# Patient Record
Sex: Male | Born: 1965 | Race: White | Hispanic: No | Marital: Single | Smoking: Never smoker
Health system: Southern US, Community
[De-identification: ages and names within clinical notes are randomized; demographics above are authoritative.]

## PROBLEM LIST (undated history)

## (undated) DIAGNOSIS — I5042 Chronic combined systolic (congestive) and diastolic (congestive) heart failure: Secondary | ICD-10-CM

## (undated) DIAGNOSIS — J90 Pleural effusion, not elsewhere classified: Secondary | ICD-10-CM

## (undated) DIAGNOSIS — K7460 Unspecified cirrhosis of liver: Secondary | ICD-10-CM

## (undated) DIAGNOSIS — I350 Nonrheumatic aortic (valve) stenosis: Secondary | ICD-10-CM

## (undated) DIAGNOSIS — R188 Other ascites: Secondary | ICD-10-CM

## (undated) HISTORY — PX: CARDIAC CATHETERIZATION: SHX172

## (undated) HISTORY — DX: Nonrheumatic aortic (valve) stenosis: I35.0

## (undated) HISTORY — PX: WRIST FRACTURE SURGERY: SHX121

## (undated) HISTORY — PX: WISDOM TOOTH EXTRACTION: SHX21

---

## 2011-09-24 ENCOUNTER — Ambulatory Visit: Payer: Self-pay | Admitting: Family Medicine

## 2011-09-24 VITALS — BP 115/77 | HR 72 | Temp 97.6°F | Resp 16 | Ht 71.0 in | Wt 276.0 lb

## 2011-09-24 DIAGNOSIS — Z0000 Encounter for general adult medical examination without abnormal findings: Secondary | ICD-10-CM

## 2011-09-24 DIAGNOSIS — Z0289 Encounter for other administrative examinations: Secondary | ICD-10-CM

## 2011-09-24 NOTE — Progress Notes (Signed)
@[email or web address] @  Patient ID: Steve Richards MRN: [id number], DOB: 1966/01/06 46 y.o. Date of Encounter: 09/24/2011, 7:15 PM  Primary Physician: No primary provider on file.  Chief Complaint: Physical (CPE)  HPI: 46 y.o. y/o male with history noted below here for CPE.  Doing well. No issues/complaints.  Review of Systems: Consitutional: No fever, chills, fatigue, night sweats, lymphadenopathy, or weight changes. Eyes: No visual changes, eye redness, or discharge. ENT/Mouth: Ears: No otalgia, tinnitus, hearing loss, discharge. Nose: No congestion, rhinorrhea, sinus pain, or epistaxis. Throat: No sore throat, post nasal drip, or teeth pain. Cardiovascular: No CP, palpitations, diaphoresis, DOE, edema, orthopnea, PND. Respiratory: No cough, hemoptysis, SOB, or wheezing. Gastrointestinal: No anorexia, dysphagia, reflux, pain, nausea, vomiting, hematemesis, diarrhea, constipation, BRBPR, or melena. Genitourinary: No dysuria, frequency, urgency, hematuria, incontinence, nocturia, decreased urinary stream, discharge, impotence, or testicular pain/masses. Musculoskeletal: No decreased ROM, myalgias, stiffness, joint swelling, or weakness. Skin: No rash, erythema, lesion changes, pain, warmth, jaundice, or pruritis. Neurological: No headache, dizziness, syncope, seizures, tremors, memory loss, coordination problems, or paresthesias. Psychological: No anxiety, depression, hallucinations, SI/HI. Endocrine: No fatigue, polydipsia, polyphagia, polyuria, or known diabetes. All other systems were reviewed and are otherwise negative.  No past medical history on file.   No past surgical history on file.  Home Meds:  Prior to Admission medications   Medication Sig Start Date End Date Taking? Authorizing Provider  Multiple Vitamin (MULTIVITAMIN) tablet Take 1 tablet by mouth daily.   Yes Historical Provider, MD    Allergies: No Known Allergies  History   Social History  . Marital Status: Single   Spouse Name: N/A    Number of Children: N/A  . Years of Education: N/A   Occupational History  . Not on file.   Social History Main Topics  . Smoking status: Never Smoker   . Smokeless tobacco: Not on file  . Alcohol Use: Not on file  . Drug Use: Not on file  . Sexually Active: Not on file   Other Topics Concern  . Not on file   Social History Narrative  . No narrative on file    No family history on file.  Physical Exam: Blood pressure 115/77, pulse 72, temperature 97.6 F (36.4 C), temperature source Oral, resp. rate 16, height 5' 11"  (1.803 m), weight 276 lb (125.193 kg), SpO2 95.00%.  General: Well developed, well nourished, in no acute distress. HEENT: Normocephalic, atraumatic. Conjunctiva pink, sclera non-icteric. Pupils 2 mm constricting to 1 mm, round, regular, and equally reactive to light and accomodation. EOMI. Internal auditory canal clear. TMs with good cone of light and without pathology. Nasal mucosa pink. Nares are without discharge. No sinus tenderness. Oral mucosa pink. Dentition good. Pharynx without exudate.   Neck: Supple. Trachea midline. No thyromegaly. Full ROM. No lymphadenopathy. Lungs: Clear to auscultation bilaterally without wheezes, rales, or rhonchi. Breathing is of normal effort and unlabored. Cardiovascular: RRR with S1 S2. No murmurs, rubs, or gallops appreciated. Distal pulses 2+ symmetrically. No carotid or abdominal bruits. Abdomen: Soft, non-tender, non-distended with normoactive bowel sounds. No hepatosplenomegaly or masses. No rebound/guarding. No CVA tenderness. Without hernias.   Genitourinary:  circumcised male. No penile lesions. Testes descended bilaterally, and smooth without tenderness or masses.  Musculoskeletal: Full range of motion and 5/5 strength throughout. Without swelling, atrophy, tenderness, crepitus, or warmth. Extremities without clubbing, cyanosis, or edema. Calves supple. Skin: Warm and moist without erythema,  ecchymosis, wounds, or rash. Neuro: A+Ox3. CN II-XII grossly intact. Moves all extremities spontaneously. Full sensation throughout. Normal  gait. DTR 2+ throughout upper and lower extremities. Finger to nose intact. Psych:  Responds to questions appropriately with a normal affect.   No results found for this or any previous visit.   Assessment/Plan:  Overweight, otherwise normal.  47 y.o. y/o health male here for CPE -  Signed, [name], MD 09/24/2011 7:15 PM

## 2012-05-19 ENCOUNTER — Ambulatory Visit: Payer: Self-pay | Admitting: Family Medicine

## 2019-06-17 ENCOUNTER — Other Ambulatory Visit: Payer: Self-pay | Admitting: Surgery

## 2019-06-17 ENCOUNTER — Other Ambulatory Visit (HOSPITAL_COMMUNITY): Payer: Self-pay | Admitting: Surgery

## 2019-06-17 DIAGNOSIS — J90 Pleural effusion, not elsewhere classified: Secondary | ICD-10-CM

## 2019-06-23 ENCOUNTER — Other Ambulatory Visit (HOSPITAL_COMMUNITY)
Admission: RE | Admit: 2019-06-23 | Discharge: 2019-06-23 | Disposition: A | Discharge status: Home / Self Care | Payer: 59 | Source: Ambulatory Visit | Attending: Surgery | Admitting: Surgery

## 2019-06-23 DIAGNOSIS — Z20822 Contact with and (suspected) exposure to covid-19: Secondary | ICD-10-CM | POA: Insufficient documentation

## 2019-06-23 DIAGNOSIS — Z01812 Encounter for preprocedural laboratory examination: Secondary | ICD-10-CM | POA: Insufficient documentation

## 2019-06-23 LAB — SARS CORONAVIRUS 2 (TAT 6-24 HRS): SARS Coronavirus 2: NEGATIVE

## 2019-06-25 ENCOUNTER — Other Ambulatory Visit: Payer: Self-pay

## 2019-06-25 ENCOUNTER — Other Ambulatory Visit (HOSPITAL_COMMUNITY): Payer: Self-pay | Admitting: Radiology

## 2019-06-25 ENCOUNTER — Ambulatory Visit (HOSPITAL_COMMUNITY)
Admission: RE | Admit: 2019-06-25 | Discharge: 2019-06-25 | Disposition: A | Discharge status: Home / Self Care | Payer: 59 | Source: Ambulatory Visit | Attending: Radiology | Admitting: Radiology

## 2019-06-25 ENCOUNTER — Ambulatory Visit (HOSPITAL_COMMUNITY)
Admission: RE | Admit: 2019-06-25 | Discharge: 2019-06-25 | Disposition: A | Discharge status: Home / Self Care | Payer: 59 | Source: Ambulatory Visit | Attending: Surgery | Admitting: Surgery

## 2019-06-25 DIAGNOSIS — J90 Pleural effusion, not elsewhere classified: Secondary | ICD-10-CM | POA: Diagnosis present

## 2019-06-25 HISTORY — PX: IR THORACENTESIS ASP PLEURAL SPACE W/IMG GUIDE: IMG5380

## 2019-06-25 LAB — GRAM STAIN

## 2019-06-25 LAB — LACTATE DEHYDROGENASE, PLEURAL OR PERITONEAL FLUID: LD, Fluid: 71 U/L — ABNORMAL HIGH (ref 3–23)

## 2019-06-25 LAB — BODY FLUID CELL COUNT WITH DIFFERENTIAL
Eos, Fluid: 0 %
Lymphs, Fluid: 45 %
Monocyte-Macrophage-Serous Fluid: 35 % — ABNORMAL LOW (ref 50–90)
Neutrophil Count, Fluid: 19 % (ref 0–25)
Total Nucleated Cell Count, Fluid: 100 cu mm (ref 0–1000)

## 2019-06-25 LAB — GLUCOSE, PLEURAL OR PERITONEAL FLUID: Glucose, Fluid: 147 mg/dL

## 2019-06-25 LAB — PROTEIN, PLEURAL OR PERITONEAL FLUID: Total protein, fluid: <3 g/dL

## 2019-06-25 MED ORDER — LIDOCAINE HCL 1 % IJ SOLN
INTRAMUSCULAR | Status: DC | PRN
  Administered 2019-06-25: 10 mL

## 2019-06-25 MED ORDER — LIDOCAINE HCL 1 % IJ SOLN
INTRAMUSCULAR | Status: AC
  Filled 2019-06-25: qty 20

## 2019-06-25 NOTE — Procedures (Signed)
Ultrasound-guided diagnostic and therapeutic right thoracentesis performed yielding 1.5 liters of yellow fluid. No immediate complications. Follow-up chest x-ray pending. A portion of the fluid was sent to the lab for preordered studies. EBL none.

## 2019-06-25 NOTE — Sedation Documentation (Signed)
Mr Dibello came to radiology today to have right thoracentesis.  Labs on pleural fluid sent to lab under the name of peritoneal fluid, I attempted to change and cancel labs to name correctly.  Lab eventually had to manually change under their epic screen.  Labs should read as pleural fluid.  jkc

## 2019-06-26 LAB — CYTOLOGY - NON PAP

## 2019-06-30 LAB — CULTURE, BODY FLUID W GRAM STAIN -BOTTLE: Culture: NO GROWTH

## 2019-07-15 ENCOUNTER — Encounter: Payer: Self-pay | Admitting: Surgery

## 2019-07-15 DIAGNOSIS — K7460 Unspecified cirrhosis of liver: Secondary | ICD-10-CM

## 2019-07-15 DIAGNOSIS — R188 Other ascites: Secondary | ICD-10-CM

## 2019-07-15 DIAGNOSIS — J90 Pleural effusion, not elsewhere classified: Secondary | ICD-10-CM

## 2019-07-15 DIAGNOSIS — R011 Cardiac murmur, unspecified: Secondary | ICD-10-CM

## 2019-07-15 DIAGNOSIS — R161 Splenomegaly, not elsewhere classified: Secondary | ICD-10-CM

## 2019-07-15 DIAGNOSIS — N2889 Other specified disorders of kidney and ureter: Secondary | ICD-10-CM | POA: Insufficient documentation

## 2019-07-15 HISTORY — DX: Unspecified cirrhosis of liver: K74.60

## 2019-07-15 HISTORY — DX: Pleural effusion, not elsewhere classified: J90

## 2019-07-15 HISTORY — DX: Other ascites: R18.8

## 2019-07-15 HISTORY — DX: Other specified disorders of kidney and ureter: N28.89

## 2019-07-15 HISTORY — DX: Cardiac murmur, unspecified: R01.1

## 2019-07-15 HISTORY — DX: Splenomegaly, not elsewhere classified: R16.1

## 2019-07-21 ENCOUNTER — Inpatient Hospital Stay (HOSPITAL_COMMUNITY): Payer: 59

## 2019-07-21 ENCOUNTER — Encounter (HOSPITAL_COMMUNITY): Payer: Self-pay | Admitting: Emergency Medicine

## 2019-07-21 ENCOUNTER — Other Ambulatory Visit: Payer: Self-pay

## 2019-07-21 ENCOUNTER — Inpatient Hospital Stay (HOSPITAL_COMMUNITY)
Admission: EM | Admit: 2019-07-21 | Discharge: 2019-08-17 | DRG: 216 | Disposition: A | Discharge status: Home Health | Payer: 59 | Attending: Surgery | Admitting: Surgery

## 2019-07-21 ENCOUNTER — Emergency Department (HOSPITAL_COMMUNITY): Payer: 59

## 2019-07-21 DIAGNOSIS — Z6841 Body Mass Index (BMI) 40.0 and over, adult: Secondary | ICD-10-CM

## 2019-07-21 DIAGNOSIS — U071 COVID-19: Secondary | ICD-10-CM | POA: Diagnosis present

## 2019-07-21 DIAGNOSIS — I878 Other specified disorders of veins: Secondary | ICD-10-CM | POA: Diagnosis present

## 2019-07-21 DIAGNOSIS — R0489 Hemorrhage from other sites in respiratory passages: Secondary | ICD-10-CM | POA: Diagnosis not present

## 2019-07-21 DIAGNOSIS — I97130 Postprocedural heart failure following cardiac surgery: Secondary | ICD-10-CM

## 2019-07-21 DIAGNOSIS — Z8262 Family history of osteoporosis: Secondary | ICD-10-CM

## 2019-07-21 DIAGNOSIS — E872 Acidosis: Secondary | ICD-10-CM | POA: Diagnosis not present

## 2019-07-21 DIAGNOSIS — D688 Other specified coagulation defects: Secondary | ICD-10-CM | POA: Diagnosis not present

## 2019-07-21 DIAGNOSIS — Z951 Presence of aortocoronary bypass graft: Secondary | ICD-10-CM

## 2019-07-21 DIAGNOSIS — K7460 Unspecified cirrhosis of liver: Secondary | ICD-10-CM | POA: Diagnosis present

## 2019-07-21 DIAGNOSIS — I5030 Unspecified diastolic (congestive) heart failure: Secondary | ICD-10-CM

## 2019-07-21 DIAGNOSIS — R778 Other specified abnormalities of plasma proteins: Secondary | ICD-10-CM | POA: Diagnosis present

## 2019-07-21 DIAGNOSIS — Z953 Presence of xenogenic heart valve: Secondary | ICD-10-CM

## 2019-07-21 DIAGNOSIS — Z01810 Encounter for preprocedural cardiovascular examination: Secondary | ICD-10-CM | POA: Diagnosis not present

## 2019-07-21 DIAGNOSIS — I350 Nonrheumatic aortic (valve) stenosis: Principal | ICD-10-CM

## 2019-07-21 DIAGNOSIS — R739 Hyperglycemia, unspecified: Secondary | ICD-10-CM | POA: Diagnosis present

## 2019-07-21 DIAGNOSIS — E1165 Type 2 diabetes mellitus with hyperglycemia: Secondary | ICD-10-CM | POA: Diagnosis present

## 2019-07-21 DIAGNOSIS — D62 Acute posthemorrhagic anemia: Secondary | ICD-10-CM | POA: Diagnosis not present

## 2019-07-21 DIAGNOSIS — L03116 Cellulitis of left lower limb: Secondary | ICD-10-CM | POA: Diagnosis present

## 2019-07-21 DIAGNOSIS — I959 Hypotension, unspecified: Secondary | ICD-10-CM | POA: Diagnosis not present

## 2019-07-21 DIAGNOSIS — I5042 Chronic combined systolic (congestive) and diastolic (congestive) heart failure: Secondary | ICD-10-CM | POA: Diagnosis not present

## 2019-07-21 DIAGNOSIS — Z79899 Other long term (current) drug therapy: Secondary | ICD-10-CM

## 2019-07-21 DIAGNOSIS — I5021 Acute systolic (congestive) heart failure: Secondary | ICD-10-CM | POA: Diagnosis not present

## 2019-07-21 DIAGNOSIS — I429 Cardiomyopathy, unspecified: Secondary | ICD-10-CM | POA: Diagnosis present

## 2019-07-21 DIAGNOSIS — E039 Hypothyroidism, unspecified: Secondary | ICD-10-CM | POA: Diagnosis present

## 2019-07-21 DIAGNOSIS — I272 Pulmonary hypertension, unspecified: Secondary | ICD-10-CM | POA: Diagnosis present

## 2019-07-21 DIAGNOSIS — I9789 Other postprocedural complications and disorders of the circulatory system, not elsewhere classified: Secondary | ICD-10-CM | POA: Diagnosis not present

## 2019-07-21 DIAGNOSIS — I872 Venous insufficiency (chronic) (peripheral): Secondary | ICD-10-CM | POA: Diagnosis present

## 2019-07-21 DIAGNOSIS — R609 Edema, unspecified: Secondary | ICD-10-CM | POA: Diagnosis not present

## 2019-07-21 DIAGNOSIS — I509 Heart failure, unspecified: Secondary | ICD-10-CM

## 2019-07-21 DIAGNOSIS — R7989 Other specified abnormal findings of blood chemistry: Secondary | ICD-10-CM | POA: Diagnosis present

## 2019-07-21 DIAGNOSIS — E6609 Other obesity due to excess calories: Secondary | ICD-10-CM | POA: Diagnosis present

## 2019-07-21 DIAGNOSIS — J939 Pneumothorax, unspecified: Secondary | ICD-10-CM

## 2019-07-21 DIAGNOSIS — R9341 Abnormal radiologic findings on diagnostic imaging of renal pelvis, ureter, or bladder: Secondary | ICD-10-CM | POA: Diagnosis present

## 2019-07-21 DIAGNOSIS — I5043 Acute on chronic combined systolic (congestive) and diastolic (congestive) heart failure: Secondary | ICD-10-CM | POA: Diagnosis present

## 2019-07-21 DIAGNOSIS — J90 Pleural effusion, not elsewhere classified: Secondary | ICD-10-CM

## 2019-07-21 DIAGNOSIS — D696 Thrombocytopenia, unspecified: Secondary | ICD-10-CM | POA: Diagnosis not present

## 2019-07-21 DIAGNOSIS — L02416 Cutaneous abscess of left lower limb: Secondary | ICD-10-CM | POA: Diagnosis present

## 2019-07-21 DIAGNOSIS — E873 Alkalosis: Secondary | ICD-10-CM | POA: Diagnosis not present

## 2019-07-21 DIAGNOSIS — Z87442 Personal history of urinary calculi: Secondary | ICD-10-CM

## 2019-07-21 DIAGNOSIS — N4889 Other specified disorders of penis: Secondary | ICD-10-CM | POA: Diagnosis present

## 2019-07-21 DIAGNOSIS — Z954 Presence of other heart-valve replacement: Secondary | ICD-10-CM | POA: Diagnosis not present

## 2019-07-21 DIAGNOSIS — N3289 Other specified disorders of bladder: Secondary | ICD-10-CM | POA: Diagnosis not present

## 2019-07-21 DIAGNOSIS — I5082 Biventricular heart failure: Secondary | ICD-10-CM | POA: Diagnosis present

## 2019-07-21 DIAGNOSIS — Z125 Encounter for screening for malignant neoplasm of prostate: Secondary | ICD-10-CM

## 2019-07-21 DIAGNOSIS — R011 Cardiac murmur, unspecified: Secondary | ICD-10-CM | POA: Diagnosis present

## 2019-07-21 DIAGNOSIS — L97928 Non-pressure chronic ulcer of unspecified part of left lower leg with other specified severity: Secondary | ICD-10-CM | POA: Diagnosis present

## 2019-07-21 DIAGNOSIS — Z952 Presence of prosthetic heart valve: Secondary | ICD-10-CM

## 2019-07-21 HISTORY — DX: Cutaneous abscess of left lower limb: L02.416

## 2019-07-21 HISTORY — DX: Pleural effusion, not elsewhere classified: J90

## 2019-07-21 HISTORY — DX: Other ascites: R18.8

## 2019-07-21 HISTORY — DX: Chronic combined systolic (congestive) and diastolic (congestive) heart failure: I50.42

## 2019-07-21 HISTORY — DX: Unspecified cirrhosis of liver: K74.60

## 2019-07-21 HISTORY — DX: Morbid (severe) obesity due to excess calories: E66.01

## 2019-07-21 HISTORY — DX: Hyperglycemia, unspecified: R73.9

## 2019-07-21 HISTORY — DX: Nonrheumatic aortic (valve) stenosis: I35.0

## 2019-07-21 LAB — LIPID PANEL
Cholesterol: 128 mg/dL (ref 0–200)
HDL: 37 mg/dL — ABNORMAL LOW (ref >40)
LDL Cholesterol: 74 mg/dL (ref 0–99)
Total CHOL/HDL Ratio: 3.5 RATIO
Triglycerides: 87 mg/dL (ref <150)
VLDL: 17 mg/dL (ref 0–40)

## 2019-07-21 LAB — D-DIMER, QUANTITATIVE: D-Dimer, Quant: 6.01 ug/mL-FEU — ABNORMAL HIGH (ref 0.00–0.50)

## 2019-07-21 LAB — CBC
HCT: 46 % (ref 39.0–52.0)
Hemoglobin: 14.5 g/dL (ref 13.0–17.0)
MCH: 29.7 pg (ref 26.0–34.0)
MCHC: 31.5 g/dL (ref 30.0–36.0)
MCV: 94.3 fL (ref 80.0–100.0)
Platelets: 225 10*3/uL (ref 150–400)
RBC: 4.88 MIL/uL (ref 4.22–5.81)
RDW: 16.5 % — ABNORMAL HIGH (ref 11.5–15.5)
WBC: 8.9 10*3/uL (ref 4.0–10.5)
nRBC: 0 % (ref 0.0–0.2)

## 2019-07-21 LAB — RAPID URINE DRUG SCREEN, HOSP PERFORMED
Amphetamines: NOT DETECTED
Barbiturates: NOT DETECTED
Benzodiazepines: NOT DETECTED
Cocaine: NOT DETECTED
Opiates: NOT DETECTED
Tetrahydrocannabinol: NOT DETECTED

## 2019-07-21 LAB — FERRITIN: Ferritin: 235 ng/mL (ref 24–336)

## 2019-07-21 LAB — BASIC METABOLIC PANEL
Anion gap: 12 (ref 5–15)
BUN: 14 mg/dL (ref 6–20)
CO2: 26 mmol/L (ref 22–32)
Calcium: 8.6 mg/dL — ABNORMAL LOW (ref 8.9–10.3)
Chloride: 98 mmol/L (ref 98–111)
Creatinine, Ser: 0.93 mg/dL (ref 0.61–1.24)
GFR calc Af Amer: >60 mL/min (ref >60)
GFR calc non Af Amer: >60 mL/min (ref >60)
Glucose, Bld: 153 mg/dL — ABNORMAL HIGH (ref 70–99)
Potassium: 4.2 mmol/L (ref 3.5–5.1)
Sodium: 136 mmol/L (ref 135–145)

## 2019-07-21 LAB — ECHOCARDIOGRAM COMPLETE
Height: 72 in
Weight: 4960 oz

## 2019-07-21 LAB — TROPONIN I (HIGH SENSITIVITY)
Troponin I (High Sensitivity): 47 ng/L — ABNORMAL HIGH (ref <18)
Troponin I (High Sensitivity): 40 ng/L — ABNORMAL HIGH (ref <18)

## 2019-07-21 LAB — C-REACTIVE PROTEIN: CRP: 9.8 mg/dL — ABNORMAL HIGH (ref <1.0)

## 2019-07-21 LAB — LACTATE DEHYDROGENASE: LDH: 317 U/L — ABNORMAL HIGH (ref 98–192)

## 2019-07-21 LAB — BRAIN NATRIURETIC PEPTIDE: B Natriuretic Peptide: 1142.6 pg/mL — ABNORMAL HIGH (ref 0.0–100.0)

## 2019-07-21 LAB — SARS CORONAVIRUS 2 BY RT PCR (HOSPITAL ORDER, PERFORMED IN ~~LOC~~ HOSPITAL LAB): SARS Coronavirus 2: POSITIVE — AB

## 2019-07-21 LAB — TSH: TSH: 14.146 u[IU]/mL — ABNORMAL HIGH (ref 0.350–4.500)

## 2019-07-21 LAB — T4, FREE: Free T4: 0.65 ng/dL (ref 0.61–1.12)

## 2019-07-21 LAB — FIBRINOGEN: Fibrinogen: 528 mg/dL — ABNORMAL HIGH (ref 210–475)

## 2019-07-21 LAB — HIV ANTIBODY (ROUTINE TESTING W REFLEX): HIV Screen 4th Generation wRfx: NONREACTIVE

## 2019-07-21 LAB — CBG MONITORING, ED
Glucose-Capillary: 88 mg/dL (ref 70–99)
Glucose-Capillary: 148 mg/dL — ABNORMAL HIGH (ref 70–99)

## 2019-07-21 LAB — LACTIC ACID, PLASMA: Lactic Acid, Venous: 1.9 mmol/L (ref 0.5–1.9)

## 2019-07-21 MED ORDER — POLYETHYLENE GLYCOL 3350 17 G PO PACK: 17.0000 g | PACK | Freq: Every day | ORAL | Status: DC | PRN

## 2019-07-21 MED ORDER — DOCUSATE SODIUM 100 MG PO CAPS
100.0000 mg | ORAL_CAPSULE | Freq: Two times a day (BID) | ORAL | Status: DC
  Administered 2019-07-21 – 2019-08-05 (×30): 100 mg via ORAL
  Filled 2019-07-21 (×31): qty 1

## 2019-07-21 MED ORDER — VANCOMYCIN HCL 1250 MG/250ML IV SOLN
1250.0000 mg | Freq: Two times a day (BID) | INTRAVENOUS | Status: DC
  Administered 2019-07-21: 1250 mg via INTRAVENOUS
  Filled 2019-07-21 (×2): qty 250

## 2019-07-21 MED ORDER — MORPHINE SULFATE (PF) 2 MG/ML IV SOLN: 2.0000 mg | INTRAVENOUS | Status: DC | PRN

## 2019-07-21 MED ORDER — ONDANSETRON HCL 4 MG/2ML IJ SOLN: 4.0000 mg | Freq: Four times a day (QID) | INTRAMUSCULAR | Status: DC | PRN

## 2019-07-21 MED ORDER — SODIUM CHLORIDE 0.9 % IV SOLN: 250.0000 mL | INTRAVENOUS | Status: DC | PRN

## 2019-07-21 MED ORDER — ONDANSETRON HCL 4 MG PO TABS: 4.0000 mg | ORAL_TABLET | Freq: Four times a day (QID) | ORAL | Status: DC | PRN

## 2019-07-21 MED ORDER — SODIUM CHLORIDE 0.9% FLUSH: 3.0000 mL | INTRAVENOUS | Status: DC | PRN

## 2019-07-21 MED ORDER — INSULIN ASPART 100 UNIT/ML ~~LOC~~ SOLN
0.0000 [IU] | Freq: Three times a day (TID) | SUBCUTANEOUS | Status: DC
  Administered 2019-07-22: 3 [IU] via SUBCUTANEOUS
  Administered 2019-07-23: 2 [IU] via SUBCUTANEOUS
  Administered 2019-07-23: 3 [IU] via SUBCUTANEOUS
  Administered 2019-07-24: 2 [IU] via SUBCUTANEOUS
  Administered 2019-07-24: 3 [IU] via SUBCUTANEOUS

## 2019-07-21 MED ORDER — ACETAMINOPHEN 325 MG PO TABS
650.0000 mg | ORAL_TABLET | Freq: Four times a day (QID) | ORAL | Status: DC | PRN
  Administered 2019-08-01: 650 mg via ORAL
  Filled 2019-07-21: qty 2

## 2019-07-21 MED ORDER — ENOXAPARIN SODIUM 80 MG/0.8ML ~~LOC~~ SOLN
70.0000 mg | SUBCUTANEOUS | Status: DC
  Administered 2019-07-21 – 2019-07-24 (×4): 70 mg via SUBCUTANEOUS
  Filled 2019-07-21: qty 0.7
  Filled 2019-07-21 (×3): qty 0.8

## 2019-07-21 MED ORDER — FUROSEMIDE 10 MG/ML IJ SOLN
40.0000 mg | Freq: Two times a day (BID) | INTRAMUSCULAR | Status: DC
  Administered 2019-07-21 – 2019-07-30 (×14): 40 mg via INTRAVENOUS
  Filled 2019-07-21 (×15): qty 4

## 2019-07-21 MED ORDER — HYDROCODONE-ACETAMINOPHEN 5-325 MG PO TABS: 1.0000 | ORAL_TABLET | ORAL | Status: DC | PRN

## 2019-07-21 MED ORDER — BISACODYL 5 MG PO TBEC: 5.0000 mg | DELAYED_RELEASE_TABLET | Freq: Every day | ORAL | Status: DC | PRN

## 2019-07-21 MED ORDER — SODIUM CHLORIDE 0.9% FLUSH
3.0000 mL | Freq: Two times a day (BID) | INTRAVENOUS | Status: DC
  Administered 2019-07-21 – 2019-07-29 (×12): 3 mL via INTRAVENOUS

## 2019-07-21 MED ORDER — VANCOMYCIN HCL 2000 MG/400ML IV SOLN
2000.0000 mg | Freq: Once | INTRAVENOUS | Status: AC
  Administered 2019-07-21: 2000 mg via INTRAVENOUS
  Filled 2019-07-21: qty 400

## 2019-07-21 MED ORDER — ASPIRIN EC 81 MG PO TBEC
81.0000 mg | DELAYED_RELEASE_TABLET | Freq: Every day | ORAL | Status: DC
  Administered 2019-07-21 – 2019-08-05 (×15): 81 mg via ORAL
  Filled 2019-07-21 (×15): qty 1

## 2019-07-21 MED ORDER — HYDRALAZINE HCL 20 MG/ML IJ SOLN: 5.0000 mg | INTRAMUSCULAR | Status: DC | PRN

## 2019-07-21 MED ORDER — FUROSEMIDE 10 MG/ML IJ SOLN
40.0000 mg | Freq: Once | INTRAMUSCULAR | Status: AC
  Administered 2019-07-21: 40 mg via INTRAVENOUS
  Filled 2019-07-21: qty 4

## 2019-07-21 MED ORDER — ACETAMINOPHEN 650 MG RE SUPP: 650.0000 mg | Freq: Four times a day (QID) | RECTAL | Status: DC | PRN

## 2019-07-21 NOTE — ED Notes (Signed)
Dinner ordered 

## 2019-07-21 NOTE — Progress Notes (Signed)
Pharmacy Antibiotic Note  Steve Richards is a 54 y.o. male admitted on 07/21/2019 with cellulitis.  Pharmacy has been consulted for vancomycin dosing.  Plan: Vancomycin 2000 mg IV x 1, then 1250 mg every 12 hours Monitor renal function, Cx and clinical progression, LOT Vancomycin trough at steady state  Height: 6' (182.9 cm) Weight: (!) 140.6 kg (310 lb) IBW/kg (Calculated) : 77.6  Temp (24hrs), Avg:98.6 F (37 C), Min:98.6 F (37 C), Max:98.6 F (37 C)  Recent Labs  Lab 07/21/19 0820  WBC 8.9  CREATININE 0.93    Estimated Creatinine Clearance: 133.6 mL/min (by C-G formula based on SCr of 0.93 mg/dL).    No Known Allergies  [name], PharmD Clinical Pharmacist ED Pharmacist Phone # [phone] 07/21/2019 11:35 AM

## 2019-07-21 NOTE — ED Triage Notes (Signed)
Pt endorses SOB that worsened today. Also reports slow healing wounds to legs with weeping. States he is on lasix and abx. Reports some chest tightness with exertion. Recent CT in January showed ascites, fluid in lung and liver cirrhosis.

## 2019-07-21 NOTE — H&P (Signed)
History and Physical    Steve Richards TKW:[id number] DOB: [birth year] DOA: 07/21/2019  PCP:  None Consultants:  Hassell Done - surgery Patient coming from:  Home - lives alone; NOK: Juanetta Beets, friend, [phone]; sister, Jamani Bearce, [phone]  Chief Complaint: SOB, LE edema  HPI: Steve Richards is a 54 y.o. male with medical history significant of R pleural effusion in April 2021 presenting with SOB and LE edema.  Last time he was in the hospital was when he was 54yo.  Recently, he had a thoracentesis.  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  He saw the [employer] doctor, ?hernia, sent to Dr. Hassell Done (surgery).  CT was ordered and it showed a moderate pleural effusion in February.  He had significant mostly lower body edema.  He is not sure why he has the edema.  Thoracentesis was done with removal of about 1.5L fluid and he was started on diuretics.  This has been a very slow process since this has happened through [employer] but he has now been released from that.  He saw Dr. Hassell Done to get the results from the thora on Thursday - no malignancy and unremarkable evaluation (no PNA or cancer).  They discussed his LLE wound that isn't helping - so much fluid that he can't bend his knee and hit his leg on the concrete on April 30.  It was originally weepy with copious amounts of fluid.  He went to [facility] in Clinic and was started on Keflex and wrapped with gauze.  He started the antibiotics over the weekend along with Lasix.  The Lasix led to diuresis with the initial dose, but less now.  The antibiotics did not appear to make a different.  No fever.  Significant DOE, which has worsened since November.  He did have orthopnea prior to the thoracentesis, but he has not been lying flat since then.  He did feel significantly better after the thoracentesis but has slowly gotten worse since then.  Occasional cough, dry.  Rare  wheezing.    ED Course:  Subacute LE edema, fluid overloaded.  On Lasix and still overloaded.  Also with poor wound healing; placed on Keflex a few days ago without improvement.  Giving Vanc, Lasix.    Review of Systems: As per HPI; otherwise review of systems reviewed and negative.   Ambulatory Status:  Ambulates without assistance  COVID Vaccine Status:  None  Past Medical History:  Diagnosis Date  . Cirrhosis of liver with ascites ([place])   . Pleural effusion     Past Surgical History:  Procedure Laterality Date  . IR THORACENTESIS ASP PLEURAL SPACE W/IMG GUIDE  06/25/2019    Social History   Socioeconomic History  . Marital status: Single    Spouse name: Not on file  . Number of children: Not on file  . Years of education: Not on file  . Highest education level: Not on file  Occupational History  . 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 Smoking status: Never Smoker  . Smokeless tobacco: Never Used  Substance and Sexual Activity  . Alcohol use: Yes    Comment: rarely  . Drug use: Never  . Sexual activity: Not on file  Other Topics Concern  . Not on file  Social History Narrative  . Not on file   Social Determinants of Health   Financial Resource Strain:   .  Difficulty of Paying Living Expenses:   Food Insecurity:   . Worried About Charity fundraiser in the Last Year:   . Arboriculturist in the Last Year:   Transportation Needs:   . Film/video editor (Medical):   Marland Kitchen Lack of Transportation (Non-Medical):   Physical Activity:   . Days of Exercise per Week:   . Minutes of Exercise per Session:   Stress:   . Feeling of Stress :   Social Connections:   . Frequency of Communication with Friends and Family:   . Frequency of Social Gatherings with Friends and Family:   . Attends Religious Services:   . Active Member of Clubs or Organizations:   . Attends Archivist Meetings:   Marland Kitchen Marital Status:   Intimate Partner  Violence:   . Fear of Current or Ex-Partner:   . Emotionally Abused:   Marland Kitchen Physically Abused:   . Sexually Abused:     No Known Allergies  Family History  Problem Relation Age of Onset  . Osteoporosis Mother   . Other Father        died in a hurricane    Prior to Admission medications   Medication Sig Start Date End Date Taking? Authorizing Provider  Multiple Vitamin (MULTIVITAMIN) tablet Take 1 tablet by mouth daily.    [provider]    Physical Exam: Vitals:   07/21/19 1145 07/21/19 1200 07/21/19 1215 07/21/19 1230  BP: 108/78 93/67 98/74  102/67  Pulse: 79 75 73 73  Resp: 20 17 (!) 22 (!) 23  Temp:      TempSrc:      SpO2: 93% 93% 91% 94%  Weight:      Height:         . General:  Appears calm and comfortable and is NAD . Eyes:  PERRL, EOMI, normal lids, iris . ENT:  grossly normal hearing, lips & tongue, mmm; appropriate dentition . Neck:  no LAD, masses or thyromegaly; no JVD . Cardiovascular:  RRR, no r/g, 4/6 systolic murmur.  Marland Kitchen Respiratory:   CTA bilaterally with no wheezes/rales/rhonchi.  Normal respiratory effort. . Abdomen:  soft, NT, ND, NABS; mild abdominal wall edema +/- ascites vs. Abdominal obesity . GU: marked scrotal edema . Skin:  Wound on left anterior lower leg with mild serous drainage; surrounding marked erythema and milder edema (B LE edema c/w stasis but with additional LLE edema) extending to the knee.  Mild L foot skin thickening with developing elephantiasis       . Musculoskeletal:  grossly normal tone BUE/BLE, good ROM, no bony abnormality . Psychiatric:  grossly normal mood and affect, speech fluent and appropriate, AOx3 . Neurologic:  CN 2-12 grossly intact, moves all extremities in coordinated fashion    Radiological Exams on Admission: DG Chest 2 View  Result Date: 07/21/2019 CLINICAL DATA:  Chest pain and shortness of breath EXAM: CHEST - 2 VIEW COMPARISON:  06/25/2018 FINDINGS: Congested appearance of vessels with  small pleural effusions. Cardiomegaly that is chronic. Negative aortic contours. IMPRESSION: Cardiomegaly with vascular congestion and small pleural effusions. Electronically Signed   By: Monte Fantasia M.D.   On: 07/21/2019 08:42    EKG: Independently reviewed.  NSR with rate 94; no evidence of acute ischemia   Labs on Admission: I have personally reviewed the available labs and imaging studies at the time of the admission.  Pertinent labs:   Glucose 153 HS troponin 47, 40 BNP 1142.6 Unremarkable CBC  Assessment/Plan Principal Problem:   New onset of congestive heart failure (HCC) Active Problems:   Hepatic cirrhosis (HCC)   Pleural effusion on right   Systolic ejection murmur   Cellulitis and abscess of left lower extremity   Hyperglycemia   Obesity, Class III, BMI 40-49.9 (morbid obesity) ([place])   New onset CHF -Patient without known medical history because he doesn't routinely see doctors presenting with worsening SOB -CXR consistent with vascular congestion without frank pulmonary edema -Markedly elevated BNP without known baseline -With elevated BNP and abnl CXR, acute decompensated CHF seems probable as diagnosis; most likely this is subacute diastolic CHF -Will admit to telemetry -Will request echocardiogram -Will start ASA -BP is borderline low and may decrease further with diuresis so will hold ACE/BB initiation for now -He is vastly volume overloaded, likely 30+ pounds -CHF order set utilized; may need CHF team consult but will hold until Echo results are available -Was given Lasix 40 mg x 1 in ER and will repeat with 40 mg IV BID -Continue [place] O2 for now -Normal kidney function at this time, will follow -Mildly elevated HS troponin is likely related to demand ischemia; doubt ACS based on symptoms  LLE cellulitis -Patient with injury to the lower leg remotely -As the edema has worsened, the weeping and his mobility have worsened -He was seen at the [facility] last week and given Keflex; he started taking it over the weekend without improvement -Currently with erythema to the entire left lower leg -He does have stasis with dermatitis but also appears to have a superimposed cellulitis -Normal WBC count -He was given Vanc in the ER; will continue for now given outpatient treatment failure -Admission in this patient is warranted due to:  -failure of outpatient antibiotic therapy (progression/no improvement after a minimum of 48 hours with adequate antibiotic regimen with first-generation cephalosporin or antistaph penicillin or PCN-allergic regimen or resistant organism regimen) -high-risk comorbid condition is indicated by symptomatic heart failure  Cirrhosis and pleural effusion by CT -1/15 CT abdomen/pelvis with large R pleural effusion; cardiomegaly with hepatic venous reflux (c/w CHF); liver cirrhosis with splenomegaly, ascites, and generalized subcutaneous edema; B renal calculi; scrotal edema; and diverticulosis -3/4 chest CT with moderate R pleural effusion and RML and RLL consolidations suggestive of asymmetric edema and/or PNA; aortic valve calcifications; cardiomegaly with LVH -Thoracentesis performed on 4/22 with pleural fluid c/w transudate -Will obtain RUQ [place] for further liver characterization, although most findings can likely be explained by CHF  Hypergylcemia -Glucose 153 -Will check A1c -Body habitus is suggestive of DM -Will cover with moderate-scale SSI for now  Morbid obesity Body mass index is 42.04 kg/m.   Note: This patient has been tested and is pending for the novel coronavirus COVID-19.    DVT prophylaxis: Lovenox  Code Status:  Full - confirmed with patient Family Communication: None present; he did not ask that I contact his sister at the time of admission Disposition Plan:  Home once clinically improved Consults called: TOC team/PT/Nutrition  Admission status: Admit - It is my clinical opinion  that admission to INPATIENT is reasonable and necessary because this patient will require at least 2 midnights in the hospital to treat this condition based on the medical complexity of the problems presented.  Given the aforementioned information, the predictability of an adverse outcome is felt to be significant.   Karmen Bongo MD [facility]   How to contact the [facility] Attending or Consulting provider [place] or covering  provider during after hours [place], for this patient?  1. Check the care team in [facility] and look for a) attending/consulting [facility] provider listed and b) the [facility] team listed 2. Log into [email or web address] and use 's universal password to access. If you do not have the password, please contact the hospital operator. 3. Locate the [facility] provider you are looking for under [facility] and page to a number that you can be directly reached. 4. If you still have difficulty reaching the provider, please page the [facility] (Director on Call) for the Hospitalists listed on amion for assistance.   07/21/2019, 2:10 PM

## 2019-07-21 NOTE — Progress Notes (Signed)
Patient is unexpectedly COVID-19 positive, new from his last test 4 weeks ago.  This appears to be an asymptomatic infection but will check COVID labs and he will be admitted to 5W.  Otherwise, his plan is not currently anticipated to change unless his COVID labs are markedly abnormal given no O2 requirement and negative CXR.    Also, his TSH is quite elevated.  Will check free T4 and he appears to need initiation of Synthroid.   Carlyon Shadow, M.D.

## 2019-07-21 NOTE — Consult Note (Addendum)
[facility] Nurse Consult Note: Reason for Consult: Consult requested for left leg cellulitis. Performed remotely after review of the photos in the EMR and progress notes.  Wound type: Left leg with generalized edema and erythremia which extends from toes to thigh.  Middle anterior calf with partial thickness wound, yellow and moist.  Mod amt yellow drainage weeping from leg.  Dressing procedure/placement/frequency: Topical treatment orders provided for bedside nurses to perform as follows to promote healing and reduce edema: Bedside nurse; change dressing to left leg Q day as follows to promote healing and reduce edema:  Apply xeroform gauze to open/weeping areas Q day, then cover with ABD pads. Apply kerlex in a spiral fashion, beginning from just behind toes to below knee, then apply ace wrap in the same manner.  Please re-consult if further assistance is needed.  Thank-you,  Julien Girt MSN, [place], [place], [place], [place]

## 2019-07-21 NOTE — Progress Notes (Signed)
  Echocardiogram 2D Echocardiogram has been performed.  Steve Richards 07/21/2019, 3:21 PM

## 2019-07-21 NOTE — ED Provider Notes (Signed)
[facility]ENC[facility] DEPARTMENT Provider Note   CSN: [id number] Arrival date & time: 07/21/19  0750     History Chief Complaint  Patient presents with  . Shortness of Breath  . Leg Swelling    Steve Richards is a 54 y.o. male.  HPI 54 year old male presents with 2 issues.  He is here for shortness of breath as well as leg infection.  States the shortness of breath has been progressive for a while.  He has diffuse edema including his abdomen, scrotum, and legs.  Has been on Lasix 40 mg once per day for the last week or so with no relief.  Scraped his leg on concrete a few weeks ago but now is noticing infection in that leg.  Over the past weekend he has been taking Keflex with no relief.  The redness is worsening.  No fevers.   Past Medical History:  Diagnosis Date  . Ascites     Patient Active Problem List   Diagnosis Date Noted  . Ascites 07/15/2019  . Hepatic cirrhosis ([place]) 07/15/2019  . Pleural effusion on right 07/15/2019  . Left kidney mass 07/15/2019  . Splenomegaly 07/15/2019  . Systolic ejection murmur 59/56/3875    Past Surgical History:  Procedure Laterality Date  . IR THORACENTESIS ASP PLEURAL SPACE W/IMG GUIDE  06/25/2019       No family history on file.  Social History   Tobacco Use  . Smoking status: Never Smoker  Substance Use Topics  . Alcohol use: Not on file  . Drug use: Not on file    Home Medications Prior to Admission medications   Medication Sig Start Date End Date Taking? Authorizing Provider  cephALEXin (KEFLEX) 500 MG capsule Take 500 mg by mouth every 8 (eight) hours. 07/10/19   [provider]  furosemide (LASIX) 40 MG tablet Take 40 mg by mouth daily. 07/15/19   [provider]  Multiple Vitamin (MULTIVITAMIN) tablet Take 1 tablet by mouth daily.    [provider]    Allergies    Patient has no known allergies.  Review of Systems   Review of Systems  Constitutional: Negative for  fever.  Respiratory: Positive for shortness of breath.   Cardiovascular: Positive for leg swelling. Negative for chest pain.  Gastrointestinal: Negative for abdominal pain.  Skin: Positive for color change and wound.  All other systems reviewed and are negative.   Physical Exam Updated Vital Signs BP 125/82   Pulse 95   Temp 98.6 F (37 C) (Oral)   Resp 18   Ht 6' (1.829 m)   Wt (!) 140.6 kg   SpO2 97%   BMI 42.04 kg/m   Physical Exam Vitals and nursing note reviewed.  Constitutional:      Appearance: He is well-developed. He is obese.  HENT:     Head: Normocephalic and atraumatic.     Right Ear: External ear normal.     Left Ear: External ear normal.     Nose: Nose normal.  Eyes:     General:        Right eye: No discharge.        Left eye: No discharge.  Cardiovascular:     Rate and Rhythm: Normal rate and regular rhythm.     Heart sounds: Normal heart sounds.  Pulmonary:     Effort: Pulmonary effort is normal.     Breath sounds: Decreased breath sounds (mild, at bases) present.  Abdominal:  Palpations: Abdomen is soft.     Tenderness: There is no abdominal tenderness.  Musculoskeletal:     Cervical back: Neck supple.     Right lower leg: Edema present.     Left lower leg: Edema present.     Comments: See picture for left leg. Small superficial wound to mid anterior lower leg. Diffuse erythema. No significant tenderness.   Skin:    General: Skin is warm and dry.  Neurological:     Mental Status: He is alert.  Psychiatric:        Mood and Affect: Mood is not anxious.        ED Results / Procedures / Treatments   Labs (all labs ordered are listed, but only abnormal results are displayed) Labs Reviewed  BASIC METABOLIC PANEL - Abnormal; Notable for the following components:      Result Value   Glucose, Bld 153 (*)    Calcium 8.6 (*)    All other components within normal limits  CBC - Abnormal; Notable for the following components:   RDW 16.5 (*)     All other components within normal limits  TROPONIN I (HIGH SENSITIVITY) - Abnormal; Notable for the following components:   Troponin I (High Sensitivity) 47 (*)    All other components within normal limits  CULTURE, BLOOD (ROUTINE X 2)  CULTURE, BLOOD (ROUTINE X 2)  SARS CORONAVIRUS 2 BY RT PCR (HOSPITAL ORDER, [facility])  BRAIN NATRIURETIC PEPTIDE  LACTIC ACID, PLASMA  LACTIC ACID, PLASMA  TROPONIN I (HIGH SENSITIVITY)    EKG EKG Interpretation  Date/Time:  Tuesday Jul 21 2019 07:56:14 EDT Ventricular Rate:  94 PR Interval:  174 QRS Duration: 98 QT Interval:  372 QTC Calculation: 465 R Axis:   146 Text Interpretation: Normal sinus rhythm Right axis deviation Abnormal ECG No old tracing to compare Confirmed by Sherwood Gambler [phone]) on 07/21/2019 10:46:18 AM   Radiology DG Chest 2 View  Result Date: 07/21/2019 CLINICAL DATA:  Chest pain and shortness of breath EXAM: CHEST - 2 VIEW COMPARISON:  06/25/2018 FINDINGS: Congested appearance of vessels with small pleural effusions. Cardiomegaly that is chronic. Negative aortic contours. IMPRESSION: Cardiomegaly with vascular congestion and small pleural effusions. Electronically Signed   By: Monte Fantasia M.D.   On: 07/21/2019 08:42    Procedures Procedures (including critical care time)  Medications Ordered in ED Medications  furosemide (LASIX) injection 40 mg (has no administration in time range)  vancomycin (VANCOREADY) IVPB 2000 mg/400 mL (has no administration in time range)  vancomycin (VANCOREADY) IVPB 1250 mg/250 mL (has no administration in time range)    ED Course  I have reviewed the triage vital signs and the nursing notes.  Pertinent labs & imaging results that were available during my care of the patient were reviewed by me and considered in my medical decision making (see chart for details).    MDM Rules/Calculators/A&P                      Patient has had some outpatient  work-up and treatment for his edema and previously had a thoracentesis.  It also sounds like he has ascites.  At this point, he is not in distress or hypoxic but he has such a large amount of extra fluid but I do not think adjusting his outpatient meds will work and he will need IV diuresis.  He is also noted to have a cellulitis of his left leg  despite being on Keflex.  Will give dose of IV vancomycin and he will need admission to hospitalist service for further treatment of his cellulitis and work-up of his fluid overload. Does not appear septic. Final Clinical Impression(s) / ED Diagnoses Final diagnoses:  Acute congestive heart failure, unspecified heart failure type ([place])  Left leg cellulitis    Rx / DC Orders ED Discharge Orders    None       Sherwood Gambler, MD 07/21/19 1222

## 2019-07-22 DIAGNOSIS — K7460 Unspecified cirrhosis of liver: Secondary | ICD-10-CM

## 2019-07-22 DIAGNOSIS — L03116 Cellulitis of left lower limb: Secondary | ICD-10-CM

## 2019-07-22 DIAGNOSIS — I509 Heart failure, unspecified: Secondary | ICD-10-CM

## 2019-07-22 DIAGNOSIS — I350 Nonrheumatic aortic (valve) stenosis: Principal | ICD-10-CM

## 2019-07-22 LAB — HEMOGLOBIN A1C
Hgb A1c MFr Bld: 6.1 % — ABNORMAL HIGH (ref 4.8–5.6)
Mean Plasma Glucose: 128 mg/dL

## 2019-07-22 LAB — COMPREHENSIVE METABOLIC PANEL
ALT: 29 U/L (ref 0–44)
AST: 34 U/L (ref 15–41)
Albumin: 3.2 g/dL — ABNORMAL LOW (ref 3.5–5.0)
Alkaline Phosphatase: 264 U/L — ABNORMAL HIGH (ref 38–126)
Anion gap: 10 (ref 5–15)
BUN: 13 mg/dL (ref 6–20)
CO2: 30 mmol/L (ref 22–32)
Calcium: 8.6 mg/dL — ABNORMAL LOW (ref 8.9–10.3)
Chloride: 99 mmol/L (ref 98–111)
Creatinine, Ser: 0.88 mg/dL (ref 0.61–1.24)
GFR calc Af Amer: >60 mL/min (ref >60)
GFR calc non Af Amer: >60 mL/min (ref >60)
Glucose, Bld: 102 mg/dL — ABNORMAL HIGH (ref 70–99)
Potassium: 3.7 mmol/L (ref 3.5–5.1)
Sodium: 139 mmol/L (ref 135–145)
Total Bilirubin: 3 mg/dL — ABNORMAL HIGH (ref 0.3–1.2)
Total Protein: 6.7 g/dL (ref 6.5–8.1)

## 2019-07-22 LAB — CBC WITH DIFFERENTIAL/PLATELET
Abs Immature Granulocytes: 0.09 10*3/uL — ABNORMAL HIGH (ref 0.00–0.07)
Basophils Absolute: 0 10*3/uL (ref 0.0–0.1)
Basophils Relative: 1 %
Eosinophils Absolute: 0.1 10*3/uL (ref 0.0–0.5)
Eosinophils Relative: 2 %
HCT: 46.9 % (ref 39.0–52.0)
Hemoglobin: 15.2 g/dL (ref 13.0–17.0)
Immature Granulocytes: 1 %
Lymphocytes Relative: 12 %
Lymphs Abs: 0.9 10*3/uL (ref 0.7–4.0)
MCH: 30.4 pg (ref 26.0–34.0)
MCHC: 32.4 g/dL (ref 30.0–36.0)
MCV: 93.8 fL (ref 80.0–100.0)
Monocytes Absolute: 0.6 10*3/uL (ref 0.1–1.0)
Monocytes Relative: 8 %
Neutro Abs: 6 10*3/uL (ref 1.7–7.7)
Neutrophils Relative %: 76 %
Platelets: 222 10*3/uL (ref 150–400)
RBC: 5 MIL/uL (ref 4.22–5.81)
RDW: 16.5 % — ABNORMAL HIGH (ref 11.5–15.5)
WBC: 7.8 10*3/uL (ref 4.0–10.5)
nRBC: 0 % (ref 0.0–0.2)

## 2019-07-22 LAB — GLUCOSE, CAPILLARY
Glucose-Capillary: 110 mg/dL — ABNORMAL HIGH (ref 70–99)
Glucose-Capillary: 151 mg/dL — ABNORMAL HIGH (ref 70–99)
Glucose-Capillary: 115 mg/dL — ABNORMAL HIGH (ref 70–99)
Glucose-Capillary: 134 mg/dL — ABNORMAL HIGH (ref 70–99)

## 2019-07-22 LAB — PHOSPHORUS: Phosphorus: 5.3 mg/dL — ABNORMAL HIGH (ref 2.5–4.6)

## 2019-07-22 LAB — D-DIMER, QUANTITATIVE: D-Dimer, Quant: 5.23 ug/mL-FEU — ABNORMAL HIGH (ref 0.00–0.50)

## 2019-07-22 LAB — MAGNESIUM: Magnesium: 1.8 mg/dL (ref 1.7–2.4)

## 2019-07-22 LAB — C-REACTIVE PROTEIN: CRP: 8 mg/dL — ABNORMAL HIGH (ref <1.0)

## 2019-07-22 LAB — FERRITIN: Ferritin: 207 ng/mL (ref 24–336)

## 2019-07-22 MED ORDER — LEVOTHYROXINE SODIUM 25 MCG PO TABS
25.0000 ug | ORAL_TABLET | Freq: Every day | ORAL | Status: DC
  Administered 2019-07-23 – 2019-08-17 (×25): 25 ug via ORAL
  Filled 2019-07-22 (×27): qty 1

## 2019-07-22 MED ORDER — ENSURE ENLIVE PO LIQD
237.0000 mL | Freq: Every day | ORAL | Status: DC
  Administered 2019-07-22 – 2019-08-05 (×14): 237 mL via ORAL

## 2019-07-22 MED ORDER — MORPHINE SULFATE (PF) 2 MG/ML IV SOLN: 1.0000 mg | INTRAVENOUS | Status: DC | PRN

## 2019-07-22 MED ORDER — ENSURE ENLIVE PO LIQD: 237.0000 mL | Freq: Two times a day (BID) | ORAL | Status: DC

## 2019-07-22 MED ORDER — CEFAZOLIN SODIUM-DEXTROSE 2-4 GM/100ML-% IV SOLN
2.0000 g | Freq: Three times a day (TID) | INTRAVENOUS | Status: DC
  Administered 2019-07-22 – 2019-07-25 (×10): 2 g via INTRAVENOUS
  Filled 2019-07-22 (×12): qty 100

## 2019-07-22 MED ORDER — PRO-STAT SUGAR FREE PO LIQD
30.0000 mL | Freq: Every day | ORAL | Status: DC
  Administered 2019-07-22 – 2019-08-17 (×24): 30 mL via ORAL
  Filled 2019-07-22 (×24): qty 30

## 2019-07-22 MED ORDER — SPIRONOLACTONE 25 MG PO TABS
50.0000 mg | ORAL_TABLET | Freq: Every day | ORAL | Status: DC
  Administered 2019-07-22: 50 mg via ORAL
  Filled 2019-07-22: qty 2

## 2019-07-22 NOTE — Progress Notes (Addendum)
Initial Nutrition Assessment  DOCUMENTATION CODES:   Morbid obesity  INTERVENTION:  Provide Ensure Enlive po once daily, each supplement provides 350 kcal and 20 grams of protein.  Provide 30 ml Prostat po once daily, each supplement provides 100 kcal and 15 grams of protein.   Encourage adequate PO intake.   NUTRITION DIAGNOSIS:   Increased nutrient needs related to chronic illness(cirrhosis) as evidenced by estimated needs.  GOAL:   Patient will meet greater than or equal to 90% of their needs  MONITOR:   PO intake, Supplement acceptance, Skin, Weight trends, Labs, I & O's  REASON FOR ASSESSMENT:   Consult Assessment of nutrition requirement/status  ASSESSMENT:   54 y.o. male with medical history significant of R pleural effusion in April 2021, cirrhosis with ascites s/p thoracentesis presenting with SOB and LE edema. COVID positive.  RD working remotely.  Pt unavailable during attempted time of contact. Meal completion has been 100%. RD to order nutritional supplements to aid in increased caloric and protein needs. Unable to complete Nutrition-Focused physical exam at this time.   Labs and medications reviewed.   Diet Order:   Diet Order            Diet heart healthy/carb modified Room service appropriate? Yes; Fluid consistency: Thin; Fluid restriction: 1500 mL Fluid  Diet effective now              EDUCATION NEEDS:   Not appropriate for education at this time  Skin:  Skin Assessment: Skin Integrity Issues: Skin Integrity Issues:: Other (Comment) Other: Left lower extremity cellulitis  Last BM:  5/18  Height:   Ht Readings from Last 1 Encounters:  07/21/19 6' (1.829 m)    Weight:   Wt Readings from Last 1 Encounters:  07/21/19 (!) 140.6 kg    BMI:  Body mass index is 42.04 kg/m.  Estimated Nutritional Needs:   Kcal:  2000-2200  Protein:  120-130 grams  Fluid:  1.5 L/day  Corrin Parker, MS, RD, LDN RD pager number/after hours  weekend pager number on Am[employer].

## 2019-07-22 NOTE — Consult Note (Addendum)
Cardiology Consultation:   Patient ID: Steve Richards MRN: [id number]; DOB: Aug 15, 1965  Admit date: 07/21/2019 Date of Consult: 07/22/2019  Primary Care Provider: System, Pcp Not In Primary Cardiologist: No primary care provider on file. New, Dr. Marlou Porch Primary Electrophysiologist:  None    Patient Profile:   Steve Richards is a 54 y.o. male with a hx of cirrhosis and right sided pleural effusion who is being seen today for the evaluation of CHF at the request of Dr. Waldron Labs.  History of Present Illness:   Steve Richards is a 54 yo male with PMH noted above. Reports prior to his admission back in 4/21 he had not been hospitalized since he was 5. Has not seen a PCP in many years. 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 He was referred to general surgery and underwent CT scan that showed a moderate pleural effusion. Had a thoracentesis done with removal of 1.5L and was placed on diuretics. Results from thora showed no malignancy. Also complained of a LLE wound along with swelling that he developed after hitting his leg on concrete back in April. He was seen at an [facility] in [facility] and started on Keflex. Reported the LLE did not seem to be improving much and presented to the ED with worsening shortness of breath.   In the ED his labs showed stable electrolytes, Cr 0.93, WBC 8.9, Hgb 14.5, hsTn 47>>40, BNP 1142. CXR showed vascular congestion with small bilateral effusions. He was markedly volume overloaded in the ED and started on IV lasix. Placed on vanco for cellulitis and admitted to IM for further management. COVID +. Echo this admission showed EF of 40-45% with global hypokinesis, G2DD, moderately dilated LA and moderate right sided pleural effusion. Also noted to have severe AS with a mean gradient of 96. Has diuresed a total of 4.1L thus far.    Past Medical History:  Diagnosis Date  . Cirrhosis of liver with ascites ([place])   . Pleural  effusion     Past Surgical History:  Procedure Laterality Date  . IR THORACENTESIS ASP PLEURAL SPACE W/IMG GUIDE  06/25/2019     Home Medications:  Prior to Admission medications   Medication Sig Start Date End Date Taking? Authorizing Provider  cephALEXin (KEFLEX) 500 MG capsule Take 500 mg by mouth every 8 (eight) hours. 07/10/19  Yes [provider]  furosemide (LASIX) 40 MG tablet Take 40 mg by mouth daily. 07/15/19  Yes [provider]  Multiple Vitamin (MULTIVITAMIN) tablet Take 1 tablet by mouth daily.   Yes [provider]    Inpatient Medications: Scheduled Meds: . aspirin EC  81 mg Oral Daily  . docusate sodium  100 mg Oral BID  . enoxaparin (LOVENOX) injection  70 mg Subcutaneous Q24H  . furosemide  40 mg Intravenous Q12H  . insulin aspart  0-15 Units Subcutaneous TID WC  . [START ON 07/23/2019] levothyroxine  25 mcg Oral Q0600  . sodium chloride flush  3 mL Intravenous Q12H  . spironolactone  50 mg Oral Daily   Continuous Infusions: . sodium chloride    .  ceFAZolin (ANCEF) IV     PRN Meds: sodium chloride, acetaminophen **OR** acetaminophen, bisacodyl, hydrALAZINE, HYDROcodone-acetaminophen, morphine injection, ondansetron **OR** ondansetron (ZOFRAN) IV, polyethylene glycol, sodium chloride flush  Allergies:   No Known Allergies  Social History:   Social History   Socioeconomic History  . Marital status: Single    Spouse  name: Not on file  . Number of children: Not on file  . Years of education: Not on file  . Highest education level: Not on file  Occupational History  . 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 Smoking status: Never Smoker  . Smokeless tobacco: Never Used  Substance and Sexual Activity  . Alcohol use: Yes    Comment: rarely  . Drug use: Never  . Sexual activity: Not on file  Other Topics Concern  . Not on file  Social History Narrative  . Not on file   Social Determinants of Health     Financial Resource Strain:   . Difficulty of Paying Living Expenses:   Food Insecurity:   . Worried About Charity fundraiser in the Last Year:   . Arboriculturist in the Last Year:   Transportation Needs:   . Film/video editor (Medical):   Marland Kitchen Lack of Transportation (Non-Medical):   Physical Activity:   . Days of Exercise per Week:   . Minutes of Exercise per Session:   Stress:   . Feeling of Stress :   Social Connections:   . Frequency of Communication with Friends and Family:   . Frequency of Social Gatherings with Friends and Family:   . Attends Religious Services:   . Active Member of Clubs or Organizations:   . Attends Archivist Meetings:   Marland Kitchen Marital Status:   Intimate Partner Violence:   . Fear of Current or Ex-Partner:   . Emotionally Abused:   Marland Kitchen Physically Abused:   . Sexually Abused:     Family History:    Family History  Problem Relation Age of Onset  . Osteoporosis Mother   . Other Father        died in a hurricane     ROS:  Please see the history of present illness.   All other ROS reviewed and negative.     Physical Exam/Data:   Vitals:   07/21/19 2100 07/21/19 2255 07/22/19 0305 07/22/19 0755  BP: 109/81 110/79 95/76 110/80  Pulse: 83 83 77 82  Resp: (!) 26 20 20    Temp:  99.4 F (37.4 C) 97.9 F (36.6 C) 98 F (36.7 C)  TempSrc:  Oral Oral Oral  SpO2: 93% 98% 91% 91%  Weight:      Height:        Intake/Output Summary (Last 24 hours) at 07/22/2019 1335 Last data filed at 07/22/2019 1311 Gross per 24 hour  Intake 725 ml  Output 3850 ml  Net -3125 ml   Last 3 Weights 07/21/2019 09/24/2011  Weight (lbs) 310 lb 276 lb  Weight (kg) 140.615 kg 125.193 kg     Body mass index is 42.04 kg/m.   Physical Exam per MD  EKG:  The EKG was personally reviewed and demonstrates:  SR with LVH  Relevant CV Studies:  Echo: 07/21/19  1. Left ventricular ejection fraction, by estimation, is 40 to 45%. The  left ventricle has mildly  decreased function. The left ventricle  demonstrates global hypokinesis. There is moderate left ventricular  hypertrophy. Left ventricular diastolic  parameters are consistent with Grade II diastolic dysfunction  (pseudonormalization).  2. Right ventricular systolic function is mildly reduced. The right  ventricular size is normal. There is moderately elevated pulmonary artery  systolic pressure.  3. Left atrial size was moderately dilated.  4. The mitral valve is abnormal. Trivial mitral valve regurgitation.  5. Cannot  exclude a bicuspid valve. Aortic valve regurgitation is mild.  Critical aortic valve stenosis. Aortic valve area, by VTI measures 0.73  cm. Aortic valve mean gradient measures 96.2 mmHg. Peak gradient of 137  mmHg. Aortic valve Vmax measures  5.86 m/s.  6. The inferior vena cava is dilated in size with <50% respiratory  variability, suggesting right atrial pressure of 15 mmHg.  7. Moderate pleural effusion in the right lateral region.   Laboratory Data:  High Sensitivity Troponin:   Recent Labs  Lab 07/21/19 0820 07/21/19 1237  TROPONINIHS 47* 40*     Chemistry Recent Labs  Lab 07/21/19 0820 07/22/19 0823  NA 136 139  K 4.2 3.7  CL 98 99  CO2 26 30  GLUCOSE 153* 102*  BUN 14 13  CREATININE 0.93 0.88  CALCIUM 8.6* 8.6*  GFRNONAA >60 >60  GFRAA >60 >60  ANIONGAP 12 10    Recent Labs  Lab 07/22/19 0823  PROT 6.7  ALBUMIN 3.2*  AST 34  ALT 29  ALKPHOS 264*  BILITOT 3.0*   Hematology Recent Labs  Lab 07/21/19 0820 07/22/19 0823  WBC 8.9 7.8  RBC 4.88 5.00  HGB 14.5 15.2  HCT 46.0 46.9  MCV 94.3 93.8  MCH 29.7 30.4  MCHC 31.5 32.4  RDW 16.5* 16.5*  PLT 225 222   BNP Recent Labs  Lab 07/21/19 1316  BNP 1,142.6*    DDimer  Recent Labs  Lab 07/21/19 1921 07/22/19 0823  DDIMER 6.01* 5.23*     Radiology/Studies:  DG Chest 2 View  Result Date: 07/21/2019 CLINICAL DATA:  Chest pain and shortness of breath EXAM: CHEST -  2 VIEW COMPARISON:  06/25/2018 FINDINGS: Congested appearance of vessels with small pleural effusions. Cardiomegaly that is chronic. Negative aortic contours. IMPRESSION: Cardiomegaly with vascular congestion and small pleural effusions. Electronically Signed   By: Monte Fantasia M.D.   On: 07/21/2019 08:42   ECHOCARDIOGRAM COMPLETE  Result Date: 07/21/2019    ECHOCARDIOGRAM REPORT   Patient Name:   Steve Richards Date of Exam: 07/21/2019 Medical Rec #:  [id number]   Height:       72.0 in Accession #:    [id number]  Weight:       310.0 lb Date of Birth:  03/16/1965   BSA:          2.567 m Patient Age:    38 years    BP:           102/73 mmHg Patient Gender: M           HR:           73 bpm. Exam Location:  Inpatient Procedure: 2D Echo Indications:    CHF-Acute Systolic 086.76 / P95.09  History:        Patient has no prior history of Echocardiogram examinations.                 Risk Factors:Obesity. Pleural effusion on right.  Sonographer:    Vikki Ports Turrentine Referring Phys: [facility]  1. Left ventricular ejection fraction, by estimation, is 40 to 45%. The left ventricle has mildly decreased function. The left ventricle demonstrates global hypokinesis. There is moderate left ventricular hypertrophy. Left ventricular diastolic parameters are consistent with Grade II diastolic dysfunction (pseudonormalization).  2. Right ventricular systolic function is mildly reduced. The right ventricular size is normal. There is moderately elevated pulmonary artery systolic pressure.  3. Left atrial size was moderately dilated.  4. The mitral valve  is abnormal. Trivial mitral valve regurgitation.  5. Cannot exclude a bicuspid valve. Aortic valve regurgitation is mild. Critical aortic valve stenosis. Aortic valve area, by VTI measures 0.73 cm. Aortic valve mean gradient measures 96.2 mmHg. Peak gradient of 137 mmHg. Aortic valve Vmax measures 5.86 m/s.  6. The inferior vena cava is dilated in size with <50%  respiratory variability, suggesting right atrial pressure of 15 mmHg.  7. Moderate pleural effusion in the right lateral region. FINDINGS  Left Ventricle: Left ventricular ejection fraction, by estimation, is 40 to 45%. The left ventricle has mildly decreased function. The left ventricle demonstrates global hypokinesis. The left ventricular internal cavity size was normal in size. There is  moderate left ventricular hypertrophy. Left ventricular diastolic parameters are consistent with Grade II diastolic dysfunction (pseudonormalization). Indeterminate filling pressures. Right Ventricle: The right ventricular size is normal. No increase in right ventricular wall thickness. Right ventricular systolic function is mildly reduced. There is moderately elevated pulmonary artery systolic pressure. The tricuspid regurgitant velocity is 3.35 m/s, and with an assumed right atrial pressure of 15 mmHg, the estimated right ventricular systolic pressure is 90.3 mmHg. Left Atrium: Left atrial size was moderately dilated. Right Atrium: Right atrial size was normal in size. Pericardium: There is no evidence of pericardial effusion. Mitral Valve: The mitral valve is abnormal. Mild to moderate mitral annular calcification. Trivial mitral valve regurgitation. Tricuspid Valve: The tricuspid valve is grossly normal. Tricuspid valve regurgitation is mild. Aortic Valve: The aortic valve is tricuspid. . There is moderate thickening and severe calcifcation of the aortic valve. Aortic valve regurgitation is mild. Aortic regurgitation PHT measures 374 msec. Severe aortic stenosis is present. There is moderate thickening of the aortic valve. There is severe calcifcation of the aortic valve. Aortic valve mean gradient measures 96.2 mmHg. Aortic valve peak gradient measures 137.3 mmHg. Aortic valve area, by VTI measures 0.73 cm. Pulmonic Valve: The pulmonic valve was normal in structure. Pulmonic valve regurgitation is not visualized. Aorta: The  aortic root and ascending aorta are structurally normal, with no evidence of dilitation. Venous: The inferior vena cava is dilated in size with less than 50% respiratory variability, suggesting right atrial pressure of 15 mmHg. IAS/Shunts: No atrial level shunt detected by color flow Doppler. Additional Comments: There is a moderate pleural effusion in the right lateral region.  LEFT VENTRICLE PLAX 2D LVIDd:         5.50 cm  Diastology LVIDs:         4.40 cm  LV e' lateral:   9.23 cm/s LV PW:         1.40 cm  LV E/e' lateral: 10.2 LV IVS:        1.40 cm  LV e' medial:    7.05 cm/s LVOT diam:     2.50 cm  LV E/e' medial:  13.4 LV SV:         108 LV SV Index:   42 LVOT Area:     4.91 cm  RIGHT VENTRICLE RV S prime:     7.20 cm/s TAPSE (M-mode): 1.2 cm LEFT ATRIUM              Index       RIGHT ATRIUM           Index LA diam:        5.90 cm  2.30 cm/m  RA Area:     22.60 cm LA Vol (A2C):   121.0 ml 47.14 ml/m RA Volume:   68.80  ml  26.80 ml/m LA Vol (A4C):   95.5 ml  37.21 ml/m LA Biplane Vol: 110.0 ml 42.86 ml/m  AORTIC VALVE AV Area (Vmax):    0.82 cm AV Area (Vmean):   0.72 cm AV Area (VTI):     0.73 cm AV Vmax:           585.80 cm/s AV Vmean:          474.000 cm/s AV VTI:            1.480 m AV Peak Grad:      137.3 mmHg AV Mean Grad:      96.2 mmHg LVOT Vmax:         97.30 cm/s LVOT Vmean:        69.800 cm/s LVOT VTI:          0.220 m LVOT/AV VTI ratio: 0.15 AI PHT:            374 msec  AORTA Ao Root diam: 3.00 cm MITRAL VALVE               TRICUSPID VALVE MV Area (PHT): 5.54 cm    TR Peak grad:   44.9 mmHg MV Decel Time: 137 msec    TR Vmax:        335.00 cm/s MV E velocity: 94.30 cm/s MV A velocity: 46.30 cm/s  SHUNTS MV E/A ratio:  2.04        Systemic VTI:  0.22 m                            Systemic Diam: 2.50 cm Lyman Bishop MD Electronically signed by Lyman Bishop MD Signature Date/Time: 07/21/2019/3:42:23 PM    Final     Assessment and Plan:   Steve Richards is a 54 y.o. male with a hx of  cirrhosis and right sided pleural effusion who is being seen today for the evaluation of CHF at the request of Dr. Waldron Labs.  1. Acute systolic HF: presented with shortness of breath and worsening LE, worse on the left. BNP elevated >1000, CXR with bilateral small effusions. Started on IV lasix with 4.1L UOP thus far and reports improvement in respiratory status. Echo this admission showed mildly decreased EF of 40-45 % with global hypokinesis.  -- would continue to diuresis as tolerated given volume overload -- ideally would BB therapy but blood pressures are soft currently. May be able to add once diuresed.  -- tolerating spiro  2. Critical AS: Echo noted a mean gradient of 96. Given his young age, CHF and acute infection unclear options regarding valve surgery. Seems reasonable to consult structural heart team for input regarding next steps.   3. LLE cellulitis: started on IV antibiotics per IM   4. COVID infection: noted positive on admission. Ddimer was elevated at 6 on admission, now trending down. No hypoxia noted.  -- LE dopplers are pending  5. Right side pleural effusion: underwent recent thoracentesis back in April with 1.5L fluid removed. Small bilateral effusions noted on CXR on admission.   For questions or updates, please contact [place] Please consult [email or web address] for contact info under   -MD to further evaluate  Signed, Steve Bellis, NP  07/22/2019 1:35 PM  Personally seen and examined. Agree with above.   54 year old male here with shortness of breath, Covid positive, left lower extremity cellulitis, with newly discovered critical aortic stenosis, V-max 6 m/s, mean gradient of 90 to 100 mmHg  with ejection fraction of 40 to 45%.  He really truly began to have shortness of breath starting in February. No syncope. No chest pain. Has had right-sided thoracentesis. Cirrhotic changes to liver on CT. All of these are likely secondary to his severe aortic  stenosis.  No family history of aortic valve disease. No prior history of rheumatic heart disease. No prior radiation history.  GEN: Well nourished, well developed, in no acute distress overweight  HEENT: normal  Neck: no JVD, carotid bruits, or masses Cardiac: RRR; 4/6 systolic murmur, no rubs, or gallops, 2-3+ lower extremity edema   Respiratory:  clear to auscultation bilaterally, normal work of breathing GI: soft, nontender, nondistended, + BS MS: no deformity or atrophy  Skin: Left leg with cellulitic appearance Neuro:  Alert and Oriented x 3, Strength and sensation are intact Psych: euthymic mood, full affect  Echocardiogram: EF 40 to 45%, mitral annular calcification noted. Critical aortic stenosis V-max as high as 6 m/s, mean gradient between 90 and 100 mmHg.  Assessment and plan:  Critical aortic stenosis Morbid obesity Covid infection-no current hypoxia. He is not on remdesivir or steroids. Lower extremity cellulitis-current antibiotics, cefazolin (Ancef) Acute systolic heart failure  -We have notified the structural heart team. Discussed with patient at length. Symptoms of shortness of breath have been ongoing for the past 3 to 4 months. Prior to this, he was in fairly usual state of health with minimal breathing issues. He had 40 pound weight gain over the last few months. Right-sided pleural effusion as a result of aortic stenosis. Cirrhotic changes in the liver can be from hepatic congestion with elevated right-sided pressures. Lower extremity edema or result of elevated right-sided pressures. -He will need a work-up with right and left heart catheterization with probable additional CT scan. See care everywhere for most recent chest CT. -Barriers to surgery/procedures are Covid positivity as well as left lower extremity cellulitis. -His aortic valve gradient is severely/critically elevated. I am very concerned about his possibility of rapid deterioration and potential sudden  cardiac death. Currently, he is not showing any signs of cardiogenic shock, sitting comfortably in his chair. Further discussion with structural heart team. -Continue with diuresis. Monitor blood pressures closely. -TSH was elevated but free T4 was normal.  Candee Furbish, MD

## 2019-07-22 NOTE — Evaluation (Signed)
Physical Therapy Evaluation Patient Details Name: Steve Richards MRN: [id number] DOB: 1965-03-14 Today's Date: 07/22/2019   History of Present Illness  53y.o. male admitted on 07/22/19 for SOB and LE edema.  Dx with acute systolic/diastolic CHF, volume overload, COVID (+), LE dopplers pending to r/o DVT due to elevated D dimer, liver cirrhosis, L LE cellulits .  Pt with significant PMH of  Pleural effusion, cirrhosis of the liver with ascites d/p thoracentesis.  Caridology consulted and also found severe aortic stenosis.   Clinical Impression  Pt is able to walk a few laps around the room over painful LEs (left worse than right).  He maintained O2 sats greater than 90% on RA during ambulation and HR was 80s-90s during gait.  We stayed in the room as he was weeping from his legs and I wanted to not weep into the hallway.  RN wrapping legs during session.  Pt would benefit from cane to help with stability on stairs (where he fell to get this wound) to enter his home.  PT will try to bring a single point cane for practice next session. PT will continue to follow acutely for safe mobility progression.    Follow Up Recommendations No PT follow up    Equipment Recommendations  Cane    Recommendations for Other Services   NA     Precautions / Restrictions Precautions Precautions: Fall Precaution Comments: weeping bil LE wounds left worse than R      Mobility  Bed Mobility               General bed mobility comments: Pt was OOB in the recliner chair.   Transfers Overall transfer level: Needs assistance Equipment used: None Transfers: Sit to/from Stand Sit to Stand: Supervision         General transfer comment: supervision for safety  Ambulation/Gait Ambulation/Gait assistance: Supervision Gait Distance (Feet): 45 Feet Assistive device: None Gait Pattern/deviations: Step-through pattern;Antalgic;Shuffle;Wide base of support Gait velocity: decreased Gait velocity  interpretation: 1.31 - 2.62 ft/sec, indicative of limited community ambulator General Gait Details: Pt with slow, antalgic gait pattern, seemingly favoring the left leg more than the right. SOB with increased distance, but O2 sats remained >90 on RA and HR in the 80s-90s during gait around the room.  When he is more properly wrapped in his LEs we will go to the hallway.          Balance Overall balance assessment: Needs assistance Sitting-balance support: Feet supported;No upper extremity supported Sitting balance-Leahy Scale: Good     Standing balance support: No upper extremity supported Standing balance-Leahy Scale: Good                               Pertinent Vitals/Pain Pain Assessment: Faces Faces Pain Scale: Hurts even more Pain Location: Left LE Pain Descriptors / Indicators: Aching;Burning Pain Intervention(s): Limited activity within patient's tolerance;Monitored during session;Repositioned    Home Living Family/patient expects to be discharged to:: Private residence Living Arrangements: Alone   Type of Home: House Home Access: Stairs to enter Entrance Stairs-Rails: Right Entrance Stairs-Number of Steps: 3 Home Layout: One level Home Equipment: None      Prior Function Level of Independence: Independent         Comments: pt works as a Geophysicist/field seismologist.      Journalist, newspaper        Extremity/Trunk Assessment   Upper Extremity Assessment Upper Extremity Assessment: Overall [facility]  for tasks assessed    Lower Extremity Assessment Lower Extremity Assessment: Generalized weakness(bil LE edema, weeping wounds left worse than R)    Cervical / Trunk Assessment Cervical / Trunk Assessment: Normal  Communication   Communication: No difficulties  Cognition Arousal/Alertness: Awake/alert Behavior During Therapy: WFL for tasks assessed/performed Overall Cognitive Status: Within Functional Limits for tasks assessed                                         General Comments General comments (skin integrity, edema, etc.): Pt reports compliance with flutter valve and incentive spirometer use.      Assessment/Plan    PT Assessment Patient needs continued PT services  PT Problem List Decreased strength;Decreased activity tolerance;Decreased balance;Decreased mobility;Cardiopulmonary status limiting activity;Obesity;Pain;Decreased skin integrity       PT Treatment Interventions DME instruction;Gait training;Stair training;Functional mobility training;Therapeutic activities;Therapeutic exercise;Balance training;Patient/family education    PT Goals (Current goals can be found in the Care Plan section)  Acute Rehab PT Goals Patient Stated Goal: to get some fluid off and get his leg wounds headed in the right direction PT Goal Formulation: With patient Time For Goal Achievement: 08/05/19 Potential to Achieve Goals: Good    Frequency Min 3X/week        AM-PAC PT "6 Clicks" Mobility  Outcome Measure Help needed turning from your back to your side while in a flat bed without using bedrails?: A Little Help needed moving from lying on your back to sitting on the side of a flat bed without using bedrails?: A Little Help needed moving to and from a bed to a chair (including a wheelchair)?: None Help needed standing up from a chair using your arms (e.g., wheelchair or bedside chair)?: None Help needed to walk in hospital room?: None Help needed climbing 3-5 steps with a railing? : A Little 6 Click Score: 21    End of Session   Activity Tolerance: Patient limited by fatigue Patient left: in chair;with call bell/phone within reach Nurse Communication: Mobility status PT Visit Diagnosis: Muscle weakness (generalized) (M62.81);Difficulty in walking, not elsewhere classified (R26.2);Pain Pain - Right/Left: Left Pain - part of body: Leg    Time: 1523-1606(did not bill a unit due to RN in room dressing LEs) PT Time  Calculation (min) (ACUTE ONLY): 43 min   Charges:         Verdene Lennert, PT, DPT  Acute Rehabilitation [phone] pager #[phone] office     PT Evaluation $PT Eval Moderate Complexity: 1 Mod PT Treatments $Gait Training: 8-22 mins        07/22/2019, 4:18 PM

## 2019-07-22 NOTE — Progress Notes (Signed)
Steve Richards [id number] Admission Data: 07/22/2019 12:05 AM Attending Provider: Karmen Bongo, MD  WCB:[id number], Pcp Not In Consults/ Treatment Team:   Steve Richards is a 54 y.o. male patient admitted from ED awake, alert  & orientated  X 3,  Full Code, VSS - Blood pressure 110/79, pulse 83, temperature 99.4 F (37.4 C), temperature source Oral, resp. rate 20, height 6' (1.829 m), weight (!) 140.6 kg, SpO2 98 %., O2 room air, no c/o shortness of breath, no c/o chest pain, no distress noted. Tele # [id number] placed and pt is currently running:normal sinus rhythm.   IV site WDL:  forearm right, condition patent and no redness with a transparent dsg that's clean dry and intact.  Allergies:  No Known Allergies   Past Medical History:  Diagnosis Date  . Cirrhosis of liver with ascites ([place])   . Pleural effusion    Pt orientation to unit, room and routine. Information packet given to patient/family and safety video watched.  Admission INP armband ID verified with patient/family, and in place. SR up x 2, fall risk assessment complete with Patient and family verbalizing understanding of risks associated with falls. Pt verbalizes an understanding of how to use the call bell and to call for help before getting out of bed.  Skin, erythema on LLE with weeping wounds on anterior and posterior of lower leg.   Will cont to monitor and assist as needed.  Walker Shadow, RN 07/22/2019 12:05 AM

## 2019-07-22 NOTE — Plan of Care (Signed)
  Problem: Clinical Measurements: Goal: Respiratory complications will improve Outcome: Progressing   

## 2019-07-22 NOTE — Progress Notes (Signed)
PROGRESS NOTE                                                                                                                                                                                                             Patient Demographics:    Steve Richards, is a 54 y.o. male, DOB - November 07, 1965, CHE:[id number]  Admit date - 07/21/2019   Admitting Physician Karmen Bongo, MD  Outpatient Primary MD for the patient is System, Pcp Not In  LOS - 1   Chief Complaint  Patient presents with  . Shortness of Breath  . Leg Swelling       Brief Narrative     HPI: Steve Richards is a 54 y.o. male with medical history significant of R pleural effusion in April 2021 presenting with SOB and LE edema.  Last time he was in the hospital was when he was 54yo.  Recently, he had a thoracentesis.  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  He saw the [employer] doctor, ?hernia, sent to Dr. Hassell Done (surgery).  CT was ordered and it showed a moderate pleural effusion in February.  He had significant mostly lower body edema.  He is not sure why he has the edema.  Thoracentesis was done with removal of about 1.5L fluid and he was started on diuretics.  This has been a very slow process since this has happened through [employer] but he has now been released from that.  He saw Dr. Hassell Done to get the results from the thora on Thursday - no malignancy and unremarkable evaluation (no PNA or cancer).  They discussed his LLE wound that isn't helping - so much fluid that he can't bend his knee and hit his leg on the concrete on April 30.  It was originally weepy with copious amounts of fluid.  He went to [id number] in [facility] and was started on Keflex and wrapped with gauze.  He started the antibiotics over the weekend along with Lasix.  The Lasix led to diuresis with the initial dose, but less now.  The antibiotics did not appear  to make a different.  No fever.  Significant DOE, which has worsened since November.  He did have orthopnea prior to the thoracentesis, but he has  not been lying flat since then.  He did feel significantly better after the thoracentesis but has slowly gotten worse since then.  Occasional cough, dry.  Rare wheezing.    ED Course:  Subacute LE edema, fluid overloaded.  On Lasix and still overloaded.  Also with poor wound healing; placed on Keflex a few days ago without improvement.  Giving Vanc, Lasix.     Subjective:    Steve Richards today has, No headache, No chest pain, No abdominal pain, reports minimal dyspnea with activity, denies any fever or chills.   Assessment  & Plan :    Principal Problem:   New onset of congestive heart failure (HCC) Active Problems:   Hepatic cirrhosis (HCC)   Pleural effusion on right   Systolic ejection murmur   Cellulitis and abscess of left lower extremity   Hyperglycemia   Obesity, Class III, BMI 40-49.9 (morbid obesity) (HCC)   Acute systolic/diastolic CHF -Did not follow with his PCP for last 6 years, with poor outpatient follow-up. -Evidence of volume overload on imaging, recurrent pleural effusion, elevated BNP and lower extremity edema. -Continue with IV diuresis at 40 mg IV twice daily, monitor daily weight, strict ins and outs, as well I have added Aldactone, please see discussion below. -2D echo significant for low EF 40 to 45%, with grade 2 diastolic CHF, cardiology has been consulted regarding further recommendations.  COVID-19 infection -Patient denies any fever, cough currently, no evidence of pneumonia on imaging, he has no hypoxia, so for now no indication for steroids and remdesivir, but will continue to monitor closely especially with significantly elevated inflammatory markers. -D-dimers elevated at 6 on admission, currently trending down, will check venous Dopplers to rule out DVT.  We will change his Lovenox dose to 0.5 mg/kg every  12 hours if remains above 5 tomorrow.  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 -Right upper quadrant ultrasound. - cirrhosis can contribute to volume overload, will start on Aldactone  Left lower extremity cellulitis -He appears to be having left lower extremity cellulitis, which did not respond to Keflex, as well there is evidence of venous stasis disease contributing to his infection, as well significant lower extremity edema as well. -He is on IV vancomycin, will change to IV cefazolin  Hyperglycemia -A1c is 6.1  Morbid obesity  Hypothyroidism -TSH elevated at 14, with low normal free T4 at 0.65 I will start on low-dose Synthroid   Code Status : Full  Disposition Plan  :  Status is: Inpatient  Remains inpatient appropriate because:IV treatments appropriate due to intensity of illness or inability to take PO   Dispo: The patient is from: Home              Anticipated d/c is to: Home              Anticipated d/c date is: > 3 days              Patient currently is not medically stable to d/c.         Consults  :  Cardiology  Procedures  : None  DVT Prophylaxis  :  [place] lovenox  Lab Results  Component Value Date   PLT 222 07/22/2019    Antibiotics  :   Anti-infectives (From admission, onward)   Start     Dose/Rate Route Frequency Ordered Stop   07/22/19 0000  vancomycin (VANCOREADY) IVPB 1250 mg/250 mL     1,250 mg 166.7 mL/hr over 90 Minutes Intravenous Every 12 hours 07/21/19 1141     07/21/19 1145  vancomycin (VANCOREADY) IVPB 2000 mg/400 mL     2,000 mg 200 mL/hr over 120 Minutes Intravenous  Once 07/21/19 1141 07/21/19 1509         Objective:   Vitals:   07/21/19 2100 07/21/19 2255 07/22/19 0305 07/22/19 0755  BP: 109/81 110/79 95/76 110/80  Pulse: 83 83 77 82  Resp: (!) 26 20 20    Temp:  99.4 F (37.4 C) 97.9 F (36.6 C) 98 F (36.7 C)  TempSrc:  Oral Oral Oral  SpO2: 93% 98% 91% 91%  Weight:      Height:        Wt Readings from Last 3 Encounters:  07/21/19 (!) 140.6 kg  09/24/11 125.2 kg     Intake/Output Summary (Last 24 hours) at 07/22/2019 1205 Last data filed at 07/22/2019 0814 Gross per 24 hour  Intake 253 ml  Output 3850 ml  Net -3597 ml     Physical Exam  Awake Alert, Oriented X 3, No new F.N deficits, Normal affect  Symmetrical Chest wall movement, Good air movement bilaterally, CTAB RRR,No Gallops,Rubs or new Murmurs, No Parasternal Heave +ve B.Sounds, Abd Soft, No tenderness,No rebound - guarding or rigidity. No Cyanosis, lateral lower extremity edema +2, with left lower extremity superficial wounds with sanguinous discharge, and some significant erythema .        Data Review:    CBC Recent Labs  Lab 07/21/19 0820 07/22/19 0823  WBC 8.9 7.8  HGB 14.5 15.2  HCT 46.0 46.9  PLT 225 222  MCV 94.3 93.8  MCH 29.7 30.4  MCHC 31.5 32.4  RDW 16.5* 16.5*  LYMPHSABS  --  0.9  MONOABS  --  0.6  EOSABS  --  0.1  BASOSABS  --  0.0    Chemistries  Recent Labs  Lab 07/21/19 0820 07/22/19 0823  NA 136 139  K 4.2 3.7  CL 98 99  CO2 26 30  GLUCOSE 153* 102*  BUN 14 13  CREATININE 0.93 0.88  CALCIUM 8.6* 8.6*  MG  --  1.8  AST  --  34  ALT  --  29  ALKPHOS  --  264*  BILITOT  --  3.0*   ------------------------------------------------------------------------------------------------------------------ Recent Labs    07/21/19 1515  CHOL 128  HDL 37*  LDLCALC 74  TRIG 87  CHOLHDL 3.5    Lab Results  Component Value Date   HGBA1C 6.1 (H) 07/21/2019    ------------------------------------------------------------------------------------------------------------------ Recent Labs    07/21/19 1515  TSH 14.146*   ------------------------------------------------------------------------------------------------------------------ Recent Labs    07/21/19 1921 07/22/19 0823  FERRITIN 235 207    Coagulation profile No results for input(s): INR, PROTIME in the last 168 hours.  Recent Labs    07/21/19 1921 07/22/19 0823  DDIMER 6.01* 5.23*    Cardiac Enzymes No results for input(s): CKMB, TROPONINI, MYOGLOBIN in the last 168 hours.  Invalid input(s): CK ------------------------------------------------------------------------------------------------------------------    Component Value Date/Time   BNP 1,142.6 (H) 07/21/2019 1316    Inpatient Medications  Scheduled Meds: . aspirin EC  81 mg Oral Daily  .  docusate sodium  100 mg Oral BID  . enoxaparin (LOVENOX) injection  70 mg Subcutaneous Q24H  . furosemide  40 mg Intravenous Q12H  . insulin aspart  0-15 Units Subcutaneous TID WC  . sodium chloride flush  3 mL Intravenous Q12H  . spironolactone  50 mg Oral Daily   Continuous Infusions: . sodium chloride    . vancomycin Stopped (07/22/19 0814)   PRN Meds:.sodium chloride, acetaminophen **OR** acetaminophen, bisacodyl, hydrALAZINE, HYDROcodone-acetaminophen, morphine injection, ondansetron **OR** ondansetron (ZOFRAN) IV, polyethylene glycol, sodium chloride flush  Micro Results Recent Results (from the past 240 hour(s))  Culture, blood (routine x 2)     Status: None (Preliminary result)   Collection Time: 07/21/19 12:37 PM   Specimen: BLOOD RIGHT HAND  Result Value Ref Range Status   Specimen Description BLOOD RIGHT HAND  Final   Special Requests   Final    BOTTLES DRAWN AEROBIC ONLY Blood Culture results may not be optimal due to an inadequate volume of blood received in culture bottles   Culture   Final    NO GROWTH <  24 HOURS Performed at [facility], [place] [address], [place], [place] [address]    Report Status PENDING  Incomplete  SARS Coronavirus 2 by RT PCR (hospital order, performed in [facility] hospital lab) Nasopharyngeal Nasopharyngeal Swab     Status: Abnormal   Collection Time: 07/21/19 12:37 PM   Specimen: Nasopharyngeal Swab  Result Value Ref Range Status   SARS Coronavirus 2 POSITIVE (A) NEGATIVE Final    Comment: RESULT CALLED TO, READ BACK BY AND VERIFIED WITH: Angelina Ok RN 16:45 07/21/19 ([email or web address]) (NOTE) SARS-CoV-2 target nucleic acids are DETECTED SARS-CoV-2 RNA is generally detectable in upper respiratory specimens  during the acute phase of infection.  Positive results are indicative  of the presence of the identified virus, but do not rule out bacterial infection or co-infection with other pathogens not detected by the test.  Clinical correlation with patient history and  other diagnostic information is necessary to determine patient infection status.  The expected result is negative. Fact Sheet for Patients:   StrictlyIdeas.no  Fact Sheet for Healthcare Providers:   BankingDealers.co.za   This test is not yet approved or cleared by the [place] FDA and  has been authorized for detection and/or diagnosis of SARS-CoV-2 by FDA under an Emergency Use Authorization (EUA).  This EUA will remain in effect (meaning this  test can be used) for the duration of  the COVID-19 declaration under Section 564(b)(1) of the Act, 21 U.S.C. section 360-bbb-3(b)(1), unless the authorization is terminated or revoked sooner. Performed at [facility], [place] [address] [place], [place] [address]   Culture, blood (routine x 2)     Status: None (Preliminary result)   Collection Time: 07/21/19 12:38 PM   Specimen: BLOOD  Result Value Ref Range Status   Specimen Description BLOOD RIGHT ANTECUBITAL  Final   Special Requests    Final    BOTTLES DRAWN AEROBIC AND ANAEROBIC Blood Culture results may not be optimal due to an inadequate volume of blood received in culture bottles   Culture   Final    NO GROWTH < 24 HOURS Performed at [facility], [place] [address] [place], [place] [address]    Report Status PENDING  Incomplete    Radiology Reports DG Chest 1 View  Result Date: 06/25/2019 CLINICAL DATA:  Status post right thoracentesis today. EXAM: CHEST  1 VIEW COMPARISON:  None. FINDINGS: No pneumothorax is seen after thoracentesis. Only a small residual pleural effusion is identified. The patient has a small left pleural effusion. There is cardiomegaly without edema. IMPRESSION: Negative for pneumothorax after thoracentesis. Small bilateral pleural effusions. Cardiomegaly. Electronically Signed   By: Inge Rise M.D.   On: 06/25/2019 15:22   DG Chest 2 View  Result Date: 07/21/2019 CLINICAL DATA:  Chest pain and shortness of breath EXAM: CHEST - 2 VIEW COMPARISON:  06/25/2018 FINDINGS: Congested appearance of vessels with small pleural effusions. Cardiomegaly that is chronic. Negative aortic contours. IMPRESSION: Cardiomegaly with vascular congestion and small pleural effusions. Electronically Signed   By: Monte Fantasia M.D.   On: 07/21/2019 08:42   ECHOCARDIOGRAM COMPLETE  Result Date: 07/21/2019    ECHOCARDIOGRAM REPORT   Patient Name:   Steve Richards Date of Exam: 07/21/2019 Medical Rec #:  [id number]   Height:       72.0 in Accession #:    [id number]  Weight:       310.0 lb Date of Birth:  August 19, 1965   BSA:          2.567 m Patient Age:    30 years    BP:           102/73 mmHg Patient Gender: M           HR:           73 bpm. Exam Location:  Inpatient Procedure: 2D Echo Indications:    CHF-Acute Systolic 974.16 / L84.53  History:        Patient has no prior history of Echocardiogram examinations.                 Risk Factors:Obesity. Pleural effusion on right.  Sonographer:    Vikki Ports Turrentine Referring  Phys: [place]  1. Left ventricular ejection fraction, by estimation, is 40 to 45%. The left ventricle has mildly decreased function. The left ventricle demonstrates global hypokinesis. There is moderate left ventricular hypertrophy. Left ventricular diastolic parameters are consistent with Grade II diastolic dysfunction (pseudonormalization).  2. Right ventricular systolic function is mildly reduced. The right ventricular size is normal. There is moderately elevated pulmonary artery systolic pressure.  3. Left atrial size was moderately dilated.  4. The mitral valve is abnormal. Trivial mitral valve regurgitation.  5. Cannot exclude a bicuspid valve. Aortic valve regurgitation is mild. Critical aortic valve stenosis. Aortic valve area, by VTI measures 0.73 cm. Aortic valve mean gradient measures 96.2 mmHg. Peak gradient of 137 mmHg. Aortic valve Vmax measures 5.86 m/s.  6. The inferior vena cava is dilated in size with <50% respiratory variability, suggesting right atrial pressure of 15 mmHg.  7. Moderate pleural effusion in the right lateral region. FINDINGS  Left Ventricle: Left ventricular ejection fraction, by estimation, is 40 to 45%. The left ventricle has mildly decreased function. The left ventricle demonstrates global hypokinesis. The left ventricular internal cavity size was normal in size. There is  moderate left ventricular hypertrophy. Left ventricular diastolic parameters are consistent with Grade II diastolic dysfunction (pseudonormalization). Indeterminate filling pressures. Right Ventricle: The right ventricular size is normal. No increase in right ventricular wall thickness. Right ventricular systolic function is mildly reduced. There is moderately elevated pulmonary artery systolic pressure. The tricuspid regurgitant velocity is 3.35 m/s, and with an assumed right atrial pressure of 15 mmHg, the estimated right ventricular systolic pressure is 64.6 mmHg. Left Atrium: Left  atrial size was moderately dilated. Right Atrium: Right atrial  size was normal in size. Pericardium: There is no evidence of pericardial effusion. Mitral Valve: The mitral valve is abnormal. Mild to moderate mitral annular calcification. Trivial mitral valve regurgitation. Tricuspid Valve: The tricuspid valve is grossly normal. Tricuspid valve regurgitation is mild. Aortic Valve: The aortic valve is tricuspid. . There is moderate thickening and severe calcifcation of the aortic valve. Aortic valve regurgitation is mild. Aortic regurgitation PHT measures 374 msec. Severe aortic stenosis is present. There is moderate thickening of the aortic valve. There is severe calcifcation of the aortic valve. Aortic valve mean gradient measures 96.2 mmHg. Aortic valve peak gradient measures 137.3 mmHg. Aortic valve area, by VTI measures 0.73 cm. Pulmonic Valve: The pulmonic valve was normal in structure. Pulmonic valve regurgitation is not visualized. Aorta: The aortic root and ascending aorta are structurally normal, with no evidence of dilitation. Venous: The inferior vena cava is dilated in size with less than 50% respiratory variability, suggesting right atrial pressure of 15 mmHg. IAS/Shunts: No atrial level shunt detected by color flow Doppler. Additional Comments: There is a moderate pleural effusion in the right lateral region.  LEFT VENTRICLE PLAX 2D LVIDd:         5.50 cm  Diastology LVIDs:         4.40 cm  LV e' lateral:   9.23 cm/s LV PW:         1.40 cm  LV E/e' lateral: 10.2 LV IVS:        1.40 cm  LV e' medial:    7.05 cm/s LVOT diam:     2.50 cm  LV E/e' medial:  13.4 LV SV:         108 LV SV Index:   42 LVOT Area:     4.91 cm  RIGHT VENTRICLE RV S prime:     7.20 cm/s TAPSE (M-mode): 1.2 cm LEFT ATRIUM              Index       RIGHT ATRIUM           Index LA diam:        5.90 cm  2.30 cm/m  RA Area:     22.60 cm LA Vol (A2C):   121.0 ml 47.14 ml/m RA Volume:   68.80 ml  26.80 ml/m LA Vol (A4C):   95.5 ml   37.21 ml/m LA Biplane Vol: 110.0 ml 42.86 ml/m  AORTIC VALVE AV Area (Vmax):    0.82 cm AV Area (Vmean):   0.72 cm AV Area (VTI):     0.73 cm AV Vmax:           585.80 cm/s AV Vmean:          474.000 cm/s AV VTI:            1.480 m AV Peak Grad:      137.3 mmHg AV Mean Grad:      96.2 mmHg LVOT Vmax:         97.30 cm/s LVOT Vmean:        69.800 cm/s LVOT VTI:          0.220 m LVOT/AV VTI ratio: 0.15 AI PHT:            374 msec  AORTA Ao Root diam: 3.00 cm MITRAL VALVE               TRICUSPID VALVE MV Area (PHT): 5.54 cm    TR Peak grad:   44.9 mmHg MV Decel  Time: 137 msec    TR Vmax:        335.00 cm/s MV E velocity: 94.30 cm/s MV A velocity: 46.30 cm/s  SHUNTS MV E/A ratio:  2.04        Systemic VTI:  0.22 m                            Systemic Diam: 2.50 cm Lyman Bishop MD Electronically signed by Lyman Bishop MD Signature Date/Time: 07/21/2019/3:42:23 PM    Final    IR THORACENTESIS ASP PLEURAL SPACE W/IMG GUIDE  Result Date: 06/25/2019 INDICATION: Patient with history of bilateral pleural effusions, right greater than left. Request made for diagnostic and therapeutic right thoracentesis EXAM: ULTRASOUND GUIDED DIAGNOSTIC AND THERAPEUTIC RIGHT THORACENTESIS MEDICATIONS: None COMPLICATIONS: None immediate. PROCEDURE: An ultrasound guided thoracentesis was thoroughly discussed with the patient and questions answered. The benefits, risks, alternatives and complications were also discussed. The patient understands and wishes to proceed with the procedure. Written consent was obtained. Ultrasound was performed to localize and mark an adequate pocket of fluid in the right chest. The area was then prepped and draped in the normal sterile fashion. 1% Lidocaine was used for local anesthesia. Under ultrasound guidance a 6 Fr Safe-T-Centesis catheter was introduced. Thoracentesis was performed. The catheter was removed and a dressing applied. FINDINGS: A total of approximately 1.5 liters of yellow fluid was  removed. Samples were sent to the laboratory as requested by the clinical team. IMPRESSION: Successful ultrasound guided diagnostic and therapeutic right thoracentesis yielding 1.5 liters of pleural fluid. Read by: Rowe Robert, PA-C Electronically Signed   By: Corrie Mckusick D.O.   On: 06/25/2019 15:38    Phillips Climes M.D on 07/22/2019 at 12:05 PM  Between 7am to 7pm - Pager - [phone]  After 7pm go to [email or web address] - password [facility]  [facility] -  Office  [phone]

## 2019-07-23 ENCOUNTER — Encounter (HOSPITAL_BASED_OUTPATIENT_CLINIC_OR_DEPARTMENT_OTHER): Payer: 59 | Admitting: Internal Medicine

## 2019-07-23 ENCOUNTER — Inpatient Hospital Stay (HOSPITAL_COMMUNITY): Payer: 59

## 2019-07-23 ENCOUNTER — Encounter (HOSPITAL_COMMUNITY): Payer: Self-pay | Admitting: Internal Medicine

## 2019-07-23 DIAGNOSIS — R7989 Other specified abnormal findings of blood chemistry: Secondary | ICD-10-CM

## 2019-07-23 DIAGNOSIS — L02416 Cutaneous abscess of left lower limb: Secondary | ICD-10-CM

## 2019-07-23 DIAGNOSIS — R609 Edema, unspecified: Secondary | ICD-10-CM

## 2019-07-23 LAB — GLUCOSE, CAPILLARY
Glucose-Capillary: 122 mg/dL — ABNORMAL HIGH (ref 70–99)
Glucose-Capillary: 158 mg/dL — ABNORMAL HIGH (ref 70–99)
Glucose-Capillary: 109 mg/dL — ABNORMAL HIGH (ref 70–99)
Glucose-Capillary: 185 mg/dL — ABNORMAL HIGH (ref 70–99)

## 2019-07-23 LAB — COMPREHENSIVE METABOLIC PANEL
ALT: 22 U/L (ref 0–44)
AST: 27 U/L (ref 15–41)
Albumin: 2.9 g/dL — ABNORMAL LOW (ref 3.5–5.0)
Alkaline Phosphatase: 233 U/L — ABNORMAL HIGH (ref 38–126)
Anion gap: 9 (ref 5–15)
BUN: 16 mg/dL (ref 6–20)
CO2: 33 mmol/L — ABNORMAL HIGH (ref 22–32)
Calcium: 8.6 mg/dL — ABNORMAL LOW (ref 8.9–10.3)
Chloride: 96 mmol/L — ABNORMAL LOW (ref 98–111)
Creatinine, Ser: 0.96 mg/dL (ref 0.61–1.24)
GFR calc Af Amer: >60 mL/min (ref >60)
GFR calc non Af Amer: >60 mL/min (ref >60)
Glucose, Bld: 112 mg/dL — ABNORMAL HIGH (ref 70–99)
Potassium: 3.9 mmol/L (ref 3.5–5.1)
Sodium: 138 mmol/L (ref 135–145)
Total Bilirubin: 2.6 mg/dL — ABNORMAL HIGH (ref 0.3–1.2)
Total Protein: 6.3 g/dL — ABNORMAL LOW (ref 6.5–8.1)

## 2019-07-23 LAB — FUNGUS CULTURE WITH STAIN

## 2019-07-23 LAB — CBC WITH DIFFERENTIAL/PLATELET
Abs Immature Granulocytes: 0.09 10*3/uL — ABNORMAL HIGH (ref 0.00–0.07)
Basophils Absolute: 0.1 10*3/uL (ref 0.0–0.1)
Basophils Relative: 1 %
Eosinophils Absolute: 0.1 10*3/uL (ref 0.0–0.5)
Eosinophils Relative: 2 %
HCT: 42.5 % (ref 39.0–52.0)
Hemoglobin: 13.6 g/dL (ref 13.0–17.0)
Immature Granulocytes: 1 %
Lymphocytes Relative: 12 %
Lymphs Abs: 0.9 10*3/uL (ref 0.7–4.0)
MCH: 30 pg (ref 26.0–34.0)
MCHC: 32 g/dL (ref 30.0–36.0)
MCV: 93.6 fL (ref 80.0–100.0)
Monocytes Absolute: 0.6 10*3/uL (ref 0.1–1.0)
Monocytes Relative: 8 %
Neutro Abs: 5.5 10*3/uL (ref 1.7–7.7)
Neutrophils Relative %: 76 %
Platelets: 197 10*3/uL (ref 150–400)
RBC: 4.54 MIL/uL (ref 4.22–5.81)
RDW: 16.4 % — ABNORMAL HIGH (ref 11.5–15.5)
WBC: 7.3 10*3/uL (ref 4.0–10.5)
nRBC: 0 % (ref 0.0–0.2)

## 2019-07-23 LAB — FUNGAL ORGANISM REFLEX

## 2019-07-23 LAB — C-REACTIVE PROTEIN: CRP: 6 mg/dL — ABNORMAL HIGH (ref <1.0)

## 2019-07-23 LAB — FUNGUS CULTURE RESULT

## 2019-07-23 LAB — MAGNESIUM: Magnesium: 1.8 mg/dL (ref 1.7–2.4)

## 2019-07-23 LAB — FERRITIN: Ferritin: 203 ng/mL (ref 24–336)

## 2019-07-23 LAB — D-DIMER, QUANTITATIVE: D-Dimer, Quant: 3.88 ug/mL-FEU — ABNORMAL HIGH (ref 0.00–0.50)

## 2019-07-23 LAB — PHOSPHORUS: Phosphorus: 4.8 mg/dL — ABNORMAL HIGH (ref 2.5–4.6)

## 2019-07-23 NOTE — Progress Notes (Deleted)
[place] VALVE TEAM  Inpatient TAVR Consultation:   Patient ID: Steve Richards; [id number]; Apr 01, 1965   Admit date: 07/21/2019 Date of Consult: 07/23/2019  Primary Care Provider: System, Pcp Not In Primary Cardiologist: Dr. Marlou Porch  Patient Profile:   Steve Richards is a 54 y.o. 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 Marlou Porch.  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  He is not married and has no children.  He lives alone and functions independently.  He drives and takes care of all of his own ADLs.  Of note, he has had regular dental work over the years and has no ongoing dental issues.  He was in his usual state of health until recently when he felt like he pulled his groin while at work. At this time he noted worsening LE edema and also scrotal swelling. He saw the [employer] medical provider and there was a question of a hernia.  An abdominal CT was ordered which showed an incidental large right pleural effusion, cardiomegaly with hepatic venous reflux of contrast material suggesting CHF, cirrhotic morphology to the liver with splenomegaly, ascites, and generalized subcutaneous edema. This prompted a chest CT which showed a moderate right pleural effusion and patchy groundglass opacities throughout the right lung with right middle and lower lobe airspace consolidations. Findings are concerning for a combination of asymmetric edema and/or pneumonia. Advise follow-up to resolution to exclude underlying mass. He underwent ultrasound-guided thoracentesis on 06/25/19 with removal of 1.5 L of fluid and was started on diuretics.  Cytology from pleural fluid showed no evidence of  malignancy.  He also recently injured his left lower extremity on a piece of concrete.  It became red and weepy with copious amounts of fluids.  He was evaluated at an [facility] walk-in clinic and started on Keflex and the leg was wrapped.  He was also started on Lasix.  He developed progressively worsening lower extremity edema as well as dyspnea on exertion and eventually presented to [facility] emergency department on 07/21/2019. Pertinent labs include creat 0.93, K 4.2, Hg 14.5, WBC 8.9, HS troponin 47--> 40, lactic acid 1.9, BNP 1,142, Covid positive, Hg A1c 6.1, TSH 14, CRP 9.8,  D dimer 6.01, LDH 317.  2D echo on 07/21/2019 showed mild LV dysfunction with an EF 40 to 45% and global hypokinesis, moderate LVH, grade 2 diastolic dysfunction, mild RV dysfunction, moderately elevated pulmonary artery pressure, moderate LAE, critical aortic stenosis with a mean gradient of 96.2 mmHg, peak gradient 137 mmHg, V-max 5.86 m/s, AVA 0.73 cm2, DVI 0.15, mild aortic regurgitation (bicuspid aortic valve cannot be excluded) and moderate right pleural effusion. LE cellulitis being treated with antibiotics by internal medicine. The structural heart team is consulted for consideration of aortic valve replacement, either SAVR or TAVR based on work-up.  Today he is seen sitting up in his chair.  He reports worsening dyspnea on exertion over the last several months as well as progressive unexplained weight gain.  He reports worsening lower extremity edema, scrotal edema, abdominal distention.  He reports his baseline weight being around 290 pounds and getting up to 320 pounds recently.  Since being started on IV diuresis he is feeling much  better in terms of his breathing.  His legs are currently wrapped.  He denies chest pain.  He denies dizziness or syncope.  He reports worsening fatigue and decreased exercise tolerance. Of note, the patient believes he had Covid back in October of last year when he lost his sense of taste  and smell.  He is currently totally asymptomatic with no Covid type symptoms. He also does not drink alcohol, smoke or use illicit drugs.   Past Medical History:  Diagnosis Date  . Chronic combined systolic (congestive) and diastolic (congestive) heart failure ([place])   . Cirrhosis of liver with ascites ([place])   . Critical aortic valve stenosis   . Morbid obesity ([place])   . Pleural effusion     Past Surgical History:  Procedure Laterality Date  . IR THORACENTESIS ASP PLEURAL SPACE W/IMG GUIDE  06/25/2019     Inpatient Medications: Scheduled Meds: . aspirin EC  81 mg Oral Daily  . docusate sodium  100 mg Oral BID  . enoxaparin (LOVENOX) injection  70 mg Subcutaneous Q24H  . feeding supplement (ENSURE ENLIVE)  237 mL Oral Q1500  . feeding supplement (PRO-STAT SUGAR FREE 64)  30 mL Oral Daily  . furosemide  40 mg Intravenous Q12H  . insulin aspart  0-15 Units Subcutaneous TID WC  . levothyroxine  25 mcg Oral Q0600  . sodium chloride flush  3 mL Intravenous Q12H   Continuous Infusions: . sodium chloride    .  ceFAZolin (ANCEF) IV 2 g (07/23/19 0639)   PRN Meds: sodium chloride, acetaminophen **OR** acetaminophen, bisacodyl, hydrALAZINE, HYDROcodone-acetaminophen, ondansetron **OR** ondansetron (ZOFRAN) IV, polyethylene glycol, sodium chloride flush  Allergies:   No Known Allergies  Social History:   Social History   Socioeconomic History  . Marital status: Single    Spouse name: Not on file  . Number of children: Not on file  . Years of education: Not on file  . Highest education level: Not on file  Occupational History  . 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 Smoking status: Never Smoker  . Smokeless tobacco: Never Used  Substance and Sexual Activity  . Alcohol use: Yes    Comment: rarely  . Drug use: Never  . Sexual activity: Not on file  Other Topics Concern  . Not on file  Social History Narrative  . Not on file   Social Determinants  of Health   Financial Resource Strain:   . Difficulty of Paying Living Expenses:   Food Insecurity:   . Worried About Charity fundraiser in the Last Year:   . Arboriculturist in the Last Year:   Transportation Needs:   . Film/video editor (Medical):   Marland Kitchen Lack of Transportation (Non-Medical):   Physical Activity:   . Days of Exercise per Week:   . Minutes of Exercise per Session:   Stress:   . Feeling of Stress :   Social Connections:   . Frequency of Communication with Friends and Family:   . Frequency of Social Gatherings with Friends and Family:   . Attends Religious Services:   . Active Member of Clubs or Organizations:   . Attends Archivist Meetings:   Marland Kitchen Marital Status:   Intimate Partner Violence:   . Fear of Current or Ex-Partner:   . Emotionally Abused:   Marland Kitchen Physically Abused:   . Sexually Abused:     Family History:   The patient's family history includes  Osteoporosis in his mother; Other in his father.  ROS:  Please see the history of present illness.  ROS  All other ROS reviewed and negative.     Physical Exam/Data:   Vitals:   07/23/19 0407 07/23/19 0409 07/23/19 0749 07/23/19 0933  BP: (!) 88/62 (!) 87/60 99/73   Pulse: 76 73 76 83  Resp: 20  20 20   Temp: 97.7 F (36.5 C)  98 F (36.7 C) 98 F (36.7 C)  TempSrc: Oral  Oral   SpO2: (!) 87% 94% 92% 91%  Weight:  128.5 kg    Height:        Intake/Output Summary (Last 24 hours) at 07/23/2019 1146 Last data filed at 07/23/2019 0947 Gross per 24 hour  Intake 1210.48 ml  Output 4050 ml  Net -2839.52 ml   Filed Weights   07/21/19 0758 07/23/19 0409  Weight: (!) 140.6 kg 128.5 kg   Body mass index is 38.42 kg/m.  General: Morbidly obese white male. HEENT: normal Lymph: no adenopathy Neck: no JVD Endocrine:  No thryoid Cardiac:  normal S1, S2; RRR; 3 out of 6 harsh systolic ejection murmur Lungs:  clear to auscultation bilaterally, no wheezing, rhonchi or rales  Abd: soft,  nontender, no hepatomegaly  Ext: 2+ pitting bilateral edema with legs wrapped.  Left leg with significant erythema under bandages. Musculoskeletal:  No deformities, BUE and BLE strength normal and equal Skin: warm and dry  Neuro:  CNs 2-12 intact, no focal abnormalities noted Psych:  Normal affect   EKG:  The EKG was personally reviewed and demonstrates: Sinus Telemetry:  Telemetry was personally reviewed and demonstrates: Sinus  Relevant CV Studies: Echo 07/21/19 IMPRESSIONS    1. Left ventricular ejection fraction, by estimation, is 40 to 45%. The  left ventricle has mildly decreased function. The left ventricle  demonstrates global hypokinesis. There is moderate left ventricular  hypertrophy. Left ventricular diastolic  parameters are consistent with Grade II diastolic dysfunction  (pseudonormalization).  2. Right ventricular systolic function is mildly reduced. The right  ventricular size is normal. There is moderately elevated pulmonary artery  systolic pressure.  3. Left atrial size was moderately dilated.  4. The mitral valve is abnormal. Trivial mitral valve regurgitation.  5. Cannot exclude a bicuspid valve. Aortic valve regurgitation is mild.  Critical aortic valve stenosis. Aortic valve area, by VTI measures 0.73  cm. Aortic valve mean gradient measures 96.2 mmHg. Peak gradient of 137  mmHg. Aortic valve Vmax measures  5.86 m/s.  6. The inferior vena cava is dilated in size with <50% respiratory  variability, suggesting right atrial pressure of 15 mmHg.  7. Moderate pleural effusion in the right lateral region.   FINDINGS  Left Ventricle: Left ventricular ejection fraction, by estimation, is 40  to 45%. The left ventricle has mildly decreased function. The left  ventricle demonstrates global hypokinesis. The left ventricular internal  cavity size was normal in size. There is  moderate left ventricular hypertrophy. Left ventricular diastolic  parameters are  consistent with Grade II diastolic dysfunction  (pseudonormalization). Indeterminate filling pressures.   Right Ventricle: The right ventricular size is normal. No increase in  right ventricular wall thickness. Right ventricular systolic function is  mildly reduced. There is moderately elevated pulmonary artery systolic  pressure. The tricuspid regurgitant  velocity is 3.35 m/s, and with an assumed right atrial pressure of 15  mmHg, the estimated right ventricular systolic pressure is 15.4 mmHg.   Left Atrium: Left atrial size was moderately  dilated.   Right Atrium: Right atrial size was normal in size.   Pericardium: There is no evidence of pericardial effusion.   Mitral Valve: The mitral valve is abnormal. Mild to moderate mitral  annular calcification. Trivial mitral valve regurgitation.   Tricuspid Valve: The tricuspid valve is grossly normal. Tricuspid valve  regurgitation is mild.   Aortic Valve: The aortic valve is tricuspid. . There is moderate  thickening and severe calcifcation of the aortic valve. Aortic valve  regurgitation is mild. Aortic regurgitation PHT measures 374 msec. Severe  aortic stenosis is present. There is moderate  thickening of the aortic valve. There is severe calcifcation of the aortic  valve. Aortic valve mean gradient measures 96.2 mmHg. Aortic valve peak  gradient measures 137.3 mmHg. Aortic valve area, by VTI measures 0.73 cm.   Pulmonic Valve: The pulmonic valve was normal in structure. Pulmonic valve  regurgitation is not visualized.   Aorta: The aortic root and ascending aorta are structurally normal, with  no evidence of dilitation.   Venous: The inferior vena cava is dilated in size with less than 50%  respiratory variability, suggesting right atrial pressure of 15 mmHg.   IAS/Shunts: No atrial level shunt detected by color flow Doppler.   Additional Comments: There is a moderate pleural effusion in the right  lateral region.       LEFT VENTRICLE  PLAX 2D  LVIDd:     5.50 cm Diastology  LVIDs:     4.40 cm LV e' lateral:  9.23 cm/s  LV PW:     1.40 cm LV E/e' lateral: 10.2  LV IVS:    1.40 cm LV e' medial:  7.05 cm/s  LVOT diam:   2.50 cm LV E/e' medial: 13.4  LV SV:     108  LV SV Index:  42  LVOT Area:   4.91 cm     RIGHT VENTRICLE  RV S prime:   7.20 cm/s  TAPSE (M-mode): 1.2 cm   LEFT ATRIUM       Index    RIGHT ATRIUM      Index  LA diam:    5.90 cm 2.30 cm/m RA Area:   22.60 cm  LA Vol (A2C):  121.0 ml 47.14 ml/m RA Volume:  68.80 ml 26.80 ml/m  LA Vol (A4C):  95.5 ml 37.21 ml/m  LA Biplane Vol: 110.0 ml 42.86 ml/m  AORTIC VALVE  AV Area (Vmax):  0.82 cm  AV Area (Vmean):  0.72 cm  AV Area (VTI):   0.73 cm  AV Vmax:      585.80 cm/s  AV Vmean:     474.000 cm/s  AV VTI:      1.480 m  AV Peak Grad:   137.3 mmHg  AV Mean Grad:   96.2 mmHg  LVOT Vmax:     97.30 cm/s  LVOT Vmean:    69.800 cm/s  LVOT VTI:     0.220 m  LVOT/AV VTI ratio: 0.15  AI PHT:      374 msec    AORTA  Ao Root diam: 3.00 cm   MITRAL VALVE        TRICUSPID VALVE  MV Area (PHT): 5.54 cm  TR Peak grad:  44.9 mmHg  MV Decel Time: 137 msec  TR Vmax:    335.00 cm/s  MV E velocity: 94.30 cm/s  MV A velocity: 46.30 cm/s SHUNTS  MV E/A ratio: 2.04    Systemic VTI: 0.22 m  Systemic Diam: 2.50 cm   Laboratory Data:  Chemistry Recent Labs  Lab 07/21/19 0820 07/22/19 0823 07/23/19 0622  NA 136 139 138  K 4.2 3.7 3.9  CL 98 99 96*  CO2 26 30 33*  GLUCOSE 153* 102* 112*  BUN 14 13 16   CREATININE 0.93 0.88 0.96  CALCIUM 8.6* 8.6* 8.6*  GFRNONAA >60 >60 >60  GFRAA >60 >60 >60  ANIONGAP 12 10 9     Recent Labs  Lab 07/22/19 0823 07/23/19 0622  PROT 6.7 6.3*  ALBUMIN 3.2* 2.9*  AST 34 27  ALT 29 22  ALKPHOS 264* 233*  BILITOT 3.0* 2.6*    Hematology Recent Labs  Lab 07/21/19 0820 07/22/19 0823 07/23/19 0622  WBC 8.9 7.8 7.3  RBC 4.88 5.00 4.54  HGB 14.5 15.2 13.6  HCT 46.0 46.9 42.5  MCV 94.3 93.8 93.6  MCH 29.7 30.4 30.0  MCHC 31.5 32.4 32.0  RDW 16.5* 16.5* 16.4*  PLT 225 222 197   Cardiac EnzymesNo results for input(s): TROPONINI in the last 168 hours. No results for input(s): TROPIPOC in the last 168 hours.  BNP Recent Labs  Lab 07/21/19 1316  BNP 1,142.6*    DDimer  Recent Labs  Lab 07/21/19 1921 07/22/19 0823 07/23/19 0622  DDIMER 6.01* 5.23* 3.88*    Radiology/Studies:  DG Chest 2 View  Result Date: 07/21/2019 CLINICAL DATA:  Chest pain and shortness of breath EXAM: CHEST - 2 VIEW COMPARISON:  06/25/2018 FINDINGS: Congested appearance of vessels with small pleural effusions. Cardiomegaly that is chronic. Negative aortic contours. IMPRESSION: Cardiomegaly with vascular congestion and small pleural effusions. Electronically Signed   By: Monte Fantasia M.D.   On: 07/21/2019 08:42   ECHOCARDIOGRAM COMPLETE  Result Date: 07/21/2019    ECHOCARDIOGRAM REPORT   Patient Name:   Steve Richards Date of Exam: 07/21/2019 Medical Rec #:  [id number]   Height:       72.0 in Accession #:    [id number]  Weight:       310.0 lb Date of Birth:  [birth year]   BSA:          2.567 m Patient Age:    59 years    BP:           102/73 mmHg Patient Gender: M           HR:           73 bpm. Exam Location:  Inpatient Procedure: 2D Echo Indications:    CHF-Acute Systolic 599.35 / T01.77  History:        Patient has no prior history of Echocardiogram examinations.                 Risk Factors:Obesity. Pleural effusion on right.  Sonographer:    Vikki Ports Turrentine Referring Phys: [place]  1. Left ventricular ejection fraction, by estimation, is 40 to 45%. The left ventricle has mildly decreased function. The left ventricle demonstrates global hypokinesis. There is moderate left ventricular hypertrophy. Left  ventricular diastolic parameters are consistent with Grade II diastolic dysfunction (pseudonormalization).  2. Right ventricular systolic function is mildly reduced. The right ventricular size is normal. There is moderately elevated pulmonary artery systolic pressure.  3. Left atrial size was moderately dilated.  4. The mitral valve is abnormal. Trivial mitral valve regurgitation.  5. Cannot exclude a bicuspid valve. Aortic valve regurgitation is mild. Critical aortic valve stenosis. Aortic valve area, by VTI measures 0.73 cm. Aortic valve mean gradient measures  96.2 mmHg. Peak gradient of 137 mmHg. Aortic valve Vmax measures 5.86 m/s.  6. The inferior vena cava is dilated in size with <50% respiratory variability, suggesting right atrial pressure of 15 mmHg.  7. Moderate pleural effusion in the right lateral region. FINDINGS  Left Ventricle: Left ventricular ejection fraction, by estimation, is 40 to 45%. The left ventricle has mildly decreased function. The left ventricle demonstrates global hypokinesis. The left ventricular internal cavity size was normal in size. There is  moderate left ventricular hypertrophy. Left ventricular diastolic parameters are consistent with Grade II diastolic dysfunction (pseudonormalization). Indeterminate filling pressures. Right Ventricle: The right ventricular size is normal. No increase in right ventricular wall thickness. Right ventricular systolic function is mildly reduced. There is moderately elevated pulmonary artery systolic pressure. The tricuspid regurgitant velocity is 3.35 m/s, and with an assumed right atrial pressure of 15 mmHg, the estimated right ventricular systolic pressure is 44.9 mmHg. Left Atrium: Left atrial size was moderately dilated. Right Atrium: Right atrial size was normal in size. Pericardium: There is no evidence of pericardial effusion. Mitral Valve: The mitral valve is abnormal. Mild to moderate mitral annular calcification. Trivial mitral valve  regurgitation. Tricuspid Valve: The tricuspid valve is grossly normal. Tricuspid valve regurgitation is mild. Aortic Valve: The aortic valve is tricuspid. . There is moderate thickening and severe calcifcation of the aortic valve. Aortic valve regurgitation is mild. Aortic regurgitation PHT measures 374 msec. Severe aortic stenosis is present. There is moderate thickening of the aortic valve. There is severe calcifcation of the aortic valve. Aortic valve mean gradient measures 96.2 mmHg. Aortic valve peak gradient measures 137.3 mmHg. Aortic valve area, by VTI measures 0.73 cm. Pulmonic Valve: The pulmonic valve was normal in structure. Pulmonic valve regurgitation is not visualized. Aorta: The aortic root and ascending aorta are structurally normal, with no evidence of dilitation. Venous: The inferior vena cava is dilated in size with less than 50% respiratory variability, suggesting right atrial pressure of 15 mmHg. IAS/Shunts: No atrial level shunt detected by color flow Doppler. Additional Comments: There is a moderate pleural effusion in the right lateral region.  LEFT VENTRICLE PLAX 2D LVIDd:         5.50 cm  Diastology LVIDs:         4.40 cm  LV e' lateral:   9.23 cm/s LV PW:         1.40 cm  LV E/e' lateral: 10.2 LV IVS:        1.40 cm  LV e' medial:    7.05 cm/s LVOT diam:     2.50 cm  LV E/e' medial:  13.4 LV SV:         108 LV SV Index:   42 LVOT Area:     4.91 cm  RIGHT VENTRICLE RV S prime:     7.20 cm/s TAPSE (M-mode): 1.2 cm LEFT ATRIUM              Index       RIGHT ATRIUM           Index LA diam:        5.90 cm  2.30 cm/m  RA Area:     22.60 cm LA Vol (A2C):   121.0 ml 47.14 ml/m RA Volume:   68.80 ml  26.80 ml/m LA Vol (A4C):   95.5 ml  37.21 ml/m LA Biplane Vol: 110.0 ml 42.86 ml/m  AORTIC VALVE AV Area (Vmax):    0.82 cm AV Area (Vmean):  0.72 cm AV Area (VTI):     0.73 cm AV Vmax:           585.80 cm/s AV Vmean:          474.000 cm/s AV VTI:            1.480 m AV Peak Grad:       137.3 mmHg AV Mean Grad:      96.2 mmHg LVOT Vmax:         97.30 cm/s LVOT Vmean:        69.800 cm/s LVOT VTI:          0.220 m LVOT/AV VTI ratio: 0.15 AI PHT:            374 msec  AORTA Ao Root diam: 3.00 cm MITRAL VALVE               TRICUSPID VALVE MV Area (PHT): 5.54 cm    TR Peak grad:   44.9 mmHg MV Decel Time: 137 msec    TR Vmax:        335.00 cm/s MV E velocity: 94.30 cm/s MV A velocity: 46.30 cm/s  SHUNTS MV E/A ratio:  2.04        Systemic VTI:  0.22 m                            Systemic Diam: 2.50 cm Lyman Bishop MD Electronically signed by Lyman Bishop MD Signature Date/Time: 07/21/2019/3:42:23 PM    Final      [facility] Cardiomyopathy Questionnaire  KCCQ-12 07/23/2019  1 a. Ability to shower/bathe Slightly limited  1 b. Ability to walk 1 block Extremely limited  1 c. Ability to hurry/jog Extremely limited  2. Edema feet/ankles/legs Every morning  3. Limited by fatigue All of the time  5. Sitting up / on 3+ pillows Less than once a week  6. Limited enjoyment of life Extremely limited  7. Rest of life w/ symptoms Not at all satisfied  8 a. Participation in hobbies Severely limited  8 b. Participation in chores Severely limited  8 c. Visiting family/friends Severely limited    _____________________   STS Risk Calculator: Procedure: Isolated AVR  Risk of Mortality:  1.603%   Renal Failure:  2.933%   Permanent Stroke:  0.621%   Prolonged Ventilation:  7.051%   DSW Infection:  0.151%   Reoperation:  3.198%   Morbidity or Mortality:  11.110%   Short Length of Stay:  47.330%   Long Length of Stay:  3.196%      Assessment and Plan:   Steve Richards is a 54 y.o. male with symptoms of severe, stage D1 aortic stenosis with NYHA Class IV symptoms currently admitted for acute CHF and massive volume overload. I have reviewed the patient's recent echocardiogram which is notable for mildly reduced LV systolic function (EF 66-44%) and critical aortic stenosis with peak gradient of  137 mmhg and mean transvalvular gradient of 96.2 mmhg. The patient's dimensionless index is 0.15 and calculated aortic valve area is 0.71 cm. There is mild AI.    I have reviewed the natural history of aortic stenosis with the patient. We have discussed the limitations of medical therapy and the poor prognosis associated with symptomatic aortic stenosis. We have reviewed potential treatment options, including palliative medical therapy, conventional surgical aortic valve replacement, and transcatheter aortic valve replacement. We discussed treatment options in the context of this patient's specific comorbid  medical conditions.    The patient's predicted risk of mortality with conventional aortic valve replacement is 1.603% primarily based on his morbid obesity, acute CHF, liver disease (likely NAFLD), and LV dysfunction. Other significant comorbid conditions include left lower extremity cellulitis.  The patient may have bicuspid aortic valve disease (indeterminate on transthoracic echo).  We will need to proceed with further work-up to decide on best treatment option.  I do not think the patient is at prohibitive surgical risk but may have a prolonged recovery due to above-mentioned comorbidities. TAVR seems like a reasonable treatment option for this patient pending formal cardiac surgical consultation. We discussed typical evaluation which will require a gated cardiac CTA and a CTA of the chest/abdomen/pelvis to evaluate both his cardiac anatomy and peripheral vasculature.  Further testing is complicated by his Covid positive status.  Will discuss with the team on how to best proceed but will likely recommend diuresis, continued quarantine and outpatient work up in the near future.  Dr. Burt Knack to follow.   Signed, Angelena Form, PA-C  07/23/2019 11:46 AM

## 2019-07-23 NOTE — Care Management (Signed)
CM spoke with pt via phone.  Pt is independent from home alone.  Pt states a friend will pick him up a at discharge.  Pt educated on the importance of daily weights and low salt diet.  Pt denied this admit being related to his Worker's Compensation case for his hernia.

## 2019-07-23 NOTE — Consult Note (Addendum)
[place] VALVE TEAM  Inpatient TAVR Consultation:   Patient ID: Steve Richards; [id number]; 1965-04-18   Admit date: 07/21/2019 Date of Consult: 07/23/2019  Primary Care Provider: System, Pcp Not In Primary Cardiologist: Dr. Marlou Porch  Patient Profile:   Steve Richards is a 54 y.o. 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 Marlou Porch.  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  He is not married and has no children.  He lives alone and functions independently.  He drives and takes care of all of his own ADLs.  Of note, he has had regular dental work over the years and has no ongoing dental issues.  He was in his usual state of health until recently when he felt like he pulled his groin while at work. At this time he noted worsening LE edema and also scrotal swelling. He saw the [employer] medical provider and there was a question of a hernia.  An abdominal CT was ordered which showed an incidental large right pleural effusion, cardiomegaly with hepatic venous reflux of contrast material suggesting CHF, cirrhotic morphology to the liver with splenomegaly, ascites, and generalized subcutaneous edema. This prompted a chest CT which showed a moderate right pleural effusion and patchy groundglass opacities throughout the right lung with right middle and lower lobe airspace consolidations. Findings are concerning for a combination of asymmetric edema and/or pneumonia. Advise follow-up to resolution to exclude underlying mass. He underwent ultrasound-guided thoracentesis on 06/25/19 with removal of 1.5 L of fluid and was started on diuretics.  Cytology from pleural fluid showed no evidence of  malignancy.  He also recently injured his left lower extremity on a piece of concrete.  It became red and weepy with copious amounts of fluids.  He was evaluated at an [place] walk-in clinic and started on Keflex and the leg was wrapped.  He was also started on Lasix.  He developed progressively worsening lower extremity edema as well as dyspnea on exertion and eventually presented to [facility] emergency department on 07/21/2019. Pertinent labs include creat 0.93, K 4.2, Hg 14.5, WBC 8.9, HS troponin 47--> 40, lactic acid 1.9, BNP 1,142, Covid positive, Hg A1c 6.1, TSH 14, CRP 9.8,  D dimer 6.01, LDH 317.  2D echo on 07/21/2019 showed mild LV dysfunction with an EF 40 to 45% and global hypokinesis, moderate LVH, grade 2 diastolic dysfunction, mild RV dysfunction, moderately elevated pulmonary artery pressure, moderate LAE, critical aortic stenosis with a mean gradient of 96.2 mmHg, peak gradient 137 mmHg, V-max 5.86 m/s, AVA 0.73 cm2, DVI 0.15, mild aortic regurgitation (bicuspid aortic valve cannot be excluded) and moderate right pleural effusion. LE cellulitis being treated with antibiotics by internal medicine. LE extremity dopplers pending to rule out DVT given elevated Ddimer. The structural heart team is consulted for consideration of aortic valve replacement, either SAVR or TAVR based on work-up.  Today he is seen sitting up in his chair.  He reports worsening dyspnea on exertion over the last several months as well as progressive unexplained weight gain.  He reports worsening lower extremity edema, scrotal edema, abdominal distention.  He reports his baseline weight being around 290 pounds and getting up to 320 pounds recently.  Since being started on IV diuresis he is feeling much better in terms of his breathing.  His legs are currently wrapped.  He denies chest pain.  He denies dizziness or syncope.  He reports worsening fatigue and decreased exercise tolerance. Of note, the patient believes he  had Covid back in October of last year when he lost his sense of taste and smell.  He is currently totally asymptomatic with no Covid type symptoms. He also does not drink alcohol, smoke or use illicit drugs.   Past Medical History:  Diagnosis Date  . Chronic combined systolic (congestive) and diastolic (congestive) heart failure ([place])   . Cirrhosis of liver with ascites ([place])   . Critical aortic valve stenosis   . Morbid obesity ([place])   . Pleural effusion     Past Surgical History:  Procedure Laterality Date  . IR THORACENTESIS ASP PLEURAL SPACE W/IMG GUIDE  06/25/2019     Inpatient Medications: Scheduled Meds: . aspirin EC  81 mg Oral Daily  . docusate sodium  100 mg Oral BID  . enoxaparin (LOVENOX) injection  70 mg Subcutaneous Q24H  . feeding supplement (ENSURE ENLIVE)  237 mL Oral Q1500  . feeding supplement (PRO-STAT SUGAR FREE 64)  30 mL Oral Daily  . furosemide  40 mg Intravenous Q12H  . insulin aspart  0-15 Units Subcutaneous TID WC  . levothyroxine  25 mcg Oral Q0600  . sodium chloride flush  3 mL Intravenous Q12H   Continuous Infusions: . sodium chloride    .  ceFAZolin (ANCEF) IV 2 g (07/23/19 0639)   PRN Meds: sodium chloride, acetaminophen **OR** acetaminophen, bisacodyl, hydrALAZINE, HYDROcodone-acetaminophen, ondansetron **OR** ondansetron (ZOFRAN) IV, polyethylene glycol, sodium chloride flush  Allergies:   No Known Allergies  Social History:   Social History   Socioeconomic History  . Marital status: Single    Spouse name: Not on file  . Number of children: Not on file  . Years of education: Not on file  . Highest education level: Not on file  Occupational History  . 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 Smoking status: Never Smoker  . Smokeless tobacco: Never Used  Substance and Sexual Activity  . Alcohol use: Yes    Comment: rarely  . Drug use: Never  . Sexual activity: Not on file  Other Topics Concern  . Not on  file  Social History Narrative  . Not on file   Social Determinants of Health   Financial Resource Strain:   . Difficulty of Paying Living Expenses:   Food Insecurity:   . Worried About Charity fundraiser in the Last Year:   . Arboriculturist in the Last Year:   Transportation Needs:   . Film/video editor (Medical):   Marland Kitchen Lack of Transportation (Non-Medical):   Physical Activity:   . Days of Exercise per Week:   . Minutes of Exercise per Session:   Stress:   . Feeling of Stress :   Social Connections:   . Frequency of Communication with Friends and Family:   . Frequency of Social Gatherings with Friends and Family:   . Attends Religious Services:   . Active Member of Clubs or Organizations:   . Attends Archivist Meetings:   Marland Kitchen Marital Status:   Intimate Partner Violence:   . Fear of Current or Ex-Partner:   . Emotionally Abused:   Marland Kitchen Physically Abused:   . Sexually Abused:  Family History:   The patient's family history includes Osteoporosis in his mother; Other in his father.  ROS:  Please see the history of present illness.  ROS  All other ROS reviewed and negative.     Physical Exam/Data:   Vitals:   07/23/19 0407 07/23/19 0409 07/23/19 0749 07/23/19 0933  BP: (!) 88/62 (!) 87/60 99/73   Pulse: 76 73 76 83  Resp: 20  20 20   Temp: 97.7 F (36.5 C)  98 F (36.7 C) 98 F (36.7 C)  TempSrc: Oral  Oral   SpO2: (!) 87% 94% 92% 91%  Weight:  128.5 kg    Height:        Intake/Output Summary (Last 24 hours) at 07/23/2019 1146 Last data filed at 07/23/2019 0947 Gross per 24 hour  Intake 1210.48 ml  Output 4050 ml  Net -2839.52 ml   Filed Weights   07/21/19 0758 07/23/19 0409  Weight: (!) 140.6 kg 128.5 kg   Body mass index is 38.42 kg/m.  General: Morbidly obese white male. HEENT: normal Lymph: no adenopathy Neck: no JVD Endocrine:  No thryoid Cardiac:  normal S1, S2; RRR; 3 out of 6 harsh systolic ejection murmur Lungs:  clear to  auscultation bilaterally, no wheezing, rhonchi or rales  Abd: soft, nontender, no hepatomegaly  Ext: 2+ pitting bilateral edema with legs wrapped.  Left leg with significant erythema under bandages. Musculoskeletal:  No deformities, BUE and BLE strength normal and equal Skin: warm and dry  Neuro:  CNs 2-12 intact, no focal abnormalities noted Psych:  Normal affect   EKG:  The EKG was personally reviewed and demonstrates: Sinus Telemetry:  Telemetry was personally reviewed and demonstrates: Sinus  Relevant CV Studies: Echo 07/21/19 IMPRESSIONS    1. Left ventricular ejection fraction, by estimation, is 40 to 45%. The  left ventricle has mildly decreased function. The left ventricle  demonstrates global hypokinesis. There is moderate left ventricular  hypertrophy. Left ventricular diastolic  parameters are consistent with Grade II diastolic dysfunction  (pseudonormalization).  2. Right ventricular systolic function is mildly reduced. The right  ventricular size is normal. There is moderately elevated pulmonary artery  systolic pressure.  3. Left atrial size was moderately dilated.  4. The mitral valve is abnormal. Trivial mitral valve regurgitation.  5. Cannot exclude a bicuspid valve. Aortic valve regurgitation is mild.  Critical aortic valve stenosis. Aortic valve area, by VTI measures 0.73  cm. Aortic valve mean gradient measures 96.2 mmHg. Peak gradient of 137  mmHg. Aortic valve Vmax measures  5.86 m/s.  6. The inferior vena cava is dilated in size with <50% respiratory  variability, suggesting right atrial pressure of 15 mmHg.  7. Moderate pleural effusion in the right lateral region.   FINDINGS  Left Ventricle: Left ventricular ejection fraction, by estimation, is 40  to 45%. The left ventricle has mildly decreased function. The left  ventricle demonstrates global hypokinesis. The left ventricular internal  cavity size was normal in size. There is  moderate left  ventricular hypertrophy. Left ventricular diastolic  parameters are consistent with Grade II diastolic dysfunction  (pseudonormalization). Indeterminate filling pressures.   Right Ventricle: The right ventricular size is normal. No increase in  right ventricular wall thickness. Right ventricular systolic function is  mildly reduced. There is moderately elevated pulmonary artery systolic  pressure. The tricuspid regurgitant  velocity is 3.35 m/s, and with an assumed right atrial pressure of 15  mmHg, the estimated right ventricular systolic pressure is 28.4 mmHg.  Left Atrium: Left atrial size was moderately dilated.   Right Atrium: Right atrial size was normal in size.   Pericardium: There is no evidence of pericardial effusion.   Mitral Valve: The mitral valve is abnormal. Mild to moderate mitral  annular calcification. Trivial mitral valve regurgitation.   Tricuspid Valve: The tricuspid valve is grossly normal. Tricuspid valve  regurgitation is mild.   Aortic Valve: The aortic valve is tricuspid. . There is moderate  thickening and severe calcifcation of the aortic valve. Aortic valve  regurgitation is mild. Aortic regurgitation PHT measures 374 msec. Severe  aortic stenosis is present. There is moderate  thickening of the aortic valve. There is severe calcifcation of the aortic  valve. Aortic valve mean gradient measures 96.2 mmHg. Aortic valve peak  gradient measures 137.3 mmHg. Aortic valve area, by VTI measures 0.73 cm.   Pulmonic Valve: The pulmonic valve was normal in structure. Pulmonic valve  regurgitation is not visualized.   Aorta: The aortic root and ascending aorta are structurally normal, with  no evidence of dilitation.   Venous: The inferior vena cava is dilated in size with less than 50%  respiratory variability, suggesting right atrial pressure of 15 mmHg.   IAS/Shunts: No atrial level shunt detected by color flow Doppler.   Additional Comments: There is  a moderate pleural effusion in the right  lateral region.     LEFT VENTRICLE  PLAX 2D  LVIDd:     5.50 cm Diastology  LVIDs:     4.40 cm LV e' lateral:  9.23 cm/s  LV PW:     1.40 cm LV E/e' lateral: 10.2  LV IVS:    1.40 cm LV e' medial:  7.05 cm/s  LVOT diam:   2.50 cm LV E/e' medial: 13.4  LV SV:     108  LV SV Index:  42  LVOT Area:   4.91 cm     RIGHT VENTRICLE  RV S prime:   7.20 cm/s  TAPSE (M-mode): 1.2 cm   LEFT ATRIUM       Index    RIGHT ATRIUM      Index  LA diam:    5.90 cm 2.30 cm/m RA Area:   22.60 cm  LA Vol (A2C):  121.0 ml 47.14 ml/m RA Volume:  68.80 ml 26.80 ml/m  LA Vol (A4C):  95.5 ml 37.21 ml/m  LA Biplane Vol: 110.0 ml 42.86 ml/m  AORTIC VALVE  AV Area (Vmax):  0.82 cm  AV Area (Vmean):  0.72 cm  AV Area (VTI):   0.73 cm  AV Vmax:      585.80 cm/s  AV Vmean:     474.000 cm/s  AV VTI:      1.480 m  AV Peak Grad:   137.3 mmHg  AV Mean Grad:   96.2 mmHg  LVOT Vmax:     97.30 cm/s  LVOT Vmean:    69.800 cm/s  LVOT VTI:     0.220 m  LVOT/AV VTI ratio: 0.15  AI PHT:      374 msec    AORTA  Ao Root diam: 3.00 cm   MITRAL VALVE        TRICUSPID VALVE  MV Area (PHT): 5.54 cm  TR Peak grad:  44.9 mmHg  MV Decel Time: 137 msec  TR Vmax:    335.00 cm/s  MV E velocity: 94.30 cm/s  MV A velocity: 46.30 cm/s SHUNTS  MV E/A ratio: 2.04    Systemic  VTI: 0.22 m               Systemic Diam: 2.50 cm   Laboratory Data:  Chemistry Recent Labs  Lab 07/21/19 0820 07/22/19 0823 07/23/19 0622  NA 136 139 138  K 4.2 3.7 3.9  CL 98 99 96*  CO2 26 30 33*  GLUCOSE 153* 102* 112*  BUN 14 13 16   CREATININE 0.93 0.88 0.96  CALCIUM 8.6* 8.6* 8.6*  GFRNONAA >60 >60 >60  GFRAA >60 >60 >60  ANIONGAP 12 10 9     Recent Labs  Lab 07/22/19 0823 07/23/19 0622  PROT 6.7 6.3*  ALBUMIN 3.2* 2.9*  AST  34 27  ALT 29 22  ALKPHOS 264* 233*  BILITOT 3.0* 2.6*   Hematology Recent Labs  Lab 07/21/19 0820 07/22/19 0823 07/23/19 0622  WBC 8.9 7.8 7.3  RBC 4.88 5.00 4.54  HGB 14.5 15.2 13.6  HCT 46.0 46.9 42.5  MCV 94.3 93.8 93.6  MCH 29.7 30.4 30.0  MCHC 31.5 32.4 32.0  RDW 16.5* 16.5* 16.4*  PLT 225 222 197   Cardiac EnzymesNo results for input(s): TROPONINI in the last 168 hours. No results for input(s): TROPIPOC in the last 168 hours.  BNP Recent Labs  Lab 07/21/19 1316  BNP 1,142.6*    DDimer  Recent Labs  Lab 07/21/19 1921 07/22/19 0823 07/23/19 0622  DDIMER 6.01* 5.23* 3.88*    Radiology/Studies:  DG Chest 2 View  Result Date: 07/21/2019 CLINICAL DATA:  Chest pain and shortness of breath EXAM: CHEST - 2 VIEW COMPARISON:  06/25/2018 FINDINGS: Congested appearance of vessels with small pleural effusions. Cardiomegaly that is chronic. Negative aortic contours. IMPRESSION: Cardiomegaly with vascular congestion and small pleural effusions. Electronically Signed   By: Monte Fantasia M.D.   On: 07/21/2019 08:42   ECHOCARDIOGRAM COMPLETE  Result Date: 07/21/2019    ECHOCARDIOGRAM REPORT   Patient Name:   CHETAN MEHRING Date of Exam: 07/21/2019 Medical Rec #:  [id number]   Height:       72.0 in Accession #:    [id number]  Weight:       310.0 lb Date of Birth:  August 14, 1965   BSA:          2.567 m Patient Age:    2 years    BP:           102/73 mmHg Patient Gender: M           HR:           73 bpm. Exam Location:  Inpatient Procedure: 2D Echo Indications:    CHF-Acute Systolic 329.51 / O84.16  History:        Patient has no prior history of Echocardiogram examinations.                 Risk Factors:Obesity. Pleural effusion on right.  Sonographer:    Vikki Ports Turrentine Referring Phys: [place]  1. Left ventricular ejection fraction, by estimation, is 40 to 45%. The left ventricle has mildly decreased function. The left ventricle demonstrates global hypokinesis.  There is moderate left ventricular hypertrophy. Left ventricular diastolic parameters are consistent with Grade II diastolic dysfunction (pseudonormalization).  2. Right ventricular systolic function is mildly reduced. The right ventricular size is normal. There is moderately elevated pulmonary artery systolic pressure.  3. Left atrial size was moderately dilated.  4. The mitral valve is abnormal. Trivial mitral valve regurgitation.  5. Cannot exclude a bicuspid valve. Aortic valve regurgitation is mild.  Critical aortic valve stenosis. Aortic valve area, by VTI measures 0.73 cm. Aortic valve mean gradient measures 96.2 mmHg. Peak gradient of 137 mmHg. Aortic valve Vmax measures 5.86 m/s.  6. The inferior vena cava is dilated in size with <50% respiratory variability, suggesting right atrial pressure of 15 mmHg.  7. Moderate pleural effusion in the right lateral region. FINDINGS  Left Ventricle: Left ventricular ejection fraction, by estimation, is 40 to 45%. The left ventricle has mildly decreased function. The left ventricle demonstrates global hypokinesis. The left ventricular internal cavity size was normal in size. There is  moderate left ventricular hypertrophy. Left ventricular diastolic parameters are consistent with Grade II diastolic dysfunction (pseudonormalization). Indeterminate filling pressures. Right Ventricle: The right ventricular size is normal. No increase in right ventricular wall thickness. Right ventricular systolic function is mildly reduced. There is moderately elevated pulmonary artery systolic pressure. The tricuspid regurgitant velocity is 3.35 m/s, and with an assumed right atrial pressure of 15 mmHg, the estimated right ventricular systolic pressure is 99.2 mmHg. Left Atrium: Left atrial size was moderately dilated. Right Atrium: Right atrial size was normal in size. Pericardium: There is no evidence of pericardial effusion. Mitral Valve: The mitral valve is abnormal. Mild to moderate  mitral annular calcification. Trivial mitral valve regurgitation. Tricuspid Valve: The tricuspid valve is grossly normal. Tricuspid valve regurgitation is mild. Aortic Valve: The aortic valve is tricuspid. . There is moderate thickening and severe calcifcation of the aortic valve. Aortic valve regurgitation is mild. Aortic regurgitation PHT measures 374 msec. Severe aortic stenosis is present. There is moderate thickening of the aortic valve. There is severe calcifcation of the aortic valve. Aortic valve mean gradient measures 96.2 mmHg. Aortic valve peak gradient measures 137.3 mmHg. Aortic valve area, by VTI measures 0.73 cm. Pulmonic Valve: The pulmonic valve was normal in structure. Pulmonic valve regurgitation is not visualized. Aorta: The aortic root and ascending aorta are structurally normal, with no evidence of dilitation. Venous: The inferior vena cava is dilated in size with less than 50% respiratory variability, suggesting right atrial pressure of 15 mmHg. IAS/Shunts: No atrial level shunt detected by color flow Doppler. Additional Comments: There is a moderate pleural effusion in the right lateral region.  LEFT VENTRICLE PLAX 2D LVIDd:         5.50 cm  Diastology LVIDs:         4.40 cm  LV e' lateral:   9.23 cm/s LV PW:         1.40 cm  LV E/e' lateral: 10.2 LV IVS:        1.40 cm  LV e' medial:    7.05 cm/s LVOT diam:     2.50 cm  LV E/e' medial:  13.4 LV SV:         108 LV SV Index:   42 LVOT Area:     4.91 cm  RIGHT VENTRICLE RV S prime:     7.20 cm/s TAPSE (M-mode): 1.2 cm LEFT ATRIUM              Index       RIGHT ATRIUM           Index LA diam:        5.90 cm  2.30 cm/m  RA Area:     22.60 cm LA Vol (A2C):   121.0 ml 47.14 ml/m RA Volume:   68.80 ml  26.80 ml/m LA Vol (A4C):   95.5 ml  37.21 ml/m LA Biplane Vol: 110.0 ml  42.86 ml/m  AORTIC VALVE AV Area (Vmax):    0.82 cm AV Area (Vmean):   0.72 cm AV Area (VTI):     0.73 cm AV Vmax:           585.80 cm/s AV Vmean:          474.000 cm/s  AV VTI:            1.480 m AV Peak Grad:      137.3 mmHg AV Mean Grad:      96.2 mmHg LVOT Vmax:         97.30 cm/s LVOT Vmean:        69.800 cm/s LVOT VTI:          0.220 m LVOT/AV VTI ratio: 0.15 AI PHT:            374 msec  AORTA Ao Root diam: 3.00 cm MITRAL VALVE               TRICUSPID VALVE MV Area (PHT): 5.54 cm    TR Peak grad:   44.9 mmHg MV Decel Time: 137 msec    TR Vmax:        335.00 cm/s MV E velocity: 94.30 cm/s MV A velocity: 46.30 cm/s  SHUNTS MV E/A ratio:  2.04        Systemic VTI:  0.22 m                            Systemic Diam: 2.50 cm Lyman Bishop MD Electronically signed by Lyman Bishop MD Signature Date/Time: 07/21/2019/3:42:23 PM    Final      [facility] Cardiomyopathy Questionnaire  KCCQ-12 07/23/2019  1 a. Ability to shower/bathe Slightly limited  1 b. Ability to walk 1 block Extremely limited  1 c. Ability to hurry/jog Extremely limited  2. Edema feet/ankles/legs Every morning  3. Limited by fatigue All of the time  5. Sitting up / on 3+ pillows Less than once a week  6. Limited enjoyment of life Extremely limited  7. Rest of life w/ symptoms Not at all satisfied  8 a. Participation in hobbies Severely limited  8 b. Participation in chores Severely limited  8 c. Visiting family/friends Severely limited    _____________________   STS Risk Calculator: Procedure: Isolated AVR  Risk of Mortality:  1.603%   Renal Failure:  2.933%   Permanent Stroke:  0.621%   Prolonged Ventilation:  7.051%   DSW Infection:  0.151%   Reoperation:  3.198%   Morbidity or Mortality:  11.110%   Short Length of Stay:  47.330%   Long Length of Stay:  3.196%      Assessment and Plan:   Orlandus Borowski is a 54 y.o. male with symptoms of severe, stage D1 aortic stenosis with NYHA Class IV symptoms currently admitted for acute CHF and massive volume overload. I have reviewed the patient's recent echocardiogram which is notable for mildly reduced LV systolic function (EF 29-56%) and  critical aortic stenosis with peak gradient of 137 mmhg and mean transvalvular gradient of 96.2 mmhg. The patient's dimensionless index is 0.15 and calculated aortic valve area is 0.71 cm. There is mild AI.    I have reviewed the natural history of aortic stenosis with the patient. We have discussed the limitations of medical therapy and the poor prognosis associated with symptomatic aortic stenosis. We have reviewed potential treatment options, including palliative medical therapy, conventional surgical aortic valve  replacement, and transcatheter aortic valve replacement. We discussed treatment options in the context of this patient's specific comorbid medical conditions.    The patient's predicted risk of mortality with conventional aortic valve replacement is 1.603% primarily based on his morbid obesity, acute CHF, liver disease (likely NAFLD), and LV dysfunction. Other significant comorbid conditions include left lower extremity cellulitis.  The patient may have bicuspid aortic valve disease (indeterminate on transthoracic echo).  We will need to proceed with further work-up to decide on best treatment option.  I do not think the patient is at prohibitive surgical risk but may have a prolonged recovery due to above-mentioned comorbidities. TAVR seems like a reasonable treatment option for this patient pending formal cardiac surgical consultation. We discussed typical evaluation which will require a gated cardiac CTA and a CTA of the chest/abdomen/pelvis to evaluate both his cardiac anatomy and peripheral vasculature.  Further testing is complicated by his Covid positive status.  Will discuss with the team on how to best proceed but will likely recommend diuresis, continued quarantine and outpatient work up in the near future.  Dr. Burt Knack to follow.   Signed, Angelena Form, PA-C  07/23/2019 11:46 AM  Patient seen, examined. Available data reviewed. Agree with findings, assessment, and plan as  outlined by Nell Range, PA-C. The patient is independently interviewed and examined. The patient is hospitalized with massive edema, newly diagnosed heart failure. Pertinent findings of recent evaluation includes the presence of critical aortic stenosis, bilateral pleural effusions, and cirrhosis. He has left leg cellulitis and has been transitioned from oral Keflex to broad spectrum IV antibiotics. The patient has tested positive for Covid-19 but has no symptoms of fever, chills, respiratory symptoms, or loss of taste/smell. He did have typical symptoms of Covid 19 infection in October 2020 but did not get tested at that time. We are asked to see the patient regarding treatment options for critical aortic stenosis.  On my exam, he is alert, oriented, in NAD. The patient is obese. HEENT is normal, JVP is elevated, there are BL carotid bruits. Heart is RRR with a 3/6 harsh systolic murmur appreciated at the RUSB and LLSB, absent A2. Lungs are diminished in the bases, otherwise clear. Abdomen is soft, obese, NT. Extremities have pitting edema into the thighs bilaterally. The lower legs are wrapped to the toes.   The patient's echo is reviewed and shows mild global LV dysfunction with LVEF 40-45%, normal RV function, moderately elevated RVSP, critical AS with peak and mean gradients of 137 and 96 mmHg, respectively. Peak transaortic velocity is 5.9 m/s, DI is 0.15. The aortic leaflets demonstrate bulky calcification and appear nearly fixed.   We discussed treatment options as outlined above. We specifically discussed pros and cons of TAVR versus conventional surgery. This discussion occurs in the context of his current illness. While he will need aortic valve replacement in the near future, this has to be balanced with his risk of infectious endocarditis in the setting of cellulitis. I have recommended proceeding with a CTA of the heart (TAVR protocol) and CTA of the chest, abdomen, and pelvis to evaluate for  peripheral access. Will then proceed with right and left heart catheterization early next week to evaluate right heart pressures and the presence of coronary artery disease. I have reviewed the risks, indications, and alternatives to cardiac catheterization, possible angioplasty, and stenting with the patient. Risks include but are not limited to bleeding, infection, vascular injury, stroke, myocardial infection, arrhythmia, kidney injury, radiation-related injury in the case  of prolonged fluoroscopy use, emergency cardiac surgery, and death. The patient understands the risks of serious complication is 1-2 in 8638 with diagnostic cardiac cath and 1-2% or less with angioplasty/stenting. Once these studies are completed, he will undergo formal cardiac surgical consultation. Despite his obesity, he is relatively young and conventional surgery might provide him with the best long-term outlook. However, the patient is reluctant to have surgery in part because of his limited social support and likely need for at least short term care as he has nobody to help support him locally. Our valve team will consider all of this as his evaluation continues. Will follow-up early next week and will try to arrange CT studies over the weekend. In the meantime, he will require ongoing IV diuresis as he remains markedly volume overloaded.   Sherren Mocha, M.D. 07/24/2019 4:40 PM

## 2019-07-23 NOTE — Progress Notes (Signed)
PROGRESS NOTE                                                                                                                                                                                                             Patient Demographics:    Steve Richards, is a 54 y.o. male, DOB - 1965-06-29, DDU:[id number]  Admit date - 07/21/2019   Admitting Physician Karmen Bongo, MD  Outpatient Primary MD for the patient is System, Pcp Not In  LOS - 2   Chief Complaint  Patient presents with  . Shortness of Breath  . Leg Swelling       Brief Narrative     Steve Richards is a 54 y.o. male with medical history significant of R pleural effusion in April 2021 presenting with SOB and LE edema.  Last time he was in the hospital was when he was 54yo.  Recently, he had a thoracentesis.  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  He saw the [employer] doctor, ?hernia, sent to Dr. Hassell Done (surgery).  CT was ordered and it showed a moderate pleural effusion in February.  He had significant mostly lower body edema.  He is not sure why he has the edema.  Thoracentesis was done with removal of about 1.5L fluid and he was started on diuretics.  This has been a very slow process since this has happened through [employer] but he has now been released from that.  He saw Dr. Hassell Done to get the results from the thora on Thursday - no malignancy and unremarkable evaluation (no PNA or cancer).  They discussed his LLE wound that isn't helping - so much fluid that he can't bend his knee and hit his leg on the concrete on April 30.  It was originally weepy with copious amounts of fluid.  He went to [facility] and was started on Keflex and wrapped with gauze.  He started the antibiotics over the weekend along with Lasix.  The Lasix led to diuresis with the initial dose, but less now.  The antibiotics did not appear to  make a different.  No fever.  Significant DOE, which has worsened since November.  He did have orthopnea prior to the thoracentesis, but he has not  been lying flat since then.  He did feel significantly better after the thoracentesis but has slowly gotten worse since then.  Occasional cough, dry.  Rare wheezing.    ED Course:  Subacute LE edema, fluid overloaded.  On Lasix and still overloaded.  Also with poor wound healing; placed on Keflex a few days ago without improvement.  Giving Vanc, Lasix.     Subjective:    Steve Richards today has, No headache, No chest pain, No abdominal pain, still reports some dyspnea with activity, denies any fever or chills.  .   Assessment  & Plan :    Principal Problem:   New onset of congestive heart failure (HCC) Active Problems:   Hepatic cirrhosis (HCC)   Pleural effusion on right   Systolic ejection murmur   Cellulitis and abscess of left lower extremity   Hyperglycemia   Obesity, Class III, BMI 40-49.9 (morbid obesity) (HCC)   Acute systolic/diastolic CHF -Did not follow with his PCP for last 6 years, with poor outpatient follow-up. -Evidence of volume overload on imaging, recurrent pleural effusion, elevated BNP and lower extremity edema. -Continue with IV diuresis at 40 mg IV twice daily, 96 L since admission, but appears to be massively volume overloaded, will continue with current dose, will monitor closely given soft blood pressure. -2D echo significant for low EF 40 to 45%, with grade 2 diastolic CHF, cardiology has been consulted regarding further recommendations.  Severe aortic stenosis -Have discussed with cardiology, appears to severe, and likely contributing to multiple comorbidities, possibly causing cardiac cirrhosis, his lower extremity edema contributing to venous stasis and cellulitis   COVID-19 infection -Patient denies any fever, cough currently, no evidence of pneumonia on imaging, he has no hypoxia, so for now no indication  for steroids and remdesivir, but will continue to monitor closely especially with significantly elevated inflammatory markers. -D-dimers elevated at 6 on admission, it is contrary it is currently trending down, continue to monitor closely, venous Doppler pending to rule out DVT .  COVID-19 Labs  Recent Labs    07/21/19 1921 07/22/19 0823 07/23/19 0622  DDIMER 6.01* 5.23* 3.88*  FERRITIN 235 207 203  LDH 317*  --   --   CRP 9.8* 8.0* 6.0*    Lab Results  Component Value Date   SARSCOV2NAA POSITIVE (A) 07/21/2019   [place] NEGATIVE 06/23/2019   Liver cirrhosis -Patient denies any history of alcohol abuse in the past, this is most likely related to cardiac cirrhosis versus NASH. -Right upper quadrant ultrasound. -We will DC Aldactone given soft blood pressure  Left lower extremity cellulitis -He appears to be having left lower extremity cellulitis, which did not respond to Keflex, as well there is evidence of venous stasis disease contributing to his infection, as well significant lower extremity edema as well. -Initially on IV vancomycin, will change to IV cefazolin  Hyperglycemia -A1c is 6.1  Morbid obesity  Hypothyroidism -TSH elevated at 14, with low normal free T4 at 0.65, started on low-dose Synthroid   Code Status : Full  Disposition Plan  :  Status is: Inpatient  Remains inpatient appropriate because:IV treatments appropriate due to intensity of illness or inability to take PO   Dispo: The patient is from: Home              Anticipated d/c is to: Home              Anticipated d/c date is: > 3 days  Patient currently is not medically stable to d/c.         Consults  :  Cardiology  Procedures  : None  DVT Prophylaxis  :  [place] lovenox  Lab Results  Component Value Date   PLT 197 07/23/2019    Antibiotics  :   Anti-infectives (From admission, onward)   Start     Dose/Rate Route Frequency Ordered Stop   07/22/19 1400  ceFAZolin  (ANCEF) IVPB 2g/100 mL premix     2 g 200 mL/hr over 30 Minutes Intravenous Every 8 hours 07/22/19 1223     07/22/19 0000  vancomycin (VANCOREADY) IVPB 1250 mg/250 mL  Status:  Discontinued     1,250 mg 166.7 mL/hr over 90 Minutes Intravenous Every 12 hours 07/21/19 1141 07/22/19 1223   07/21/19 1145  vancomycin (VANCOREADY) IVPB 2000 mg/400 mL     2,000 mg 200 mL/hr over 120 Minutes Intravenous  Once 07/21/19 1141 07/21/19 1509        Objective:   Vitals:   07/23/19 0407 07/23/19 0409 07/23/19 0749 07/23/19 0933  BP: (!) 88/62 (!) 87/60 99/73   Pulse: 76 73 76 83  Resp: 20  20 20   Temp: 97.7 F (36.5 C)  98 F (36.7 C) 98 F (36.7 C)  TempSrc: Oral  Oral   SpO2: (!) 87% 94% 92% 91%  Weight:  128.5 kg    Height:        Wt Readings from Last 3 Encounters:  07/23/19 128.5 kg  09/24/11 125.2 kg     Intake/Output Summary (Last 24 hours) at 07/23/2019 1114 Last data filed at 07/23/2019 0947 Gross per 24 hour  Intake 1210.48 ml  Output 4050 ml  Net -2839.52 ml     Physical Exam  Awake Alert, Oriented X 3, No new F.N deficits, Normal affect Symmetrical Chest wall movement, Good air movement bilaterally, CTAB RRR,No Gallops,Rubs , + Murmurs, No Parasternal Heave +ve B.Sounds, Abd Soft, No tenderness, No rebound - guarding or rigidity.   No Cyanosis, lateral lower extremity edema +2, with left lower extremity superficial wounds with sanguinous discharge, and some significant erythema .       Data Review:    CBC Recent Labs  Lab 07/21/19 0820 07/22/19 0823 07/23/19 0622  WBC 8.9 7.8 7.3  HGB 14.5 15.2 13.6  HCT 46.0 46.9 42.5  PLT 225 222 197  MCV 94.3 93.8 93.6  MCH 29.7 30.4 30.0  MCHC 31.5 32.4 32.0  RDW 16.5* 16.5* 16.4*  LYMPHSABS  --  0.9 0.9  MONOABS  --  0.6 0.6  EOSABS  --  0.1 0.1  BASOSABS  --  0.0 0.1    Chemistries  Recent Labs  Lab 07/21/19 0820 07/22/19 0823 07/23/19 0622  NA 136 139 138  K 4.2 3.7 3.9  CL 98 99 96*  CO2 26 30  33*  GLUCOSE 153* 102* 112*  BUN 14 13 16   CREATININE 0.93 0.88 0.96  CALCIUM 8.6* 8.6* 8.6*  MG  --  1.8 1.8  AST  --  34 27  ALT  --  29 22  ALKPHOS  --  264* 233*  BILITOT  --  3.0* 2.6*   ------------------------------------------------------------------------------------------------------------------ Recent Labs    07/21/19 1515  CHOL 128  HDL 37*  LDLCALC 74  TRIG 87  CHOLHDL 3.5    Lab Results  Component Value Date   HGBA1C 6.1 (H) 07/21/2019   ------------------------------------------------------------------------------------------------------------------ Recent Labs    07/21/19 1515  TSH  14.146*   ------------------------------------------------------------------------------------------------------------------ Recent Labs    07/22/19 0823 07/23/19 0622  FERRITIN 207 203    Coagulation profile No results for input(s): INR, PROTIME in the last 168 hours.  Recent Labs    07/22/19 0823 07/23/19 0622  DDIMER 5.23* 3.88*    Cardiac Enzymes No results for input(s): CKMB, TROPONINI, MYOGLOBIN in the last 168 hours.  Invalid input(s): CK ------------------------------------------------------------------------------------------------------------------    Component Value Date/Time   BNP 1,142.6 (H) 07/21/2019 1316    Inpatient Medications  Scheduled Meds: . aspirin EC  81 mg Oral Daily  . docusate sodium  100 mg Oral BID  . enoxaparin (LOVENOX) injection  70 mg Subcutaneous Q24H  . feeding supplement (ENSURE ENLIVE)  237 mL Oral Q1500  . feeding supplement (PRO-STAT SUGAR FREE 64)  30 mL Oral Daily  . furosemide  40 mg Intravenous Q12H  . insulin aspart  0-15 Units Subcutaneous TID WC  . levothyroxine  25 mcg Oral Q0600  . sodium chloride flush  3 mL Intravenous Q12H  . spironolactone  50 mg Oral Daily   Continuous Infusions: . sodium chloride    .  ceFAZolin (ANCEF) IV 2 g (07/23/19 0639)   PRN Meds:.sodium chloride, acetaminophen **OR**  acetaminophen, bisacodyl, hydrALAZINE, HYDROcodone-acetaminophen, morphine injection, ondansetron **OR** ondansetron (ZOFRAN) IV, polyethylene glycol, sodium chloride flush  Micro Results Recent Results (from the past 240 hour(s))  Culture, blood (routine x 2)     Status: None (Preliminary result)   Collection Time: 07/21/19 12:37 PM   Specimen: BLOOD RIGHT HAND  Result Value Ref Range Status   Specimen Description BLOOD RIGHT HAND  Final   Special Requests   Final    BOTTLES DRAWN AEROBIC ONLY Blood Culture results may not be optimal due to an inadequate volume of blood received in culture bottles   Culture   Final    NO GROWTH 2 DAYS Performed at [facility], [place] [address], [place], [place] [address]    Report Status PENDING  Incomplete  SARS Coronavirus 2 by RT PCR (hospital order, performed in [facility] hospital lab) Nasopharyngeal Nasopharyngeal Swab     Status: Abnormal   Collection Time: 07/21/19 12:37 PM   Specimen: Nasopharyngeal Swab  Result Value Ref Range Status   SARS Coronavirus 2 POSITIVE (A) NEGATIVE Final    Comment: RESULT CALLED TO, READ BACK BY AND VERIFIED WITH: Angelina Ok RN 16:45 07/21/19 ([email or web address]) (NOTE) SARS-CoV-2 target nucleic acids are DETECTED SARS-CoV-2 RNA is generally detectable in upper respiratory specimens  during the acute phase of infection.  Positive results are indicative  of the presence of the identified virus, but do not rule out bacterial infection or co-infection with other pathogens not detected by the test.  Clinical correlation with patient history and  other diagnostic information is necessary to determine patient infection status.  The expected result is negative. Fact Sheet for Patients:   StrictlyIdeas.no  Fact Sheet for Healthcare Providers:   BankingDealers.co.za   This test is not yet approved or cleared by the [place] FDA and  has been authorized for  detection and/or diagnosis of SARS-CoV-2 by FDA under an Emergency Use Authorization (EUA).  This EUA will remain in effect (meaning this  test can be used) for the duration of  the COVID-19 declaration under Section 564(b)(1) of the Act, 21 U.S.C. section 360-bbb-3(b)(1), unless the authorization is terminated or revoked sooner. Performed at [facility], [place] [address], [place],  [address]  Culture, blood (routine x 2)     Status: None (Preliminary result)   Collection Time: 07/21/19 12:38 PM   Specimen: BLOOD  Result Value Ref Range Status   Specimen Description BLOOD RIGHT ANTECUBITAL  Final   Special Requests   Final    BOTTLES DRAWN AEROBIC AND ANAEROBIC Blood Culture results may not be optimal due to an inadequate volume of blood received in culture bottles   Culture   Final    NO GROWTH 2 DAYS Performed at [facility], [place] [address], [place], [place] [address]    Report Status PENDING  Incomplete    Radiology Reports DG Chest 1 View  Result Date: 06/25/2019 CLINICAL DATA:  Status post right thoracentesis today. EXAM: CHEST  1 VIEW COMPARISON:  None. FINDINGS: No pneumothorax is seen after thoracentesis. Only a small residual pleural effusion is identified. The patient has a small left pleural effusion. There is cardiomegaly without edema. IMPRESSION: Negative for pneumothorax after thoracentesis. Small bilateral pleural effusions. Cardiomegaly. Electronically Signed   By: Inge Rise M.D.   On: 06/25/2019 15:22   DG Chest 2 View  Result Date: 07/21/2019 CLINICAL DATA:  Chest pain and shortness of breath EXAM: CHEST - 2 VIEW COMPARISON:  06/25/2018 FINDINGS: Congested appearance of vessels with small pleural effusions. Cardiomegaly that is chronic. Negative aortic contours. IMPRESSION: Cardiomegaly with vascular congestion and small pleural effusions. Electronically Signed   By: Monte Fantasia M.D.   On: 07/21/2019 08:42   ECHOCARDIOGRAM  COMPLETE  Result Date: 07/21/2019    ECHOCARDIOGRAM REPORT   Patient Name:   Steve Richards Date of Exam: 07/21/2019 Medical Rec #:  [id number]   Height:       72.0 in Accession #:    [id number]  Weight:       310.0 lb Date of Birth:  March 30, 1965   BSA:          2.567 m Patient Age:    25 years    BP:           102/73 mmHg Patient Gender: M           HR:           73 bpm. Exam Location:  Inpatient Procedure: 2D Echo Indications:    CHF-Acute Systolic 053.97 / Q73.41  History:        Patient has no prior history of Echocardiogram examinations.                 Risk Factors:Obesity. Pleural effusion on right.  Sonographer:    Vikki Ports Turrentine Referring Phys: [place]  1. Left ventricular ejection fraction, by estimation, is 40 to 45%. The left ventricle has mildly decreased function. The left ventricle demonstrates global hypokinesis. There is moderate left ventricular hypertrophy. Left ventricular diastolic parameters are consistent with Grade II diastolic dysfunction (pseudonormalization).  2. Right ventricular systolic function is mildly reduced. The right ventricular size is normal. There is moderately elevated pulmonary artery systolic pressure.  3. Left atrial size was moderately dilated.  4. The mitral valve is abnormal. Trivial mitral valve regurgitation.  5. Cannot exclude a bicuspid valve. Aortic valve regurgitation is mild. Critical aortic valve stenosis. Aortic valve area, by VTI measures 0.73 cm. Aortic valve mean gradient measures 96.2 mmHg. Peak gradient of 137 mmHg. Aortic valve Vmax measures 5.86 m/s.  6. The inferior vena cava is dilated in size with <50% respiratory variability, suggesting right atrial pressure of 15 mmHg.  7. Moderate pleural  effusion in the right lateral region. FINDINGS  Left Ventricle: Left ventricular ejection fraction, by estimation, is 40 to 45%. The left ventricle has mildly decreased function. The left ventricle demonstrates global hypokinesis. The left  ventricular internal cavity size was normal in size. There is  moderate left ventricular hypertrophy. Left ventricular diastolic parameters are consistent with Grade II diastolic dysfunction (pseudonormalization). Indeterminate filling pressures. Right Ventricle: The right ventricular size is normal. No increase in right ventricular wall thickness. Right ventricular systolic function is mildly reduced. There is moderately elevated pulmonary artery systolic pressure. The tricuspid regurgitant velocity is 3.35 m/s, and with an assumed right atrial pressure of 15 mmHg, the estimated right ventricular systolic pressure is 16.1 mmHg. Left Atrium: Left atrial size was moderately dilated. Right Atrium: Right atrial size was normal in size. Pericardium: There is no evidence of pericardial effusion. Mitral Valve: The mitral valve is abnormal. Mild to moderate mitral annular calcification. Trivial mitral valve regurgitation. Tricuspid Valve: The tricuspid valve is grossly normal. Tricuspid valve regurgitation is mild. Aortic Valve: The aortic valve is tricuspid. . There is moderate thickening and severe calcifcation of the aortic valve. Aortic valve regurgitation is mild. Aortic regurgitation PHT measures 374 msec. Severe aortic stenosis is present. There is moderate thickening of the aortic valve. There is severe calcifcation of the aortic valve. Aortic valve mean gradient measures 96.2 mmHg. Aortic valve peak gradient measures 137.3 mmHg. Aortic valve area, by VTI measures 0.73 cm. Pulmonic Valve: The pulmonic valve was normal in structure. Pulmonic valve regurgitation is not visualized. Aorta: The aortic root and ascending aorta are structurally normal, with no evidence of dilitation. Venous: The inferior vena cava is dilated in size with less than 50% respiratory variability, suggesting right atrial pressure of 15 mmHg. IAS/Shunts: No atrial level shunt detected by color flow Doppler. Additional Comments: There is a  moderate pleural effusion in the right lateral region.  LEFT VENTRICLE PLAX 2D LVIDd:         5.50 cm  Diastology LVIDs:         4.40 cm  LV e' lateral:   9.23 cm/s LV PW:         1.40 cm  LV E/e' lateral: 10.2 LV IVS:        1.40 cm  LV e' medial:    7.05 cm/s LVOT diam:     2.50 cm  LV E/e' medial:  13.4 LV SV:         108 LV SV Index:   42 LVOT Area:     4.91 cm  RIGHT VENTRICLE RV S prime:     7.20 cm/s TAPSE (M-mode): 1.2 cm LEFT ATRIUM              Index       RIGHT ATRIUM           Index LA diam:        5.90 cm  2.30 cm/m  RA Area:     22.60 cm LA Vol (A2C):   121.0 ml 47.14 ml/m RA Volume:   68.80 ml  26.80 ml/m LA Vol (A4C):   95.5 ml  37.21 ml/m LA Biplane Vol: 110.0 ml 42.86 ml/m  AORTIC VALVE AV Area (Vmax):    0.82 cm AV Area (Vmean):   0.72 cm AV Area (VTI):     0.73 cm AV Vmax:           585.80 cm/s AV Vmean:  474.000 cm/s AV VTI:            1.480 m AV Peak Grad:      137.3 mmHg AV Mean Grad:      96.2 mmHg LVOT Vmax:         97.30 cm/s LVOT Vmean:        69.800 cm/s LVOT VTI:          0.220 m LVOT/AV VTI ratio: 0.15 AI PHT:            374 msec  AORTA Ao Root diam: 3.00 cm MITRAL VALVE               TRICUSPID VALVE MV Area (PHT): 5.54 cm    TR Peak grad:   44.9 mmHg MV Decel Time: 137 msec    TR Vmax:        335.00 cm/s MV E velocity: 94.30 cm/s MV A velocity: 46.30 cm/s  SHUNTS MV E/A ratio:  2.04        Systemic VTI:  0.22 m                            Systemic Diam: 2.50 cm Lyman Bishop MD Electronically signed by Lyman Bishop MD Signature Date/Time: 07/21/2019/3:42:23 PM    Final    IR THORACENTESIS ASP PLEURAL SPACE W/IMG GUIDE  Result Date: 06/25/2019 INDICATION: Patient with history of bilateral pleural effusions, right greater than left. Request made for diagnostic and therapeutic right thoracentesis EXAM: ULTRASOUND GUIDED DIAGNOSTIC AND THERAPEUTIC RIGHT THORACENTESIS MEDICATIONS: None COMPLICATIONS: None immediate. PROCEDURE: An ultrasound guided thoracentesis was  thoroughly discussed with the patient and questions answered. The benefits, risks, alternatives and complications were also discussed. The patient understands and wishes to proceed with the procedure. Written consent was obtained. Ultrasound was performed to localize and mark an adequate pocket of fluid in the right chest. The area was then prepped and draped in the normal sterile fashion. 1% Lidocaine was used for local anesthesia. Under ultrasound guidance a 6 Fr Safe-T-Centesis catheter was introduced. Thoracentesis was performed. The catheter was removed and a dressing applied. FINDINGS: A total of approximately 1.5 liters of yellow fluid was removed. Samples were sent to the laboratory as requested by the clinical team. IMPRESSION: Successful ultrasound guided diagnostic and therapeutic right thoracentesis yielding 1.5 liters of pleural fluid. Read by: Rowe Robert, PA-C Electronically Signed   By: Corrie Mckusick D.O.   On: 06/25/2019 15:38    Phillips Climes M.D on 07/23/2019 at 11:14 AM  Between 7am to 7pm - Pager - [phone]  After 7pm go to [email or web address] - password [facility]  [facility] -  Office  [phone]

## 2019-07-23 NOTE — Progress Notes (Signed)
Lower extremity venous has been completed.   Preliminary results in CV Proc.   Abram Sander 07/23/2019 3:29 PM

## 2019-07-23 NOTE — Progress Notes (Signed)
    To be seen today by structural heart team for evaluation of severe/critical aortic stenosis.  Please see my prior consult note for full details.  Candee Furbish, MD

## 2019-07-23 NOTE — Plan of Care (Signed)
  Problem: Clinical Measurements: Goal: Respiratory complications will improve Outcome: Progressing   

## 2019-07-23 NOTE — Progress Notes (Addendum)
Physical Therapy Treatment Patient Details Name: Steve Richards MRN: [id number] DOB: 08-Jan-1966 Today's Date: 07/23/2019    History of Present Illness 54y.o. male admitted on 07/22/19 for SOB and LE edema.  Dx with acute systolic/diastolic CHF, volume overload, COVID (+), LE dopplers pending to r/o DVT due to elevated D dimer, liver cirrhosis, L LE cellulits .  Pt with significant PMH of  Pleural effusion, cirrhosis of the liver with ascites d/p thoracentesis.  Caridology consulted and also found severe aortic stenosis.  Bil LE dopplers negative for DVT.      PT Comments    Pt was able to progress to hallway gait.  O2 sats 87% or higher on RA during gait with 2/4 DOE.  Pt able to get sats back into the 90s with PLB and standing rest breaks.  He would continue to benefit from practice with a cane for gait stability. PT will continue to follow acutely for safe mobility progression.  Follow Up Recommendations  No PT follow up     Equipment Recommendations  Cane    Recommendations for Other Services   NA     Precautions / Restrictions Precautions Precautions: Fall Precaution Comments: weeping bil LE wounds left worse than R    Mobility  Bed Mobility               General bed mobility comments: Pt was OOB in the recliner chair.   Transfers Overall transfer level: Needs assistance Equipment used: None Transfers: Sit to/from Stand Sit to Stand: Supervision         General transfer comment: supervision for safety  Ambulation/Gait Ambulation/Gait assistance: Supervision Gait Distance (Feet): 300 Feet Assistive device: None Gait Pattern/deviations: Step-through pattern;Antalgic;Shuffle;Wide base of support Gait velocity: decreased Gait velocity interpretation: 1.31 - 2.62 ft/sec, indicative of limited community ambulator General Gait Details: hallway ambulation today with O2 sats 87% or higher on RA, DOE 2/4 and pt able to increase to 90% with PLB.  Pt's level surface gait  stability is good without obvious LOB, but cane would be benefical for outdoor/uneven surfaces and stairs.            Balance Overall balance assessment: Needs assistance Sitting-balance support: Feet supported;No upper extremity supported Sitting balance-Leahy Scale: Good     Standing balance support: No upper extremity supported Standing balance-Leahy Scale: Good                              Cognition Arousal/Alertness: Awake/alert Behavior During Therapy: WFL for tasks assessed/performed Overall Cognitive Status: Within Functional Limits for tasks assessed                                           General Comments General comments (skin integrity, edema, etc.): Encouraged pt to do mini squats, seated or standing marches and ankle pumps as well as laps around his room during the day.  Pt reports compliance with flutter valve and incentive spirometer.  He is concerned about his heart valve.       Pertinent Vitals/Pain Pain Assessment: No/denies pain           PT Goals (current goals can now be found in the care plan section) Acute Rehab PT Goals Patient Stated Goal: to get some fluid off and get his leg wounds headed in the right direction Progress towards PT goals:  Progressing toward goals    Frequency    Min 3X/week      PT Plan Current plan remains appropriate       AM-PAC PT "6 Clicks" Mobility   Outcome Measure  Help needed turning from your back to your side while in a flat bed without using bedrails?: A Little Help needed moving from lying on your back to sitting on the side of a flat bed without using bedrails?: A Little Help needed moving to and from a bed to a chair (including a wheelchair)?: None Help needed standing up from a chair using your arms (e.g., wheelchair or bedside chair)?: None Help needed to walk in hospital room?: None Help needed climbing 3-5 steps with a railing? : A Little 6 Click Score: 21     End of Session   Activity Tolerance: Patient limited by fatigue Patient left: in bed;Other (comment);with nursing/sitter in room(seated EOB) Nurse Communication: Mobility status PT Visit Diagnosis: Muscle weakness (generalized) (M62.81);Difficulty in walking, not elsewhere classified (R26.2);Pain Pain - Right/Left: Left Pain - part of body: Leg     Time: 1027-2536 PT Time Calculation (min) (ACUTE ONLY): 26 min  Charges:  $Gait Training: 23-37 mins                    Verdene Lennert, PT, DPT  Acute Rehabilitation [phone] pager #[phone] office     07/23/2019, 4:42 PM

## 2019-07-24 LAB — COMPREHENSIVE METABOLIC PANEL
ALT: 22 U/L (ref 0–44)
AST: 36 U/L (ref 15–41)
Albumin: 3.3 g/dL — ABNORMAL LOW (ref 3.5–5.0)
Alkaline Phosphatase: 284 U/L — ABNORMAL HIGH (ref 38–126)
Anion gap: 12 (ref 5–15)
BUN: 18 mg/dL (ref 6–20)
CO2: 34 mmol/L — ABNORMAL HIGH (ref 22–32)
Calcium: 9.1 mg/dL (ref 8.9–10.3)
Chloride: 93 mmol/L — ABNORMAL LOW (ref 98–111)
Creatinine, Ser: 0.95 mg/dL (ref 0.61–1.24)
GFR calc Af Amer: >60 mL/min (ref >60)
GFR calc non Af Amer: >60 mL/min (ref >60)
Glucose, Bld: 116 mg/dL — ABNORMAL HIGH (ref 70–99)
Potassium: 4.1 mmol/L (ref 3.5–5.1)
Sodium: 139 mmol/L (ref 135–145)
Total Bilirubin: 2.8 mg/dL — ABNORMAL HIGH (ref 0.3–1.2)
Total Protein: 7.3 g/dL (ref 6.5–8.1)

## 2019-07-24 LAB — MAGNESIUM: Magnesium: 2 mg/dL (ref 1.7–2.4)

## 2019-07-24 LAB — CBC WITH DIFFERENTIAL/PLATELET
Abs Immature Granulocytes: 0.11 10*3/uL — ABNORMAL HIGH (ref 0.00–0.07)
Basophils Absolute: 0 10*3/uL (ref 0.0–0.1)
Basophils Relative: 1 %
Eosinophils Absolute: 0.1 10*3/uL (ref 0.0–0.5)
Eosinophils Relative: 1 %
HCT: 45.8 % (ref 39.0–52.0)
Hemoglobin: 14.6 g/dL (ref 13.0–17.0)
Immature Granulocytes: 1 %
Lymphocytes Relative: 11 %
Lymphs Abs: 0.9 10*3/uL (ref 0.7–4.0)
MCH: 29.6 pg (ref 26.0–34.0)
MCHC: 31.9 g/dL (ref 30.0–36.0)
MCV: 92.9 fL (ref 80.0–100.0)
Monocytes Absolute: 0.5 10*3/uL (ref 0.1–1.0)
Monocytes Relative: 6 %
Neutro Abs: 6.3 10*3/uL (ref 1.7–7.7)
Neutrophils Relative %: 80 %
Platelets: 215 10*3/uL (ref 150–400)
RBC: 4.93 MIL/uL (ref 4.22–5.81)
RDW: 16.5 % — ABNORMAL HIGH (ref 11.5–15.5)
WBC: 7.9 10*3/uL (ref 4.0–10.5)
nRBC: 0 % (ref 0.0–0.2)

## 2019-07-24 LAB — GLUCOSE, CAPILLARY
Glucose-Capillary: 126 mg/dL — ABNORMAL HIGH (ref 70–99)
Glucose-Capillary: 114 mg/dL — ABNORMAL HIGH (ref 70–99)
Glucose-Capillary: 175 mg/dL — ABNORMAL HIGH (ref 70–99)
Glucose-Capillary: 132 mg/dL — ABNORMAL HIGH (ref 70–99)

## 2019-07-24 LAB — D-DIMER, QUANTITATIVE: D-Dimer, Quant: 3.59 ug/mL-FEU — ABNORMAL HIGH (ref 0.00–0.50)

## 2019-07-24 LAB — C-REACTIVE PROTEIN: CRP: 4.9 mg/dL — ABNORMAL HIGH (ref <1.0)

## 2019-07-24 MED ORDER — ENOXAPARIN SODIUM 80 MG/0.8ML ~~LOC~~ SOLN
65.0000 mg | SUBCUTANEOUS | Status: DC
  Administered 2019-07-25 – 2019-07-27 (×3): 65 mg via SUBCUTANEOUS
  Filled 2019-07-24 (×3): qty 0.8

## 2019-07-24 NOTE — Progress Notes (Signed)
Per Rasheen, RN, pt will have CT angio in AM. Requested he have night shift RN enter IV Team consult for IV placement in AM to preserve pt's veins and ensure IV is patent for exam. Discussed with pt. He voiced understanding of plan.

## 2019-07-24 NOTE — Progress Notes (Signed)
Progress Note  Due to the COVID-19 pandemic, this visit was completed with telemedicine (audio/video) technology to reduce patient and provider exposure as well as to preserve personal protective equipment.   Patient Name: Steve Richards Date of Encounter: 07/24/2019  Primary Cardiologist: Candee Furbish, MD   Subjective   Feels better with diuresis.  Watching blood pressure closely.  Has been soft at times.  We stopped spironolactone yesterday.  He states that at home his blood pressure usually is approximately 110/80.  Inpatient Medications    Scheduled Meds: . aspirin EC  81 mg Oral Daily  . docusate sodium  100 mg Oral BID  . enoxaparin (LOVENOX) injection  70 mg Subcutaneous Q24H  . feeding supplement (ENSURE ENLIVE)  237 mL Oral Q1500  . feeding supplement (PRO-STAT SUGAR FREE 64)  30 mL Oral Daily  . furosemide  40 mg Intravenous Q12H  . insulin aspart  0-15 Units Subcutaneous TID WC  . levothyroxine  25 mcg Oral Q0600  . sodium chloride flush  3 mL Intravenous Q12H   Continuous Infusions: . sodium chloride    .  ceFAZolin (ANCEF) IV Stopped (07/24/19 0533)   PRN Meds: sodium chloride, acetaminophen **OR** acetaminophen, bisacodyl, hydrALAZINE, HYDROcodone-acetaminophen, ondansetron **OR** ondansetron (ZOFRAN) IV, polyethylene glycol, sodium chloride flush   Vital Signs    Vitals:   07/23/19 2056 07/24/19 0505 07/24/19 0748 07/24/19 1020  BP: 90/74 105/75 100/76 93/67  Pulse: 83 76 74 81  Resp: 19 18 19 18   Temp: 97.6 F (36.4 C) 97.7 F (36.5 C) 98 F (36.7 C) 98 F (36.7 C)  TempSrc: Oral Oral Oral Oral  SpO2: 96% 93% 92% 93%  Weight:  129.1 kg    Height:        Intake/Output Summary (Last 24 hours) at 07/24/2019 1142 Last data filed at 07/24/2019 0600 Gross per 24 hour  Intake 1256 ml  Output 1850 ml  Net -594 ml   Last 3 Weights 07/24/2019 07/23/2019 07/23/2019  Weight (lbs) 284 lb 9.8 oz 287 lb 12.8 oz 283 lb 4.7 oz  Weight (kg) 129.1 kg 130.545 kg  128.5 kg     Physical Exam   VITAL SIGNS:  reviewed  Able to complete full sentences without difficulty.  Pleasant.  Labs    Chemistry Recent Labs  Lab 07/21/19 0820 07/22/19 0823 07/23/19 0622  NA 136 139 138  K 4.2 3.7 3.9  CL 98 99 96*  CO2 26 30 33*  GLUCOSE 153* 102* 112*  BUN 14 13 16   CREATININE 0.93 0.88 0.96  CALCIUM 8.6* 8.6* 8.6*  PROT  --  6.7 6.3*  ALBUMIN  --  3.2* 2.9*  AST  --  34 27  ALT  --  29 22  ALKPHOS  --  264* 233*  BILITOT  --  3.0* 2.6*  GFRNONAA >60 >60 >60  GFRAA >60 >60 >60  ANIONGAP 12 10 9      Hematology Recent Labs  Lab 07/21/19 0820 07/22/19 0823 07/23/19 0622  WBC 8.9 7.8 7.3  RBC 4.88 5.00 4.54  HGB 14.5 15.2 13.6  HCT 46.0 46.9 42.5  MCV 94.3 93.8 93.6  MCH 29.7 30.4 30.0  MCHC 31.5 32.4 32.0  RDW 16.5* 16.5* 16.4*  PLT 225 222 197    Cardiac EnzymesNo results for input(s): TROPONINI in the last 168 hours. No results for input(s): TROPIPOC in the last 168 hours.   BNP Recent Labs  Lab 07/21/19 1316  BNP 1,142.6*  DDimer  Recent Labs  Lab 07/21/19 1921 07/22/19 0823 07/23/19 0622  DDIMER 6.01* 5.23* 3.88*     Radiology    VAS [place] LOWER EXTREMITY VENOUS (DVT)  Result Date: 07/23/2019  Lower Venous DVTStudy Indications: Edema, and elevated ddimer.  Limitations: Body habitus and poor ultrasound/tissue interface. Comparison Study: no prior Performing Technologist: Abram Sander RVS  Examination Guidelines: A complete evaluation includes B-mode imaging, spectral Doppler, color Doppler, and power Doppler as needed of all accessible portions of each vessel. Bilateral testing is considered an integral part of a complete examination. Limited examinations for reoccurring indications may be performed as noted. The reflux portion of the exam is performed with the patient in reverse Trendelenburg.  +---------+---------------+---------+-----------+----------+--------------+ RIGHT     CompressibilityPhasicitySpontaneityPropertiesThrombus Aging +---------+---------------+---------+-----------+----------+--------------+ CFV      Full           Yes      Yes                                 +---------+---------------+---------+-----------+----------+--------------+ SFJ      Full                                                        +---------+---------------+---------+-----------+----------+--------------+ FV Prox  Full                                                        +---------+---------------+---------+-----------+----------+--------------+ FV Mid                  Yes      Yes                                 +---------+---------------+---------+-----------+----------+--------------+ FV Distal               Yes      Yes                                 +---------+---------------+---------+-----------+----------+--------------+ PFV      Full                                                        +---------+---------------+---------+-----------+----------+--------------+ POP      Full           Yes      Yes                                 +---------+---------------+---------+-----------+----------+--------------+ PTV      Full                                                        +---------+---------------+---------+-----------+----------+--------------+  PERO     Full                                                        +---------+---------------+---------+-----------+----------+--------------+   +---------+---------------+---------+-----------+----------+--------------+ LEFT     CompressibilityPhasicitySpontaneityPropertiesThrombus Aging +---------+---------------+---------+-----------+----------+--------------+ CFV      Full           Yes      Yes                                 +---------+---------------+---------+-----------+----------+--------------+ SFJ      Full                                                         +---------+---------------+---------+-----------+----------+--------------+ FV Prox  Full                                                        +---------+---------------+---------+-----------+----------+--------------+ FV Mid   Full                                                        +---------+---------------+---------+-----------+----------+--------------+ FV Distal               Yes      Yes                                 +---------+---------------+---------+-----------+----------+--------------+ PFV      Full                                                        +---------+---------------+---------+-----------+----------+--------------+ POP      Full           Yes      Yes                                 +---------+---------------+---------+-----------+----------+--------------+ PTV      Full                                                        +---------+---------------+---------+-----------+----------+--------------+ PERO  Not visualized +---------+---------------+---------+-----------+----------+--------------+     Summary: BILATERAL: - No evidence of deep vein thrombosis seen in the lower extremities, bilaterally. -   *See table(s) above for measurements and observations. Electronically signed by Monica Martinez MD on 07/23/2019 at 5:31:24 PM.    Final     Cardiac Studies   Aortic stenosis gradient 90 to 100 mmHg  Patient Profile     54 y.o. male with newly discovered severe critical aortic stenosis symptomatic with acute diastolic heart failure and morbid obesity.   Assessment & Plan    Severe symptomatic aortic stenosis -Peak velocity 6 m/s, mean gradient 90-100 -Continue with diuresis, net out 8 L.  He is feeling better.  Less edema in lower extremities but still present. -Timing of further work-up will be discussed further with structural heart team, Dr. Burt Knack to  see.  Patient understands and is certainly willing to work with [place].  Acute diastolic heart failure -We stopped spironolactone yesterday to allow for more blood pressure. -Continue with IV Lasix.  Hold for systolic blood pressure less than 95.  Morbid obesity -BMI 38 continue to encourage weight loss.  His last 30 to 40 pounds were likely fluid.     For questions or updates, please contact [place] Please consult [email or web address] for contact info under        Signed, Candee Furbish, MD  07/24/2019, 11:42 AM

## 2019-07-24 NOTE — Progress Notes (Signed)
PROGRESS NOTE                                                                                                                                                                                                             Patient Demographics:    Steve Richards, is a 54 y.o. male, DOB - 1965-06-03, WYO:[id number]  Admit date - 07/21/2019   Admitting Physician Karmen Bongo, MD  Outpatient Primary MD for the patient is System, Pcp Not In  LOS - 3   Chief Complaint  Patient presents with  . Shortness of Breath  . Leg Swelling       Brief Narrative     Steve Richards is a 53 y.o. male with medical history significant of R pleural effusion in April 2021 presenting with SOB and LE edema.    Patient did not follow with PCP for last 6 years, patient with recent work-up as an outpatient and negative for CHF, volume overload with thoracentesis status post drain, as well evidence of liver cirrhosis, and recent diagnosis of left lower extremity cellulitis where he was started on Keflex, ED secondary to dyspnea, evidence of volume overload, as well he tested Covid positive on admission .   Subjective:    Steve Richards today has, No headache, No chest pain, No abdominal pain, ports dyspnea significantly improved, but still present with activity .   Assessment  & Plan :    Principal Problem:   New onset of congestive heart failure (HCC) Active Problems:   Hepatic cirrhosis (HCC)   Pleural effusion on right   Systolic ejection murmur   Cellulitis and abscess of left lower extremity   Hyperglycemia   Obesity, Class III, BMI 40-49.9 (morbid obesity) (HCC)   Acute systolic/diastolic CHF -Did not follow with his PCP for last 6 years, with poor outpatient follow-up. -Evidence of volume overload on imaging, recurrent pleural effusion, elevated BNP and lower extremity edema. -Continue with IV diuresis, he is currently on 40 mg IV twice daily, breathing well, so far -8  L since admission, but still appears to be massively volume overloaded, will continue with current dose with no increasing given soft blood pressure . -2D echo significant for low EF 40 to 45%, with grade 2 diastolic CHF, cardiology has been consulted regarding further recommendations. -Blood pressure is persistently on the lower side,  there is no room to add any beta-blockers or ACEi/ARB  Severe aortic stenosis -Management per cardiology . -likely contributing to multiple comorbidities, possibly causing cardiac cirrhosis, his lower extremity edema contributing to venous stasis and cellulitis   COVID-19 infection -Patient denies any fever, cough currently, no evidence of pneumonia on imaging, he has no hypoxia, so for now no indication for steroids and remdesivir, but will continue to monitor closely especially with significantly elevated inflammatory markers. -D-dimers elevated at 6 on admission, it is contrary it is currently trending down, tinea with current DVT prophylaxis dose, his venous Doppler negative for DVT .  COVID-19 Labs  Recent Labs    07/21/19 1921 07/22/19 0823 07/23/19 0622  DDIMER 6.01* 5.23* 3.88*  FERRITIN 235 207 203  LDH 317*  --   --   CRP 9.8* 8.0* 6.0*    Lab Results  Component Value Date   SARSCOV2NAA POSITIVE (A) 07/21/2019   [place] NEGATIVE 06/23/2019   Liver cirrhosis -Patient denies any history of alcohol abuse in the past, this is most likely related to cardiac cirrhosis versus NASH. -Right upper quadrant ultrasound. -We will DC Aldactone given soft blood pressure  Left lower extremity cellulitis -He appears to be having left lower extremity cellulitis, which did not respond to Keflex, as well there is evidence of venous stasis disease contributing to his infection, as well significant lower extremity edema as well. -Initially on IV vancomycin, will change to IV cefazolin  Hyperglycemia -A1c is 6.1  Morbid obesity  Hypothyroidism -TSH  elevated at 14, with low normal free T4 at 0.65, started on low-dose Synthroid   Code Status : Full  Disposition Plan  :  Status is: Inpatient  Remains inpatient appropriate because:IV treatments appropriate due to intensity of illness or inability to take PO   Dispo: The patient is from: Home              Anticipated d/c is to: Home              Anticipated d/c date is: 3 days              Patient currently is not medically stable to d/c.  Patient still with significant volume overload, requiring further IV diuresis.       Consults  :  Cardiology  Procedures  : None  DVT Prophylaxis  :  [place] lovenox  Lab Results  Component Value Date   PLT 197 07/23/2019    Antibiotics  :   Anti-infectives (From admission, onward)   Start     Dose/Rate Route Frequency Ordered Stop   07/22/19 1400  ceFAZolin (ANCEF) IVPB 2g/100 mL premix     2 g 200 mL/hr over 30 Minutes Intravenous Every 8 hours 07/22/19 1223     07/22/19 0000  vancomycin (VANCOREADY) IVPB 1250 mg/250 mL  Status:  Discontinued     1,250 mg 166.7 mL/hr over 90 Minutes Intravenous Every 12 hours 07/21/19 1141 07/22/19 1223   07/21/19 1145  vancomycin (VANCOREADY) IVPB 2000 mg/400 mL     2,000 mg 200 mL/hr over 120 Minutes Intravenous  Once 07/21/19 1141 07/21/19 1509        Objective:   Vitals:   07/23/19 2056 07/24/19 0505 07/24/19 0748 07/24/19 1020  BP: 90/74 105/75 100/76 93/67  Pulse: 83 76 74 81  Resp: 19 18 19 18   Temp: 97.6 F (36.4 C) 97.7 F (36.5 C) 98 F (36.7 C) 98 F (36.7 C)  TempSrc: Oral Oral Oral  Oral  SpO2: 96% 93% 92% 93%  Weight:  129.1 kg    Height:        Wt Readings from Last 3 Encounters:  07/24/19 129.1 kg  09/24/11 125.2 kg     Intake/Output Summary (Last 24 hours) at 07/24/2019 1030 Last data filed at 07/24/2019 0600 Gross per 24 hour  Intake 1256 ml  Output 2450 ml  Net -1194 ml     Physical Exam  Awake Alert, Oriented X 3, No new F.N deficits, Normal  affect Symmetrical Chest wall movement, Good air movement bilaterally, CTAB RRR,No Gallops,Rubs ,+ Murmurs, No Parasternal Heave +ve B.Sounds, Abd Soft, No tenderness, No rebound - guarding or rigidity. No Cyanosis, lateral lower extremity edema +2, with left lower extremity superficial wounds with sanguinous discharge, and some significant redness, but it is much less warm today       Data Review:    CBC Recent Labs  Lab 07/21/19 0820 07/22/19 0823 07/23/19 0622  WBC 8.9 7.8 7.3  HGB 14.5 15.2 13.6  HCT 46.0 46.9 42.5  PLT 225 222 197  MCV 94.3 93.8 93.6  MCH 29.7 30.4 30.0  MCHC 31.5 32.4 32.0  RDW 16.5* 16.5* 16.4*  LYMPHSABS  --  0.9 0.9  MONOABS  --  0.6 0.6  EOSABS  --  0.1 0.1  BASOSABS  --  0.0 0.1    Chemistries  Recent Labs  Lab 07/21/19 0820 07/22/19 0823 07/23/19 0622  NA 136 139 138  K 4.2 3.7 3.9  CL 98 99 96*  CO2 26 30 33*  GLUCOSE 153* 102* 112*  BUN 14 13 16   CREATININE 0.93 0.88 0.96  CALCIUM 8.6* 8.6* 8.6*  MG  --  1.8 1.8  AST  --  34 27  ALT  --  29 22  ALKPHOS  --  264* 233*  BILITOT  --  3.0* 2.6*   ------------------------------------------------------------------------------------------------------------------ Recent Labs    07/21/19 1515  CHOL 128  HDL 37*  LDLCALC 74  TRIG 87  CHOLHDL 3.5    Lab Results  Component Value Date   HGBA1C 6.1 (H) 07/21/2019   ------------------------------------------------------------------------------------------------------------------ Recent Labs    07/21/19 1515  TSH 14.146*   ------------------------------------------------------------------------------------------------------------------ Recent Labs    07/22/19 0823 07/23/19 0622  FERRITIN 207 203    Coagulation profile No results for input(s): INR, PROTIME in the last 168 hours.  Recent Labs    07/22/19 0823 07/23/19 0622  DDIMER 5.23* 3.88*    Cardiac Enzymes No results for input(s): CKMB, TROPONINI, MYOGLOBIN  in the last 168 hours.  Invalid input(s): CK ------------------------------------------------------------------------------------------------------------------    Component Value Date/Time   BNP 1,142.6 (H) 07/21/2019 1316    Inpatient Medications  Scheduled Meds: . aspirin EC  81 mg Oral Daily  . docusate sodium  100 mg Oral BID  . enoxaparin (LOVENOX) injection  70 mg Subcutaneous Q24H  . feeding supplement (ENSURE ENLIVE)  237 mL Oral Q1500  . feeding supplement (PRO-STAT SUGAR FREE 64)  30 mL Oral Daily  . furosemide  40 mg Intravenous Q12H  . insulin aspart  0-15 Units Subcutaneous TID WC  . levothyroxine  25 mcg Oral Q0600  . sodium chloride flush  3 mL Intravenous Q12H   Continuous Infusions: . sodium chloride    .  ceFAZolin (ANCEF) IV Stopped (07/24/19 0533)   PRN Meds:.sodium chloride, acetaminophen **OR** acetaminophen, bisacodyl, hydrALAZINE, HYDROcodone-acetaminophen, ondansetron **OR** ondansetron (ZOFRAN) IV, polyethylene glycol, sodium chloride flush  Micro Results Recent Results (from  the past 240 hour(s))  Culture, blood (routine x 2)     Status: None (Preliminary result)   Collection Time: 07/21/19 12:37 PM   Specimen: BLOOD RIGHT HAND  Result Value Ref Range Status   Specimen Description BLOOD RIGHT HAND  Final   Special Requests   Final    BOTTLES DRAWN AEROBIC ONLY Blood Culture results may not be optimal due to an inadequate volume of blood received in culture bottles   Culture   Final    NO GROWTH 3 DAYS Performed at [facility], [place] [address] [place], [place] [address]    Report Status PENDING  Incomplete  SARS Coronavirus 2 by RT PCR (hospital order, performed in [facility] hospital lab) Nasopharyngeal Nasopharyngeal Swab     Status: Abnormal   Collection Time: 07/21/19 12:37 PM   Specimen: Nasopharyngeal Swab  Result Value Ref Range Status   SARS Coronavirus 2 POSITIVE (A) NEGATIVE Final    Comment: RESULT CALLED TO, READ BACK BY AND  VERIFIED WITH: Angelina Ok RN 16:45 07/21/19 ([email or web address]) (NOTE) SARS-CoV-2 target nucleic acids are DETECTED SARS-CoV-2 RNA is generally detectable in upper respiratory specimens  during the acute phase of infection.  Positive results are indicative  of the presence of the identified virus, but do not rule out bacterial infection or co-infection with other pathogens not detected by the test.  Clinical correlation with patient history and  other diagnostic information is necessary to determine patient infection status.  The expected result is negative. Fact Sheet for Patients:   StrictlyIdeas.no  Fact Sheet for Healthcare Providers:   BankingDealers.co.za   This test is not yet approved or cleared by the [place] FDA and  has been authorized for detection and/or diagnosis of SARS-CoV-2 by FDA under an Emergency Use Authorization (EUA).  This EUA will remain in effect (meaning this  test can be used) for the duration of  the COVID-19 declaration under Section 564(b)(1) of the Act, 21 U.S.C. section 360-bbb-3(b)(1), unless the authorization is terminated or revoked sooner. Performed at [facility], [place] [address] [place], [place] [address]   Culture, blood (routine x 2)     Status: None (Preliminary result)   Collection Time: 07/21/19 12:38 PM   Specimen: BLOOD  Result Value Ref Range Status   Specimen Description BLOOD RIGHT ANTECUBITAL  Final   Special Requests   Final    BOTTLES DRAWN AEROBIC AND ANAEROBIC Blood Culture results may not be optimal due to an inadequate volume of blood received in culture bottles   Culture   Final    NO GROWTH 3 DAYS Performed at [facility], [place] [address], [place], [place] [address]    Report Status PENDING  Incomplete    Radiology Reports DG Chest 1 View  Result Date: 06/25/2019 CLINICAL DATA:  Status post right thoracentesis today. EXAM: CHEST  1 VIEW COMPARISON:  None.  FINDINGS: No pneumothorax is seen after thoracentesis. Only a small residual pleural effusion is identified. The patient has a small left pleural effusion. There is cardiomegaly without edema. IMPRESSION: Negative for pneumothorax after thoracentesis. Small bilateral pleural effusions. Cardiomegaly. Electronically Signed   By: Inge Rise M.D.   On: 06/25/2019 15:22   DG Chest 2 View  Result Date: 07/21/2019 CLINICAL DATA:  Chest pain and shortness of breath EXAM: CHEST - 2 VIEW COMPARISON:  06/25/2018 FINDINGS: Congested appearance of vessels with small pleural effusions. Cardiomegaly that is chronic. Negative aortic contours. IMPRESSION: Cardiomegaly  with vascular congestion and small pleural effusions. Electronically Signed   By: Monte Fantasia M.D.   On: 07/21/2019 08:42   ECHOCARDIOGRAM COMPLETE  Result Date: 07/21/2019    ECHOCARDIOGRAM REPORT   Patient Name:   HARLEM BULA Date of Exam: 07/21/2019 Medical Rec #:  [id number]   Height:       72.0 in Accession #:    [id number]  Weight:       310.0 lb Date of Birth:  [birth year]   BSA:          2.567 m Patient Age:    65 years    BP:           102/73 mmHg Patient Gender: M           HR:           73 bpm. Exam Location:  Inpatient Procedure: 2D Echo Indications:    CHF-Acute Systolic 389.37 / D42.87  History:        Patient has no prior history of Echocardiogram examinations.                 Risk Factors:Obesity. Pleural effusion on right.  Sonographer:    Vikki Ports Turrentine Referring Phys: [place]  1. Left ventricular ejection fraction, by estimation, is 40 to 45%. The left ventricle has mildly decreased function. The left ventricle demonstrates global hypokinesis. There is moderate left ventricular hypertrophy. Left ventricular diastolic parameters are consistent with Grade II diastolic dysfunction (pseudonormalization).  2. Right ventricular systolic function is mildly reduced. The right ventricular size is normal. There is  moderately elevated pulmonary artery systolic pressure.  3. Left atrial size was moderately dilated.  4. The mitral valve is abnormal. Trivial mitral valve regurgitation.  5. Cannot exclude a bicuspid valve. Aortic valve regurgitation is mild. Critical aortic valve stenosis. Aortic valve area, by VTI measures 0.73 cm. Aortic valve mean gradient measures 96.2 mmHg. Peak gradient of 137 mmHg. Aortic valve Vmax measures 5.86 m/s.  6. The inferior vena cava is dilated in size with <50% respiratory variability, suggesting right atrial pressure of 15 mmHg.  7. Moderate pleural effusion in the right lateral region. FINDINGS  Left Ventricle: Left ventricular ejection fraction, by estimation, is 40 to 45%. The left ventricle has mildly decreased function. The left ventricle demonstrates global hypokinesis. The left ventricular internal cavity size was normal in size. There is  moderate left ventricular hypertrophy. Left ventricular diastolic parameters are consistent with Grade II diastolic dysfunction (pseudonormalization). Indeterminate filling pressures. Right Ventricle: The right ventricular size is normal. No increase in right ventricular wall thickness. Right ventricular systolic function is mildly reduced. There is moderately elevated pulmonary artery systolic pressure. The tricuspid regurgitant velocity is 3.35 m/s, and with an assumed right atrial pressure of 15 mmHg, the estimated right ventricular systolic pressure is 68.1 mmHg. Left Atrium: Left atrial size was moderately dilated. Right Atrium: Right atrial size was normal in size. Pericardium: There is no evidence of pericardial effusion. Mitral Valve: The mitral valve is abnormal. Mild to moderate mitral annular calcification. Trivial mitral valve regurgitation. Tricuspid Valve: The tricuspid valve is grossly normal. Tricuspid valve regurgitation is mild. Aortic Valve: The aortic valve is tricuspid. . There is moderate thickening and severe calcifcation of the  aortic valve. Aortic valve regurgitation is mild. Aortic regurgitation PHT measures 374 msec. Severe aortic stenosis is present. There is moderate thickening of the aortic valve. There is severe calcifcation of the aortic valve. Aortic valve mean gradient measures  96.2 mmHg. Aortic valve peak gradient measures 137.3 mmHg. Aortic valve area, by VTI measures 0.73 cm. Pulmonic Valve: The pulmonic valve was normal in structure. Pulmonic valve regurgitation is not visualized. Aorta: The aortic root and ascending aorta are structurally normal, with no evidence of dilitation. Venous: The inferior vena cava is dilated in size with less than 50% respiratory variability, suggesting right atrial pressure of 15 mmHg. IAS/Shunts: No atrial level shunt detected by color flow Doppler. Additional Comments: There is a moderate pleural effusion in the right lateral region.  LEFT VENTRICLE PLAX 2D LVIDd:         5.50 cm  Diastology LVIDs:         4.40 cm  LV e' lateral:   9.23 cm/s LV PW:         1.40 cm  LV E/e' lateral: 10.2 LV IVS:        1.40 cm  LV e' medial:    7.05 cm/s LVOT diam:     2.50 cm  LV E/e' medial:  13.4 LV SV:         108 LV SV Index:   42 LVOT Area:     4.91 cm  RIGHT VENTRICLE RV S prime:     7.20 cm/s TAPSE (M-mode): 1.2 cm LEFT ATRIUM              Index       RIGHT ATRIUM           Index LA diam:        5.90 cm  2.30 cm/m  RA Area:     22.60 cm LA Vol (A2C):   121.0 ml 47.14 ml/m RA Volume:   68.80 ml  26.80 ml/m LA Vol (A4C):   95.5 ml  37.21 ml/m LA Biplane Vol: 110.0 ml 42.86 ml/m  AORTIC VALVE AV Area (Vmax):    0.82 cm AV Area (Vmean):   0.72 cm AV Area (VTI):     0.73 cm AV Vmax:           585.80 cm/s AV Vmean:          474.000 cm/s AV VTI:            1.480 m AV Peak Grad:      137.3 mmHg AV Mean Grad:      96.2 mmHg LVOT Vmax:         97.30 cm/s LVOT Vmean:        69.800 cm/s LVOT VTI:          0.220 m LVOT/AV VTI ratio: 0.15 AI PHT:            374 msec  AORTA Ao Root diam: 3.00 cm MITRAL VALVE                TRICUSPID VALVE MV Area (PHT): 5.54 cm    TR Peak grad:   44.9 mmHg MV Decel Time: 137 msec    TR Vmax:        335.00 cm/s MV E velocity: 94.30 cm/s MV A velocity: 46.30 cm/s  SHUNTS MV E/A ratio:  2.04        Systemic VTI:  0.22 m                            Systemic Diam: 2.50 cm Lyman Bishop MD Electronically signed by Lyman Bishop MD Signature Date/Time: 07/21/2019/3:42:23 PM    Final    VAS [place]  LOWER EXTREMITY VENOUS (DVT)  Result Date: 07/23/2019  Lower Venous DVTStudy Indications: Edema, and elevated ddimer.  Limitations: Body habitus and poor ultrasound/tissue interface. Comparison Study: no prior Performing Technologist: Abram Sander RVS  Examination Guidelines: A complete evaluation includes B-mode imaging, spectral Doppler, color Doppler, and power Doppler as needed of all accessible portions of each vessel. Bilateral testing is considered an integral part of a complete examination. Limited examinations for reoccurring indications may be performed as noted. The reflux portion of the exam is performed with the patient in reverse Trendelenburg.  +---------+---------------+---------+-----------+----------+--------------+ RIGHT    CompressibilityPhasicitySpontaneityPropertiesThrombus Aging +---------+---------------+---------+-----------+----------+--------------+ CFV      Full           Yes      Yes                                 +---------+---------------+---------+-----------+----------+--------------+ SFJ      Full                                                        +---------+---------------+---------+-----------+----------+--------------+ FV Prox  Full                                                        +---------+---------------+---------+-----------+----------+--------------+ FV Mid                  Yes      Yes                                 +---------+---------------+---------+-----------+----------+--------------+ FV Distal                Yes      Yes                                 +---------+---------------+---------+-----------+----------+--------------+ PFV      Full                                                        +---------+---------------+---------+-----------+----------+--------------+ POP      Full           Yes      Yes                                 +---------+---------------+---------+-----------+----------+--------------+ PTV      Full                                                        +---------+---------------+---------+-----------+----------+--------------+ PERO     Full                                                        +---------+---------------+---------+-----------+----------+--------------+   +---------+---------------+---------+-----------+----------+--------------+  LEFT     CompressibilityPhasicitySpontaneityPropertiesThrombus Aging +---------+---------------+---------+-----------+----------+--------------+ CFV      Full           Yes      Yes                                 +---------+---------------+---------+-----------+----------+--------------+ SFJ      Full                                                        +---------+---------------+---------+-----------+----------+--------------+ FV Prox  Full                                                        +---------+---------------+---------+-----------+----------+--------------+ FV Mid   Full                                                        +---------+---------------+---------+-----------+----------+--------------+ FV Distal               Yes      Yes                                 +---------+---------------+---------+-----------+----------+--------------+ PFV      Full                                                        +---------+---------------+---------+-----------+----------+--------------+ POP      Full           Yes      Yes                                  +---------+---------------+---------+-----------+----------+--------------+ PTV      Full                                                        +---------+---------------+---------+-----------+----------+--------------+ PERO                                                  Not visualized +---------+---------------+---------+-----------+----------+--------------+     Summary: BILATERAL: - No evidence of deep vein thrombosis seen in the lower extremities, bilaterally. -   *See table(s) above for measurements and observations. Electronically signed by Monica Martinez MD on 07/23/2019 at 5:31:24 PM.    Final    IR THORACENTESIS ASP PLEURAL SPACE W/IMG GUIDE  Result Date: 06/25/2019  INDICATION: Patient with history of bilateral pleural effusions, right greater than left. Request made for diagnostic and therapeutic right thoracentesis EXAM: ULTRASOUND GUIDED DIAGNOSTIC AND THERAPEUTIC RIGHT THORACENTESIS MEDICATIONS: None COMPLICATIONS: None immediate. PROCEDURE: An ultrasound guided thoracentesis was thoroughly discussed with the patient and questions answered. The benefits, risks, alternatives and complications were also discussed. The patient understands and wishes to proceed with the procedure. Written consent was obtained. Ultrasound was performed to localize and mark an adequate pocket of fluid in the right chest. The area was then prepped and draped in the normal sterile fashion. 1% Lidocaine was used for local anesthesia. Under ultrasound guidance a 6 Fr Safe-T-Centesis catheter was introduced. Thoracentesis was performed. The catheter was removed and a dressing applied. FINDINGS: A total of approximately 1.5 liters of yellow fluid was removed. Samples were sent to the laboratory as requested by the clinical team. IMPRESSION: Successful ultrasound guided diagnostic and therapeutic right thoracentesis yielding 1.5 liters of pleural fluid. Read by: Rowe Robert, PA-C Electronically Signed    By: Corrie Mckusick D.O.   On: 06/25/2019 15:38    Phillips Climes M.D on 07/24/2019 at 10:30 AM  Between 7am to 7pm - Pager - [phone]  After 7pm go to [email or web address] - password [facility]  [facility] -  Office  [phone]

## 2019-07-24 NOTE — Progress Notes (Signed)
Physical Therapy Treatment Patient Details Name: Steve Richards MRN: [id number] DOB: 1965-12-24 Today's Date: 07/24/2019    History of Present Illness 54y.o. male admitted on 07/22/19 for SOB and LE edema.  Dx with acute systolic/diastolic CHF, volume overload, COVID (+), LE dopplers pending to r/o DVT due to elevated D dimer, liver cirrhosis, L LE cellulits .  Pt with significant PMH of  Pleural effusion, cirrhosis of the liver with ascites d/p thoracentesis.  Caridology consulted and also found severe aortic stenosis.  Bil LE dopplers negative for DVT.      PT Comments    Pt was able to walk two laps around the unit with supervision today.  He continues to stagger at times, but self corrects.  O2 sats range from 88-100% during gait with 2/4 DOE. He has difficulty walking, talking and breathing and had to take 2 standing rest breaks to catch his breath during gait.  He is close to meeting his therapy goals.  I plan to have him try the cane and step ups on our one step next session to test his ability to do stairs for home access and his strength/endurance.  PT will continue to follow acutely for safe mobility progression   Follow Up Recommendations  No PT follow up     Equipment Recommendations  Cane    Recommendations for Other Services   NA     Precautions / Restrictions Precautions Precautions: Fall Precaution Comments: weeping bil LE wounds left worse than R    Mobility  Bed Mobility               General bed mobility comments: Pt was OOB in the recliner chair.   Transfers Overall transfer level: Needs assistance Equipment used: None Transfers: Sit to/from Stand Sit to Stand: Supervision         General transfer comment: supervision for safety  Ambulation/Gait Ambulation/Gait assistance: Supervision Gait Distance (Feet): 300 Feet Assistive device: None Gait Pattern/deviations: Step-through pattern;Staggering left;Staggering right Gait velocity: decreased Gait  velocity interpretation: 1.31 - 2.62 ft/sec, indicative of limited community ambulator General Gait Details: Gait pattern is improving, stiff legged when he first gets up, but better with distance, still staggers on occasions, but self corrects.  Pt with difficulty walking and talking.  O2 sats 88-100% during gait via pedi probe finger pulse ox.  Pt's goal is to build his strength and endurance.            Balance Overall balance assessment: Needs assistance Sitting-balance support: Feet supported;Bilateral upper extremity supported Sitting balance-Leahy Scale: Good     Standing balance support: No upper extremity supported Standing balance-Leahy Scale: Good Standing balance comment: for pulling up his sock, pt needed support from the hallway railing, but static balance no problems, dynamic close supervision.                             Cognition Arousal/Alertness: Awake/alert Behavior During Therapy: WFL for tasks assessed/performed Overall Cognitive Status: Within Functional Limits for tasks assessed                                        Exercises Other Exercises Other Exercises: Pt reports compliance with hourly IS and FV, max volume 1000 mL, continued to encourage independent laps around the room and LE exercises/stretches throughout the day.  Pertinent Vitals/Pain Pain Assessment: Faces Faces Pain Scale: Hurts a little bit Pain Location: Left LE Pain Descriptors / Indicators: Aching;Burning Pain Intervention(s): Limited activity within patient's tolerance;Monitored during session;Repositioned           PT Goals (current goals can now be found in the care plan section) Acute Rehab PT Goals Patient Stated Goal: to get some fluid off and get his leg wounds headed in the right direction Progress towards PT goals: Progressing toward goals    Frequency    Min 3X/week      PT Plan Current plan remains appropriate        AM-PAC PT "6 Clicks" Mobility   Outcome Measure  Help needed turning from your back to your side while in a flat bed without using bedrails?: A Little Help needed moving from lying on your back to sitting on the side of a flat bed without using bedrails?: A Little Help needed moving to and from a bed to a chair (including a wheelchair)?: None Help needed standing up from a chair using your arms (e.g., wheelchair or bedside chair)?: None Help needed to walk in hospital room?: None Help needed climbing 3-5 steps with a railing? : A Little 6 Click Score: 21    End of Session   Activity Tolerance: Patient tolerated treatment well Patient left: in chair;with call bell/phone within reach Nurse Communication: Mobility status PT Visit Diagnosis: Muscle weakness (generalized) (M62.81);Difficulty in walking, not elsewhere classified (R26.2);Pain Pain - Right/Left: Left Pain - part of body: Leg     Time: 1232-1316(did not charge a unit due to talking) PT Time Calculation (min) (ACUTE ONLY): 44 min  Charges:  $Gait Training: 23-37 mins                    Verdene Lennert, PT, DPT  Acute Rehabilitation [phone] pager #[phone] office     07/24/2019, 2:19 PM

## 2019-07-24 NOTE — Plan of Care (Signed)
  Problem: Clinical Measurements: Goal: Respiratory complications will improve Outcome: Progressing   

## 2019-07-25 ENCOUNTER — Encounter (HOSPITAL_COMMUNITY): Payer: Self-pay | Admitting: Internal Medicine

## 2019-07-25 ENCOUNTER — Inpatient Hospital Stay (HOSPITAL_COMMUNITY): Payer: 59

## 2019-07-25 DIAGNOSIS — Z954 Presence of other heart-valve replacement: Secondary | ICD-10-CM

## 2019-07-25 DIAGNOSIS — I350 Nonrheumatic aortic (valve) stenosis: Secondary | ICD-10-CM

## 2019-07-25 LAB — COMPREHENSIVE METABOLIC PANEL
ALT: 19 U/L (ref 0–44)
AST: 35 U/L (ref 15–41)
Albumin: 3.1 g/dL — ABNORMAL LOW (ref 3.5–5.0)
Alkaline Phosphatase: 268 U/L — ABNORMAL HIGH (ref 38–126)
Anion gap: 11 (ref 5–15)
BUN: 16 mg/dL (ref 6–20)
CO2: 34 mmol/L — ABNORMAL HIGH (ref 22–32)
Calcium: 8.9 mg/dL (ref 8.9–10.3)
Chloride: 93 mmol/L — ABNORMAL LOW (ref 98–111)
Creatinine, Ser: 1 mg/dL (ref 0.61–1.24)
GFR calc Af Amer: >60 mL/min (ref >60)
GFR calc non Af Amer: >60 mL/min (ref >60)
Glucose, Bld: 150 mg/dL — ABNORMAL HIGH (ref 70–99)
Potassium: 4.2 mmol/L (ref 3.5–5.1)
Sodium: 138 mmol/L (ref 135–145)
Total Bilirubin: 2.6 mg/dL — ABNORMAL HIGH (ref 0.3–1.2)
Total Protein: 6.6 g/dL (ref 6.5–8.1)

## 2019-07-25 LAB — CBC WITH DIFFERENTIAL/PLATELET
Abs Immature Granulocytes: 0.06 10*3/uL (ref 0.00–0.07)
Basophils Absolute: 0 10*3/uL (ref 0.0–0.1)
Basophils Relative: 1 %
Eosinophils Absolute: 0.1 10*3/uL (ref 0.0–0.5)
Eosinophils Relative: 1 %
HCT: 42.9 % (ref 39.0–52.0)
Hemoglobin: 13.7 g/dL (ref 13.0–17.0)
Immature Granulocytes: 1 %
Lymphocytes Relative: 12 %
Lymphs Abs: 0.9 10*3/uL (ref 0.7–4.0)
MCH: 30 pg (ref 26.0–34.0)
MCHC: 31.9 g/dL (ref 30.0–36.0)
MCV: 93.9 fL (ref 80.0–100.0)
Monocytes Absolute: 0.4 10*3/uL (ref 0.1–1.0)
Monocytes Relative: 6 %
Neutro Abs: 5.5 10*3/uL (ref 1.7–7.7)
Neutrophils Relative %: 79 %
Platelets: 193 10*3/uL (ref 150–400)
RBC: 4.57 MIL/uL (ref 4.22–5.81)
RDW: 16.5 % — ABNORMAL HIGH (ref 11.5–15.5)
WBC: 6.9 10*3/uL (ref 4.0–10.5)
nRBC: 0 % (ref 0.0–0.2)

## 2019-07-25 LAB — D-DIMER, QUANTITATIVE: D-Dimer, Quant: 3.26 ug/mL-FEU — ABNORMAL HIGH (ref 0.00–0.50)

## 2019-07-25 LAB — MAGNESIUM: Magnesium: 2 mg/dL (ref 1.7–2.4)

## 2019-07-25 LAB — HEPATITIS PANEL, ACUTE
HCV Ab: NONREACTIVE
Hep A IgM: NONREACTIVE
Hep B C IgM: NONREACTIVE
Hepatitis B Surface Ag: NONREACTIVE

## 2019-07-25 LAB — GLUCOSE, CAPILLARY: Glucose-Capillary: 110 mg/dL — ABNORMAL HIGH (ref 70–99)

## 2019-07-25 LAB — C-REACTIVE PROTEIN: CRP: 3.5 mg/dL — ABNORMAL HIGH (ref <1.0)

## 2019-07-25 MED ORDER — SODIUM CHLORIDE 0.9% FLUSH
10.0000 mL | Freq: Two times a day (BID) | INTRAVENOUS | Status: DC
  Administered 2019-07-25 – 2019-07-26 (×2): 10 mL
  Administered 2019-07-27: 40 mL
  Administered 2019-07-28 – 2019-07-29 (×3): 10 mL
  Administered 2019-07-29: 40 mL
  Administered 2019-07-30 – 2019-08-06 (×13): 10 mL

## 2019-07-25 MED ORDER — IOHEXOL 350 MG/ML SOLN
100.0000 mL | Freq: Once | INTRAVENOUS | Status: AC | PRN
  Administered 2019-07-25: 100 mL via INTRAVENOUS

## 2019-07-25 MED ORDER — SODIUM CHLORIDE 0.9% FLUSH: 10.0000 mL | INTRAVENOUS | Status: DC | PRN

## 2019-07-25 MED ORDER — DOXYCYCLINE HYCLATE 100 MG PO TABS
100.0000 mg | ORAL_TABLET | Freq: Two times a day (BID) | ORAL | Status: DC
  Administered 2019-07-25 – 2019-07-27 (×5): 100 mg via ORAL
  Filled 2019-07-25 (×5): qty 1

## 2019-07-25 NOTE — Progress Notes (Signed)
PROGRESS NOTE                                                                                                                                                                                                             Patient Demographics:    Steve Richards, is a 54 y.o. male, DOB - Dec 03, 1965, KVQ:[id number]  Admit date - 07/21/2019   Admitting Physician Karmen Bongo, MD  Outpatient Primary MD for the patient is System, Pcp Not In  LOS - 4   Chief Complaint  Patient presents with  . Shortness of Breath  . Leg Swelling       Brief Narrative     Steve Richards is a 54 y.o. male with medical history significant of R pleural effusion in April 2021 presenting with SOB and LE edema.    Patient did not follow with PCP for last 6 years, patient with recent work-up as an outpatient and negative for CHF, volume overload with thoracentesis status post drain, as well evidence of liver cirrhosis, and recent diagnosis of left lower extremity cellulitis where he was started on Keflex, ED secondary to dyspnea, evidence of volume overload, as well he tested Covid positive on admission .   Subjective:    Steve Richards today has, No headache, No chest pain, No abdominal pain, overall report is feeling better, dyspnea has improved as well.   Assessment  & Plan :    Principal Problem:   New onset of congestive heart failure (HCC) Active Problems:   Hepatic cirrhosis (HCC)   Pleural effusion on right   Systolic ejection murmur   Cellulitis and abscess of left lower extremity   Hyperglycemia   Obesity, Class III, BMI 40-49.9 (morbid obesity) (HCC)   Acute systolic/diastolic CHF -Did not follow with his PCP for last 6 years, with poor outpatient follow-up. -Evidence of volume overload on imaging, recurrent pleural effusion, elevated BNP and lower extremity edema. -Continue with IV diuresis, he appears to be tolerating 40 mg of IV Lasix twice daily, so far -10 L  since hospital stay, blood pressure on the lower side, but he is asymptomatic, will give hour-long [place] blood pressure more than 90 . -2D echo significant for low EF 40 to 45%, with grade 2 diastolic CHF, cardiology has been consulted regarding further recommendations. -Blood pressure is persistently on the lower  side, there is no room to add any beta-blockers or ACEi/ARB  Severe aortic stenosis -Management per cardiology . -likely contributing to multiple comorbidities, possibly causing cardiac cirrhosis, his lower extremity edema contributing to venous stasis and cellulitis. -Structural heart team input greatly appreciated, plan for imaging this weekend, and cardiac cath on Tuesday.   COVID-19 infection -Patient denies any fever, cough currently, no evidence of pneumonia on imaging, he has no hypoxia, so for now no indication for steroids and remdesivir, but will continue to monitor closely especially with significantly elevated inflammatory markers. -D-dimers elevated at 6 on admission, it is contrary it is currently trending down, tinea with current DVT prophylaxis dose, his venous Doppler negative for DVT .  COVID-19 Labs  Recent Labs    07/23/19 0622 07/24/19 1237 07/25/19 0858  DDIMER 3.88* 3.59* 3.26*  FERRITIN 203  --   --   CRP 6.0* 4.9* 3.5*    Lab Results  Component Value Date   SARSCOV2NAA POSITIVE (A) 07/21/2019   [place] NEGATIVE 06/23/2019   Liver cirrhosis -Patient denies any history of alcohol abuse in the past, this is most likely related to cardiac cirrhosis versus NASH. -Right upper quadrant ultrasound. -We will DC Aldactone given soft blood pressure -Check acute hepatitis panel  Left lower extremity cellulitis -He appears to be having left lower extremity cellulitis, which did not respond to Keflex, as well there is evidence of venous stasis disease contributing to his infection, as well significant lower extremity edema as well. -Initially on IV  vancomycin, patient IV cefazolin, I will change doxycycline.  Hyperglycemia -A1c is 6.1  Morbid obesity  Hypothyroidism -TSH elevated at 14, with low normal free T4 at 0.65, started on low-dose Synthroid   Code Status : Full  Disposition Plan  :  Status is: Inpatient  Remains inpatient appropriate because:IV treatments appropriate due to intensity of illness or inability to take PO   Dispo: The patient is from: Home              Anticipated d/c is to: Home              Anticipated d/c date is: 3 days              Patient currently is not medically stable to d/c.  Patient still with significant volume overload, requiring further IV diuresis.       Consults  :  Cardiology  Procedures  : None  DVT Prophylaxis  :  [place] lovenox  Lab Results  Component Value Date   PLT 193 07/25/2019    Antibiotics  :   Anti-infectives (From admission, onward)   Start     Dose/Rate Route Frequency Ordered Stop   07/22/19 1400  ceFAZolin (ANCEF) IVPB 2g/100 mL premix     2 g 200 mL/hr over 30 Minutes Intravenous Every 8 hours 07/22/19 1223     07/22/19 0000  vancomycin (VANCOREADY) IVPB 1250 mg/250 mL  Status:  Discontinued     1,250 mg 166.7 mL/hr over 90 Minutes Intravenous Every 12 hours 07/21/19 1141 07/22/19 1223   07/21/19 1145  vancomycin (VANCOREADY) IVPB 2000 mg/400 mL     2,000 mg 200 mL/hr over 120 Minutes Intravenous  Once 07/21/19 1141 07/21/19 1509        Objective:   Vitals:   07/24/19 2254 07/24/19 2256 07/25/19 0137 07/25/19 0509  BP: 93/69 95/70  101/69  Pulse: 83 81 81 77  Resp:    20  Temp:  98.1 F (36.7 C)  TempSrc:    Oral  SpO2: 90% (!) 89% 93% 94%  Weight:    127.9 kg  Height:        Wt Readings from Last 3 Encounters:  07/25/19 127.9 kg  09/24/11 125.2 kg     Intake/Output Summary (Last 24 hours) at 07/25/2019 1518 Last data filed at 07/25/2019 1311 Gross per 24 hour  Intake 1113.21 ml  Output 2975 ml  Net -1861.79 ml     Physical  Exam  Awake Alert, Oriented X 3, No new F.N deficits, Normal affect Symmetrical Chest wall movement, Good air movement bilaterally, CTAB RRR,No Gallops,Rubs ,+ Murmurs, No Parasternal Heave +ve B.Sounds, Abd Soft, No tenderness, No rebound - guarding or rigidity. No Cyanosis, Clubbing, remains with lower extremity edema, appears to be improving, lower extremity with Ace wrap        Data Review:    CBC Recent Labs  Lab 07/21/19 0820 07/22/19 0823 07/23/19 0622 07/24/19 1237 07/25/19 0858  WBC 8.9 7.8 7.3 7.9 6.9  HGB 14.5 15.2 13.6 14.6 13.7  HCT 46.0 46.9 42.5 45.8 42.9  PLT 225 222 197 215 193  MCV 94.3 93.8 93.6 92.9 93.9  MCH 29.7 30.4 30.0 29.6 30.0  MCHC 31.5 32.4 32.0 31.9 31.9  RDW 16.5* 16.5* 16.4* 16.5* 16.5*  LYMPHSABS  --  0.9 0.9 0.9 0.9  MONOABS  --  0.6 0.6 0.5 0.4  EOSABS  --  0.1 0.1 0.1 0.1  BASOSABS  --  0.0 0.1 0.0 0.0    Chemistries  Recent Labs  Lab 07/21/19 0820 07/22/19 0823 07/23/19 0622 07/24/19 1237 07/25/19 0858  NA 136 139 138 139 138  K 4.2 3.7 3.9 4.1 4.2  CL 98 99 96* 93* 93*  CO2 26 30 33* 34* 34*  GLUCOSE 153* 102* 112* 116* 150*  BUN 14 13 16 18 16   CREATININE 0.93 0.88 0.96 0.95 1.00  CALCIUM 8.6* 8.6* 8.6* 9.1 8.9  MG  --  1.8 1.8 2.0 2.0  AST  --  34 27 36 35  ALT  --  29 22 22 19   ALKPHOS  --  264* 233* 284* 268*  BILITOT  --  3.0* 2.6* 2.8* 2.6*   ------------------------------------------------------------------------------------------------------------------ No results for input(s): CHOL, HDL, LDLCALC, TRIG, CHOLHDL, LDLDIRECT in the last 72 hours.  Lab Results  Component Value Date   HGBA1C 6.1 (H) 07/21/2019   ------------------------------------------------------------------------------------------------------------------ No results for input(s): TSH, T4TOTAL, T3FREE, THYROIDAB in the last 72 hours.  Invalid input(s):  FREET3 ------------------------------------------------------------------------------------------------------------------ Recent Labs    07/23/19 0622  FERRITIN 203    Coagulation profile No results for input(s): INR, PROTIME in the last 168 hours.  Recent Labs    07/24/19 1237 07/25/19 0858  DDIMER 3.59* 3.26*    Cardiac Enzymes No results for input(s): CKMB, TROPONINI, MYOGLOBIN in the last 168 hours.  Invalid input(s): CK ------------------------------------------------------------------------------------------------------------------    Component Value Date/Time   BNP 1,142.6 (H) 07/21/2019 1316    Inpatient Medications  Scheduled Meds: . aspirin EC  81 mg Oral Daily  . docusate sodium  100 mg Oral BID  . enoxaparin (LOVENOX) injection  65 mg Subcutaneous Q24H  . feeding supplement (ENSURE ENLIVE)  237 mL Oral Q1500  . feeding supplement (PRO-STAT SUGAR FREE 64)  30 mL Oral Daily  . furosemide  40 mg Intravenous Q12H  . levothyroxine  25 mcg Oral Q0600  . sodium chloride flush  10-40 mL Intracatheter Q12H  . sodium  chloride flush  3 mL Intravenous Q12H   Continuous Infusions: . sodium chloride    .  ceFAZolin (ANCEF) IV 2 g (07/25/19 1408)   PRN Meds:.sodium chloride, acetaminophen **OR** acetaminophen, bisacodyl, hydrALAZINE, HYDROcodone-acetaminophen, ondansetron **OR** ondansetron (ZOFRAN) IV, polyethylene glycol, sodium chloride flush, sodium chloride flush  Micro Results Recent Results (from the past 240 hour(s))  Culture, blood (routine x 2)     Status: None (Preliminary result)   Collection Time: 07/21/19 12:37 PM   Specimen: BLOOD RIGHT HAND  Result Value Ref Range Status   Specimen Description BLOOD RIGHT HAND  Final   Special Requests   Final    BOTTLES DRAWN AEROBIC ONLY Blood Culture results may not be optimal due to an inadequate volume of blood received in culture bottles   Culture   Final    NO GROWTH 4 DAYS Performed at [facility], [place] [address], [place], [place] [address]    Report Status PENDING  Incomplete  SARS Coronavirus 2 by RT PCR (hospital order, performed in [facility] hospital lab) Nasopharyngeal Nasopharyngeal Swab     Status: Abnormal   Collection Time: 07/21/19 12:37 PM   Specimen: Nasopharyngeal Swab  Result Value Ref Range Status   SARS Coronavirus 2 POSITIVE (A) NEGATIVE Final    Comment: RESULT CALLED TO, READ BACK BY AND VERIFIED WITH: Angelina Ok RN 16:45 07/21/19 ([email or web address]) (NOTE) SARS-CoV-2 target nucleic acids are DETECTED SARS-CoV-2 RNA is generally detectable in upper respiratory specimens  during the acute phase of infection.  Positive results are indicative  of the presence of the identified virus, but do not rule out bacterial infection or co-infection with other pathogens not detected by the test.  Clinical correlation with patient history and  other diagnostic information is necessary to determine patient infection status.  The expected result is negative. Fact Sheet for Patients:   StrictlyIdeas.no  Fact Sheet for Healthcare Providers:   BankingDealers.co.za   This test is not yet approved or cleared by the [place] FDA and  has been authorized for detection and/or diagnosis of SARS-CoV-2 by FDA under an Emergency Use Authorization (EUA).  This EUA will remain in effect (meaning this  test can be used) for the duration of  the COVID-19 declaration under Section 564(b)(1) of the Act, 21 U.S.C. section 360-bbb-3(b)(1), unless the authorization is terminated or revoked sooner. Performed at [facility], [place] [address] [place], [place] [address]   Culture, blood (routine x 2)     Status: None (Preliminary result)   Collection Time: 07/21/19 12:38 PM   Specimen: BLOOD  Result Value Ref Range Status   Specimen Description BLOOD RIGHT ANTECUBITAL  Final   Special Requests   Final    BOTTLES DRAWN AEROBIC AND ANAEROBIC  Blood Culture results may not be optimal due to an inadequate volume of blood received in culture bottles   Culture   Final    NO GROWTH 4 DAYS Performed at [facility], [place] [address], [place], [place] [address]    Report Status PENDING  Incomplete    Radiology Reports DG Chest 2 View  Result Date: 07/21/2019 CLINICAL DATA:  Chest pain and shortness of breath EXAM: CHEST - 2 VIEW COMPARISON:  06/25/2018 FINDINGS: Congested appearance of vessels with small pleural effusions. Cardiomegaly that is chronic. Negative aortic contours. IMPRESSION: Cardiomegaly with vascular congestion and small pleural effusions. Electronically Signed   By: Monte Fantasia M.D.   On: 07/21/2019 08:42  ECHOCARDIOGRAM COMPLETE  Result Date: 07/21/2019    ECHOCARDIOGRAM REPORT   Patient Name:   Steve Richards Date of Exam: 07/21/2019 Medical Rec #:  [id number]   Height:       72.0 in Accession #:    [id number]  Weight:       310.0 lb Date of Birth:  September 08, 1965   BSA:          2.567 m Patient Age:    31 years    BP:           102/73 mmHg Patient Gender: M           HR:           73 bpm. Exam Location:  Inpatient Procedure: 2D Echo Indications:    CHF-Acute Systolic 706.23 / J62.83  History:        Patient has no prior history of Echocardiogram examinations.                 Risk Factors:Obesity. Pleural effusion on right.  Sonographer:    Vikki Ports Turrentine Referring Phys: [place]  1. Left ventricular ejection fraction, by estimation, is 40 to 45%. The left ventricle has mildly decreased function. The left ventricle demonstrates global hypokinesis. There is moderate left ventricular hypertrophy. Left ventricular diastolic parameters are consistent with Grade II diastolic dysfunction (pseudonormalization).  2. Right ventricular systolic function is mildly reduced. The right ventricular size is normal. There is moderately elevated pulmonary artery systolic pressure.  3. Left atrial size was moderately  dilated.  4. The mitral valve is abnormal. Trivial mitral valve regurgitation.  5. Cannot exclude a bicuspid valve. Aortic valve regurgitation is mild. Critical aortic valve stenosis. Aortic valve area, by VTI measures 0.73 cm. Aortic valve mean gradient measures 96.2 mmHg. Peak gradient of 137 mmHg. Aortic valve Vmax measures 5.86 m/s.  6. The inferior vena cava is dilated in size with <50% respiratory variability, suggesting right atrial pressure of 15 mmHg.  7. Moderate pleural effusion in the right lateral region. FINDINGS  Left Ventricle: Left ventricular ejection fraction, by estimation, is 40 to 45%. The left ventricle has mildly decreased function. The left ventricle demonstrates global hypokinesis. The left ventricular internal cavity size was normal in size. There is  moderate left ventricular hypertrophy. Left ventricular diastolic parameters are consistent with Grade II diastolic dysfunction (pseudonormalization). Indeterminate filling pressures. Right Ventricle: The right ventricular size is normal. No increase in right ventricular wall thickness. Right ventricular systolic function is mildly reduced. There is moderately elevated pulmonary artery systolic pressure. The tricuspid regurgitant velocity is 3.35 m/s, and with an assumed right atrial pressure of 15 mmHg, the estimated right ventricular systolic pressure is 15.1 mmHg. Left Atrium: Left atrial size was moderately dilated. Right Atrium: Right atrial size was normal in size. Pericardium: There is no evidence of pericardial effusion. Mitral Valve: The mitral valve is abnormal. Mild to moderate mitral annular calcification. Trivial mitral valve regurgitation. Tricuspid Valve: The tricuspid valve is grossly normal. Tricuspid valve regurgitation is mild. Aortic Valve: The aortic valve is tricuspid. . There is moderate thickening and severe calcifcation of the aortic valve. Aortic valve regurgitation is mild. Aortic regurgitation PHT measures 374  msec. Severe aortic stenosis is present. There is moderate thickening of the aortic valve. There is severe calcifcation of the aortic valve. Aortic valve mean gradient measures 96.2 mmHg. Aortic valve peak gradient measures 137.3 mmHg. Aortic valve area, by VTI measures 0.73 cm. Pulmonic Valve: The pulmonic valve was  normal in structure. Pulmonic valve regurgitation is not visualized. Aorta: The aortic root and ascending aorta are structurally normal, with no evidence of dilitation. Venous: The inferior vena cava is dilated in size with less than 50% respiratory variability, suggesting right atrial pressure of 15 mmHg. IAS/Shunts: No atrial level shunt detected by color flow Doppler. Additional Comments: There is a moderate pleural effusion in the right lateral region.  LEFT VENTRICLE PLAX 2D LVIDd:         5.50 cm  Diastology LVIDs:         4.40 cm  LV e' lateral:   9.23 cm/s LV PW:         1.40 cm  LV E/e' lateral: 10.2 LV IVS:        1.40 cm  LV e' medial:    7.05 cm/s LVOT diam:     2.50 cm  LV E/e' medial:  13.4 LV SV:         108 LV SV Index:   42 LVOT Area:     4.91 cm  RIGHT VENTRICLE RV S prime:     7.20 cm/s TAPSE (M-mode): 1.2 cm LEFT ATRIUM              Index       RIGHT ATRIUM           Index LA diam:        5.90 cm  2.30 cm/m  RA Area:     22.60 cm LA Vol (A2C):   121.0 ml 47.14 ml/m RA Volume:   68.80 ml  26.80 ml/m LA Vol (A4C):   95.5 ml  37.21 ml/m LA Biplane Vol: 110.0 ml 42.86 ml/m  AORTIC VALVE AV Area (Vmax):    0.82 cm AV Area (Vmean):   0.72 cm AV Area (VTI):     0.73 cm AV Vmax:           585.80 cm/s AV Vmean:          474.000 cm/s AV VTI:            1.480 m AV Peak Grad:      137.3 mmHg AV Mean Grad:      96.2 mmHg LVOT Vmax:         97.30 cm/s LVOT Vmean:        69.800 cm/s LVOT VTI:          0.220 m LVOT/AV VTI ratio: 0.15 AI PHT:            374 msec  AORTA Ao Root diam: 3.00 cm MITRAL VALVE               TRICUSPID VALVE MV Area (PHT): 5.54 cm    TR Peak grad:   44.9 mmHg MV  Decel Time: 137 msec    TR Vmax:        335.00 cm/s MV E velocity: 94.30 cm/s MV A velocity: 46.30 cm/s  SHUNTS MV E/A ratio:  2.04        Systemic VTI:  0.22 m                            Systemic Diam: 2.50 cm Lyman Bishop MD Electronically signed by Lyman Bishop MD Signature Date/Time: 07/21/2019/3:42:23 PM    Final    VAS [place] LOWER EXTREMITY VENOUS (DVT)  Result Date: 07/23/2019  Lower Venous DVTStudy Indications: Edema, and elevated ddimer.  Limitations: Body habitus and poor  ultrasound/tissue interface. Comparison Study: no prior Performing Technologist: Abram Sander RVS  Examination Guidelines: A complete evaluation includes B-mode imaging, spectral Doppler, color Doppler, and power Doppler as needed of all accessible portions of each vessel. Bilateral testing is considered an integral part of a complete examination. Limited examinations for reoccurring indications may be performed as noted. The reflux portion of the exam is performed with the patient in reverse Trendelenburg.  +---------+---------------+---------+-----------+----------+--------------+ RIGHT    CompressibilityPhasicitySpontaneityPropertiesThrombus Aging +---------+---------------+---------+-----------+----------+--------------+ CFV      Full           Yes      Yes                                 +---------+---------------+---------+-----------+----------+--------------+ SFJ      Full                                                        +---------+---------------+---------+-----------+----------+--------------+ FV Prox  Full                                                        +---------+---------------+---------+-----------+----------+--------------+ FV Mid                  Yes      Yes                                 +---------+---------------+---------+-----------+----------+--------------+ FV Distal               Yes      Yes                                  +---------+---------------+---------+-----------+----------+--------------+ PFV      Full                                                        +---------+---------------+---------+-----------+----------+--------------+ POP      Full           Yes      Yes                                 +---------+---------------+---------+-----------+----------+--------------+ PTV      Full                                                        +---------+---------------+---------+-----------+----------+--------------+ PERO     Full                                                        +---------+---------------+---------+-----------+----------+--------------+   +---------+---------------+---------+-----------+----------+--------------+  LEFT     CompressibilityPhasicitySpontaneityPropertiesThrombus Aging +---------+---------------+---------+-----------+----------+--------------+ CFV      Full           Yes      Yes                                 +---------+---------------+---------+-----------+----------+--------------+ SFJ      Full                                                        +---------+---------------+---------+-----------+----------+--------------+ FV Prox  Full                                                        +---------+---------------+---------+-----------+----------+--------------+ FV Mid   Full                                                        +---------+---------------+---------+-----------+----------+--------------+ FV Distal               Yes      Yes                                 +---------+---------------+---------+-----------+----------+--------------+ PFV      Full                                                        +---------+---------------+---------+-----------+----------+--------------+ POP      Full           Yes      Yes                                  +---------+---------------+---------+-----------+----------+--------------+ PTV      Full                                                        +---------+---------------+---------+-----------+----------+--------------+ PERO                                                  Not visualized +---------+---------------+---------+-----------+----------+--------------+     Summary: BILATERAL: - No evidence of deep vein thrombosis seen in the lower extremities, bilaterally. -   *See table(s) above for measurements and observations. Electronically signed by Monica Martinez MD on 07/23/2019 at 5:31:24 PM.    Final     Phillips Climes M.D on 07/25/2019 at 3:18 PM  Between  7am to 7pm - Pager - [phone]  After 7pm go to [email or web address] - password [email or web address]  [facility] -  Office  [phone]

## 2019-07-26 LAB — C-REACTIVE PROTEIN: CRP: 3 mg/dL — ABNORMAL HIGH (ref <1.0)

## 2019-07-26 LAB — COMPREHENSIVE METABOLIC PANEL
ALT: 18 U/L (ref 0–44)
AST: 36 U/L (ref 15–41)
Albumin: 3.2 g/dL — ABNORMAL LOW (ref 3.5–5.0)
Alkaline Phosphatase: 253 U/L — ABNORMAL HIGH (ref 38–126)
Anion gap: 7 (ref 5–15)
BUN: 16 mg/dL (ref 6–20)
CO2: 35 mmol/L — ABNORMAL HIGH (ref 22–32)
Calcium: 8.7 mg/dL — ABNORMAL LOW (ref 8.9–10.3)
Chloride: 94 mmol/L — ABNORMAL LOW (ref 98–111)
Creatinine, Ser: 0.92 mg/dL (ref 0.61–1.24)
GFR calc Af Amer: >60 mL/min (ref >60)
GFR calc non Af Amer: >60 mL/min (ref >60)
Glucose, Bld: 119 mg/dL — ABNORMAL HIGH (ref 70–99)
Potassium: 3.6 mmol/L (ref 3.5–5.1)
Sodium: 136 mmol/L (ref 135–145)
Total Bilirubin: 2.3 mg/dL — ABNORMAL HIGH (ref 0.3–1.2)
Total Protein: 6.8 g/dL (ref 6.5–8.1)

## 2019-07-26 LAB — MAGNESIUM: Magnesium: 2 mg/dL (ref 1.7–2.4)

## 2019-07-26 LAB — CULTURE, BLOOD (ROUTINE X 2)
Culture: NO GROWTH
Culture: NO GROWTH

## 2019-07-26 LAB — CBC WITH DIFFERENTIAL/PLATELET
Abs Immature Granulocytes: 0.08 10*3/uL — ABNORMAL HIGH (ref 0.00–0.07)
Basophils Absolute: 0 10*3/uL (ref 0.0–0.1)
Basophils Relative: 1 %
Eosinophils Absolute: 0.1 10*3/uL (ref 0.0–0.5)
Eosinophils Relative: 1 %
HCT: 43 % (ref 39.0–52.0)
Hemoglobin: 13.6 g/dL (ref 13.0–17.0)
Immature Granulocytes: 1 %
Lymphocytes Relative: 13 %
Lymphs Abs: 1 10*3/uL (ref 0.7–4.0)
MCH: 29.6 pg (ref 26.0–34.0)
MCHC: 31.6 g/dL (ref 30.0–36.0)
MCV: 93.5 fL (ref 80.0–100.0)
Monocytes Absolute: 0.5 10*3/uL (ref 0.1–1.0)
Monocytes Relative: 6 %
Neutro Abs: 6.1 10*3/uL (ref 1.7–7.7)
Neutrophils Relative %: 78 %
Platelets: 197 10*3/uL (ref 150–400)
RBC: 4.6 MIL/uL (ref 4.22–5.81)
RDW: 16.8 % — ABNORMAL HIGH (ref 11.5–15.5)
WBC: 7.9 10*3/uL (ref 4.0–10.5)
nRBC: 0 % (ref 0.0–0.2)

## 2019-07-26 LAB — D-DIMER, QUANTITATIVE: D-Dimer, Quant: 3.03 ug/mL-FEU — ABNORMAL HIGH (ref 0.00–0.50)

## 2019-07-26 MED ORDER — SACCHAROMYCES BOULARDII 250 MG PO CAPS
250.0000 mg | ORAL_CAPSULE | Freq: Two times a day (BID) | ORAL | Status: DC
  Administered 2019-07-26 – 2019-08-05 (×21): 250 mg via ORAL
  Filled 2019-07-26 (×22): qty 1

## 2019-07-26 NOTE — Progress Notes (Signed)
    Chart reviewed.  Diuresing well per primary team and tolerating current meds.   Net negative 14 liters and 3.6 last 24 hours. Continue current diuresis.  BP seems to be tolerating current diuresis.

## 2019-07-26 NOTE — Progress Notes (Signed)
PROGRESS NOTE                                                                                                                                                                                                             Patient Demographics:    Steve Richards, is a 54 y.o. male, DOB - 1965/11/13, UQJ:[id number]  Admit date - 07/21/2019   Admitting Physician Karmen Bongo, MD  Outpatient Primary MD for the patient is System, Pcp Not In  LOS - 5   Chief Complaint  Patient presents with  . Shortness of Breath  . Leg Swelling       Brief Narrative     Steve Richards is a 54 y.o. male with medical history significant of R pleural effusion in April 2021 presenting with SOB and LE edema.    Patient did not follow with PCP for last 6 years, patient with recent work-up as an outpatient and negative for CHF, volume overload with thoracentesis status post drain, as well evidence of liver cirrhosis, and recent diagnosis of left lower extremity cellulitis where he was started on Keflex, ED secondary to dyspnea, evidence of volume overload, as well he tested Covid positive on admission .   Subjective:    Steve Richards today has, No headache, No chest pain, No abdominal pain, overall report is feeling better, dyspnea has significantly improved as well.   Assessment  & Plan :    Principal Problem:   New onset of congestive heart failure (HCC) Active Problems:   Hepatic cirrhosis (HCC)   Pleural effusion on right   Systolic ejection murmur   Cellulitis and abscess of left lower extremity   Hyperglycemia   Obesity, Class III, BMI 40-49.9 (morbid obesity) (HCC)   Acute systolic/diastolic CHF -Did not follow with his PCP for last 6 years, with poor outpatient follow-up. -Evidence of volume overload on imaging, recurrent pleural effusion, elevated BNP and lower extremity edema. -Continue with IV diuresis, seems to be diuresing appropriately on current dose, and  blood pressure has been tolerating it, so continue with 40 mg IV twice daily, still significantly volume overloaded and for the need of IV diuresis, continue with daily labs close monitoring . -2D echo significant for low EF 40 to 45%, with grade 2 diastolic CHF, cardiology has been consulted regarding further recommendations. -Blood pressure is persistently  on the lower side, there is no room to add any beta-blockers or ACEi/ARB  Severe aortic stenosis -Management per cardiology . -likely contributing to multiple comorbidities, possibly causing cardiac cirrhosis, his lower extremity edema contributing to venous stasis and cellulitis. -Structural heart team input greatly appreciated, plan for imaging this weekend, and cardiac cath on Tuesday.   COVID-19 infection -Patient denies any fever, cough currently, no evidence of pneumonia on imaging, he has no hypoxia, so for now no indication for steroids and remdesivir, but will continue to monitor closely especially with significantly elevated inflammatory markers. -D-dimers elevated at 6 on admission, it is contrary it is currently trending down, tinea with current DVT prophylaxis dose, his venous Doppler negative for DVT .  COVID-19 Labs  Recent Labs    07/24/19 1237 07/25/19 0858 07/26/19 0350  DDIMER 3.59* 3.26* 3.03*  CRP 4.9* 3.5* 3.0*    Lab Results  Component Value Date   SARSCOV2NAA POSITIVE (A) 07/21/2019   [place] NEGATIVE 06/23/2019   Liver cirrhosis -Patient denies any history of alcohol abuse in the past, this is most likely related to cardiac cirrhosis versus NASH. -We will DC Aldactone given soft blood pressure -Negative hepatitis panel  Left lower extremity cellulitis -He appears to be having left lower extremity cellulitis, which did not respond to Keflex, as well there is evidence of venous stasis disease contributing to his infection, as well significant lower extremity edema as well. -Initially on IV  vancomycin, patient IV cefazolin, currently on doxycycline  Hyperglycemia -A1c is 6.1  Morbid obesity  Hypothyroidism -TSH elevated at 14, with low normal free T4 at 0.65, started on low-dose Synthroid   Code Status : Full  Disposition Plan  :  Status is: Inpatient  Remains inpatient appropriate because:IV treatments appropriate due to intensity of illness or inability to take PO   Dispo: The patient is from: Home              Anticipated d/c is to: Home              Anticipated d/c date is: 3 days              Patient currently is not medically stable to d/c.  Patient still with significant volume overload, requiring further IV diuresis.  Plan for cardiac cath on Tuesday       Consults  :  Cardiology  Procedures  : None  DVT Prophylaxis  :  [place] lovenox  Lab Results  Component Value Date   PLT 197 07/26/2019    Antibiotics  :   Anti-infectives (From admission, onward)   Start     Dose/Rate Route Frequency Ordered Stop   07/25/19 1530  doxycycline (VIBRA-TABS) tablet 100 mg     100 mg Oral Every 12 hours 07/25/19 1521     07/22/19 1400  ceFAZolin (ANCEF) IVPB 2g/100 mL premix  Status:  Discontinued     2 g 200 mL/hr over 30 Minutes Intravenous Every 8 hours 07/22/19 1223 07/25/19 1521   07/22/19 0000  vancomycin (VANCOREADY) IVPB 1250 mg/250 mL  Status:  Discontinued     1,250 mg 166.7 mL/hr over 90 Minutes Intravenous Every 12 hours 07/21/19 1141 07/22/19 1223   07/21/19 1145  vancomycin (VANCOREADY) IVPB 2000 mg/400 mL     2,000 mg 200 mL/hr over 120 Minutes Intravenous  Once 07/21/19 1141 07/21/19 1509        Objective:   Vitals:   07/25/19 1940 07/26/19 0355 07/26/19 0357 07/26/19 1408  BP: 108/87 91/64  92/73  Pulse: 88 76    Resp: 20 20  17   Temp: 97.6 F (36.4 C) (!) 97.5 F (36.4 C)  97.9 F (36.6 C)  TempSrc: Oral Oral  Oral  SpO2: 96% 100%  99%  Weight:   125.8 kg   Height:        Wt Readings from Last 3 Encounters:  07/26/19 125.8  kg  09/24/11 125.2 kg     Intake/Output Summary (Last 24 hours) at 07/26/2019 1449 Last data filed at 07/26/2019 1408 Gross per 24 hour  Intake 650.66 ml  Output 4725 ml  Net -4074.34 ml     Physical Exam  Awake Alert, Oriented X 3, No new F.N deficits, Normal affect Symmetrical Chest wall movement, Good air movement bilaterally, CTAB RRR,No Gallops,Rubs, systolic murmur present, No Parasternal Heave +ve B.Sounds, Abd Soft, No tenderness, No rebound - guarding or rigidity. No Cyanosis, Clubbing, lower extremity edema is improving, but remains significant    Data Review:    CBC Recent Labs  Lab 07/22/19 0823 07/23/19 0622 07/24/19 1237 07/25/19 0858 07/26/19 0350  WBC 7.8 7.3 7.9 6.9 7.9  HGB 15.2 13.6 14.6 13.7 13.6  HCT 46.9 42.5 45.8 42.9 43.0  PLT 222 197 215 193 197  MCV 93.8 93.6 92.9 93.9 93.5  MCH 30.4 30.0 29.6 30.0 29.6  MCHC 32.4 32.0 31.9 31.9 31.6  RDW 16.5* 16.4* 16.5* 16.5* 16.8*  LYMPHSABS 0.9 0.9 0.9 0.9 1.0  MONOABS 0.6 0.6 0.5 0.4 0.5  EOSABS 0.1 0.1 0.1 0.1 0.1  BASOSABS 0.0 0.1 0.0 0.0 0.0    Chemistries  Recent Labs  Lab 07/22/19 0823 07/23/19 0622 07/24/19 1237 07/25/19 0858 07/26/19 0350  NA 139 138 139 138 136  K 3.7 3.9 4.1 4.2 3.6  CL 99 96* 93* 93* 94*  CO2 30 33* 34* 34* 35*  GLUCOSE 102* 112* 116* 150* 119*  BUN 13 16 18 16 16   CREATININE 0.88 0.96 0.95 1.00 0.92  CALCIUM 8.6* 8.6* 9.1 8.9 8.7*  MG 1.8 1.8 2.0 2.0 2.0  AST 34 27 36 35 36  ALT 29 22 22 19 18   ALKPHOS 264* 233* 284* 268* 253*  BILITOT 3.0* 2.6* 2.8* 2.6* 2.3*   ------------------------------------------------------------------------------------------------------------------ No results for input(s): CHOL, HDL, LDLCALC, TRIG, CHOLHDL, LDLDIRECT in the last 72 hours.  Lab Results  Component Value Date   HGBA1C 6.1 (H) 07/21/2019   ------------------------------------------------------------------------------------------------------------------ No  results for input(s): TSH, T4TOTAL, T3FREE, THYROIDAB in the last 72 hours.  Invalid input(s): FREET3 ------------------------------------------------------------------------------------------------------------------ No results for input(s): VITAMINB12, FOLATE, FERRITIN, TIBC, IRON, RETICCTPCT in the last 72 hours.  Coagulation profile No results for input(s): INR, PROTIME in the last 168 hours.  Recent Labs    07/25/19 0858 07/26/19 0350  DDIMER 3.26* 3.03*    Cardiac Enzymes No results for input(s): CKMB, TROPONINI, MYOGLOBIN in the last 168 hours.  Invalid input(s): CK ------------------------------------------------------------------------------------------------------------------    Component Value Date/Time   BNP 1,142.6 (H) 07/21/2019 1316    Inpatient Medications  Scheduled Meds: . aspirin EC  81 mg Oral Daily  . docusate sodium  100 mg Oral BID  . doxycycline  100 mg Oral Q12H  . enoxaparin (LOVENOX) injection  65 mg Subcutaneous Q24H  . feeding supplement (ENSURE ENLIVE)  237 mL Oral Q1500  . feeding supplement (PRO-STAT SUGAR FREE 64)  30 mL Oral Daily  . furosemide  40 mg Intravenous Q12H  . levothyroxine  25 mcg Oral Q0600  .  saccharomyces boulardii  250 mg Oral BID  . sodium chloride flush  10-40 mL Intracatheter Q12H  . sodium chloride flush  3 mL Intravenous Q12H   Continuous Infusions: . sodium chloride     PRN Meds:.sodium chloride, acetaminophen **OR** acetaminophen, bisacodyl, hydrALAZINE, HYDROcodone-acetaminophen, ondansetron **OR** ondansetron (ZOFRAN) IV, polyethylene glycol, sodium chloride flush, sodium chloride flush  Micro Results Recent Results (from the past 240 hour(s))  Culture, blood (routine x 2)     Status: None   Collection Time: 07/21/19 12:37 PM   Specimen: BLOOD RIGHT HAND  Result Value Ref Range Status   Specimen Description BLOOD RIGHT HAND  Final   Special Requests   Final    BOTTLES DRAWN AEROBIC ONLY Blood Culture  results may not be optimal due to an inadequate volume of blood received in culture bottles   Culture   Final    NO GROWTH 5 DAYS Performed at [facility], [place] [address], [place], [place] [address]    Report Status 07/26/2019 FINAL  Final  SARS Coronavirus 2 by RT PCR (hospital order, performed in [facility] hospital lab) Nasopharyngeal Nasopharyngeal Swab     Status: Abnormal   Collection Time: 07/21/19 12:37 PM   Specimen: Nasopharyngeal Swab  Result Value Ref Range Status   SARS Coronavirus 2 POSITIVE (A) NEGATIVE Final    Comment: RESULT CALLED TO, READ BACK BY AND VERIFIED WITH: Angelina Ok RN 16:45 07/21/19 ([email or web address]) (NOTE) SARS-CoV-2 target nucleic acids are DETECTED SARS-CoV-2 RNA is generally detectable in upper respiratory specimens  during the acute phase of infection.  Positive results are indicative  of the presence of the identified virus, but do not rule out bacterial infection or co-infection with other pathogens not detected by the test.  Clinical correlation with patient history and  other diagnostic information is necessary to determine patient infection status.  The expected result is negative. Fact Sheet for Patients:   StrictlyIdeas.no  Fact Sheet for Healthcare Providers:   BankingDealers.co.za   This test is not yet approved or cleared by the [place] FDA and  has been authorized for detection and/or diagnosis of SARS-CoV-2 by FDA under an Emergency Use Authorization (EUA).  This EUA will remain in effect (meaning this  test can be used) for the duration of  the COVID-19 declaration under Section 564(b)(1) of the Act, 21 U.S.C. section 360-bbb-3(b)(1), unless the authorization is terminated or revoked sooner. Performed at [facility], [place] [address], [place], [place] [address]   Culture, blood (routine x 2)     Status: None   Collection Time: 07/21/19 12:38 PM   Specimen: BLOOD    Result Value Ref Range Status   Specimen Description BLOOD RIGHT ANTECUBITAL  Final   Special Requests   Final    BOTTLES DRAWN AEROBIC AND ANAEROBIC Blood Culture results may not be optimal due to an inadequate volume of blood received in culture bottles   Culture   Final    NO GROWTH 5 DAYS Performed at [facility], [place] [address], [place], [place] [address]    Report Status 07/26/2019 FINAL  Final    Radiology Reports DG Chest 2 View  Result Date: 07/21/2019 CLINICAL DATA:  Chest pain and shortness of breath EXAM: CHEST - 2 VIEW COMPARISON:  06/25/2018 FINDINGS: Congested appearance of vessels with small pleural effusions. Cardiomegaly that is chronic. Negative aortic contours. IMPRESSION: Cardiomegaly with vascular congestion and small pleural effusions. Electronically Signed   By: Angelica Chessman  Watts M.D.   On: 07/21/2019 08:42   CT CORONARY MORPH W/CTA COR W/SCORE W/CA W/CM &/OR WO/CM  Result Date: 07/26/2019 CLINICAL DATA:  54 year old male with severe aortic stenosis being evaluated for a TAVR procedure. EXAM: Cardiac TAVR CT TECHNIQUE: The patient was scanned on a [employer]. A 120 kV retrospective scan was triggered in the descending thoracic aorta at 111 HU's. Gantry rotation speed was 250 msecs and collimation was .6 mm. No beta blockade or nitro were given. The 3D data set was reconstructed in 5% intervals of the R-R cycle. Systolic and diastolic phases were analyzed on a dedicated work station using MPR, MIP and VRT modes. The patient received 80 cc of contrast. FINDINGS: Aortic Valve: Trileaflet aortic valve with severely calcified and thickened leaflets and only mild calcifications extending into the LVOT. Aorta: Normal size, minimal plaque, no calcifications, no dissection. Sinotubular Junction: 30 x 29 mm Ascending Thoracic Aorta: 35 x 34 mm Aortic Arch: Not visualized. Descending Thoracic Aorta: 22 x 22 mm Sinus of Valsalva Measurements: Non-coronary: 34 mm Right  -coronary: 31 mm Left -coronary: 35 mm Coronary Artery Height above Annulus: Left Main: 13.1 mm Right Coronary: 18.2 mm Virtual Basal Annulus Measurements: Maximum/Minimum Diameter: 34.0 x 25.7 mm Mean Diameter: 28.2 mm Perimeter: 94.1 mm Area: 626 mm2 Coronary Arteries: Calcium score is 0. Coronary Arteries:  Normal coronary origin.  Right dominance. RCA is a large dominant artery that gives rise to PDA and PLA. There is no plaque. Left main is a large artery that gives rise to LAD and LCX arteries. Left main has no plaque. LAD is a large vessel that has no plaque. LCX is a non-dominant artery that gives rise to one large OM1 branch. There is no plaque. Optimum Fluoroscopic Angle for Delivery: RAO 0 CRA 0 IMPRESSION: 1. Trileaflet aortic valve with severely calcified and thickened leaflets and only mild calcifications extending into the LVOT. Aortic valve calcium score 7968 consistent with severe aortic stenosis. Annular measurements suitable for delivery of a 29 mm Edwards-SAPIEN 3 valve. 2. Sufficient coronary to annulus distance. 3. Optimum Fluoroscopic Angle for Delivery:  RAO 0 CRA 0. 4. No thrombus in the left atrial appendage. 5. Coronary Arteries: Normal origin. Right dominance. Calcium score is 0. There are only minimal irregularities. 6. Dilated pulmonary artery measuring 35 suggestive of pulmonary hypertension. Electronically Signed   By: Ena Dawley   On: 07/26/2019 13:01   ECHOCARDIOGRAM COMPLETE  Result Date: 07/21/2019    ECHOCARDIOGRAM REPORT   Patient Name:   DARNELLE CORP Date of Exam: 07/21/2019 Medical Rec #:  [id number]   Height:       72.0 in Accession #:    [id number]  Weight:       310.0 lb Date of Birth:  1965-05-07   BSA:          2.567 m Patient Age:    42 years    BP:           102/73 mmHg Patient Gender: M           HR:           73 bpm. Exam Location:  Inpatient Procedure: 2D Echo Indications:    CHF-Acute Systolic 749.44 / H67.59  History:        Patient has no prior history of  Echocardiogram examinations.                 Risk Factors:Obesity. Pleural effusion on right.  Sonographer:  Chelsea Turrentine Referring Phys: [place]  1. Left ventricular ejection fraction, by estimation, is 40 to 45%. The left ventricle has mildly decreased function. The left ventricle demonstrates global hypokinesis. There is moderate left ventricular hypertrophy. Left ventricular diastolic parameters are consistent with Grade II diastolic dysfunction (pseudonormalization).  2. Right ventricular systolic function is mildly reduced. The right ventricular size is normal. There is moderately elevated pulmonary artery systolic pressure.  3. Left atrial size was moderately dilated.  4. The mitral valve is abnormal. Trivial mitral valve regurgitation.  5. Cannot exclude a bicuspid valve. Aortic valve regurgitation is mild. Critical aortic valve stenosis. Aortic valve area, by VTI measures 0.73 cm. Aortic valve mean gradient measures 96.2 mmHg. Peak gradient of 137 mmHg. Aortic valve Vmax measures 5.86 m/s.  6. The inferior vena cava is dilated in size with <50% respiratory variability, suggesting right atrial pressure of 15 mmHg.  7. Moderate pleural effusion in the right lateral region. FINDINGS  Left Ventricle: Left ventricular ejection fraction, by estimation, is 40 to 45%. The left ventricle has mildly decreased function. The left ventricle demonstrates global hypokinesis. The left ventricular internal cavity size was normal in size. There is  moderate left ventricular hypertrophy. Left ventricular diastolic parameters are consistent with Grade II diastolic dysfunction (pseudonormalization). Indeterminate filling pressures. Right Ventricle: The right ventricular size is normal. No increase in right ventricular wall thickness. Right ventricular systolic function is mildly reduced. There is moderately elevated pulmonary artery systolic pressure. The tricuspid regurgitant velocity is 3.35  m/s, and with an assumed right atrial pressure of 15 mmHg, the estimated right ventricular systolic pressure is 05.3 mmHg. Left Atrium: Left atrial size was moderately dilated. Right Atrium: Right atrial size was normal in size. Pericardium: There is no evidence of pericardial effusion. Mitral Valve: The mitral valve is abnormal. Mild to moderate mitral annular calcification. Trivial mitral valve regurgitation. Tricuspid Valve: The tricuspid valve is grossly normal. Tricuspid valve regurgitation is mild. Aortic Valve: The aortic valve is tricuspid. . There is moderate thickening and severe calcifcation of the aortic valve. Aortic valve regurgitation is mild. Aortic regurgitation PHT measures 374 msec. Severe aortic stenosis is present. There is moderate thickening of the aortic valve. There is severe calcifcation of the aortic valve. Aortic valve mean gradient measures 96.2 mmHg. Aortic valve peak gradient measures 137.3 mmHg. Aortic valve area, by VTI measures 0.73 cm. Pulmonic Valve: The pulmonic valve was normal in structure. Pulmonic valve regurgitation is not visualized. Aorta: The aortic root and ascending aorta are structurally normal, with no evidence of dilitation. Venous: The inferior vena cava is dilated in size with less than 50% respiratory variability, suggesting right atrial pressure of 15 mmHg. IAS/Shunts: No atrial level shunt detected by color flow Doppler. Additional Comments: There is a moderate pleural effusion in the right lateral region.  LEFT VENTRICLE PLAX 2D LVIDd:         5.50 cm  Diastology LVIDs:         4.40 cm  LV e' lateral:   9.23 cm/s LV PW:         1.40 cm  LV E/e' lateral: 10.2 LV IVS:        1.40 cm  LV e' medial:    7.05 cm/s LVOT diam:     2.50 cm  LV E/e' medial:  13.4 LV SV:         108 LV SV Index:   42 LVOT Area:     4.91 cm  RIGHT  VENTRICLE RV S prime:     7.20 cm/s TAPSE (M-mode): 1.2 cm LEFT ATRIUM              Index       RIGHT ATRIUM           Index LA diam:         5.90 cm  2.30 cm/m  RA Area:     22.60 cm LA Vol (A2C):   121.0 ml 47.14 ml/m RA Volume:   68.80 ml  26.80 ml/m LA Vol (A4C):   95.5 ml  37.21 ml/m LA Biplane Vol: 110.0 ml 42.86 ml/m  AORTIC VALVE AV Area (Vmax):    0.82 cm AV Area (Vmean):   0.72 cm AV Area (VTI):     0.73 cm AV Vmax:           585.80 cm/s AV Vmean:          474.000 cm/s AV VTI:            1.480 m AV Peak Grad:      137.3 mmHg AV Mean Grad:      96.2 mmHg LVOT Vmax:         97.30 cm/s LVOT Vmean:        69.800 cm/s LVOT VTI:          0.220 m LVOT/AV VTI ratio: 0.15 AI PHT:            374 msec  AORTA Ao Root diam: 3.00 cm MITRAL VALVE               TRICUSPID VALVE MV Area (PHT): 5.54 cm    TR Peak grad:   44.9 mmHg MV Decel Time: 137 msec    TR Vmax:        335.00 cm/s MV E velocity: 94.30 cm/s MV A velocity: 46.30 cm/s  SHUNTS MV E/A ratio:  2.04        Systemic VTI:  0.22 m                            Systemic Diam: 2.50 cm Lyman Bishop MD Electronically signed by Lyman Bishop MD Signature Date/Time: 07/21/2019/3:42:23 PM    Final    VAS US CAROTID  Result Date: 07/25/2019 Carotid Arterial Duplex Study Indications:       Covid-19. Other Factors:     Critical Aortic Stenosis, CHF, Morbid Obesity. Limitations        Today's exam was limited due to the body habitus of the                    patient. Comparison Study:  No prior study on file for comparison Performing Technologist: Sharion Dove RVS  Examination Guidelines: A complete evaluation includes B-mode imaging, spectral Doppler, color Doppler, and power Doppler as needed of all accessible portions of each vessel. Bilateral testing is considered an integral part of a complete examination. Limited examinations for reoccurring indications may be performed as noted.  Right Carotid Findings: +----------+--------+--------+--------+------------------+------------------+           PSV cm/sEDV cm/sStenosisPlaque DescriptionComments            +----------+--------+--------+--------+------------------+------------------+ CCA Prox  41      7                                 intimal thickening +----------+--------+--------+--------+------------------+------------------+ CCA Distal51  12                                intimal thickening +----------+--------+--------+--------+------------------+------------------+ ICA Prox  42      12              heterogenous                         +----------+--------+--------+--------+------------------+------------------+ ICA Distal48      15                                                   +----------+--------+--------+--------+------------------+------------------+ ECA       62      10                                                   +----------+--------+--------+--------+------------------+------------------+ +----------+--------+-------+--------+-------------------+           PSV cm/sEDV cmsDescribeArm Pressure (mmHG) +----------+--------+-------+--------+-------------------+ [id number]                                         +----------+--------+-------+--------+-------------------+ +---------+--------+--+--------+-+ VertebralPSV cm/s28EDV cm/s9 +---------+--------+--+--------+-+  Left Carotid Findings: +----------+--------+--------+--------+------------------+------------------+           PSV cm/sEDV cm/sStenosisPlaque DescriptionComments           +----------+--------+--------+--------+------------------+------------------+ CCA Prox  73      12                                intimal thickening +----------+--------+--------+--------+------------------+------------------+ CCA Distal43      9                                 intimal thickening +----------+--------+--------+--------+------------------+------------------+ ICA Prox  54      11                                                    +----------+--------+--------+--------+------------------+------------------+ ICA Distal67      19                                                   +----------+--------+--------+--------+------------------+------------------+ ECA       53      5                                                    +----------+--------+--------+--------+------------------+------------------+ +----------+--------+--------+--------+-------------------+           PSV cm/sEDV cm/sDescribeArm Pressure (mmHG) +----------+--------+--------+--------+-------------------+ Subclavian113                                         +----------+--------+--------+--------+-------------------+ +---------+--------+--+--------+--+  VertebralPSV cm/s75EDV cm/s20 +---------+--------+--+--------+--+   Summary: Right Carotid: The extracranial vessels were near-normal with only minimal wall                thickening or plaque. Left Carotid: The extracranial vessels were near-normal with only minimal wall               thickening or plaque. Vertebrals:  Bilateral vertebral arteries demonstrate antegrade flow. Subclavians: Normal flow hemodynamics were seen in bilateral subclavian              arteries. *See table(s) above for measurements and observations.     Preliminary    VAS [place] LOWER EXTREMITY VENOUS (DVT)  Result Date: 07/23/2019  Lower Venous DVTStudy Indications: Edema, and elevated ddimer.  Limitations: Body habitus and poor ultrasound/tissue interface. Comparison Study: no prior Performing Technologist: Abram Sander RVS  Examination Guidelines: A complete evaluation includes B-mode imaging, spectral Doppler, color Doppler, and power Doppler as needed of all accessible portions of each vessel. Bilateral testing is considered an integral part of a complete examination. Limited examinations for reoccurring indications may be performed as noted. The reflux portion of the exam is performed with the patient in reverse  Trendelenburg.  +---------+---------------+---------+-----------+----------+--------------+ RIGHT    CompressibilityPhasicitySpontaneityPropertiesThrombus Aging +---------+---------------+---------+-----------+----------+--------------+ CFV      Full           Yes      Yes                                 +---------+---------------+---------+-----------+----------+--------------+ SFJ      Full                                                        +---------+---------------+---------+-----------+----------+--------------+ FV Prox  Full                                                        +---------+---------------+---------+-----------+----------+--------------+ FV Mid                  Yes      Yes                                 +---------+---------------+---------+-----------+----------+--------------+ FV Distal               Yes      Yes                                 +---------+---------------+---------+-----------+----------+--------------+ PFV      Full                                                        +---------+---------------+---------+-----------+----------+--------------+ POP      Full           Yes      Yes                                 +---------+---------------+---------+-----------+----------+--------------+  PTV      Full                                                        +---------+---------------+---------+-----------+----------+--------------+ PERO     Full                                                        +---------+---------------+---------+-----------+----------+--------------+   +---------+---------------+---------+-----------+----------+--------------+ LEFT     CompressibilityPhasicitySpontaneityPropertiesThrombus Aging +---------+---------------+---------+-----------+----------+--------------+ CFV      Full           Yes      Yes                                  +---------+---------------+---------+-----------+----------+--------------+ SFJ      Full                                                        +---------+---------------+---------+-----------+----------+--------------+ FV Prox  Full                                                        +---------+---------------+---------+-----------+----------+--------------+ FV Mid   Full                                                        +---------+---------------+---------+-----------+----------+--------------+ FV Distal               Yes      Yes                                 +---------+---------------+---------+-----------+----------+--------------+ PFV      Full                                                        +---------+---------------+---------+-----------+----------+--------------+ POP      Full           Yes      Yes                                 +---------+---------------+---------+-----------+----------+--------------+ PTV      Full                                                        +---------+---------------+---------+-----------+----------+--------------+  PERO                                                  Not visualized +---------+---------------+---------+-----------+----------+--------------+     Summary: BILATERAL: - No evidence of deep vein thrombosis seen in the lower extremities, bilaterally. -   *See table(s) above for measurements and observations. Electronically signed by Monica Martinez MD on 07/23/2019 at 5:31:24 PM.    Final     Phillips Climes M.D on 07/26/2019 at 2:49 PM  Between 7am to 7pm - Pager - [phone]  After 7pm go to [email or web address] - password [facility]  [facility] -  Office  [phone]                                          PROGRESS NOTE                                                                                                                                                                                                              Patient Demographics:    Steve Richards, is a 54 y.o. male, DOB - 1965/10/29, JSE:[id number]  Admit date - 07/21/2019   Admitting Physician Karmen Bongo, MD  Outpatient Primary MD for the patient is System, Pcp Not In  LOS - 5   Chief Complaint  Patient presents with  . Shortness of Breath  . Leg Swelling       Brief Narrative     Steve Richards is a 54 y.o. male with medical history significant of R pleural effusion in April 2021 presenting with SOB and LE edema.    Patient did not follow with PCP for last 6 years, patient with recent work-up as an outpatient and negative for CHF, volume overload with thoracentesis status post drain, as well evidence of liver cirrhosis, and recent diagnosis of left lower extremity cellulitis where he was started on Keflex, ED secondary to dyspnea, evidence of volume overload, as well he tested Covid positive on admission .   Subjective:    Steve Richards today has, No headache, No chest pain, No abdominal pain, overall report is feeling better, dyspnea has improved as well.   Assessment  & Plan :    Principal Problem:   New onset of congestive heart failure (HCC) Active Problems:  Hepatic cirrhosis (HCC)   Pleural effusion on right   Systolic ejection murmur   Cellulitis and abscess of left lower extremity   Hyperglycemia   Obesity, Class III, BMI 40-49.9 (morbid obesity) (HCC)   Acute systolic/diastolic CHF -Did not follow with his PCP for last 6 years, with poor outpatient follow-up. -Evidence of volume overload on imaging, recurrent pleural effusion, elevated BNP and lower extremity edema. -Continue with IV diuresis, he appears to be tolerating 40 mg of IV Lasix twice daily, so far -10 L since hospital stay, blood pressure on the lower side, but he is asymptomatic, will give hour-long [place] blood pressure more than 90 . -2D echo significant for low EF 40 to 45%, with grade 2 diastolic CHF,  cardiology has been consulted regarding further recommendations. -Blood pressure is persistently on the lower side, there is no room to add any beta-blockers or ACEi/ARB  Severe aortic stenosis -Management per cardiology . -likely contributing to multiple comorbidities, possibly causing cardiac cirrhosis, his lower extremity edema contributing to venous stasis and cellulitis. -Structural heart team input greatly appreciated, plan for imaging this weekend, and cardiac cath on Tuesday.   COVID-19 infection -Patient denies any fever, cough currently, no evidence of pneumonia on imaging, he has no hypoxia, so for now no indication for steroids and remdesivir, but will continue to monitor closely especially with significantly elevated inflammatory markers. -D-dimers elevated at 6 on admission, it is contrary it is currently trending down, tinea with current DVT prophylaxis dose, his venous Doppler negative for DVT .  COVID-19 Labs  Recent Labs    07/24/19 1237 07/25/19 0858 07/26/19 0350  DDIMER 3.59* 3.26* 3.03*  CRP 4.9* 3.5* 3.0*    Lab Results  Component Value Date   SARSCOV2NAA POSITIVE (A) 07/21/2019   [place] NEGATIVE 06/23/2019   Liver cirrhosis -Patient denies any history of alcohol abuse in the past, this is most likely related to cardiac cirrhosis versus NASH. -Right upper quadrant ultrasound. -We will DC Aldactone given soft blood pressure -Check acute hepatitis panel  Left lower extremity cellulitis -He appears to be having left lower extremity cellulitis, which did not respond to Keflex, as well there is evidence of venous stasis disease contributing to his infection, as well significant lower extremity edema as well. -Initially on IV vancomycin, patient IV cefazolin, I will change doxycycline.  Hyperglycemia -A1c is 6.1  Morbid obesity  Hypothyroidism -TSH elevated at 14, with low normal free T4 at 0.65, started on low-dose Synthroid   Code Status :  Full  Disposition Plan  :  Status is: Inpatient  Remains inpatient appropriate because:IV treatments appropriate due to intensity of illness or inability to take PO   Dispo: The patient is from: Home              Anticipated d/c is to: Home              Anticipated d/c date is: 3 days              Patient currently is not medically stable to d/c.  Patient still with significant volume overload, requiring further IV diuresis.       Consults  :  Cardiology  Procedures  : None  DVT Prophylaxis  :  [place] lovenox  Lab Results  Component Value Date   PLT 197 07/26/2019    Antibiotics  :   Anti-infectives (From admission, onward)   Start     Dose/Rate Route Frequency Ordered Stop   07/25/19 1530  doxycycline (VIBRA-TABS) tablet 100 mg     100 mg Oral Every 12 hours 07/25/19 1521     07/22/19 1400  ceFAZolin (ANCEF) IVPB 2g/100 mL premix  Status:  Discontinued     2 g 200 mL/hr over 30 Minutes Intravenous Every 8 hours 07/22/19 1223 07/25/19 1521   07/22/19 0000  vancomycin (VANCOREADY) IVPB 1250 mg/250 mL  Status:  Discontinued     1,250 mg 166.7 mL/hr over 90 Minutes Intravenous Every 12 hours 07/21/19 1141 07/22/19 1223   07/21/19 1145  vancomycin (VANCOREADY) IVPB 2000 mg/400 mL     2,000 mg 200 mL/hr over 120 Minutes Intravenous  Once 07/21/19 1141 07/21/19 1509        Objective:   Vitals:   07/25/19 1940 07/26/19 0355 07/26/19 0357 07/26/19 1408  BP: 108/87 91/64  92/73  Pulse: 88 76    Resp: 20 20  17   Temp: 97.6 F (36.4 C) (!) 97.5 F (36.4 C)  97.9 F (36.6 C)  TempSrc: Oral Oral  Oral  SpO2: 96% 100%  99%  Weight:   125.8 kg   Height:        Wt Readings from Last 3 Encounters:  07/26/19 125.8 kg  09/24/11 125.2 kg     Intake/Output Summary (Last 24 hours) at 07/26/2019 1450 Last data filed at 07/26/2019 1408 Gross per 24 hour  Intake 650.66 ml  Output 4725 ml  Net -4074.34 ml     Physical Exam  Awake Alert, Oriented X 3, No new F.N  deficits, Normal affect Symmetrical Chest wall movement, Good air movement bilaterally, CTAB RRR,No Gallops,Rubs ,+ Murmurs, No Parasternal Heave +ve B.Sounds, Abd Soft, No tenderness, No rebound - guarding or rigidity. No Cyanosis, Clubbing, remains with lower extremity edema, appears to be improving, lower extremity with Ace wrap        Data Review:    CBC Recent Labs  Lab 07/22/19 0823 07/23/19 0622 07/24/19 1237 07/25/19 0858 07/26/19 0350  WBC 7.8 7.3 7.9 6.9 7.9  HGB 15.2 13.6 14.6 13.7 13.6  HCT 46.9 42.5 45.8 42.9 43.0  PLT 222 197 215 193 197  MCV 93.8 93.6 92.9 93.9 93.5  MCH 30.4 30.0 29.6 30.0 29.6  MCHC 32.4 32.0 31.9 31.9 31.6  RDW 16.5* 16.4* 16.5* 16.5* 16.8*  LYMPHSABS 0.9 0.9 0.9 0.9 1.0  MONOABS 0.6 0.6 0.5 0.4 0.5  EOSABS 0.1 0.1 0.1 0.1 0.1  BASOSABS 0.0 0.1 0.0 0.0 0.0    Chemistries  Recent Labs  Lab 07/22/19 0823 07/23/19 0622 07/24/19 1237 07/25/19 0858 07/26/19 0350  NA 139 138 139 138 136  K 3.7 3.9 4.1 4.2 3.6  CL 99 96* 93* 93* 94*  CO2 30 33* 34* 34* 35*  GLUCOSE 102* 112* 116* 150* 119*  BUN 13 16 18 16 16   CREATININE 0.88 0.96 0.95 1.00 0.92  CALCIUM 8.6* 8.6* 9.1 8.9 8.7*  MG 1.8 1.8 2.0 2.0 2.0  AST 34 27 36 35 36  ALT 29 22 22 19 18   ALKPHOS 264* 233* 284* 268* 253*  BILITOT 3.0* 2.6* 2.8* 2.6* 2.3*   ------------------------------------------------------------------------------------------------------------------ No results for input(s): CHOL, HDL, LDLCALC, TRIG, CHOLHDL, LDLDIRECT in the last 72 hours.  Lab Results  Component Value Date   HGBA1C 6.1 (H) 07/21/2019   ------------------------------------------------------------------------------------------------------------------ No results for input(s): TSH, T4TOTAL, T3FREE, THYROIDAB in the last 72 hours.  Invalid input(s): FREET3 ------------------------------------------------------------------------------------------------------------------ No results for  input(s): VITAMINB12, FOLATE, FERRITIN, TIBC, IRON, RETICCTPCT in the last 72 hours.  Coagulation profile No results for input(s): INR, PROTIME in the last 168 hours.  Recent Labs    07/25/19 0858 07/26/19 0350  DDIMER 3.26* 3.03*    Cardiac Enzymes No results for input(s): CKMB, TROPONINI, MYOGLOBIN in the last 168 hours.  Invalid input(s): CK ------------------------------------------------------------------------------------------------------------------    Component Value Date/Time   BNP 1,142.6 (H) 07/21/2019 1316    Inpatient Medications  Scheduled Meds: . aspirin EC  81 mg Oral Daily  . docusate sodium  100 mg Oral BID  . doxycycline  100 mg Oral Q12H  . enoxaparin (LOVENOX) injection  65 mg Subcutaneous Q24H  . feeding supplement (ENSURE ENLIVE)  237 mL Oral Q1500  . feeding supplement (PRO-STAT SUGAR FREE 64)  30 mL Oral Daily  . furosemide  40 mg Intravenous Q12H  . levothyroxine  25 mcg Oral Q0600  . saccharomyces boulardii  250 mg Oral BID  . sodium chloride flush  10-40 mL Intracatheter Q12H  . sodium chloride flush  3 mL Intravenous Q12H   Continuous Infusions: . sodium chloride     PRN Meds:.sodium chloride, acetaminophen **OR** acetaminophen, bisacodyl, hydrALAZINE, HYDROcodone-acetaminophen, ondansetron **OR** ondansetron (ZOFRAN) IV, polyethylene glycol, sodium chloride flush, sodium chloride flush  Micro Results Recent Results (from the past 240 hour(s))  Culture, blood (routine x 2)     Status: None   Collection Time: 07/21/19 12:37 PM   Specimen: BLOOD RIGHT HAND  Result Value Ref Range Status   Specimen Description BLOOD RIGHT HAND  Final   Special Requests   Final    BOTTLES DRAWN AEROBIC ONLY Blood Culture results may not be optimal due to an inadequate volume of blood received in culture bottles   Culture   Final    NO GROWTH 5 DAYS Performed at [facility], [place] [address], [place], [place] [address]    Report Status 07/26/2019  FINAL  Final  SARS Coronavirus 2 by RT PCR (hospital order, performed in [facility] hospital lab) Nasopharyngeal Nasopharyngeal Swab     Status: Abnormal   Collection Time: 07/21/19 12:37 PM   Specimen: Nasopharyngeal Swab  Result Value Ref Range Status   SARS Coronavirus 2 POSITIVE (A) NEGATIVE Final    Comment: RESULT CALLED TO, READ BACK BY AND VERIFIED WITH: Angelina Ok RN 16:45 07/21/19 ([email or web address]) (NOTE) SARS-CoV-2 target nucleic acids are DETECTED SARS-CoV-2 RNA is generally detectable in upper respiratory specimens  during the acute phase of infection.  Positive results are indicative  of the presence of the identified virus, but do not rule out bacterial infection or co-infection with other pathogens not detected by the test.  Clinical correlation with patient history and  other diagnostic information is necessary to determine patient infection status.  The expected result is negative. Fact Sheet for Patients:   StrictlyIdeas.no  Fact Sheet for Healthcare Providers:   BankingDealers.co.za   This test is not yet approved or cleared by the [place] FDA and  has been authorized for detection and/or diagnosis of SARS-CoV-2 by FDA under an Emergency Use Authorization (EUA).  This EUA will remain in effect (meaning this  test can be used) for the duration of  the COVID-19 declaration under Section 564(b)(1) of the Act, 21 U.S.C. section 360-bbb-3(b)(1), unless the authorization is terminated or revoked sooner. Performed at [facility], [place] [address] [place], [place] [address]   Culture, blood (routine x 2)     Status: None   Collection Time: 07/21/19 12:38 PM   Specimen: BLOOD  Result Value Ref Range Status   Specimen Description BLOOD RIGHT ANTECUBITAL  Final   Special Requests   Final    BOTTLES DRAWN AEROBIC AND ANAEROBIC Blood Culture results may not be optimal due to an inadequate volume of blood received in  culture bottles   Culture   Final    NO GROWTH 5 DAYS Performed at [facility] [address] [place], [place] [address]    Report Status 07/26/2019 FINAL  Final    Radiology Reports DG Chest 2 View  Result Date: 07/21/2019 CLINICAL DATA:  Chest pain and shortness of breath EXAM: CHEST - 2 VIEW COMPARISON:  06/25/2018 FINDINGS: Congested appearance of vessels with small pleural effusions. Cardiomegaly that is chronic. Negative aortic contours. IMPRESSION: Cardiomegaly with vascular congestion and small pleural effusions. Electronically Signed   By: Monte Fantasia M.D.   On: 07/21/2019 08:42   CT CORONARY MORPH W/CTA COR W/SCORE W/CA W/CM &/OR WO/CM  Result Date: 07/26/2019 CLINICAL DATA:  54 year old male with severe aortic stenosis being evaluated for a TAVR procedure. EXAM: Cardiac TAVR CT TECHNIQUE: The patient was scanned on a [employer]. A 120 kV retrospective scan was triggered in the descending thoracic aorta at 111 HU's. Gantry rotation speed was 250 msecs and collimation was .6 mm. No beta blockade or nitro were given. The 3D data set was reconstructed in 5% intervals of the R-R cycle. Systolic and diastolic phases were analyzed on a dedicated work station using MPR, MIP and VRT modes. The patient received 80 cc of contrast. FINDINGS: Aortic Valve: Trileaflet aortic valve with severely calcified and thickened leaflets and only mild calcifications extending into the LVOT. Aorta: Normal size, minimal plaque, no calcifications, no dissection. Sinotubular Junction: 30 x 29 mm Ascending Thoracic Aorta: 35 x 34 mm Aortic Arch: Not visualized. Descending Thoracic Aorta: 22 x 22 mm Sinus of Valsalva Measurements: Non-coronary: 34 mm Right -coronary: 31 mm Left -coronary: 35 mm Coronary Artery Height above Annulus: Left Main: 13.1 mm Right Coronary: 18.2 mm Virtual Basal Annulus Measurements: Maximum/Minimum Diameter: 34.0 x 25.7 mm Mean Diameter: 28.2 mm Perimeter: 94.1 mm Area:  626 mm2 Coronary Arteries: Calcium score is 0. Coronary Arteries:  Normal coronary origin.  Right dominance. RCA is a large dominant artery that gives rise to PDA and PLA. There is no plaque. Left main is a large artery that gives rise to LAD and LCX arteries. Left main has no plaque. LAD is a large vessel that has no plaque. LCX is a non-dominant artery that gives rise to one large OM1 branch. There is no plaque. Optimum Fluoroscopic Angle for Delivery: RAO 0 CRA 0 IMPRESSION: 1. Trileaflet aortic valve with severely calcified and thickened leaflets and only mild calcifications extending into the LVOT. Aortic valve calcium score 7968 consistent with severe aortic stenosis. Annular measurements suitable for delivery of a 29 mm Edwards-SAPIEN 3 valve. 2. Sufficient coronary to annulus distance. 3. Optimum Fluoroscopic Angle for Delivery:  RAO 0 CRA 0. 4. No thrombus in the left atrial appendage. 5. Coronary Arteries: Normal origin. Right dominance. Calcium score is 0. There are only minimal irregularities. 6. Dilated pulmonary artery measuring 35 suggestive of pulmonary hypertension. Electronically Signed   By: Ena Dawley   On: 07/26/2019 13:01   ECHOCARDIOGRAM COMPLETE  Result Date: 07/21/2019    ECHOCARDIOGRAM REPORT   Patient Name:   Steve Richards Date of Exam: 07/21/2019 Medical Rec #:  [id number]   Height:       72.0  in Accession #:    [id number]  Weight:       310.0 lb Date of Birth:  [birth year]   BSA:          2.567 m Patient Age:    43 years    BP:           102/73 mmHg Patient Gender: M           HR:           73 bpm. Exam Location:  Inpatient Procedure: 2D Echo Indications:    CHF-Acute Systolic 829.93 / Z16.96  History:        Patient has no prior history of Echocardiogram examinations.                 Risk Factors:Obesity. Pleural effusion on right.  Sonographer:    Vikki Ports Turrentine Referring Phys: [place]  1. Left ventricular ejection fraction, by estimation, is 40 to 45%.  The left ventricle has mildly decreased function. The left ventricle demonstrates global hypokinesis. There is moderate left ventricular hypertrophy. Left ventricular diastolic parameters are consistent with Grade II diastolic dysfunction (pseudonormalization).  2. Right ventricular systolic function is mildly reduced. The right ventricular size is normal. There is moderately elevated pulmonary artery systolic pressure.  3. Left atrial size was moderately dilated.  4. The mitral valve is abnormal. Trivial mitral valve regurgitation.  5. Cannot exclude a bicuspid valve. Aortic valve regurgitation is mild. Critical aortic valve stenosis. Aortic valve area, by VTI measures 0.73 cm. Aortic valve mean gradient measures 96.2 mmHg. Peak gradient of 137 mmHg. Aortic valve Vmax measures 5.86 m/s.  6. The inferior vena cava is dilated in size with <50% respiratory variability, suggesting right atrial pressure of 15 mmHg.  7. Moderate pleural effusion in the right lateral region. FINDINGS  Left Ventricle: Left ventricular ejection fraction, by estimation, is 40 to 45%. The left ventricle has mildly decreased function. The left ventricle demonstrates global hypokinesis. The left ventricular internal cavity size was normal in size. There is  moderate left ventricular hypertrophy. Left ventricular diastolic parameters are consistent with Grade II diastolic dysfunction (pseudonormalization). Indeterminate filling pressures. Right Ventricle: The right ventricular size is normal. No increase in right ventricular wall thickness. Right ventricular systolic function is mildly reduced. There is moderately elevated pulmonary artery systolic pressure. The tricuspid regurgitant velocity is 3.35 m/s, and with an assumed right atrial pressure of 15 mmHg, the estimated right ventricular systolic pressure is 78.9 mmHg. Left Atrium: Left atrial size was moderately dilated. Right Atrium: Right atrial size was normal in size. Pericardium: There  is no evidence of pericardial effusion. Mitral Valve: The mitral valve is abnormal. Mild to moderate mitral annular calcification. Trivial mitral valve regurgitation. Tricuspid Valve: The tricuspid valve is grossly normal. Tricuspid valve regurgitation is mild. Aortic Valve: The aortic valve is tricuspid. . There is moderate thickening and severe calcifcation of the aortic valve. Aortic valve regurgitation is mild. Aortic regurgitation PHT measures 374 msec. Severe aortic stenosis is present. There is moderate thickening of the aortic valve. There is severe calcifcation of the aortic valve. Aortic valve mean gradient measures 96.2 mmHg. Aortic valve peak gradient measures 137.3 mmHg. Aortic valve area, by VTI measures 0.73 cm. Pulmonic Valve: The pulmonic valve was normal in structure. Pulmonic valve regurgitation is not visualized. Aorta: The aortic root and ascending aorta are structurally normal, with no evidence of dilitation. Venous: The inferior vena cava is dilated in size with less than 50% respiratory  variability, suggesting right atrial pressure of 15 mmHg. IAS/Shunts: No atrial level shunt detected by color flow Doppler. Additional Comments: There is a moderate pleural effusion in the right lateral region.  LEFT VENTRICLE PLAX 2D LVIDd:         5.50 cm  Diastology LVIDs:         4.40 cm  LV e' lateral:   9.23 cm/s LV PW:         1.40 cm  LV E/e' lateral: 10.2 LV IVS:        1.40 cm  LV e' medial:    7.05 cm/s LVOT diam:     2.50 cm  LV E/e' medial:  13.4 LV SV:         108 LV SV Index:   42 LVOT Area:     4.91 cm  RIGHT VENTRICLE RV S prime:     7.20 cm/s TAPSE (M-mode): 1.2 cm LEFT ATRIUM              Index       RIGHT ATRIUM           Index LA diam:        5.90 cm  2.30 cm/m  RA Area:     22.60 cm LA Vol (A2C):   121.0 ml 47.14 ml/m RA Volume:   68.80 ml  26.80 ml/m LA Vol (A4C):   95.5 ml  37.21 ml/m LA Biplane Vol: 110.0 ml 42.86 ml/m  AORTIC VALVE AV Area (Vmax):    0.82 cm AV Area (Vmean):    0.72 cm AV Area (VTI):     0.73 cm AV Vmax:           585.80 cm/s AV Vmean:          474.000 cm/s AV VTI:            1.480 m AV Peak Grad:      137.3 mmHg AV Mean Grad:      96.2 mmHg LVOT Vmax:         97.30 cm/s LVOT Vmean:        69.800 cm/s LVOT VTI:          0.220 m LVOT/AV VTI ratio: 0.15 AI PHT:            374 msec  AORTA Ao Root diam: 3.00 cm MITRAL VALVE               TRICUSPID VALVE MV Area (PHT): 5.54 cm    TR Peak grad:   44.9 mmHg MV Decel Time: 137 msec    TR Vmax:        335.00 cm/s MV E velocity: 94.30 cm/s MV A velocity: 46.30 cm/s  SHUNTS MV E/A ratio:  2.04        Systemic VTI:  0.22 m                            Systemic Diam: 2.50 cm Lyman Bishop MD Electronically signed by Lyman Bishop MD Signature Date/Time: 07/21/2019/3:42:23 PM    Final    VAS US CAROTID  Result Date: 07/25/2019 Carotid Arterial Duplex Study Indications:       Covid-19. Other Factors:     Critical Aortic Stenosis, CHF, Morbid Obesity. Limitations        Today's exam was limited due to the body habitus of the  patient. Comparison Study:  No prior study on file for comparison Performing Technologist: Sharion Dove RVS  Examination Guidelines: A complete evaluation includes B-mode imaging, spectral Doppler, color Doppler, and power Doppler as needed of all accessible portions of each vessel. Bilateral testing is considered an integral part of a complete examination. Limited examinations for reoccurring indications may be performed as noted.  Right Carotid Findings: +----------+--------+--------+--------+------------------+------------------+           PSV cm/sEDV cm/sStenosisPlaque DescriptionComments           +----------+--------+--------+--------+------------------+------------------+ CCA Prox  41      7                                 intimal thickening +----------+--------+--------+--------+------------------+------------------+ CCA Distal51      12                                 intimal thickening +----------+--------+--------+--------+------------------+------------------+ ICA Prox  42      12              heterogenous                         +----------+--------+--------+--------+------------------+------------------+ ICA Distal48      15                                                   +----------+--------+--------+--------+------------------+------------------+ ECA       62      10                                                   +----------+--------+--------+--------+------------------+------------------+ +----------+--------+-------+--------+-------------------+           PSV cm/sEDV cmsDescribeArm Pressure (mmHG) +----------+--------+-------+--------+-------------------+ [id number]                                         +----------+--------+-------+--------+-------------------+ +---------+--------+--+--------+-+ VertebralPSV cm/s28EDV cm/s9 +---------+--------+--+--------+-+  Left Carotid Findings: +----------+--------+--------+--------+------------------+------------------+           PSV cm/sEDV cm/sStenosisPlaque DescriptionComments           +----------+--------+--------+--------+------------------+------------------+ CCA Prox  73      12                                intimal thickening +----------+--------+--------+--------+------------------+------------------+ CCA Distal43      9                                 intimal thickening +----------+--------+--------+--------+------------------+------------------+ ICA Prox  54      11                                                   +----------+--------+--------+--------+------------------+------------------+ ICA Distal67  19                                                   +----------+--------+--------+--------+------------------+------------------+ ECA       53      5                                                     +----------+--------+--------+--------+------------------+------------------+ +----------+--------+--------+--------+-------------------+           PSV cm/sEDV cm/sDescribeArm Pressure (mmHG) +----------+--------+--------+--------+-------------------+ Subclavian113                                         +----------+--------+--------+--------+-------------------+ +---------+--------+--+--------+--+ VertebralPSV cm/s75EDV cm/s20 +---------+--------+--+--------+--+   Summary: Right Carotid: The extracranial vessels were near-normal with only minimal wall                thickening or plaque. Left Carotid: The extracranial vessels were near-normal with only minimal wall               thickening or plaque. Vertebrals:  Bilateral vertebral arteries demonstrate antegrade flow. Subclavians: Normal flow hemodynamics were seen in bilateral subclavian              arteries. *See table(s) above for measurements and observations.     Preliminary    VAS [place] LOWER EXTREMITY VENOUS (DVT)  Result Date: 07/23/2019  Lower Venous DVTStudy Indications: Edema, and elevated ddimer.  Limitations: Body habitus and poor ultrasound/tissue interface. Comparison Study: no prior Performing Technologist: Abram Sander RVS  Examination Guidelines: A complete evaluation includes B-mode imaging, spectral Doppler, color Doppler, and power Doppler as needed of all accessible portions of each vessel. Bilateral testing is considered an integral part of a complete examination. Limited examinations for reoccurring indications may be performed as noted. The reflux portion of the exam is performed with the patient in reverse Trendelenburg.  +---------+---------------+---------+-----------+----------+--------------+ RIGHT    CompressibilityPhasicitySpontaneityPropertiesThrombus Aging +---------+---------------+---------+-----------+----------+--------------+ CFV      Full           Yes      Yes                                  +---------+---------------+---------+-----------+----------+--------------+ SFJ      Full                                                        +---------+---------------+---------+-----------+----------+--------------+ FV Prox  Full                                                        +---------+---------------+---------+-----------+----------+--------------+ FV Mid                  Yes  Yes                                 +---------+---------------+---------+-----------+----------+--------------+ FV Distal               Yes      Yes                                 +---------+---------------+---------+-----------+----------+--------------+ PFV      Full                                                        +---------+---------------+---------+-----------+----------+--------------+ POP      Full           Yes      Yes                                 +---------+---------------+---------+-----------+----------+--------------+ PTV      Full                                                        +---------+---------------+---------+-----------+----------+--------------+ PERO     Full                                                        +---------+---------------+---------+-----------+----------+--------------+   +---------+---------------+---------+-----------+----------+--------------+ LEFT     CompressibilityPhasicitySpontaneityPropertiesThrombus Aging +---------+---------------+---------+-----------+----------+--------------+ CFV      Full           Yes      Yes                                 +---------+---------------+---------+-----------+----------+--------------+ SFJ      Full                                                        +---------+---------------+---------+-----------+----------+--------------+ FV Prox  Full                                                         +---------+---------------+---------+-----------+----------+--------------+ FV Mid   Full                                                        +---------+---------------+---------+-----------+----------+--------------+ FV Distal  Yes      Yes                                 +---------+---------------+---------+-----------+----------+--------------+ PFV      Full                                                        +---------+---------------+---------+-----------+----------+--------------+ POP      Full           Yes      Yes                                 +---------+---------------+---------+-----------+----------+--------------+ PTV      Full                                                        +---------+---------------+---------+-----------+----------+--------------+ PERO                                                  Not visualized +---------+---------------+---------+-----------+----------+--------------+     Summary: BILATERAL: - No evidence of deep vein thrombosis seen in the lower extremities, bilaterally. -   *See table(s) above for measurements and observations. Electronically signed by Monica Martinez MD on 07/23/2019 at 5:31:24 PM.    Final     Phillips Climes M.D on 07/26/2019 at 2:50 PM  Between 7am to 7pm - Pager - [phone]  After 7pm go to [email or web address] - password [facility]  [facility] -  Office  [phone]

## 2019-07-27 ENCOUNTER — Telehealth: Payer: Self-pay | Admitting: Physician Assistant

## 2019-07-27 DIAGNOSIS — N3289 Other specified disorders of bladder: Secondary | ICD-10-CM

## 2019-07-27 LAB — COMPREHENSIVE METABOLIC PANEL
ALT: 19 U/L (ref 0–44)
AST: 35 U/L (ref 15–41)
Albumin: 3.3 g/dL — ABNORMAL LOW (ref 3.5–5.0)
Alkaline Phosphatase: 287 U/L — ABNORMAL HIGH (ref 38–126)
Anion gap: 11 (ref 5–15)
BUN: 17 mg/dL (ref 6–20)
CO2: 33 mmol/L — ABNORMAL HIGH (ref 22–32)
Calcium: 9.1 mg/dL (ref 8.9–10.3)
Chloride: 95 mmol/L — ABNORMAL LOW (ref 98–111)
Creatinine, Ser: 0.93 mg/dL (ref 0.61–1.24)
GFR calc Af Amer: >60 mL/min (ref >60)
GFR calc non Af Amer: >60 mL/min (ref >60)
Glucose, Bld: 127 mg/dL — ABNORMAL HIGH (ref 70–99)
Potassium: 4.1 mmol/L (ref 3.5–5.1)
Sodium: 139 mmol/L (ref 135–145)
Total Bilirubin: 2.9 mg/dL — ABNORMAL HIGH (ref 0.3–1.2)
Total Protein: 6.8 g/dL (ref 6.5–8.1)

## 2019-07-27 LAB — CBC WITH DIFFERENTIAL/PLATELET
Abs Immature Granulocytes: 0.07 10*3/uL (ref 0.00–0.07)
Basophils Absolute: 0 10*3/uL (ref 0.0–0.1)
Basophils Relative: 1 %
Eosinophils Absolute: 0.1 10*3/uL (ref 0.0–0.5)
Eosinophils Relative: 2 %
HCT: 43.5 % (ref 39.0–52.0)
Hemoglobin: 13.8 g/dL (ref 13.0–17.0)
Immature Granulocytes: 1 %
Lymphocytes Relative: 12 %
Lymphs Abs: 1 10*3/uL (ref 0.7–4.0)
MCH: 29.7 pg (ref 26.0–34.0)
MCHC: 31.7 g/dL (ref 30.0–36.0)
MCV: 93.5 fL (ref 80.0–100.0)
Monocytes Absolute: 0.5 10*3/uL (ref 0.1–1.0)
Monocytes Relative: 6 %
Neutro Abs: 6.5 10*3/uL (ref 1.7–7.7)
Neutrophils Relative %: 78 %
Platelets: 195 10*3/uL (ref 150–400)
RBC: 4.65 MIL/uL (ref 4.22–5.81)
RDW: 16.8 % — ABNORMAL HIGH (ref 11.5–15.5)
WBC: 8.2 10*3/uL (ref 4.0–10.5)
nRBC: 0 % (ref 0.0–0.2)

## 2019-07-27 LAB — URINALYSIS, ROUTINE W REFLEX MICROSCOPIC
Bilirubin Urine: NEGATIVE
Glucose, UA: NEGATIVE mg/dL
Hgb urine dipstick: NEGATIVE
Ketones, ur: NEGATIVE mg/dL
Leukocytes,Ua: NEGATIVE
Nitrite: NEGATIVE
Protein, ur: NEGATIVE mg/dL
Specific Gravity, Urine: 1.012 (ref 1.005–1.030)
pH: 7 (ref 5.0–8.0)

## 2019-07-27 LAB — MAGNESIUM: Magnesium: 2 mg/dL (ref 1.7–2.4)

## 2019-07-27 MED ORDER — ADULT MULTIVITAMIN W/MINERALS CH
1.0000 | ORAL_TABLET | Freq: Every day | ORAL | Status: DC
  Administered 2019-07-27 – 2019-08-05 (×9): 1 via ORAL
  Filled 2019-07-27 (×9): qty 1

## 2019-07-27 NOTE — Progress Notes (Signed)
PROGRESS NOTE                                                                                                                                                                                                             Patient Demographics:    Steve Richards, is a 54 y.o. male, DOB - 1966/01/25, ZOX:[id number]  Admit date - 07/21/2019   Admitting Physician Karmen Bongo, MD  Outpatient Primary MD for the patient is System, Pcp Not In  LOS - 6   Chief Complaint  Patient presents with  . Shortness of Breath  . Leg Swelling       Brief Narrative     Steve Richards is a 54 y.o. male with medical history significant of R pleural effusion in April 2021 presenting with SOB and LE edema.    Patient did not follow with PCP for last 6 years, patient with recent work-up as an outpatient and negative for CHF, volume overload with thoracentesis status post drain, as well evidence of liver cirrhosis, and recent diagnosis of left lower extremity cellulitis where he was started on Keflex, ED secondary to dyspnea, evidence of volume overload, as well he tested Covid positive on admission .   Subjective:    Steve Richards today has, No headache, No chest pain, No abdominal pain, overall report is feeling better, dyspnea has significantly improved as well.   Assessment  & Plan :    Principal Problem:   New onset of congestive heart failure (HCC) Active Problems:   Hepatic cirrhosis (HCC)   Pleural effusion on right   Systolic ejection murmur   Cellulitis and abscess of left lower extremity   Hyperglycemia   Obesity, Class III, BMI 40-49.9 (morbid obesity) (HCC)   Acute systolic/diastolic CHF -Did not follow with his PCP for last 6 years, with poor outpatient follow-up. -Evidence of volume overload on imaging, recurrent pleural effusion, elevated BNP and lower extremity edema. -Continue with IV diuresis, seems to be diuresing appropriately on current dose, and  blood pressure has been tolerating it, so continue with 40 mg IV twice daily, still significantly volume overloaded and for the need of IV diuresis, continue with daily labs close monitoring .  So far he is -19 L, will continue with current dose -2D echo significant for low EF 40 to 45%, with grade 2 diastolic  CHF, cardiology has been consulted regarding further recommendations. -Blood pressure is persistently on the lower side, there is no room to add any beta-blockers or ACEi/ARB  Severe aortic stenosis -Management per cardiology . -likely contributing to multiple comorbidities, possibly causing cardiac cirrhosis, his lower extremity edema contributing to venous stasis and cellulitis. -Structural heart team input greatly appreciated, plan for imaging this weekend, and cardiac cath on Tuesday.   COVID-19 infection -Patient denies any fever, cough currently, no evidence of pneumonia on imaging, he has no hypoxia, so for now no indication for steroids and remdesivir, but will continue to monitor closely especially with significantly elevated inflammatory markers. -D-dimers elevated at 6 on admission, it is contrary it is currently trending down, tinea with current DVT prophylaxis dose, his venous Doppler negative for DVT .  COVID-19 Labs  Recent Labs    07/25/19 0858 07/26/19 0350  DDIMER 3.26* 3.03*  CRP 3.5* 3.0*    Lab Results  Component Value Date   SARSCOV2NAA POSITIVE (A) 07/21/2019   [place] NEGATIVE 06/23/2019   Liver cirrhosis -Patient denies any history of alcohol abuse in the past, this is most likely related to cardiac cirrhosis versus NASH. -We will DC Aldactone given soft blood pressure -Negative hepatitis panel  Left lower extremity cellulitis -He appears to be having left lower extremity cellulitis, which did not respond to Keflex, as well there is evidence of venous stasis disease contributing to his infection, as well significant lower extremity edema as  well. -Initially on IV vancomycin, patient IV cefazolin, currently on doxycycline  Questionable findings of bladder mass -It was present on the CT abdomen today, will discuss with urology.  Hyperglycemia -A1c is 6.1  Morbid obesity  Hypothyroidism -TSH elevated at 14, with low normal free T4 at 0.65, started on low-dose Synthroid   Code Status : Full  Disposition Plan  :  Status is: Inpatient  Remains inpatient appropriate because:IV treatments appropriate due to intensity of illness or inability to take PO   Dispo: The patient is from: Home              Anticipated d/c is to: Home              Anticipated d/c date is: 3 days              Patient currently is not medically stable to d/c.  Patient still with significant volume overload, requiring further IV diuresis.  Plan for cardiac cath on Tuesday       Consults  :  Cardiology  Procedures  : None  DVT Prophylaxis  :  [place] lovenox  Lab Results  Component Value Date   PLT 195 07/27/2019    Antibiotics  :   Anti-infectives (From admission, onward)   Start     Dose/Rate Route Frequency Ordered Stop   07/25/19 1530  doxycycline (VIBRA-TABS) tablet 100 mg  Status:  Discontinued     100 mg Oral Every 12 hours 07/25/19 1521 07/27/19 0809   07/22/19 1400  ceFAZolin (ANCEF) IVPB 2g/100 mL premix  Status:  Discontinued     2 g 200 mL/hr over 30 Minutes Intravenous Every 8 hours 07/22/19 1223 07/25/19 1521   07/22/19 0000  vancomycin (VANCOREADY) IVPB 1250 mg/250 mL  Status:  Discontinued     1,250 mg 166.7 mL/hr over 90 Minutes Intravenous Every 12 hours 07/21/19 1141 07/22/19 1223   07/21/19 1145  vancomycin (VANCOREADY) IVPB 2000 mg/400 mL     2,000 mg 200 mL/hr over  120 Minutes Intravenous  Once 07/21/19 1141 07/21/19 1509        Objective:   Vitals:   07/26/19 2135 07/26/19 2159 07/27/19 0558 07/27/19 0730  BP: 101/70  90/66 92/68  Pulse:  88 79 79  Resp:   18 17  Temp:  98.3 F (36.8 C) (!) 97.4 F  (36.3 C) 98 F (36.7 C)  TempSrc:  Oral Oral Oral  SpO2:  92% 90% 94%  Weight:    124.2 kg  Height:        Wt Readings from Last 3 Encounters:  07/27/19 124.2 kg  09/24/11 125.2 kg     Intake/Output Summary (Last 24 hours) at 07/27/2019 1552 Last data filed at 07/27/2019 1453 Gross per 24 hour  Intake 513 ml  Output 4625 ml  Net -4112 ml     Physical Exam  Awake Alert, Oriented X 3, No new F.N deficits, Normal affect Symmetrical Chest wall movement, Good air movement bilaterally, CTAB RRR,No Gallops,Rubs, systolic murmur present, No Parasternal Heave +ve B.Sounds, Abd Soft, No tenderness, No rebound - guarding or rigidity. No Cyanosis, Clubbing, lower extremity edema is improving, but remains significant    Data Review:    CBC Recent Labs  Lab 07/23/19 0622 07/24/19 1237 07/25/19 0858 07/26/19 0350 07/27/19 0537  WBC 7.3 7.9 6.9 7.9 8.2  HGB 13.6 14.6 13.7 13.6 13.8  HCT 42.5 45.8 42.9 43.0 43.5  PLT 197 215 193 197 195  MCV 93.6 92.9 93.9 93.5 93.5  MCH 30.0 29.6 30.0 29.6 29.7  MCHC 32.0 31.9 31.9 31.6 31.7  RDW 16.4* 16.5* 16.5* 16.8* 16.8*  LYMPHSABS 0.9 0.9 0.9 1.0 1.0  MONOABS 0.6 0.5 0.4 0.5 0.5  EOSABS 0.1 0.1 0.1 0.1 0.1  BASOSABS 0.1 0.0 0.0 0.0 0.0    Chemistries  Recent Labs  Lab 07/23/19 0622 07/24/19 1237 07/25/19 0858 07/26/19 0350 07/27/19 0537  NA 138 139 138 136 139  K 3.9 4.1 4.2 3.6 4.1  CL 96* 93* 93* 94* 95*  CO2 33* 34* 34* 35* 33*  GLUCOSE 112* 116* 150* 119* 127*  BUN 16 18 16 16 17   CREATININE 0.96 0.95 1.00 0.92 0.93  CALCIUM 8.6* 9.1 8.9 8.7* 9.1  MG 1.8 2.0 2.0 2.0 2.0  AST 27 36 35 36 35  ALT 22 22 19 18 19   ALKPHOS 233* 284* 268* 253* 287*  BILITOT 2.6* 2.8* 2.6* 2.3* 2.9*   ------------------------------------------------------------------------------------------------------------------ No results for input(s): CHOL, HDL, LDLCALC, TRIG, CHOLHDL, LDLDIRECT in the last 72 hours.  Lab Results  Component  Value Date   HGBA1C 6.1 (H) 07/21/2019   ------------------------------------------------------------------------------------------------------------------ No results for input(s): TSH, T4TOTAL, T3FREE, THYROIDAB in the last 72 hours.  Invalid input(s): FREET3 ------------------------------------------------------------------------------------------------------------------ No results for input(s): VITAMINB12, FOLATE, FERRITIN, TIBC, IRON, RETICCTPCT in the last 72 hours.  Coagulation profile No results for input(s): INR, PROTIME in the last 168 hours.  Recent Labs    07/25/19 0858 07/26/19 0350  DDIMER 3.26* 3.03*    Cardiac Enzymes No results for input(s): CKMB, TROPONINI, MYOGLOBIN in the last 168 hours.  Invalid input(s): CK ------------------------------------------------------------------------------------------------------------------    Component Value Date/Time   BNP 1,142.6 (H) 07/21/2019 1316    Inpatient Medications  Scheduled Meds: . aspirin EC  81 mg Oral Daily  . docusate sodium  100 mg Oral BID  . enoxaparin (LOVENOX) injection  65 mg Subcutaneous Q24H  . feeding supplement (ENSURE ENLIVE)  237 mL Oral Q1500  . feeding supplement (PRO-STAT SUGAR FREE  64)  30 mL Oral Daily  . furosemide  40 mg Intravenous Q12H  . levothyroxine  25 mcg Oral Q0600  . multivitamin with minerals  1 tablet Oral Daily  . saccharomyces boulardii  250 mg Oral BID  . sodium chloride flush  10-40 mL Intracatheter Q12H  . sodium chloride flush  3 mL Intravenous Q12H   Continuous Infusions: . sodium chloride     PRN Meds:.sodium chloride, acetaminophen **OR** acetaminophen, bisacodyl, hydrALAZINE, HYDROcodone-acetaminophen, ondansetron **OR** ondansetron (ZOFRAN) IV, polyethylene glycol, sodium chloride flush, sodium chloride flush  Micro Results Recent Results (from the past 240 hour(s))  Culture, blood (routine x 2)     Status: None   Collection Time: 07/21/19 12:37 PM    Specimen: BLOOD RIGHT HAND  Result Value Ref Range Status   Specimen Description BLOOD RIGHT HAND  Final   Special Requests   Final    BOTTLES DRAWN AEROBIC ONLY Blood Culture results may not be optimal due to an inadequate volume of blood received in culture bottles   Culture   Final    NO GROWTH 5 DAYS Performed at [facility], [place] [address], [place], [place] [address]    Report Status 07/26/2019 FINAL  Final  SARS Coronavirus 2 by RT PCR (hospital order, performed in [facility] hospital lab) Nasopharyngeal Nasopharyngeal Swab     Status: Abnormal   Collection Time: 07/21/19 12:37 PM   Specimen: Nasopharyngeal Swab  Result Value Ref Range Status   SARS Coronavirus 2 POSITIVE (A) NEGATIVE Final    Comment: RESULT CALLED TO, READ BACK BY AND VERIFIED WITH: Angelina Ok RN 16:45 07/21/19 ([email or web address]) (NOTE) SARS-CoV-2 target nucleic acids are DETECTED SARS-CoV-2 RNA is generally detectable in upper respiratory specimens  during the acute phase of infection.  Positive results are indicative  of the presence of the identified virus, but do not rule out bacterial infection or co-infection with other pathogens not detected by the test.  Clinical correlation with patient history and  other diagnostic information is necessary to determine patient infection status.  The expected result is negative. Fact Sheet for Patients:   StrictlyIdeas.no  Fact Sheet for Healthcare Providers:   BankingDealers.co.za   This test is not yet approved or cleared by the [place] FDA and  has been authorized for detection and/or diagnosis of SARS-CoV-2 by FDA under an Emergency Use Authorization (EUA).  This EUA will remain in effect (meaning this  test can be used) for the duration of  the COVID-19 declaration under Section 564(b)(1) of the Act, 21 U.S.C. section 360-bbb-3(b)(1), unless the authorization is terminated or revoked sooner. Performed  at [facility], [place] [address], [place], [place] [address]   Culture, blood (routine x 2)     Status: None   Collection Time: 07/21/19 12:38 PM   Specimen: BLOOD  Result Value Ref Range Status   Specimen Description BLOOD RIGHT ANTECUBITAL  Final   Special Requests   Final    BOTTLES DRAWN AEROBIC AND ANAEROBIC Blood Culture results may not be optimal due to an inadequate volume of blood received in culture bottles   Culture   Final    NO GROWTH 5 DAYS Performed at [facility], [place] [address], [place], [place] [address]    Report Status 07/26/2019 FINAL  Final    Radiology Reports DG Chest 2 View  Result Date: 07/21/2019 CLINICAL DATA:  Chest pain and shortness of breath EXAM: CHEST - 2 VIEW COMPARISON:  06/25/2018 FINDINGS: Congested appearance of vessels with small pleural effusions. Cardiomegaly that is chronic. Negative aortic contours. IMPRESSION: Cardiomegaly with vascular congestion and small pleural effusions. Electronically Signed   By: Monte Fantasia M.D.   On: 07/21/2019 08:42   CT CORONARY MORPH W/CTA COR W/SCORE W/CA W/CM &/OR WO/CM  Addendum Date: 07/27/2019   ADDENDUM REPORT: 07/27/2019 12:22 ADDENDUM: Extracardiac findings are described separately under dictation for contemporaneously obtained CTA chest, abdomen and pelvis performed on 07/25/2019. Please see dictation for that examination for description of relevant extracardiac findings. Electronically Signed   By: Vinnie Langton M.D.   On: 07/27/2019 12:22   Result Date: 07/27/2019 CLINICAL DATA:  54 year old male with severe aortic stenosis being evaluated for a TAVR procedure. EXAM: Cardiac TAVR CT TECHNIQUE: The patient was scanned on a [employer]. A 120 kV retrospective scan was triggered in the descending thoracic aorta at 111 HU's. Gantry rotation speed was 250 msecs and collimation was .6 mm. No beta blockade or nitro were given. The 3D data set was reconstructed in 5% intervals of the  R-R cycle. Systolic and diastolic phases were analyzed on a dedicated work station using MPR, MIP and VRT modes. The patient received 80 cc of contrast. FINDINGS: Aortic Valve: Trileaflet aortic valve with severely calcified and thickened leaflets and only mild calcifications extending into the LVOT. Aorta: Normal size, minimal plaque, no calcifications, no dissection. Sinotubular Junction: 30 x 29 mm Ascending Thoracic Aorta: 35 x 34 mm Aortic Arch: Not visualized. Descending Thoracic Aorta: 22 x 22 mm Sinus of Valsalva Measurements: Non-coronary: 34 mm Right -coronary: 31 mm Left -coronary: 35 mm Coronary Artery Height above Annulus: Left Main: 13.1 mm Right Coronary: 18.2 mm Virtual Basal Annulus Measurements: Maximum/Minimum Diameter: 34.0 x 25.7 mm Mean Diameter: 28.2 mm Perimeter: 94.1 mm Area: 626 mm2 Coronary Arteries: Calcium score is 0. Coronary Arteries:  Normal coronary origin.  Right dominance. RCA is a large dominant artery that gives rise to PDA and PLA. There is no plaque. Left main is a large artery that gives rise to LAD and LCX arteries. Left main has no plaque. LAD is a large vessel that has no plaque. LCX is a non-dominant artery that gives rise to one large OM1 branch. There is no plaque. Optimum Fluoroscopic Angle for Delivery: RAO 0 CRA 0 IMPRESSION: 1. Trileaflet aortic valve with severely calcified and thickened leaflets and only mild calcifications extending into the LVOT. Aortic valve calcium score 7968 consistent with severe aortic stenosis. Annular measurements suitable for delivery of a 29 mm Edwards-SAPIEN 3 valve. 2. Sufficient coronary to annulus distance. 3. Optimum Fluoroscopic Angle for Delivery:  RAO 0 CRA 0. 4. No thrombus in the left atrial appendage. 5. Coronary Arteries: Normal origin. Right dominance. Calcium score is 0. There are only minimal irregularities. 6. Dilated pulmonary artery measuring 35 suggestive of pulmonary hypertension. Electronically Signed: By: Ena Dawley On: 07/26/2019 13:01   CT ANGIO CHEST AORTA W/CM & OR WO/CM  Result Date: 07/27/2019 CLINICAL DATA:  54 year old male with history of severe aortic stenosis. Preprocedural study prior to potential transcatheter aortic valve replacement (TAVR) procedure. EXAM: CT ANGIOGRAPHY CHEST, ABDOMEN AND PELVIS TECHNIQUE: Non-contrast CT of the chest was initially obtained. Multidetector CT imaging through the chest, abdomen and pelvis was performed using the standard protocol during bolus administration of intravenous contrast. Multiplanar reconstructed images and MIPs were obtained and reviewed to evaluate the vascular anatomy. CONTRAST:  128m OMNIPAQUE IOHEXOL 350 MG/ML SOLN COMPARISON:  No priors.  FINDINGS: CTA CHEST FINDINGS Cardiovascular: Heart size is mildly enlarged. There is no significant pericardial fluid, thickening or pericardial calcification. Aortic atherosclerosis. No definite coronary artery calcifications. Severe thickening calcification of the aortic valve. Dilatation of the pulmonic trunk (3.6 cm in diameter). Mediastinum/Lymph Nodes: No pathologically enlarged mediastinal or hilar lymph nodes. Esophagus is unremarkable in appearance. No axillary lymphadenopathy. Lungs/Pleura: Patchy but diffuse ground-glass attenuation and mild interlobular septal thickening noted throughout the lungs bilaterally, suggesting a background of mild interstitial pulmonary edema. Moderate bilateral pleural effusions lying dependently with some associated passive subsegmental atelectasis in the lower lobes of the lungs bilaterally. No definite suspicious appearing pulmonary nodules or masses. Musculoskeletal/Soft Tissues: Old healed fracture of the mid sternum with mild posttraumatic deformity. There are no aggressive appearing lytic or blastic lesions noted in the visualized portions of the skeleton. CTA ABDOMEN AND PELVIS FINDINGS Hepatobiliary: Liver has a slightly shrunken appearance and nodular contour,  indicative of underlying cirrhosis. No discrete cystic or solid hepatic lesions. No intra or extrahepatic biliary ductal dilatation. Gallbladder is nearly completely decompressed, and otherwise unremarkable in appearance. Pancreas: No pancreatic mass. No pancreatic ductal dilatation. No pancreatic or peripancreatic fluid collections or inflammatory changes. Spleen: Unremarkable. Adrenals/Urinary Tract: Left kidney and bilateral adrenal glands are normal in appearance. In the lower pole of the right kidney there is an exophytic low-attenuation nonenhancing lesion measuring 8 cm in diameter, compatible with a simple cyst. No hydroureteronephrosis. Amorphous mass-like area of enhancement along the posterior wall of the urinary bladder measuring approximately 6.4 x 2.0 x 2.5 cm (axial image 214 of series 6 and sagittal image 111 of series 9). Stomach/Bowel: Appearance of the stomach is normal. No pathologic dilatation of small bowel or colon. A few scattered colonic diverticulae are noted, without definite surrounding inflammatory changes to suggest an acute diverticulitis (study is limited by presence of small volume of ascites). Normal appendix. Vascular/Lymphatic: Aortic atherosclerosis with vascular findings and measurements pertinent to potential TAVR procedure, as detailed below. No aneurysm or dissection noted in the abdominal or pelvic vasculature. No lymphadenopathy noted in the abdomen or pelvis. Reproductive: Prostate gland and seminal vesicles are unremarkable in appearance. Other: Small volume of ascites.  No pneumoperitoneum. Musculoskeletal: There are no aggressive appearing lytic or blastic lesions noted in the visualized portions of the skeleton. Mild diffuse body wall edema. VASCULAR MEASUREMENTS PERTINENT TO TAVR: AORTA: Minimal Aortic Diameter-16 x 16 mm Severity of Aortic Calcification-mild RIGHT PELVIS: Right Common Iliac Artery - Minimal Diameter-9.0 x 11.1 mm Tortuosity - mild  Calcification-minimal Right External Iliac Artery - Minimal Diameter-9.5 x 9.1 mm Tortuosity - mild Calcification-none Right Common Femoral Artery - Minimal Diameter-6.4 x 8.6 mm Tortuosity - mild Calcification-none LEFT PELVIS: Left Common Iliac Artery - Minimal Diameter-11.6 x 9.9 mm Tortuosity - mild Calcification-minimal Left External Iliac Artery - Minimal Diameter-11.5 x 8.8 mm Tortuosity - mild Calcification-none Left Common Femoral Artery - Minimal Diameter-9.1 x 8.7 mm Tortuosity-mild Calcification-none Review of the MIP images confirms the above findings. IMPRESSION: 1. Vascular findings and measurements pertinent to potential TAVR procedure, as detailed above. 2. Severe thickening calcification of the aortic valve, compatible with reported clinical history of severe aortic stenosis. 3. Cardiomegaly with evidence of mild interstitial pulmonary edema in the lungs and moderate bilateral pleural effusions; imaging findings concerning for congestive heart failure. 4. Amorphous mass-like area of enhancement along the posterior wall of the urinary bladder measuring approximately 6.4 x 2.0 x 2.5 cm. Nonemergent Urologic consultation is strongly recommended in the near future to better  evaluate this finding, as the possibility of an infiltrative bladder neoplasm is not excluded. 5. There is also dilatation of the pulmonic trunk (3.6 cm in diameter), concerning for pulmonary arterial hypertension. 6. Morphologic changes in the liver suggesting early cirrhosis. 7. Small volume of ascites. 8. Mild diffuse body wall edema. 9. Additional incidental findings, as above. These results will be called to the ordering clinician or representative by the Radiologist Assistant, and communication documented in the PACS or [employer]. Electronically Signed   By: Vinnie Langton M.D.   On: 07/27/2019 12:47   ECHOCARDIOGRAM COMPLETE  Result Date: 07/21/2019    ECHOCARDIOGRAM REPORT   Patient Name:   Steve Richards Date of  Exam: 07/21/2019 Medical Rec #:  [id number]   Height:       72.0 in Accession #:    [id number]  Weight:       310.0 lb Date of Birth:  Dec 03, 1965   BSA:          2.567 m Patient Age:    1 years    BP:           102/73 mmHg Patient Gender: M           HR:           73 bpm. Exam Location:  Inpatient Procedure: 2D Echo Indications:    CHF-Acute Systolic 193.79 / K24.09  History:        Patient has no prior history of Echocardiogram examinations.                 Risk Factors:Obesity. Pleural effusion on right.  Sonographer:    Vikki Ports Turrentine Referring Phys: [place]  1. Left ventricular ejection fraction, by estimation, is 40 to 45%. The left ventricle has mildly decreased function. The left ventricle demonstrates global hypokinesis. There is moderate left ventricular hypertrophy. Left ventricular diastolic parameters are consistent with Grade II diastolic dysfunction (pseudonormalization).  2. Right ventricular systolic function is mildly reduced. The right ventricular size is normal. There is moderately elevated pulmonary artery systolic pressure.  3. Left atrial size was moderately dilated.  4. The mitral valve is abnormal. Trivial mitral valve regurgitation.  5. Cannot exclude a bicuspid valve. Aortic valve regurgitation is mild. Critical aortic valve stenosis. Aortic valve area, by VTI measures 0.73 cm. Aortic valve mean gradient measures 96.2 mmHg. Peak gradient of 137 mmHg. Aortic valve Vmax measures 5.86 m/s.  6. The inferior vena cava is dilated in size with <50% respiratory variability, suggesting right atrial pressure of 15 mmHg.  7. Moderate pleural effusion in the right lateral region. FINDINGS  Left Ventricle: Left ventricular ejection fraction, by estimation, is 40 to 45%. The left ventricle has mildly decreased function. The left ventricle demonstrates global hypokinesis. The left ventricular internal cavity size was normal in size. There is  moderate left ventricular  hypertrophy. Left ventricular diastolic parameters are consistent with Grade II diastolic dysfunction (pseudonormalization). Indeterminate filling pressures. Right Ventricle: The right ventricular size is normal. No increase in right ventricular wall thickness. Right ventricular systolic function is mildly reduced. There is moderately elevated pulmonary artery systolic pressure. The tricuspid regurgitant velocity is 3.35 m/s, and with an assumed right atrial pressure of 15 mmHg, the estimated right ventricular systolic pressure is 73.5 mmHg. Left Atrium: Left atrial size was moderately dilated. Right Atrium: Right atrial size was normal in size. Pericardium: There is no evidence of pericardial effusion. Mitral Valve: The mitral valve is abnormal. Mild to moderate mitral  annular calcification. Trivial mitral valve regurgitation. Tricuspid Valve: The tricuspid valve is grossly normal. Tricuspid valve regurgitation is mild. Aortic Valve: The aortic valve is tricuspid. . There is moderate thickening and severe calcifcation of the aortic valve. Aortic valve regurgitation is mild. Aortic regurgitation PHT measures 374 msec. Severe aortic stenosis is present. There is moderate thickening of the aortic valve. There is severe calcifcation of the aortic valve. Aortic valve mean gradient measures 96.2 mmHg. Aortic valve peak gradient measures 137.3 mmHg. Aortic valve area, by VTI measures 0.73 cm. Pulmonic Valve: The pulmonic valve was normal in structure. Pulmonic valve regurgitation is not visualized. Aorta: The aortic root and ascending aorta are structurally normal, with no evidence of dilitation. Venous: The inferior vena cava is dilated in size with less than 50% respiratory variability, suggesting right atrial pressure of 15 mmHg. IAS/Shunts: No atrial level shunt detected by color flow Doppler. Additional Comments: There is a moderate pleural effusion in the right lateral region.  LEFT VENTRICLE PLAX 2D LVIDd:          5.50 cm  Diastology LVIDs:         4.40 cm  LV e' lateral:   9.23 cm/s LV PW:         1.40 cm  LV E/e' lateral: 10.2 LV IVS:        1.40 cm  LV e' medial:    7.05 cm/s LVOT diam:     2.50 cm  LV E/e' medial:  13.4 LV SV:         108 LV SV Index:   42 LVOT Area:     4.91 cm  RIGHT VENTRICLE RV S prime:     7.20 cm/s TAPSE (M-mode): 1.2 cm LEFT ATRIUM              Index       RIGHT ATRIUM           Index LA diam:        5.90 cm  2.30 cm/m  RA Area:     22.60 cm LA Vol (A2C):   121.0 ml 47.14 ml/m RA Volume:   68.80 ml  26.80 ml/m LA Vol (A4C):   95.5 ml  37.21 ml/m LA Biplane Vol: 110.0 ml 42.86 ml/m  AORTIC VALVE AV Area (Vmax):    0.82 cm AV Area (Vmean):   0.72 cm AV Area (VTI):     0.73 cm AV Vmax:           585.80 cm/s AV Vmean:          474.000 cm/s AV VTI:            1.480 m AV Peak Grad:      137.3 mmHg AV Mean Grad:      96.2 mmHg LVOT Vmax:         97.30 cm/s LVOT Vmean:        69.800 cm/s LVOT VTI:          0.220 m LVOT/AV VTI ratio: 0.15 AI PHT:            374 msec  AORTA Ao Root diam: 3.00 cm MITRAL VALVE               TRICUSPID VALVE MV Area (PHT): 5.54 cm    TR Peak grad:   44.9 mmHg MV Decel Time: 137 msec    TR Vmax:        335.00 cm/s MV E velocity: 94.30 cm/s MV A  velocity: 46.30 cm/s  SHUNTS MV E/A ratio:  2.04        Systemic VTI:  0.22 m                            Systemic Diam: 2.50 cm Lyman Bishop MD Electronically signed by Lyman Bishop MD Signature Date/Time: 07/21/2019/3:42:23 PM    Final    VAS US CAROTID  Result Date: 07/27/2019 Carotid Arterial Duplex Study Indications:       Covid-19. Other Factors:     Critical Aortic Stenosis, CHF, Morbid Obesity. Limitations        Today's exam was limited due to the body habitus of the                    patient. Comparison Study:  No prior study on file for comparison Performing Technologist: Sharion Dove RVS  Examination Guidelines: A complete evaluation includes B-mode imaging, spectral Doppler, color Doppler, and power Doppler  as needed of all accessible portions of each vessel. Bilateral testing is considered an integral part of a complete examination. Limited examinations for reoccurring indications may be performed as noted.  Right Carotid Findings: +----------+--------+--------+--------+------------------+------------------+           PSV cm/sEDV cm/sStenosisPlaque DescriptionComments           +----------+--------+--------+--------+------------------+------------------+ CCA Prox  41      7                                 intimal thickening +----------+--------+--------+--------+------------------+------------------+ CCA Distal51      12                                intimal thickening +----------+--------+--------+--------+------------------+------------------+ ICA Prox  42      12              heterogenous                         +----------+--------+--------+--------+------------------+------------------+ ICA Distal48      15                                                   +----------+--------+--------+--------+------------------+------------------+ ECA       62      10                                                   +----------+--------+--------+--------+------------------+------------------+ +----------+--------+-------+--------+-------------------+           PSV cm/sEDV cmsDescribeArm Pressure (mmHG) +----------+--------+-------+--------+-------------------+ [id number]                                         +----------+--------+-------+--------+-------------------+ +---------+--------+--+--------+-+ VertebralPSV cm/s28EDV cm/s9 +---------+--------+--+--------+-+  Left Carotid Findings: +----------+--------+--------+--------+------------------+------------------+           PSV cm/sEDV cm/sStenosisPlaque DescriptionComments           +----------+--------+--------+--------+------------------+------------------+ CCA Prox  73      12  intimal thickening +----------+--------+--------+--------+------------------+------------------+ CCA Distal43      9                                 intimal thickening +----------+--------+--------+--------+------------------+------------------+ ICA Prox  54      11                                                   +----------+--------+--------+--------+------------------+------------------+ ICA Distal67      19                                                   +----------+--------+--------+--------+------------------+------------------+ ECA       53      5                                                    +----------+--------+--------+--------+------------------+------------------+ +----------+--------+--------+--------+-------------------+           PSV cm/sEDV cm/sDescribeArm Pressure (mmHG) +----------+--------+--------+--------+-------------------+ Subclavian113                                         +----------+--------+--------+--------+-------------------+ +---------+--------+--+--------+--+ VertebralPSV cm/s75EDV cm/s20 +---------+--------+--+--------+--+   Summary: Right Carotid: The extracranial vessels were near-normal with only minimal wall                thickening or plaque. Left Carotid: The extracranial vessels were near-normal with only minimal wall               thickening or plaque. Vertebrals:  Bilateral vertebral arteries demonstrate antegrade flow. Subclavians: Normal flow hemodynamics were seen in bilateral subclavian              arteries. *See table(s) above for measurements and observations.  Electronically signed by Servando Snare MD on 07/27/2019 at 12:53:06 AM.    Final    VAS [place] LOWER EXTREMITY VENOUS (DVT)  Result Date: 07/23/2019  Lower Venous DVTStudy Indications: Edema, and elevated ddimer.  Limitations: Body habitus and poor ultrasound/tissue interface. Comparison Study: no prior Performing Technologist: Abram Sander RVS   Examination Guidelines: A complete evaluation includes B-mode imaging, spectral Doppler, color Doppler, and power Doppler as needed of all accessible portions of each vessel. Bilateral testing is considered an integral part of a complete examination. Limited examinations for reoccurring indications may be performed as noted. The reflux portion of the exam is performed with the patient in reverse Trendelenburg.  +---------+---------------+---------+-----------+----------+--------------+ RIGHT    CompressibilityPhasicitySpontaneityPropertiesThrombus Aging +---------+---------------+---------+-----------+----------+--------------+ CFV      Full           Yes      Yes                                 +---------+---------------+---------+-----------+----------+--------------+ SFJ      Full                                                        +---------+---------------+---------+-----------+----------+--------------+  FV Prox  Full                                                        +---------+---------------+---------+-----------+----------+--------------+ FV Mid                  Yes      Yes                                 +---------+---------------+---------+-----------+----------+--------------+ FV Distal               Yes      Yes                                 +---------+---------------+---------+-----------+----------+--------------+ PFV      Full                                                        +---------+---------------+---------+-----------+----------+--------------+ POP      Full           Yes      Yes                                 +---------+---------------+---------+-----------+----------+--------------+ PTV      Full                                                        +---------+---------------+---------+-----------+----------+--------------+ PERO     Full                                                         +---------+---------------+---------+-----------+----------+--------------+   +---------+---------------+---------+-----------+----------+--------------+ LEFT     CompressibilityPhasicitySpontaneityPropertiesThrombus Aging +---------+---------------+---------+-----------+----------+--------------+ CFV      Full           Yes      Yes                                 +---------+---------------+---------+-----------+----------+--------------+ SFJ      Full                                                        +---------+---------------+---------+-----------+----------+--------------+ FV Prox  Full                                                        +---------+---------------+---------+-----------+----------+--------------+  FV Mid   Full                                                        +---------+---------------+---------+-----------+----------+--------------+ FV Distal               Yes      Yes                                 +---------+---------------+---------+-----------+----------+--------------+ PFV      Full                                                        +---------+---------------+---------+-----------+----------+--------------+ POP      Full           Yes      Yes                                 +---------+---------------+---------+-----------+----------+--------------+ PTV      Full                                                        +---------+---------------+---------+-----------+----------+--------------+ PERO                                                  Not visualized +---------+---------------+---------+-----------+----------+--------------+     Summary: BILATERAL: - No evidence of deep vein thrombosis seen in the lower extremities, bilaterally. -   *See table(s) above for measurements and observations. Electronically signed by Monica Martinez MD on 07/23/2019 at 5:31:24 PM.    Final    CT Angio Abd/Pel w/  and/or w/o  Result Date: 07/27/2019 CLINICAL DATA:  54 year old male with history of severe aortic stenosis. Preprocedural study prior to potential transcatheter aortic valve replacement (TAVR) procedure. EXAM: CT ANGIOGRAPHY CHEST, ABDOMEN AND PELVIS TECHNIQUE: Non-contrast CT of the chest was initially obtained. Multidetector CT imaging through the chest, abdomen and pelvis was performed using the standard protocol during bolus administration of intravenous contrast. Multiplanar reconstructed images and MIPs were obtained and reviewed to evaluate the vascular anatomy. CONTRAST:  12m OMNIPAQUE IOHEXOL 350 MG/ML SOLN COMPARISON:  No priors. FINDINGS: CTA CHEST FINDINGS Cardiovascular: Heart size is mildly enlarged. There is no significant pericardial fluid, thickening or pericardial calcification. Aortic atherosclerosis. No definite coronary artery calcifications. Severe thickening calcification of the aortic valve. Dilatation of the pulmonic trunk (3.6 cm in diameter). Mediastinum/Lymph Nodes: No pathologically enlarged mediastinal or hilar lymph nodes. Esophagus is unremarkable in appearance. No axillary lymphadenopathy. Lungs/Pleura: Patchy but diffuse ground-glass attenuation and mild interlobular septal thickening noted throughout the lungs bilaterally, suggesting a background of mild interstitial pulmonary edema. Moderate bilateral pleural effusions lying dependently with some associated passive subsegmental atelectasis in the lower lobes of the lungs  bilaterally. No definite suspicious appearing pulmonary nodules or masses. Musculoskeletal/Soft Tissues: Old healed fracture of the mid sternum with mild posttraumatic deformity. There are no aggressive appearing lytic or blastic lesions noted in the visualized portions of the skeleton. CTA ABDOMEN AND PELVIS FINDINGS Hepatobiliary: Liver has a slightly shrunken appearance and nodular contour, indicative of underlying cirrhosis. No discrete cystic or solid  hepatic lesions. No intra or extrahepatic biliary ductal dilatation. Gallbladder is nearly completely decompressed, and otherwise unremarkable in appearance. Pancreas: No pancreatic mass. No pancreatic ductal dilatation. No pancreatic or peripancreatic fluid collections or inflammatory changes. Spleen: Unremarkable. Adrenals/Urinary Tract: Left kidney and bilateral adrenal glands are normal in appearance. In the lower pole of the right kidney there is an exophytic low-attenuation nonenhancing lesion measuring 8 cm in diameter, compatible with a simple cyst. No hydroureteronephrosis. Amorphous mass-like area of enhancement along the posterior wall of the urinary bladder measuring approximately 6.4 x 2.0 x 2.5 cm (axial image 214 of series 6 and sagittal image 111 of series 9). Stomach/Bowel: Appearance of the stomach is normal. No pathologic dilatation of small bowel or colon. A few scattered colonic diverticulae are noted, without definite surrounding inflammatory changes to suggest an acute diverticulitis (study is limited by presence of small volume of ascites). Normal appendix. Vascular/Lymphatic: Aortic atherosclerosis with vascular findings and measurements pertinent to potential TAVR procedure, as detailed below. No aneurysm or dissection noted in the abdominal or pelvic vasculature. No lymphadenopathy noted in the abdomen or pelvis. Reproductive: Prostate gland and seminal vesicles are unremarkable in appearance. Other: Small volume of ascites.  No pneumoperitoneum. Musculoskeletal: There are no aggressive appearing lytic or blastic lesions noted in the visualized portions of the skeleton. Mild diffuse body wall edema. VASCULAR MEASUREMENTS PERTINENT TO TAVR: AORTA: Minimal Aortic Diameter-16 x 16 mm Severity of Aortic Calcification-mild RIGHT PELVIS: Right Common Iliac Artery - Minimal Diameter-9.0 x 11.1 mm Tortuosity - mild Calcification-minimal Right External Iliac Artery - Minimal Diameter-9.5 x 9.1 mm  Tortuosity - mild Calcification-none Right Common Femoral Artery - Minimal Diameter-6.4 x 8.6 mm Tortuosity - mild Calcification-none LEFT PELVIS: Left Common Iliac Artery - Minimal Diameter-11.6 x 9.9 mm Tortuosity - mild Calcification-minimal Left External Iliac Artery - Minimal Diameter-11.5 x 8.8 mm Tortuosity - mild Calcification-none Left Common Femoral Artery - Minimal Diameter-9.1 x 8.7 mm Tortuosity-mild Calcification-none Review of the MIP images confirms the above findings. IMPRESSION: 1. Vascular findings and measurements pertinent to potential TAVR procedure, as detailed above. 2. Severe thickening calcification of the aortic valve, compatible with reported clinical history of severe aortic stenosis. 3. Cardiomegaly with evidence of mild interstitial pulmonary edema in the lungs and moderate bilateral pleural effusions; imaging findings concerning for congestive heart failure. 4. Amorphous mass-like area of enhancement along the posterior wall of the urinary bladder measuring approximately 6.4 x 2.0 x 2.5 cm. Nonemergent Urologic consultation is strongly recommended in the near future to better evaluate this finding, as the possibility of an infiltrative bladder neoplasm is not excluded. 5. There is also dilatation of the pulmonic trunk (3.6 cm in diameter), concerning for pulmonary arterial hypertension. 6. Morphologic changes in the liver suggesting early cirrhosis. 7. Small volume of ascites. 8. Mild diffuse body wall edema. 9. Additional incidental findings, as above. These results will be called to the ordering clinician or representative by the Radiologist Assistant, and communication documented in the PACS or [employer]. Electronically Signed   By: Vinnie Langton M.D.   On: 07/27/2019 12:47    Phillips Climes M.D on 07/27/2019  at 3:52 PM  Between 7am to 7pm - Pager - [phone]  After 7pm go to [email or web address] - password [facility]  [facility] -  Office  [phone]                                            PROGRESS NOTE                                                                                                                                                                                                             Patient Demographics:    Kris No, is a 54 y.o. male, DOB - [birth year], WYO:[id number]  Admit date - 07/21/2019   Admitting Physician Karmen Bongo, MD  Outpatient Primary MD for the patient is System, Pcp Not In  LOS - 6   Chief Complaint  Patient presents with  . Shortness of Breath  . Leg Swelling       Brief Narrative     Drayden Lukas is a 54 y.o. male with medical history significant of R pleural effusion in April 2021 presenting with SOB and LE edema.    Patient did not follow with PCP for last 6 years, patient with recent work-up as an outpatient and negative for CHF, volume overload with thoracentesis status post drain, as well evidence of liver cirrhosis, and recent diagnosis of left lower extremity cellulitis where he was started on Keflex, ED secondary to dyspnea, evidence of volume overload, as well he tested Covid positive on admission .   Subjective:    Steve Richards today has, No headache, No chest pain, No abdominal pain, overall report is feeling better, dyspnea has improved as well.   Assessment  & Plan :    Principal Problem:   New onset of congestive heart failure (HCC) Active Problems:   Hepatic cirrhosis (HCC)   Pleural effusion on right   Systolic ejection murmur   Cellulitis and abscess of left lower extremity   Hyperglycemia   Obesity, Class III, BMI 40-49.9 (morbid obesity) (HCC)   Acute systolic/diastolic CHF -Did not follow with his PCP for last 6 years, with poor outpatient follow-up. -Evidence of volume overload on imaging, recurrent pleural effusion, elevated BNP and lower extremity edema. -Continue with IV diuresis, he appears to be tolerating 40 mg of IV Lasix twice daily, so far -10 L  since hospital stay, blood pressure on the lower side, but he is asymptomatic,  will give hour-long [place] blood pressure more than 90 . -2D echo significant for low EF 40 to 45%, with grade 2 diastolic CHF, cardiology has been consulted regarding further recommendations. -Blood pressure is persistently on the lower side, there is no room to add any beta-blockers or ACEi/ARB  Severe aortic stenosis -Management per cardiology . -likely contributing to multiple comorbidities, possibly causing cardiac cirrhosis, his lower extremity edema contributing to venous stasis and cellulitis. -Structural heart team input greatly appreciated, plan for imaging this weekend, and cardiac cath on Tuesday.   COVID-19 infection -Patient denies any fever, cough currently, no evidence of pneumonia on imaging, he has no hypoxia, so for now no indication for steroids and remdesivir, but will continue to monitor closely especially with significantly elevated inflammatory markers. -D-dimers elevated at 6 on admission, it is contrary it is currently trending down, tinea with current DVT prophylaxis dose, his venous Doppler negative for DVT .  COVID-19 Labs  Recent Labs    07/25/19 0858 07/26/19 0350  DDIMER 3.26* 3.03*  CRP 3.5* 3.0*    Lab Results  Component Value Date   SARSCOV2NAA POSITIVE (A) 07/21/2019   [place] NEGATIVE 06/23/2019   Liver cirrhosis -Patient denies any history of alcohol abuse in the past, this is most likely related to cardiac cirrhosis versus NASH. -Right upper quadrant ultrasound. -We will DC Aldactone given soft blood pressure -Check acute hepatitis panel  Left lower extremity cellulitis -He appears to be having left lower extremity cellulitis, which did not respond to Keflex, as well there is evidence of venous stasis disease contributing to his infection, as well significant lower extremity edema as well. -Initially on IV vancomycin, patient IV cefazolin, I will change  doxycycline.  Hyperglycemia -A1c is 6.1  Morbid obesity  Hypothyroidism -TSH elevated at 14, with low normal free T4 at 0.65, started on low-dose Synthroid   Code Status : Full  Disposition Plan  :  Status is: Inpatient  Remains inpatient appropriate because:IV treatments appropriate due to intensity of illness or inability to take PO   Dispo: The patient is from: Home              Anticipated d/c is to: Home              Anticipated d/c date is: 3 days              Patient currently is not medically stable to d/c.  Patient still with significant volume overload, requiring further IV diuresis.       Consults  :  Cardiology  Procedures  : None  DVT Prophylaxis  :  [place] lovenox  Lab Results  Component Value Date   PLT 195 07/27/2019    Antibiotics  :   Anti-infectives (From admission, onward)   Start     Dose/Rate Route Frequency Ordered Stop   07/25/19 1530  doxycycline (VIBRA-TABS) tablet 100 mg  Status:  Discontinued     100 mg Oral Every 12 hours 07/25/19 1521 07/27/19 0809   07/22/19 1400  ceFAZolin (ANCEF) IVPB 2g/100 mL premix  Status:  Discontinued     2 g 200 mL/hr over 30 Minutes Intravenous Every 8 hours 07/22/19 1223 07/25/19 1521   07/22/19 0000  vancomycin (VANCOREADY) IVPB 1250 mg/250 mL  Status:  Discontinued     1,250 mg 166.7 mL/hr over 90 Minutes Intravenous Every 12 hours 07/21/19 1141 07/22/19 1223   07/21/19 1145  vancomycin (VANCOREADY) IVPB 2000 mg/400 mL     2,000  mg 200 mL/hr over 120 Minutes Intravenous  Once 07/21/19 1141 07/21/19 1509        Objective:   Vitals:   07/26/19 2135 07/26/19 2159 07/27/19 0558 07/27/19 0730  BP: 101/70  90/66 92/68  Pulse:  88 79 79  Resp:   18 17  Temp:  98.3 F (36.8 C) (!) 97.4 F (36.3 C) 98 F (36.7 C)  TempSrc:  Oral Oral Oral  SpO2:  92% 90% 94%  Weight:    124.2 kg  Height:        Wt Readings from Last 3 Encounters:  07/27/19 124.2 kg  09/24/11 125.2 kg     Intake/Output  Summary (Last 24 hours) at 07/27/2019 1552 Last data filed at 07/27/2019 1453 Gross per 24 hour  Intake 513 ml  Output 4625 ml  Net -4112 ml     Physical Exam  Awake Alert, Oriented X 3, No new F.N deficits, Normal affect Symmetrical Chest wall movement, Good air movement bilaterally, CTAB RRR,No Gallops,Rubs, systolic murmur present, No Parasternal Heave +ve B.Sounds, Abd Soft, No tenderness, No rebound - guarding or rigidity. No Cyanosis, Clubbing, remains with lower extremity edema, appears to be improving, lower extremity with Ace wrap        Data Review:    CBC Recent Labs  Lab 07/23/19 0622 07/24/19 1237 07/25/19 0858 07/26/19 0350 07/27/19 0537  WBC 7.3 7.9 6.9 7.9 8.2  HGB 13.6 14.6 13.7 13.6 13.8  HCT 42.5 45.8 42.9 43.0 43.5  PLT 197 215 193 197 195  MCV 93.6 92.9 93.9 93.5 93.5  MCH 30.0 29.6 30.0 29.6 29.7  MCHC 32.0 31.9 31.9 31.6 31.7  RDW 16.4* 16.5* 16.5* 16.8* 16.8*  LYMPHSABS 0.9 0.9 0.9 1.0 1.0  MONOABS 0.6 0.5 0.4 0.5 0.5  EOSABS 0.1 0.1 0.1 0.1 0.1  BASOSABS 0.1 0.0 0.0 0.0 0.0    Chemistries  Recent Labs  Lab 07/23/19 0622 07/24/19 1237 07/25/19 0858 07/26/19 0350 07/27/19 0537  NA 138 139 138 136 139  K 3.9 4.1 4.2 3.6 4.1  CL 96* 93* 93* 94* 95*  CO2 33* 34* 34* 35* 33*  GLUCOSE 112* 116* 150* 119* 127*  BUN 16 18 16 16 17   CREATININE 0.96 0.95 1.00 0.92 0.93  CALCIUM 8.6* 9.1 8.9 8.7* 9.1  MG 1.8 2.0 2.0 2.0 2.0  AST 27 36 35 36 35  ALT 22 22 19 18 19   ALKPHOS 233* 284* 268* 253* 287*  BILITOT 2.6* 2.8* 2.6* 2.3* 2.9*   ------------------------------------------------------------------------------------------------------------------ No results for input(s): CHOL, HDL, LDLCALC, TRIG, CHOLHDL, LDLDIRECT in the last 72 hours.  Lab Results  Component Value Date   HGBA1C 6.1 (H) 07/21/2019   ------------------------------------------------------------------------------------------------------------------ No results for  input(s): TSH, T4TOTAL, T3FREE, THYROIDAB in the last 72 hours.  Invalid input(s): FREET3 ------------------------------------------------------------------------------------------------------------------ No results for input(s): VITAMINB12, FOLATE, FERRITIN, TIBC, IRON, RETICCTPCT in the last 72 hours.  Coagulation profile No results for input(s): INR, PROTIME in the last 168 hours.  Recent Labs    07/25/19 0858 07/26/19 0350  DDIMER 3.26* 3.03*    Cardiac Enzymes No results for input(s): CKMB, TROPONINI, MYOGLOBIN in the last 168 hours.  Invalid input(s): CK ------------------------------------------------------------------------------------------------------------------    Component Value Date/Time   BNP 1,142.6 (H) 07/21/2019 1316    Inpatient Medications  Scheduled Meds: . aspirin EC  81 mg Oral Daily  . docusate sodium  100 mg Oral BID  . enoxaparin (LOVENOX) injection  65 mg Subcutaneous Q24H  . feeding supplement (  ENSURE ENLIVE)  237 mL Oral Q1500  . feeding supplement (PRO-STAT SUGAR FREE 64)  30 mL Oral Daily  . furosemide  40 mg Intravenous Q12H  . levothyroxine  25 mcg Oral Q0600  . multivitamin with minerals  1 tablet Oral Daily  . saccharomyces boulardii  250 mg Oral BID  . sodium chloride flush  10-40 mL Intracatheter Q12H  . sodium chloride flush  3 mL Intravenous Q12H   Continuous Infusions: . sodium chloride     PRN Meds:.sodium chloride, acetaminophen **OR** acetaminophen, bisacodyl, hydrALAZINE, HYDROcodone-acetaminophen, ondansetron **OR** ondansetron (ZOFRAN) IV, polyethylene glycol, sodium chloride flush, sodium chloride flush  Micro Results Recent Results (from the past 240 hour(s))  Culture, blood (routine x 2)     Status: None   Collection Time: 07/21/19 12:37 PM   Specimen: BLOOD RIGHT HAND  Result Value Ref Range Status   Specimen Description BLOOD RIGHT HAND  Final   Special Requests   Final    BOTTLES DRAWN AEROBIC ONLY Blood Culture  results may not be optimal due to an inadequate volume of blood received in culture bottles   Culture   Final    NO GROWTH 5 DAYS Performed at [facility], [place] [address], [place], [place] [address]    Report Status 07/26/2019 FINAL  Final  SARS Coronavirus 2 by RT PCR (hospital order, performed in [facility] hospital lab) Nasopharyngeal Nasopharyngeal Swab     Status: Abnormal   Collection Time: 07/21/19 12:37 PM   Specimen: Nasopharyngeal Swab  Result Value Ref Range Status   SARS Coronavirus 2 POSITIVE (A) NEGATIVE Final    Comment: RESULT CALLED TO, READ BACK BY AND VERIFIED WITH: Angelina Ok RN 16:45 07/21/19 ([email or web address]) (NOTE) SARS-CoV-2 target nucleic acids are DETECTED SARS-CoV-2 RNA is generally detectable in upper respiratory specimens  during the acute phase of infection.  Positive results are indicative  of the presence of the identified virus, but do not rule out bacterial infection or co-infection with other pathogens not detected by the test.  Clinical correlation with patient history and  other diagnostic information is necessary to determine patient infection status.  The expected result is negative. Fact Sheet for Patients:   StrictlyIdeas.no  Fact Sheet for Healthcare Providers:   BankingDealers.co.za   This test is not yet approved or cleared by the [place] FDA and  has been authorized for detection and/or diagnosis of SARS-CoV-2 by FDA under an Emergency Use Authorization (EUA).  This EUA will remain in effect (meaning this  test can be used) for the duration of  the COVID-19 declaration under Section 564(b)(1) of the Act, 21 U.S.C. section 360-bbb-3(b)(1), unless the authorization is terminated or revoked sooner. Performed at [facility], [place] [address] [place], [place] [address]   Culture, blood (routine x 2)     Status: None   Collection Time: 07/21/19 12:38 PM   Specimen: BLOOD   Result Value Ref Range Status   Specimen Description BLOOD RIGHT ANTECUBITAL  Final   Special Requests   Final    BOTTLES DRAWN AEROBIC AND ANAEROBIC Blood Culture results may not be optimal due to an inadequate volume of blood received in culture bottles   Culture   Final    NO GROWTH 5 DAYS Performed at [facility], [place] [address], [place], [place] [address]    Report Status 07/26/2019 FINAL  Final    Radiology Reports DG Chest 2 View  Result Date: 07/21/2019 CLINICAL DATA:  Chest pain and shortness of breath EXAM: CHEST - 2 VIEW COMPARISON:  06/25/2018 FINDINGS: Congested appearance of vessels with small pleural effusions. Cardiomegaly that is chronic. Negative aortic contours. IMPRESSION: Cardiomegaly with vascular congestion and small pleural effusions. Electronically Signed   By: Monte Fantasia M.D.   On: 07/21/2019 08:42   CT CORONARY MORPH W/CTA COR W/SCORE W/CA W/CM &/OR WO/CM  Addendum Date: 07/27/2019   ADDENDUM REPORT: 07/27/2019 12:22 ADDENDUM: Extracardiac findings are described separately under dictation for contemporaneously obtained CTA chest, abdomen and pelvis performed on 07/25/2019. Please see dictation for that examination for description of relevant extracardiac findings. Electronically Signed   By: Vinnie Langton M.D.   On: 07/27/2019 12:22   Result Date: 07/27/2019 CLINICAL DATA:  54 year old male with severe aortic stenosis being evaluated for a TAVR procedure. EXAM: Cardiac TAVR CT TECHNIQUE: The patient was scanned on a [employer]. A 120 kV retrospective scan was triggered in the descending thoracic aorta at 111 HU's. Gantry rotation speed was 250 msecs and collimation was .6 mm. No beta blockade or nitro were given. The 3D data set was reconstructed in 5% intervals of the R-R cycle. Systolic and diastolic phases were analyzed on a dedicated work station using MPR, MIP and VRT modes. The patient received 80 cc of contrast. FINDINGS: Aortic  Valve: Trileaflet aortic valve with severely calcified and thickened leaflets and only mild calcifications extending into the LVOT. Aorta: Normal size, minimal plaque, no calcifications, no dissection. Sinotubular Junction: 30 x 29 mm Ascending Thoracic Aorta: 35 x 34 mm Aortic Arch: Not visualized. Descending Thoracic Aorta: 22 x 22 mm Sinus of Valsalva Measurements: Non-coronary: 34 mm Right -coronary: 31 mm Left -coronary: 35 mm Coronary Artery Height above Annulus: Left Main: 13.1 mm Right Coronary: 18.2 mm Virtual Basal Annulus Measurements: Maximum/Minimum Diameter: 34.0 x 25.7 mm Mean Diameter: 28.2 mm Perimeter: 94.1 mm Area: 626 mm2 Coronary Arteries: Calcium score is 0. Coronary Arteries:  Normal coronary origin.  Right dominance. RCA is a large dominant artery that gives rise to PDA and PLA. There is no plaque. Left main is a large artery that gives rise to LAD and LCX arteries. Left main has no plaque. LAD is a large vessel that has no plaque. LCX is a non-dominant artery that gives rise to one large OM1 branch. There is no plaque. Optimum Fluoroscopic Angle for Delivery: RAO 0 CRA 0 IMPRESSION: 1. Trileaflet aortic valve with severely calcified and thickened leaflets and only mild calcifications extending into the LVOT. Aortic valve calcium score 7968 consistent with severe aortic stenosis. Annular measurements suitable for delivery of a 29 mm Edwards-SAPIEN 3 valve. 2. Sufficient coronary to annulus distance. 3. Optimum Fluoroscopic Angle for Delivery:  RAO 0 CRA 0. 4. No thrombus in the left atrial appendage. 5. Coronary Arteries: Normal origin. Right dominance. Calcium score is 0. There are only minimal irregularities. 6. Dilated pulmonary artery measuring 35 suggestive of pulmonary hypertension. Electronically Signed: By: Ena Dawley On: 07/26/2019 13:01   CT ANGIO CHEST AORTA W/CM & OR WO/CM  Result Date: 07/27/2019 CLINICAL DATA:  54 year old male with history of severe aortic stenosis.  Preprocedural study prior to potential transcatheter aortic valve replacement (TAVR) procedure. EXAM: CT ANGIOGRAPHY CHEST, ABDOMEN AND PELVIS TECHNIQUE: Non-contrast CT of the chest was initially obtained. Multidetector CT imaging through the chest, abdomen and pelvis was performed using the standard protocol during bolus administration of intravenous contrast. Multiplanar reconstructed images and MIPs were obtained and reviewed to evaluate the vascular  anatomy. CONTRAST:  152m OMNIPAQUE IOHEXOL 350 MG/ML SOLN COMPARISON:  No priors. FINDINGS: CTA CHEST FINDINGS Cardiovascular: Heart size is mildly enlarged. There is no significant pericardial fluid, thickening or pericardial calcification. Aortic atherosclerosis. No definite coronary artery calcifications. Severe thickening calcification of the aortic valve. Dilatation of the pulmonic trunk (3.6 cm in diameter). Mediastinum/Lymph Nodes: No pathologically enlarged mediastinal or hilar lymph nodes. Esophagus is unremarkable in appearance. No axillary lymphadenopathy. Lungs/Pleura: Patchy but diffuse ground-glass attenuation and mild interlobular septal thickening noted throughout the lungs bilaterally, suggesting a background of mild interstitial pulmonary edema. Moderate bilateral pleural effusions lying dependently with some associated passive subsegmental atelectasis in the lower lobes of the lungs bilaterally. No definite suspicious appearing pulmonary nodules or masses. Musculoskeletal/Soft Tissues: Old healed fracture of the mid sternum with mild posttraumatic deformity. There are no aggressive appearing lytic or blastic lesions noted in the visualized portions of the skeleton. CTA ABDOMEN AND PELVIS FINDINGS Hepatobiliary: Liver has a slightly shrunken appearance and nodular contour, indicative of underlying cirrhosis. No discrete cystic or solid hepatic lesions. No intra or extrahepatic biliary ductal dilatation. Gallbladder is nearly completely  decompressed, and otherwise unremarkable in appearance. Pancreas: No pancreatic mass. No pancreatic ductal dilatation. No pancreatic or peripancreatic fluid collections or inflammatory changes. Spleen: Unremarkable. Adrenals/Urinary Tract: Left kidney and bilateral adrenal glands are normal in appearance. In the lower pole of the right kidney there is an exophytic low-attenuation nonenhancing lesion measuring 8 cm in diameter, compatible with a simple cyst. No hydroureteronephrosis. Amorphous mass-like area of enhancement along the posterior wall of the urinary bladder measuring approximately 6.4 x 2.0 x 2.5 cm (axial image 214 of series 6 and sagittal image 111 of series 9). Stomach/Bowel: Appearance of the stomach is normal. No pathologic dilatation of small bowel or colon. A few scattered colonic diverticulae are noted, without definite surrounding inflammatory changes to suggest an acute diverticulitis (study is limited by presence of small volume of ascites). Normal appendix. Vascular/Lymphatic: Aortic atherosclerosis with vascular findings and measurements pertinent to potential TAVR procedure, as detailed below. No aneurysm or dissection noted in the abdominal or pelvic vasculature. No lymphadenopathy noted in the abdomen or pelvis. Reproductive: Prostate gland and seminal vesicles are unremarkable in appearance. Other: Small volume of ascites.  No pneumoperitoneum. Musculoskeletal: There are no aggressive appearing lytic or blastic lesions noted in the visualized portions of the skeleton. Mild diffuse body wall edema. VASCULAR MEASUREMENTS PERTINENT TO TAVR: AORTA: Minimal Aortic Diameter-16 x 16 mm Severity of Aortic Calcification-mild RIGHT PELVIS: Right Common Iliac Artery - Minimal Diameter-9.0 x 11.1 mm Tortuosity - mild Calcification-minimal Right External Iliac Artery - Minimal Diameter-9.5 x 9.1 mm Tortuosity - mild Calcification-none Right Common Femoral Artery - Minimal Diameter-6.4 x 8.6 mm  Tortuosity - mild Calcification-none LEFT PELVIS: Left Common Iliac Artery - Minimal Diameter-11.6 x 9.9 mm Tortuosity - mild Calcification-minimal Left External Iliac Artery - Minimal Diameter-11.5 x 8.8 mm Tortuosity - mild Calcification-none Left Common Femoral Artery - Minimal Diameter-9.1 x 8.7 mm Tortuosity-mild Calcification-none Review of the MIP images confirms the above findings. IMPRESSION: 1. Vascular findings and measurements pertinent to potential TAVR procedure, as detailed above. 2. Severe thickening calcification of the aortic valve, compatible with reported clinical history of severe aortic stenosis. 3. Cardiomegaly with evidence of mild interstitial pulmonary edema in the lungs and moderate bilateral pleural effusions; imaging findings concerning for congestive heart failure. 4. Amorphous mass-like area of enhancement along the posterior wall of the urinary bladder measuring approximately 6.4 x 2.0 x 2.5  cm. Nonemergent Urologic consultation is strongly recommended in the near future to better evaluate this finding, as the possibility of an infiltrative bladder neoplasm is not excluded. 5. There is also dilatation of the pulmonic trunk (3.6 cm in diameter), concerning for pulmonary arterial hypertension. 6. Morphologic changes in the liver suggesting early cirrhosis. 7. Small volume of ascites. 8. Mild diffuse body wall edema. 9. Additional incidental findings, as above. These results will be called to the ordering clinician or representative by the Radiologist Assistant, and communication documented in the PACS or [employer]. Electronically Signed   By: Vinnie Langton M.D.   On: 07/27/2019 12:47   ECHOCARDIOGRAM COMPLETE  Result Date: 07/21/2019    ECHOCARDIOGRAM REPORT   Patient Name:   Steve Richards Date of Exam: 07/21/2019 Medical Rec #:  [id number]   Height:       72.0 in Accession #:    [id number]  Weight:       310.0 lb Date of Birth:  [birth year]   BSA:          2.567 m Patient Age:     45 years    BP:           102/73 mmHg Patient Gender: M           HR:           73 bpm. Exam Location:  Inpatient Procedure: 2D Echo Indications:    CHF-Acute Systolic 381.01 / B51.02  History:        Patient has no prior history of Echocardiogram examinations.                 Risk Factors:Obesity. Pleural effusion on right.  Sonographer:    Vikki Ports Turrentine Referring Phys: [place]  1. Left ventricular ejection fraction, by estimation, is 40 to 45%. The left ventricle has mildly decreased function. The left ventricle demonstrates global hypokinesis. There is moderate left ventricular hypertrophy. Left ventricular diastolic parameters are consistent with Grade II diastolic dysfunction (pseudonormalization).  2. Right ventricular systolic function is mildly reduced. The right ventricular size is normal. There is moderately elevated pulmonary artery systolic pressure.  3. Left atrial size was moderately dilated.  4. The mitral valve is abnormal. Trivial mitral valve regurgitation.  5. Cannot exclude a bicuspid valve. Aortic valve regurgitation is mild. Critical aortic valve stenosis. Aortic valve area, by VTI measures 0.73 cm. Aortic valve mean gradient measures 96.2 mmHg. Peak gradient of 137 mmHg. Aortic valve Vmax measures 5.86 m/s.  6. The inferior vena cava is dilated in size with <50% respiratory variability, suggesting right atrial pressure of 15 mmHg.  7. Moderate pleural effusion in the right lateral region. FINDINGS  Left Ventricle: Left ventricular ejection fraction, by estimation, is 40 to 45%. The left ventricle has mildly decreased function. The left ventricle demonstrates global hypokinesis. The left ventricular internal cavity size was normal in size. There is  moderate left ventricular hypertrophy. Left ventricular diastolic parameters are consistent with Grade II diastolic dysfunction (pseudonormalization). Indeterminate filling pressures. Right Ventricle: The right  ventricular size is normal. No increase in right ventricular wall thickness. Right ventricular systolic function is mildly reduced. There is moderately elevated pulmonary artery systolic pressure. The tricuspid regurgitant velocity is 3.35 m/s, and with an assumed right atrial pressure of 15 mmHg, the estimated right ventricular systolic pressure is 58.5 mmHg. Left Atrium: Left atrial size was moderately dilated. Right Atrium: Right atrial size was normal in size. Pericardium: There is no evidence of  pericardial effusion. Mitral Valve: The mitral valve is abnormal. Mild to moderate mitral annular calcification. Trivial mitral valve regurgitation. Tricuspid Valve: The tricuspid valve is grossly normal. Tricuspid valve regurgitation is mild. Aortic Valve: The aortic valve is tricuspid. . There is moderate thickening and severe calcifcation of the aortic valve. Aortic valve regurgitation is mild. Aortic regurgitation PHT measures 374 msec. Severe aortic stenosis is present. There is moderate thickening of the aortic valve. There is severe calcifcation of the aortic valve. Aortic valve mean gradient measures 96.2 mmHg. Aortic valve peak gradient measures 137.3 mmHg. Aortic valve area, by VTI measures 0.73 cm. Pulmonic Valve: The pulmonic valve was normal in structure. Pulmonic valve regurgitation is not visualized. Aorta: The aortic root and ascending aorta are structurally normal, with no evidence of dilitation. Venous: The inferior vena cava is dilated in size with less than 50% respiratory variability, suggesting right atrial pressure of 15 mmHg. IAS/Shunts: No atrial level shunt detected by color flow Doppler. Additional Comments: There is a moderate pleural effusion in the right lateral region.  LEFT VENTRICLE PLAX 2D LVIDd:         5.50 cm  Diastology LVIDs:         4.40 cm  LV e' lateral:   9.23 cm/s LV PW:         1.40 cm  LV E/e' lateral: 10.2 LV IVS:        1.40 cm  LV e' medial:    7.05 cm/s LVOT diam:      2.50 cm  LV E/e' medial:  13.4 LV SV:         108 LV SV Index:   42 LVOT Area:     4.91 cm  RIGHT VENTRICLE RV S prime:     7.20 cm/s TAPSE (M-mode): 1.2 cm LEFT ATRIUM              Index       RIGHT ATRIUM           Index LA diam:        5.90 cm  2.30 cm/m  RA Area:     22.60 cm LA Vol (A2C):   121.0 ml 47.14 ml/m RA Volume:   68.80 ml  26.80 ml/m LA Vol (A4C):   95.5 ml  37.21 ml/m LA Biplane Vol: 110.0 ml 42.86 ml/m  AORTIC VALVE AV Area (Vmax):    0.82 cm AV Area (Vmean):   0.72 cm AV Area (VTI):     0.73 cm AV Vmax:           585.80 cm/s AV Vmean:          474.000 cm/s AV VTI:            1.480 m AV Peak Grad:      137.3 mmHg AV Mean Grad:      96.2 mmHg LVOT Vmax:         97.30 cm/s LVOT Vmean:        69.800 cm/s LVOT VTI:          0.220 m LVOT/AV VTI ratio: 0.15 AI PHT:            374 msec  AORTA Ao Root diam: 3.00 cm MITRAL VALVE               TRICUSPID VALVE MV Area (PHT): 5.54 cm    TR Peak grad:   44.9 mmHg MV Decel Time: 137 msec    TR Vmax:  335.00 cm/s MV E velocity: 94.30 cm/s MV A velocity: 46.30 cm/s  SHUNTS MV E/A ratio:  2.04        Systemic VTI:  0.22 m                            Systemic Diam: 2.50 cm Lyman Bishop MD Electronically signed by Lyman Bishop MD Signature Date/Time: 07/21/2019/3:42:23 PM    Final    VAS US CAROTID  Result Date: 07/27/2019 Carotid Arterial Duplex Study Indications:       Covid-19. Other Factors:     Critical Aortic Stenosis, CHF, Morbid Obesity. Limitations        Today's exam was limited due to the body habitus of the                    patient. Comparison Study:  No prior study on file for comparison Performing Technologist: Sharion Dove RVS  Examination Guidelines: A complete evaluation includes B-mode imaging, spectral Doppler, color Doppler, and power Doppler as needed of all accessible portions of each vessel. Bilateral testing is considered an integral part of a complete examination. Limited examinations for reoccurring indications may be  performed as noted.  Right Carotid Findings: +----------+--------+--------+--------+------------------+------------------+           PSV cm/sEDV cm/sStenosisPlaque DescriptionComments           +----------+--------+--------+--------+------------------+------------------+ CCA Prox  41      7                                 intimal thickening +----------+--------+--------+--------+------------------+------------------+ CCA Distal51      12                                intimal thickening +----------+--------+--------+--------+------------------+------------------+ ICA Prox  42      12              heterogenous                         +----------+--------+--------+--------+------------------+------------------+ ICA Distal48      15                                                   +----------+--------+--------+--------+------------------+------------------+ ECA       62      10                                                   +----------+--------+--------+--------+------------------+------------------+ +----------+--------+-------+--------+-------------------+           PSV cm/sEDV cmsDescribeArm Pressure (mmHG) +----------+--------+-------+--------+-------------------+ [id number]                                         +----------+--------+-------+--------+-------------------+ +---------+--------+--+--------+-+ VertebralPSV cm/s28EDV cm/s9 +---------+--------+--+--------+-+  Left Carotid Findings: +----------+--------+--------+--------+------------------+------------------+           PSV cm/sEDV cm/sStenosisPlaque DescriptionComments           +----------+--------+--------+--------+------------------+------------------+ CCA Prox  73      12                                intimal thickening +----------+--------+--------+--------+------------------+------------------+ CCA Distal43      9                                 intimal  thickening +----------+--------+--------+--------+------------------+------------------+ ICA Prox  54      11                                                   +----------+--------+--------+--------+------------------+------------------+ ICA Distal67      19                                                   +----------+--------+--------+--------+------------------+------------------+ ECA       53      5                                                    +----------+--------+--------+--------+------------------+------------------+ +----------+--------+--------+--------+-------------------+           PSV cm/sEDV cm/sDescribeArm Pressure (mmHG) +----------+--------+--------+--------+-------------------+ Subclavian113                                         +----------+--------+--------+--------+-------------------+ +---------+--------+--+--------+--+ VertebralPSV cm/s75EDV cm/s20 +---------+--------+--+--------+--+   Summary: Right Carotid: The extracranial vessels were near-normal with only minimal wall                thickening or plaque. Left Carotid: The extracranial vessels were near-normal with only minimal wall               thickening or plaque. Vertebrals:  Bilateral vertebral arteries demonstrate antegrade flow. Subclavians: Normal flow hemodynamics were seen in bilateral subclavian              arteries. *See table(s) above for measurements and observations.  Electronically signed by Servando Snare MD on 07/27/2019 at 12:53:06 AM.    Final    VAS [place] LOWER EXTREMITY VENOUS (DVT)  Result Date: 07/23/2019  Lower Venous DVTStudy Indications: Edema, and elevated ddimer.  Limitations: Body habitus and poor ultrasound/tissue interface. Comparison Study: no prior Performing Technologist: Abram Sander RVS  Examination Guidelines: A complete evaluation includes B-mode imaging, spectral Doppler, color Doppler, and power Doppler as needed of all accessible portions of each  vessel. Bilateral testing is considered an integral part of a complete examination. Limited examinations for reoccurring indications may be performed as noted. The reflux portion of the exam is performed with the patient in reverse Trendelenburg.  +---------+---------------+---------+-----------+----------+--------------+ RIGHT    CompressibilityPhasicitySpontaneityPropertiesThrombus Aging +---------+---------------+---------+-----------+----------+--------------+ CFV      Full           Yes      Yes                                 +---------+---------------+---------+-----------+----------+--------------+  SFJ      Full                                                        +---------+---------------+---------+-----------+----------+--------------+ FV Prox  Full                                                        +---------+---------------+---------+-----------+----------+--------------+ FV Mid                  Yes      Yes                                 +---------+---------------+---------+-----------+----------+--------------+ FV Distal               Yes      Yes                                 +---------+---------------+---------+-----------+----------+--------------+ PFV      Full                                                        +---------+---------------+---------+-----------+----------+--------------+ POP      Full           Yes      Yes                                 +---------+---------------+---------+-----------+----------+--------------+ PTV      Full                                                        +---------+---------------+---------+-----------+----------+--------------+ PERO     Full                                                        +---------+---------------+---------+-----------+----------+--------------+   +---------+---------------+---------+-----------+----------+--------------+ LEFT      CompressibilityPhasicitySpontaneityPropertiesThrombus Aging +---------+---------------+---------+-----------+----------+--------------+ CFV      Full           Yes      Yes                                 +---------+---------------+---------+-----------+----------+--------------+ SFJ      Full                                                        +---------+---------------+---------+-----------+----------+--------------+  FV Prox  Full                                                        +---------+---------------+---------+-----------+----------+--------------+ FV Mid   Full                                                        +---------+---------------+---------+-----------+----------+--------------+ FV Distal               Yes      Yes                                 +---------+---------------+---------+-----------+----------+--------------+ PFV      Full                                                        +---------+---------------+---------+-----------+----------+--------------+ POP      Full           Yes      Yes                                 +---------+---------------+---------+-----------+----------+--------------+ PTV      Full                                                        +---------+---------------+---------+-----------+----------+--------------+ PERO                                                  Not visualized +---------+---------------+---------+-----------+----------+--------------+     Summary: BILATERAL: - No evidence of deep vein thrombosis seen in the lower extremities, bilaterally. -   *See table(s) above for measurements and observations. Electronically signed by Monica Martinez MD on 07/23/2019 at 5:31:24 PM.    Final    CT Angio Abd/Pel w/ and/or w/o  Result Date: 07/27/2019 CLINICAL DATA:  54 year old male with history of severe aortic stenosis. Preprocedural study prior to potential transcatheter  aortic valve replacement (TAVR) procedure. EXAM: CT ANGIOGRAPHY CHEST, ABDOMEN AND PELVIS TECHNIQUE: Non-contrast CT of the chest was initially obtained. Multidetector CT imaging through the chest, abdomen and pelvis was performed using the standard protocol during bolus administration of intravenous contrast. Multiplanar reconstructed images and MIPs were obtained and reviewed to evaluate the vascular anatomy. CONTRAST:  164m OMNIPAQUE IOHEXOL 350 MG/ML SOLN COMPARISON:  No priors. FINDINGS: CTA CHEST FINDINGS Cardiovascular: Heart size is mildly enlarged. There is no significant pericardial fluid, thickening or pericardial calcification. Aortic atherosclerosis. No definite coronary artery calcifications. Severe thickening calcification of the aortic valve. Dilatation of the pulmonic trunk (3.6 cm in diameter). Mediastinum/Lymph  Nodes: No pathologically enlarged mediastinal or hilar lymph nodes. Esophagus is unremarkable in appearance. No axillary lymphadenopathy. Lungs/Pleura: Patchy but diffuse ground-glass attenuation and mild interlobular septal thickening noted throughout the lungs bilaterally, suggesting a background of mild interstitial pulmonary edema. Moderate bilateral pleural effusions lying dependently with some associated passive subsegmental atelectasis in the lower lobes of the lungs bilaterally. No definite suspicious appearing pulmonary nodules or masses. Musculoskeletal/Soft Tissues: Old healed fracture of the mid sternum with mild posttraumatic deformity. There are no aggressive appearing lytic or blastic lesions noted in the visualized portions of the skeleton. CTA ABDOMEN AND PELVIS FINDINGS Hepatobiliary: Liver has a slightly shrunken appearance and nodular contour, indicative of underlying cirrhosis. No discrete cystic or solid hepatic lesions. No intra or extrahepatic biliary ductal dilatation. Gallbladder is nearly completely decompressed, and otherwise unremarkable in appearance.  Pancreas: No pancreatic mass. No pancreatic ductal dilatation. No pancreatic or peripancreatic fluid collections or inflammatory changes. Spleen: Unremarkable. Adrenals/Urinary Tract: Left kidney and bilateral adrenal glands are normal in appearance. In the lower pole of the right kidney there is an exophytic low-attenuation nonenhancing lesion measuring 8 cm in diameter, compatible with a simple cyst. No hydroureteronephrosis. Amorphous mass-like area of enhancement along the posterior wall of the urinary bladder measuring approximately 6.4 x 2.0 x 2.5 cm (axial image 214 of series 6 and sagittal image 111 of series 9). Stomach/Bowel: Appearance of the stomach is normal. No pathologic dilatation of small bowel or colon. A few scattered colonic diverticulae are noted, without definite surrounding inflammatory changes to suggest an acute diverticulitis (study is limited by presence of small volume of ascites). Normal appendix. Vascular/Lymphatic: Aortic atherosclerosis with vascular findings and measurements pertinent to potential TAVR procedure, as detailed below. No aneurysm or dissection noted in the abdominal or pelvic vasculature. No lymphadenopathy noted in the abdomen or pelvis. Reproductive: Prostate gland and seminal vesicles are unremarkable in appearance. Other: Small volume of ascites.  No pneumoperitoneum. Musculoskeletal: There are no aggressive appearing lytic or blastic lesions noted in the visualized portions of the skeleton. Mild diffuse body wall edema. VASCULAR MEASUREMENTS PERTINENT TO TAVR: AORTA: Minimal Aortic Diameter-16 x 16 mm Severity of Aortic Calcification-mild RIGHT PELVIS: Right Common Iliac Artery - Minimal Diameter-9.0 x 11.1 mm Tortuosity - mild Calcification-minimal Right External Iliac Artery - Minimal Diameter-9.5 x 9.1 mm Tortuosity - mild Calcification-none Right Common Femoral Artery - Minimal Diameter-6.4 x 8.6 mm Tortuosity - mild Calcification-none LEFT PELVIS: Left Common  Iliac Artery - Minimal Diameter-11.6 x 9.9 mm Tortuosity - mild Calcification-minimal Left External Iliac Artery - Minimal Diameter-11.5 x 8.8 mm Tortuosity - mild Calcification-none Left Common Femoral Artery - Minimal Diameter-9.1 x 8.7 mm Tortuosity-mild Calcification-none Review of the MIP images confirms the above findings. IMPRESSION: 1. Vascular findings and measurements pertinent to potential TAVR procedure, as detailed above. 2. Severe thickening calcification of the aortic valve, compatible with reported clinical history of severe aortic stenosis. 3. Cardiomegaly with evidence of mild interstitial pulmonary edema in the lungs and moderate bilateral pleural effusions; imaging findings concerning for congestive heart failure. 4. Amorphous mass-like area of enhancement along the posterior wall of the urinary bladder measuring approximately 6.4 x 2.0 x 2.5 cm. Nonemergent Urologic consultation is strongly recommended in the near future to better evaluate this finding, as the possibility of an infiltrative bladder neoplasm is not excluded. 5. There is also dilatation of the pulmonic trunk (3.6 cm in diameter), concerning for pulmonary arterial hypertension. 6. Morphologic changes in the liver suggesting early cirrhosis. 7. Small volume  of ascites. 8. Mild diffuse body wall edema. 9. Additional incidental findings, as above. These results will be called to the ordering clinician or representative by the Radiologist Assistant, and communication documented in the PACS or [employer]. Electronically Signed   By: Vinnie Langton M.D.   On: 07/27/2019 12:47    Phillips Climes M.D on 07/27/2019 at 3:52 PM  Between 7am to 7pm - Pager - [phone]  After 7pm go to [email or web address] - password [facility]  [facility] -  Office  [phone]

## 2019-07-27 NOTE — Progress Notes (Signed)
Progress Note  Patient Name: Steve Richards Date of Encounter: 07/27/2019  Primary Cardiologist: Candee Furbish, MD   Subjective   No complaints  Inpatient Medications    Scheduled Meds: . aspirin EC  81 mg Oral Daily  . docusate sodium  100 mg Oral BID  . enoxaparin (LOVENOX) injection  65 mg Subcutaneous Q24H  . feeding supplement (ENSURE ENLIVE)  237 mL Oral Q1500  . feeding supplement (PRO-STAT SUGAR FREE 64)  30 mL Oral Daily  . furosemide  40 mg Intravenous Q12H  . levothyroxine  25 mcg Oral Q0600  . multivitamin with minerals  1 tablet Oral Daily  . saccharomyces boulardii  250 mg Oral BID  . sodium chloride flush  10-40 mL Intracatheter Q12H  . sodium chloride flush  3 mL Intravenous Q12H   Continuous Infusions: . sodium chloride     PRN Meds: sodium chloride, acetaminophen **OR** acetaminophen, bisacodyl, hydrALAZINE, HYDROcodone-acetaminophen, ondansetron **OR** ondansetron (ZOFRAN) IV, polyethylene glycol, sodium chloride flush, sodium chloride flush   Vital Signs    Vitals:   07/26/19 2135 07/26/19 2159 07/27/19 0558 07/27/19 0730  BP: 101/70  90/66 92/68  Pulse:  88 79 79  Resp:   18 17  Temp:  98.3 F (36.8 C) (!) 97.4 F (36.3 C) 98 F (36.7 C)  TempSrc:  Oral Oral Oral  SpO2:  92% 90% 94%  Weight:    124.2 kg  Height:        Intake/Output Summary (Last 24 hours) at 07/27/2019 1037 Last data filed at 07/27/2019 0814 Gross per 24 hour  Intake 913 ml  Output 4125 ml  Net -3212 ml   Last 3 Weights 07/27/2019 07/26/2019 07/25/2019  Weight (lbs) 273 lb 13 oz 277 lb 5.4 oz 281 lb 15.5 oz  Weight (kg) 124.2 kg 125.8 kg 127.9 kg      Telemetry    SR with occ PVCs - Personally Reviewed  ECG    No new - Personally Reviewed  Physical Exam  PER DR. CHRISTOPHER    Labs    High Sensitivity Troponin:   Recent Labs  Lab 07/21/19 0820 07/21/19 1237  TROPONINIHS 47* 40*      Chemistry Recent Labs  Lab 07/25/19 0858 07/26/19 0350  07/27/19 0537  NA 138 136 139  K 4.2 3.6 4.1  CL 93* 94* 95*  CO2 34* 35* 33*  GLUCOSE 150* 119* 127*  BUN 16 16 17   CREATININE 1.00 0.92 0.93  CALCIUM 8.9 8.7* 9.1  PROT 6.6 6.8 6.8  ALBUMIN 3.1* 3.2* 3.3*  AST 35 36 35  ALT 19 18 19   ALKPHOS 268* 253* 287*  BILITOT 2.6* 2.3* 2.9*  GFRNONAA >60 >60 >60  GFRAA >60 >60 >60  ANIONGAP 11 7 11      Hematology Recent Labs  Lab 07/25/19 0858 07/26/19 0350 07/27/19 0537  WBC 6.9 7.9 8.2  RBC 4.57 4.60 4.65  HGB 13.7 13.6 13.8  HCT 42.9 43.0 43.5  MCV 93.9 93.5 93.5  MCH 30.0 29.6 29.7  MCHC 31.9 31.6 31.7  RDW 16.5* 16.8* 16.8*  PLT 193 197 195    BNP Recent Labs  Lab 07/21/19 1316  BNP 1,142.6*     DDimer  Recent Labs  Lab 07/24/19 1237 07/25/19 0858 07/26/19 0350  DDIMER 3.59* 3.26* 3.03*     Radiology    CT CORONARY MORPH W/CTA COR W/SCORE W/CA W/CM &/OR WO/CM  Result Date: 07/26/2019 CLINICAL DATA:  54 year old male with severe aortic stenosis being  evaluated for a TAVR procedure. EXAM: Cardiac TAVR CT TECHNIQUE: The patient was scanned on a [employer]. A 120 kV retrospective scan was triggered in the descending thoracic aorta at 111 HU's. Gantry rotation speed was 250 msecs and collimation was .6 mm. No beta blockade or nitro were given. The 3D data set was reconstructed in 5% intervals of the R-R cycle. Systolic and diastolic phases were analyzed on a dedicated work station using MPR, MIP and VRT modes. The patient received 80 cc of contrast. FINDINGS: Aortic Valve: Trileaflet aortic valve with severely calcified and thickened leaflets and only mild calcifications extending into the LVOT. Aorta: Normal size, minimal plaque, no calcifications, no dissection. Sinotubular Junction: 30 x 29 mm Ascending Thoracic Aorta: 35 x 34 mm Aortic Arch: Not visualized. Descending Thoracic Aorta: 22 x 22 mm Sinus of Valsalva Measurements: Non-coronary: 34 mm Right -coronary: 31 mm Left -coronary: 35 mm Coronary  Artery Height above Annulus: Left Main: 13.1 mm Right Coronary: 18.2 mm Virtual Basal Annulus Measurements: Maximum/Minimum Diameter: 34.0 x 25.7 mm Mean Diameter: 28.2 mm Perimeter: 94.1 mm Area: 626 mm2 Coronary Arteries: Calcium score is 0. Coronary Arteries:  Normal coronary origin.  Right dominance. RCA is a large dominant artery that gives rise to PDA and PLA. There is no plaque. Left main is a large artery that gives rise to LAD and LCX arteries. Left main has no plaque. LAD is a large vessel that has no plaque. LCX is a non-dominant artery that gives rise to one large OM1 branch. There is no plaque. Optimum Fluoroscopic Angle for Delivery: RAO 0 CRA 0 IMPRESSION: 1. Trileaflet aortic valve with severely calcified and thickened leaflets and only mild calcifications extending into the LVOT. Aortic valve calcium score 7968 consistent with severe aortic stenosis. Annular measurements suitable for delivery of a 29 mm Edwards-SAPIEN 3 valve. 2. Sufficient coronary to annulus distance. 3. Optimum Fluoroscopic Angle for Delivery:  RAO 0 CRA 0. 4. No thrombus in the left atrial appendage. 5. Coronary Arteries: Normal origin. Right dominance. Calcium score is 0. There are only minimal irregularities. 6. Dilated pulmonary artery measuring 35 suggestive of pulmonary hypertension. Electronically Signed   By: Ena Dawley   On: 07/26/2019 13:01   VAS US CAROTID  Result Date: 07/27/2019 Carotid Arterial Duplex Study Indications:       Covid-19. Other Factors:     Critical Aortic Stenosis, CHF, Morbid Obesity. Limitations        Today's exam was limited due to the body habitus of the                    patient. Comparison Study:  No prior study on file for comparison Performing Technologist: Sharion Dove RVS  Examination Guidelines: A complete evaluation includes B-mode imaging, spectral Doppler, color Doppler, and power Doppler as needed of all accessible portions of each vessel. Bilateral testing is considered  an integral part of a complete examination. Limited examinations for reoccurring indications may be performed as noted.  Right Carotid Findings: +----------+--------+--------+--------+------------------+------------------+           PSV cm/sEDV cm/sStenosisPlaque DescriptionComments           +----------+--------+--------+--------+------------------+------------------+ CCA Prox  41      7                                 intimal thickening +----------+--------+--------+--------+------------------+------------------+ CCA Distal51      12  intimal thickening +----------+--------+--------+--------+------------------+------------------+ ICA Prox  42      12              heterogenous                         +----------+--------+--------+--------+------------------+------------------+ ICA Distal48      15                                                   +----------+--------+--------+--------+------------------+------------------+ ECA       62      10                                                   +----------+--------+--------+--------+------------------+------------------+ +----------+--------+-------+--------+-------------------+           PSV cm/sEDV cmsDescribeArm Pressure (mmHG) +----------+--------+-------+--------+-------------------+ [id number]                                         +----------+--------+-------+--------+-------------------+ +---------+--------+--+--------+-+ VertebralPSV cm/s28EDV cm/s9 +---------+--------+--+--------+-+  Left Carotid Findings: +----------+--------+--------+--------+------------------+------------------+           PSV cm/sEDV cm/sStenosisPlaque DescriptionComments           +----------+--------+--------+--------+------------------+------------------+ CCA Prox  73      12                                intimal thickening  +----------+--------+--------+--------+------------------+------------------+ CCA Distal43      9                                 intimal thickening +----------+--------+--------+--------+------------------+------------------+ ICA Prox  54      11                                                   +----------+--------+--------+--------+------------------+------------------+ ICA Distal67      19                                                   +----------+--------+--------+--------+------------------+------------------+ ECA       53      5                                                    +----------+--------+--------+--------+------------------+------------------+ +----------+--------+--------+--------+-------------------+           PSV cm/sEDV cm/sDescribeArm Pressure (mmHG) +----------+--------+--------+--------+-------------------+ Subclavian113                                         +----------+--------+--------+--------+-------------------+ +---------+--------+--+--------+--+  VertebralPSV cm/s75EDV cm/s20 +---------+--------+--+--------+--+   Summary: Right Carotid: The extracranial vessels were near-normal with only minimal wall                thickening or plaque. Left Carotid: The extracranial vessels were near-normal with only minimal wall               thickening or plaque. Vertebrals:  Bilateral vertebral arteries demonstrate antegrade flow. Subclavians: Normal flow hemodynamics were seen in bilateral subclavian              arteries. *See table(s) above for measurements and observations.  Electronically signed by Servando Snare MD on 07/27/2019 at 12:53:06 AM.    Final     Cardiac Studies   ECHO 07/21/19 IMPRESSIONS    1. Left ventricular ejection fraction, by estimation, is 40 to 45%. The  left ventricle has mildly decreased function. The left ventricle  demonstrates global hypokinesis. There is moderate left ventricular  hypertrophy. Left  ventricular diastolic  parameters are consistent with Grade II diastolic dysfunction  (pseudonormalization).  2. Right ventricular systolic function is mildly reduced. The right  ventricular size is normal. There is moderately elevated pulmonary artery  systolic pressure.  3. Left atrial size was moderately dilated.  4. The mitral valve is abnormal. Trivial mitral valve regurgitation.  5. Cannot exclude a bicuspid valve. Aortic valve regurgitation is mild.  Critical aortic valve stenosis. Aortic valve area, by VTI measures 0.73  cm. Aortic valve mean gradient measures 96.2 mmHg. Peak gradient of 137  mmHg. Aortic valve Vmax measures  5.86 m/s.  6. The inferior vena cava is dilated in size with <50% respiratory  variability, suggesting right atrial pressure of 15 mmHg.  7. Moderate pleural effusion in the right lateral region.   FINDINGS  Left Ventricle: Left ventricular ejection fraction, by estimation, is 40  to 45%. The left ventricle has mildly decreased function. The left  ventricle demonstrates global hypokinesis. The left ventricular internal  cavity size was normal in size. There is  moderate left ventricular hypertrophy. Left ventricular diastolic  parameters are consistent with Grade II diastolic dysfunction  (pseudonormalization). Indeterminate filling pressures.   Right Ventricle: The right ventricular size is normal. No increase in  right ventricular wall thickness. Right ventricular systolic function is  mildly reduced. There is moderately elevated pulmonary artery systolic  pressure. The tricuspid regurgitant  velocity is 3.35 m/s, and with an assumed right atrial pressure of 15  mmHg, the estimated right ventricular systolic pressure is 26.3 mmHg.   Left Atrium: Left atrial size was moderately dilated.   Right Atrium: Right atrial size was normal in size.   Pericardium: There is no evidence of pericardial effusion.   Mitral Valve: The mitral valve is  abnormal. Mild to moderate mitral  annular calcification. Trivial mitral valve regurgitation.   Tricuspid Valve: The tricuspid valve is grossly normal. Tricuspid valve  regurgitation is mild.   Aortic Valve: The aortic valve is tricuspid. . There is moderate  thickening and severe calcifcation of the aortic valve. Aortic valve  regurgitation is mild. Aortic regurgitation PHT measures 374 msec. Severe  aortic stenosis is present. There is moderate  thickening of the aortic valve. There is severe calcifcation of the aortic  valve. Aortic valve mean gradient measures 96.2 mmHg. Aortic valve peak  gradient measures 137.3 mmHg. Aortic valve area, by VTI measures 0.73 cm.   Pulmonic Valve: The pulmonic valve was normal in structure. Pulmonic valve  regurgitation is not visualized.  Aorta: The aortic root and ascending aorta are structurally normal, with  no evidence of dilitation.   Venous: The inferior vena cava is dilated in size with less than 50%  respiratory variability, suggesting right atrial pressure of 15 mmHg.   IAS/Shunts: No atrial level shunt detected by color flow Doppler.   Additional Comments: There is a moderate pleural effusion in the right  lateral region.    Patient Profile     54 y.o. male with newly discovered severe critical aortic stenosis symptomatic with acute diastolic heart failure and morbid obesity  Assessment & Plan    Severe symptomatic aortic stenosis -Peak velocity 6 m/s, mean gradient 90-100 -Continue with diuresis, net out 8 L.  He is feeling better.  Less edema in lower extremities but still present. -Patient understands and is certainly willing to work with [place]. Pt had CTs done this weekend and will plan for cardiac cath tomorrow the 25th at 1530.   --Cr is stable normal  Acute diastolic heart failure -We stopped spironolactone yesterday to allow for more blood pressure. -Continue with IV Lasix.  (40 mg BID IV) Hold for systolic blood  pressure less than 95. --neg 17,716 since admit.  Wt down from 140.6 to 124.2  --BP 92/68 P 80s  Lasix to be held for systolic BP <26   Morbid obesity -BMI 38 continue to encourage weight loss.  His last 30 to 40 pounds were likely fluid.  COVID positive 07/21/19 has been asymptomatic        For questions or updates, please contact [facility] Please consult [email or web address] for contact info under        Signed, Cecilie Kicks, NP  07/27/2019, 10:37 AM

## 2019-07-27 NOTE — Telephone Encounter (Signed)
Cheryl from radiology is calling to give a call report on one of Steve Richards's patient's.

## 2019-07-27 NOTE — Progress Notes (Signed)
Urology contacted to review CT images and recommend management of possible bladder mass.  Patient has not yet had UA on this hospital admission.  Will proceed with bedside cystoscopy to evaluate bladder and possible lesion.  Will plan for this on Wednesday.    Full consult note to follow after patient has been seen in next 1-2 days.

## 2019-07-27 NOTE — Progress Notes (Addendum)
Physical Therapy Treatment Patient Details Name: Steve Richards MRN: [id number] DOB: 1965/11/11 Today's Date: 07/27/2019    History of Present Illness 54y.o. male admitted on 07/22/19 for SOB and LE edema.  Dx with acute systolic/diastolic CHF, volume overload, COVID (+), LE dopplers pending to r/o DVT due to elevated D dimer, liver cirrhosis, L LE cellulits .  Pt with significant PMH of  Pleural effusion, cirrhosis of the liver with ascites d/p thoracentesis.  Caridology consulted and also found severe aortic stenosis.  Bil LE dopplers negative for DVT.      PT Comments    Pt was able to walk the hallway with a cane.  Pt continues to have DOE needing standing rest breaks every 150' due to [place].  HR 90-100 and O2 sats stable on RA during mobility.  We discussed best at home exercise and I did some manual work on his sub scap area that is causing some pain.  Pt is due to have a cardiac cath tomorrow.  PT will continue to follow acutely for safe mobility progression.  DGI next session as well as balance poses/exercises.  Follow Up Recommendations  No PT follow up     Equipment Recommendations  Cane    Recommendations for Other Services   NA     Precautions / Restrictions Precautions Precautions: Fall Precaution Comments: weeping bil LE wounds left worse than R    Mobility  Bed Mobility               General bed mobility comments: Pt was OOB in the recliner chair.   Transfers Overall transfer level: Needs assistance Equipment used: None Transfers: Sit to/from Stand Sit to Stand: Supervision         General transfer comment: supervision for safety  Ambulation/Gait Ambulation/Gait assistance: Supervision Gait Distance (Feet): 500 Feet Assistive device: Straight cane Gait Pattern/deviations: Step-through pattern;Staggering left;Staggering right Gait velocity: decreased Gait velocity interpretation: 1.31 - 2.62 ft/sec, indicative of limited community ambulator General Gait  Details: Pt walked with cane, but only really needed support at home on the stairs, same, here, but he demonstrated good gait pattern with cane in R hand.  Standing rest breaks about every 150' due to [place].  HR 90s-100s and O2 sats stable on RA.    Stairs Stairs: Yes Stairs assistance: Min guard Stair Management: No rails;Step to pattern;Forwards;With cane Number of Stairs: 1(x7 reps) General stair comments: Min guard assist for the occasional balance check.  Cues for LE sequencing and cane placement.         Balance Overall balance assessment: Needs assistance Sitting-balance support: Feet supported;Bilateral upper extremity supported Sitting balance-Leahy Scale: Good     Standing balance support: No upper extremity supported Standing balance-Leahy Scale: Good Standing balance comment: for pulling up his sock, pt needed support from the hallway railing, but static balance no problems, dynamic close supervision.                             Cognition Arousal/Alertness: Awake/alert Behavior During Therapy: WFL for tasks assessed/performed Overall Cognitive Status: Within Functional Limits for tasks assessed                                        Exercises Other Exercises Other Exercises: Discussed walking and squats as his best home exercises, to exertion, then stop and rest.  Other Exercises: Did some subscap manual work as pt was having some pain in this area.  Discussed chest opening stretches, scapular retraction exercises.         Pertinent Vitals/Pain Pain Assessment: Faces Faces Pain Scale: Hurts a little bit Pain Location: Left LE Pain Descriptors / Indicators: Aching;Burning Pain Intervention(s): Limited activity within patient's tolerance;Monitored during session;Repositioned          PT Goals (current goals can now be found in the care plan section) Acute Rehab PT Goals Patient Stated Goal: to get some fluid off and get his leg  wounds headed in the right direction Progress towards PT goals: Progressing toward goals    Frequency    Min 3X/week      PT Plan Current plan remains appropriate       AM-PAC PT "6 Clicks" Mobility   Outcome Measure  Help needed turning from your back to your side while in a flat bed without using bedrails?: A Little Help needed moving from lying on your back to sitting on the side of a flat bed without using bedrails?: A Little Help needed moving to and from a bed to a chair (including a wheelchair)?: None Help needed standing up from a chair using your arms (e.g., wheelchair or bedside chair)?: None Help needed to walk in hospital room?: None Help needed climbing 3-5 steps with a railing? : A Little 6 Click Score: 21    End of Session   Activity Tolerance: Patient tolerated treatment well Patient left: in chair;with call bell/phone within reach Nurse Communication: Mobility status PT Visit Diagnosis: Muscle weakness (generalized) (M62.81);Difficulty in walking, not elsewhere classified (R26.2);Pain Pain - Right/Left: Left Pain - part of body: Leg     Time: 1445-1539 PT Time Calculation (min) (ACUTE ONLY): 54 min  Charges:  $Gait Training: 23-37 mins $Self Care/Home Management: 8-22 $Massage: 8-22 mins            Verdene Lennert, PT, DPT  Acute Rehabilitation [phone] pager #[phone] office               07/27/2019, 4:17 PM

## 2019-07-27 NOTE — H&P (View-Only) (Signed)
Progress Note  Patient Name: Steve Richards Date of Encounter: 07/27/2019  Primary Cardiologist: Candee Furbish, MD   Subjective   No complaints  Inpatient Medications    Scheduled Meds: . aspirin EC  81 mg Oral Daily  . docusate sodium  100 mg Oral BID  . enoxaparin (LOVENOX) injection  65 mg Subcutaneous Q24H  . feeding supplement (ENSURE ENLIVE)  237 mL Oral Q1500  . feeding supplement (PRO-STAT SUGAR FREE 64)  30 mL Oral Daily  . furosemide  40 mg Intravenous Q12H  . levothyroxine  25 mcg Oral Q0600  . multivitamin with minerals  1 tablet Oral Daily  . saccharomyces boulardii  250 mg Oral BID  . sodium chloride flush  10-40 mL Intracatheter Q12H  . sodium chloride flush  3 mL Intravenous Q12H   Continuous Infusions: . sodium chloride     PRN Meds: sodium chloride, acetaminophen **OR** acetaminophen, bisacodyl, hydrALAZINE, HYDROcodone-acetaminophen, ondansetron **OR** ondansetron (ZOFRAN) IV, polyethylene glycol, sodium chloride flush, sodium chloride flush   Vital Signs    Vitals:   07/26/19 2135 07/26/19 2159 07/27/19 0558 07/27/19 0730  BP: 101/70  90/66 92/68  Pulse:  88 79 79  Resp:   18 17  Temp:  98.3 F (36.8 C) (!) 97.4 F (36.3 C) 98 F (36.7 C)  TempSrc:  Oral Oral Oral  SpO2:  92% 90% 94%  Weight:    124.2 kg  Height:        Intake/Output Summary (Last 24 hours) at 07/27/2019 1037 Last data filed at 07/27/2019 3825 Gross per 24 hour  Intake 913 ml  Output 4125 ml  Net -3212 ml   Last 3 Weights 07/27/2019 07/26/2019 07/25/2019  Weight (lbs) 273 lb 13 oz 277 lb 5.4 oz 281 lb 15.5 oz  Weight (kg) 124.2 kg 125.8 kg 127.9 kg      Telemetry    SR with occ PVCs - Personally Reviewed  ECG    No new - Personally Reviewed  Physical Exam  PER DR. CHRISTOPHER    Labs    High Sensitivity Troponin:   Recent Labs  Lab 07/21/19 0820 07/21/19 1237  TROPONINIHS 47* 40*      Chemistry Recent Labs  Lab 07/25/19 0858 07/26/19 0350  07/27/19 0537  NA 138 136 139  K 4.2 3.6 4.1  CL 93* 94* 95*  CO2 34* 35* 33*  GLUCOSE 150* 119* 127*  BUN 16 16 17   CREATININE 1.00 0.92 0.93  CALCIUM 8.9 8.7* 9.1  PROT 6.6 6.8 6.8  ALBUMIN 3.1* 3.2* 3.3*  AST 35 36 35  ALT 19 18 19   ALKPHOS 268* 253* 287*  BILITOT 2.6* 2.3* 2.9*  GFRNONAA >60 >60 >60  GFRAA >60 >60 >60  ANIONGAP 11 7 11      Hematology Recent Labs  Lab 07/25/19 0858 07/26/19 0350 07/27/19 0537  WBC 6.9 7.9 8.2  RBC 4.57 4.60 4.65  HGB 13.7 13.6 13.8  HCT 42.9 43.0 43.5  MCV 93.9 93.5 93.5  MCH 30.0 29.6 29.7  MCHC 31.9 31.6 31.7  RDW 16.5* 16.8* 16.8*  PLT 193 197 195    BNP Recent Labs  Lab 07/21/19 1316  BNP 1,142.6*     DDimer  Recent Labs  Lab 07/24/19 1237 07/25/19 0858 07/26/19 0350  DDIMER 3.59* 3.26* 3.03*     Radiology    CT CORONARY MORPH W/CTA COR W/SCORE W/CA W/CM &/OR WO/CM  Result Date: 07/26/2019 CLINICAL DATA:  54 year old male with severe aortic stenosis being  evaluated for a TAVR procedure. EXAM: Cardiac TAVR CT TECHNIQUE: The patient was scanned on a [employer]. A 120 kV retrospective scan was triggered in the descending thoracic aorta at 111 HU's. Gantry rotation speed was 250 msecs and collimation was .6 mm. No beta blockade or nitro were given. The 3D data set was reconstructed in 5% intervals of the R-R cycle. Systolic and diastolic phases were analyzed on a dedicated work station using MPR, MIP and VRT modes. The patient received 80 cc of contrast. FINDINGS: Aortic Valve: Trileaflet aortic valve with severely calcified and thickened leaflets and only mild calcifications extending into the LVOT. Aorta: Normal size, minimal plaque, no calcifications, no dissection. Sinotubular Junction: 30 x 29 mm Ascending Thoracic Aorta: 35 x 34 mm Aortic Arch: Not visualized. Descending Thoracic Aorta: 22 x 22 mm Sinus of Valsalva Measurements: Non-coronary: 34 mm Right -coronary: 31 mm Left -coronary: 35 mm Coronary  Artery Height above Annulus: Left Main: 13.1 mm Right Coronary: 18.2 mm Virtual Basal Annulus Measurements: Maximum/Minimum Diameter: 34.0 x 25.7 mm Mean Diameter: 28.2 mm Perimeter: 94.1 mm Area: 626 mm2 Coronary Arteries: Calcium score is 0. Coronary Arteries:  Normal coronary origin.  Right dominance. RCA is a large dominant artery that gives rise to PDA and PLA. There is no plaque. Left main is a large artery that gives rise to LAD and LCX arteries. Left main has no plaque. LAD is a large vessel that has no plaque. LCX is a non-dominant artery that gives rise to one large OM1 branch. There is no plaque. Optimum Fluoroscopic Angle for Delivery: RAO 0 CRA 0 IMPRESSION: 1. Trileaflet aortic valve with severely calcified and thickened leaflets and only mild calcifications extending into the LVOT. Aortic valve calcium score 7968 consistent with severe aortic stenosis. Annular measurements suitable for delivery of a 29 mm Edwards-SAPIEN 3 valve. 2. Sufficient coronary to annulus distance. 3. Optimum Fluoroscopic Angle for Delivery:  RAO 0 CRA 0. 4. No thrombus in the left atrial appendage. 5. Coronary Arteries: Normal origin. Right dominance. Calcium score is 0. There are only minimal irregularities. 6. Dilated pulmonary artery measuring 35 suggestive of pulmonary hypertension. Electronically Signed   By: Ena Dawley   On: 07/26/2019 13:01   VAS US CAROTID  Result Date: 07/27/2019 Carotid Arterial Duplex Study Indications:       Covid-19. Other Factors:     Critical Aortic Stenosis, CHF, Morbid Obesity. Limitations        Today's exam was limited due to the body habitus of the                    patient. Comparison Study:  No prior study on file for comparison Performing Technologist: Sharion Dove RVS  Examination Guidelines: A complete evaluation includes B-mode imaging, spectral Doppler, color Doppler, and power Doppler as needed of all accessible portions of each vessel. Bilateral testing is considered  an integral part of a complete examination. Limited examinations for reoccurring indications may be performed as noted.  Right Carotid Findings: +----------+--------+--------+--------+------------------+------------------+           PSV cm/sEDV cm/sStenosisPlaque DescriptionComments           +----------+--------+--------+--------+------------------+------------------+ CCA Prox  41      7                                 intimal thickening +----------+--------+--------+--------+------------------+------------------+ CCA Distal51      12  intimal thickening +----------+--------+--------+--------+------------------+------------------+ ICA Prox  42      12              heterogenous                         +----------+--------+--------+--------+------------------+------------------+ ICA Distal48      15                                                   +----------+--------+--------+--------+------------------+------------------+ ECA       62      10                                                   +----------+--------+--------+--------+------------------+------------------+ +----------+--------+-------+--------+-------------------+           PSV cm/sEDV cmsDescribeArm Pressure (mmHG) +----------+--------+-------+--------+-------------------+ [id number]                                         +----------+--------+-------+--------+-------------------+ +---------+--------+--+--------+-+ VertebralPSV cm/s28EDV cm/s9 +---------+--------+--+--------+-+  Left Carotid Findings: +----------+--------+--------+--------+------------------+------------------+           PSV cm/sEDV cm/sStenosisPlaque DescriptionComments           +----------+--------+--------+--------+------------------+------------------+ CCA Prox  73      12                                intimal thickening  +----------+--------+--------+--------+------------------+------------------+ CCA Distal43      9                                 intimal thickening +----------+--------+--------+--------+------------------+------------------+ ICA Prox  54      11                                                   +----------+--------+--------+--------+------------------+------------------+ ICA Distal67      19                                                   +----------+--------+--------+--------+------------------+------------------+ ECA       53      5                                                    +----------+--------+--------+--------+------------------+------------------+ +----------+--------+--------+--------+-------------------+           PSV cm/sEDV cm/sDescribeArm Pressure (mmHG) +----------+--------+--------+--------+-------------------+ Subclavian113                                         +----------+--------+--------+--------+-------------------+ +---------+--------+--+--------+--+  VertebralPSV cm/s75EDV cm/s20 +---------+--------+--+--------+--+   Summary: Right Carotid: The extracranial vessels were near-normal with only minimal wall                thickening or plaque. Left Carotid: The extracranial vessels were near-normal with only minimal wall               thickening or plaque. Vertebrals:  Bilateral vertebral arteries demonstrate antegrade flow. Subclavians: Normal flow hemodynamics were seen in bilateral subclavian              arteries. *See table(s) above for measurements and observations.  Electronically signed by Servando Snare MD on 07/27/2019 at 12:53:06 AM.    Final     Cardiac Studies   ECHO 07/21/19 IMPRESSIONS    1. Left ventricular ejection fraction, by estimation, is 40 to 45%. The  left ventricle has mildly decreased function. The left ventricle  demonstrates global hypokinesis. There is moderate left ventricular  hypertrophy. Left  ventricular diastolic  parameters are consistent with Grade II diastolic dysfunction  (pseudonormalization).  2. Right ventricular systolic function is mildly reduced. The right  ventricular size is normal. There is moderately elevated pulmonary artery  systolic pressure.  3. Left atrial size was moderately dilated.  4. The mitral valve is abnormal. Trivial mitral valve regurgitation.  5. Cannot exclude a bicuspid valve. Aortic valve regurgitation is mild.  Critical aortic valve stenosis. Aortic valve area, by VTI measures 0.73  cm. Aortic valve mean gradient measures 96.2 mmHg. Peak gradient of 137  mmHg. Aortic valve Vmax measures  5.86 m/s.  6. The inferior vena cava is dilated in size with <50% respiratory  variability, suggesting right atrial pressure of 15 mmHg.  7. Moderate pleural effusion in the right lateral region.   FINDINGS  Left Ventricle: Left ventricular ejection fraction, by estimation, is 40  to 45%. The left ventricle has mildly decreased function. The left  ventricle demonstrates global hypokinesis. The left ventricular internal  cavity size was normal in size. There is  moderate left ventricular hypertrophy. Left ventricular diastolic  parameters are consistent with Grade II diastolic dysfunction  (pseudonormalization). Indeterminate filling pressures.   Right Ventricle: The right ventricular size is normal. No increase in  right ventricular wall thickness. Right ventricular systolic function is  mildly reduced. There is moderately elevated pulmonary artery systolic  pressure. The tricuspid regurgitant  velocity is 3.35 m/s, and with an assumed right atrial pressure of 15  mmHg, the estimated right ventricular systolic pressure is 65.6 mmHg.   Left Atrium: Left atrial size was moderately dilated.   Right Atrium: Right atrial size was normal in size.   Pericardium: There is no evidence of pericardial effusion.   Mitral Valve: The mitral valve is  abnormal. Mild to moderate mitral  annular calcification. Trivial mitral valve regurgitation.   Tricuspid Valve: The tricuspid valve is grossly normal. Tricuspid valve  regurgitation is mild.   Aortic Valve: The aortic valve is tricuspid. . There is moderate  thickening and severe calcifcation of the aortic valve. Aortic valve  regurgitation is mild. Aortic regurgitation PHT measures 374 msec. Severe  aortic stenosis is present. There is moderate  thickening of the aortic valve. There is severe calcifcation of the aortic  valve. Aortic valve mean gradient measures 96.2 mmHg. Aortic valve peak  gradient measures 137.3 mmHg. Aortic valve area, by VTI measures 0.73 cm.   Pulmonic Valve: The pulmonic valve was normal in structure. Pulmonic valve  regurgitation is not visualized.  Aorta: The aortic root and ascending aorta are structurally normal, with  no evidence of dilitation.   Venous: The inferior vena cava is dilated in size with less than 50%  respiratory variability, suggesting right atrial pressure of 15 mmHg.   IAS/Shunts: No atrial level shunt detected by color flow Doppler.   Additional Comments: There is a moderate pleural effusion in the right  lateral region.    Patient Profile     54 y.o. male with newly discovered severe critical aortic stenosis symptomatic with acute diastolic heart failure and morbid obesity  Assessment & Plan    Severe symptomatic aortic stenosis -Peak velocity 6 m/s, mean gradient 90-100 -Continue with diuresis, net out 8 L.  He is feeling better.  Less edema in lower extremities but still present. -Patient understands and is certainly willing to work with [place]. Pt had CTs done this weekend and will plan for cardiac cath tomorrow the 25th at 1530.   --Cr is stable normal  Acute diastolic heart failure -We stopped spironolactone yesterday to allow for more blood pressure. -Continue with IV Lasix.  (40 mg BID IV) Hold for systolic blood  pressure less than 95. --neg 17,716 since admit.  Wt down from 140.6 to 124.2  --BP 92/68 P 80s  Lasix to be held for systolic BP <68   Morbid obesity -BMI 38 continue to encourage weight loss.  His last 30 to 40 pounds were likely fluid.  COVID positive 07/21/19 has been asymptomatic        For questions or updates, please contact [place] Please consult [email or web address] for contact info under        Signed, Cecilie Kicks, NP  07/27/2019, 10:37 AM

## 2019-07-27 NOTE — Telephone Encounter (Signed)
Phone call from [place] with [facility] - Pt had  CT Angio - TAVR which shows Amorphous mass like area of enhancement along the posterior wall of urinary bladder.  Non emergent urology consult strongly recommended.  Will forward information to ordering provider and RN for review and recommendation.

## 2019-07-27 NOTE — Consult Note (Signed)
I have been asked to see the patient by Dr. Phillips Climes, for evaluation and management of bladder lesion on imaging.    History of present illness: 54 year old man currently admitted with severe aortic stenosis and CHF found to have incidental bladder lesion on imaging done for TAVR work-up.  Patient denies significant voiding difficulty.  He has been told he has kidney stones in the past but never had surgery for them before.  Urinalysis today negative for hematuria or signs of infection.  Patient has had scrotal/penile edema that has been improving with diuresis.  Review of systems: A 12 point comprehensive review of systems was obtained and is negative unless otherwise stated in the history of present illness.  Patient Active Problem List   Diagnosis Date Noted  . New onset of congestive heart failure ([place]) 07/21/2019  . Cellulitis and abscess of left lower extremity 07/21/2019  . Hyperglycemia 07/21/2019  . Obesity, Class III, BMI 40-49.9 (morbid obesity) ([place]) 07/21/2019  . Ascites 07/15/2019  . Hepatic cirrhosis ([place]) 07/15/2019  . Pleural effusion on right 07/15/2019  . Left kidney mass 07/15/2019  . Splenomegaly 07/15/2019  . Systolic ejection murmur 44/81/8563    No current facility-administered medications on file prior to encounter.   Current Outpatient Medications on File Prior to Encounter  Medication Sig Dispense Refill  . cephALEXin (KEFLEX) 500 MG capsule Take 500 mg by mouth every 8 (eight) hours.    . furosemide (LASIX) 40 MG tablet Take 40 mg by mouth daily.    . Multiple Vitamin (MULTIVITAMIN) tablet Take 1 tablet by mouth daily.      Past Medical History:  Diagnosis Date  . Chronic combined systolic (congestive) and diastolic (congestive) heart failure ([place])   . Cirrhosis of liver with ascites ([place])   . Critical aortic valve stenosis   . Morbid obesity ([place])   . Pleural effusion     Past Surgical History:  Procedure Laterality Date  . IR THORACENTESIS  ASP PLEURAL SPACE W/IMG GUIDE  06/25/2019    Social History   Tobacco Use  . Smoking status: Never Smoker  . Smokeless tobacco: Never Used  Substance Use Topics  . Alcohol use: Yes    Comment: rarely  . Drug use: Never    Family History  Problem Relation Age of Onset  . Osteoporosis Mother   . Other Father        died in a hurricane    PE: Vitals:   07/26/19 2159 07/27/19 0558 07/27/19 0730 07/27/19 1628  BP:  90/66 92/68 91/73   Pulse: 88 79 79 79  Resp:  18 17 18   Temp: 98.3 F (36.8 C) (!) 97.4 F (36.3 C) 98 F (36.7 C) 97.9 F (36.6 C)  TempSrc: Oral Oral Oral Oral  SpO2: 92% 90% 94% 97%  Weight:   124.2 kg   Height:       Patient appears to be in no acute distress  patient is alert and oriented x3 Atraumatic normocephalic head No cervical or supraclavicular lymphadenopathy appreciated No increased work of breathing, no audible wheezes/rhonchi Regular sinus rhythm/rate Abdomen is soft, nontender, nondistended, suprapubic tenderness Lower extremities are symmetric with bilateral lower extremity edema Penis with edema and redundant foreskin covering glans, scrotal skin edema Grossly neurologically intact No identifiable skin lesions  Recent Labs    07/25/19 0858 07/26/19 0350 07/27/19 0537  WBC 6.9 7.9 8.2  HGB 13.7 13.6 13.8  HCT 42.9 43.0 43.5   Recent Labs    07/25/19 0858  07/26/19 0350 07/27/19 0537  NA 138 136 139  K 4.2 3.6 4.1  CL 93* 94* 95*  CO2 34* 35* 33*  GLUCOSE 150* 119* 127*  BUN 16 16 17   CREATININE 1.00 0.92 0.93  CALCIUM 8.9 8.7* 9.1   No results for input(s): LABPT, INR in the last 72 hours. No results for input(s): LABURIN in the last 72 hours. Results for orders placed or performed during the hospital encounter of 07/21/19  Culture, blood (routine x 2)     Status: None   Collection Time: 07/21/19 12:37 PM   Specimen: BLOOD RIGHT HAND  Result Value Ref Range Status   Specimen Description BLOOD RIGHT HAND  Final    Special Requests   Final    BOTTLES DRAWN AEROBIC ONLY Blood Culture results may not be optimal due to an inadequate volume of blood received in culture bottles   Culture   Final    NO GROWTH 5 DAYS Performed at [facility], [place] [address], [place], [place] [address]    Report Status 07/26/2019 FINAL  Final  SARS Coronavirus 2 by RT PCR (hospital order, performed in [facility] hospital lab) Nasopharyngeal Nasopharyngeal Swab     Status: Abnormal   Collection Time: 07/21/19 12:37 PM   Specimen: Nasopharyngeal Swab  Result Value Ref Range Status   SARS Coronavirus 2 POSITIVE (A) NEGATIVE Final    Comment: RESULT CALLED TO, READ BACK BY AND VERIFIED WITH: Angelina Ok RN 16:45 07/21/19 ([email or web address]) (NOTE) SARS-CoV-2 target nucleic acids are DETECTED SARS-CoV-2 RNA is generally detectable in upper respiratory specimens  during the acute phase of infection.  Positive results are indicative  of the presence of the identified virus, but do not rule out bacterial infection or co-infection with other pathogens not detected by the test.  Clinical correlation with patient history and  other diagnostic information is necessary to determine patient infection status.  The expected result is negative. Fact Sheet for Patients:   StrictlyIdeas.no  Fact Sheet for Healthcare Providers:   BankingDealers.co.za   This test is not yet approved or cleared by the [place] FDA and  has been authorized for detection and/or diagnosis of SARS-CoV-2 by FDA under an Emergency Use Authorization (EUA).  This EUA will remain in effect (meaning this  test can be used) for the duration of  the COVID-19 declaration under Section 564(b)(1) of the Act, 21 U.S.C. section 360-bbb-3(b)(1), unless the authorization is terminated or revoked sooner. Performed at [facility], [place] [address], [place], [place] [address]   Culture, blood (routine x 2)      Status: None   Collection Time: 07/21/19 12:38 PM   Specimen: BLOOD  Result Value Ref Range Status   Specimen Description BLOOD RIGHT ANTECUBITAL  Final   Special Requests   Final    BOTTLES DRAWN AEROBIC AND ANAEROBIC Blood Culture results may not be optimal due to an inadequate volume of blood received in culture bottles   Culture   Final    NO GROWTH 5 DAYS Performed at [facility], [place] [address], [place], [place] [address]    Report Status 07/26/2019 FINAL  Final    Imaging: CT angio abdomen and pelvis 07/27/2019 IMPRESSION: 1. Vascular findings and measurements pertinent to potential TAVR procedure, as detailed above. 2. Severe thickening calcification of the aortic valve, compatible with reported clinical history of severe aortic stenosis. 3. Cardiomegaly with evidence of mild interstitial pulmonary edema in the lungs and  moderate bilateral pleural effusions; imaging findings concerning for congestive heart failure. 4. Amorphous mass-like area of enhancement along the posterior wall of the urinary bladder measuring approximately 6.4 x 2.0 x 2.5 cm. Nonemergent Urologic consultation is strongly recommended in the near future to better evaluate this finding, as the possibility of an infiltrative bladder neoplasm is not excluded. 5. There is also dilatation of the pulmonic trunk (3.6 cm in diameter), concerning for pulmonary arterial hypertension. 6. Morphologic changes in the liver suggesting early cirrhosis. 7. Small volume of ascites. 8. Mild diffuse body wall edema. 9. Additional incidental findings, as above.  Imp: 54 year old man with severe aortic stenosis currently undergoing work-up for TAVR found to have a 6 cm x 2 cm masslike enhancement of the posterior bladder wall on imaging.  Recommendations: -Patient is scheduled for cardiac catheterization tomorrow however would like to perform cystoscopy prior to TAVR in the event that he has a bladder mass that  needs biopsy or resection -Plan on bedside cystoscopy on 07/29/2019 -I discussed the CT findings with the patient and that it is difficult to say what is in his bladder without looking inside, we talked about possibility of prostatic growth into the bladder versus bladder mass -Also recommend setting PSA blood test for prostate cancer screening  Thank you for involving me in this patient's care, I will continue to follow along.Please page with any further questions or concerns. Raissa Dam D Khamille Beynon

## 2019-07-28 ENCOUNTER — Encounter (HOSPITAL_COMMUNITY)
Admission: EM | Disposition: A | Discharge status: Home Health | Payer: Self-pay | Source: Home / Self Care | Attending: Surgery

## 2019-07-28 DIAGNOSIS — I350 Nonrheumatic aortic (valve) stenosis: Secondary | ICD-10-CM

## 2019-07-28 HISTORY — PX: RIGHT/LEFT HEART CATH AND CORONARY ANGIOGRAPHY: CATH118266

## 2019-07-28 LAB — CBC WITH DIFFERENTIAL/PLATELET
Abs Immature Granulocytes: 0.09 10*3/uL — ABNORMAL HIGH (ref 0.00–0.07)
Basophils Absolute: 0.1 10*3/uL (ref 0.0–0.1)
Basophils Relative: 1 %
Eosinophils Absolute: 0.1 10*3/uL (ref 0.0–0.5)
Eosinophils Relative: 2 %
HCT: 42.7 % (ref 39.0–52.0)
Hemoglobin: 13.5 g/dL (ref 13.0–17.0)
Immature Granulocytes: 1 %
Lymphocytes Relative: 13 %
Lymphs Abs: 1.1 10*3/uL (ref 0.7–4.0)
MCH: 29.6 pg (ref 26.0–34.0)
MCHC: 31.6 g/dL (ref 30.0–36.0)
MCV: 93.6 fL (ref 80.0–100.0)
Monocytes Absolute: 0.6 10*3/uL (ref 0.1–1.0)
Monocytes Relative: 7 %
Neutro Abs: 6.3 10*3/uL (ref 1.7–7.7)
Neutrophils Relative %: 76 %
Platelets: 204 10*3/uL (ref 150–400)
RBC: 4.56 MIL/uL (ref 4.22–5.81)
RDW: 16.4 % — ABNORMAL HIGH (ref 11.5–15.5)
WBC: 8.3 10*3/uL (ref 4.0–10.5)
nRBC: 0 % (ref 0.0–0.2)

## 2019-07-28 LAB — COMPREHENSIVE METABOLIC PANEL
ALT: 20 U/L (ref 0–44)
AST: 35 U/L (ref 15–41)
Albumin: 3.2 g/dL — ABNORMAL LOW (ref 3.5–5.0)
Alkaline Phosphatase: 273 U/L — ABNORMAL HIGH (ref 38–126)
Anion gap: 10 (ref 5–15)
BUN: 19 mg/dL (ref 6–20)
CO2: 32 mmol/L (ref 22–32)
Calcium: 8.9 mg/dL (ref 8.9–10.3)
Chloride: 94 mmol/L — ABNORMAL LOW (ref 98–111)
Creatinine, Ser: 0.96 mg/dL (ref 0.61–1.24)
GFR calc Af Amer: >60 mL/min (ref >60)
GFR calc non Af Amer: >60 mL/min (ref >60)
Glucose, Bld: 119 mg/dL — ABNORMAL HIGH (ref 70–99)
Potassium: 4.2 mmol/L (ref 3.5–5.1)
Sodium: 136 mmol/L (ref 135–145)
Total Bilirubin: 2.8 mg/dL — ABNORMAL HIGH (ref 0.3–1.2)
Total Protein: 6.9 g/dL (ref 6.5–8.1)

## 2019-07-28 LAB — MAGNESIUM: Magnesium: 1.9 mg/dL (ref 1.7–2.4)

## 2019-07-28 SURGERY — RIGHT/LEFT HEART CATH AND CORONARY ANGIOGRAPHY
Anesthesia: LOCAL

## 2019-07-28 MED ORDER — HEPARIN SODIUM (PORCINE) 1000 UNIT/ML IJ SOLN
INTRAMUSCULAR | Status: DC | PRN
  Administered 2019-07-28: 6000 [IU] via INTRAVENOUS

## 2019-07-28 MED ORDER — SODIUM CHLORIDE 0.9 % IV SOLN: 250.0000 mL | INTRAVENOUS | Status: DC | PRN

## 2019-07-28 MED ORDER — SODIUM CHLORIDE 0.9% FLUSH: 3.0000 mL | INTRAVENOUS | Status: DC | PRN

## 2019-07-28 MED ORDER — ENOXAPARIN SODIUM 60 MG/0.6ML ~~LOC~~ SOLN
60.0000 mg | SUBCUTANEOUS | Status: DC
  Administered 2019-07-28 – 2019-08-05 (×9): 60 mg via SUBCUTANEOUS
  Filled 2019-07-28 (×9): qty 0.6

## 2019-07-28 MED ORDER — HEPARIN (PORCINE) IN NACL 1000-0.9 UT/500ML-% IV SOLN
INTRAVENOUS | Status: AC
  Filled 2019-07-28: qty 1000

## 2019-07-28 MED ORDER — FENTANYL CITRATE (PF) 100 MCG/2ML IJ SOLN
INTRAMUSCULAR | Status: DC | PRN
  Administered 2019-07-28 (×2): 25 ug via INTRAVENOUS

## 2019-07-28 MED ORDER — IOHEXOL 350 MG/ML SOLN
INTRAVENOUS | Status: DC | PRN
  Administered 2019-07-28: 60 mL

## 2019-07-28 MED ORDER — HEPARIN SODIUM (PORCINE) 1000 UNIT/ML IJ SOLN
INTRAMUSCULAR | Status: AC
  Filled 2019-07-28: qty 1

## 2019-07-28 MED ORDER — VERAPAMIL HCL 2.5 MG/ML IV SOLN
INTRAVENOUS | Status: AC
  Filled 2019-07-28: qty 2

## 2019-07-28 MED ORDER — SODIUM CHLORIDE 0.9 % IV SOLN: INTRAVENOUS | Status: DC

## 2019-07-28 MED ORDER — VERAPAMIL HCL 2.5 MG/ML IV SOLN
INTRAVENOUS | Status: DC | PRN
  Administered 2019-07-28: 10 mL via INTRA_ARTERIAL

## 2019-07-28 MED ORDER — LABETALOL HCL 5 MG/ML IV SOLN: 10.0000 mg | INTRAVENOUS | Status: AC | PRN

## 2019-07-28 MED ORDER — SODIUM CHLORIDE 0.9% FLUSH
3.0000 mL | Freq: Two times a day (BID) | INTRAVENOUS | Status: DC
  Administered 2019-07-29: 3 mL via INTRAVENOUS

## 2019-07-28 MED ORDER — ASPIRIN 81 MG PO CHEW
81.0000 mg | CHEWABLE_TABLET | ORAL | Status: AC
  Administered 2019-07-28: 81 mg via ORAL
  Filled 2019-07-28: qty 1

## 2019-07-28 MED ORDER — LIDOCAINE HCL (PF) 1 % IJ SOLN
INTRAMUSCULAR | Status: AC
  Filled 2019-07-28: qty 30

## 2019-07-28 MED ORDER — MIDAZOLAM HCL 2 MG/2ML IJ SOLN
INTRAMUSCULAR | Status: AC
  Filled 2019-07-28: qty 2

## 2019-07-28 MED ORDER — HEPARIN (PORCINE) IN NACL 1000-0.9 UT/500ML-% IV SOLN
INTRAVENOUS | Status: DC | PRN
  Administered 2019-07-28 (×2): 500 mL

## 2019-07-28 MED ORDER — SODIUM CHLORIDE 0.9 % IV SOLN
INTRAVENOUS | Status: AC | PRN
  Administered 2019-07-28: 10 mL/h via INTRAVENOUS

## 2019-07-28 MED ORDER — MIDAZOLAM HCL 2 MG/2ML IJ SOLN
INTRAMUSCULAR | Status: DC | PRN
  Administered 2019-07-28 (×2): 1 mg via INTRAVENOUS

## 2019-07-28 MED ORDER — FENTANYL CITRATE (PF) 100 MCG/2ML IJ SOLN
INTRAMUSCULAR | Status: AC
  Filled 2019-07-28: qty 2

## 2019-07-28 MED ORDER — LIDOCAINE HCL (PF) 1 % IJ SOLN
INTRAMUSCULAR | Status: DC | PRN
  Administered 2019-07-28 (×2): 2 mL

## 2019-07-28 SURGICAL SUPPLY — 11 items

## 2019-07-28 NOTE — Progress Notes (Signed)
Nutrition Follow-up  DOCUMENTATION CODES:   Morbid obesity  INTERVENTION:  Once diet advances,   Continue Ensure Enlive po once daily, each supplement provides 350 kcal and 20 grams of protein.  Continue 30 ml Prostat po once daily, each supplement provides 100 kcal and 15 grams of protein.   NUTRITION DIAGNOSIS:   Increased nutrient needs related to chronic illness(cirrhosis) as evidenced by estimated needs; ongoing  GOAL:   Patient will meet greater than or equal to 90% of their needs; progressing  MONITOR:   PO intake, Supplement acceptance, Skin, Weight trends, Labs, I & O's  REASON FOR ASSESSMENT:   Consult Assessment of nutrition requirement/status  ASSESSMENT:   54 y.o. male with medical history significant of R pleural effusion in April 2021, cirrhosis with ascites s/p thoracentesis presenting with SOB and LE edema. COVID positive. Pt with acute systolic/diastolic CHF.  RD working remotely.   Pt with severe aortic stenosis and undergoing cardiac cath today. Meal completion previously 100% prior to NPO status for procedure today. Recommend continuation of nutritional supplements once diet advances.   Labs and medications reviewed.   Diet Order:   Diet Order            Diet NPO time specified Except for: Sips with Meds  Diet effective now              EDUCATION NEEDS:   Not appropriate for education at this time  Skin:  Skin Assessment: Reviewed RN Assessment Skin Integrity Issues:: Other (Comment) Other: Left lower extremity cellulitis  Last BM:  5/22  Height:   Ht Readings from Last 1 Encounters:  07/21/19 6' (1.829 m)    Weight:   Wt Readings from Last 1 Encounters:  07/28/19 123.7 kg    BMI:  Body mass index is 36.99 kg/m.  Estimated Nutritional Needs:   Kcal:  2000-2200  Protein:  120-130 grams  Fluid:  1.5 L/day   Corrin Parker, MS, RD, LDN RD pager number/after hours weekend pager number on Amion.

## 2019-07-28 NOTE — Progress Notes (Addendum)
PROGRESS NOTE                                                                                                                                                                                                             Patient Demographics:    Steve Richards, is a 54 y.o. male, DOB - February 24, 1966, ZOX:[id number]  Admit date - 07/21/2019   Admitting Physician Karmen Bongo, MD  Outpatient Primary MD for the patient is System, Pcp Not In  LOS - 7   Chief Complaint  Patient presents with  . Shortness of Breath  . Leg Swelling       Brief Narrative     Steve Richards is a 54 y.o. male with medical history significant of R pleural effusion in April 2021 presenting with SOB and LE edema.    Patient did not follow with PCP for last 6 years, patient with recent work-up as an outpatient and negative for CHF, volume overload with thoracentesis status post drain, as well evidence of liver cirrhosis, and recent diagnosis of left lower extremity cellulitis where he was started on Keflex, ED secondary to dyspnea, evidence of volume overload, as well he tested Covid positive on admission .  His work-up was significant for volume overload, with severe aortic stenosis, acute systolic/diastolic CHF, he has been seen by cardiology/structural cardiology team for his severe aortic stenosis, imaging was significant for questionable bladder mass, urology has been following as well.   Subjective:    Steve Richards today denies any dysuria, polyuria, no chest pain, fever or chills .   Assessment  & Plan :    Principal Problem:   New onset of congestive heart failure (HCC) Active Problems:   Hepatic cirrhosis (HCC)   Pleural effusion on right   Systolic ejection murmur   Cellulitis and abscess of left lower extremity   Hyperglycemia   Obesity, Class III, BMI 40-49.9 (morbid obesity) (HCC)   Acute systolic/diastolic CHF -Did not follow with his PCP for last 6 years, with  poor outpatient follow-up. -Evidence of volume overload on imaging, recurrent pleural effusion, elevated BNP and lower extremity edema. -Continue with IV diuresis, seems to be diuresing appropriately on current dose, and blood pressure has been tolerating it, so continue with 40 mg IV twice daily, still significantly volume overloaded and for the need of IV diuresis,  continue with daily labs close monitoring .  So far he is -20 L, will continue with current dose -2D echo significant for low EF 40 to 45%, with grade 2 diastolic CHF, cardiology has been consulted regarding further recommendations. -Blood pressure is persistently on the lower side, there is no room to add any beta-blockers or ACEi/ARB  Severe aortic stenosis -Management per cardiology . -likely contributing to multiple comorbidities, possibly causing cardiac cirrhosis, his lower extremity edema contributing to venous stasis and cellulitis. -Structural heart team input greatly appreciated, management per structural cardiology team, plan for cardiac cath today..   Addendum:, His cardiac cath looks clean, but it does appear his aortic stenosis is critical, so cardiothoracic surgery will consult, to see if he is more a candidate for surgical aortic valve replacement versus TAVR, and there is evidence of high intracardiac pressure, so recommendation is to continue with diuresis as tolerated as well .  COVID-19 infection -Patient denies any fever, cough currently, no evidence of pneumonia on imaging, he has no hypoxia, so for now no indication for steroids and remdesivir, but will continue to monitor closely especially with significantly elevated inflammatory markers. -D-dimers elevated at 6 on admission, it is contrary it is currently trending down, tinea with current DVT prophylaxis dose, his venous Doppler negative for DVT .  COVID-19 Labs  Recent Labs    07/26/19 0350  DDIMER 3.03*  CRP 3.0*    Lab Results  Component Value Date    SARSCOV2NAA POSITIVE (A) 07/21/2019   [place] NEGATIVE 06/23/2019   Liver cirrhosis -Patient denies any history of alcohol abuse in the past, this is most likely related to cardiac cirrhosis versus NASH. -We will DC Aldactone given soft blood pressure -Negative hepatitis panel  Left lower extremity cellulitis -He appears to be having left lower extremity cellulitis, which did not respond to Keflex, as well there is evidence of venous stasis disease contributing to his infection, as well significant lower extremity edema as well. -Initially on IV vancomycin, and IV cefazolin, then treated with doxycycline, finished his antibiotic course.  Questionable findings of bladder mass -Urology consult greatly appreciated, plan for bedside cystoscopy tomorrow -We will check PSA per urology recommendation  Hyperglycemia -A1c is 6.1  Morbid obesity  Hypothyroidism -TSH elevated at 14, with low normal free T4 at 0.65, started on low-dose Synthroid   Code Status : Full  Disposition Plan  :  Status is: Inpatient  Remains inpatient appropriate because:IV treatments appropriate due to intensity of illness or inability to take PO   Dispo: The patient is from: Home              Anticipated d/c is to: Home              Anticipated d/c date is: 3 days              Patient currently is not medically stable to d/c.  Patient still with significant volume overload, requiring further IV diuresis.  Plan for cardiac cath today, and bedside cystoscopy tomorrow.       Consults  :  Cardiology,Urology  Procedures  : None  DVT Prophylaxis  :  [place] lovenox  Lab Results  Component Value Date   PLT 204 07/28/2019    Antibiotics  :   Anti-infectives (From admission, onward)   Start     Dose/Rate Route Frequency Ordered Stop   07/25/19 1530  doxycycline (VIBRA-TABS) tablet 100 mg  Status:  Discontinued     100 mg  Oral Every 12 hours 07/25/19 1521 07/27/19 0809   07/22/19 1400  ceFAZolin  (ANCEF) IVPB 2g/100 mL premix  Status:  Discontinued     2 g 200 mL/hr over 30 Minutes Intravenous Every 8 hours 07/22/19 1223 07/25/19 1521   07/22/19 0000  vancomycin (VANCOREADY) IVPB 1250 mg/250 mL  Status:  Discontinued     1,250 mg 166.7 mL/hr over 90 Minutes Intravenous Every 12 hours 07/21/19 1141 07/22/19 1223   07/21/19 1145  vancomycin (VANCOREADY) IVPB 2000 mg/400 mL     2,000 mg 200 mL/hr over 120 Minutes Intravenous  Once 07/21/19 1141 07/21/19 1509        Objective:   Vitals:   07/28/19 1250 07/28/19 1255 07/28/19 1300 07/28/19 1305  BP: 113/85 115/83 121/89 123/90  Pulse: 81 84 82 86  Resp: (!) 6 20 (!) 24 16  Temp:      TempSrc:      SpO2: 99% 96% 93% (!) 0%  Weight:      Height:        Wt Readings from Last 3 Encounters:  07/28/19 123.7 kg  09/24/11 125.2 kg     Intake/Output Summary (Last 24 hours) at 07/28/2019 1329 Last data filed at 07/28/2019 0501 Gross per 24 hour  Intake 10 ml  Output 1200 ml  Net -1190 ml     Physical Exam  Awake Alert, Oriented X 3, No new F.N deficits, Normal affect Symmetrical Chest wall movement, Good air movement bilaterally, CTAB RRR,No Gallops,Rubs ,Systolic   Murmurs present , No Parasternal Heave +ve B.Sounds, Abd Soft, No tenderness, No rebound - guarding or rigidity. No Cyanosis, Clubbing or significant edema to above knees bilaterally, but this has been improving,    Data Review:    CBC Recent Labs  Lab 07/24/19 1237 07/25/19 0858 07/26/19 0350 07/27/19 0537 07/28/19 0431  WBC 7.9 6.9 7.9 8.2 8.3  HGB 14.6 13.7 13.6 13.8 13.5  HCT 45.8 42.9 43.0 43.5 42.7  PLT 215 193 197 195 204  MCV 92.9 93.9 93.5 93.5 93.6  MCH 29.6 30.0 29.6 29.7 29.6  MCHC 31.9 31.9 31.6 31.7 31.6  RDW 16.5* 16.5* 16.8* 16.8* 16.4*  LYMPHSABS 0.9 0.9 1.0 1.0 1.1  MONOABS 0.5 0.4 0.5 0.5 0.6  EOSABS 0.1 0.1 0.1 0.1 0.1  BASOSABS 0.0 0.0 0.0 0.0 0.1    Chemistries  Recent Labs  Lab 07/24/19 1237 07/25/19 0858  07/26/19 0350 07/27/19 0537 07/28/19 0431  NA 139 138 136 139 136  K 4.1 4.2 3.6 4.1 4.2  CL 93* 93* 94* 95* 94*  CO2 34* 34* 35* 33* 32  GLUCOSE 116* 150* 119* 127* 119*  BUN 18 16 16 17 19   CREATININE 0.95 1.00 0.92 0.93 0.96  CALCIUM 9.1 8.9 8.7* 9.1 8.9  MG 2.0 2.0 2.0 2.0 1.9  AST 36 35 36 35 35  ALT 22 19 18 19 20   ALKPHOS 284* 268* 253* 287* 273*  BILITOT 2.8* 2.6* 2.3* 2.9* 2.8*   ------------------------------------------------------------------------------------------------------------------ No results for input(s): CHOL, HDL, LDLCALC, TRIG, CHOLHDL, LDLDIRECT in the last 72 hours.  Lab Results  Component Value Date   HGBA1C 6.1 (H) 07/21/2019   ------------------------------------------------------------------------------------------------------------------ No results for input(s): TSH, T4TOTAL, T3FREE, THYROIDAB in the last 72 hours.  Invalid input(s): FREET3 ------------------------------------------------------------------------------------------------------------------ No results for input(s): VITAMINB12, FOLATE, FERRITIN, TIBC, IRON, RETICCTPCT in the last 72 hours.  Coagulation profile No results for input(s): INR, PROTIME in the last 168 hours.  Recent Labs    07/26/19  0350  DDIMER 3.03*    Cardiac Enzymes No results for input(s): CKMB, TROPONINI, MYOGLOBIN in the last 168 hours.  Invalid input(s): CK ------------------------------------------------------------------------------------------------------------------    Component Value Date/Time   BNP 1,142.6 (H) 07/21/2019 1316    Inpatient Medications  Scheduled Meds: . [MAR Hold] aspirin EC  81 mg Oral Daily  . [MAR Hold] docusate sodium  100 mg Oral BID  . [MAR Hold] enoxaparin (LOVENOX) injection  65 mg Subcutaneous Q24H  . [MAR Hold] feeding supplement (ENSURE ENLIVE)  237 mL Oral Q1500  . [MAR Hold] feeding supplement (PRO-STAT SUGAR FREE 64)  30 mL Oral Daily  . [MAR Hold] furosemide   40 mg Intravenous Q12H  . [MAR Hold] levothyroxine  25 mcg Oral Q0600  . [MAR Hold] multivitamin with minerals  1 tablet Oral Daily  . [MAR Hold] saccharomyces boulardii  250 mg Oral BID  . [MAR Hold] sodium chloride flush  10-40 mL Intracatheter Q12H  . [MAR Hold] sodium chloride flush  3 mL Intravenous Q12H   Continuous Infusions: . [MAR Hold] sodium chloride    . sodium chloride    . sodium chloride 10 mL/hr (07/28/19 1226)   PRN Meds:.[MAR Hold] sodium chloride, sodium chloride, [MAR Hold] acetaminophen **OR** [MAR Hold] acetaminophen, [MAR Hold] bisacodyl, fentaNYL, Heparin (Porcine) in NaCl, heparin sodium (porcine), [MAR Hold] hydrALAZINE, [MAR Hold] HYDROcodone-acetaminophen, iohexol, lidocaine (PF), midazolam, [MAR Hold] ondansetron **OR** [MAR Hold] ondansetron (ZOFRAN) IV, [MAR Hold] polyethylene glycol, Radial Cocktail/Verapamil only, [MAR Hold] sodium chloride flush, [MAR Hold] sodium chloride flush  Micro Results Recent Results (from the past 240 hour(s))  Culture, blood (routine x 2)     Status: None   Collection Time: 07/21/19 12:37 PM   Specimen: BLOOD RIGHT HAND  Result Value Ref Range Status   Specimen Description BLOOD RIGHT HAND  Final   Special Requests   Final    BOTTLES DRAWN AEROBIC ONLY Blood Culture results may not be optimal due to an inadequate volume of blood received in culture bottles   Culture   Final    NO GROWTH 5 DAYS Performed at [facility], [place] [address] [place], [place] [address]    Report Status 07/26/2019 FINAL  Final  SARS Coronavirus 2 by RT PCR (hospital order, performed in [facility] hospital lab) Nasopharyngeal Nasopharyngeal Swab     Status: Abnormal   Collection Time: 07/21/19 12:37 PM   Specimen: Nasopharyngeal Swab  Result Value Ref Range Status   SARS Coronavirus 2 POSITIVE (A) NEGATIVE Final    Comment: RESULT CALLED TO, READ BACK BY AND VERIFIED WITH: Angelina Ok RN 16:45 07/21/19 ([email or web address]) (NOTE) SARS-CoV-2  target nucleic acids are DETECTED SARS-CoV-2 RNA is generally detectable in upper respiratory specimens  during the acute phase of infection.  Positive results are indicative  of the presence of the identified virus, but do not rule out bacterial infection or co-infection with other pathogens not detected by the test.  Clinical correlation with patient history and  other diagnostic information is necessary to determine patient infection status.  The expected result is negative. Fact Sheet for Patients:   StrictlyIdeas.no  Fact Sheet for Healthcare Providers:   BankingDealers.co.za   This test is not yet approved or cleared by the [place] FDA and  has been authorized for detection and/or diagnosis of SARS-CoV-2 by FDA under an Emergency Use Authorization (EUA).  This EUA will remain in effect (meaning this  test can be used) for the duration of  the  COVID-19 declaration under Section 564(b)(1) of the Act, 21 U.S.C. section 360-bbb-3(b)(1), unless the authorization is terminated or revoked sooner. Performed at [facility], [place] [address] [place], [place] [address]   Culture, blood (routine x 2)     Status: None   Collection Time: 07/21/19 12:38 PM   Specimen: BLOOD  Result Value Ref Range Status   Specimen Description BLOOD RIGHT ANTECUBITAL  Final   Special Requests   Final    BOTTLES DRAWN AEROBIC AND ANAEROBIC Blood Culture results may not be optimal due to an inadequate volume of blood received in culture bottles   Culture   Final    NO GROWTH 5 DAYS Performed at [facility], [place] [address] [place], [place] [address]    Report Status 07/26/2019 FINAL  Final    Radiology Reports DG Chest 2 View  Result Date: 07/21/2019 CLINICAL DATA:  Chest pain and shortness of breath EXAM: CHEST - 2 VIEW COMPARISON:  06/25/2018 FINDINGS: Congested appearance of vessels with small pleural effusions. Cardiomegaly that is  chronic. Negative aortic contours. IMPRESSION: Cardiomegaly with vascular congestion and small pleural effusions. Electronically Signed   By: Monte Fantasia M.D.   On: 07/21/2019 08:42   CT CORONARY MORPH W/CTA COR W/SCORE W/CA W/CM &/OR WO/CM  Addendum Date: 07/27/2019   ADDENDUM REPORT: 07/27/2019 12:22 ADDENDUM: Extracardiac findings are described separately under dictation for contemporaneously obtained CTA chest, abdomen and pelvis performed on 07/25/2019. Please see dictation for that examination for description of relevant extracardiac findings. Electronically Signed   By: Vinnie Langton M.D.   On: 07/27/2019 12:22   Result Date: 07/27/2019 CLINICAL DATA:  54 year old male with severe aortic stenosis being evaluated for a TAVR procedure. EXAM: Cardiac TAVR CT TECHNIQUE: The patient was scanned on a [employer]. A 120 kV retrospective scan was triggered in the descending thoracic aorta at 111 HU's. Gantry rotation speed was 250 msecs and collimation was .6 mm. No beta blockade or nitro were given. The 3D data set was reconstructed in 5% intervals of the R-R cycle. Systolic and diastolic phases were analyzed on a dedicated work station using MPR, MIP and VRT modes. The patient received 80 cc of contrast. FINDINGS: Aortic Valve: Trileaflet aortic valve with severely calcified and thickened leaflets and only mild calcifications extending into the LVOT. Aorta: Normal size, minimal plaque, no calcifications, no dissection. Sinotubular Junction: 30 x 29 mm Ascending Thoracic Aorta: 35 x 34 mm Aortic Arch: Not visualized. Descending Thoracic Aorta: 22 x 22 mm Sinus of Valsalva Measurements: Non-coronary: 34 mm Right -coronary: 31 mm Left -coronary: 35 mm Coronary Artery Height above Annulus: Left Main: 13.1 mm Right Coronary: 18.2 mm Virtual Basal Annulus Measurements: Maximum/Minimum Diameter: 34.0 x 25.7 mm Mean Diameter: 28.2 mm Perimeter: 94.1 mm Area: 626 mm2 Coronary Arteries: Calcium score  is 0. Coronary Arteries:  Normal coronary origin.  Right dominance. RCA is a large dominant artery that gives rise to PDA and PLA. There is no plaque. Left main is a large artery that gives rise to LAD and LCX arteries. Left main has no plaque. LAD is a large vessel that has no plaque. LCX is a non-dominant artery that gives rise to one large OM1 branch. There is no plaque. Optimum Fluoroscopic Angle for Delivery: RAO 0 CRA 0 IMPRESSION: 1. Trileaflet aortic valve with severely calcified and thickened leaflets and only mild calcifications extending into the LVOT. Aortic valve calcium score 7968 consistent with severe aortic stenosis. Annular measurements  suitable for delivery of a 29 mm Edwards-SAPIEN 3 valve. 2. Sufficient coronary to annulus distance. 3. Optimum Fluoroscopic Angle for Delivery:  RAO 0 CRA 0. 4. No thrombus in the left atrial appendage. 5. Coronary Arteries: Normal origin. Right dominance. Calcium score is 0. There are only minimal irregularities. 6. Dilated pulmonary artery measuring 35 suggestive of pulmonary hypertension. Electronically Signed: By: Ena Dawley On: 07/26/2019 13:01   CT ANGIO CHEST AORTA W/CM & OR WO/CM  Result Date: 07/27/2019 CLINICAL DATA:  54 year old male with history of severe aortic stenosis. Preprocedural study prior to potential transcatheter aortic valve replacement (TAVR) procedure. EXAM: CT ANGIOGRAPHY CHEST, ABDOMEN AND PELVIS TECHNIQUE: Non-contrast CT of the chest was initially obtained. Multidetector CT imaging through the chest, abdomen and pelvis was performed using the standard protocol during bolus administration of intravenous contrast. Multiplanar reconstructed images and MIPs were obtained and reviewed to evaluate the vascular anatomy. CONTRAST:  153m OMNIPAQUE IOHEXOL 350 MG/ML SOLN COMPARISON:  No priors. FINDINGS: CTA CHEST FINDINGS Cardiovascular: Heart size is mildly enlarged. There is no significant pericardial fluid, thickening or  pericardial calcification. Aortic atherosclerosis. No definite coronary artery calcifications. Severe thickening calcification of the aortic valve. Dilatation of the pulmonic trunk (3.6 cm in diameter). Mediastinum/Lymph Nodes: No pathologically enlarged mediastinal or hilar lymph nodes. Esophagus is unremarkable in appearance. No axillary lymphadenopathy. Lungs/Pleura: Patchy but diffuse ground-glass attenuation and mild interlobular septal thickening noted throughout the lungs bilaterally, suggesting a background of mild interstitial pulmonary edema. Moderate bilateral pleural effusions lying dependently with some associated passive subsegmental atelectasis in the lower lobes of the lungs bilaterally. No definite suspicious appearing pulmonary nodules or masses. Musculoskeletal/Soft Tissues: Old healed fracture of the mid sternum with mild posttraumatic deformity. There are no aggressive appearing lytic or blastic lesions noted in the visualized portions of the skeleton. CTA ABDOMEN AND PELVIS FINDINGS Hepatobiliary: Liver has a slightly shrunken appearance and nodular contour, indicative of underlying cirrhosis. No discrete cystic or solid hepatic lesions. No intra or extrahepatic biliary ductal dilatation. Gallbladder is nearly completely decompressed, and otherwise unremarkable in appearance. Pancreas: No pancreatic mass. No pancreatic ductal dilatation. No pancreatic or peripancreatic fluid collections or inflammatory changes. Spleen: Unremarkable. Adrenals/Urinary Tract: Left kidney and bilateral adrenal glands are normal in appearance. In the lower pole of the right kidney there is an exophytic low-attenuation nonenhancing lesion measuring 8 cm in diameter, compatible with a simple cyst. No hydroureteronephrosis. Amorphous mass-like area of enhancement along the posterior wall of the urinary bladder measuring approximately 6.4 x 2.0 x 2.5 cm (axial image 214 of series 6 and sagittal image 111 of series 9).  Stomach/Bowel: Appearance of the stomach is normal. No pathologic dilatation of small bowel or colon. A few scattered colonic diverticulae are noted, without definite surrounding inflammatory changes to suggest an acute diverticulitis (study is limited by presence of small volume of ascites). Normal appendix. Vascular/Lymphatic: Aortic atherosclerosis with vascular findings and measurements pertinent to potential TAVR procedure, as detailed below. No aneurysm or dissection noted in the abdominal or pelvic vasculature. No lymphadenopathy noted in the abdomen or pelvis. Reproductive: Prostate gland and seminal vesicles are unremarkable in appearance. Other: Small volume of ascites.  No pneumoperitoneum. Musculoskeletal: There are no aggressive appearing lytic or blastic lesions noted in the visualized portions of the skeleton. Mild diffuse body wall edema. VASCULAR MEASUREMENTS PERTINENT TO TAVR: AORTA: Minimal Aortic Diameter-16 x 16 mm Severity of Aortic Calcification-mild RIGHT PELVIS: Right Common Iliac Artery - Minimal Diameter-9.0 x 11.1 mm Tortuosity - mild  Calcification-minimal Right External Iliac Artery - Minimal Diameter-9.5 x 9.1 mm Tortuosity - mild Calcification-none Right Common Femoral Artery - Minimal Diameter-6.4 x 8.6 mm Tortuosity - mild Calcification-none LEFT PELVIS: Left Common Iliac Artery - Minimal Diameter-11.6 x 9.9 mm Tortuosity - mild Calcification-minimal Left External Iliac Artery - Minimal Diameter-11.5 x 8.8 mm Tortuosity - mild Calcification-none Left Common Femoral Artery - Minimal Diameter-9.1 x 8.7 mm Tortuosity-mild Calcification-none Review of the MIP images confirms the above findings. IMPRESSION: 1. Vascular findings and measurements pertinent to potential TAVR procedure, as detailed above. 2. Severe thickening calcification of the aortic valve, compatible with reported clinical history of severe aortic stenosis. 3. Cardiomegaly with evidence of mild interstitial pulmonary  edema in the lungs and moderate bilateral pleural effusions; imaging findings concerning for congestive heart failure. 4. Amorphous mass-like area of enhancement along the posterior wall of the urinary bladder measuring approximately 6.4 x 2.0 x 2.5 cm. Nonemergent Urologic consultation is strongly recommended in the near future to better evaluate this finding, as the possibility of an infiltrative bladder neoplasm is not excluded. 5. There is also dilatation of the pulmonic trunk (3.6 cm in diameter), concerning for pulmonary arterial hypertension. 6. Morphologic changes in the liver suggesting early cirrhosis. 7. Small volume of ascites. 8. Mild diffuse body wall edema. 9. Additional incidental findings, as above. These results will be called to the ordering clinician or representative by the Radiologist Assistant, and communication documented in the PACS or [employer]. Electronically Signed   By: Vinnie Langton M.D.   On: 07/27/2019 12:47   ECHOCARDIOGRAM COMPLETE  Result Date: 07/21/2019    ECHOCARDIOGRAM REPORT   Patient Name:   Steve Richards Date of Exam: 07/21/2019 Medical Rec #:  [id number]   Height:       72.0 in Accession #:    [id number]  Weight:       310.0 lb Date of Birth:  01-22-1966   BSA:          2.567 m Patient Age:    35 years    BP:           102/73 mmHg Patient Gender: M           HR:           73 bpm. Exam Location:  Inpatient Procedure: 2D Echo Indications:    CHF-Acute Systolic 622.63 / F35.45  History:        Patient has no prior history of Echocardiogram examinations.                 Risk Factors:Obesity. Pleural effusion on right.  Sonographer:    Vikki Ports Turrentine Referring Phys: [place]  1. Left ventricular ejection fraction, by estimation, is 40 to 45%. The left ventricle has mildly decreased function. The left ventricle demonstrates global hypokinesis. There is moderate left ventricular hypertrophy. Left ventricular diastolic parameters are consistent  with Grade II diastolic dysfunction (pseudonormalization).  2. Right ventricular systolic function is mildly reduced. The right ventricular size is normal. There is moderately elevated pulmonary artery systolic pressure.  3. Left atrial size was moderately dilated.  4. The mitral valve is abnormal. Trivial mitral valve regurgitation.  5. Cannot exclude a bicuspid valve. Aortic valve regurgitation is mild. Critical aortic valve stenosis. Aortic valve area, by VTI measures 0.73 cm. Aortic valve mean gradient measures 96.2 mmHg. Peak gradient of 137 mmHg. Aortic valve Vmax measures 5.86 m/s.  6. The inferior vena cava is dilated in size with <50% respiratory  variability, suggesting right atrial pressure of 15 mmHg.  7. Moderate pleural effusion in the right lateral region. FINDINGS  Left Ventricle: Left ventricular ejection fraction, by estimation, is 40 to 45%. The left ventricle has mildly decreased function. The left ventricle demonstrates global hypokinesis. The left ventricular internal cavity size was normal in size. There is  moderate left ventricular hypertrophy. Left ventricular diastolic parameters are consistent with Grade II diastolic dysfunction (pseudonormalization). Indeterminate filling pressures. Right Ventricle: The right ventricular size is normal. No increase in right ventricular wall thickness. Right ventricular systolic function is mildly reduced. There is moderately elevated pulmonary artery systolic pressure. The tricuspid regurgitant velocity is 3.35 m/s, and with an assumed right atrial pressure of 15 mmHg, the estimated right ventricular systolic pressure is 16.1 mmHg. Left Atrium: Left atrial size was moderately dilated. Right Atrium: Right atrial size was normal in size. Pericardium: There is no evidence of pericardial effusion. Mitral Valve: The mitral valve is abnormal. Mild to moderate mitral annular calcification. Trivial mitral valve regurgitation. Tricuspid Valve: The tricuspid valve  is grossly normal. Tricuspid valve regurgitation is mild. Aortic Valve: The aortic valve is tricuspid. . There is moderate thickening and severe calcifcation of the aortic valve. Aortic valve regurgitation is mild. Aortic regurgitation PHT measures 374 msec. Severe aortic stenosis is present. There is moderate thickening of the aortic valve. There is severe calcifcation of the aortic valve. Aortic valve mean gradient measures 96.2 mmHg. Aortic valve peak gradient measures 137.3 mmHg. Aortic valve area, by VTI measures 0.73 cm. Pulmonic Valve: The pulmonic valve was normal in structure. Pulmonic valve regurgitation is not visualized. Aorta: The aortic root and ascending aorta are structurally normal, with no evidence of dilitation. Venous: The inferior vena cava is dilated in size with less than 50% respiratory variability, suggesting right atrial pressure of 15 mmHg. IAS/Shunts: No atrial level shunt detected by color flow Doppler. Additional Comments: There is a moderate pleural effusion in the right lateral region.  LEFT VENTRICLE PLAX 2D LVIDd:         5.50 cm  Diastology LVIDs:         4.40 cm  LV e' lateral:   9.23 cm/s LV PW:         1.40 cm  LV E/e' lateral: 10.2 LV IVS:        1.40 cm  LV e' medial:    7.05 cm/s LVOT diam:     2.50 cm  LV E/e' medial:  13.4 LV SV:         108 LV SV Index:   42 LVOT Area:     4.91 cm  RIGHT VENTRICLE RV S prime:     7.20 cm/s TAPSE (M-mode): 1.2 cm LEFT ATRIUM              Index       RIGHT ATRIUM           Index LA diam:        5.90 cm  2.30 cm/m  RA Area:     22.60 cm LA Vol (A2C):   121.0 ml 47.14 ml/m RA Volume:   68.80 ml  26.80 ml/m LA Vol (A4C):   95.5 ml  37.21 ml/m LA Biplane Vol: 110.0 ml 42.86 ml/m  AORTIC VALVE AV Area (Vmax):    0.82 cm AV Area (Vmean):   0.72 cm AV Area (VTI):     0.73 cm AV Vmax:           585.80 cm/s  AV Vmean:          474.000 cm/s AV VTI:            1.480 m AV Peak Grad:      137.3 mmHg AV Mean Grad:      96.2 mmHg LVOT Vmax:          97.30 cm/s LVOT Vmean:        69.800 cm/s LVOT VTI:          0.220 m LVOT/AV VTI ratio: 0.15 AI PHT:            374 msec  AORTA Ao Root diam: 3.00 cm MITRAL VALVE               TRICUSPID VALVE MV Area (PHT): 5.54 cm    TR Peak grad:   44.9 mmHg MV Decel Time: 137 msec    TR Vmax:        335.00 cm/s MV E velocity: 94.30 cm/s MV A velocity: 46.30 cm/s  SHUNTS MV E/A ratio:  2.04        Systemic VTI:  0.22 m                            Systemic Diam: 2.50 cm Lyman Bishop MD Electronically signed by Lyman Bishop MD Signature Date/Time: 07/21/2019/3:42:23 PM    Final    VAS US CAROTID  Result Date: 07/27/2019 Carotid Arterial Duplex Study Indications:       Covid-19. Other Factors:     Critical Aortic Stenosis, CHF, Morbid Obesity. Limitations        Today's exam was limited due to the body habitus of the                    patient. Comparison Study:  No prior study on file for comparison Performing Technologist: Sharion Dove RVS  Examination Guidelines: A complete evaluation includes B-mode imaging, spectral Doppler, color Doppler, and power Doppler as needed of all accessible portions of each vessel. Bilateral testing is considered an integral part of a complete examination. Limited examinations for reoccurring indications may be performed as noted.  Right Carotid Findings: +----------+--------+--------+--------+------------------+------------------+           PSV cm/sEDV cm/sStenosisPlaque DescriptionComments           +----------+--------+--------+--------+------------------+------------------+ CCA Prox  41      7                                 intimal thickening +----------+--------+--------+--------+------------------+------------------+ CCA Distal51      12                                intimal thickening +----------+--------+--------+--------+------------------+------------------+ ICA Prox  42      12              heterogenous                          +----------+--------+--------+--------+------------------+------------------+ ICA Distal48      15                                                   +----------+--------+--------+--------+------------------+------------------+  ECA       62      10                                                   +----------+--------+--------+--------+------------------+------------------+ +----------+--------+-------+--------+-------------------+           PSV cm/sEDV cmsDescribeArm Pressure (mmHG) +----------+--------+-------+--------+-------------------+ [id number]                                         +----------+--------+-------+--------+-------------------+ +---------+--------+--+--------+-+ VertebralPSV cm/s28EDV cm/s9 +---------+--------+--+--------+-+  Left Carotid Findings: +----------+--------+--------+--------+------------------+------------------+           PSV cm/sEDV cm/sStenosisPlaque DescriptionComments           +----------+--------+--------+--------+------------------+------------------+ CCA Prox  73      12                                intimal thickening +----------+--------+--------+--------+------------------+------------------+ CCA Distal43      9                                 intimal thickening +----------+--------+--------+--------+------------------+------------------+ ICA Prox  54      11                                                   +----------+--------+--------+--------+------------------+------------------+ ICA Distal67      19                                                   +----------+--------+--------+--------+------------------+------------------+ ECA       53      5                                                    +----------+--------+--------+--------+------------------+------------------+ +----------+--------+--------+--------+-------------------+           PSV cm/sEDV cm/sDescribeArm Pressure (mmHG)  +----------+--------+--------+--------+-------------------+ Subclavian113                                         +----------+--------+--------+--------+-------------------+ +---------+--------+--+--------+--+ VertebralPSV cm/s75EDV cm/s20 +---------+--------+--+--------+--+   Summary: Right Carotid: The extracranial vessels were near-normal with only minimal wall                thickening or plaque. Left Carotid: The extracranial vessels were near-normal with only minimal wall               thickening or plaque. Vertebrals:  Bilateral vertebral arteries demonstrate antegrade flow. Subclavians: Normal flow hemodynamics were seen in bilateral subclavian              arteries. *See table(s) above for measurements and observations.  Electronically signed by Erlene Quan  Donzetta Matters MD on 07/27/2019 at 12:53:06 AM.    Final    VAS [place] LOWER EXTREMITY VENOUS (DVT)  Result Date: 07/23/2019  Lower Venous DVTStudy Indications: Edema, and elevated ddimer.  Limitations: Body habitus and poor ultrasound/tissue interface. Comparison Study: no prior Performing Technologist: Abram Sander RVS  Examination Guidelines: A complete evaluation includes B-mode imaging, spectral Doppler, color Doppler, and power Doppler as needed of all accessible portions of each vessel. Bilateral testing is considered an integral part of a complete examination. Limited examinations for reoccurring indications may be performed as noted. The reflux portion of the exam is performed with the patient in reverse Trendelenburg.  +---------+---------------+---------+-----------+----------+--------------+ RIGHT    CompressibilityPhasicitySpontaneityPropertiesThrombus Aging +---------+---------------+---------+-----------+----------+--------------+ CFV      Full           Yes      Yes                                 +---------+---------------+---------+-----------+----------+--------------+ SFJ      Full                                                         +---------+---------------+---------+-----------+----------+--------------+ FV Prox  Full                                                        +---------+---------------+---------+-----------+----------+--------------+ FV Mid                  Yes      Yes                                 +---------+---------------+---------+-----------+----------+--------------+ FV Distal               Yes      Yes                                 +---------+---------------+---------+-----------+----------+--------------+ PFV      Full                                                        +---------+---------------+---------+-----------+----------+--------------+ POP      Full           Yes      Yes                                 +---------+---------------+---------+-----------+----------+--------------+ PTV      Full                                                        +---------+---------------+---------+-----------+----------+--------------+ PERO  Full                                                        +---------+---------------+---------+-----------+----------+--------------+   +---------+---------------+---------+-----------+----------+--------------+ LEFT     CompressibilityPhasicitySpontaneityPropertiesThrombus Aging +---------+---------------+---------+-----------+----------+--------------+ CFV      Full           Yes      Yes                                 +---------+---------------+---------+-----------+----------+--------------+ SFJ      Full                                                        +---------+---------------+---------+-----------+----------+--------------+ FV Prox  Full                                                        +---------+---------------+---------+-----------+----------+--------------+ FV Mid   Full                                                         +---------+---------------+---------+-----------+----------+--------------+ FV Distal               Yes      Yes                                 +---------+---------------+---------+-----------+----------+--------------+ PFV      Full                                                        +---------+---------------+---------+-----------+----------+--------------+ POP      Full           Yes      Yes                                 +---------+---------------+---------+-----------+----------+--------------+ PTV      Full                                                        +---------+---------------+---------+-----------+----------+--------------+ PERO                                                  Not  visualized +---------+---------------+---------+-----------+----------+--------------+     Summary: BILATERAL: - No evidence of deep vein thrombosis seen in the lower extremities, bilaterally. -   *See table(s) above for measurements and observations. Electronically signed by Monica Martinez MD on 07/23/2019 at 5:31:24 PM.    Final    CT Angio Abd/Pel w/ and/or w/o  Result Date: 07/27/2019 CLINICAL DATA:  54 year old male with history of severe aortic stenosis. Preprocedural study prior to potential transcatheter aortic valve replacement (TAVR) procedure. EXAM: CT ANGIOGRAPHY CHEST, ABDOMEN AND PELVIS TECHNIQUE: Non-contrast CT of the chest was initially obtained. Multidetector CT imaging through the chest, abdomen and pelvis was performed using the standard protocol during bolus administration of intravenous contrast. Multiplanar reconstructed images and MIPs were obtained and reviewed to evaluate the vascular anatomy. CONTRAST:  131m OMNIPAQUE IOHEXOL 350 MG/ML SOLN COMPARISON:  No priors. FINDINGS: CTA CHEST FINDINGS Cardiovascular: Heart size is mildly enlarged. There is no significant pericardial fluid, thickening or pericardial calcification. Aortic atherosclerosis. No  definite coronary artery calcifications. Severe thickening calcification of the aortic valve. Dilatation of the pulmonic trunk (3.6 cm in diameter). Mediastinum/Lymph Nodes: No pathologically enlarged mediastinal or hilar lymph nodes. Esophagus is unremarkable in appearance. No axillary lymphadenopathy. Lungs/Pleura: Patchy but diffuse ground-glass attenuation and mild interlobular septal thickening noted throughout the lungs bilaterally, suggesting a background of mild interstitial pulmonary edema. Moderate bilateral pleural effusions lying dependently with some associated passive subsegmental atelectasis in the lower lobes of the lungs bilaterally. No definite suspicious appearing pulmonary nodules or masses. Musculoskeletal/Soft Tissues: Old healed fracture of the mid sternum with mild posttraumatic deformity. There are no aggressive appearing lytic or blastic lesions noted in the visualized portions of the skeleton. CTA ABDOMEN AND PELVIS FINDINGS Hepatobiliary: Liver has a slightly shrunken appearance and nodular contour, indicative of underlying cirrhosis. No discrete cystic or solid hepatic lesions. No intra or extrahepatic biliary ductal dilatation. Gallbladder is nearly completely decompressed, and otherwise unremarkable in appearance. Pancreas: No pancreatic mass. No pancreatic ductal dilatation. No pancreatic or peripancreatic fluid collections or inflammatory changes. Spleen: Unremarkable. Adrenals/Urinary Tract: Left kidney and bilateral adrenal glands are normal in appearance. In the lower pole of the right kidney there is an exophytic low-attenuation nonenhancing lesion measuring 8 cm in diameter, compatible with a simple cyst. No hydroureteronephrosis. Amorphous mass-like area of enhancement along the posterior wall of the urinary bladder measuring approximately 6.4 x 2.0 x 2.5 cm (axial image 214 of series 6 and sagittal image 111 of series 9). Stomach/Bowel: Appearance of the stomach is normal. No  pathologic dilatation of small bowel or colon. A few scattered colonic diverticulae are noted, without definite surrounding inflammatory changes to suggest an acute diverticulitis (study is limited by presence of small volume of ascites). Normal appendix. Vascular/Lymphatic: Aortic atherosclerosis with vascular findings and measurements pertinent to potential TAVR procedure, as detailed below. No aneurysm or dissection noted in the abdominal or pelvic vasculature. No lymphadenopathy noted in the abdomen or pelvis. Reproductive: Prostate gland and seminal vesicles are unremarkable in appearance. Other: Small volume of ascites.  No pneumoperitoneum. Musculoskeletal: There are no aggressive appearing lytic or blastic lesions noted in the visualized portions of the skeleton. Mild diffuse body wall edema. VASCULAR MEASUREMENTS PERTINENT TO TAVR: AORTA: Minimal Aortic Diameter-16 x 16 mm Severity of Aortic Calcification-mild RIGHT PELVIS: Right Common Iliac Artery - Minimal Diameter-9.0 x 11.1 mm Tortuosity - mild Calcification-minimal Right External Iliac Artery - Minimal Diameter-9.5 x 9.1 mm Tortuosity - mild Calcification-none Right Common Femoral Artery - Minimal Diameter-6.4 x 8.6 mm  Tortuosity - mild Calcification-none LEFT PELVIS: Left Common Iliac Artery - Minimal Diameter-11.6 x 9.9 mm Tortuosity - mild Calcification-minimal Left External Iliac Artery - Minimal Diameter-11.5 x 8.8 mm Tortuosity - mild Calcification-none Left Common Femoral Artery - Minimal Diameter-9.1 x 8.7 mm Tortuosity-mild Calcification-none Review of the MIP images confirms the above findings. IMPRESSION: 1. Vascular findings and measurements pertinent to potential TAVR procedure, as detailed above. 2. Severe thickening calcification of the aortic valve, compatible with reported clinical history of severe aortic stenosis. 3. Cardiomegaly with evidence of mild interstitial pulmonary edema in the lungs and moderate bilateral pleural  effusions; imaging findings concerning for congestive heart failure. 4. Amorphous mass-like area of enhancement along the posterior wall of the urinary bladder measuring approximately 6.4 x 2.0 x 2.5 cm. Nonemergent Urologic consultation is strongly recommended in the near future to better evaluate this finding, as the possibility of an infiltrative bladder neoplasm is not excluded. 5. There is also dilatation of the pulmonic trunk (3.6 cm in diameter), concerning for pulmonary arterial hypertension. 6. Morphologic changes in the liver suggesting early cirrhosis. 7. Small volume of ascites. 8. Mild diffuse body wall edema. 9. Additional incidental findings, as above. These results will be called to the ordering clinician or representative by the Radiologist Assistant, and communication documented in the PACS or [employer]. Electronically Signed   By: Vinnie Langton M.D.   On: 07/27/2019 12:47    Phillips Climes M.D on 07/28/2019 at 1:29 PM   [facility] -  Office  [phone]

## 2019-07-28 NOTE — Progress Notes (Signed)
Brief cardiology follow up: Planned for right/left heart cath today, see note from yesterday re: discussion.  Net negative 20 L, weight 123.7 kg today from 140.6 kg on admission. Labs reviewed, renal function and potassium stable.  Planned for cath this afternoon, which will further guide management.  Buford Dresser, MD, PhD [facility]  [address] [place], [place] [address] [phone]

## 2019-07-28 NOTE — Interval H&P Note (Signed)
History and Physical Interval Note:  07/28/2019 11:20 AM  Steve Richards  has presented today for surgery, with the diagnosis of aortic stenosis.  The various methods of treatment have been discussed with the patient and family. After consideration of risks, benefits and other options for treatment, the patient has consented to  Procedure(s): RIGHT/LEFT HEART CATH AND CORONARY ANGIOGRAPHY (N/A) as a surgical intervention.  The patient's history has been reviewed, patient examined, no change in status, stable for surgery.  I have reviewed the patient's chart and labs.  Questions were answered to the patient's satisfaction.     Sherren Mocha

## 2019-07-28 NOTE — Telephone Encounter (Signed)
Got it!     Thanks =).

## 2019-07-29 LAB — POCT I-STAT 7, (LYTES, BLD GAS, ICA,H+H)
Acid-Base Excess: 7 mmol/L — ABNORMAL HIGH (ref 0.0–2.0)
Bicarbonate: 34.2 mmol/L — ABNORMAL HIGH (ref 20.0–28.0)
Calcium, Ion: 1.2 mmol/L (ref 1.15–1.40)
HCT: 45 % (ref 39.0–52.0)
Hemoglobin: 15.3 g/dL (ref 13.0–17.0)
O2 Saturation: 93 %
Potassium: 4 mmol/L (ref 3.5–5.1)
Sodium: 137 mmol/L (ref 135–145)
TCO2: 36 mmol/L — ABNORMAL HIGH (ref 22–32)
pCO2 arterial: 55.1 mmHg — ABNORMAL HIGH (ref 32.0–48.0)
pH, Arterial: 7.4 (ref 7.350–7.450)
pO2, Arterial: 70 mmHg — ABNORMAL LOW (ref 83.0–108.0)

## 2019-07-29 LAB — POCT I-STAT EG7
Acid-Base Excess: 7 mmol/L — ABNORMAL HIGH (ref 0.0–2.0)
Bicarbonate: 35 mmol/L — ABNORMAL HIGH (ref 20.0–28.0)
Calcium, Ion: 1.15 mmol/L (ref 1.15–1.40)
HCT: 44 % (ref 39.0–52.0)
Hemoglobin: 15 g/dL (ref 13.0–17.0)
O2 Saturation: 62 %
Potassium: 4 mmol/L (ref 3.5–5.1)
Sodium: 140 mmol/L (ref 135–145)
TCO2: 37 mmol/L — ABNORMAL HIGH (ref 22–32)
pCO2, Ven: 60.9 mmHg — ABNORMAL HIGH (ref 44.0–60.0)
pH, Ven: 7.367 (ref 7.250–7.430)
pO2, Ven: 35 mmHg (ref 32.0–45.0)

## 2019-07-29 LAB — CBC
HCT: 41.7 % (ref 39.0–52.0)
Hemoglobin: 13.1 g/dL (ref 13.0–17.0)
MCH: 30 pg (ref 26.0–34.0)
MCHC: 31.4 g/dL (ref 30.0–36.0)
MCV: 95.4 fL (ref 80.0–100.0)
Platelets: 206 10*3/uL (ref 150–400)
RBC: 4.37 MIL/uL (ref 4.22–5.81)
RDW: 17 % — ABNORMAL HIGH (ref 11.5–15.5)
WBC: 7.4 10*3/uL (ref 4.0–10.5)
nRBC: 0 % (ref 0.0–0.2)

## 2019-07-29 LAB — BASIC METABOLIC PANEL
Anion gap: 8 (ref 5–15)
BUN: 16 mg/dL (ref 6–20)
CO2: 33 mmol/L — ABNORMAL HIGH (ref 22–32)
Calcium: 9.1 mg/dL (ref 8.9–10.3)
Chloride: 96 mmol/L — ABNORMAL LOW (ref 98–111)
Creatinine, Ser: 0.9 mg/dL (ref 0.61–1.24)
GFR calc Af Amer: >60 mL/min (ref >60)
GFR calc non Af Amer: >60 mL/min (ref >60)
Glucose, Bld: 119 mg/dL — ABNORMAL HIGH (ref 70–99)
Potassium: 4.1 mmol/L (ref 3.5–5.1)
Sodium: 137 mmol/L (ref 135–145)

## 2019-07-29 LAB — PSA: Prostatic Specific Antigen: 0.27 ng/mL (ref 0.00–4.00)

## 2019-07-29 LAB — C-REACTIVE PROTEIN: CRP: 1.8 mg/dL — ABNORMAL HIGH (ref <1.0)

## 2019-07-29 LAB — PROCALCITONIN: Procalcitonin: <0.1 ng/mL

## 2019-07-29 MED ORDER — HYDROCODONE-ACETAMINOPHEN 5-325 MG PO TABS: 1.0000 | ORAL_TABLET | Freq: Four times a day (QID) | ORAL | Status: DC | PRN

## 2019-07-29 MED ORDER — SPIRONOLACTONE 25 MG PO TABS
25.0000 mg | ORAL_TABLET | Freq: Every day | ORAL | Status: DC
  Administered 2019-07-29: 25 mg via ORAL
  Filled 2019-07-29 (×2): qty 1

## 2019-07-29 NOTE — Progress Notes (Signed)
Pre-procedure diagnosis: possible bladder lesion  Post-procedure diagnosis: prominent trigone/normal bladder mucsoa   Procedure: diagnostic cystoscopy with wire assistance  Surgeon: Jacalyn Lefevre, MD  Indication for procedure: 54 yo man currently admitted for severe aortic stenosis with acute CHF also with asymptomatic COVID infection found to have possible bladder lesion on imaging of abd/pelvis.  Procedure in detail:  After the risks and benefits of the procedure were discussed with the patient, patient agreed to proceed.  Attempts at placing 17Fr flexible cystoscope in meatus were unsuccessful due to penile foreskin edema and lack of visualization of urethral meatus.  A 0.38 sensor wire was then use to find the urethral meatus and it was advanced into the bladder without resistance.  The flexible cystoscoope was then advanced over the wire into the bladder.    Inspection of the bladder revealed normal bladder mucosa without masses, stones, or tumors.  See below findings.   The cystoscope was removed.  Patient tolerated the procedure well.  Findings: 1. Edematous foreskin and unable to visualize urethral meatus 2. Normal urethra 3. Mild lateral lobe prostatic enlargement, no intravesical median lobe 4. Bladder mucosa normal without masses/stones/tumors 5. Prominent trigone that is not uniform but no nodularity or suspicious findings  A/P: 54 yo man with aortic stenosis with plan for TAVR soon without concerning findings on imaging.  1. Bladder lesion has been ruled out by cystoscopy. Trigone is slightly asymmetric/promiment. -recommend voided urine cytology and then this will complete work up  2. Prostate cancer screening -patient with very mild prominent ridge R base, approx 40 g prostate by DRE -normal PSA at 0.27 -no further work up  3. Edematous penis/scrotum - hopefully this will improve with diuresis, he can follow up at [facility] as an outpatient to further  evaluate after TAVR

## 2019-07-29 NOTE — Progress Notes (Signed)
Brief cardiology note: Chart reviewed, diuresing well, net negative 25L this admission, weight now 121 kg. Cr remains stable. Cystoscopy today without evidence of concerning mass in bladder.  Agree with continued diuresis given numbers on cath. CT surgery to see as well to evaluate for SAVR vs TAVR.  Buford Dresser, MD, PhD [facility]  [address], [place], [place] [address] [phone]

## 2019-07-29 NOTE — Progress Notes (Signed)
PROGRESS NOTE                                                                                                                                                                                                             Patient Demographics:    Steve Richards, is a 54 y.o. male, DOB - 11-Aug-1965, SRP:[id number]  Admit date - 07/21/2019   Admitting Physician Karmen Bongo, MD  Outpatient Primary MD for the patient is System, Pcp Not In  LOS - 8   Chief Complaint  Patient presents with  . Shortness of Breath  . Leg Swelling       Brief Narrative     Steve Richards is a 54 y.o. male with medical history significant of R pleural effusion in April 2021 presenting with SOB and LE edema.    Patient did not follow with PCP for last 6 years, patient with recent work-up as an outpatient and negative for CHF, volume overload with thoracentesis status post drain, as well evidence of liver cirrhosis, and recent diagnosis of left lower extremity cellulitis where he was started on Keflex, ED secondary to dyspnea, evidence of volume overload, as well he tested Covid positive on admission .  His work-up was significant for volume overload, with severe aortic stenosis, acute systolic/diastolic CHF, he has been seen by cardiology/structural cardiology team for his severe aortic stenosis, imaging was significant for questionable bladder mass, urology has been following as well.   Subjective:   Patient in bed, appears comfortable, denies any headache, no fever, no chest pain or pressure, no shortness of breath , no abdominal pain. No focal weakness.   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 -Did not follow with his PCP for last 6 years, with poor outpatient follow-up. -Evidence of volume overload on imaging, recurrent pleural effusion, elevated BNP and lower extremity edema. -Continue with IV diuresis, seems  to be diuresing appropriately on current dose, and blood pressure has been tolerating it, so continue with 40 mg IV twice daily, still significantly volume overloaded and for the need of IV diuresis, continue with daily labs close monitoring .  So far he is -20 L, will continue with current dose -2D echo significant for low EF 40 to 45%, with grade 2  diastolic CHF, cardiology has been consulted regarding further recommendations. -Blood pressure is persistently on the lower side, there is no room to add any beta-blockers or ACEi/ARB  Severe aortic stenosis -Neurology following left heart cath on 07/28/2019 shows no significant CAD, cardiothoracic surgery will evaluate him for surgical correction of his severe aortic stenosis and likely to proceed with the procedure on 07/31/2019.  Also evaluated by TAVR team.    COVID-19 infection -Patient denies any fever, cough currently, no evidence of pneumonia on imaging, he has no hypoxia, so for now no indication for steroids and remdesivir, but will continue to monitor closely especially with significantly elevated inflammatory markers. -D-dimers elevated at 6 on admission, it is contrary it is currently trending down, tinea with current DVT prophylaxis dose, his venous Doppler negative for DVT .  COVID-19 Labs  No results for input(s): DDIMER, FERRITIN, LDH, CRP in the last 72 hours.  Lab Results  Component Value Date   [place] (A) 07/21/2019   [place] NEGATIVE 06/23/2019   Liver cirrhosis -Patient denies any history of alcohol abuse in the past, this is most likely related to cardiac cirrhosis versus NASH. -We will DC Aldactone given soft blood pressure -Negative hepatitis panel  Left lower extremity cellulitis - has been treated with antibiotics this admission and will nowl monitor clinically.  Some discoloration is due to massive edema.    Questionable findings of bladder mass -Urology consult done, bedside cystoscopy on  07/29/2019 unremarkable, outpatient follow-up with urology post discharge.  Hyperglycemia -A1c is 6.1, prediabetic PCP to monitor.  Morbid obesity  Hypothyroidism -TSH elevated at 14, with low normal free T4 at 0.65, started on low-dose Synthroid   Code Status : Full  Disposition Plan  :  Status is: Inpatient  Remains inpatient appropriate because:IV treatments appropriate due to intensity of illness or inability to take PO   Dispo: The patient is from: Home              Anticipated d/c is to: Home              Anticipated d/c date is: 3 days              Patient currently is not medically stable to d/c.  Patient still with significant volume overload, requiring further IV diuresis.  Plan for cardiac cath today, and bedside cystoscopy tomorrow.       Consults  :  Cardiology,Urology, cardiothoracic surgery  Procedures  :   Heart catheterization on 07/28/2019.     1.  Patent coronary arteries with no obstructive CAD 2.  Severe calcification and restriction of the aortic valve leaflets with hemodynamic evidence of critical aortic stenosis.  Mean aortic gradient 57 mmHg, peak gradient 108 mmHg, calculated aortic valve area 0.6 cm 3.  Elevated right and left heart pressures consistent with congestive heart failure   Bedside cystoscopy on 07/29/2019.  Unremarkable.  Bilateral leg ultrasound.  No DVT.    CTA -  1. Vascular findings and measurements pertinent to potential TAVR procedure, as detailed above. 2. Severe thickening calcification of the aortic valve, compatible with reported clinical history of severe aortic stenosis. 3. Cardiomegaly with evidence of mild interstitial pulmonary edema in the lungs and moderate bilateral pleural effusions; imaging findings concerning for congestive heart failure. 4. Amorphous mass-like area of enhancement along the posterior wall of the urinary bladder measuring approximately 6.4 x 2.0 x 2.5 cm. Nonemergent Urologic consultation is strongly  recommended in the near future to better evaluate  this finding, as the possibility of an infiltrative bladder neoplasm is not excluded. 5. There is also dilatation of the pulmonic trunk (3.6 cm in diameter), concerning for pulmonary arterial hypertension. 6. Morphologic changes in the liver suggesting early cirrhosis. 7. Small volume of ascites. 8. Mild diffuse body wall edema.  TTE   1. Left ventricular ejection fraction, by estimation, is 40 to 45%. The left ventricle has mildly decreased function. The left ventricle demonstrates global hypokinesis. There is moderate left ventricular hypertrophy. Left ventricular diastolic parameters are consistent with Grade II diastolic dysfunction  (pseudonormalization).  2. Right ventricular systolic function is mildly reduced. The right ventricular size is normal. There is moderately elevated pulmonary artery  systolic pressure.  3. Left atrial size was moderately dilated.  4. The mitral valve is abnormal. Trivial mitral valve regurgitation.  5. Cannot exclude a bicuspid valve. Aortic valve regurgitation is mild. Critical aortic valve stenosis. Aortic valve area, by VTI measures 0.73  cm. Aortic valve mean gradient measures 96.2 mmHg. Peak gradient of 137 mmHg. Aortic valve Vmax measures 5.86 m/s.  6. The inferior vena cava is dilated in size with <50% respiratory variability, suggesting right atrial pressure of 15 mmHg.  7. Moderate pleural effusion in the right lateral region.     DVT Prophylaxis  :  [place] lovenox  Lab Results  Component Value Date   PLT 206 07/29/2019    Antibiotics  :   Anti-infectives (From admission, onward)   Start     Dose/Rate Route Frequency Ordered Stop   07/25/19 1530  doxycycline (VIBRA-TABS) tablet 100 mg  Status:  Discontinued     100 mg Oral Every 12 hours 07/25/19 1521 07/27/19 0809   07/22/19 1400  ceFAZolin (ANCEF) IVPB 2g/100 mL premix  Status:  Discontinued     2 g 200 mL/hr over 30 Minutes  Intravenous Every 8 hours 07/22/19 1223 07/25/19 1521   07/22/19 0000  vancomycin (VANCOREADY) IVPB 1250 mg/250 mL  Status:  Discontinued     1,250 mg 166.7 mL/hr over 90 Minutes Intravenous Every 12 hours 07/21/19 1141 07/22/19 1223   07/21/19 1145  vancomycin (VANCOREADY) IVPB 2000 mg/400 mL     2,000 mg 200 mL/hr over 120 Minutes Intravenous  Once 07/21/19 1141 07/21/19 1509        Objective:   Vitals:   07/28/19 1600 07/28/19 2133 07/29/19 0638 07/29/19 0730  BP: 107/75 (!) 114/94 95/68   Pulse: 83 94 72   Resp: 20 18    Temp:  98.7 F (37.1 C) 97.7 F (36.5 C)   TempSrc:  Oral    SpO2: 92% 93% 92% 92%  Weight:   121 kg   Height:        Wt Readings from Last 3 Encounters:  07/29/19 121 kg  09/24/11 125.2 kg     Intake/Output Summary (Last 24 hours) at 07/29/2019 1325 Last data filed at 07/29/2019 1200 Gross per 24 hour  Intake 240 ml  Output 5250 ml  Net -5010 ml     Physical Exam  Awake Alert, No new F.N deficits, Normal affect [place].AT,PERRAL Supple Neck,No JVD, No cervical lymphadenopathy appriciated.  Symmetrical Chest wall movement, Good air movement bilaterally, CTAB RRR,No Gallops, Rubs, positive aortic systolic murmur, no Parasternal Heave +ve B.Sounds, Abd Soft, No tenderness, No organomegaly appriciated, No rebound - guarding or rigidity. No Cyanosis, 2+ leg edema L>R,     Data Review:    CBC Recent Labs  Lab 07/24/19 1237 07/24/19 1237 07/25/19 6789  07/26/19 0350 07/27/19 0537 07/28/19 0431 07/29/19 0434  WBC 7.9   < > 6.9 7.9 8.2 8.3 7.4  HGB 14.6   < > 13.7 13.6 13.8 13.5 13.1  HCT 45.8   < > 42.9 43.0 43.5 42.7 41.7  PLT 215   < > 193 197 195 204 206  MCV 92.9   < > 93.9 93.5 93.5 93.6 95.4  MCH 29.6   < > 30.0 29.6 29.7 29.6 30.0  MCHC 31.9   < > 31.9 31.6 31.7 31.6 31.4  RDW 16.5*   < > 16.5* 16.8* 16.8* 16.4* 17.0*  LYMPHSABS 0.9  --  0.9 1.0 1.0 1.1  --   MONOABS 0.5  --  0.4 0.5 0.5 0.6  --   EOSABS 0.1  --  0.1 0.1 0.1 0.1   --   BASOSABS 0.0  --  0.0 0.0 0.0 0.1  --    < > = values in this interval not displayed.    Chemistries  Recent Labs  Lab 07/24/19 1237 07/24/19 1237 07/25/19 0858 07/26/19 0350 07/27/19 0537 07/28/19 0431 07/29/19 0434  NA 139   < > 138 136 139 136 137  K 4.1   < > 4.2 3.6 4.1 4.2 4.1  CL 93*   < > 93* 94* 95* 94* 96*  CO2 34*   < > 34* 35* 33* 32 33*  GLUCOSE 116*   < > 150* 119* 127* 119* 119*  BUN 18   < > 16 16 17 19 16   CREATININE 0.95   < > 1.00 0.92 0.93 0.96 0.90  CALCIUM 9.1   < > 8.9 8.7* 9.1 8.9 9.1  MG 2.0  --  2.0 2.0 2.0 1.9  --   AST 36  --  35 36 35 35  --   ALT 22  --  19 18 19 20   --   ALKPHOS 284*  --  268* 253* 287* 273*  --   BILITOT 2.8*  --  2.6* 2.3* 2.9* 2.8*  --    < > = values in this interval not displayed.   ------------------------------------------------------------------------------------------------------------------ No results for input(s): CHOL, HDL, LDLCALC, TRIG, CHOLHDL, LDLDIRECT in the last 72 hours.  Lab Results  Component Value Date   HGBA1C 6.1 (H) 07/21/2019   ------------------------------------------------------------------------------------------------------------------ No results for input(s): TSH, T4TOTAL, T3FREE, THYROIDAB in the last 72 hours.  Invalid input(s): FREET3 ------------------------------------------------------------------------------------------------------------------ No results for input(s): VITAMINB12, FOLATE, FERRITIN, TIBC, IRON, RETICCTPCT in the last 72 hours.  Coagulation profile No results for input(s): INR, PROTIME in the last 168 hours.  No results for input(s): DDIMER in the last 72 hours.  Cardiac Enzymes No results for input(s): CKMB, TROPONINI, MYOGLOBIN in the last 168 hours.  Invalid input(s): CK ------------------------------------------------------------------------------------------------------------------    Component Value Date/Time   BNP 1,142.6 (H) 07/21/2019 1316     Inpatient Medications  Scheduled Meds: . aspirin EC  81 mg Oral Daily  . docusate sodium  100 mg Oral BID  . enoxaparin (LOVENOX) injection  60 mg Subcutaneous Q24H  . feeding supplement (ENSURE ENLIVE)  237 mL Oral Q1500  . feeding supplement (PRO-STAT SUGAR FREE 64)  30 mL Oral Daily  . furosemide  40 mg Intravenous Q12H  . levothyroxine  25 mcg Oral Q0600  . multivitamin with minerals  1 tablet Oral Daily  . saccharomyces boulardii  250 mg Oral BID  . sodium chloride flush  10-40 mL Intracatheter Q12H  . spironolactone  25 mg Oral  Daily   Continuous Infusions:  PRN Meds:.acetaminophen **OR** [DISCONTINUED] acetaminophen, bisacodyl, hydrALAZINE, HYDROcodone-acetaminophen, [DISCONTINUED] ondansetron **OR** ondansetron (ZOFRAN) IV, polyethylene glycol  Micro Results Recent Results (from the past 240 hour(s))  Culture, blood (routine x 2)     Status: None   Collection Time: 07/21/19 12:37 PM   Specimen: BLOOD RIGHT HAND  Result Value Ref Range Status   Specimen Description BLOOD RIGHT HAND  Final   Special Requests   Final    BOTTLES DRAWN AEROBIC ONLY Blood Culture results may not be optimal due to an inadequate volume of blood received in culture bottles   Culture   Final    NO GROWTH 5 DAYS Performed at [facility], [place] [address] [place], [place] [address]    Report Status 07/26/2019 FINAL  Final  SARS Coronavirus 2 by RT PCR (hospital order, performed in [facility] hospital lab) Nasopharyngeal Nasopharyngeal Swab     Status: Abnormal   Collection Time: 07/21/19 12:37 PM   Specimen: Nasopharyngeal Swab  Result Value Ref Range Status   SARS Coronavirus 2 POSITIVE (A) NEGATIVE Final    Comment: RESULT CALLED TO, READ BACK BY AND VERIFIED WITH: Angelina Ok RN 16:45 07/21/19 ([email or web address]) (NOTE) SARS-CoV-2 target nucleic acids are DETECTED SARS-CoV-2 RNA is generally detectable in upper respiratory specimens  during the acute phase of infection.  Positive  results are indicative  of the presence of the identified virus, but do not rule out bacterial infection or co-infection with other pathogens not detected by the test.  Clinical correlation with patient history and  other diagnostic information is necessary to determine patient infection status.  The expected result is negative. Fact Sheet for Patients:   StrictlyIdeas.no  Fact Sheet for Healthcare Providers:   BankingDealers.co.za   This test is not yet approved or cleared by the [place] FDA and  has been authorized for detection and/or diagnosis of SARS-CoV-2 by FDA under an Emergency Use Authorization (EUA).  This EUA will remain in effect (meaning this  test can be used) for the duration of  the COVID-19 declaration under Section 564(b)(1) of the Act, 21 U.S.C. section 360-bbb-3(b)(1), unless the authorization is terminated or revoked sooner. Performed at [facility], [place] [address], [place], [place] [address]   Culture, blood (routine x 2)     Status: None   Collection Time: 07/21/19 12:38 PM   Specimen: BLOOD  Result Value Ref Range Status   Specimen Description BLOOD RIGHT ANTECUBITAL  Final   Special Requests   Final    BOTTLES DRAWN AEROBIC AND ANAEROBIC Blood Culture results may not be optimal due to an inadequate volume of blood received in culture bottles   Culture   Final    NO GROWTH 5 DAYS Performed at [facility], [place] [address], [place], [place] [address]    Report Status 07/26/2019 FINAL  Final    Radiology Reports DG Chest 2 View  Result Date: 07/21/2019 CLINICAL DATA:  Chest pain and shortness of breath EXAM: CHEST - 2 VIEW COMPARISON:  06/25/2018 FINDINGS: Congested appearance of vessels with small pleural effusions. Cardiomegaly that is chronic. Negative aortic contours. IMPRESSION: Cardiomegaly with vascular congestion and small pleural effusions. Electronically Signed   By: Monte Fantasia  M.D.   On: 07/21/2019 08:42   CARDIAC CATHETERIZATION  Result Date: 07/28/2019  There is severe aortic valve stenosis.  1.  Patent coronary arteries with no obstructive CAD 2.  Severe calcification and restriction of  the aortic valve leaflets with hemodynamic evidence of critical aortic stenosis.  Mean aortic gradient 57 mmHg, peak gradient 108 mmHg, calculated aortic valve area 0.6 cm 3.  Elevated right and left heart pressures consistent with congestive heart failure Recommendations: Continued heart team evaluation for treatment options of critical aortic stenosis.  Continue IV diuresis as tolerated.  We will ask for inpatient cardiac surgical evaluation to help determine treatment plan.   CT CORONARY MORPH W/CTA COR W/SCORE W/CA W/CM &/OR WO/CM  Addendum Date: 07/27/2019   ADDENDUM REPORT: 07/27/2019 12:22 ADDENDUM: Extracardiac findings are described separately under dictation for contemporaneously obtained CTA chest, abdomen and pelvis performed on 07/25/2019. Please see dictation for that examination for description of relevant extracardiac findings. Electronically Signed   By: Vinnie Langton M.D.   On: 07/27/2019 12:22   Result Date: 07/27/2019 CLINICAL DATA:  54 year old male with severe aortic stenosis being evaluated for a TAVR procedure. EXAM: Cardiac TAVR CT TECHNIQUE: The patient was scanned on a [employer]. A 120 kV retrospective scan was triggered in the descending thoracic aorta at 111 HU's. Gantry rotation speed was 250 msecs and collimation was .6 mm. No beta blockade or nitro were given. The 3D data set was reconstructed in 5% intervals of the R-R cycle. Systolic and diastolic phases were analyzed on a dedicated work station using MPR, MIP and VRT modes. The patient received 80 cc of contrast. FINDINGS: Aortic Valve: Trileaflet aortic valve with severely calcified and thickened leaflets and only mild calcifications extending into the LVOT. Aorta: Normal size, minimal  plaque, no calcifications, no dissection. Sinotubular Junction: 30 x 29 mm Ascending Thoracic Aorta: 35 x 34 mm Aortic Arch: Not visualized. Descending Thoracic Aorta: 22 x 22 mm Sinus of Valsalva Measurements: Non-coronary: 34 mm Right -coronary: 31 mm Left -coronary: 35 mm Coronary Artery Height above Annulus: Left Main: 13.1 mm Right Coronary: 18.2 mm Virtual Basal Annulus Measurements: Maximum/Minimum Diameter: 34.0 x 25.7 mm Mean Diameter: 28.2 mm Perimeter: 94.1 mm Area: 626 mm2 Coronary Arteries: Calcium score is 0. Coronary Arteries:  Normal coronary origin.  Right dominance. RCA is a large dominant artery that gives rise to PDA and PLA. There is no plaque. Left main is a large artery that gives rise to LAD and LCX arteries. Left main has no plaque. LAD is a large vessel that has no plaque. LCX is a non-dominant artery that gives rise to one large OM1 branch. There is no plaque. Optimum Fluoroscopic Angle for Delivery: RAO 0 CRA 0 IMPRESSION: 1. Trileaflet aortic valve with severely calcified and thickened leaflets and only mild calcifications extending into the LVOT. Aortic valve calcium score 7968 consistent with severe aortic stenosis. Annular measurements suitable for delivery of a 29 mm Edwards-SAPIEN 3 valve. 2. Sufficient coronary to annulus distance. 3. Optimum Fluoroscopic Angle for Delivery:  RAO 0 CRA 0. 4. No thrombus in the left atrial appendage. 5. Coronary Arteries: Normal origin. Right dominance. Calcium score is 0. There are only minimal irregularities. 6. Dilated pulmonary artery measuring 35 suggestive of pulmonary hypertension. Electronically Signed: By: Ena Dawley On: 07/26/2019 13:01   CT ANGIO CHEST AORTA W/CM & OR WO/CM  Result Date: 07/27/2019 CLINICAL DATA:  54 year old male with history of severe aortic stenosis. Preprocedural study prior to potential transcatheter aortic valve replacement (TAVR) procedure. EXAM: CT ANGIOGRAPHY CHEST, ABDOMEN AND PELVIS TECHNIQUE:  Non-contrast CT of the chest was initially obtained. Multidetector CT imaging through the chest, abdomen and pelvis was performed using the standard protocol  during bolus administration of intravenous contrast. Multiplanar reconstructed images and MIPs were obtained and reviewed to evaluate the vascular anatomy. CONTRAST:  152m OMNIPAQUE IOHEXOL 350 MG/ML SOLN COMPARISON:  No priors. FINDINGS: CTA CHEST FINDINGS Cardiovascular: Heart size is mildly enlarged. There is no significant pericardial fluid, thickening or pericardial calcification. Aortic atherosclerosis. No definite coronary artery calcifications. Severe thickening calcification of the aortic valve. Dilatation of the pulmonic trunk (3.6 cm in diameter). Mediastinum/Lymph Nodes: No pathologically enlarged mediastinal or hilar lymph nodes. Esophagus is unremarkable in appearance. No axillary lymphadenopathy. Lungs/Pleura: Patchy but diffuse ground-glass attenuation and mild interlobular septal thickening noted throughout the lungs bilaterally, suggesting a background of mild interstitial pulmonary edema. Moderate bilateral pleural effusions lying dependently with some associated passive subsegmental atelectasis in the lower lobes of the lungs bilaterally. No definite suspicious appearing pulmonary nodules or masses. Musculoskeletal/Soft Tissues: Old healed fracture of the mid sternum with mild posttraumatic deformity. There are no aggressive appearing lytic or blastic lesions noted in the visualized portions of the skeleton. CTA ABDOMEN AND PELVIS FINDINGS Hepatobiliary: Liver has a slightly shrunken appearance and nodular contour, indicative of underlying cirrhosis. No discrete cystic or solid hepatic lesions. No intra or extrahepatic biliary ductal dilatation. Gallbladder is nearly completely decompressed, and otherwise unremarkable in appearance. Pancreas: No pancreatic mass. No pancreatic ductal dilatation. No pancreatic or peripancreatic fluid  collections or inflammatory changes. Spleen: Unremarkable. Adrenals/Urinary Tract: Left kidney and bilateral adrenal glands are normal in appearance. In the lower pole of the right kidney there is an exophytic low-attenuation nonenhancing lesion measuring 8 cm in diameter, compatible with a simple cyst. No hydroureteronephrosis. Amorphous mass-like area of enhancement along the posterior wall of the urinary bladder measuring approximately 6.4 x 2.0 x 2.5 cm (axial image 214 of series 6 and sagittal image 111 of series 9). Stomach/Bowel: Appearance of the stomach is normal. No pathologic dilatation of small bowel or colon. A few scattered colonic diverticulae are noted, without definite surrounding inflammatory changes to suggest an acute diverticulitis (study is limited by presence of small volume of ascites). Normal appendix. Vascular/Lymphatic: Aortic atherosclerosis with vascular findings and measurements pertinent to potential TAVR procedure, as detailed below. No aneurysm or dissection noted in the abdominal or pelvic vasculature. No lymphadenopathy noted in the abdomen or pelvis. Reproductive: Prostate gland and seminal vesicles are unremarkable in appearance. Other: Small volume of ascites.  No pneumoperitoneum. Musculoskeletal: There are no aggressive appearing lytic or blastic lesions noted in the visualized portions of the skeleton. Mild diffuse body wall edema. VASCULAR MEASUREMENTS PERTINENT TO TAVR: AORTA: Minimal Aortic Diameter-16 x 16 mm Severity of Aortic Calcification-mild RIGHT PELVIS: Right Common Iliac Artery - Minimal Diameter-9.0 x 11.1 mm Tortuosity - mild Calcification-minimal Right External Iliac Artery - Minimal Diameter-9.5 x 9.1 mm Tortuosity - mild Calcification-none Right Common Femoral Artery - Minimal Diameter-6.4 x 8.6 mm Tortuosity - mild Calcification-none LEFT PELVIS: Left Common Iliac Artery - Minimal Diameter-11.6 x 9.9 mm Tortuosity - mild Calcification-minimal Left External  Iliac Artery - Minimal Diameter-11.5 x 8.8 mm Tortuosity - mild Calcification-none Left Common Femoral Artery - Minimal Diameter-9.1 x 8.7 mm Tortuosity-mild Calcification-none Review of the MIP images confirms the above findings. IMPRESSION: 1. Vascular findings and measurements pertinent to potential TAVR procedure, as detailed above. 2. Severe thickening calcification of the aortic valve, compatible with reported clinical history of severe aortic stenosis. 3. Cardiomegaly with evidence of mild interstitial pulmonary edema in the lungs and moderate bilateral pleural effusions; imaging findings concerning for congestive heart failure. 4. Amorphous  mass-like area of enhancement along the posterior wall of the urinary bladder measuring approximately 6.4 x 2.0 x 2.5 cm. Nonemergent Urologic consultation is strongly recommended in the near future to better evaluate this finding, as the possibility of an infiltrative bladder neoplasm is not excluded. 5. There is also dilatation of the pulmonic trunk (3.6 cm in diameter), concerning for pulmonary arterial hypertension. 6. Morphologic changes in the liver suggesting early cirrhosis. 7. Small volume of ascites. 8. Mild diffuse body wall edema. 9. Additional incidental findings, as above. These results will be called to the ordering clinician or representative by the Radiologist Assistant, and communication documented in the PACS or [employer]. Electronically Signed   By: Vinnie Langton M.D.   On: 07/27/2019 12:47   ECHOCARDIOGRAM COMPLETE  Result Date: 07/21/2019    ECHOCARDIOGRAM REPORT   Patient Name:   Steve Richards Date of Exam: 07/21/2019 Medical Rec #:  [id number]   Height:       72.0 in Accession #:    [id number]  Weight:       310.0 lb Date of Birth:  [birth year]   BSA:          2.567 m Patient Age:    31 years    BP:           102/73 mmHg Patient Gender: M           HR:           73 bpm. Exam Location:  Inpatient Procedure: 2D Echo Indications:    CHF-Acute  Systolic 993.71 / I96.78  History:        Patient has no prior history of Echocardiogram examinations.                 Risk Factors:Obesity. Pleural effusion on right.  Sonographer:    Vikki Ports Turrentine Referring Phys: [place]  1. Left ventricular ejection fraction, by estimation, is 40 to 45%. The left ventricle has mildly decreased function. The left ventricle demonstrates global hypokinesis. There is moderate left ventricular hypertrophy. Left ventricular diastolic parameters are consistent with Grade II diastolic dysfunction (pseudonormalization).  2. Right ventricular systolic function is mildly reduced. The right ventricular size is normal. There is moderately elevated pulmonary artery systolic pressure.  3. Left atrial size was moderately dilated.  4. The mitral valve is abnormal. Trivial mitral valve regurgitation.  5. Cannot exclude a bicuspid valve. Aortic valve regurgitation is mild. Critical aortic valve stenosis. Aortic valve area, by VTI measures 0.73 cm. Aortic valve mean gradient measures 96.2 mmHg. Peak gradient of 137 mmHg. Aortic valve Vmax measures 5.86 m/s.  6. The inferior vena cava is dilated in size with <50% respiratory variability, suggesting right atrial pressure of 15 mmHg.  7. Moderate pleural effusion in the right lateral region. FINDINGS  Left Ventricle: Left ventricular ejection fraction, by estimation, is 40 to 45%. The left ventricle has mildly decreased function. The left ventricle demonstrates global hypokinesis. The left ventricular internal cavity size was normal in size. There is  moderate left ventricular hypertrophy. Left ventricular diastolic parameters are consistent with Grade II diastolic dysfunction (pseudonormalization). Indeterminate filling pressures. Right Ventricle: The right ventricular size is normal. No increase in right ventricular wall thickness. Right ventricular systolic function is mildly reduced. There is moderately elevated  pulmonary artery systolic pressure. The tricuspid regurgitant velocity is 3.35 m/s, and with an assumed right atrial pressure of 15 mmHg, the estimated right ventricular systolic pressure is 93.8 mmHg. Left Atrium: Left atrial  size was moderately dilated. Right Atrium: Right atrial size was normal in size. Pericardium: There is no evidence of pericardial effusion. Mitral Valve: The mitral valve is abnormal. Mild to moderate mitral annular calcification. Trivial mitral valve regurgitation. Tricuspid Valve: The tricuspid valve is grossly normal. Tricuspid valve regurgitation is mild. Aortic Valve: The aortic valve is tricuspid. . There is moderate thickening and severe calcifcation of the aortic valve. Aortic valve regurgitation is mild. Aortic regurgitation PHT measures 374 msec. Severe aortic stenosis is present. There is moderate thickening of the aortic valve. There is severe calcifcation of the aortic valve. Aortic valve mean gradient measures 96.2 mmHg. Aortic valve peak gradient measures 137.3 mmHg. Aortic valve area, by VTI measures 0.73 cm. Pulmonic Valve: The pulmonic valve was normal in structure. Pulmonic valve regurgitation is not visualized. Aorta: The aortic root and ascending aorta are structurally normal, with no evidence of dilitation. Venous: The inferior vena cava is dilated in size with less than 50% respiratory variability, suggesting right atrial pressure of 15 mmHg. IAS/Shunts: No atrial level shunt detected by color flow Doppler. Additional Comments: There is a moderate pleural effusion in the right lateral region.  LEFT VENTRICLE PLAX 2D LVIDd:         5.50 cm  Diastology LVIDs:         4.40 cm  LV e' lateral:   9.23 cm/s LV PW:         1.40 cm  LV E/e' lateral: 10.2 LV IVS:        1.40 cm  LV e' medial:    7.05 cm/s LVOT diam:     2.50 cm  LV E/e' medial:  13.4 LV SV:         108 LV SV Index:   42 LVOT Area:     4.91 cm  RIGHT VENTRICLE RV S prime:     7.20 cm/s TAPSE (M-mode): 1.2 cm LEFT  ATRIUM              Index       RIGHT ATRIUM           Index LA diam:        5.90 cm  2.30 cm/m  RA Area:     22.60 cm LA Vol (A2C):   121.0 ml 47.14 ml/m RA Volume:   68.80 ml  26.80 ml/m LA Vol (A4C):   95.5 ml  37.21 ml/m LA Biplane Vol: 110.0 ml 42.86 ml/m  AORTIC VALVE AV Area (Vmax):    0.82 cm AV Area (Vmean):   0.72 cm AV Area (VTI):     0.73 cm AV Vmax:           585.80 cm/s AV Vmean:          474.000 cm/s AV VTI:            1.480 m AV Peak Grad:      137.3 mmHg AV Mean Grad:      96.2 mmHg LVOT Vmax:         97.30 cm/s LVOT Vmean:        69.800 cm/s LVOT VTI:          0.220 m LVOT/AV VTI ratio: 0.15 AI PHT:            374 msec  AORTA Ao Root diam: 3.00 cm MITRAL VALVE               TRICUSPID VALVE MV Area (PHT): 5.54 cm    TR  Peak grad:   44.9 mmHg MV Decel Time: 137 msec    TR Vmax:        335.00 cm/s MV E velocity: 94.30 cm/s MV A velocity: 46.30 cm/s  SHUNTS MV E/A ratio:  2.04        Systemic VTI:  0.22 m                            Systemic Diam: 2.50 cm Lyman Bishop MD Electronically signed by Lyman Bishop MD Signature Date/Time: 07/21/2019/3:42:23 PM    Final    VAS US CAROTID  Result Date: 07/27/2019 Carotid Arterial Duplex Study Indications:       Covid-19. Other Factors:     Critical Aortic Stenosis, CHF, Morbid Obesity. Limitations        Today's exam was limited due to the body habitus of the                    patient. Comparison Study:  No prior study on file for comparison Performing Technologist: Sharion Dove RVS  Examination Guidelines: A complete evaluation includes B-mode imaging, spectral Doppler, color Doppler, and power Doppler as needed of all accessible portions of each vessel. Bilateral testing is considered an integral part of a complete examination. Limited examinations for reoccurring indications may be performed as noted.  Right Carotid Findings: +----------+--------+--------+--------+------------------+------------------+           PSV cm/sEDV  cm/sStenosisPlaque DescriptionComments           +----------+--------+--------+--------+------------------+------------------+ CCA Prox  41      7                                 intimal thickening +----------+--------+--------+--------+------------------+------------------+ CCA Distal51      12                                intimal thickening +----------+--------+--------+--------+------------------+------------------+ ICA Prox  42      12              heterogenous                         +----------+--------+--------+--------+------------------+------------------+ ICA Distal48      15                                                   +----------+--------+--------+--------+------------------+------------------+ ECA       62      10                                                   +----------+--------+--------+--------+------------------+------------------+ +----------+--------+-------+--------+-------------------+           PSV cm/sEDV cmsDescribeArm Pressure (mmHG) +----------+--------+-------+--------+-------------------+ [id number]                                         +----------+--------+-------+--------+-------------------+ +---------+--------+--+--------+-+ VertebralPSV cm/s28EDV cm/s9 +---------+--------+--+--------+-+  Left Carotid Findings: +----------+--------+--------+--------+------------------+------------------+  PSV cm/sEDV cm/sStenosisPlaque DescriptionComments           +----------+--------+--------+--------+------------------+------------------+ CCA Prox  73      12                                intimal thickening +----------+--------+--------+--------+------------------+------------------+ CCA Distal43      9                                 intimal thickening +----------+--------+--------+--------+------------------+------------------+ ICA Prox  54      11                                                    +----------+--------+--------+--------+------------------+------------------+ ICA Distal67      19                                                   +----------+--------+--------+--------+------------------+------------------+ ECA       53      5                                                    +----------+--------+--------+--------+------------------+------------------+ +----------+--------+--------+--------+-------------------+           PSV cm/sEDV cm/sDescribeArm Pressure (mmHG) +----------+--------+--------+--------+-------------------+ Subclavian113                                         +----------+--------+--------+--------+-------------------+ +---------+--------+--+--------+--+ VertebralPSV cm/s75EDV cm/s20 +---------+--------+--+--------+--+   Summary: Right Carotid: The extracranial vessels were near-normal with only minimal wall                thickening or plaque. Left Carotid: The extracranial vessels were near-normal with only minimal wall               thickening or plaque. Vertebrals:  Bilateral vertebral arteries demonstrate antegrade flow. Subclavians: Normal flow hemodynamics were seen in bilateral subclavian              arteries. *See table(s) above for measurements and observations.  Electronically signed by Servando Snare MD on 07/27/2019 at 12:53:06 AM.    Final    VAS [place] LOWER EXTREMITY VENOUS (DVT)  Result Date: 07/23/2019  Lower Venous DVTStudy Indications: Edema, and elevated ddimer.  Limitations: Body habitus and poor ultrasound/tissue interface. Comparison Study: no prior Performing Technologist: Abram Sander RVS  Examination Guidelines: A complete evaluation includes B-mode imaging, spectral Doppler, color Doppler, and power Doppler as needed of all accessible portions of each vessel. Bilateral testing is considered an integral part of a complete examination. Limited examinations for reoccurring indications may be performed as noted. The  reflux portion of the exam is performed with the patient in reverse Trendelenburg.  +---------+---------------+---------+-----------+----------+--------------+ RIGHT    CompressibilityPhasicitySpontaneityPropertiesThrombus Aging +---------+---------------+---------+-----------+----------+--------------+ CFV      Full           Yes      Yes                                 +---------+---------------+---------+-----------+----------+--------------+  SFJ      Full                                                        +---------+---------------+---------+-----------+----------+--------------+ FV Prox  Full                                                        +---------+---------------+---------+-----------+----------+--------------+ FV Mid                  Yes      Yes                                 +---------+---------------+---------+-----------+----------+--------------+ FV Distal               Yes      Yes                                 +---------+---------------+---------+-----------+----------+--------------+ PFV      Full                                                        +---------+---------------+---------+-----------+----------+--------------+ POP      Full           Yes      Yes                                 +---------+---------------+---------+-----------+----------+--------------+ PTV      Full                                                        +---------+---------------+---------+-----------+----------+--------------+ PERO     Full                                                        +---------+---------------+---------+-----------+----------+--------------+   +---------+---------------+---------+-----------+----------+--------------+ LEFT     CompressibilityPhasicitySpontaneityPropertiesThrombus Aging +---------+---------------+---------+-----------+----------+--------------+ CFV      Full           Yes       Yes                                 +---------+---------------+---------+-----------+----------+--------------+ SFJ      Full                                                        +---------+---------------+---------+-----------+----------+--------------+  FV Prox  Full                                                        +---------+---------------+---------+-----------+----------+--------------+ FV Mid   Full                                                        +---------+---------------+---------+-----------+----------+--------------+ FV Distal               Yes      Yes                                 +---------+---------------+---------+-----------+----------+--------------+ PFV      Full                                                        +---------+---------------+---------+-----------+----------+--------------+ POP      Full           Yes      Yes                                 +---------+---------------+---------+-----------+----------+--------------+ PTV      Full                                                        +---------+---------------+---------+-----------+----------+--------------+ PERO                                                  Not visualized +---------+---------------+---------+-----------+----------+--------------+     Summary: BILATERAL: - No evidence of deep vein thrombosis seen in the lower extremities, bilaterally. -   *See table(s) above for measurements and observations. Electronically signed by Monica Martinez MD on 07/23/2019 at 5:31:24 PM.    Final    CT Angio Abd/Pel w/ and/or w/o  Result Date: 07/27/2019 CLINICAL DATA:  54 year old male with history of severe aortic stenosis. Preprocedural study prior to potential transcatheter aortic valve replacement (TAVR) procedure. EXAM: CT ANGIOGRAPHY CHEST, ABDOMEN AND PELVIS TECHNIQUE: Non-contrast CT of the chest was initially obtained. Multidetector CT imaging  through the chest, abdomen and pelvis was performed using the standard protocol during bolus administration of intravenous contrast. Multiplanar reconstructed images and MIPs were obtained and reviewed to evaluate the vascular anatomy. CONTRAST:  125m OMNIPAQUE IOHEXOL 350 MG/ML SOLN COMPARISON:  No priors. FINDINGS: CTA CHEST FINDINGS Cardiovascular: Heart size is mildly enlarged. There is no significant pericardial fluid, thickening or pericardial calcification. Aortic atherosclerosis. No definite coronary artery calcifications. Severe thickening calcification of the aortic valve. Dilatation of the pulmonic trunk (3.6 cm in diameter). Mediastinum/Lymph  Nodes: No pathologically enlarged mediastinal or hilar lymph nodes. Esophagus is unremarkable in appearance. No axillary lymphadenopathy. Lungs/Pleura: Patchy but diffuse ground-glass attenuation and mild interlobular septal thickening noted throughout the lungs bilaterally, suggesting a background of mild interstitial pulmonary edema. Moderate bilateral pleural effusions lying dependently with some associated passive subsegmental atelectasis in the lower lobes of the lungs bilaterally. No definite suspicious appearing pulmonary nodules or masses. Musculoskeletal/Soft Tissues: Old healed fracture of the mid sternum with mild posttraumatic deformity. There are no aggressive appearing lytic or blastic lesions noted in the visualized portions of the skeleton. CTA ABDOMEN AND PELVIS FINDINGS Hepatobiliary: Liver has a slightly shrunken appearance and nodular contour, indicative of underlying cirrhosis. No discrete cystic or solid hepatic lesions. No intra or extrahepatic biliary ductal dilatation. Gallbladder is nearly completely decompressed, and otherwise unremarkable in appearance. Pancreas: No pancreatic mass. No pancreatic ductal dilatation. No pancreatic or peripancreatic fluid collections or inflammatory changes. Spleen: Unremarkable. Adrenals/Urinary Tract:  Left kidney and bilateral adrenal glands are normal in appearance. In the lower pole of the right kidney there is an exophytic low-attenuation nonenhancing lesion measuring 8 cm in diameter, compatible with a simple cyst. No hydroureteronephrosis. Amorphous mass-like area of enhancement along the posterior wall of the urinary bladder measuring approximately 6.4 x 2.0 x 2.5 cm (axial image 214 of series 6 and sagittal image 111 of series 9). Stomach/Bowel: Appearance of the stomach is normal. No pathologic dilatation of small bowel or colon. A few scattered colonic diverticulae are noted, without definite surrounding inflammatory changes to suggest an acute diverticulitis (study is limited by presence of small volume of ascites). Normal appendix. Vascular/Lymphatic: Aortic atherosclerosis with vascular findings and measurements pertinent to potential TAVR procedure, as detailed below. No aneurysm or dissection noted in the abdominal or pelvic vasculature. No lymphadenopathy noted in the abdomen or pelvis. Reproductive: Prostate gland and seminal vesicles are unremarkable in appearance. Other: Small volume of ascites.  No pneumoperitoneum. Musculoskeletal: There are no aggressive appearing lytic or blastic lesions noted in the visualized portions of the skeleton. Mild diffuse body wall edema. VASCULAR MEASUREMENTS PERTINENT TO TAVR: AORTA: Minimal Aortic Diameter-16 x 16 mm Severity of Aortic Calcification-mild RIGHT PELVIS: Right Common Iliac Artery - Minimal Diameter-9.0 x 11.1 mm Tortuosity - mild Calcification-minimal Right External Iliac Artery - Minimal Diameter-9.5 x 9.1 mm Tortuosity - mild Calcification-none Right Common Femoral Artery - Minimal Diameter-6.4 x 8.6 mm Tortuosity - mild Calcification-none LEFT PELVIS: Left Common Iliac Artery - Minimal Diameter-11.6 x 9.9 mm Tortuosity - mild Calcification-minimal Left External Iliac Artery - Minimal Diameter-11.5 x 8.8 mm Tortuosity - mild Calcification-none  Left Common Femoral Artery - Minimal Diameter-9.1 x 8.7 mm Tortuosity-mild Calcification-none Review of the MIP images confirms the above findings. IMPRESSION: 1. Vascular findings and measurements pertinent to potential TAVR procedure, as detailed above. 2. Severe thickening calcification of the aortic valve, compatible with reported clinical history of severe aortic stenosis. 3. Cardiomegaly with evidence of mild interstitial pulmonary edema in the lungs and moderate bilateral pleural effusions; imaging findings concerning for congestive heart failure. 4. Amorphous mass-like area of enhancement along the posterior wall of the urinary bladder measuring approximately 6.4 x 2.0 x 2.5 cm. Nonemergent Urologic consultation is strongly recommended in the near future to better evaluate this finding, as the possibility of an infiltrative bladder neoplasm is not excluded. 5. There is also dilatation of the pulmonic trunk (3.6 cm in diameter), concerning for pulmonary arterial hypertension. 6. Morphologic changes in the liver suggesting early cirrhosis. 7. Small volume  of ascites. 8. Mild diffuse body wall edema. 9. Additional incidental findings, as above. These results will be called to the ordering clinician or representative by the Radiologist Assistant, and communication documented in the PACS or [employer]. Electronically Signed   By: Vinnie Langton M.D.   On: 07/27/2019 12:47    Lala Lund M.D on 07/29/2019 at 1:25 PM   [facility] -  Office  [phone]

## 2019-07-30 ENCOUNTER — Ambulatory Visit (HOSPITAL_BASED_OUTPATIENT_CLINIC_OR_DEPARTMENT_OTHER): Payer: 59 | Admitting: Internal Medicine

## 2019-07-30 LAB — PROCALCITONIN: Procalcitonin: <0.1 ng/mL

## 2019-07-30 LAB — CBC
HCT: 42.1 % (ref 39.0–52.0)
Hemoglobin: 13.5 g/dL (ref 13.0–17.0)
MCH: 30.1 pg (ref 26.0–34.0)
MCHC: 32.1 g/dL (ref 30.0–36.0)
MCV: 93.8 fL (ref 80.0–100.0)
Platelets: 205 10*3/uL (ref 150–400)
RBC: 4.49 MIL/uL (ref 4.22–5.81)
RDW: 16.9 % — ABNORMAL HIGH (ref 11.5–15.5)
WBC: 8.3 10*3/uL (ref 4.0–10.5)
nRBC: 0 % (ref 0.0–0.2)

## 2019-07-30 LAB — BASIC METABOLIC PANEL
Anion gap: 9 (ref 5–15)
BUN: 19 mg/dL (ref 6–20)
CO2: 31 mmol/L (ref 22–32)
Calcium: 8.9 mg/dL (ref 8.9–10.3)
Chloride: 95 mmol/L — ABNORMAL LOW (ref 98–111)
Creatinine, Ser: 0.99 mg/dL (ref 0.61–1.24)
GFR calc Af Amer: >60 mL/min (ref >60)
GFR calc non Af Amer: >60 mL/min (ref >60)
Glucose, Bld: 128 mg/dL — ABNORMAL HIGH (ref 70–99)
Potassium: 4.3 mmol/L (ref 3.5–5.1)
Sodium: 135 mmol/L (ref 135–145)

## 2019-07-30 LAB — C-REACTIVE PROTEIN: CRP: 1.6 mg/dL — ABNORMAL HIGH (ref <1.0)

## 2019-07-30 LAB — MAGNESIUM: Magnesium: 1.9 mg/dL (ref 1.7–2.4)

## 2019-07-30 MED ORDER — FUROSEMIDE 40 MG PO TABS
40.0000 mg | ORAL_TABLET | Freq: Two times a day (BID) | ORAL | Status: DC
  Administered 2019-07-30 – 2019-07-31 (×2): 40 mg via ORAL
  Filled 2019-07-30 (×2): qty 1

## 2019-07-30 MED ORDER — SPIRONOLACTONE 12.5 MG HALF TABLET
12.5000 mg | ORAL_TABLET | Freq: Every day | ORAL | Status: DC
  Filled 2019-07-30: qty 1

## 2019-07-30 NOTE — Progress Notes (Signed)
Brief cardiology update: Net negative 28 L since admission, weight 118.9 kg from admission 140.6 kg.  Renal function stable. Would continue diuresis as long as renal function allows to optimize from a cardiovascular standpoint.  We will follow peripherally, and structural team also following peripherally.   Buford Dresser, MD, PhD [facility]  [address], [place], [place] [address] [phone]

## 2019-07-30 NOTE — Progress Notes (Signed)
PROGRESS NOTE                                                                                                                                                                                                             Patient Demographics:    Steve Richards, is a 54 y.o. male, DOB - 07-16-1965, WOE:[id number]  Admit date - 07/21/2019   Admitting Physician Karmen Bongo, MD  Outpatient Primary MD for the patient is System, Pcp Not In  LOS - 9   Chief Complaint  Patient presents with  . Shortness of Breath  . Leg Swelling       Brief Narrative     Steve Richards is a 54 y.o. male with medical history significant of R pleural effusion in April 2021 presenting with SOB and LE edema.    Patient did not follow with PCP for last 6 years, patient with recent work-up as an outpatient and negative for CHF, volume overload with thoracentesis status post drain, as well evidence of liver cirrhosis, and recent diagnosis of left lower extremity cellulitis where he was started on Keflex, ED secondary to dyspnea, evidence of volume overload, as well he tested Covid positive on admission .  His work-up was significant for volume overload, with severe aortic stenosis, acute systolic/diastolic CHF, he has been seen by cardiology/structural cardiology team for his severe aortic stenosis, imaging was significant for questionable bladder mass, urology has been following as well.   Subjective:   Patient in bed, appears comfortable, denies any headache, no fever, no chest pain or pressure, no shortness of breath , no abdominal pain. No focal weakness.   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 he has not seen a physician for 6 to 7 years prior to admission, has been responding well to diuresis with IV Lasix which will be continued, cardiology on board.  Blood pressure too low to tolerate ACE, ARB or Entresto,  beta-blocker overall better from pulmonary standpoint.  Cardiothoracic surgery evaluating for severe AS.   Severe aortic stenosis - Cards following left heart cath on 07/28/2019 shows no significant CAD, cardiothoracic surgery will evaluate him for surgical correction of his severe aortic stenosis and likely to proceed with the procedure soon.  Also evaluated by TAVR team.  COVID-19 infection - incidental finding no treatment.   D-dimers elevated at 6 on admission, it is contrary it is currently trending down, tinea with current DVT prophylaxis dose, his venous Doppler negative for DVT .  COVID-19 Labs  Recent Labs    07/29/19 1803 07/30/19 0250  CRP 1.8* 1.6*    Lab Results  Component Value Date   SARSCOV2NAA POSITIVE (A) 07/21/2019   [place] NEGATIVE 06/23/2019    Liver cirrhosis - Patient denies any history of alcohol abuse in the past, this is most likely related to cardiac cirrhosis versus NASH.  Negative hepatitis panel, PCP to monitor and arrange for outpatient GI follow-up, if blood pressure allows will add low-dose beta-blocker.    Left lower extremity cellulitis - has been treated with antibiotics this admission and will nowl monitor clinically.  Some discoloration is due to massive edema.   Questionable findings of bladder mass - Urology consult done, bedside cystoscopy on 07/29/2019 unremarkable, outpatient follow-up with urology post discharge.  Hyperglycemia  -A1c is 6.1, prediabetic PCP to monitor.  Morbid obesity - BMI 35 follow with PCP for weight loss.  Hypothyroidism -TSH elevated at 14, with low normal free T4 at 0.65, started on low-dose Synthroid   Code Status : Full  Disposition Plan  :   Status is: Inpatient  Remains inpatient appropriate because:IV treatments appropriate due to intensity of illness or inability to take PO   Dispo: The patient is from: Home              Anticipated d/c is to: Home              Anticipated d/c date is: 3 days               Patient currently is not medically stable to d/c.  Patient still with significant volume overload, requiring further IV diuresis.  Plan for cardiac cath today, and bedside cystoscopy tomorrow.   Consults  :  Cardiology,Urology, cardiothoracic surgery  Procedures  :   Heart catheterization on 07/28/2019.     1.  Patent coronary arteries with no obstructive CAD 2.  Severe calcification and restriction of the aortic valve leaflets with hemodynamic evidence of critical aortic stenosis.  Mean aortic gradient 57 mmHg, peak gradient 108 mmHg, calculated aortic valve area 0.6 cm 3.  Elevated right and left heart pressures consistent with congestive heart failure   Bedside cystoscopy on 07/29/2019.  Unremarkable.  Bilateral leg ultrasound.  No DVT.    CTA -  1. Vascular findings and measurements pertinent to potential TAVR procedure, as detailed above. 2. Severe thickening calcification of the aortic valve, compatible with reported clinical history of severe aortic stenosis. 3. Cardiomegaly with evidence of mild interstitial pulmonary edema in the lungs and moderate bilateral pleural effusions; imaging findings concerning for congestive heart failure. 4. Amorphous mass-like area of enhancement along the posterior wall of the urinary bladder measuring approximately 6.4 x 2.0 x 2.5 cm. Nonemergent Urologic consultation is strongly recommended in the near future to better evaluate this finding, as the possibility of an infiltrative bladder neoplasm is not excluded. 5. There is also dilatation of the pulmonic trunk (3.6 cm in diameter), concerning for pulmonary arterial hypertension. 6. Morphologic changes in the liver suggesting early cirrhosis. 7. Small volume of ascites. 8. Mild diffuse body wall edema.  TTE  1. Left ventricular ejection fraction, by estimation, is 40 to 45%. The left ventricle has mildly decreased function. The left ventricle demonstrates global hypokinesis. There is moderate  left ventricular hypertrophy. Left ventricular diastolic parameters are consistent with Grade II diastolic dysfunction  (pseudonormalization).  2. Right ventricular systolic function is mildly reduced. The right ventricular size is normal. There is moderately elevated pulmonary artery  systolic pressure.  3. Left atrial size was moderately dilated.  4. The mitral valve is abnormal. Trivial mitral valve regurgitation.  5. Cannot exclude a bicuspid valve. Aortic valve regurgitation is mild. Critical aortic valve stenosis. Aortic valve area, by VTI measures 0.73  cm. Aortic valve mean gradient measures 96.2 mmHg. Peak gradient of 137 mmHg. Aortic valve Vmax measures 5.86 m/s.  6. The inferior vena cava is dilated in size with <50% respiratory variability, suggesting right atrial pressure of 15 mmHg.  7. Moderate pleural effusion in the right lateral region.     DVT Prophylaxis  :  [place] lovenox  Lab Results  Component Value Date   PLT 205 07/30/2019    Antibiotics  :   Anti-infectives (From admission, onward)   Start     Dose/Rate Route Frequency Ordered Stop   07/25/19 1530  doxycycline (VIBRA-TABS) tablet 100 mg  Status:  Discontinued     100 mg Oral Every 12 hours 07/25/19 1521 07/27/19 0809   07/22/19 1400  ceFAZolin (ANCEF) IVPB 2g/100 mL premix  Status:  Discontinued     2 g 200 mL/hr over 30 Minutes Intravenous Every 8 hours 07/22/19 1223 07/25/19 1521   07/22/19 0000  vancomycin (VANCOREADY) IVPB 1250 mg/250 mL  Status:  Discontinued     1,250 mg 166.7 mL/hr over 90 Minutes Intravenous Every 12 hours 07/21/19 1141 07/22/19 1223   07/21/19 1145  vancomycin (VANCOREADY) IVPB 2000 mg/400 mL     2,000 mg 200 mL/hr over 120 Minutes Intravenous  Once 07/21/19 1141 07/21/19 1509        Objective:   Vitals:   07/29/19 0730 07/29/19 1939 07/30/19 0512 07/30/19 0812  BP:  104/78 91/69 91/79   Pulse:  80 72 80  Resp:  18  16  Temp:  (!) 97.5 F (36.4 C) 97.6 F (36.4 C) 98  F (36.7 C)  TempSrc:  Oral Oral Oral  SpO2: 92% 94% 92% 98%  Weight:   118.9 kg   Height:        Wt Readings from Last 3 Encounters:  07/30/19 118.9 kg  09/24/11 125.2 kg     Intake/Output Summary (Last 24 hours) at 07/30/2019 0930 Last data filed at 07/30/2019 0519 Gross per 24 hour  Intake 960 ml  Output 4100 ml  Net -3140 ml     Physical Exam  Awake Alert, No new F.N deficits, Normal affect [place].AT,PERRAL Supple Neck,No JVD, No cervical lymphadenopathy appriciated.  Symmetrical Chest wall movement, Good air movement bilaterally, CTAB RRR,No Gallops, Rubs, positive aortic systolic murmur, No Parasternal Heave +ve B.Sounds, Abd Soft, No tenderness, No organomegaly appriciated, No rebound - guarding or rigidity. No Cyanosis,  2+ leg edema L>R,     Data Review:    CBC Recent Labs  Lab 07/24/19 1237 07/24/19 1237 07/25/19 0858 07/25/19 0858 07/26/19 0350 07/26/19 0350 07/27/19 0537 07/27/19 0537 07/28/19 0431 07/28/19 1255 07/28/19 1257 07/29/19 0434 07/30/19 0250  WBC 7.9   < > 6.9   < > 7.9  --  8.2  --  8.3  --   --  7.4 8.3  HGB 14.6   < > 13.7   < > 13.6   < > 13.8   < > 13.5 15.0 15.3 13.1 13.5  HCT  45.8   < > 42.9   < > 43.0   < > 43.5   < > 42.7 44.0 45.0 41.7 42.1  PLT 215   < > 193   < > 197  --  195  --  204  --   --  206 205  MCV 92.9   < > 93.9   < > 93.5  --  93.5  --  93.6  --   --  95.4 93.8  MCH 29.6   < > 30.0   < > 29.6  --  29.7  --  29.6  --   --  30.0 30.1  MCHC 31.9   < > 31.9   < > 31.6  --  31.7  --  31.6  --   --  31.4 32.1  RDW 16.5*   < > 16.5*   < > 16.8*  --  16.8*  --  16.4*  --   --  17.0* 16.9*  LYMPHSABS 0.9  --  0.9  --  1.0  --  1.0  --  1.1  --   --   --   --   MONOABS 0.5  --  0.4  --  0.5  --  0.5  --  0.6  --   --   --   --   EOSABS 0.1  --  0.1  --  0.1  --  0.1  --  0.1  --   --   --   --   BASOSABS 0.0  --  0.0  --  0.0  --  0.0  --  0.1  --   --   --   --    < > = values in this interval not displayed.     Chemistries  Recent Labs  Lab 07/24/19 1237 07/24/19 1237 07/25/19 0858 07/25/19 0858 07/26/19 0350 07/26/19 0350 07/27/19 0537 07/27/19 0537 07/28/19 0431 07/28/19 1255 07/28/19 1257 07/29/19 0434 07/30/19 0250  NA 139   < > 138   < > 136   < > 139   < > 136 140 137 137 135  K 4.1   < > 4.2   < > 3.6   < > 4.1   < > 4.2 4.0 4.0 4.1 4.3  CL 93*   < > 93*   < > 94*  --  95*  --  94*  --   --  96* 95*  CO2 34*   < > 34*   < > 35*  --  33*  --  32  --   --  33* 31  GLUCOSE 116*   < > 150*   < > 119*  --  127*  --  119*  --   --  119* 128*  BUN 18   < > 16   < > 16  --  17  --  19  --   --  16 19  CREATININE 0.95   < > 1.00   < > 0.92  --  0.93  --  0.96  --   --  0.90 0.99  CALCIUM 9.1   < > 8.9   < > 8.7*  --  9.1  --  8.9  --   --  9.1 8.9  MG 2.0   < > 2.0  --  2.0  --  2.0  --  1.9  --   --   --  1.9  AST 36  --  35  --  36  --  35  --  35  --   --   --   --   ALT 22  --  19  --  18  --  19  --  20  --   --   --   --   ALKPHOS 284*  --  268*  --  253*  --  287*  --  273*  --   --   --   --   BILITOT 2.8*  --  2.6*  --  2.3*  --  2.9*  --  2.8*  --   --   --   --    < > = values in this interval not displayed.   ------------------------------------------------------------------------------------------------------------------ No results for input(s): CHOL, HDL, LDLCALC, TRIG, CHOLHDL, LDLDIRECT in the last 72 hours.  Lab Results  Component Value Date   HGBA1C 6.1 (H) 07/21/2019   ------------------------------------------------------------------------------------------------------------------ No results for input(s): TSH, T4TOTAL, T3FREE, THYROIDAB in the last 72 hours.  Invalid input(s): FREET3 ------------------------------------------------------------------------------------------------------------------ No results for input(s): VITAMINB12, FOLATE, FERRITIN, TIBC, IRON, RETICCTPCT in the last 72 hours.  Coagulation profile No results for input(s): INR, PROTIME in the  last 168 hours.  No results for input(s): DDIMER in the last 72 hours.  Cardiac Enzymes No results for input(s): CKMB, TROPONINI, MYOGLOBIN in the last 168 hours.  Invalid input(s): CK ------------------------------------------------------------------------------------------------------------------    Component Value Date/Time   BNP 1,142.6 (H) 07/21/2019 1316    Inpatient Medications  Scheduled Meds: . aspirin EC  81 mg Oral Daily  . docusate sodium  100 mg Oral BID  . enoxaparin (LOVENOX) injection  60 mg Subcutaneous Q24H  . feeding supplement (ENSURE ENLIVE)  237 mL Oral Q1500  . feeding supplement (PRO-STAT SUGAR FREE 64)  30 mL Oral Daily  . furosemide  40 mg Intravenous Q12H  . levothyroxine  25 mcg Oral Q0600  . multivitamin with minerals  1 tablet Oral Daily  . saccharomyces boulardii  250 mg Oral BID  . sodium chloride flush  10-40 mL Intracatheter Q12H  . spironolactone  25 mg Oral Daily   Continuous Infusions:  PRN Meds:.acetaminophen **OR** [DISCONTINUED] acetaminophen, bisacodyl, hydrALAZINE, HYDROcodone-acetaminophen, [DISCONTINUED] ondansetron **OR** ondansetron (ZOFRAN) IV, polyethylene glycol  Micro Results Recent Results (from the past 240 hour(s))  Culture, blood (routine x 2)     Status: None   Collection Time: 07/21/19 12:37 PM   Specimen: BLOOD RIGHT HAND  Result Value Ref Range Status   Specimen Description BLOOD RIGHT HAND  Final   Special Requests   Final    BOTTLES DRAWN AEROBIC ONLY Blood Culture results may not be optimal due to an inadequate volume of blood received in culture bottles   Culture   Final    NO GROWTH 5 DAYS Performed at [facility], [place] [address] [place], [place] [address]    Report Status 07/26/2019 FINAL  Final  SARS Coronavirus 2 by RT PCR (hospital order, performed in [facility] hospital lab) Nasopharyngeal Nasopharyngeal Swab     Status: Abnormal   Collection Time: 07/21/19 12:37 PM   Specimen: Nasopharyngeal  Swab  Result Value Ref Range Status   SARS Coronavirus 2 POSITIVE (A) NEGATIVE Final    Comment: RESULT CALLED TO, READ BACK BY AND VERIFIED WITH: Angelina Ok RN 16:45 07/21/19 ([email or web address]) (NOTE) SARS-CoV-2 target nucleic acids are DETECTED SARS-CoV-2 RNA is generally detectable in upper respiratory specimens  during  the acute phase of infection.  Positive results are indicative  of the presence of the identified virus, but do not rule out bacterial infection or co-infection with other pathogens not detected by the test.  Clinical correlation with patient history and  other diagnostic information is necessary to determine patient infection status.  The expected result is negative. Fact Sheet for Patients:   StrictlyIdeas.no  Fact Sheet for Healthcare Providers:   BankingDealers.co.za   This test is not yet approved or cleared by the [place] FDA and  has been authorized for detection and/or diagnosis of SARS-CoV-2 by FDA under an Emergency Use Authorization (EUA).  This EUA will remain in effect (meaning this  test can be used) for the duration of  the COVID-19 declaration under Section 564(b)(1) of the Act, 21 U.S.C. section 360-bbb-3(b)(1), unless the authorization is terminated or revoked sooner. Performed at [facility], [place] [address], [place], [place] [address]   Culture, blood (routine x 2)     Status: None   Collection Time: 07/21/19 12:38 PM   Specimen: BLOOD  Result Value Ref Range Status   Specimen Description BLOOD RIGHT ANTECUBITAL  Final   Special Requests   Final    BOTTLES DRAWN AEROBIC AND ANAEROBIC Blood Culture results may not be optimal due to an inadequate volume of blood received in culture bottles   Culture   Final    NO GROWTH 5 DAYS Performed at [facility], [place] [address], [place], [place] [address]    Report Status 07/26/2019 FINAL  Final    Radiology Reports DG Chest 2 View   Result Date: 07/21/2019 CLINICAL DATA:  Chest pain and shortness of breath EXAM: CHEST - 2 VIEW COMPARISON:  06/25/2018 FINDINGS: Congested appearance of vessels with small pleural effusions. Cardiomegaly that is chronic. Negative aortic contours. IMPRESSION: Cardiomegaly with vascular congestion and small pleural effusions. Electronically Signed   By: Monte Fantasia M.D.   On: 07/21/2019 08:42   CARDIAC CATHETERIZATION  Result Date: 07/28/2019  There is severe aortic valve stenosis.  1.  Patent coronary arteries with no obstructive CAD 2.  Severe calcification and restriction of the aortic valve leaflets with hemodynamic evidence of critical aortic stenosis.  Mean aortic gradient 57 mmHg, peak gradient 108 mmHg, calculated aortic valve area 0.6 cm 3.  Elevated right and left heart pressures consistent with congestive heart failure Recommendations: Continued heart team evaluation for treatment options of critical aortic stenosis.  Continue IV diuresis as tolerated.  We will ask for inpatient cardiac surgical evaluation to help determine treatment plan.   CT CORONARY MORPH W/CTA COR W/SCORE W/CA W/CM &/OR WO/CM  Addendum Date: 07/27/2019   ADDENDUM REPORT: 07/27/2019 12:22 ADDENDUM: Extracardiac findings are described separately under dictation for contemporaneously obtained CTA chest, abdomen and pelvis performed on 07/25/2019. Please see dictation for that examination for description of relevant extracardiac findings. Electronically Signed   By: Vinnie Langton M.D.   On: 07/27/2019 12:22   Result Date: 07/27/2019 CLINICAL DATA:  54 year old male with severe aortic stenosis being evaluated for a TAVR procedure. EXAM: Cardiac TAVR CT TECHNIQUE: The patient was scanned on a [employer]. A 120 kV retrospective scan was triggered in the descending thoracic aorta at 111 HU's. Gantry rotation speed was 250 msecs and collimation was .6 mm. No beta blockade or nitro were given. The 3D data set was  reconstructed in 5% intervals of the R-R cycle. Systolic and diastolic phases were analyzed on a dedicated  work station using MPR, MIP and VRT modes. The patient received 80 cc of contrast. FINDINGS: Aortic Valve: Trileaflet aortic valve with severely calcified and thickened leaflets and only mild calcifications extending into the LVOT. Aorta: Normal size, minimal plaque, no calcifications, no dissection. Sinotubular Junction: 30 x 29 mm Ascending Thoracic Aorta: 35 x 34 mm Aortic Arch: Not visualized. Descending Thoracic Aorta: 22 x 22 mm Sinus of Valsalva Measurements: Non-coronary: 34 mm Right -coronary: 31 mm Left -coronary: 35 mm Coronary Artery Height above Annulus: Left Main: 13.1 mm Right Coronary: 18.2 mm Virtual Basal Annulus Measurements: Maximum/Minimum Diameter: 34.0 x 25.7 mm Mean Diameter: 28.2 mm Perimeter: 94.1 mm Area: 626 mm2 Coronary Arteries: Calcium score is 0. Coronary Arteries:  Normal coronary origin.  Right dominance. RCA is a large dominant artery that gives rise to PDA and PLA. There is no plaque. Left main is a large artery that gives rise to LAD and LCX arteries. Left main has no plaque. LAD is a large vessel that has no plaque. LCX is a non-dominant artery that gives rise to one large OM1 branch. There is no plaque. Optimum Fluoroscopic Angle for Delivery: RAO 0 CRA 0 IMPRESSION: 1. Trileaflet aortic valve with severely calcified and thickened leaflets and only mild calcifications extending into the LVOT. Aortic valve calcium score 7968 consistent with severe aortic stenosis. Annular measurements suitable for delivery of a 29 mm Edwards-SAPIEN 3 valve. 2. Sufficient coronary to annulus distance. 3. Optimum Fluoroscopic Angle for Delivery:  RAO 0 CRA 0. 4. No thrombus in the left atrial appendage. 5. Coronary Arteries: Normal origin. Right dominance. Calcium score is 0. There are only minimal irregularities. 6. Dilated pulmonary artery measuring 35 suggestive of pulmonary hypertension.  Electronically Signed: By: Ena Dawley On: 07/26/2019 13:01   CT ANGIO CHEST AORTA W/CM & OR WO/CM  Result Date: 07/27/2019 CLINICAL DATA:  54 year old male with history of severe aortic stenosis. Preprocedural study prior to potential transcatheter aortic valve replacement (TAVR) procedure. EXAM: CT ANGIOGRAPHY CHEST, ABDOMEN AND PELVIS TECHNIQUE: Non-contrast CT of the chest was initially obtained. Multidetector CT imaging through the chest, abdomen and pelvis was performed using the standard protocol during bolus administration of intravenous contrast. Multiplanar reconstructed images and MIPs were obtained and reviewed to evaluate the vascular anatomy. CONTRAST:  158m OMNIPAQUE IOHEXOL 350 MG/ML SOLN COMPARISON:  No priors. FINDINGS: CTA CHEST FINDINGS Cardiovascular: Heart size is mildly enlarged. There is no significant pericardial fluid, thickening or pericardial calcification. Aortic atherosclerosis. No definite coronary artery calcifications. Severe thickening calcification of the aortic valve. Dilatation of the pulmonic trunk (3.6 cm in diameter). Mediastinum/Lymph Nodes: No pathologically enlarged mediastinal or hilar lymph nodes. Esophagus is unremarkable in appearance. No axillary lymphadenopathy. Lungs/Pleura: Patchy but diffuse ground-glass attenuation and mild interlobular septal thickening noted throughout the lungs bilaterally, suggesting a background of mild interstitial pulmonary edema. Moderate bilateral pleural effusions lying dependently with some associated passive subsegmental atelectasis in the lower lobes of the lungs bilaterally. No definite suspicious appearing pulmonary nodules or masses. Musculoskeletal/Soft Tissues: Old healed fracture of the mid sternum with mild posttraumatic deformity. There are no aggressive appearing lytic or blastic lesions noted in the visualized portions of the skeleton. CTA ABDOMEN AND PELVIS FINDINGS Hepatobiliary: Liver has a slightly shrunken  appearance and nodular contour, indicative of underlying cirrhosis. No discrete cystic or solid hepatic lesions. No intra or extrahepatic biliary ductal dilatation. Gallbladder is nearly completely decompressed, and otherwise unremarkable in appearance. Pancreas: No pancreatic mass. No pancreatic ductal dilatation. No pancreatic  or peripancreatic fluid collections or inflammatory changes. Spleen: Unremarkable. Adrenals/Urinary Tract: Left kidney and bilateral adrenal glands are normal in appearance. In the lower pole of the right kidney there is an exophytic low-attenuation nonenhancing lesion measuring 8 cm in diameter, compatible with a simple cyst. No hydroureteronephrosis. Amorphous mass-like area of enhancement along the posterior wall of the urinary bladder measuring approximately 6.4 x 2.0 x 2.5 cm (axial image 214 of series 6 and sagittal image 111 of series 9). Stomach/Bowel: Appearance of the stomach is normal. No pathologic dilatation of small bowel or colon. A few scattered colonic diverticulae are noted, without definite surrounding inflammatory changes to suggest an acute diverticulitis (study is limited by presence of small volume of ascites). Normal appendix. Vascular/Lymphatic: Aortic atherosclerosis with vascular findings and measurements pertinent to potential TAVR procedure, as detailed below. No aneurysm or dissection noted in the abdominal or pelvic vasculature. No lymphadenopathy noted in the abdomen or pelvis. Reproductive: Prostate gland and seminal vesicles are unremarkable in appearance. Other: Small volume of ascites.  No pneumoperitoneum. Musculoskeletal: There are no aggressive appearing lytic or blastic lesions noted in the visualized portions of the skeleton. Mild diffuse body wall edema. VASCULAR MEASUREMENTS PERTINENT TO TAVR: AORTA: Minimal Aortic Diameter-16 x 16 mm Severity of Aortic Calcification-mild RIGHT PELVIS: Right Common Iliac Artery - Minimal Diameter-9.0 x 11.1 mm  Tortuosity - mild Calcification-minimal Right External Iliac Artery - Minimal Diameter-9.5 x 9.1 mm Tortuosity - mild Calcification-none Right Common Femoral Artery - Minimal Diameter-6.4 x 8.6 mm Tortuosity - mild Calcification-none LEFT PELVIS: Left Common Iliac Artery - Minimal Diameter-11.6 x 9.9 mm Tortuosity - mild Calcification-minimal Left External Iliac Artery - Minimal Diameter-11.5 x 8.8 mm Tortuosity - mild Calcification-none Left Common Femoral Artery - Minimal Diameter-9.1 x 8.7 mm Tortuosity-mild Calcification-none Review of the MIP images confirms the above findings. IMPRESSION: 1. Vascular findings and measurements pertinent to potential TAVR procedure, as detailed above. 2. Severe thickening calcification of the aortic valve, compatible with reported clinical history of severe aortic stenosis. 3. Cardiomegaly with evidence of mild interstitial pulmonary edema in the lungs and moderate bilateral pleural effusions; imaging findings concerning for congestive heart failure. 4. Amorphous mass-like area of enhancement along the posterior wall of the urinary bladder measuring approximately 6.4 x 2.0 x 2.5 cm. Nonemergent Urologic consultation is strongly recommended in the near future to better evaluate this finding, as the possibility of an infiltrative bladder neoplasm is not excluded. 5. There is also dilatation of the pulmonic trunk (3.6 cm in diameter), concerning for pulmonary arterial hypertension. 6. Morphologic changes in the liver suggesting early cirrhosis. 7. Small volume of ascites. 8. Mild diffuse body wall edema. 9. Additional incidental findings, as above. These results will be called to the ordering clinician or representative by the Radiologist Assistant, and communication documented in the PACS or [employer]. Electronically Signed   By: Vinnie Langton M.D.   On: 07/27/2019 12:47   ECHOCARDIOGRAM COMPLETE  Result Date: 07/21/2019    ECHOCARDIOGRAM REPORT   Patient Name:    Steve Richards Date of Exam: 07/21/2019 Medical Rec #:  [id number]   Height:       72.0 in Accession #:    [id number]  Weight:       310.0 lb Date of Birth:  December 03, 1965   BSA:          2.567 m Patient Age:    21 years    BP:           102/73 mmHg Patient  Gender: M           HR:           73 bpm. Exam Location:  Inpatient Procedure: 2D Echo Indications:    CHF-Acute Systolic 784.69 / G29.52  History:        Patient has no prior history of Echocardiogram examinations.                 Risk Factors:Obesity. Pleural effusion on right.  Sonographer:    Vikki Ports Turrentine Referring Phys: [place]  1. Left ventricular ejection fraction, by estimation, is 40 to 45%. The left ventricle has mildly decreased function. The left ventricle demonstrates global hypokinesis. There is moderate left ventricular hypertrophy. Left ventricular diastolic parameters are consistent with Grade II diastolic dysfunction (pseudonormalization).  2. Right ventricular systolic function is mildly reduced. The right ventricular size is normal. There is moderately elevated pulmonary artery systolic pressure.  3. Left atrial size was moderately dilated.  4. The mitral valve is abnormal. Trivial mitral valve regurgitation.  5. Cannot exclude a bicuspid valve. Aortic valve regurgitation is mild. Critical aortic valve stenosis. Aortic valve area, by VTI measures 0.73 cm. Aortic valve mean gradient measures 96.2 mmHg. Peak gradient of 137 mmHg. Aortic valve Vmax measures 5.86 m/s.  6. The inferior vena cava is dilated in size with <50% respiratory variability, suggesting right atrial pressure of 15 mmHg.  7. Moderate pleural effusion in the right lateral region. FINDINGS  Left Ventricle: Left ventricular ejection fraction, by estimation, is 40 to 45%. The left ventricle has mildly decreased function. The left ventricle demonstrates global hypokinesis. The left ventricular internal cavity size was normal in size. There is  moderate left  ventricular hypertrophy. Left ventricular diastolic parameters are consistent with Grade II diastolic dysfunction (pseudonormalization). Indeterminate filling pressures. Right Ventricle: The right ventricular size is normal. No increase in right ventricular wall thickness. Right ventricular systolic function is mildly reduced. There is moderately elevated pulmonary artery systolic pressure. The tricuspid regurgitant velocity is 3.35 m/s, and with an assumed right atrial pressure of 15 mmHg, the estimated right ventricular systolic pressure is 84.1 mmHg. Left Atrium: Left atrial size was moderately dilated. Right Atrium: Right atrial size was normal in size. Pericardium: There is no evidence of pericardial effusion. Mitral Valve: The mitral valve is abnormal. Mild to moderate mitral annular calcification. Trivial mitral valve regurgitation. Tricuspid Valve: The tricuspid valve is grossly normal. Tricuspid valve regurgitation is mild. Aortic Valve: The aortic valve is tricuspid. . There is moderate thickening and severe calcifcation of the aortic valve. Aortic valve regurgitation is mild. Aortic regurgitation PHT measures 374 msec. Severe aortic stenosis is present. There is moderate thickening of the aortic valve. There is severe calcifcation of the aortic valve. Aortic valve mean gradient measures 96.2 mmHg. Aortic valve peak gradient measures 137.3 mmHg. Aortic valve area, by VTI measures 0.73 cm. Pulmonic Valve: The pulmonic valve was normal in structure. Pulmonic valve regurgitation is not visualized. Aorta: The aortic root and ascending aorta are structurally normal, with no evidence of dilitation. Venous: The inferior vena cava is dilated in size with less than 50% respiratory variability, suggesting right atrial pressure of 15 mmHg. IAS/Shunts: No atrial level shunt detected by color flow Doppler. Additional Comments: There is a moderate pleural effusion in the right lateral region.  LEFT VENTRICLE PLAX 2D  LVIDd:         5.50 cm  Diastology LVIDs:         4.40 cm  LV e' lateral:   9.23 cm/s LV PW:         1.40 cm  LV E/e' lateral: 10.2 LV IVS:        1.40 cm  LV e' medial:    7.05 cm/s LVOT diam:     2.50 cm  LV E/e' medial:  13.4 LV SV:         108 LV SV Index:   42 LVOT Area:     4.91 cm  RIGHT VENTRICLE RV S prime:     7.20 cm/s TAPSE (M-mode): 1.2 cm LEFT ATRIUM              Index       RIGHT ATRIUM           Index LA diam:        5.90 cm  2.30 cm/m  RA Area:     22.60 cm LA Vol (A2C):   121.0 ml 47.14 ml/m RA Volume:   68.80 ml  26.80 ml/m LA Vol (A4C):   95.5 ml  37.21 ml/m LA Biplane Vol: 110.0 ml 42.86 ml/m  AORTIC VALVE AV Area (Vmax):    0.82 cm AV Area (Vmean):   0.72 cm AV Area (VTI):     0.73 cm AV Vmax:           585.80 cm/s AV Vmean:          474.000 cm/s AV VTI:            1.480 m AV Peak Grad:      137.3 mmHg AV Mean Grad:      96.2 mmHg LVOT Vmax:         97.30 cm/s LVOT Vmean:        69.800 cm/s LVOT VTI:          0.220 m LVOT/AV VTI ratio: 0.15 AI PHT:            374 msec  AORTA Ao Root diam: 3.00 cm MITRAL VALVE               TRICUSPID VALVE MV Area (PHT): 5.54 cm    TR Peak grad:   44.9 mmHg MV Decel Time: 137 msec    TR Vmax:        335.00 cm/s MV E velocity: 94.30 cm/s MV A velocity: 46.30 cm/s  SHUNTS MV E/A ratio:  2.04        Systemic VTI:  0.22 m                            Systemic Diam: 2.50 cm Lyman Bishop MD Electronically signed by Lyman Bishop MD Signature Date/Time: 07/21/2019/3:42:23 PM    Final    VAS US CAROTID  Result Date: 07/27/2019 Carotid Arterial Duplex Study Indications:       Covid-19. Other Factors:     Critical Aortic Stenosis, CHF, Morbid Obesity. Limitations        Today's exam was limited due to the body habitus of the                    patient. Comparison Study:  No prior study on file for comparison Performing Technologist: Sharion Dove RVS  Examination Guidelines: A complete evaluation includes B-mode imaging, spectral Doppler, color Doppler, and  power Doppler as needed of all accessible portions of each vessel. Bilateral testing is considered an integral part of a complete examination. Limited examinations for  reoccurring indications may be performed as noted.  Right Carotid Findings: +----------+--------+--------+--------+------------------+------------------+           PSV cm/sEDV cm/sStenosisPlaque DescriptionComments           +----------+--------+--------+--------+------------------+------------------+ CCA Prox  41      7                                 intimal thickening +----------+--------+--------+--------+------------------+------------------+ CCA Distal51      12                                intimal thickening +----------+--------+--------+--------+------------------+------------------+ ICA Prox  42      12              heterogenous                         +----------+--------+--------+--------+------------------+------------------+ ICA Distal48      15                                                   +----------+--------+--------+--------+------------------+------------------+ ECA       62      10                                                   +----------+--------+--------+--------+------------------+------------------+ +----------+--------+-------+--------+-------------------+           PSV cm/sEDV cmsDescribeArm Pressure (mmHG) +----------+--------+-------+--------+-------------------+ [id number]                                         +----------+--------+-------+--------+-------------------+ +---------+--------+--+--------+-+ VertebralPSV cm/s28EDV cm/s9 +---------+--------+--+--------+-+  Left Carotid Findings: +----------+--------+--------+--------+------------------+------------------+           PSV cm/sEDV cm/sStenosisPlaque DescriptionComments           +----------+--------+--------+--------+------------------+------------------+ CCA Prox  73      12                                 intimal thickening +----------+--------+--------+--------+------------------+------------------+ CCA Distal43      9                                 intimal thickening +----------+--------+--------+--------+------------------+------------------+ ICA Prox  54      11                                                   +----------+--------+--------+--------+------------------+------------------+ ICA Distal67      19                                                   +----------+--------+--------+--------+------------------+------------------+ ECA  53      5                                                    +----------+--------+--------+--------+------------------+------------------+ +----------+--------+--------+--------+-------------------+           PSV cm/sEDV cm/sDescribeArm Pressure (mmHG) +----------+--------+--------+--------+-------------------+ Subclavian113                                         +----------+--------+--------+--------+-------------------+ +---------+--------+--+--------+--+ VertebralPSV cm/s75EDV cm/s20 +---------+--------+--+--------+--+   Summary: Right Carotid: The extracranial vessels were near-normal with only minimal wall                thickening or plaque. Left Carotid: The extracranial vessels were near-normal with only minimal wall               thickening or plaque. Vertebrals:  Bilateral vertebral arteries demonstrate antegrade flow. Subclavians: Normal flow hemodynamics were seen in bilateral subclavian              arteries. *See table(s) above for measurements and observations.  Electronically signed by Servando Snare MD on 07/27/2019 at 12:53:06 AM.    Final    VAS [place] LOWER EXTREMITY VENOUS (DVT)  Result Date: 07/23/2019  Lower Venous DVTStudy Indications: Edema, and elevated ddimer.  Limitations: Body habitus and poor ultrasound/tissue interface. Comparison Study: no prior Performing Technologist:  Abram Sander RVS  Examination Guidelines: A complete evaluation includes B-mode imaging, spectral Doppler, color Doppler, and power Doppler as needed of all accessible portions of each vessel. Bilateral testing is considered an integral part of a complete examination. Limited examinations for reoccurring indications may be performed as noted. The reflux portion of the exam is performed with the patient in reverse Trendelenburg.  +---------+---------------+---------+-----------+----------+--------------+ RIGHT    CompressibilityPhasicitySpontaneityPropertiesThrombus Aging +---------+---------------+---------+-----------+----------+--------------+ CFV      Full           Yes      Yes                                 +---------+---------------+---------+-----------+----------+--------------+ SFJ      Full                                                        +---------+---------------+---------+-----------+----------+--------------+ FV Prox  Full                                                        +---------+---------------+---------+-----------+----------+--------------+ FV Mid                  Yes      Yes                                 +---------+---------------+---------+-----------+----------+--------------+ FV Distal  Yes      Yes                                 +---------+---------------+---------+-----------+----------+--------------+ PFV      Full                                                        +---------+---------------+---------+-----------+----------+--------------+ POP      Full           Yes      Yes                                 +---------+---------------+---------+-----------+----------+--------------+ PTV      Full                                                        +---------+---------------+---------+-----------+----------+--------------+ PERO     Full                                                         +---------+---------------+---------+-----------+----------+--------------+   +---------+---------------+---------+-----------+----------+--------------+ LEFT     CompressibilityPhasicitySpontaneityPropertiesThrombus Aging +---------+---------------+---------+-----------+----------+--------------+ CFV      Full           Yes      Yes                                 +---------+---------------+---------+-----------+----------+--------------+ SFJ      Full                                                        +---------+---------------+---------+-----------+----------+--------------+ FV Prox  Full                                                        +---------+---------------+---------+-----------+----------+--------------+ FV Mid   Full                                                        +---------+---------------+---------+-----------+----------+--------------+ FV Distal               Yes      Yes                                 +---------+---------------+---------+-----------+----------+--------------+ PFV  Full                                                        +---------+---------------+---------+-----------+----------+--------------+ POP      Full           Yes      Yes                                 +---------+---------------+---------+-----------+----------+--------------+ PTV      Full                                                        +---------+---------------+---------+-----------+----------+--------------+ PERO                                                  Not visualized +---------+---------------+---------+-----------+----------+--------------+     Summary: BILATERAL: - No evidence of deep vein thrombosis seen in the lower extremities, bilaterally. -   *See table(s) above for measurements and observations. Electronically signed by Monica Martinez MD on 07/23/2019 at 5:31:24 PM.    Final    CT Angio Abd/Pel w/  and/or w/o  Result Date: 07/27/2019 CLINICAL DATA:  54 year old male with history of severe aortic stenosis. Preprocedural study prior to potential transcatheter aortic valve replacement (TAVR) procedure. EXAM: CT ANGIOGRAPHY CHEST, ABDOMEN AND PELVIS TECHNIQUE: Non-contrast CT of the chest was initially obtained. Multidetector CT imaging through the chest, abdomen and pelvis was performed using the standard protocol during bolus administration of intravenous contrast. Multiplanar reconstructed images and MIPs were obtained and reviewed to evaluate the vascular anatomy. CONTRAST:  120m OMNIPAQUE IOHEXOL 350 MG/ML SOLN COMPARISON:  No priors. FINDINGS: CTA CHEST FINDINGS Cardiovascular: Heart size is mildly enlarged. There is no significant pericardial fluid, thickening or pericardial calcification. Aortic atherosclerosis. No definite coronary artery calcifications. Severe thickening calcification of the aortic valve. Dilatation of the pulmonic trunk (3.6 cm in diameter). Mediastinum/Lymph Nodes: No pathologically enlarged mediastinal or hilar lymph nodes. Esophagus is unremarkable in appearance. No axillary lymphadenopathy. Lungs/Pleura: Patchy but diffuse ground-glass attenuation and mild interlobular septal thickening noted throughout the lungs bilaterally, suggesting a background of mild interstitial pulmonary edema. Moderate bilateral pleural effusions lying dependently with some associated passive subsegmental atelectasis in the lower lobes of the lungs bilaterally. No definite suspicious appearing pulmonary nodules or masses. Musculoskeletal/Soft Tissues: Old healed fracture of the mid sternum with mild posttraumatic deformity. There are no aggressive appearing lytic or blastic lesions noted in the visualized portions of the skeleton. CTA ABDOMEN AND PELVIS FINDINGS Hepatobiliary: Liver has a slightly shrunken appearance and nodular contour, indicative of underlying cirrhosis. No discrete cystic or solid  hepatic lesions. No intra or extrahepatic biliary ductal dilatation. Gallbladder is nearly completely decompressed, and otherwise unremarkable in appearance. Pancreas: No pancreatic mass. No pancreatic ductal dilatation. No pancreatic or peripancreatic fluid collections or inflammatory changes. Spleen: Unremarkable. Adrenals/Urinary Tract: Left kidney and bilateral adrenal glands are normal in appearance. In the lower pole of the right kidney there  is an exophytic low-attenuation nonenhancing lesion measuring 8 cm in diameter, compatible with a simple cyst. No hydroureteronephrosis. Amorphous mass-like area of enhancement along the posterior wall of the urinary bladder measuring approximately 6.4 x 2.0 x 2.5 cm (axial image 214 of series 6 and sagittal image 111 of series 9). Stomach/Bowel: Appearance of the stomach is normal. No pathologic dilatation of small bowel or colon. A few scattered colonic diverticulae are noted, without definite surrounding inflammatory changes to suggest an acute diverticulitis (study is limited by presence of small volume of ascites). Normal appendix. Vascular/Lymphatic: Aortic atherosclerosis with vascular findings and measurements pertinent to potential TAVR procedure, as detailed below. No aneurysm or dissection noted in the abdominal or pelvic vasculature. No lymphadenopathy noted in the abdomen or pelvis. Reproductive: Prostate gland and seminal vesicles are unremarkable in appearance. Other: Small volume of ascites.  No pneumoperitoneum. Musculoskeletal: There are no aggressive appearing lytic or blastic lesions noted in the visualized portions of the skeleton. Mild diffuse body wall edema. VASCULAR MEASUREMENTS PERTINENT TO TAVR: AORTA: Minimal Aortic Diameter-16 x 16 mm Severity of Aortic Calcification-mild RIGHT PELVIS: Right Common Iliac Artery - Minimal Diameter-9.0 x 11.1 mm Tortuosity - mild Calcification-minimal Right External Iliac Artery - Minimal Diameter-9.5 x 9.1 mm  Tortuosity - mild Calcification-none Right Common Femoral Artery - Minimal Diameter-6.4 x 8.6 mm Tortuosity - mild Calcification-none LEFT PELVIS: Left Common Iliac Artery - Minimal Diameter-11.6 x 9.9 mm Tortuosity - mild Calcification-minimal Left External Iliac Artery - Minimal Diameter-11.5 x 8.8 mm Tortuosity - mild Calcification-none Left Common Femoral Artery - Minimal Diameter-9.1 x 8.7 mm Tortuosity-mild Calcification-none Review of the MIP images confirms the above findings. IMPRESSION: 1. Vascular findings and measurements pertinent to potential TAVR procedure, as detailed above. 2. Severe thickening calcification of the aortic valve, compatible with reported clinical history of severe aortic stenosis. 3. Cardiomegaly with evidence of mild interstitial pulmonary edema in the lungs and moderate bilateral pleural effusions; imaging findings concerning for congestive heart failure. 4. Amorphous mass-like area of enhancement along the posterior wall of the urinary bladder measuring approximately 6.4 x 2.0 x 2.5 cm. Nonemergent Urologic consultation is strongly recommended in the near future to better evaluate this finding, as the possibility of an infiltrative bladder neoplasm is not excluded. 5. There is also dilatation of the pulmonic trunk (3.6 cm in diameter), concerning for pulmonary arterial hypertension. 6. Morphologic changes in the liver suggesting early cirrhosis. 7. Small volume of ascites. 8. Mild diffuse body wall edema. 9. Additional incidental findings, as above. These results will be called to the ordering clinician or representative by the Radiologist Assistant, and communication documented in the PACS or [employer]. Electronically Signed   By: Vinnie Langton M.D.   On: 07/27/2019 12:47    Lala Lund M.D on 07/30/2019 at 9:30 AM   [facility] -  Office  [phone]

## 2019-07-30 NOTE — Plan of Care (Signed)
  Problem: Clinical Measurements: Goal: Respiratory complications will improve Outcome: Progressing   

## 2019-07-31 DIAGNOSIS — I5042 Chronic combined systolic (congestive) and diastolic (congestive) heart failure: Secondary | ICD-10-CM

## 2019-07-31 DIAGNOSIS — I350 Nonrheumatic aortic (valve) stenosis: Secondary | ICD-10-CM

## 2019-07-31 LAB — BASIC METABOLIC PANEL
Anion gap: 9 (ref 5–15)
BUN: 18 mg/dL (ref 6–20)
CO2: 34 mmol/L — ABNORMAL HIGH (ref 22–32)
Calcium: 9.4 mg/dL (ref 8.9–10.3)
Chloride: 94 mmol/L — ABNORMAL LOW (ref 98–111)
Creatinine, Ser: 0.98 mg/dL (ref 0.61–1.24)
GFR calc Af Amer: >60 mL/min (ref >60)
GFR calc non Af Amer: >60 mL/min (ref >60)
Glucose, Bld: 129 mg/dL — ABNORMAL HIGH (ref 70–99)
Potassium: 4.3 mmol/L (ref 3.5–5.1)
Sodium: 137 mmol/L (ref 135–145)

## 2019-07-31 LAB — C-REACTIVE PROTEIN: CRP: 1.8 mg/dL — ABNORMAL HIGH (ref <1.0)

## 2019-07-31 LAB — CBC
HCT: 44.6 % (ref 39.0–52.0)
Hemoglobin: 14 g/dL (ref 13.0–17.0)
MCH: 30 pg (ref 26.0–34.0)
MCHC: 31.4 g/dL (ref 30.0–36.0)
MCV: 95.5 fL (ref 80.0–100.0)
Platelets: 236 10*3/uL (ref 150–400)
RBC: 4.67 MIL/uL (ref 4.22–5.81)
RDW: 17 % — ABNORMAL HIGH (ref 11.5–15.5)
WBC: 8.3 10*3/uL (ref 4.0–10.5)
nRBC: 0 % (ref 0.0–0.2)

## 2019-07-31 LAB — MAGNESIUM: Magnesium: 2.1 mg/dL (ref 1.7–2.4)

## 2019-07-31 LAB — PROCALCITONIN: Procalcitonin: <0.1 ng/mL

## 2019-07-31 MED ORDER — MIDODRINE HCL 5 MG PO TABS
5.0000 mg | ORAL_TABLET | Freq: Three times a day (TID) | ORAL | Status: DC
  Administered 2019-07-31 – 2019-08-05 (×17): 5 mg via ORAL
  Filled 2019-07-31 (×17): qty 1

## 2019-07-31 NOTE — Progress Notes (Signed)
Physical Therapy Treatment Patient Details Name: Steve Richards MRN: [id number] DOB: [birth year] Today's Date: 07/31/2019    History of Present Illness 53y.o. male admitted on 07/22/19 for SOB and LE edema.  Dx with acute systolic/diastolic CHF, volume overload, COVID (+), LE dopplers pending to r/o DVT due to elevated D dimer, liver cirrhosis, L LE cellulits .  Pt with significant PMH of  Pleural effusion, cirrhosis of the liver with ascites d/p thoracentesis.  Caridology consulted and also found severe aortic stenosis.  Bil LE dopplers negative for DVT.  Due to have open valve replacement the week of 5/29.      PT Comments    Pt is progressing well with gait and exercises.  We are trying to build his strength and endurance for his pending open valve replacement surgery (likely middle of next week).  He had questions about what to expect.  We reviewed "move in the tube" and I will bring a handout of sternal precautions next session.  He is hopeful for a good recovery and improved QOL.  Is is moving much better now that he has had 50lbs of fluid removed from his body, even able to get his own socks on without struggling which he has not been able to do for weeks now.  PT will continue to follow acutely for safe mobility progression and education.   Follow Up Recommendations  No PT follow up     Equipment Recommendations  None recommended by PT    Recommendations for Other Services   NA     Precautions / Restrictions Precautions Precautions: Fall Precaution Comments: weeping bil LE wounds left worse than R    Mobility  Bed Mobility               General bed mobility comments: Pt OOB in chair  Transfers Overall transfer level: Modified independent                  Ambulation/Gait Ambulation/Gait assistance: Supervision Gait Distance (Feet): 3000 Feet(supervision and 4-5 standing rest breaks due to mild DOE ) Assistive device: None Gait Pattern/deviations: Step-through  pattern;Staggering right;Staggering left;Wide base of support Gait velocity: decreased Gait velocity interpretation: 1.31 - 2.62 ft/sec, indicative of limited community ambulator General Gait Details: Mildly staggering gait pattern, but improving as he stays here longer and MDs are able to remove more fluid.           Balance Overall balance assessment: Needs assistance Sitting-balance support: Feet supported;No upper extremity supported Sitting balance-Leahy Scale: Good Sitting balance - Comments: Pt able to donn socks in seated figure 4 pattern today (has not been able to do this in weeks) with continued increased tightness in L leg compared to R leg.  He does still have more fluid on his left side.    Standing balance support: No upper extremity supported;Single extremity supported Standing balance-Leahy Scale: Good Standing balance comment: When attempting anything with one leg supported needs one hand supported on counter                            Cognition Arousal/Alertness: Awake/alert Behavior During Therapy: WFL for tasks assessed/performed Overall Cognitive Status: Within Functional Limits for tasks assessed                                        Exercises Other Exercises Other Exercises:  demonstrated isometric R shoulder exercises and brought red t band for rows due to continued R shoulder pain.  Informed him he would not be able to do these after his open valve replacement.     General Comments General comments (skin integrity, edema, etc.): Pt had many questions about his pending open chest (not TAVR) valve replacement.  We talked about expectations of mobility post op, cardiac rehab, IS use, "move in the tube" and average course of stay in the hospital after that type of surgery.        Pertinent Vitals/Pain Pain Assessment: Faces Faces Pain Scale: Hurts little more Pain Location: R UE pain (subscap) Pain Descriptors / Indicators:  Aching;Burning Pain Intervention(s): Limited activity within patient's tolerance;Monitored during session;Repositioned           PT Goals (current goals can now be found in the care plan section) Acute Rehab PT Goals Patient Stated Goal: to get some fluid off and get his leg wounds headed in the right direction Progress towards PT goals: Progressing toward goals    Frequency    Min 2X/week(decreased until after his heart surgery as he is doing well.)      PT Plan Current plan remains appropriate;Frequency needs to be updated       AM-PAC PT "6 Clicks" Mobility   Outcome Measure  Help needed turning from your back to your side while in a flat bed without using bedrails?: None Help needed moving from lying on your back to sitting on the side of a flat bed without using bedrails?: None Help needed moving to and from a bed to a chair (including a wheelchair)?: None Help needed standing up from a chair using your arms (e.g., wheelchair or bedside chair)?: None Help needed to walk in hospital room?: None Help needed climbing 3-5 steps with a railing? : A Little 6 Click Score: 23    End of Session   Activity Tolerance: Patient limited by fatigue Patient left: in chair;with call bell/phone within reach   PT Visit Diagnosis: Muscle weakness (generalized) (M62.81);Difficulty in walking, not elsewhere classified (R26.2);Pain Pain - Right/Left: Right Pain - part of body: Shoulder     Time: 5038-8828 PT Time Calculation (min) (ACUTE ONLY): 66 min  Charges:  $Gait Training: 23-37 mins $Therapeutic Exercise: 8-22 mins $Self Care/Home Management: 8-22                    Verdene Lennert, PT, DPT  Acute Rehabilitation [phone] pager #[phone] office     07/31/2019, 3:39 PM

## 2019-07-31 NOTE — Progress Notes (Signed)
PROGRESS NOTE                                                                                                                                                                                                             Patient Demographics:    Steve Richards, is a 54 y.o. male, DOB - August 30, 1965, ZSM:[id number]  Admit date - 07/21/2019   Admitting Physician Karmen Bongo, MD  Outpatient Primary MD for the patient is System, Pcp Not In  LOS - 10   Chief Complaint  Patient presents with  . Shortness of Breath  . Leg Swelling       Brief Narrative     Steve Richards is a 54 y.o. male with medical history significant of R pleural effusion in April 2021 presenting with SOB and LE edema.    Patient did not follow with PCP for last 6 years, patient with recent work-up as an outpatient and negative for CHF, volume overload with thoracentesis status post drain, as well evidence of liver cirrhosis, and recent diagnosis of left lower extremity cellulitis where he was started on Keflex, ED secondary to dyspnea, evidence of volume overload, as well he tested Covid positive on admission .  His work-up was significant for volume overload, with severe aortic stenosis, acute systolic/diastolic CHF, he has been seen by cardiology/structural cardiology team for his severe aortic stenosis, imaging was significant for questionable bladder mass, urology has been following as well.   Subjective:   Patient in bed, appears comfortable, denies any headache, no fever, no chest pain or pressure, no shortness of breath , no abdominal pain. No focal weakness.   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 he has not seen a physician for 6 to 7 years prior to admission, cardiology on board has been diuresed over 25 L since admission up to 07/31/2019, now blood pressure too low hold further diuresis until blood pressure stabilizes  and then use as needed diuretics, continue on a boot to assist in fluid mobilization.  Blood pressure too low to tolerate ACE, ARB or Entresto, beta-blocker overall better from pulmonary standpoint. Cardiothoracic surgery evaluating for severe AS, case discussed with cardiothoracic surgery on 07/31/2019.   Severe aortic stenosis - Cards following left heart cath on 07/28/2019 shows  no significant CAD, cardiothoracic surgery will evaluate him for surgical correction of his severe aortic stenosis and likely to proceed with the procedure soon.  Also evaluated by TAVR team.    COVID-19 infection - incidental finding no treatment.   D-dimers elevated at 6 on admission, it is contrary it is currently trending down, tinea with current DVT prophylaxis dose, his venous Doppler negative for DVT .  COVID-19 Labs  Recent Labs    07/29/19 1803 07/30/19 0250 07/31/19 0352  CRP 1.8* 1.6* 1.8*    Lab Results  Component Value Date   SARSCOV2NAA POSITIVE (A) 07/21/2019   [place] NEGATIVE 06/23/2019    Liver cirrhosis - Patient denies any history of alcohol abuse in the past, this is most likely related to cardiac cirrhosis versus NASH.  Negative hepatitis panel, PCP to monitor and arrange for outpatient GI follow-up, if blood pressure allows will add low-dose beta-blocker.   Left lower extremity cellulitis - has been treated with antibiotics this admission and will nowl monitor clinically.  Some discoloration is due to massive edema.   Questionable findings of bladder mass - Urology consult done, bedside cystoscopy on 07/29/2019 unremarkable, outpatient follow-up with urology post discharge.  Hyperglycemia  -A1c is 6.1, prediabetic PCP to monitor.  Morbid obesity - BMI 35 follow with PCP for weight loss.  Hypothyroidism -TSH elevated at 14, with low normal free T4 at 0.65, started on low-dose Synthroid, check TSH in 4 to 6 weeks.   Code Status : Full  Disposition Plan  :   Status is:  Inpatient  Remains inpatient appropriate because:IV treatments appropriate due to intensity of illness or inability to take PO   Dispo: The patient is from: Home              Anticipated d/c is to: Home              Anticipated d/c date is: 3 days              Patient currently is not medically stable to d/c.  Patient still with significant volume overload, requiring further IV diuresis.  Plan for cardiac cath today, and bedside cystoscopy tomorrow.   Consults  :  Cardiology,Urology, cardiothoracic surgery  Procedures  :   Heart catheterization on 07/28/2019.     1.  Patent coronary arteries with no obstructive CAD 2.  Severe calcification and restriction of the aortic valve leaflets with hemodynamic evidence of critical aortic stenosis.  Mean aortic gradient 57 mmHg, peak gradient 108 mmHg, calculated aortic valve area 0.6 cm 3.  Elevated right and left heart pressures consistent with congestive heart failure   Bedside cystoscopy on 07/29/2019.  Unremarkable.  Bilateral leg ultrasound.  No DVT.    CTA -  1. Vascular findings and measurements pertinent to potential TAVR procedure, as detailed above. 2. Severe thickening calcification of the aortic valve, compatible with reported clinical history of severe aortic stenosis. 3. Cardiomegaly with evidence of mild interstitial pulmonary edema in the lungs and moderate bilateral pleural effusions; imaging findings concerning for congestive heart failure. 4. Amorphous mass-like area of enhancement along the posterior wall of the urinary bladder measuring approximately 6.4 x 2.0 x 2.5 cm. Nonemergent Urologic consultation is strongly recommended in the near future to better evaluate this finding, as the possibility of an infiltrative bladder neoplasm is not excluded. 5. There is also dilatation of the pulmonic trunk (3.6 cm in diameter), concerning for pulmonary arterial hypertension. 6. Morphologic changes in the liver suggesting early  cirrhosis. 7.  Small volume of ascites. 8. Mild diffuse body wall edema.  TTE  1. Left ventricular ejection fraction, by estimation, is 40 to 45%. The left ventricle has mildly decreased function. The left ventricle demonstrates global hypokinesis. There is moderate left ventricular hypertrophy. Left ventricular diastolic parameters are consistent with Grade II diastolic dysfunction  (pseudonormalization).  2. Right ventricular systolic function is mildly reduced. The right ventricular size is normal. There is moderately elevated pulmonary artery  systolic pressure.  3. Left atrial size was moderately dilated.  4. The mitral valve is abnormal. Trivial mitral valve regurgitation.  5. Cannot exclude a bicuspid valve. Aortic valve regurgitation is mild. Critical aortic valve stenosis. Aortic valve area, by VTI measures 0.73  cm. Aortic valve mean gradient measures 96.2 mmHg. Peak gradient of 137 mmHg. Aortic valve Vmax measures 5.86 m/s.  6. The inferior vena cava is dilated in size with <50% respiratory variability, suggesting right atrial pressure of 15 mmHg.  7. Moderate pleural effusion in the right lateral region.     DVT Prophylaxis  :  [place] lovenox  Lab Results  Component Value Date   PLT 236 07/31/2019    Antibiotics  :   Anti-infectives (From admission, onward)   Start     Dose/Rate Route Frequency Ordered Stop   07/25/19 1530  doxycycline (VIBRA-TABS) tablet 100 mg  Status:  Discontinued     100 mg Oral Every 12 hours 07/25/19 1521 07/27/19 0809   07/22/19 1400  ceFAZolin (ANCEF) IVPB 2g/100 mL premix  Status:  Discontinued     2 g 200 mL/hr over 30 Minutes Intravenous Every 8 hours 07/22/19 1223 07/25/19 1521   07/22/19 0000  vancomycin (VANCOREADY) IVPB 1250 mg/250 mL  Status:  Discontinued     1,250 mg 166.7 mL/hr over 90 Minutes Intravenous Every 12 hours 07/21/19 1141 07/22/19 1223   07/21/19 1145  vancomycin (VANCOREADY) IVPB 2000 mg/400 mL     2,000 mg 200 mL/hr over 120  Minutes Intravenous  Once 07/21/19 1141 07/21/19 1509        Objective:   Vitals:   07/30/19 2022 07/30/19 2229 07/31/19 0552 07/31/19 0751  BP: (!) 87/71 96/70 93/73  96/74  Pulse: 79  76 74  Resp: 18  20 20   Temp: 98 F (36.7 C)  97.8 F (36.6 C) 97.7 F (36.5 C)  TempSrc: Oral  Oral Oral  SpO2: 97%  95% 96%  Weight:   118.7 kg   Height:        Wt Readings from Last 3 Encounters:  07/31/19 118.7 kg  09/24/11 125.2 kg     Intake/Output Summary (Last 24 hours) at 07/31/2019 1009 Last data filed at 07/31/2019 6948 Gross per 24 hour  Intake 1320 ml  Output 4500 ml  Net -3180 ml     Physical Exam  Awake Alert, No new F.N deficits, Normal affect [place].AT,PERRAL Supple Neck,No JVD, No cervical lymphadenopathy appriciated.  Symmetrical Chest wall movement, Good air movement bilaterally, CTAB RRR,No Gallops, positive aortic systolic murmur, No Parasternal Heave +ve B.Sounds, Abd Soft, No tenderness, No organomegaly appriciated, No rebound - guarding or rigidity  2+ leg edema L>R,     Data Review:    CBC Recent Labs  Lab 07/24/19 1237 07/24/19 1237 07/25/19 0858 07/25/19 0858 07/26/19 0350 07/26/19 0350 07/27/19 0537 07/27/19 0537 07/28/19 0431 07/28/19 0431 07/28/19 1255 07/28/19 1257 07/29/19 0434 07/30/19 0250 07/31/19 0352  WBC 7.9   < > 6.9   < > 7.9   < >  8.2  --  8.3  --   --   --  7.4 8.3 8.3  HGB 14.6   < > 13.7   < > 13.6   < > 13.8   < > 13.5   < > 15.0 15.3 13.1 13.5 14.0  HCT 45.8   < > 42.9   < > 43.0   < > 43.5   < > 42.7   < > 44.0 45.0 41.7 42.1 44.6  PLT 215   < > 193   < > 197   < > 195  --  204  --   --   --  206 205 236  MCV 92.9   < > 93.9   < > 93.5   < > 93.5  --  93.6  --   --   --  95.4 93.8 95.5  MCH 29.6   < > 30.0   < > 29.6   < > 29.7  --  29.6  --   --   --  30.0 30.1 30.0  MCHC 31.9   < > 31.9   < > 31.6   < > 31.7  --  31.6  --   --   --  31.4 32.1 31.4  RDW 16.5*   < > 16.5*   < > 16.8*   < > 16.8*  --  16.4*  --   --   --   17.0* 16.9* 17.0*  LYMPHSABS 0.9  --  0.9  --  1.0  --  1.0  --  1.1  --   --   --   --   --   --   MONOABS 0.5  --  0.4  --  0.5  --  0.5  --  0.6  --   --   --   --   --   --   EOSABS 0.1  --  0.1  --  0.1  --  0.1  --  0.1  --   --   --   --   --   --   BASOSABS 0.0  --  0.0  --  0.0  --  0.0  --  0.1  --   --   --   --   --   --    < > = values in this interval not displayed.    Chemistries  Recent Labs  Lab 07/24/19 1237 07/24/19 1237 07/25/19 0858 07/25/19 0858 07/26/19 0350 07/26/19 0350 07/27/19 0537 07/27/19 0537 07/28/19 0431 07/28/19 0431 07/28/19 1255 07/28/19 1257 07/29/19 0434 07/30/19 0250 07/31/19 0352  NA 139   < > 138   < > 136   < > 139   < > 136   < > 140 137 137 135 137  K 4.1   < > 4.2   < > 3.6   < > 4.1   < > 4.2   < > 4.0 4.0 4.1 4.3 4.3  CL 93*   < > 93*   < > 94*   < > 95*  --  94*  --   --   --  96* 95* 94*  CO2 34*   < > 34*   < > 35*   < > 33*  --  32  --   --   --  33* 31 34*  GLUCOSE 116*   < > 150*   < > 119*   < > 127*  --  119*  --   --   --  119* 128* 129*  BUN 18   < > 16   < > 16   < > 17  --  19  --   --   --  16 19 18   CREATININE 0.95   < > 1.00   < > 0.92   < > 0.93  --  0.96  --   --   --  0.90 0.99 0.98  CALCIUM 9.1   < > 8.9   < > 8.7*   < > 9.1  --  8.9  --   --   --  9.1 8.9 9.4  MG 2.0   < > 2.0   < > 2.0  --  2.0  --  1.9  --   --   --   --  1.9 2.1  AST 36  --  35  --  36  --  35  --  35  --   --   --   --   --   --   ALT 22  --  19  --  18  --  19  --  20  --   --   --   --   --   --   ALKPHOS 284*  --  268*  --  253*  --  287*  --  273*  --   --   --   --   --   --   BILITOT 2.8*  --  2.6*  --  2.3*  --  2.9*  --  2.8*  --   --   --   --   --   --    < > = values in this interval not displayed.   ------------------------------------------------------------------------------------------------------------------ No results for input(s): CHOL, HDL, LDLCALC, TRIG, CHOLHDL, LDLDIRECT in the last 72 hours.  Lab Results    Component Value Date   HGBA1C 6.1 (H) 07/21/2019   ------------------------------------------------------------------------------------------------------------------ No results for input(s): TSH, T4TOTAL, T3FREE, THYROIDAB in the last 72 hours.  Invalid input(s): FREET3 ------------------------------------------------------------------------------------------------------------------ No results for input(s): VITAMINB12, FOLATE, FERRITIN, TIBC, IRON, RETICCTPCT in the last 72 hours.  Coagulation profile No results for input(s): INR, PROTIME in the last 168 hours.  No results for input(s): DDIMER in the last 72 hours.  Cardiac Enzymes No results for input(s): CKMB, TROPONINI, MYOGLOBIN in the last 168 hours.  Invalid input(s): CK ------------------------------------------------------------------------------------------------------------------    Component Value Date/Time   BNP 1,142.6 (H) 07/21/2019 1316    Inpatient Medications  Scheduled Meds: . aspirin EC  81 mg Oral Daily  . docusate sodium  100 mg Oral BID  . enoxaparin (LOVENOX) injection  60 mg Subcutaneous Q24H  . feeding supplement (ENSURE ENLIVE)  237 mL Oral Q1500  . feeding supplement (PRO-STAT SUGAR FREE 64)  30 mL Oral Daily  . levothyroxine  25 mcg Oral Q0600  . midodrine  5 mg Oral TID WC  . multivitamin with minerals  1 tablet Oral Daily  . saccharomyces boulardii  250 mg Oral BID  . sodium chloride flush  10-40 mL Intracatheter Q12H   Continuous Infusions:  PRN Meds:.acetaminophen **OR** [DISCONTINUED] acetaminophen, bisacodyl, hydrALAZINE, HYDROcodone-acetaminophen, [DISCONTINUED] ondansetron **OR** ondansetron (ZOFRAN) IV, polyethylene glycol  Micro Results Recent Results (from the past 240 hour(s))  Culture, blood (routine x 2)     Status: None   Collection Time: 07/21/19 12:37 PM   Specimen: BLOOD RIGHT HAND  Result Value  Ref Range Status   Specimen Description BLOOD RIGHT HAND  Final   Special  Requests   Final    BOTTLES DRAWN AEROBIC ONLY Blood Culture results may not be optimal due to an inadequate volume of blood received in culture bottles   Culture   Final    NO GROWTH 5 DAYS Performed at [facility], [place] [address] [place], [place] [address]    Report Status 07/26/2019 FINAL  Final  SARS Coronavirus 2 by RT PCR (hospital order, performed in [facility] hospital lab) Nasopharyngeal Nasopharyngeal Swab     Status: Abnormal   Collection Time: 07/21/19 12:37 PM   Specimen: Nasopharyngeal Swab  Result Value Ref Range Status   SARS Coronavirus 2 POSITIVE (A) NEGATIVE Final    Comment: RESULT CALLED TO, READ BACK BY AND VERIFIED WITH: Angelina Ok RN 16:45 07/21/19 ([email or web address]) (NOTE) SARS-CoV-2 target nucleic acids are DETECTED SARS-CoV-2 RNA is generally detectable in upper respiratory specimens  during the acute phase of infection.  Positive results are indicative  of the presence of the identified virus, but do not rule out bacterial infection or co-infection with other pathogens not detected by the test.  Clinical correlation with patient history and  other diagnostic information is necessary to determine patient infection status.  The expected result is negative. Fact Sheet for Patients:   StrictlyIdeas.no  Fact Sheet for Healthcare Providers:   BankingDealers.co.za   This test is not yet approved or cleared by the [place] FDA and  has been authorized for detection and/or diagnosis of SARS-CoV-2 by FDA under an Emergency Use Authorization (EUA).  This EUA will remain in effect (meaning this  test can be used) for the duration of  the COVID-19 declaration under Section 564(b)(1) of the Act, 21 U.S.C. section 360-bbb-3(b)(1), unless the authorization is terminated or revoked sooner. Performed at [facility], [place] [address], [place], [place] [address]   Culture, blood (routine x 2)     Status: None     Collection Time: 07/21/19 12:38 PM   Specimen: BLOOD  Result Value Ref Range Status   Specimen Description BLOOD RIGHT ANTECUBITAL  Final   Special Requests   Final    BOTTLES DRAWN AEROBIC AND ANAEROBIC Blood Culture results may not be optimal due to an inadequate volume of blood received in culture bottles   Culture   Final    NO GROWTH 5 DAYS Performed at [facility], [place] [address] [place],  [address]    Report Status 07/26/2019 FINAL  Final    Radiology Reports DG Chest 2 View  Result Date: 07/21/2019 CLINICAL DATA:  Chest pain and shortness of breath EXAM: CHEST - 2 VIEW COMPARISON:  06/25/2018 FINDINGS: Congested appearance of vessels with small pleural effusions. Cardiomegaly that is chronic. Negative aortic contours. IMPRESSION: Cardiomegaly with vascular congestion and small pleural effusions. Electronically Signed   By: Monte Fantasia M.D.   On: 07/21/2019 08:42   CARDIAC CATHETERIZATION  Result Date: 07/28/2019  There is severe aortic valve stenosis.  1.  Patent coronary arteries with no obstructive CAD 2.  Severe calcification and restriction of the aortic valve leaflets with hemodynamic evidence of critical aortic stenosis.  Mean aortic gradient 57 mmHg, peak gradient 108 mmHg, calculated aortic valve area 0.6 cm 3.  Elevated right and left heart pressures consistent with congestive heart failure Recommendations: Continued heart team evaluation for treatment options of critical aortic stenosis.  Continue IV diuresis as tolerated.  We will ask for inpatient cardiac surgical evaluation to help determine treatment plan.   CT CORONARY MORPH W/CTA COR W/SCORE W/CA W/CM &/OR WO/CM  Addendum Date: 07/27/2019   ADDENDUM REPORT: 07/27/2019 12:22 ADDENDUM: Extracardiac findings are described separately under dictation for contemporaneously obtained CTA chest, abdomen and pelvis performed on 07/25/2019. Please see dictation for that examination for description of  relevant extracardiac findings. Electronically Signed   By: Vinnie Langton M.D.   On: 07/27/2019 12:22   Result Date: 07/27/2019 CLINICAL DATA:  54 year old male with severe aortic stenosis being evaluated for a TAVR procedure. EXAM: Cardiac TAVR CT TECHNIQUE: The patient was scanned on a [employer]. A 120 kV retrospective scan was triggered in the descending thoracic aorta at 111 HU's. Gantry rotation speed was 250 msecs and collimation was .6 mm. No beta blockade or nitro were given. The 3D data set was reconstructed in 5% intervals of the R-R cycle. Systolic and diastolic phases were analyzed on a dedicated work station using MPR, MIP and VRT modes. The patient received 80 cc of contrast. FINDINGS: Aortic Valve: Trileaflet aortic valve with severely calcified and thickened leaflets and only mild calcifications extending into the LVOT. Aorta: Normal size, minimal plaque, no calcifications, no dissection. Sinotubular Junction: 30 x 29 mm Ascending Thoracic Aorta: 35 x 34 mm Aortic Arch: Not visualized. Descending Thoracic Aorta: 22 x 22 mm Sinus of Valsalva Measurements: Non-coronary: 34 mm Right -coronary: 31 mm Left -coronary: 35 mm Coronary Artery Height above Annulus: Left Main: 13.1 mm Right Coronary: 18.2 mm Virtual Basal Annulus Measurements: Maximum/Minimum Diameter: 34.0 x 25.7 mm Mean Diameter: 28.2 mm Perimeter: 94.1 mm Area: 626 mm2 Coronary Arteries: Calcium score is 0. Coronary Arteries:  Normal coronary origin.  Right dominance. RCA is a large dominant artery that gives rise to PDA and PLA. There is no plaque. Left main is a large artery that gives rise to LAD and LCX arteries. Left main has no plaque. LAD is a large vessel that has no plaque. LCX is a non-dominant artery that gives rise to one large OM1 branch. There is no plaque. Optimum Fluoroscopic Angle for Delivery: RAO 0 CRA 0 IMPRESSION: 1. Trileaflet aortic valve with severely calcified and thickened leaflets and only mild  calcifications extending into the LVOT. Aortic valve calcium score 7968 consistent with severe aortic stenosis. Annular measurements suitable for delivery of a 29 mm Edwards-SAPIEN 3 valve. 2. Sufficient coronary to annulus distance. 3. Optimum Fluoroscopic Angle for Delivery:  RAO 0 CRA 0. 4. No thrombus in the left atrial appendage. 5. Coronary Arteries: Normal origin. Right dominance. Calcium score is 0. There are only minimal irregularities. 6. Dilated pulmonary artery measuring 35 suggestive of pulmonary hypertension. Electronically Signed: By: Ena Dawley On: 07/26/2019 13:01   CT ANGIO CHEST AORTA W/CM & OR WO/CM  Result Date: 07/27/2019 CLINICAL DATA:  54 year old male with history of severe aortic stenosis. Preprocedural study prior to potential transcatheter aortic valve replacement (TAVR) procedure. EXAM: CT ANGIOGRAPHY CHEST, ABDOMEN AND PELVIS TECHNIQUE: Non-contrast CT of the chest was initially obtained. Multidetector CT imaging through the chest, abdomen and pelvis was performed using the standard protocol during bolus administration of intravenous contrast. Multiplanar reconstructed images and MIPs were obtained and reviewed to evaluate the vascular anatomy. CONTRAST:  155m OMNIPAQUE IOHEXOL 350 MG/ML SOLN COMPARISON:  No priors. FINDINGS: CTA CHEST FINDINGS Cardiovascular: Heart size is mildly enlarged. There is no significant pericardial fluid, thickening or pericardial calcification. Aortic atherosclerosis. No definite coronary artery calcifications.  Severe thickening calcification of the aortic valve. Dilatation of the pulmonic trunk (3.6 cm in diameter). Mediastinum/Lymph Nodes: No pathologically enlarged mediastinal or hilar lymph nodes. Esophagus is unremarkable in appearance. No axillary lymphadenopathy. Lungs/Pleura: Patchy but diffuse ground-glass attenuation and mild interlobular septal thickening noted throughout the lungs bilaterally, suggesting a background of mild  interstitial pulmonary edema. Moderate bilateral pleural effusions lying dependently with some associated passive subsegmental atelectasis in the lower lobes of the lungs bilaterally. No definite suspicious appearing pulmonary nodules or masses. Musculoskeletal/Soft Tissues: Old healed fracture of the mid sternum with mild posttraumatic deformity. There are no aggressive appearing lytic or blastic lesions noted in the visualized portions of the skeleton. CTA ABDOMEN AND PELVIS FINDINGS Hepatobiliary: Liver has a slightly shrunken appearance and nodular contour, indicative of underlying cirrhosis. No discrete cystic or solid hepatic lesions. No intra or extrahepatic biliary ductal dilatation. Gallbladder is nearly completely decompressed, and otherwise unremarkable in appearance. Pancreas: No pancreatic mass. No pancreatic ductal dilatation. No pancreatic or peripancreatic fluid collections or inflammatory changes. Spleen: Unremarkable. Adrenals/Urinary Tract: Left kidney and bilateral adrenal glands are normal in appearance. In the lower pole of the right kidney there is an exophytic low-attenuation nonenhancing lesion measuring 8 cm in diameter, compatible with a simple cyst. No hydroureteronephrosis. Amorphous mass-like area of enhancement along the posterior wall of the urinary bladder measuring approximately 6.4 x 2.0 x 2.5 cm (axial image 214 of series 6 and sagittal image 111 of series 9). Stomach/Bowel: Appearance of the stomach is normal. No pathologic dilatation of small bowel or colon. A few scattered colonic diverticulae are noted, without definite surrounding inflammatory changes to suggest an acute diverticulitis (study is limited by presence of small volume of ascites). Normal appendix. Vascular/Lymphatic: Aortic atherosclerosis with vascular findings and measurements pertinent to potential TAVR procedure, as detailed below. No aneurysm or dissection noted in the abdominal or pelvic vasculature. No  lymphadenopathy noted in the abdomen or pelvis. Reproductive: Prostate gland and seminal vesicles are unremarkable in appearance. Other: Small volume of ascites.  No pneumoperitoneum. Musculoskeletal: There are no aggressive appearing lytic or blastic lesions noted in the visualized portions of the skeleton. Mild diffuse body wall edema. VASCULAR MEASUREMENTS PERTINENT TO TAVR: AORTA: Minimal Aortic Diameter-16 x 16 mm Severity of Aortic Calcification-mild RIGHT PELVIS: Right Common Iliac Artery - Minimal Diameter-9.0 x 11.1 mm Tortuosity - mild Calcification-minimal Right External Iliac Artery - Minimal Diameter-9.5 x 9.1 mm Tortuosity - mild Calcification-none Right Common Femoral Artery - Minimal Diameter-6.4 x 8.6 mm Tortuosity - mild Calcification-none LEFT PELVIS: Left Common Iliac Artery - Minimal Diameter-11.6 x 9.9 mm Tortuosity - mild Calcification-minimal Left External Iliac Artery - Minimal Diameter-11.5 x 8.8 mm Tortuosity - mild Calcification-none Left Common Femoral Artery - Minimal Diameter-9.1 x 8.7 mm Tortuosity-mild Calcification-none Review of the MIP images confirms the above findings. IMPRESSION: 1. Vascular findings and measurements pertinent to potential TAVR procedure, as detailed above. 2. Severe thickening calcification of the aortic valve, compatible with reported clinical history of severe aortic stenosis. 3. Cardiomegaly with evidence of mild interstitial pulmonary edema in the lungs and moderate bilateral pleural effusions; imaging findings concerning for congestive heart failure. 4. Amorphous mass-like area of enhancement along the posterior wall of the urinary bladder measuring approximately 6.4 x 2.0 x 2.5 cm. Nonemergent Urologic consultation is strongly recommended in the near future to better evaluate this finding, as the possibility of an infiltrative bladder neoplasm is not excluded. 5. There is also dilatation of the pulmonic trunk (3.6 cm in diameter),  concerning for  pulmonary arterial hypertension. 6. Morphologic changes in the liver suggesting early cirrhosis. 7. Small volume of ascites. 8. Mild diffuse body wall edema. 9. Additional incidental findings, as above. These results will be called to the ordering clinician or representative by the Radiologist Assistant, and communication documented in the PACS or [employer]. Electronically Signed   By: Vinnie Langton M.D.   On: 07/27/2019 12:47   ECHOCARDIOGRAM COMPLETE  Result Date: 07/21/2019    ECHOCARDIOGRAM REPORT   Patient Name:   WILKIN LIPPY Date of Exam: 07/21/2019 Medical Rec #:  [id number]   Height:       72.0 in Accession #:    [id number]  Weight:       310.0 lb Date of Birth:  [birth year]   BSA:          2.567 m Patient Age:    42 years    BP:           102/73 mmHg Patient Gender: M           HR:           73 bpm. Exam Location:  Inpatient Procedure: 2D Echo Indications:    CHF-Acute Systolic 829.56 / O13.08  History:        Patient has no prior history of Echocardiogram examinations.                 Risk Factors:Obesity. Pleural effusion on right.  Sonographer:    Vikki Ports Turrentine Referring Phys: [place]  1. Left ventricular ejection fraction, by estimation, is 40 to 45%. The left ventricle has mildly decreased function. The left ventricle demonstrates global hypokinesis. There is moderate left ventricular hypertrophy. Left ventricular diastolic parameters are consistent with Grade II diastolic dysfunction (pseudonormalization).  2. Right ventricular systolic function is mildly reduced. The right ventricular size is normal. There is moderately elevated pulmonary artery systolic pressure.  3. Left atrial size was moderately dilated.  4. The mitral valve is abnormal. Trivial mitral valve regurgitation.  5. Cannot exclude a bicuspid valve. Aortic valve regurgitation is mild. Critical aortic valve stenosis. Aortic valve area, by VTI measures 0.73 cm. Aortic valve mean gradient measures  96.2 mmHg. Peak gradient of 137 mmHg. Aortic valve Vmax measures 5.86 m/s.  6. The inferior vena cava is dilated in size with <50% respiratory variability, suggesting right atrial pressure of 15 mmHg.  7. Moderate pleural effusion in the right lateral region. FINDINGS  Left Ventricle: Left ventricular ejection fraction, by estimation, is 40 to 45%. The left ventricle has mildly decreased function. The left ventricle demonstrates global hypokinesis. The left ventricular internal cavity size was normal in size. There is  moderate left ventricular hypertrophy. Left ventricular diastolic parameters are consistent with Grade II diastolic dysfunction (pseudonormalization). Indeterminate filling pressures. Right Ventricle: The right ventricular size is normal. No increase in right ventricular wall thickness. Right ventricular systolic function is mildly reduced. There is moderately elevated pulmonary artery systolic pressure. The tricuspid regurgitant velocity is 3.35 m/s, and with an assumed right atrial pressure of 15 mmHg, the estimated right ventricular systolic pressure is 65.7 mmHg. Left Atrium: Left atrial size was moderately dilated. Right Atrium: Right atrial size was normal in size. Pericardium: There is no evidence of pericardial effusion. Mitral Valve: The mitral valve is abnormal. Mild to moderate mitral annular calcification. Trivial mitral valve regurgitation. Tricuspid Valve: The tricuspid valve is grossly normal. Tricuspid valve regurgitation is mild. Aortic Valve: The aortic valve is tricuspid. Marland Kitchen  There is moderate thickening and severe calcifcation of the aortic valve. Aortic valve regurgitation is mild. Aortic regurgitation PHT measures 374 msec. Severe aortic stenosis is present. There is moderate thickening of the aortic valve. There is severe calcifcation of the aortic valve. Aortic valve mean gradient measures 96.2 mmHg. Aortic valve peak gradient measures 137.3 mmHg. Aortic valve area, by VTI  measures 0.73 cm. Pulmonic Valve: The pulmonic valve was normal in structure. Pulmonic valve regurgitation is not visualized. Aorta: The aortic root and ascending aorta are structurally normal, with no evidence of dilitation. Venous: The inferior vena cava is dilated in size with less than 50% respiratory variability, suggesting right atrial pressure of 15 mmHg. IAS/Shunts: No atrial level shunt detected by color flow Doppler. Additional Comments: There is a moderate pleural effusion in the right lateral region.  LEFT VENTRICLE PLAX 2D LVIDd:         5.50 cm  Diastology LVIDs:         4.40 cm  LV e' lateral:   9.23 cm/s LV PW:         1.40 cm  LV E/e' lateral: 10.2 LV IVS:        1.40 cm  LV e' medial:    7.05 cm/s LVOT diam:     2.50 cm  LV E/e' medial:  13.4 LV SV:         108 LV SV Index:   42 LVOT Area:     4.91 cm  RIGHT VENTRICLE RV S prime:     7.20 cm/s TAPSE (M-mode): 1.2 cm LEFT ATRIUM              Index       RIGHT ATRIUM           Index LA diam:        5.90 cm  2.30 cm/m  RA Area:     22.60 cm LA Vol (A2C):   121.0 ml 47.14 ml/m RA Volume:   68.80 ml  26.80 ml/m LA Vol (A4C):   95.5 ml  37.21 ml/m LA Biplane Vol: 110.0 ml 42.86 ml/m  AORTIC VALVE AV Area (Vmax):    0.82 cm AV Area (Vmean):   0.72 cm AV Area (VTI):     0.73 cm AV Vmax:           585.80 cm/s AV Vmean:          474.000 cm/s AV VTI:            1.480 m AV Peak Grad:      137.3 mmHg AV Mean Grad:      96.2 mmHg LVOT Vmax:         97.30 cm/s LVOT Vmean:        69.800 cm/s LVOT VTI:          0.220 m LVOT/AV VTI ratio: 0.15 AI PHT:            374 msec  AORTA Ao Root diam: 3.00 cm MITRAL VALVE               TRICUSPID VALVE MV Area (PHT): 5.54 cm    TR Peak grad:   44.9 mmHg MV Decel Time: 137 msec    TR Vmax:        335.00 cm/s MV E velocity: 94.30 cm/s MV A velocity: 46.30 cm/s  SHUNTS MV E/A ratio:  2.04        Systemic VTI:  0.22 m  Systemic Diam: 2.50 cm Lyman Bishop MD Electronically signed by Lyman Bishop MD Signature Date/Time: 07/21/2019/3:42:23 PM    Final    VAS US CAROTID  Result Date: 07/27/2019 Carotid Arterial Duplex Study Indications:       Covid-19. Other Factors:     Critical Aortic Stenosis, CHF, Morbid Obesity. Limitations        Today's exam was limited due to the body habitus of the                    patient. Comparison Study:  No prior study on file for comparison Performing Technologist: Sharion Dove RVS  Examination Guidelines: A complete evaluation includes B-mode imaging, spectral Doppler, color Doppler, and power Doppler as needed of all accessible portions of each vessel. Bilateral testing is considered an integral part of a complete examination. Limited examinations for reoccurring indications may be performed as noted.  Right Carotid Findings: +----------+--------+--------+--------+------------------+------------------+           PSV cm/sEDV cm/sStenosisPlaque DescriptionComments           +----------+--------+--------+--------+------------------+------------------+ CCA Prox  41      7                                 intimal thickening +----------+--------+--------+--------+------------------+------------------+ CCA Distal51      12                                intimal thickening +----------+--------+--------+--------+------------------+------------------+ ICA Prox  42      12              heterogenous                         +----------+--------+--------+--------+------------------+------------------+ ICA Distal48      15                                                   +----------+--------+--------+--------+------------------+------------------+ ECA       62      10                                                   +----------+--------+--------+--------+------------------+------------------+ +----------+--------+-------+--------+-------------------+           PSV cm/sEDV cmsDescribeArm Pressure (mmHG)  +----------+--------+-------+--------+-------------------+ [id number]                                         +----------+--------+-------+--------+-------------------+ +---------+--------+--+--------+-+ VertebralPSV cm/s28EDV cm/s9 +---------+--------+--+--------+-+  Left Carotid Findings: +----------+--------+--------+--------+------------------+------------------+           PSV cm/sEDV cm/sStenosisPlaque DescriptionComments           +----------+--------+--------+--------+------------------+------------------+ CCA Prox  73      12                                intimal thickening +----------+--------+--------+--------+------------------+------------------+ CCA Distal43      9  intimal thickening +----------+--------+--------+--------+------------------+------------------+ ICA Prox  54      11                                                   +----------+--------+--------+--------+------------------+------------------+ ICA Distal67      19                                                   +----------+--------+--------+--------+------------------+------------------+ ECA       53      5                                                    +----------+--------+--------+--------+------------------+------------------+ +----------+--------+--------+--------+-------------------+           PSV cm/sEDV cm/sDescribeArm Pressure (mmHG) +----------+--------+--------+--------+-------------------+ Subclavian113                                         +----------+--------+--------+--------+-------------------+ +---------+--------+--+--------+--+ VertebralPSV cm/s75EDV cm/s20 +---------+--------+--+--------+--+   Summary: Right Carotid: The extracranial vessels were near-normal with only minimal wall                thickening or plaque. Left Carotid: The extracranial vessels were near-normal with only minimal wall                thickening or plaque. Vertebrals:  Bilateral vertebral arteries demonstrate antegrade flow. Subclavians: Normal flow hemodynamics were seen in bilateral subclavian              arteries. *See table(s) above for measurements and observations.  Electronically signed by Servando Snare MD on 07/27/2019 at 12:53:06 AM.    Final    VAS [place] LOWER EXTREMITY VENOUS (DVT)  Result Date: 07/23/2019  Lower Venous DVTStudy Indications: Edema, and elevated ddimer.  Limitations: Body habitus and poor ultrasound/tissue interface. Comparison Study: no prior Performing Technologist: Abram Sander RVS  Examination Guidelines: A complete evaluation includes B-mode imaging, spectral Doppler, color Doppler, and power Doppler as needed of all accessible portions of each vessel. Bilateral testing is considered an integral part of a complete examination. Limited examinations for reoccurring indications may be performed as noted. The reflux portion of the exam is performed with the patient in reverse Trendelenburg.  +---------+---------------+---------+-----------+----------+--------------+ RIGHT    CompressibilityPhasicitySpontaneityPropertiesThrombus Aging +---------+---------------+---------+-----------+----------+--------------+ CFV      Full           Yes      Yes                                 +---------+---------------+---------+-----------+----------+--------------+ SFJ      Full                                                        +---------+---------------+---------+-----------+----------+--------------+ FV Prox  Full                                                        +---------+---------------+---------+-----------+----------+--------------+  FV Mid                  Yes      Yes                                 +---------+---------------+---------+-----------+----------+--------------+ FV Distal               Yes      Yes                                  +---------+---------------+---------+-----------+----------+--------------+ PFV      Full                                                        +---------+---------------+---------+-----------+----------+--------------+ POP      Full           Yes      Yes                                 +---------+---------------+---------+-----------+----------+--------------+ PTV      Full                                                        +---------+---------------+---------+-----------+----------+--------------+ PERO     Full                                                        +---------+---------------+---------+-----------+----------+--------------+   +---------+---------------+---------+-----------+----------+--------------+ LEFT     CompressibilityPhasicitySpontaneityPropertiesThrombus Aging +---------+---------------+---------+-----------+----------+--------------+ CFV      Full           Yes      Yes                                 +---------+---------------+---------+-----------+----------+--------------+ SFJ      Full                                                        +---------+---------------+---------+-----------+----------+--------------+ FV Prox  Full                                                        +---------+---------------+---------+-----------+----------+--------------+ FV Mid   Full                                                        +---------+---------------+---------+-----------+----------+--------------+  FV Distal               Yes      Yes                                 +---------+---------------+---------+-----------+----------+--------------+ PFV      Full                                                        +---------+---------------+---------+-----------+----------+--------------+ POP      Full           Yes      Yes                                  +---------+---------------+---------+-----------+----------+--------------+ PTV      Full                                                        +---------+---------------+---------+-----------+----------+--------------+ PERO                                                  Not visualized +---------+---------------+---------+-----------+----------+--------------+     Summary: BILATERAL: - No evidence of deep vein thrombosis seen in the lower extremities, bilaterally. -   *See table(s) above for measurements and observations. Electronically signed by Monica Martinez MD on 07/23/2019 at 5:31:24 PM.    Final    CT Angio Abd/Pel w/ and/or w/o  Result Date: 07/27/2019 CLINICAL DATA:  54 year old male with history of severe aortic stenosis. Preprocedural study prior to potential transcatheter aortic valve replacement (TAVR) procedure. EXAM: CT ANGIOGRAPHY CHEST, ABDOMEN AND PELVIS TECHNIQUE: Non-contrast CT of the chest was initially obtained. Multidetector CT imaging through the chest, abdomen and pelvis was performed using the standard protocol during bolus administration of intravenous contrast. Multiplanar reconstructed images and MIPs were obtained and reviewed to evaluate the vascular anatomy. CONTRAST:  129m OMNIPAQUE IOHEXOL 350 MG/ML SOLN COMPARISON:  No priors. FINDINGS: CTA CHEST FINDINGS Cardiovascular: Heart size is mildly enlarged. There is no significant pericardial fluid, thickening or pericardial calcification. Aortic atherosclerosis. No definite coronary artery calcifications. Severe thickening calcification of the aortic valve. Dilatation of the pulmonic trunk (3.6 cm in diameter). Mediastinum/Lymph Nodes: No pathologically enlarged mediastinal or hilar lymph nodes. Esophagus is unremarkable in appearance. No axillary lymphadenopathy. Lungs/Pleura: Patchy but diffuse ground-glass attenuation and mild interlobular septal thickening noted throughout the lungs bilaterally, suggesting a  background of mild interstitial pulmonary edema. Moderate bilateral pleural effusions lying dependently with some associated passive subsegmental atelectasis in the lower lobes of the lungs bilaterally. No definite suspicious appearing pulmonary nodules or masses. Musculoskeletal/Soft Tissues: Old healed fracture of the mid sternum with mild posttraumatic deformity. There are no aggressive appearing lytic or blastic lesions noted in the visualized portions of the skeleton. CTA ABDOMEN AND PELVIS FINDINGS Hepatobiliary: Liver has a slightly shrunken appearance and nodular contour, indicative of underlying cirrhosis. No discrete  cystic or solid hepatic lesions. No intra or extrahepatic biliary ductal dilatation. Gallbladder is nearly completely decompressed, and otherwise unremarkable in appearance. Pancreas: No pancreatic mass. No pancreatic ductal dilatation. No pancreatic or peripancreatic fluid collections or inflammatory changes. Spleen: Unremarkable. Adrenals/Urinary Tract: Left kidney and bilateral adrenal glands are normal in appearance. In the lower pole of the right kidney there is an exophytic low-attenuation nonenhancing lesion measuring 8 cm in diameter, compatible with a simple cyst. No hydroureteronephrosis. Amorphous mass-like area of enhancement along the posterior wall of the urinary bladder measuring approximately 6.4 x 2.0 x 2.5 cm (axial image 214 of series 6 and sagittal image 111 of series 9). Stomach/Bowel: Appearance of the stomach is normal. No pathologic dilatation of small bowel or colon. A few scattered colonic diverticulae are noted, without definite surrounding inflammatory changes to suggest an acute diverticulitis (study is limited by presence of small volume of ascites). Normal appendix. Vascular/Lymphatic: Aortic atherosclerosis with vascular findings and measurements pertinent to potential TAVR procedure, as detailed below. No aneurysm or dissection noted in the abdominal or pelvic  vasculature. No lymphadenopathy noted in the abdomen or pelvis. Reproductive: Prostate gland and seminal vesicles are unremarkable in appearance. Other: Small volume of ascites.  No pneumoperitoneum. Musculoskeletal: There are no aggressive appearing lytic or blastic lesions noted in the visualized portions of the skeleton. Mild diffuse body wall edema. VASCULAR MEASUREMENTS PERTINENT TO TAVR: AORTA: Minimal Aortic Diameter-16 x 16 mm Severity of Aortic Calcification-mild RIGHT PELVIS: Right Common Iliac Artery - Minimal Diameter-9.0 x 11.1 mm Tortuosity - mild Calcification-minimal Right External Iliac Artery - Minimal Diameter-9.5 x 9.1 mm Tortuosity - mild Calcification-none Right Common Femoral Artery - Minimal Diameter-6.4 x 8.6 mm Tortuosity - mild Calcification-none LEFT PELVIS: Left Common Iliac Artery - Minimal Diameter-11.6 x 9.9 mm Tortuosity - mild Calcification-minimal Left External Iliac Artery - Minimal Diameter-11.5 x 8.8 mm Tortuosity - mild Calcification-none Left Common Femoral Artery - Minimal Diameter-9.1 x 8.7 mm Tortuosity-mild Calcification-none Review of the MIP images confirms the above findings. IMPRESSION: 1. Vascular findings and measurements pertinent to potential TAVR procedure, as detailed above. 2. Severe thickening calcification of the aortic valve, compatible with reported clinical history of severe aortic stenosis. 3. Cardiomegaly with evidence of mild interstitial pulmonary edema in the lungs and moderate bilateral pleural effusions; imaging findings concerning for congestive heart failure. 4. Amorphous mass-like area of enhancement along the posterior wall of the urinary bladder measuring approximately 6.4 x 2.0 x 2.5 cm. Nonemergent Urologic consultation is strongly recommended in the near future to better evaluate this finding, as the possibility of an infiltrative bladder neoplasm is not excluded. 5. There is also dilatation of the pulmonic trunk (3.6 cm in diameter),  concerning for pulmonary arterial hypertension. 6. Morphologic changes in the liver suggesting early cirrhosis. 7. Small volume of ascites. 8. Mild diffuse body wall edema. 9. Additional incidental findings, as above. These results will be called to the ordering clinician or representative by the Radiologist Assistant, and communication documented in the PACS or [employer]. Electronically Signed   By: Vinnie Langton M.D.   On: 07/27/2019 12:47    Lala Lund M.D on 07/31/2019 at 10:09 AM   [facility] -  Office  [phone]

## 2019-07-31 NOTE — Plan of Care (Signed)
  Problem: Clinical Measurements: Goal: Respiratory complications will improve Outcome: Progressing   

## 2019-07-31 NOTE — Progress Notes (Signed)
Nutrition Follow-up  DOCUMENTATION CODES:   Morbid obesity  INTERVENTION:  Continue Ensure Enlive poonce daily, each supplement provides 350 kcal and 20 grams of protein.  Continue30 ml Prostat poonce daily, each supplement provides 100 kcal and 15 grams of protein.  Encourage adequate PO intake.  NUTRITION DIAGNOSIS:   Increased nutrient needs related to chronic illness(cirrhosis) as evidenced by estimated needs; ongoing  GOAL:   Patient will meet greater than or equal to 90% of their needs; met  MONITOR:   PO intake, Supplement acceptance, Skin, Weight trends, Labs, I & O's  REASON FOR ASSESSMENT:   Consult Assessment of nutrition requirement/status  ASSESSMENT:   54 y.o. male with medical history significant of R pleural effusion in April 2021, cirrhosis with ascites s/p thoracentesis presenting with SOB and LE edema. COVID positive. Pt with acute systolic/diastolic CHF. S/P left heart cath on 5/25.  RD working remotely.   Meal completion has been 100%. Pt is currently on a hear diet with thin liquids. Pt currently has Ensure and Prostat ordered and has been consuming them. RD to continue with current orders to aid in caloric and protein needs.    Labs and medications reviewed.   Diet Order:   Diet Order            Diet Heart Room service appropriate? Yes; Fluid consistency: Thin  Diet effective now              EDUCATION NEEDS:   Not appropriate for education at this time  Skin:  Skin Assessment: Reviewed RN Assessment Skin Integrity Issues:: Other (Comment) Other: Left lower extremity cellulitis  Last BM:  5/28  Height:   Ht Readings from Last 1 Encounters:  07/21/19 6' (1.829 m)    Weight:   Wt Readings from Last 1 Encounters:  07/31/19 118.7 kg    BMI:  Body mass index is 35.49 kg/m.  Estimated Nutritional Needs:   Kcal:  2000-2200  Protein:  120-130 grams  Fluid:  1.5 L/day   Corrin Parker, MS, RD, LDN RD pager  number/after hours weekend pager number on Amion.

## 2019-07-31 NOTE — Consult Note (Signed)
[facility]  MULTIDISCIPLINARY HEART VALVE CLINIC  CARDIOTHORACIC SURGERY CONSULTATION REPORT  Referring Provider is No ref. provider found Primary Cardiologist is Candee Furbish, MD PCP is System, Pcp Not In  Chief Complaint  Patient presents with  . Shortness of Breath  . Leg Swelling    HPI:  The patient is a 54 year old gentleman who has been fairly healthy in the past but reports onset of slowly progressive lower extremity edema since around November 2020.  He reports that in April 2021 he felt like he pulled his groin and was concerned about a hernia and went to urgent care.  He was referred to general surgery and underwent a CT scan that showed a moderate pleural effusion and some ascites.  He had a thoracentesis done on 06/25/2019 removing 1.5 L of yellow serous fluid.  This was negative for malignancy.  He was placed on diuretics.  He was also having problems with a left lower extremity wound in addition to the swelling that developed after hitting his leg on a concrete block in April.  He was seen at an [facility] and started on antibiotics but his leg did not seem to be improving.  He continued to have progressive swelling in his legs and abdomen and developed shortness of breath prompted admission on 07/21/2019 for congestive heart failure.  He was noted to be markedly volume overloaded and was started on intravenous diuretics as well as antibiotics for the lower extremity cellulitis.  He had a Covid test that was positive although he had no specific symptoms.  A 2D echocardiogram was done on 07/21/2019 showing ejection fraction of 40 to 45% with global hypokinesis.  There is moderate LVH and grade 2 diastolic dysfunction.  Right ventricular systolic function was mildly reduced with moderately elevated pulmonary artery systolic pressure.  The aortic valve was heavily calcified and immobile with a mean gradient of 96 mmHg and a peak of 137 mmHg.  Peak velocity was 5.86  m/s.  There was trivial MR.  He was diuresed over 20 L.  Cardiac catheterization on 07/28/2019 showed no significant coronary disease.  The mean gradient across the aortic valve was measured at 57 mmHg with a peak gradient of 108 mmHg and a calculated valve area of 0.6 cm.  Right and left heart pressures were elevated with a PA pressure of 68/34, mean wedge pressure of 28, and a mean right atrial pressure of 21.  LVEDP was 30.  Cardiac index was 2.34.  He had a gated cardiac CTA that showed a trileaflet aortic valve with severe calcification and thickening.  Measurements were felt to be sufficient for a 29 mm transcatheter Sapien 3 valve.  The patient works as a Water engineer.  He was seeing his dentist regularly until the beginning of Covid and has not seen his dentist since then.  He has had no significant prior dental problems.  He has a non-smoker and denies alcohol abuse.  Past Medical History:  Diagnosis Date  . Chronic combined systolic (congestive) and diastolic (congestive) heart failure ([place])   . Cirrhosis of liver with ascites ([place])   . Critical aortic valve stenosis   . Morbid obesity ([place])   . Pleural effusion     Past Surgical History:  Procedure Laterality Date  . IR THORACENTESIS ASP PLEURAL SPACE W/IMG GUIDE  06/25/2019  . RIGHT/LEFT HEART CATH AND CORONARY ANGIOGRAPHY N/A 07/28/2019   Procedure: RIGHT/LEFT HEART CATH AND CORONARY ANGIOGRAPHY;  Surgeon: Sherren Mocha, MD;  Location: [facility];  Service: Cardiovascular;  Laterality: N/A;    Family History  Problem Relation Age of Onset  . Osteoporosis Mother   . Other Father        died in a hurricane    Social History   Socioeconomic History  . Marital status: Single    Spouse name: Not on file  . Number of children: Not on file  . Years of education: Not on file  . Highest education level: Not on file  Occupational History  . 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  Smoking status: Never Smoker  . Smokeless tobacco: Never Used  Substance and Sexual Activity  . Alcohol use: Yes    Comment: rarely  . Drug use: Never  . Sexual activity: Not on file  Other Topics Concern  . Not on file  Social History Narrative  . Not on file   Social Determinants of Health   Financial Resource Strain:   . Difficulty of Paying Living Expenses:   Food Insecurity:   . Worried About Charity fundraiser in the Last Year:   . Arboriculturist in the Last Year:   Transportation Needs:   . Film/video editor (Medical):   Marland Kitchen Lack of Transportation (Non-Medical):   Physical Activity:   . Days of Exercise per Week:   . Minutes of Exercise per Session:   Stress:   . Feeling of Stress :   Social Connections:   . Frequency of Communication with Friends and Family:   . Frequency of Social Gatherings with Friends and Family:   . Attends Religious Services:   . Active Member of Clubs or Organizations:   . Attends Archivist Meetings:   Marland Kitchen Marital Status:   Intimate Partner Violence:   . Fear of Current or Ex-Partner:   . Emotionally Abused:   Marland Kitchen Physically Abused:   . Sexually Abused:     Current Facility-Administered Medications  Medication Dose Route Frequency Provider Last Rate Last Admin  . acetaminophen (TYLENOL) tablet 650 mg  650 mg Oral Q6H PRN Karmen Bongo, MD      . aspirin EC tablet 81 mg  81 mg Oral Daily Karmen Bongo, MD   81 mg at 07/31/19 0826  . bisacodyl (DULCOLAX) EC tablet 5 mg  5 mg Oral Daily PRN Karmen Bongo, MD      . docusate sodium (COLACE) capsule 100 mg  100 mg Oral BID Karmen Bongo, MD   100 mg at 07/31/19 0826  . enoxaparin (LOVENOX) injection 60 mg  60 mg Subcutaneous Q24H Skeet Simmer, RPH   60 mg at 07/30/19 2156  . feeding supplement (ENSURE ENLIVE) (ENSURE ENLIVE) liquid 237 mL  237 mL Oral Q1500 Elgergawy, Silver Huguenin, MD   237 mL at 07/30/19 1505  . feeding supplement (PRO-STAT SUGAR FREE 64) liquid 30 mL  30  mL Oral Daily Elgergawy, Silver Huguenin, MD   30 mL at 07/31/19 0826  . hydrALAZINE (APRESOLINE) injection 5 mg  5 mg Intravenous Q4H PRN Karmen Bongo, MD      . HYDROcodone-acetaminophen (NORCO/VICODIN) 5-325 MG per tablet 1 tablet  1 tablet Oral Q6H PRN Thurnell Lose, MD      . levothyroxine (SYNTHROID) tablet 25 mcg  25 mcg Oral Q0600 Elgergawy, Silver Huguenin, MD   25 mcg at 07/31/19 [phone]  . midodrine (PROAMATINE) tablet 5 mg  5 mg Oral TID WC Lala Lund  K, MD   5 mg at 07/31/19 1123  . multivitamin with minerals tablet 1 tablet  1 tablet Oral Daily Elgergawy, Silver Huguenin, MD   1 tablet at 07/31/19 0825  . ondansetron (ZOFRAN) injection 4 mg  4 mg Intravenous Q6H PRN Karmen Bongo, MD      . polyethylene glycol (MIRALAX / GLYCOLAX) packet 17 g  17 g Oral Daily PRN Karmen Bongo, MD      . saccharomyces boulardii (FLORASTOR) capsule 250 mg  250 mg Oral BID Elgergawy, Silver Huguenin, MD   250 mg at 07/31/19 0826  . sodium chloride flush (NS) 0.9 % injection 10-40 mL  10-40 mL Intracatheter Q12H Elgergawy, Silver Huguenin, MD   10 mL at 07/31/19 0827    No Known Allergies    Review of Systems:   General:  normal appetite, + decreased energy, + weight gain, no weight loss, no fever  Cardiac:  no chest pain with exertion, no chest pain at rest, +SOB with exertion, no resting SOB, no PND, no orthopnea, no palpitations, no arrhythmia, no atrial fibrillation, + LE edema, no dizzy spells, no syncope  Respiratory:  + exertional shortness of breath, no home oxygen, no productive cough, no dry cough, no bronchitis, no wheezing, no hemoptysis, no asthma, no pain with inspiration or cough, no sleep apnea, no CPAP at night  GI:   no difficulty swallowing, no reflux, no frequent heartburn, no hiatal hernia, no abdominal pain, no constipation, no diarrhea, no hematochezia, no hematemesis, no melena  GU:   no dysuria,  no frequency, no urinary tract infection, no hematuria, no enlarged prostate, no kidney stones, no  kidney disease  Vascular:  no pain suggestive of claudication, no pain in feet, no leg cramps, no varicose veins, no DVT, + non-healing left ulcer  Neuro:   no stroke, no TIA's, no seizures, no headaches, no temporary blindness one eye,  no slurred speech, no peripheral neuropathy, no chronic pain, no instability of gait, no memory/cognitive dysfunction  Musculoskeletal: no arthritis, no joint swelling, no myalgias, no difficulty walking, no mobility   Skin:   no rash, no itching, + skin infections (cellulitis on admission), no pressure sores or ulcerations  Psych:   no anxiety, no depression, no nervousness, no unusual recent stress  Eyes:   no blurry vision, no floaters, no recent vision changes, does not wear glasses or contacts  ENT:   no hearing loss, no loose or painful teeth, no dentures, last saw dentist 1 year ago  Hematologic:  no easy bruising, no abnormal bleeding, no clotting disorder, no frequent epistaxis  Endocrine:  no diabetes, does not check CBG's at home       Physical Exam:   BP 96/74 (BP Location: Right Arm)   Pulse 74   Temp 97.7 F (36.5 C) (Oral)   Resp 20   Ht 6' (1.829 m)   Wt 118.7 kg   SpO2 96%   BMI 35.49 kg/m   General:  Obese,  well-appearing  HEENT:  Unremarkable, NCAT, PERLA, EOMI, teeth in fair condition  Neck:   no JVD, no bruits, no adenopathy   Chest:   clear to auscultation, symmetrical breath sounds, no wheezes, no rhonchi   CV:   RRR, grade lll/VI crescendo/decrescendo murmur heard best at RSB,  no diastolic murmur  Abdomen:  soft, obese, non-tender  Extremities:  warm, well-perfused, pulses not palpable due to edema, moderate to severe LE edema to upper thighs  Rectal/GU  Deferred  Neuro:  Grossly non-focal and symmetrical throughout  Skin:   Bilateral lower leg cellulitis with evidence of skin break down left lateral leg. No open draining wounds.   Diagnostic Tests:  ECHOCARDIOGRAM REPORT       Patient Name:  Steve Richards Date  of Exam: 07/21/2019  Medical Rec #: [id number]  Height:    72.0 in  Accession #:  [id number] Weight:    310.0 lb  Date of Birth: 1965/09/10  BSA:     2.567 m  Patient Age:  45 years  BP:      102/73 mmHg  Patient Gender: M      HR:      73 bpm.  Exam Location: Inpatient   Procedure: 2D Echo   Indications:  CHF-Acute Systolic 992.42 / A83.41    History:    Patient has no prior history of Echocardiogram  examinations.         Risk Factors:Obesity. Pleural effusion on right.    Sonographer:  Vikki Ports Turrentine  Referring Phys: [place]    1. Left ventricular ejection fraction, by estimation, is 40 to 45%. The  left ventricle has mildly decreased function. The left ventricle  demonstrates global hypokinesis. There is moderate left ventricular  hypertrophy. Left ventricular diastolic  parameters are consistent with Grade II diastolic dysfunction  (pseudonormalization).  2. Right ventricular systolic function is mildly reduced. The right  ventricular size is normal. There is moderately elevated pulmonary artery  systolic pressure.  3. Left atrial size was moderately dilated.  4. The mitral valve is abnormal. Trivial mitral valve regurgitation.  5. Cannot exclude a bicuspid valve. Aortic valve regurgitation is mild.  Critical aortic valve stenosis. Aortic valve area, by VTI measures 0.73  cm. Aortic valve mean gradient measures 96.2 mmHg. Peak gradient of 137  mmHg. Aortic valve Vmax measures  5.86 m/s.  6. The inferior vena cava is dilated in size with <50% respiratory  variability, suggesting right atrial pressure of 15 mmHg.  7. Moderate pleural effusion in the right lateral region.   FINDINGS  Left Ventricle: Left ventricular ejection fraction, by estimation, is 40  to 45%. The left ventricle has mildly decreased function. The left  ventricle demonstrates global hypokinesis. The left  ventricular internal  cavity size was normal in size. There is  moderate left ventricular hypertrophy. Left ventricular diastolic  parameters are consistent with Grade II diastolic dysfunction  (pseudonormalization). Indeterminate filling pressures.   Right Ventricle: The right ventricular size is normal. No increase in  right ventricular wall thickness. Right ventricular systolic function is  mildly reduced. There is moderately elevated pulmonary artery systolic  pressure. The tricuspid regurgitant  velocity is 3.35 m/s, and with an assumed right atrial pressure of 15  mmHg, the estimated right ventricular systolic pressure is 96.2 mmHg.   Left Atrium: Left atrial size was moderately dilated.   Right Atrium: Right atrial size was normal in size.   Pericardium: There is no evidence of pericardial effusion.   Mitral Valve: The mitral valve is abnormal. Mild to moderate mitral  annular calcification. Trivial mitral valve regurgitation.   Tricuspid Valve: The tricuspid valve is grossly normal. Tricuspid valve  regurgitation is mild.   Aortic Valve: The aortic valve is tricuspid. . There is moderate  thickening and severe calcifcation of the aortic valve. Aortic valve  regurgitation is mild. Aortic regurgitation PHT measures 374 msec. Severe  aortic stenosis is present. There is moderate  thickening of the aortic valve. There  is severe calcifcation of the aortic  valve. Aortic valve mean gradient measures 96.2 mmHg. Aortic valve peak  gradient measures 137.3 mmHg. Aortic valve area, by VTI measures 0.73 cm.   Pulmonic Valve: The pulmonic valve was normal in structure. Pulmonic valve  regurgitation is not visualized.   Aorta: The aortic root and ascending aorta are structurally normal, with  no evidence of dilitation.   Venous: The inferior vena cava is dilated in size with less than 50%  respiratory variability, suggesting right atrial pressure of 15 mmHg.   IAS/Shunts: No  atrial level shunt detected by color flow Doppler.   Additional Comments: There is a moderate pleural effusion in the right  lateral region.     LEFT VENTRICLE  PLAX 2D  LVIDd:     5.50 cm Diastology  LVIDs:     4.40 cm LV e' lateral:  9.23 cm/s  LV PW:     1.40 cm LV E/e' lateral: 10.2  LV IVS:    1.40 cm LV e' medial:  7.05 cm/s  LVOT diam:   2.50 cm LV E/e' medial: 13.4  LV SV:     108  LV SV Index:  42  LVOT Area:   4.91 cm     RIGHT VENTRICLE  RV S prime:   7.20 cm/s  TAPSE (M-mode): 1.2 cm   LEFT ATRIUM       Index    RIGHT ATRIUM      Index  LA diam:    5.90 cm 2.30 cm/m RA Area:   22.60 cm  LA Vol (A2C):  121.0 ml 47.14 ml/m RA Volume:  68.80 ml 26.80 ml/m  LA Vol (A4C):  95.5 ml 37.21 ml/m  LA Biplane Vol: 110.0 ml 42.86 ml/m  AORTIC VALVE  AV Area (Vmax):  0.82 cm  AV Area (Vmean):  0.72 cm  AV Area (VTI):   0.73 cm  AV Vmax:      585.80 cm/s  AV Vmean:     474.000 cm/s  AV VTI:      1.480 m  AV Peak Grad:   137.3 mmHg  AV Mean Grad:   96.2 mmHg  LVOT Vmax:     97.30 cm/s  LVOT Vmean:    69.800 cm/s  LVOT VTI:     0.220 m  LVOT/AV VTI ratio: 0.15  AI PHT:      374 msec    AORTA  Ao Root diam: 3.00 cm   MITRAL VALVE        TRICUSPID VALVE  MV Area (PHT): 5.54 cm  TR Peak grad:  44.9 mmHg  MV Decel Time: 137 msec  TR Vmax:    335.00 cm/s  MV E velocity: 94.30 cm/s  MV A velocity: 46.30 cm/s SHUNTS  MV E/A ratio: 2.04    Systemic VTI: 0.22 m               Systemic Diam: 2.50 cm   Lyman Bishop MD  Electronically signed by Lyman Bishop MD  Signature Date/Time: 07/21/2019/3:42:23 PM      Physicians    Panel Physicians  Referring Physician  Case Authorizing Physician   Sherren Mocha, MD (Primary)          Procedures   RIGHT/LEFT HEART CATH AND CORONARY ANGIOGRAPHY          Conclusion         .There is severe aortic valve stenosis.      1.  Patent coronary arteries with  no obstructive CAD  2.  Severe calcification and restriction of the aortic valve leaflets with hemodynamic evidence of critical aortic stenosis.  Mean aortic gradient 57 mmHg, peak gradient 108 mmHg, calculated aortic valve area 0.6 cm  3.  Elevated right and left heart pressures consistent with congestive heart failure     Recommendations: Continued heart team evaluation for treatment options of critical aortic stenosis.  Continue IV diuresis as tolerated.  We will ask for inpatient cardiac surgical evaluation to help determine treatment plan.                Indications   Severe aortic stenosis [I35.0 (ICD-10-CM)]        Procedural Details   Technical Details INDICATION: 54 year old male admitted with massive volume overload secondary to morbid obesity and congestive heart failure.  He is found to have critical aortic stenosis.  The patient has tested positive for COVID-19 and is currently hospitalized in the Covid unit receiving IV diuretic therapy.  He presents today for right and left heart catheterization in preparation for aortic valve replacement.  PROCEDURAL DETAILS: The right antecubital fossa is prepped, draped, and anesthetized with 1% lidocaine.  A tourniquet is placed over the right upper arm.  Using ultrasound guidance, the right basilic vein is accessed via a front wall puncture.  A 4/5 French slender sheath is inserted.  The right wrist was then Using trepped, draped, and anesthetized with on the right upper arm.   Using ultrasound guidance a 5/6 French Slender sheath was placed in the right radial artery.  Ultrasound images are stored in the patient's chart.  Intra-arterial verapamil was administered through the radial artery sheath. IV heparin was administered after a JR4 catheter was advanced into the central aorta. A Swan-Ganz catheter  was used for the right heart catheterization. Standard protocol was followed for recording of right heart pressures and sampling of oxygen saturations. Fick cardiac output was calculated. Standard Judkins catheters were used for selective coronary angiography. LV pressure is recorded and an aortic valve pullback is performed. There were no immediate procedural complications. The patient was transferred to the post catheterization recovery area for further monitoring.    Estimated blood loss <50 mL.   During this procedure medications were administered to achieve and maintain moderate conscious sedation while the patient's heart rate, blood pressure, and oxygen saturation were continuously monitored and I was present face-to-face 100% of this time.        Medications   (Filter: Administrations occurring from 07/28/19 1209 to 07/28/19 1514)      Continuous medications are totaled by the amount administered until 07/28/19 1514.      0.9 %  sodium chloride infusion (mL/hr) Total dose:  Cannot be calculated* Dosing weight:  123.7    *Continuous medication not stopped within the calculation time range.    Date/Time     Rate/Dose/Volume  Action  07/28/19 1226  10 mL/hr New Bag/Given        lidocaine (PF) (XYLOCAINE) 1 % injection (mL) Total volume:  4 mL      Date/Time     Rate/Dose/Volume  Action  07/28/19 1239  2 mL Given  1240  2 mL Given        midazolam (VERSED) injection (mg) Total dose:  2 mg      Date/Time     Rate/Dose/Volume  Action  07/28/19 1239  1 mg Given  1248  1 mg Given        fentaNYL (SUBLIMAZE) injection (  mcg) Total dose:  50 mcg      Date/Time     Rate/Dose/Volume  Action  07/28/19 1239  25 mcg Given  1248  25 mcg Given        Radial Cocktail/Verapamil only (mL) Total volume:  10 mL      Date/Time     Rate/Dose/Volume  Action  07/28/19 1241  10 mL Given        Heparin (Porcine) in  NaCl 1000-0.9 UT/500ML-% SOLN (mL) Total volume:  1,000 mL      Date/Time     Rate/Dose/Volume  Action  07/28/19 1249  500 mL Given  1249  500 mL Given        heparin sodium (porcine) injection (Units) Total dose:  6,000 Units      Date/Time     Rate/Dose/Volume  Action  07/28/19 1300  6,000 Units Given        iohexol (OMNIPAQUE) 350 MG/ML injection (mL) Total volume:  60 mL      Date/Time     Rate/Dose/Volume  Action  07/28/19 1309  60 mL Given        aspirin EC tablet 81 mg (mg) Total dose:  Cannot be calculated* Dosing weight:  140.6    *Administration dose not documented    Date/Time     Rate/Dose/Volume  Action  07/28/19 1209  *Not included in total MAR Hold        bisacodyl (DULCOLAX) EC tablet 5 mg (mg) Total dose:  Cannot be calculated* Dosing weight:  140.6    *Administration dose not documented    Date/Time     Rate/Dose/Volume  Action  07/28/19 1209  *Not included in total MAR Hold        docusate sodium (COLACE) capsule 100 mg (mg) Total dose:  Cannot be calculated* Dosing weight:  140.6    *Administration dose not documented    Date/Time     Rate/Dose/Volume  Action  07/28/19 1209  *Not included in total MAR Hold        feeding supplement (ENSURE ENLIVE) (ENSURE ENLIVE) liquid 237 mL (mL) Total dose:  Cannot be calculated* Dosing weight:  140.6    *Administration dose not documented    Date/Time     Rate/Dose/Volume  Action  07/28/19 1209  *Not included in total MAR Hold  1500  *Not included in total Automatically Held        feeding supplement (PRO-STAT SUGAR FREE 64) liquid 30 mL (mL) Total dose:  Cannot be calculated* Dosing weight:  140.6    *Administration dose not documented    Date/Time     Rate/Dose/Volume  Action  07/28/19 1209  *Not included in total MAR Hold        hydrALAZINE (APRESOLINE) injection 5 mg (mg) Total dose:  Cannot be  calculated* Dosing weight:  140.6    *Administration dose not documented    Date/Time     Rate/Dose/Volume  Action  07/28/19 1209  *Not included in total MAR Hold        levothyroxine (SYNTHROID) tablet 25 mcg (mcg) Total dose:  Cannot be calculated* Dosing weight:  140.6    *Administration dose not documented    Date/Time     Rate/Dose/Volume  Action  07/28/19 1209  *Not included in total MAR Hold        multivitamin with minerals tablet 1 tablet (tablet) Total dose:  Cannot be calculated* Dosing weight:  124.2    *Administration dose not documented  Date/Time     Rate/Dose/Volume  Action  07/28/19 1209  *Not included in total MAR Hold        polyethylene glycol (MIRALAX / GLYCOLAX) packet 17 g (g) Total dose:  Cannot be calculated* Dosing weight:  140.6    *Administration dose not documented    Date/Time     Rate/Dose/Volume  Action  07/28/19 1209  *Not included in total MAR Hold        saccharomyces boulardii (FLORASTOR) capsule 250 mg (mg) Total dose:  Cannot be calculated* Dosing weight:  125.8    *Administration dose not documented    Date/Time     Rate/Dose/Volume  Action  07/28/19 1209  *Not included in total MAR Hold        sodium chloride flush (NS) 0.9 % injection 10-40 mL (mL) Total dose:  Cannot be calculated* Dosing weight:  127.9    *Administration dose not documented    Date/Time     Rate/Dose/Volume  Action  07/28/19 1209  *Not included in total MAR Hold        0.9 %  sodium chloride infusion (mL) Total dose:  Cannot be calculated* Dosing weight:  140.6    *Administration dose not documented    Date/Time     Rate/Dose/Volume  Action  07/28/19 1209  *Not included in total MAR Hold        enoxaparin (LOVENOX) injection 65 mg (mg) Total dose:  Cannot be calculated* Dosing weight:  129.1    *Administration dose not  documented    Date/Time     Rate/Dose/Volume  Action  07/28/19 1209  *Not included in total MAR Hold  1345  *Not included in total Automatically Held        furosemide (LASIX) injection 40 mg (mg) Total dose:  Cannot be calculated* Dosing weight:  140.6    *Administration dose not documented    Date/Time     Rate/Dose/Volume  Action  07/28/19 1209  *Not included in total MAR Hold        HYDROcodone-acetaminophen (NORCO/VICODIN) 5-325 MG per tablet 1-2 tablet (tablet) Total dose:  Cannot be calculated* Dosing weight:  140.6    *Administration dose not documented    Date/Time     Rate/Dose/Volume  Action  07/28/19 1209  *Not included in total MAR Hold        sodium chloride flush (NS) 0.9 % injection 10-40 mL (mL) Total dose:  Cannot be calculated* Dosing weight:  127.9    *Administration dose not documented    Date/Time     Rate/Dose/Volume  Action  07/28/19 1209  *Not included in total MAR Hold        sodium chloride flush (NS) 0.9 % injection 3 mL (mL) Total dose:  Cannot be calculated* Dosing weight:  140.6    *Administration dose not documented    Date/Time     Rate/Dose/Volume  Action  07/28/19 1209  *Not included in total MAR Hold        sodium chloride flush (NS) 0.9 % injection 3 mL (mL) Total dose:  Cannot be calculated* Dosing weight:  140.6    *Administration dose not documented    Date/Time     Rate/Dose/Volume  Action  07/28/19 1209  *Not included in total MAR Hold          Sedation Time      Sedation Time Physician-1: 24 minutes 51 seconds           Contrast  Medication Name   Total Dose    iohexol (OMNIPAQUE) 350 MG/ML injection   60 mL           Radiation/Fluoro      Fluoro time: 3.8 (min)  DAP: 16109 (mGycm2)  Cumulative Air Kerma: 356 (mGy)           Coronary Findings     Diagnostic Dominance: Right   Left  Main  Vessel is angiographically normal.   Left Anterior Descending  Vessel is large. Vessel is angiographically normal. Large LAD, reaching the LV apex, with no obstructive disease.   Left Circumflex  Vessel is angiographically normal.   Right Coronary Artery  Vessel is very large. Vessel is angiographically normal. Very large, dominant vessel with no obstructive disease     Intervention   No interventions have been documented.                     Left Heart   Aortic Valve There is severe aortic valve stenosis. The aortic valve is calcified. There is restricted aortic valve motion.        Coronary Diagrams     Diagnostic Dominance: Right   Diagnostic Image    Intervention           Implants      No implant documentation for this case.         [employer] Images    Show images for CARDIAC CATHETERIZATION        Images on Long Term Storage    Show images for Corrion, Stirewalt to Procedure Log   Procedure Log             Hemo Data       Most Recent Value   Fick Cardiac Output 5.68 L/min  Fick Cardiac Output Index 2.34 (L/min)/BSA  Aortic Mean Gradient 56.76 mmHg  Aortic Peak Gradient 108 mmHg  Aortic Valve Area 0.63  Aortic Value Area Index 0.26 cm2/BSA  RA A Wave 24 mmHg  RA V Wave 24 mmHg  RA Mean 21 mmHg  RV Systolic Pressure 61 mmHg  RV Diastolic Pressure 7 mmHg  RV EDP 24 mmHg  PA Systolic Pressure 68 mmHg  PA Diastolic Pressure 34 mmHg  PA Mean 46 mmHg  PW A Wave 29 mmHg  PW V Wave 28 mmHg  PW Mean 28 mmHg  AO Systolic Pressure 95 mmHg  AO Diastolic Pressure 71 mmHg  AO Mean 82 mmHg  LV Systolic Pressure 604 mmHg  LV Diastolic Pressure 20 mmHg  LV EDP 30 mmHg  AOp Systolic Pressure 87 mmHg  AOp Diastolic Pressure 66 mmHg  AOp Mean Pressure 76 mmHg  LVp Systolic Pressure 540 mmHg  LVp Diastolic Pressure 28 mmHg  LVp EDP Pressure 40 mmHg  QP/QS 1  TPVR  Index 19.7 HRUI  TSVR Index 35.13 HRUI  PVR SVR Ratio 0.16  TPVR/TSVR Ratio 0.56    ADDENDUM REPORT: 07/27/2019 12:22  ADDENDUM: Extracardiac findings are described separately under dictation for contemporaneously obtained CTA chest, abdomen and pelvis performed on 07/25/2019. Please see dictation for that examination for description of relevant extracardiac findings.   Electronically Signed   By: Vinnie Langton M.D.   On: 07/27/2019 12:22   Addended by Etheleen Mayhew, MD on 07/27/2019 12:24 PM    Study Result  CLINICAL DATA:  54 year old male with severe aortic stenosis being evaluated for a TAVR procedure.  EXAM: Cardiac  TAVR CT  TECHNIQUE: The patient was scanned on a [employer]. A 120 kV retrospective scan was triggered in the descending thoracic aorta at 111 HU's. Gantry rotation speed was 250 msecs and collimation was .6 mm. No beta blockade or nitro were given. The 3D data set was reconstructed in 5% intervals of the R-R cycle. Systolic and diastolic phases were analyzed on a dedicated work station using MPR, MIP and VRT modes. The patient received 80 cc of contrast.  FINDINGS: Aortic Valve: Trileaflet aortic valve with severely calcified and thickened leaflets and only mild calcifications extending into the LVOT.  Aorta: Normal size, minimal plaque, no calcifications, no dissection.  Sinotubular Junction: 30 x 29 mm  Ascending Thoracic Aorta: 35 x 34 mm  Aortic Arch: Not visualized.  Descending Thoracic Aorta: 22 x 22 mm  Sinus of Valsalva Measurements:  Non-coronary: 34 mm  Right -coronary: 31 mm  Left -coronary: 35 mm  Coronary Artery Height above Annulus:  Left Main: 13.1 mm  Right Coronary: 18.2 mm  Virtual Basal Annulus Measurements:  Maximum/Minimum Diameter: 34.0 x 25.7 mm  Mean Diameter: 28.2 mm  Perimeter: 94.1 mm  Area: 626 mm2  Coronary Arteries: Calcium score is 0.  Coronary  Arteries:  Normal coronary origin.  Right dominance.  RCA is a large dominant artery that gives rise to PDA and PLA. There is no plaque.  Left main is a large artery that gives rise to LAD and LCX arteries. Left main has no plaque.  LAD is a large vessel that has no plaque.  LCX is a non-dominant artery that gives rise to one large OM1 branch. There is no plaque.  Optimum Fluoroscopic Angle for Delivery: RAO 0 CRA 0  IMPRESSION: 1. Trileaflet aortic valve with severely calcified and thickened leaflets and only mild calcifications extending into the LVOT. Aortic valve calcium score 7968 consistent with severe aortic stenosis. Annular measurements suitable for delivery of a 29 mm Edwards-SAPIEN 3 valve.  2. Sufficient coronary to annulus distance.  3. Optimum Fluoroscopic Angle for Delivery:  RAO 0 CRA 0.  4. No thrombus in the left atrial appendage.  5. Coronary Arteries: Normal origin. Right dominance. Calcium score is 0. There are only minimal irregularities.  6. Dilated pulmonary artery measuring 35 suggestive of pulmonary hypertension.  Electronically Signed: By: Ena Dawley On: 07/26/2019 13:01       Impression:  This 54 year old gentleman has stage D, critical, symptomatic aortic stenosis admitted with New [place] Heart Association class IV symptoms of shortness of breath at rest, orthopnea, massive lower extremity edema, and BNP of 1143 consistent with acute on chronic combined systolic and diastolic congestive heart failure.  He has improved significantly with massive diuresis.  I have personally reviewed his 2D echocardiogram, cardiac catheterization, and CTA studies.  His echocardiogram shows a heavily calcified immobile aortic valve with a mean gradient of 96 mmHg consistent with critical aortic stenosis.  Left ventricular and right ventricular systolic function are mildly decreased with grade 2 diastolic dysfunction.  His cardiac catheterization  shows no significant coronary disease.  There is moderate pulmonary hypertension early elevated right heart pressures.  His gated cardiac CTA shows a trileaflet aortic valve that is heavily calcified, thickened, and immobile.  I agree that aortic valve replacement is indicated in this patient.  Given his young age and relatively good medical condition overall I think that open surgical aortic valve replacement would be the best option for him.  I think transcatheter aortic valve replacement  would be associated with a significant risk of perivalvular leak as well as reduced long-term durability.  I discussed the alternatives of bioprosthetic and mechanical valves with the patient.  He said that he does not want to be on Coumadin and would rather have a bioprosthetic valve.  I think that is a reasonable option his age given our current generation of bioprosthetic valves with improved longevity.I discussed the operative procedure with the patient including alternatives, benefits and risks; including but not limited to bleeding, blood transfusion, infection, stroke, myocardial infarction, graft failure, heart block requiring a permanent pacemaker, organ dysfunction, and death.  Steve Richards understands and agrees to proceed.  I told him that I would not be able to do his surgery until next Thursday but I could check with one of my partners to see if they could do it on Tuesday.  He said that he would rather wait for me to do it on Thursday.  I think he should remain in the hospital until surgery to continue diuresis as tolerated, close observation, and antibiotics for his lower extremity cellulitis.  Plan:  He will be scheduled for open surgical aortic valve replacement using a bioprosthetic valve next Thursday, 08/06/2019.  I spent 60 minutes performing this consultation and > 50% of this time was spent face to face counseling and coordinating the care of this patient's critical aortic valve stenosis.   Gaye Pollack, MD 07/31/2019 11:36 AM

## 2019-07-31 NOTE — Progress Notes (Signed)
Orthopedic Tech Progress Note Patient Details:  Steve Richards Jun 09, 1965 [id number]  Ortho Devices Type of Ortho Device: Haematologist Ortho Device/Splint Location: BLE Ortho Device/Splint Interventions: Ordered, Application   Post Interventions Patient Tolerated: Well Instructions Provided: Care of [place] 07/31/2019, 3:54 PM

## 2019-08-01 LAB — CBC
HCT: 40.4 % (ref 39.0–52.0)
Hemoglobin: 12.8 g/dL — ABNORMAL LOW (ref 13.0–17.0)
MCH: 29.9 pg (ref 26.0–34.0)
MCHC: 31.7 g/dL (ref 30.0–36.0)
MCV: 94.4 fL (ref 80.0–100.0)
Platelets: 214 10*3/uL (ref 150–400)
RBC: 4.28 MIL/uL (ref 4.22–5.81)
RDW: 17 % — ABNORMAL HIGH (ref 11.5–15.5)
WBC: 7.7 10*3/uL (ref 4.0–10.5)
nRBC: 0 % (ref 0.0–0.2)

## 2019-08-01 LAB — MAGNESIUM: Magnesium: 2 mg/dL (ref 1.7–2.4)

## 2019-08-01 LAB — BASIC METABOLIC PANEL
Anion gap: 8 (ref 5–15)
BUN: 18 mg/dL (ref 6–20)
CO2: 31 mmol/L (ref 22–32)
Calcium: 8.9 mg/dL (ref 8.9–10.3)
Chloride: 97 mmol/L — ABNORMAL LOW (ref 98–111)
Creatinine, Ser: 0.99 mg/dL (ref 0.61–1.24)
GFR calc Af Amer: >60 mL/min (ref >60)
GFR calc non Af Amer: >60 mL/min (ref >60)
Glucose, Bld: 125 mg/dL — ABNORMAL HIGH (ref 70–99)
Potassium: 4.4 mmol/L (ref 3.5–5.1)
Sodium: 136 mmol/L (ref 135–145)

## 2019-08-01 LAB — C-REACTIVE PROTEIN: CRP: 1.6 mg/dL — ABNORMAL HIGH (ref <1.0)

## 2019-08-01 NOTE — Plan of Care (Signed)
  Problem: Clinical Measurements: Goal: Respiratory complications will improve Outcome: Progressing   

## 2019-08-01 NOTE — Progress Notes (Signed)
PROGRESS NOTE                                                                                                                                                                                                             Patient Demographics:    Steve Richards, is a 54 y.o. male, DOB - 1965-04-25, IPJ:[id number]  Admit date - 07/21/2019   Admitting Physician Steve Bongo, MD  Outpatient Primary MD for the patient is System, Pcp Not In  LOS - 76   Chief Complaint  Patient presents with  . Shortness of Breath  . Leg Swelling       Brief Narrative     Steve Richards is a 54 y.o. male with medical history significant of R pleural effusion in April 2021 presenting with SOB and LE edema.    Patient did not follow with PCP for last 6 years, patient with recent work-up as an outpatient and negative for CHF, volume overload with thoracentesis status post drain, as well evidence of liver cirrhosis, and recent diagnosis of left lower extremity cellulitis where he was started on Keflex, ED secondary to dyspnea, evidence of volume overload, as well he tested Covid positive on admission .  His work-up was significant for volume overload, with severe aortic stenosis, acute systolic/diastolic CHF, he has been seen by cardiology/structural cardiology team for his severe aortic stenosis, imaging was significant for questionable bladder mass, urology has been following as well.   Subjective:   Patient in bed, appears comfortable, denies any headache, no fever, no chest pain or pressure, no shortness of breath , no abdominal pain. No focal weakness.   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 he has not seen a physician for 6 to 7 years prior to admission, cardiology on board has been diuresed over 25 L since admission up to 07/31/2019, now blood pressure too low hold further diuresis until blood pressure stabilizes  and then use as needed diuretics, continue UNNA Boots to assist in fluid mobilization.  Blood pressure too low to tolerate ACE, ARB or Entresto, beta-blocker overall better from pulmonary standpoint. Cardiothoracic surgery evaluating for severe AS, case discussed with cardiothoracic surgery on 07/31/2019.   Severe aortic stenosis - Cards following left heart cath on 07/28/2019 shows no  significant CAD, cardiothoracic surgery will evaluate him for surgical correction of his severe aortic stenosis and likely to proceed with the procedure soon.  Also evaluated by TAVR team.  Avoid severe hypotension.   COVID-19 infection - incidental finding no treatment.   D-dimers elevated at 6 on admission, it is contrary it is currently trending down, tinea with current DVT prophylaxis dose, his venous Doppler negative for DVT .  COVID-19 Labs  Recent Labs    07/30/19 0250 07/31/19 0352 08/01/19 0428  CRP 1.6* 1.8* 1.6*    Lab Results  Component Value Date   SARSCOV2NAA POSITIVE (A) 07/21/2019   [place] NEGATIVE 06/23/2019    Liver cirrhosis - Patient denies any history of alcohol abuse in the past, this is most likely related to cardiac cirrhosis versus NASH.  Negative hepatitis panel, PCP to monitor and arrange for outpatient GI follow-up, if blood pressure allows will add low-dose beta-blocker.   Left lower extremity cellulitis - has been treated with antibiotics this admission and will nowl monitor clinically.  Some discoloration is due to massive edema.   Questionable findings of bladder mass - Urology consult done, bedside cystoscopy on 07/29/2019 unremarkable, outpatient follow-up with urology post discharge.  Hyperglycemia  -A1c is 6.1, prediabetic PCP to monitor.  Morbid obesity - BMI 35 follow with PCP for weight loss.  Hypothyroidism -TSH elevated at 14, with low normal free T4 at 0.65, started on low-dose Synthroid, check TSH in 4 to 6 weeks.   Code Status : Full  Disposition Plan   :   Status is: Inpatient  Remains inpatient appropriate because:IV treatments appropriate due to intensity of illness or inability to take PO   Dispo: The patient is from: Home              Anticipated d/c is to: Home              Anticipated d/c date is: 3 days              Patient currently is not medically stable to d/c.  Patient still with significant volume overload, requiring further IV diuresis.  Plan for cardiac cath today, and bedside cystoscopy tomorrow.   Consults  :  Cardiology,Urology, cardiothoracic surgery  Procedures  :   Heart catheterization on 07/28/2019.     1.  Patent coronary arteries with no obstructive CAD 2.  Severe calcification and restriction of the aortic valve leaflets with hemodynamic evidence of critical aortic stenosis.  Mean aortic gradient 57 mmHg, peak gradient 108 mmHg, calculated aortic valve area 0.6 cm 3.  Elevated right and left heart pressures consistent with congestive heart failure   Bedside cystoscopy on 07/29/2019.  Unremarkable.  Bilateral leg ultrasound.  No DVT.    CTA -  1. Vascular findings and measurements pertinent to potential TAVR procedure, as detailed above. 2. Severe thickening calcification of the aortic valve, compatible with reported clinical history of severe aortic stenosis. 3. Cardiomegaly with evidence of mild interstitial pulmonary edema in the lungs and moderate bilateral pleural effusions; imaging findings concerning for congestive heart failure. 4. Amorphous mass-like area of enhancement along the posterior wall of the urinary bladder measuring approximately 6.4 x 2.0 x 2.5 cm. Nonemergent Urologic consultation is strongly recommended in the near future to better evaluate this finding, as the possibility of an infiltrative bladder neoplasm is not excluded. 5. There is also dilatation of the pulmonic trunk (3.6 cm in diameter), concerning for pulmonary arterial hypertension. 6. Morphologic changes in the liver  suggesting  early cirrhosis. 7. Small volume of ascites. 8. Mild diffuse body wall edema.  TTE  1. Left ventricular ejection fraction, by estimation, is 40 to 45%. The left ventricle has mildly decreased function. The left ventricle demonstrates global hypokinesis. There is moderate left ventricular hypertrophy. Left ventricular diastolic parameters are consistent with Grade II diastolic dysfunction  (pseudonormalization).  2. Right ventricular systolic function is mildly reduced. The right ventricular size is normal. There is moderately elevated pulmonary artery  systolic pressure.  3. Left atrial size was moderately dilated.  4. The mitral valve is abnormal. Trivial mitral valve regurgitation.  5. Cannot exclude a bicuspid valve. Aortic valve regurgitation is mild. Critical aortic valve stenosis. Aortic valve area, by VTI measures 0.73  cm. Aortic valve mean gradient measures 96.2 mmHg. Peak gradient of 137 mmHg. Aortic valve Vmax measures 5.86 m/s.  6. The inferior vena cava is dilated in size with <50% respiratory variability, suggesting right atrial pressure of 15 mmHg.  7. Moderate pleural effusion in the right lateral region.     DVT Prophylaxis  :  [facility] lovenox  Lab Results  Component Value Date   PLT 214 08/01/2019    Antibiotics  :   Anti-infectives (From admission, onward)   Start     Dose/Rate Route Frequency Ordered Stop   07/25/19 1530  doxycycline (VIBRA-TABS) tablet 100 mg  Status:  Discontinued     100 mg Oral Every 12 hours 07/25/19 1521 07/27/19 0809   07/22/19 1400  ceFAZolin (ANCEF) IVPB 2g/100 mL premix  Status:  Discontinued     2 g 200 mL/hr over 30 Minutes Intravenous Every 8 hours 07/22/19 1223 07/25/19 1521   07/22/19 0000  vancomycin (VANCOREADY) IVPB 1250 mg/250 mL  Status:  Discontinued     1,250 mg 166.7 mL/hr over 90 Minutes Intravenous Every 12 hours 07/21/19 1141 07/22/19 1223   07/21/19 1145  vancomycin (VANCOREADY) IVPB 2000 mg/400 mL     2,000  mg 200 mL/hr over 120 Minutes Intravenous  Once 07/21/19 1141 07/21/19 1509        Objective:   Vitals:   07/31/19 2120 08/01/19 0500 08/01/19 0554 08/01/19 0811  BP: 94/72  98/71 91/65  Pulse: 82  78 80  Resp: 16  20 16   Temp: 97.7 F (36.5 C)  97.9 F (36.6 C) 97.7 F (36.5 C)  TempSrc: Oral  Oral Oral  SpO2: 93%  94% 92%  Weight:  118.4 kg    Height:        Wt Readings from Last 3 Encounters:  08/01/19 118.4 kg  09/24/11 125.2 kg     Intake/Output Summary (Last 24 hours) at 08/01/2019 1045 Last data filed at 08/01/2019 0815 Gross per 24 hour  Intake 1210 ml  Output 2175 ml  Net -965 ml     Physical Exam  Awake Alert, No new F.N deficits, Normal affect [place].AT,PERRAL Supple Neck,No JVD, No cervical lymphadenopathy appriciated.  Symmetrical Chest wall movement, Good air movement bilaterally, CTAB RRR,No Gallops, Rubs or new Murmurs, No Parasternal Heave +ve B.Sounds, Abd Soft, No tenderness, No organomegaly appriciated, No rebound - guarding or rigidity. 2+ leg edema L>R, Unna boots placed.    Data Review:    CBC Recent Labs  Lab 07/26/19 0350 07/26/19 0350 07/27/19 0537 07/27/19 0537 07/28/19 0431 07/28/19 1255 07/28/19 1257 07/29/19 0434 07/30/19 0250 07/31/19 0352 08/01/19 0428  WBC 7.9   < > 8.2   < > 8.3  --   --  7.4  8.3 8.3 7.7  HGB 13.6   < > 13.8   < > 13.5   < > 15.3 13.1 13.5 14.0 12.8*  HCT 43.0   < > 43.5   < > 42.7   < > 45.0 41.7 42.1 44.6 40.4  PLT 197   < > 195   < > 204  --   --  206 205 236 214  MCV 93.5   < > 93.5   < > 93.6  --   --  95.4 93.8 95.5 94.4  MCH 29.6   < > 29.7   < > 29.6  --   --  30.0 30.1 30.0 29.9  MCHC 31.6   < > 31.7   < > 31.6  --   --  31.4 32.1 31.4 31.7  RDW 16.8*   < > 16.8*   < > 16.4*  --   --  17.0* 16.9* 17.0* 17.0*  LYMPHSABS 1.0  --  1.0  --  1.1  --   --   --   --   --   --   MONOABS 0.5  --  0.5  --  0.6  --   --   --   --   --   --   EOSABS 0.1  --  0.1  --  0.1  --   --   --   --   --   --    BASOSABS 0.0  --  0.0  --  0.1  --   --   --   --   --   --    < > = values in this interval not displayed.    Chemistries  Recent Labs  Lab 07/26/19 0350 07/26/19 0350 07/27/19 0537 07/27/19 0537 07/28/19 0431 07/28/19 1255 07/28/19 1257 07/29/19 0434 07/30/19 0250 07/31/19 0352 08/01/19 0428  NA 136   < > 139   < > 136   < > 137 137 135 137 136  K 3.6   < > 4.1   < > 4.2   < > 4.0 4.1 4.3 4.3 4.4  CL 94*   < > 95*   < > 94*  --   --  96* 95* 94* 97*  CO2 35*   < > 33*   < > 32  --   --  33* 31 34* 31  GLUCOSE 119*   < > 127*   < > 119*  --   --  119* 128* 129* 125*  BUN 16   < > 17   < > 19  --   --  16 19 18 18   CREATININE 0.92   < > 0.93   < > 0.96  --   --  0.90 0.99 0.98 0.99  CALCIUM 8.7*   < > 9.1   < > 8.9  --   --  9.1 8.9 9.4 8.9  MG 2.0   < > 2.0  --  1.9  --   --   --  1.9 2.1 2.0  AST 36  --  35  --  35  --   --   --   --   --   --   ALT 18  --  19  --  20  --   --   --   --   --   --   ALKPHOS 253*  --  287*  --  273*  --   --   --   --   --   --  BILITOT 2.3*  --  2.9*  --  2.8*  --   --   --   --   --   --    < > = values in this interval not displayed.   ------------------------------------------------------------------------------------------------------------------ No results for input(s): CHOL, HDL, LDLCALC, TRIG, CHOLHDL, LDLDIRECT in the last 72 hours.  Lab Results  Component Value Date   HGBA1C 6.1 (H) 07/21/2019   ------------------------------------------------------------------------------------------------------------------ No results for input(s): TSH, T4TOTAL, T3FREE, THYROIDAB in the last 72 hours.  Invalid input(s): FREET3 ------------------------------------------------------------------------------------------------------------------ No results for input(s): VITAMINB12, FOLATE, FERRITIN, TIBC, IRON, RETICCTPCT in the last 72 hours.  Coagulation profile No results for input(s): INR, PROTIME in the last 168 hours.  No results for  input(s): DDIMER in the last 72 hours.  Cardiac Enzymes No results for input(s): CKMB, TROPONINI, MYOGLOBIN in the last 168 hours.  Invalid input(s): CK ------------------------------------------------------------------------------------------------------------------    Component Value Date/Time   BNP 1,142.6 (H) 07/21/2019 1316    Inpatient Medications  Scheduled Meds: . aspirin EC  81 mg Oral Daily  . docusate sodium  100 mg Oral BID  . enoxaparin (LOVENOX) injection  60 mg Subcutaneous Q24H  . feeding supplement (ENSURE ENLIVE)  237 mL Oral Q1500  . feeding supplement (PRO-STAT SUGAR FREE 64)  30 mL Oral Daily  . levothyroxine  25 mcg Oral Q0600  . midodrine  5 mg Oral TID WC  . multivitamin with minerals  1 tablet Oral Daily  . saccharomyces boulardii  250 mg Oral BID  . sodium chloride flush  10-40 mL Intracatheter Q12H   Continuous Infusions:  PRN Meds:.acetaminophen **OR** [DISCONTINUED] acetaminophen, bisacodyl, hydrALAZINE, HYDROcodone-acetaminophen, [DISCONTINUED] ondansetron **OR** ondansetron (ZOFRAN) IV, polyethylene glycol  Micro Results No results found for this or any previous visit (from the past 240 hour(s)).  Radiology Reports DG Chest 2 View  Result Date: 07/21/2019 CLINICAL DATA:  Chest pain and shortness of breath EXAM: CHEST - 2 VIEW COMPARISON:  06/25/2018 FINDINGS: Congested appearance of vessels with small pleural effusions. Cardiomegaly that is chronic. Negative aortic contours. IMPRESSION: Cardiomegaly with vascular congestion and small pleural effusions. Electronically Signed   By: Monte Fantasia M.D.   On: 07/21/2019 08:42   CARDIAC CATHETERIZATION  Result Date: 07/28/2019  There is severe aortic valve stenosis.  1.  Patent coronary arteries with no obstructive CAD 2.  Severe calcification and restriction of the aortic valve leaflets with hemodynamic evidence of critical aortic stenosis.  Mean aortic gradient 57 mmHg, peak gradient 108 mmHg,  calculated aortic valve area 0.6 cm 3.  Elevated right and left heart pressures consistent with congestive heart failure Recommendations: Continued heart team evaluation for treatment options of critical aortic stenosis.  Continue IV diuresis as tolerated.  We will ask for inpatient cardiac surgical evaluation to help determine treatment plan.   CT CORONARY MORPH W/CTA COR W/SCORE W/CA W/CM &/OR WO/CM  Addendum Date: 07/27/2019   ADDENDUM REPORT: 07/27/2019 12:22 ADDENDUM: Extracardiac findings are described separately under dictation for contemporaneously obtained CTA chest, abdomen and pelvis performed on 07/25/2019. Please see dictation for that examination for description of relevant extracardiac findings. Electronically Signed   By: Vinnie Langton M.D.   On: 07/27/2019 12:22   Result Date: 07/27/2019 CLINICAL DATA:  54 year old male with severe aortic stenosis being evaluated for a TAVR procedure. EXAM: Cardiac TAVR CT TECHNIQUE: The patient was scanned on a [employer]. A 120 kV retrospective scan was triggered in the descending thoracic aorta at 111 HU's. Gantry rotation speed was  250 msecs and collimation was .6 mm. No beta blockade or nitro were given. The 3D data set was reconstructed in 5% intervals of the R-R cycle. Systolic and diastolic phases were analyzed on a dedicated work station using MPR, MIP and VRT modes. The patient received 80 cc of contrast. FINDINGS: Aortic Valve: Trileaflet aortic valve with severely calcified and thickened leaflets and only mild calcifications extending into the LVOT. Aorta: Normal size, minimal plaque, no calcifications, no dissection. Sinotubular Junction: 30 x 29 mm Ascending Thoracic Aorta: 35 x 34 mm Aortic Arch: Not visualized. Descending Thoracic Aorta: 22 x 22 mm Sinus of Valsalva Measurements: Non-coronary: 34 mm Right -coronary: 31 mm Left -coronary: 35 mm Coronary Artery Height above Annulus: Left Main: 13.1 mm Right Coronary: 18.2 mm  Virtual Basal Annulus Measurements: Maximum/Minimum Diameter: 34.0 x 25.7 mm Mean Diameter: 28.2 mm Perimeter: 94.1 mm Area: 626 mm2 Coronary Arteries: Calcium score is 0. Coronary Arteries:  Normal coronary origin.  Right dominance. RCA is a large dominant artery that gives rise to PDA and PLA. There is no plaque. Left main is a large artery that gives rise to LAD and LCX arteries. Left main has no plaque. LAD is a large vessel that has no plaque. LCX is a non-dominant artery that gives rise to one large OM1 branch. There is no plaque. Optimum Fluoroscopic Angle for Delivery: RAO 0 CRA 0 IMPRESSION: 1. Trileaflet aortic valve with severely calcified and thickened leaflets and only mild calcifications extending into the LVOT. Aortic valve calcium score 7968 consistent with severe aortic stenosis. Annular measurements suitable for delivery of a 29 mm Edwards-SAPIEN 3 valve. 2. Sufficient coronary to annulus distance. 3. Optimum Fluoroscopic Angle for Delivery:  RAO 0 CRA 0. 4. No thrombus in the left atrial appendage. 5. Coronary Arteries: Normal origin. Right dominance. Calcium score is 0. There are only minimal irregularities. 6. Dilated pulmonary artery measuring 35 suggestive of pulmonary hypertension. Electronically Signed: By: Ena Dawley On: 07/26/2019 13:01   CT ANGIO CHEST AORTA W/CM & OR WO/CM  Result Date: 07/27/2019 CLINICAL DATA:  54 year old male with history of severe aortic stenosis. Preprocedural study prior to potential transcatheter aortic valve replacement (TAVR) procedure. EXAM: CT ANGIOGRAPHY CHEST, ABDOMEN AND PELVIS TECHNIQUE: Non-contrast CT of the chest was initially obtained. Multidetector CT imaging through the chest, abdomen and pelvis was performed using the standard protocol during bolus administration of intravenous contrast. Multiplanar reconstructed images and MIPs were obtained and reviewed to evaluate the vascular anatomy. CONTRAST:  154m OMNIPAQUE IOHEXOL 350 MG/ML SOLN  COMPARISON:  No priors. FINDINGS: CTA CHEST FINDINGS Cardiovascular: Heart size is mildly enlarged. There is no significant pericardial fluid, thickening or pericardial calcification. Aortic atherosclerosis. No definite coronary artery calcifications. Severe thickening calcification of the aortic valve. Dilatation of the pulmonic trunk (3.6 cm in diameter). Mediastinum/Lymph Nodes: No pathologically enlarged mediastinal or hilar lymph nodes. Esophagus is unremarkable in appearance. No axillary lymphadenopathy. Lungs/Pleura: Patchy but diffuse ground-glass attenuation and mild interlobular septal thickening noted throughout the lungs bilaterally, suggesting a background of mild interstitial pulmonary edema. Moderate bilateral pleural effusions lying dependently with some associated passive subsegmental atelectasis in the lower lobes of the lungs bilaterally. No definite suspicious appearing pulmonary nodules or masses. Musculoskeletal/Soft Tissues: Old healed fracture of the mid sternum with mild posttraumatic deformity. There are no aggressive appearing lytic or blastic lesions noted in the visualized portions of the skeleton. CTA ABDOMEN AND PELVIS FINDINGS Hepatobiliary: Liver has a slightly shrunken appearance and nodular contour, indicative of  underlying cirrhosis. No discrete cystic or solid hepatic lesions. No intra or extrahepatic biliary ductal dilatation. Gallbladder is nearly completely decompressed, and otherwise unremarkable in appearance. Pancreas: No pancreatic mass. No pancreatic ductal dilatation. No pancreatic or peripancreatic fluid collections or inflammatory changes. Spleen: Unremarkable. Adrenals/Urinary Tract: Left kidney and bilateral adrenal glands are normal in appearance. In the lower pole of the right kidney there is an exophytic low-attenuation nonenhancing lesion measuring 8 cm in diameter, compatible with a simple cyst. No hydroureteronephrosis. Amorphous mass-like area of enhancement  along the posterior wall of the urinary bladder measuring approximately 6.4 x 2.0 x 2.5 cm (axial image 214 of series 6 and sagittal image 111 of series 9). Stomach/Bowel: Appearance of the stomach is normal. No pathologic dilatation of small bowel or colon. A few scattered colonic diverticulae are noted, without definite surrounding inflammatory changes to suggest an acute diverticulitis (study is limited by presence of small volume of ascites). Normal appendix. Vascular/Lymphatic: Aortic atherosclerosis with vascular findings and measurements pertinent to potential TAVR procedure, as detailed below. No aneurysm or dissection noted in the abdominal or pelvic vasculature. No lymphadenopathy noted in the abdomen or pelvis. Reproductive: Prostate gland and seminal vesicles are unremarkable in appearance. Other: Small volume of ascites.  No pneumoperitoneum. Musculoskeletal: There are no aggressive appearing lytic or blastic lesions noted in the visualized portions of the skeleton. Mild diffuse body wall edema. VASCULAR MEASUREMENTS PERTINENT TO TAVR: AORTA: Minimal Aortic Diameter-16 x 16 mm Severity of Aortic Calcification-mild RIGHT PELVIS: Right Common Iliac Artery - Minimal Diameter-9.0 x 11.1 mm Tortuosity - mild Calcification-minimal Right External Iliac Artery - Minimal Diameter-9.5 x 9.1 mm Tortuosity - mild Calcification-none Right Common Femoral Artery - Minimal Diameter-6.4 x 8.6 mm Tortuosity - mild Calcification-none LEFT PELVIS: Left Common Iliac Artery - Minimal Diameter-11.6 x 9.9 mm Tortuosity - mild Calcification-minimal Left External Iliac Artery - Minimal Diameter-11.5 x 8.8 mm Tortuosity - mild Calcification-none Left Common Femoral Artery - Minimal Diameter-9.1 x 8.7 mm Tortuosity-mild Calcification-none Review of the MIP images confirms the above findings. IMPRESSION: 1. Vascular findings and measurements pertinent to potential TAVR procedure, as detailed above. 2. Severe thickening  calcification of the aortic valve, compatible with reported clinical history of severe aortic stenosis. 3. Cardiomegaly with evidence of mild interstitial pulmonary edema in the lungs and moderate bilateral pleural effusions; imaging findings concerning for congestive heart failure. 4. Amorphous mass-like area of enhancement along the posterior wall of the urinary bladder measuring approximately 6.4 x 2.0 x 2.5 cm. Nonemergent Urologic consultation is strongly recommended in the near future to better evaluate this finding, as the possibility of an infiltrative bladder neoplasm is not excluded. 5. There is also dilatation of the pulmonic trunk (3.6 cm in diameter), concerning for pulmonary arterial hypertension. 6. Morphologic changes in the liver suggesting early cirrhosis. 7. Small volume of ascites. 8. Mild diffuse body wall edema. 9. Additional incidental findings, as above. These results will be called to the ordering clinician or representative by the Radiologist Assistant, and communication documented in the PACS or [employer]. Electronically Signed   By: Vinnie Langton M.D.   On: 07/27/2019 12:47   ECHOCARDIOGRAM COMPLETE  Result Date: 07/21/2019    ECHOCARDIOGRAM REPORT   Patient Name:   HERSHAL ERIKSSON Date of Exam: 07/21/2019 Medical Rec #:  [id number]   Height:       72.0 in Accession #:    [id number]  Weight:       310.0 lb Date of Birth:  09/13/1965  BSA:          2.567 m Patient Age:    61 years    BP:           102/73 mmHg Patient Gender: M           HR:           73 bpm. Exam Location:  Inpatient Procedure: 2D Echo Indications:    CHF-Acute Systolic 427.06 / C37.62  History:        Patient has no prior history of Echocardiogram examinations.                 Risk Factors:Obesity. Pleural effusion on right.  Sonographer:    Vikki Ports Turrentine Referring Phys: [place]  1. Left ventricular ejection fraction, by estimation, is 40 to 45%. The left ventricle has mildly  decreased function. The left ventricle demonstrates global hypokinesis. There is moderate left ventricular hypertrophy. Left ventricular diastolic parameters are consistent with Grade II diastolic dysfunction (pseudonormalization).  2. Right ventricular systolic function is mildly reduced. The right ventricular size is normal. There is moderately elevated pulmonary artery systolic pressure.  3. Left atrial size was moderately dilated.  4. The mitral valve is abnormal. Trivial mitral valve regurgitation.  5. Cannot exclude a bicuspid valve. Aortic valve regurgitation is mild. Critical aortic valve stenosis. Aortic valve area, by VTI measures 0.73 cm. Aortic valve mean gradient measures 96.2 mmHg. Peak gradient of 137 mmHg. Aortic valve Vmax measures 5.86 m/s.  6. The inferior vena cava is dilated in size with <50% respiratory variability, suggesting right atrial pressure of 15 mmHg.  7. Moderate pleural effusion in the right lateral region. FINDINGS  Left Ventricle: Left ventricular ejection fraction, by estimation, is 40 to 45%. The left ventricle has mildly decreased function. The left ventricle demonstrates global hypokinesis. The left ventricular internal cavity size was normal in size. There is  moderate left ventricular hypertrophy. Left ventricular diastolic parameters are consistent with Grade II diastolic dysfunction (pseudonormalization). Indeterminate filling pressures. Right Ventricle: The right ventricular size is normal. No increase in right ventricular wall thickness. Right ventricular systolic function is mildly reduced. There is moderately elevated pulmonary artery systolic pressure. The tricuspid regurgitant velocity is 3.35 m/s, and with an assumed right atrial pressure of 15 mmHg, the estimated right ventricular systolic pressure is 83.1 mmHg. Left Atrium: Left atrial size was moderately dilated. Right Atrium: Right atrial size was normal in size. Pericardium: There is no evidence of pericardial  effusion. Mitral Valve: The mitral valve is abnormal. Mild to moderate mitral annular calcification. Trivial mitral valve regurgitation. Tricuspid Valve: The tricuspid valve is grossly normal. Tricuspid valve regurgitation is mild. Aortic Valve: The aortic valve is tricuspid. . There is moderate thickening and severe calcifcation of the aortic valve. Aortic valve regurgitation is mild. Aortic regurgitation PHT measures 374 msec. Severe aortic stenosis is present. There is moderate thickening of the aortic valve. There is severe calcifcation of the aortic valve. Aortic valve mean gradient measures 96.2 mmHg. Aortic valve peak gradient measures 137.3 mmHg. Aortic valve area, by VTI measures 0.73 cm. Pulmonic Valve: The pulmonic valve was normal in structure. Pulmonic valve regurgitation is not visualized. Aorta: The aortic root and ascending aorta are structurally normal, with no evidence of dilitation. Venous: The inferior vena cava is dilated in size with less than 50% respiratory variability, suggesting right atrial pressure of 15 mmHg. IAS/Shunts: No atrial level shunt detected by color flow Doppler. Additional Comments: There is a moderate  pleural effusion in the right lateral region.  LEFT VENTRICLE PLAX 2D LVIDd:         5.50 cm  Diastology LVIDs:         4.40 cm  LV e' lateral:   9.23 cm/s LV PW:         1.40 cm  LV E/e' lateral: 10.2 LV IVS:        1.40 cm  LV e' medial:    7.05 cm/s LVOT diam:     2.50 cm  LV E/e' medial:  13.4 LV SV:         108 LV SV Index:   42 LVOT Area:     4.91 cm  RIGHT VENTRICLE RV S prime:     7.20 cm/s TAPSE (M-mode): 1.2 cm LEFT ATRIUM              Index       RIGHT ATRIUM           Index LA diam:        5.90 cm  2.30 cm/m  RA Area:     22.60 cm LA Vol (A2C):   121.0 ml 47.14 ml/m RA Volume:   68.80 ml  26.80 ml/m LA Vol (A4C):   95.5 ml  37.21 ml/m LA Biplane Vol: 110.0 ml 42.86 ml/m  AORTIC VALVE AV Area (Vmax):    0.82 cm AV Area (Vmean):   0.72 cm AV Area (VTI):      0.73 cm AV Vmax:           585.80 cm/s AV Vmean:          474.000 cm/s AV VTI:            1.480 m AV Peak Grad:      137.3 mmHg AV Mean Grad:      96.2 mmHg LVOT Vmax:         97.30 cm/s LVOT Vmean:        69.800 cm/s LVOT VTI:          0.220 m LVOT/AV VTI ratio: 0.15 AI PHT:            374 msec  AORTA Ao Root diam: 3.00 cm MITRAL VALVE               TRICUSPID VALVE MV Area (PHT): 5.54 cm    TR Peak grad:   44.9 mmHg MV Decel Time: 137 msec    TR Vmax:        335.00 cm/s MV E velocity: 94.30 cm/s MV A velocity: 46.30 cm/s  SHUNTS MV E/A ratio:  2.04        Systemic VTI:  0.22 m                            Systemic Diam: 2.50 cm Lyman Bishop MD Electronically signed by Lyman Bishop MD Signature Date/Time: 07/21/2019/3:42:23 PM    Final    VAS US CAROTID  Result Date: 07/27/2019 Carotid Arterial Duplex Study Indications:       Covid-19. Other Factors:     Critical Aortic Stenosis, CHF, Morbid Obesity. Limitations        Today's exam was limited due to the body habitus of the                    patient. Comparison Study:  No prior study on file for comparison Performing Technologist: Sharion Dove RVS  Examination  Guidelines: A complete evaluation includes B-mode imaging, spectral Doppler, color Doppler, and power Doppler as needed of all accessible portions of each vessel. Bilateral testing is considered an integral part of a complete examination. Limited examinations for reoccurring indications may be performed as noted.  Right Carotid Findings: +----------+--------+--------+--------+------------------+------------------+           PSV cm/sEDV cm/sStenosisPlaque DescriptionComments           +----------+--------+--------+--------+------------------+------------------+ CCA Prox  41      7                                 intimal thickening +----------+--------+--------+--------+------------------+------------------+ CCA Distal51      12                                intimal thickening  +----------+--------+--------+--------+------------------+------------------+ ICA Prox  42      12              heterogenous                         +----------+--------+--------+--------+------------------+------------------+ ICA Distal48      15                                                   +----------+--------+--------+--------+------------------+------------------+ ECA       62      10                                                   +----------+--------+--------+--------+------------------+------------------+ +----------+--------+-------+--------+-------------------+           PSV cm/sEDV cmsDescribeArm Pressure (mmHG) +----------+--------+-------+--------+-------------------+ [id number]                                         +----------+--------+-------+--------+-------------------+ +---------+--------+--+--------+-+ VertebralPSV cm/s28EDV cm/s9 +---------+--------+--+--------+-+  Left Carotid Findings: +----------+--------+--------+--------+------------------+------------------+           PSV cm/sEDV cm/sStenosisPlaque DescriptionComments           +----------+--------+--------+--------+------------------+------------------+ CCA Prox  73      12                                intimal thickening +----------+--------+--------+--------+------------------+------------------+ CCA Distal43      9                                 intimal thickening +----------+--------+--------+--------+------------------+------------------+ ICA Prox  54      11                                                   +----------+--------+--------+--------+------------------+------------------+ ICA Distal67      19                                                   +----------+--------+--------+--------+------------------+------------------+  ECA       53      5                                                     +----------+--------+--------+--------+------------------+------------------+ +----------+--------+--------+--------+-------------------+           PSV cm/sEDV cm/sDescribeArm Pressure (mmHG) +----------+--------+--------+--------+-------------------+ Subclavian113                                         +----------+--------+--------+--------+-------------------+ +---------+--------+--+--------+--+ VertebralPSV cm/s75EDV cm/s20 +---------+--------+--+--------+--+   Summary: Right Carotid: The extracranial vessels were near-normal with only minimal wall                thickening or plaque. Left Carotid: The extracranial vessels were near-normal with only minimal wall               thickening or plaque. Vertebrals:  Bilateral vertebral arteries demonstrate antegrade flow. Subclavians: Normal flow hemodynamics were seen in bilateral subclavian              arteries. *See table(s) above for measurements and observations.  Electronically signed by Servando Snare MD on 07/27/2019 at 12:53:06 AM.    Final    VAS [place] LOWER EXTREMITY VENOUS (DVT)  Result Date: 07/23/2019  Lower Venous DVTStudy Indications: Edema, and elevated ddimer.  Limitations: Body habitus and poor ultrasound/tissue interface. Comparison Study: no prior Performing Technologist: Abram Sander RVS  Examination Guidelines: A complete evaluation includes B-mode imaging, spectral Doppler, color Doppler, and power Doppler as needed of all accessible portions of each vessel. Bilateral testing is considered an integral part of a complete examination. Limited examinations for reoccurring indications may be performed as noted. The reflux portion of the exam is performed with the patient in reverse Trendelenburg.  +---------+---------------+---------+-----------+----------+--------------+ RIGHT    CompressibilityPhasicitySpontaneityPropertiesThrombus Aging +---------+---------------+---------+-----------+----------+--------------+ CFV       Full           Yes      Yes                                 +---------+---------------+---------+-----------+----------+--------------+ SFJ      Full                                                        +---------+---------------+---------+-----------+----------+--------------+ FV Prox  Full                                                        +---------+---------------+---------+-----------+----------+--------------+ FV Mid                  Yes      Yes                                 +---------+---------------+---------+-----------+----------+--------------+ FV Distal  Yes      Yes                                 +---------+---------------+---------+-----------+----------+--------------+ PFV      Full                                                        +---------+---------------+---------+-----------+----------+--------------+ POP      Full           Yes      Yes                                 +---------+---------------+---------+-----------+----------+--------------+ PTV      Full                                                        +---------+---------------+---------+-----------+----------+--------------+ PERO     Full                                                        +---------+---------------+---------+-----------+----------+--------------+   +---------+---------------+---------+-----------+----------+--------------+ LEFT     CompressibilityPhasicitySpontaneityPropertiesThrombus Aging +---------+---------------+---------+-----------+----------+--------------+ CFV      Full           Yes      Yes                                 +---------+---------------+---------+-----------+----------+--------------+ SFJ      Full                                                        +---------+---------------+---------+-----------+----------+--------------+ FV Prox  Full                                                         +---------+---------------+---------+-----------+----------+--------------+ FV Mid   Full                                                        +---------+---------------+---------+-----------+----------+--------------+ FV Distal               Yes      Yes                                 +---------+---------------+---------+-----------+----------+--------------+ PFV  Full                                                        +---------+---------------+---------+-----------+----------+--------------+ POP      Full           Yes      Yes                                 +---------+---------------+---------+-----------+----------+--------------+ PTV      Full                                                        +---------+---------------+---------+-----------+----------+--------------+ PERO                                                  Not visualized +---------+---------------+---------+-----------+----------+--------------+     Summary: BILATERAL: - No evidence of deep vein thrombosis seen in the lower extremities, bilaterally. -   *See table(s) above for measurements and observations. Electronically signed by Monica Martinez MD on 07/23/2019 at 5:31:24 PM.    Final    CT Angio Abd/Pel w/ and/or w/o  Result Date: 07/27/2019 CLINICAL DATA:  54 year old male with history of severe aortic stenosis. Preprocedural study prior to potential transcatheter aortic valve replacement (TAVR) procedure. EXAM: CT ANGIOGRAPHY CHEST, ABDOMEN AND PELVIS TECHNIQUE: Non-contrast CT of the chest was initially obtained. Multidetector CT imaging through the chest, abdomen and pelvis was performed using the standard protocol during bolus administration of intravenous contrast. Multiplanar reconstructed images and MIPs were obtained and reviewed to evaluate the vascular anatomy. CONTRAST:  157m OMNIPAQUE IOHEXOL 350 MG/ML SOLN COMPARISON:  No priors. FINDINGS: CTA  CHEST FINDINGS Cardiovascular: Heart size is mildly enlarged. There is no significant pericardial fluid, thickening or pericardial calcification. Aortic atherosclerosis. No definite coronary artery calcifications. Severe thickening calcification of the aortic valve. Dilatation of the pulmonic trunk (3.6 cm in diameter). Mediastinum/Lymph Nodes: No pathologically enlarged mediastinal or hilar lymph nodes. Esophagus is unremarkable in appearance. No axillary lymphadenopathy. Lungs/Pleura: Patchy but diffuse ground-glass attenuation and mild interlobular septal thickening noted throughout the lungs bilaterally, suggesting a background of mild interstitial pulmonary edema. Moderate bilateral pleural effusions lying dependently with some associated passive subsegmental atelectasis in the lower lobes of the lungs bilaterally. No definite suspicious appearing pulmonary nodules or masses. Musculoskeletal/Soft Tissues: Old healed fracture of the mid sternum with mild posttraumatic deformity. There are no aggressive appearing lytic or blastic lesions noted in the visualized portions of the skeleton. CTA ABDOMEN AND PELVIS FINDINGS Hepatobiliary: Liver has a slightly shrunken appearance and nodular contour, indicative of underlying cirrhosis. No discrete cystic or solid hepatic lesions. No intra or extrahepatic biliary ductal dilatation. Gallbladder is nearly completely decompressed, and otherwise unremarkable in appearance. Pancreas: No pancreatic mass. No pancreatic ductal dilatation. No pancreatic or peripancreatic fluid collections or inflammatory changes. Spleen: Unremarkable. Adrenals/Urinary Tract: Left kidney and bilateral adrenal glands are normal in appearance. In the lower pole of the right kidney there is  an exophytic low-attenuation nonenhancing lesion measuring 8 cm in diameter, compatible with a simple cyst. No hydroureteronephrosis. Amorphous mass-like area of enhancement along the posterior wall of the urinary  bladder measuring approximately 6.4 x 2.0 x 2.5 cm (axial image 214 of series 6 and sagittal image 111 of series 9). Stomach/Bowel: Appearance of the stomach is normal. No pathologic dilatation of small bowel or colon. A few scattered colonic diverticulae are noted, without definite surrounding inflammatory changes to suggest an acute diverticulitis (study is limited by presence of small volume of ascites). Normal appendix. Vascular/Lymphatic: Aortic atherosclerosis with vascular findings and measurements pertinent to potential TAVR procedure, as detailed below. No aneurysm or dissection noted in the abdominal or pelvic vasculature. No lymphadenopathy noted in the abdomen or pelvis. Reproductive: Prostate gland and seminal vesicles are unremarkable in appearance. Other: Small volume of ascites.  No pneumoperitoneum. Musculoskeletal: There are no aggressive appearing lytic or blastic lesions noted in the visualized portions of the skeleton. Mild diffuse body wall edema. VASCULAR MEASUREMENTS PERTINENT TO TAVR: AORTA: Minimal Aortic Diameter-16 x 16 mm Severity of Aortic Calcification-mild RIGHT PELVIS: Right Common Iliac Artery - Minimal Diameter-9.0 x 11.1 mm Tortuosity - mild Calcification-minimal Right External Iliac Artery - Minimal Diameter-9.5 x 9.1 mm Tortuosity - mild Calcification-none Right Common Femoral Artery - Minimal Diameter-6.4 x 8.6 mm Tortuosity - mild Calcification-none LEFT PELVIS: Left Common Iliac Artery - Minimal Diameter-11.6 x 9.9 mm Tortuosity - mild Calcification-minimal Left External Iliac Artery - Minimal Diameter-11.5 x 8.8 mm Tortuosity - mild Calcification-none Left Common Femoral Artery - Minimal Diameter-9.1 x 8.7 mm Tortuosity-mild Calcification-none Review of the MIP images confirms the above findings. IMPRESSION: 1. Vascular findings and measurements pertinent to potential TAVR procedure, as detailed above. 2. Severe thickening calcification of the aortic valve, compatible with  reported clinical history of severe aortic stenosis. 3. Cardiomegaly with evidence of mild interstitial pulmonary edema in the lungs and moderate bilateral pleural effusions; imaging findings concerning for congestive heart failure. 4. Amorphous mass-like area of enhancement along the posterior wall of the urinary bladder measuring approximately 6.4 x 2.0 x 2.5 cm. Nonemergent Urologic consultation is strongly recommended in the near future to better evaluate this finding, as the possibility of an infiltrative bladder neoplasm is not excluded. 5. There is also dilatation of the pulmonic trunk (3.6 cm in diameter), concerning for pulmonary arterial hypertension. 6. Morphologic changes in the liver suggesting early cirrhosis. 7. Small volume of ascites. 8. Mild diffuse body wall edema. 9. Additional incidental findings, as above. These results will be called to the ordering clinician or representative by the Radiologist Assistant, and communication documented in the PACS or [employer]. Electronically Signed   By: Vinnie Langton M.D.   On: 07/27/2019 12:47    Lala Lund M.D on 08/01/2019 at 10:45 AM   [facility] -  Office  [phone]

## 2019-08-02 ENCOUNTER — Other Ambulatory Visit (HOSPITAL_COMMUNITY): Payer: 59

## 2019-08-02 LAB — COMPREHENSIVE METABOLIC PANEL
ALT: 21 U/L (ref 0–44)
AST: 30 U/L (ref 15–41)
Albumin: 3.3 g/dL — ABNORMAL LOW (ref 3.5–5.0)
Alkaline Phosphatase: 271 U/L — ABNORMAL HIGH (ref 38–126)
Anion gap: 8 (ref 5–15)
BUN: 18 mg/dL (ref 6–20)
CO2: 29 mmol/L (ref 22–32)
Calcium: 8.8 mg/dL — ABNORMAL LOW (ref 8.9–10.3)
Chloride: 97 mmol/L — ABNORMAL LOW (ref 98–111)
Creatinine, Ser: 0.9 mg/dL (ref 0.61–1.24)
GFR calc Af Amer: >60 mL/min (ref >60)
GFR calc non Af Amer: >60 mL/min (ref >60)
Glucose, Bld: 118 mg/dL — ABNORMAL HIGH (ref 70–99)
Potassium: 4 mmol/L (ref 3.5–5.1)
Sodium: 134 mmol/L — ABNORMAL LOW (ref 135–145)
Total Bilirubin: 2.6 mg/dL — ABNORMAL HIGH (ref 0.3–1.2)
Total Protein: 6.9 g/dL (ref 6.5–8.1)

## 2019-08-02 LAB — CBC
HCT: 40.7 % (ref 39.0–52.0)
Hemoglobin: 12.8 g/dL — ABNORMAL LOW (ref 13.0–17.0)
MCH: 30.1 pg (ref 26.0–34.0)
MCHC: 31.4 g/dL (ref 30.0–36.0)
MCV: 95.8 fL (ref 80.0–100.0)
Platelets: 212 10*3/uL (ref 150–400)
RBC: 4.25 MIL/uL (ref 4.22–5.81)
RDW: 17 % — ABNORMAL HIGH (ref 11.5–15.5)
WBC: 7.4 10*3/uL (ref 4.0–10.5)
nRBC: 0 % (ref 0.0–0.2)

## 2019-08-02 LAB — C-REACTIVE PROTEIN: CRP: 1.7 mg/dL — ABNORMAL HIGH (ref <1.0)

## 2019-08-02 LAB — MAGNESIUM: Magnesium: 1.9 mg/dL (ref 1.7–2.4)

## 2019-08-02 NOTE — Progress Notes (Signed)
Orthopedic Tech Progress Note Patient Details:  Steve Richards [birth year] [id number]  Ortho Devices Type of Ortho Device: Haematologist Ortho Device/Splint Location: BLE Ortho Device/Splint Interventions: Ordered, Application   Post Interventions Patient Tolerated: Well Instructions Provided: Other (comment)   Steve Richards 08/02/2019, 3:50 PM

## 2019-08-02 NOTE — Progress Notes (Signed)
PROGRESS NOTE                                                                                                                                                                                                             Patient Demographics:    Steve Richards, is a 54 y.o. male, DOB - [birth year], ZOX:[id number]  Admit date - 07/21/2019   Admitting Physician Karmen Bongo, MD  Outpatient Primary MD for the patient is System, Pcp Not In  LOS - 12   Chief Complaint  Patient presents with  . Shortness of Breath  . Leg Swelling       Brief Narrative     Steve Richards is a 55 y.o. male with medical history significant of R pleural effusion in April 2021 presenting with SOB and LE edema.    Patient did not follow with PCP for last 6 years, patient with recent work-up as an outpatient and negative for CHF, volume overload with thoracentesis status post drain, as well evidence of liver cirrhosis, and recent diagnosis of left lower extremity cellulitis where he was started on Keflex, ED secondary to dyspnea, evidence of volume overload, as well he tested Covid positive on admission .  His work-up was significant for volume overload, with severe aortic stenosis, acute systolic/diastolic CHF, he has been seen by cardiology/structural cardiology team for his severe aortic stenosis, imaging was significant for questionable bladder mass, urology has been following as well.   Subjective:   Patient in bed, appears comfortable, denies any headache, no fever, no chest pain or pressure, no shortness of breath , no abdominal pain. No focal weakness.   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 he has not seen a physician for 6 to 7 years prior to admission, cardiology on board has been diuresed over 25 L since admission up to 07/31/2019, now blood pressure too low hold further diuresis until blood pressure stabilizes  and then use as needed diuretics, he has been diuresed close to 50 pounds this admission, continue UNNA Boots to assist in fluid mobilization.  Blood pressure too low to tolerate ACE, ARB or Entresto, beta-blocker overall better from pulmonary standpoint.  He was evaluated by TAVR team along with Cardiothoracic surgery for severe AS & open heart surgery  is likely to happen middle of the coming week, case discussed with cardiothoracic surgery on 07/31/2019.    Severe aortic stenosis -see above, avoid severe hypotension.   COVID-19 infection - incidental finding no treatment.   Hypotension.  Currently on midodrine and holding further diuresis.    D-dimers elevated at 6 on admission, it is contrary it is currently trending down, tinea with current DVT prophylaxis dose, his venous Doppler negative for DVT .  COVID-19 Labs  Recent Labs    07/31/19 0352 08/01/19 0428 08/02/19 0500  CRP 1.8* 1.6* 1.7*    Lab Results  Component Value Date   SARSCOV2NAA POSITIVE (A) 07/21/2019   [place] NEGATIVE 06/23/2019    Liver cirrhosis - Patient denies any history of alcohol abuse in the past, this is most likely related to cardiac cirrhosis versus NASH.  Negative hepatitis panel, PCP to monitor and arrange for outpatient GI follow-up, if blood pressure allows will add low-dose beta-blocker.   Left lower extremity cellulitis - has been treated with antibiotics this admission and will nowl monitor clinically.  Some discoloration is due to massive edema.   Questionable findings of bladder mass - Urology consult done, bedside cystoscopy on 07/29/2019 unremarkable, outpatient follow-up with urology post discharge.  Hyperglycemia  -A1c is 6.1, prediabetic PCP to monitor.  Morbid obesity - BMI 35 follow with PCP for weight loss.  Hypothyroidism -TSH elevated at 14, with low normal free T4 at 0.65, started on low-dose Synthroid, check TSH in 4 to 6 weeks.   Code Status : Full  Disposition Plan  :    Status is: Inpatient  Remains inpatient appropriate because:IV treatments appropriate due to intensity of illness or inability to take PO   Dispo: The patient is from: Home              Anticipated d/c is to: Home              Anticipated d/c date is: 3 days              Patient currently is not medically stable to d/c.  Patient still with significant volume overload, requiring further IV diuresis.  Plan for cardiac cath today, and bedside cystoscopy tomorrow.   Consults  :  Cardiology,Urology, cardiothoracic surgery  Procedures  :   Heart catheterization on 07/28/2019.     1.  Patent coronary arteries with no obstructive CAD 2.  Severe calcification and restriction of the aortic valve leaflets with hemodynamic evidence of critical aortic stenosis.  Mean aortic gradient 57 mmHg, peak gradient 108 mmHg, calculated aortic valve area 0.6 cm 3.  Elevated right and left heart pressures consistent with congestive heart failure   Bedside cystoscopy on 07/29/2019.  Unremarkable.  Bilateral leg ultrasound.  No DVT.    CTA -  1. Vascular findings and measurements pertinent to potential TAVR procedure, as detailed above. 2. Severe thickening calcification of the aortic valve, compatible with reported clinical history of severe aortic stenosis. 3. Cardiomegaly with evidence of mild interstitial pulmonary edema in the lungs and moderate bilateral pleural effusions; imaging findings concerning for congestive heart failure. 4. Amorphous mass-like area of enhancement along the posterior wall of the urinary bladder measuring approximately 6.4 x 2.0 x 2.5 cm. Nonemergent Urologic consultation is strongly recommended in the near future to better evaluate this finding, as the possibility of an infiltrative bladder neoplasm is not excluded. 5. There is also dilatation of the pulmonic trunk (3.6 cm in diameter), concerning for pulmonary arterial hypertension.  6. Morphologic changes in the liver suggesting early  cirrhosis. 7. Small volume of ascites. 8. Mild diffuse body wall edema.  TTE  1. Left ventricular ejection fraction, by estimation, is 40 to 45%. The left ventricle has mildly decreased function. The left ventricle demonstrates global hypokinesis. There is moderate left ventricular hypertrophy. Left ventricular diastolic parameters are consistent with Grade II diastolic dysfunction  (pseudonormalization).  2. Right ventricular systolic function is mildly reduced. The right ventricular size is normal. There is moderately elevated pulmonary artery  systolic pressure.  3. Left atrial size was moderately dilated.  4. The mitral valve is abnormal. Trivial mitral valve regurgitation.  5. Cannot exclude a bicuspid valve. Aortic valve regurgitation is mild. Critical aortic valve stenosis. Aortic valve area, by VTI measures 0.73  cm. Aortic valve mean gradient measures 96.2 mmHg. Peak gradient of 137 mmHg. Aortic valve Vmax measures 5.86 m/s.  6. The inferior vena cava is dilated in size with <50% respiratory variability, suggesting right atrial pressure of 15 mmHg.  7. Moderate pleural effusion in the right lateral region.     DVT Prophylaxis  :  [place] lovenox  Lab Results  Component Value Date   PLT 212 08/02/2019    Antibiotics  :   Anti-infectives (From admission, onward)   Start     Dose/Rate Route Frequency Ordered Stop   07/25/19 1530  doxycycline (VIBRA-TABS) tablet 100 mg  Status:  Discontinued     100 mg Oral Every 12 hours 07/25/19 1521 07/27/19 0809   07/22/19 1400  ceFAZolin (ANCEF) IVPB 2g/100 mL premix  Status:  Discontinued     2 g 200 mL/hr over 30 Minutes Intravenous Every 8 hours 07/22/19 1223 07/25/19 1521   07/22/19 0000  vancomycin (VANCOREADY) IVPB 1250 mg/250 mL  Status:  Discontinued     1,250 mg 166.7 mL/hr over 90 Minutes Intravenous Every 12 hours 07/21/19 1141 07/22/19 1223   07/21/19 1145  vancomycin (VANCOREADY) IVPB 2000 mg/400 mL     2,000 mg 200  mL/hr over 120 Minutes Intravenous  Once 07/21/19 1141 07/21/19 1509        Objective:   Vitals:   08/01/19 1639 08/01/19 2037 08/02/19 0426 08/02/19 0840  BP: 90/69 (!) 88/67 100/77 92/75  Pulse: 74 72 72 79  Resp: 18 18 18 17   Temp: 97.7 F (36.5 C) 98.6 F (37 C)  98.2 F (36.8 C)  TempSrc: Oral Oral  Oral  SpO2: 94% 95% 96% (!) 89%  Weight:   118 kg   Height:        Wt Readings from Last 3 Encounters:  08/02/19 118 kg  09/24/11 125.2 kg     Intake/Output Summary (Last 24 hours) at 08/02/2019 1033 Last data filed at 08/02/2019 0800 Gross per 24 hour  Intake 840 ml  Output 2136 ml  Net -1296 ml     Physical Exam  Awake Alert, No new F.N deficits, Normal affect [place].AT,PERRAL Supple Neck,No JVD, No cervical lymphadenopathy appriciated.  Symmetrical Chest wall movement, Good air movement bilaterally, CTAB RRR,No Gallops, Rubs, positive aortic systolic murmur 2/5, No Parasternal Heave +ve B.Sounds, Abd Soft, No tenderness, No organomegaly appriciated, No rebound - guarding or rigidity. 2+ leg edema L>R, Unna boots placed.    Data Review:    CBC Recent Labs  Lab 07/27/19 0537 07/27/19 0537 07/28/19 0431 07/28/19 1255 07/29/19 0434 07/30/19 0250 07/31/19 0352 08/01/19 0428 08/02/19 0500  WBC 8.2   < > 8.3  --  7.4 8.3 8.3  7.7 7.4  HGB 13.8   < > 13.5   < > 13.1 13.5 14.0 12.8* 12.8*  HCT 43.5   < > 42.7   < > 41.7 42.1 44.6 40.4 40.7  PLT 195   < > 204  --  206 205 236 214 212  MCV 93.5   < > 93.6  --  95.4 93.8 95.5 94.4 95.8  MCH 29.7   < > 29.6  --  30.0 30.1 30.0 29.9 30.1  MCHC 31.7   < > 31.6  --  31.4 32.1 31.4 31.7 31.4  RDW 16.8*   < > 16.4*  --  17.0* 16.9* 17.0* 17.0* 17.0*  LYMPHSABS 1.0  --  1.1  --   --   --   --   --   --   MONOABS 0.5  --  0.6  --   --   --   --   --   --   EOSABS 0.1  --  0.1  --   --   --   --   --   --   BASOSABS 0.0  --  0.1  --   --   --   --   --   --    < > = values in this interval not displayed.     Chemistries  Recent Labs  Lab 07/27/19 0537 07/27/19 0537 07/28/19 0431 07/28/19 1255 07/29/19 0434 07/30/19 0250 07/31/19 0352 08/01/19 0428 08/02/19 0500  NA 139   < > 136   < > 137 135 137 136 134*  K 4.1   < > 4.2   < > 4.1 4.3 4.3 4.4 4.0  CL 95*   < > 94*  --  96* 95* 94* 97* 97*  CO2 33*   < > 32  --  33* 31 34* 31 29  GLUCOSE 127*   < > 119*  --  119* 128* 129* 125* 118*  BUN 17   < > 19  --  16 19 18 18 18   CREATININE 0.93   < > 0.96  --  0.90 0.99 0.98 0.99 0.90  CALCIUM 9.1   < > 8.9  --  9.1 8.9 9.4 8.9 8.8*  MG 2.0   < > 1.9  --   --  1.9 2.1 2.0 1.9  AST 35  --  35  --   --   --   --   --  30  ALT 19  --  20  --   --   --   --   --  21  ALKPHOS 287*  --  273*  --   --   --   --   --  271*  BILITOT 2.9*  --  2.8*  --   --   --   --   --  2.6*   < > = values in this interval not displayed.   ------------------------------------------------------------------------------------------------------------------ No results for input(s): CHOL, HDL, LDLCALC, TRIG, CHOLHDL, LDLDIRECT in the last 72 hours.  Lab Results  Component Value Date   HGBA1C 6.1 (H) 07/21/2019   ------------------------------------------------------------------------------------------------------------------ No results for input(s): TSH, T4TOTAL, T3FREE, THYROIDAB in the last 72 hours.  Invalid input(s): FREET3 ------------------------------------------------------------------------------------------------------------------ No results for input(s): VITAMINB12, FOLATE, FERRITIN, TIBC, IRON, RETICCTPCT in the last 72 hours.  Coagulation profile No results for input(s): INR, PROTIME in the last 168 hours.  No results for input(s): DDIMER in the last 72 hours.  Cardiac  Enzymes No results for input(s): CKMB, TROPONINI, MYOGLOBIN in the last 168 hours.  Invalid input(s): CK ------------------------------------------------------------------------------------------------------------------     Component Value Date/Time   BNP 1,142.6 (H) 07/21/2019 1316    Inpatient Medications  Scheduled Meds: . aspirin EC  81 mg Oral Daily  . docusate sodium  100 mg Oral BID  . enoxaparin (LOVENOX) injection  60 mg Subcutaneous Q24H  . feeding supplement (ENSURE ENLIVE)  237 mL Oral Q1500  . feeding supplement (PRO-STAT SUGAR FREE 64)  30 mL Oral Daily  . levothyroxine  25 mcg Oral Q0600  . midodrine  5 mg Oral TID WC  . multivitamin with minerals  1 tablet Oral Daily  . saccharomyces boulardii  250 mg Oral BID  . sodium chloride flush  10-40 mL Intracatheter Q12H   Continuous Infusions:  PRN Meds:.acetaminophen **OR** [DISCONTINUED] acetaminophen, bisacodyl, hydrALAZINE, HYDROcodone-acetaminophen, [DISCONTINUED] ondansetron **OR** ondansetron (ZOFRAN) IV, polyethylene glycol  Micro Results No results found for this or any previous visit (from the past 240 hour(s)).  Radiology Reports DG Chest 2 View  Result Date: 07/21/2019 CLINICAL DATA:  Chest pain and shortness of breath EXAM: CHEST - 2 VIEW COMPARISON:  06/25/2018 FINDINGS: Congested appearance of vessels with small pleural effusions. Cardiomegaly that is chronic. Negative aortic contours. IMPRESSION: Cardiomegaly with vascular congestion and small pleural effusions. Electronically Signed   By: Monte Fantasia M.D.   On: 07/21/2019 08:42   CARDIAC CATHETERIZATION  Result Date: 07/28/2019  There is severe aortic valve stenosis.  1.  Patent coronary arteries with no obstructive CAD 2.  Severe calcification and restriction of the aortic valve leaflets with hemodynamic evidence of critical aortic stenosis.  Mean aortic gradient 57 mmHg, peak gradient 108 mmHg, calculated aortic valve area 0.6 cm 3.  Elevated right and left heart pressures consistent with congestive heart failure Recommendations: Continued heart team evaluation for treatment options of critical aortic stenosis.  Continue IV diuresis as tolerated.  We will ask for  inpatient cardiac surgical evaluation to help determine treatment plan.   CT CORONARY MORPH W/CTA COR W/SCORE W/CA W/CM &/OR WO/CM  Addendum Date: 07/27/2019   ADDENDUM REPORT: 07/27/2019 12:22 ADDENDUM: Extracardiac findings are described separately under dictation for contemporaneously obtained CTA chest, abdomen and pelvis performed on 07/25/2019. Please see dictation for that examination for description of relevant extracardiac findings. Electronically Signed   By: Vinnie Langton M.D.   On: 07/27/2019 12:22   Result Date: 07/27/2019 CLINICAL DATA:  54 year old male with severe aortic stenosis being evaluated for a TAVR procedure. EXAM: Cardiac TAVR CT TECHNIQUE: The patient was scanned on a [employer]. A 120 kV retrospective scan was triggered in the descending thoracic aorta at 111 HU's. Gantry rotation speed was 250 msecs and collimation was .6 mm. No beta blockade or nitro were given. The 3D data set was reconstructed in 5% intervals of the R-R cycle. Systolic and diastolic phases were analyzed on a dedicated work station using MPR, MIP and VRT modes. The patient received 80 cc of contrast. FINDINGS: Aortic Valve: Trileaflet aortic valve with severely calcified and thickened leaflets and only mild calcifications extending into the LVOT. Aorta: Normal size, minimal plaque, no calcifications, no dissection. Sinotubular Junction: 30 x 29 mm Ascending Thoracic Aorta: 35 x 34 mm Aortic Arch: Not visualized. Descending Thoracic Aorta: 22 x 22 mm Sinus of Valsalva Measurements: Non-coronary: 34 mm Right -coronary: 31 mm Left -coronary: 35 mm Coronary Artery Height above Annulus: Left Main: 13.1 mm Right Coronary: 18.2 mm Virtual  Basal Annulus Measurements: Maximum/Minimum Diameter: 34.0 x 25.7 mm Mean Diameter: 28.2 mm Perimeter: 94.1 mm Area: 626 mm2 Coronary Arteries: Calcium score is 0. Coronary Arteries:  Normal coronary origin.  Right dominance. RCA is a large dominant artery that gives rise  to PDA and PLA. There is no plaque. Left main is a large artery that gives rise to LAD and LCX arteries. Left main has no plaque. LAD is a large vessel that has no plaque. LCX is a non-dominant artery that gives rise to one large OM1 branch. There is no plaque. Optimum Fluoroscopic Angle for Delivery: RAO 0 CRA 0 IMPRESSION: 1. Trileaflet aortic valve with severely calcified and thickened leaflets and only mild calcifications extending into the LVOT. Aortic valve calcium score 7968 consistent with severe aortic stenosis. Annular measurements suitable for delivery of a 29 mm Edwards-SAPIEN 3 valve. 2. Sufficient coronary to annulus distance. 3. Optimum Fluoroscopic Angle for Delivery:  RAO 0 CRA 0. 4. No thrombus in the left atrial appendage. 5. Coronary Arteries: Normal origin. Right dominance. Calcium score is 0. There are only minimal irregularities. 6. Dilated pulmonary artery measuring 35 suggestive of pulmonary hypertension. Electronically Signed: By: Ena Dawley On: 07/26/2019 13:01   CT ANGIO CHEST AORTA W/CM & OR WO/CM  Result Date: 07/27/2019 CLINICAL DATA:  54 year old male with history of severe aortic stenosis. Preprocedural study prior to potential transcatheter aortic valve replacement (TAVR) procedure. EXAM: CT ANGIOGRAPHY CHEST, ABDOMEN AND PELVIS TECHNIQUE: Non-contrast CT of the chest was initially obtained. Multidetector CT imaging through the chest, abdomen and pelvis was performed using the standard protocol during bolus administration of intravenous contrast. Multiplanar reconstructed images and MIPs were obtained and reviewed to evaluate the vascular anatomy. CONTRAST:  175m OMNIPAQUE IOHEXOL 350 MG/ML SOLN COMPARISON:  No priors. FINDINGS: CTA CHEST FINDINGS Cardiovascular: Heart size is mildly enlarged. There is no significant pericardial fluid, thickening or pericardial calcification. Aortic atherosclerosis. No definite coronary artery calcifications. Severe thickening  calcification of the aortic valve. Dilatation of the pulmonic trunk (3.6 cm in diameter). Mediastinum/Lymph Nodes: No pathologically enlarged mediastinal or hilar lymph nodes. Esophagus is unremarkable in appearance. No axillary lymphadenopathy. Lungs/Pleura: Patchy but diffuse ground-glass attenuation and mild interlobular septal thickening noted throughout the lungs bilaterally, suggesting a background of mild interstitial pulmonary edema. Moderate bilateral pleural effusions lying dependently with some associated passive subsegmental atelectasis in the lower lobes of the lungs bilaterally. No definite suspicious appearing pulmonary nodules or masses. Musculoskeletal/Soft Tissues: Old healed fracture of the mid sternum with mild posttraumatic deformity. There are no aggressive appearing lytic or blastic lesions noted in the visualized portions of the skeleton. CTA ABDOMEN AND PELVIS FINDINGS Hepatobiliary: Liver has a slightly shrunken appearance and nodular contour, indicative of underlying cirrhosis. No discrete cystic or solid hepatic lesions. No intra or extrahepatic biliary ductal dilatation. Gallbladder is nearly completely decompressed, and otherwise unremarkable in appearance. Pancreas: No pancreatic mass. No pancreatic ductal dilatation. No pancreatic or peripancreatic fluid collections or inflammatory changes. Spleen: Unremarkable. Adrenals/Urinary Tract: Left kidney and bilateral adrenal glands are normal in appearance. In the lower pole of the right kidney there is an exophytic low-attenuation nonenhancing lesion measuring 8 cm in diameter, compatible with a simple cyst. No hydroureteronephrosis. Amorphous mass-like area of enhancement along the posterior wall of the urinary bladder measuring approximately 6.4 x 2.0 x 2.5 cm (axial image 214 of series 6 and sagittal image 111 of series 9). Stomach/Bowel: Appearance of the stomach is normal. No pathologic dilatation of small bowel or colon.  A few  scattered colonic diverticulae are noted, without definite surrounding inflammatory changes to suggest an acute diverticulitis (study is limited by presence of small volume of ascites). Normal appendix. Vascular/Lymphatic: Aortic atherosclerosis with vascular findings and measurements pertinent to potential TAVR procedure, as detailed below. No aneurysm or dissection noted in the abdominal or pelvic vasculature. No lymphadenopathy noted in the abdomen or pelvis. Reproductive: Prostate gland and seminal vesicles are unremarkable in appearance. Other: Small volume of ascites.  No pneumoperitoneum. Musculoskeletal: There are no aggressive appearing lytic or blastic lesions noted in the visualized portions of the skeleton. Mild diffuse body wall edema. VASCULAR MEASUREMENTS PERTINENT TO TAVR: AORTA: Minimal Aortic Diameter-16 x 16 mm Severity of Aortic Calcification-mild RIGHT PELVIS: Right Common Iliac Artery - Minimal Diameter-9.0 x 11.1 mm Tortuosity - mild Calcification-minimal Right External Iliac Artery - Minimal Diameter-9.5 x 9.1 mm Tortuosity - mild Calcification-none Right Common Femoral Artery - Minimal Diameter-6.4 x 8.6 mm Tortuosity - mild Calcification-none LEFT PELVIS: Left Common Iliac Artery - Minimal Diameter-11.6 x 9.9 mm Tortuosity - mild Calcification-minimal Left External Iliac Artery - Minimal Diameter-11.5 x 8.8 mm Tortuosity - mild Calcification-none Left Common Femoral Artery - Minimal Diameter-9.1 x 8.7 mm Tortuosity-mild Calcification-none Review of the MIP images confirms the above findings. IMPRESSION: 1. Vascular findings and measurements pertinent to potential TAVR procedure, as detailed above. 2. Severe thickening calcification of the aortic valve, compatible with reported clinical history of severe aortic stenosis. 3. Cardiomegaly with evidence of mild interstitial pulmonary edema in the lungs and moderate bilateral pleural effusions; imaging findings concerning for congestive heart  failure. 4. Amorphous mass-like area of enhancement along the posterior wall of the urinary bladder measuring approximately 6.4 x 2.0 x 2.5 cm. Nonemergent Urologic consultation is strongly recommended in the near future to better evaluate this finding, as the possibility of an infiltrative bladder neoplasm is not excluded. 5. There is also dilatation of the pulmonic trunk (3.6 cm in diameter), concerning for pulmonary arterial hypertension. 6. Morphologic changes in the liver suggesting early cirrhosis. 7. Small volume of ascites. 8. Mild diffuse body wall edema. 9. Additional incidental findings, as above. These results will be called to the ordering clinician or representative by the Radiologist Assistant, and communication documented in the PACS or [employer]. Electronically Signed   By: Vinnie Langton M.D.   On: 07/27/2019 12:47   ECHOCARDIOGRAM COMPLETE  Result Date: 07/21/2019    ECHOCARDIOGRAM REPORT   Patient Name:   Steve Richards Date of Exam: 07/21/2019 Medical Rec #:  [id number]   Height:       72.0 in Accession #:    [id number]  Weight:       310.0 lb Date of Birth:  11-20-1965   BSA:          2.567 m Patient Age:    77 years    BP:           102/73 mmHg Patient Gender: M           HR:           73 bpm. Exam Location:  Inpatient Procedure: 2D Echo Indications:    CHF-Acute Systolic 629.47 / M54.65  History:        Patient has no prior history of Echocardiogram examinations.                 Risk Factors:Obesity. Pleural effusion on right.  Sonographer:    Vikki Ports Turrentine Referring Phys: [place]  1. Left ventricular  ejection fraction, by estimation, is 40 to 45%. The left ventricle has mildly decreased function. The left ventricle demonstrates global hypokinesis. There is moderate left ventricular hypertrophy. Left ventricular diastolic parameters are consistent with Grade II diastolic dysfunction (pseudonormalization).  2. Right ventricular systolic function is mildly  reduced. The right ventricular size is normal. There is moderately elevated pulmonary artery systolic pressure.  3. Left atrial size was moderately dilated.  4. The mitral valve is abnormal. Trivial mitral valve regurgitation.  5. Cannot exclude a bicuspid valve. Aortic valve regurgitation is mild. Critical aortic valve stenosis. Aortic valve area, by VTI measures 0.73 cm. Aortic valve mean gradient measures 96.2 mmHg. Peak gradient of 137 mmHg. Aortic valve Vmax measures 5.86 m/s.  6. The inferior vena cava is dilated in size with <50% respiratory variability, suggesting right atrial pressure of 15 mmHg.  7. Moderate pleural effusion in the right lateral region. FINDINGS  Left Ventricle: Left ventricular ejection fraction, by estimation, is 40 to 45%. The left ventricle has mildly decreased function. The left ventricle demonstrates global hypokinesis. The left ventricular internal cavity size was normal in size. There is  moderate left ventricular hypertrophy. Left ventricular diastolic parameters are consistent with Grade II diastolic dysfunction (pseudonormalization). Indeterminate filling pressures. Right Ventricle: The right ventricular size is normal. No increase in right ventricular wall thickness. Right ventricular systolic function is mildly reduced. There is moderately elevated pulmonary artery systolic pressure. The tricuspid regurgitant velocity is 3.35 m/s, and with an assumed right atrial pressure of 15 mmHg, the estimated right ventricular systolic pressure is 82.4 mmHg. Left Atrium: Left atrial size was moderately dilated. Right Atrium: Right atrial size was normal in size. Pericardium: There is no evidence of pericardial effusion. Mitral Valve: The mitral valve is abnormal. Mild to moderate mitral annular calcification. Trivial mitral valve regurgitation. Tricuspid Valve: The tricuspid valve is grossly normal. Tricuspid valve regurgitation is mild. Aortic Valve: The aortic valve is tricuspid. .  There is moderate thickening and severe calcifcation of the aortic valve. Aortic valve regurgitation is mild. Aortic regurgitation PHT measures 374 msec. Severe aortic stenosis is present. There is moderate thickening of the aortic valve. There is severe calcifcation of the aortic valve. Aortic valve mean gradient measures 96.2 mmHg. Aortic valve peak gradient measures 137.3 mmHg. Aortic valve area, by VTI measures 0.73 cm. Pulmonic Valve: The pulmonic valve was normal in structure. Pulmonic valve regurgitation is not visualized. Aorta: The aortic root and ascending aorta are structurally normal, with no evidence of dilitation. Venous: The inferior vena cava is dilated in size with less than 50% respiratory variability, suggesting right atrial pressure of 15 mmHg. IAS/Shunts: No atrial level shunt detected by color flow Doppler. Additional Comments: There is a moderate pleural effusion in the right lateral region.  LEFT VENTRICLE PLAX 2D LVIDd:         5.50 cm  Diastology LVIDs:         4.40 cm  LV e' lateral:   9.23 cm/s LV PW:         1.40 cm  LV E/e' lateral: 10.2 LV IVS:        1.40 cm  LV e' medial:    7.05 cm/s LVOT diam:     2.50 cm  LV E/e' medial:  13.4 LV SV:         108 LV SV Index:   42 LVOT Area:     4.91 cm  RIGHT VENTRICLE RV S prime:     7.20 cm/s TAPSE (  M-mode): 1.2 cm LEFT ATRIUM              Index       RIGHT ATRIUM           Index LA diam:        5.90 cm  2.30 cm/m  RA Area:     22.60 cm LA Vol (A2C):   121.0 ml 47.14 ml/m RA Volume:   68.80 ml  26.80 ml/m LA Vol (A4C):   95.5 ml  37.21 ml/m LA Biplane Vol: 110.0 ml 42.86 ml/m  AORTIC VALVE AV Area (Vmax):    0.82 cm AV Area (Vmean):   0.72 cm AV Area (VTI):     0.73 cm AV Vmax:           585.80 cm/s AV Vmean:          474.000 cm/s AV VTI:            1.480 m AV Peak Grad:      137.3 mmHg AV Mean Grad:      96.2 mmHg LVOT Vmax:         97.30 cm/s LVOT Vmean:        69.800 cm/s LVOT VTI:          0.220 m LVOT/AV VTI ratio: 0.15 AI PHT:             374 msec  AORTA Ao Root diam: 3.00 cm MITRAL VALVE               TRICUSPID VALVE MV Area (PHT): 5.54 cm    TR Peak grad:   44.9 mmHg MV Decel Time: 137 msec    TR Vmax:        335.00 cm/s MV E velocity: 94.30 cm/s MV A velocity: 46.30 cm/s  SHUNTS MV E/A ratio:  2.04        Systemic VTI:  0.22 m                            Systemic Diam: 2.50 cm Lyman Bishop MD Electronically signed by Lyman Bishop MD Signature Date/Time: 07/21/2019/3:42:23 PM    Final    VAS US CAROTID  Result Date: 07/27/2019 Carotid Arterial Duplex Study Indications:       Covid-19. Other Factors:     Critical Aortic Stenosis, CHF, Morbid Obesity. Limitations        Today's exam was limited due to the body habitus of the                    patient. Comparison Study:  No prior study on file for comparison Performing Technologist: Sharion Dove RVS  Examination Guidelines: A complete evaluation includes B-mode imaging, spectral Doppler, color Doppler, and power Doppler as needed of all accessible portions of each vessel. Bilateral testing is considered an integral part of a complete examination. Limited examinations for reoccurring indications may be performed as noted.  Right Carotid Findings: +----------+--------+--------+--------+------------------+------------------+           PSV cm/sEDV cm/sStenosisPlaque DescriptionComments           +----------+--------+--------+--------+------------------+------------------+ CCA Prox  41      7                                 intimal thickening +----------+--------+--------+--------+------------------+------------------+ CCA Distal51      12  intimal thickening +----------+--------+--------+--------+------------------+------------------+ ICA Prox  42      12              heterogenous                         +----------+--------+--------+--------+------------------+------------------+ ICA Distal48      15                                                    +----------+--------+--------+--------+------------------+------------------+ ECA       62      10                                                   +----------+--------+--------+--------+------------------+------------------+ +----------+--------+-------+--------+-------------------+           PSV cm/sEDV cmsDescribeArm Pressure (mmHG) +----------+--------+-------+--------+-------------------+ [id number]                                         +----------+--------+-------+--------+-------------------+ +---------+--------+--+--------+-+ VertebralPSV cm/s28EDV cm/s9 +---------+--------+--+--------+-+  Left Carotid Findings: +----------+--------+--------+--------+------------------+------------------+           PSV cm/sEDV cm/sStenosisPlaque DescriptionComments           +----------+--------+--------+--------+------------------+------------------+ CCA Prox  73      12                                intimal thickening +----------+--------+--------+--------+------------------+------------------+ CCA Distal43      9                                 intimal thickening +----------+--------+--------+--------+------------------+------------------+ ICA Prox  54      11                                                   +----------+--------+--------+--------+------------------+------------------+ ICA Distal67      19                                                   +----------+--------+--------+--------+------------------+------------------+ ECA       53      5                                                    +----------+--------+--------+--------+------------------+------------------+ +----------+--------+--------+--------+-------------------+           PSV cm/sEDV cm/sDescribeArm Pressure (mmHG) +----------+--------+--------+--------+-------------------+ Subclavian113                                          +----------+--------+--------+--------+-------------------+ +---------+--------+--+--------+--+  VertebralPSV cm/s75EDV cm/s20 +---------+--------+--+--------+--+   Summary: Right Carotid: The extracranial vessels were near-normal with only minimal wall                thickening or plaque. Left Carotid: The extracranial vessels were near-normal with only minimal wall               thickening or plaque. Vertebrals:  Bilateral vertebral arteries demonstrate antegrade flow. Subclavians: Normal flow hemodynamics were seen in bilateral subclavian              arteries. *See table(s) above for measurements and observations.  Electronically signed by Servando Snare MD on 07/27/2019 at 12:53:06 AM.    Final    VAS [place] LOWER EXTREMITY VENOUS (DVT)  Result Date: 07/23/2019  Lower Venous DVTStudy Indications: Edema, and elevated ddimer.  Limitations: Body habitus and poor ultrasound/tissue interface. Comparison Study: no prior Performing Technologist: Abram Sander RVS  Examination Guidelines: A complete evaluation includes B-mode imaging, spectral Doppler, color Doppler, and power Doppler as needed of all accessible portions of each vessel. Bilateral testing is considered an integral part of a complete examination. Limited examinations for reoccurring indications may be performed as noted. The reflux portion of the exam is performed with the patient in reverse Trendelenburg.  +---------+---------------+---------+-----------+----------+--------------+ RIGHT    CompressibilityPhasicitySpontaneityPropertiesThrombus Aging +---------+---------------+---------+-----------+----------+--------------+ CFV      Full           Yes      Yes                                 +---------+---------------+---------+-----------+----------+--------------+ SFJ      Full                                                        +---------+---------------+---------+-----------+----------+--------------+ FV Prox  Full                                                         +---------+---------------+---------+-----------+----------+--------------+ FV Mid                  Yes      Yes                                 +---------+---------------+---------+-----------+----------+--------------+ FV Distal               Yes      Yes                                 +---------+---------------+---------+-----------+----------+--------------+ PFV      Full                                                        +---------+---------------+---------+-----------+----------+--------------+ POP      Full  Yes      Yes                                 +---------+---------------+---------+-----------+----------+--------------+ PTV      Full                                                        +---------+---------------+---------+-----------+----------+--------------+ PERO     Full                                                        +---------+---------------+---------+-----------+----------+--------------+   +---------+---------------+---------+-----------+----------+--------------+ LEFT     CompressibilityPhasicitySpontaneityPropertiesThrombus Aging +---------+---------------+---------+-----------+----------+--------------+ CFV      Full           Yes      Yes                                 +---------+---------------+---------+-----------+----------+--------------+ SFJ      Full                                                        +---------+---------------+---------+-----------+----------+--------------+ FV Prox  Full                                                        +---------+---------------+---------+-----------+----------+--------------+ FV Mid   Full                                                        +---------+---------------+---------+-----------+----------+--------------+ FV Distal               Yes      Yes                                  +---------+---------------+---------+-----------+----------+--------------+ PFV      Full                                                        +---------+---------------+---------+-----------+----------+--------------+ POP      Full           Yes      Yes                                 +---------+---------------+---------+-----------+----------+--------------+ PTV  Full                                                        +---------+---------------+---------+-----------+----------+--------------+ PERO                                                  Not visualized +---------+---------------+---------+-----------+----------+--------------+     Summary: BILATERAL: - No evidence of deep vein thrombosis seen in the lower extremities, bilaterally. -   *See table(s) above for measurements and observations. Electronically signed by Monica Martinez MD on 07/23/2019 at 5:31:24 PM.    Final    CT Angio Abd/Pel w/ and/or w/o  Result Date: 07/27/2019 CLINICAL DATA:  54 year old male with history of severe aortic stenosis. Preprocedural study prior to potential transcatheter aortic valve replacement (TAVR) procedure. EXAM: CT ANGIOGRAPHY CHEST, ABDOMEN AND PELVIS TECHNIQUE: Non-contrast CT of the chest was initially obtained. Multidetector CT imaging through the chest, abdomen and pelvis was performed using the standard protocol during bolus administration of intravenous contrast. Multiplanar reconstructed images and MIPs were obtained and reviewed to evaluate the vascular anatomy. CONTRAST:  148m OMNIPAQUE IOHEXOL 350 MG/ML SOLN COMPARISON:  No priors. FINDINGS: CTA CHEST FINDINGS Cardiovascular: Heart size is mildly enlarged. There is no significant pericardial fluid, thickening or pericardial calcification. Aortic atherosclerosis. No definite coronary artery calcifications. Severe thickening calcification of the aortic valve. Dilatation of the pulmonic trunk (3.6 cm in diameter).  Mediastinum/Lymph Nodes: No pathologically enlarged mediastinal or hilar lymph nodes. Esophagus is unremarkable in appearance. No axillary lymphadenopathy. Lungs/Pleura: Patchy but diffuse ground-glass attenuation and mild interlobular septal thickening noted throughout the lungs bilaterally, suggesting a background of mild interstitial pulmonary edema. Moderate bilateral pleural effusions lying dependently with some associated passive subsegmental atelectasis in the lower lobes of the lungs bilaterally. No definite suspicious appearing pulmonary nodules or masses. Musculoskeletal/Soft Tissues: Old healed fracture of the mid sternum with mild posttraumatic deformity. There are no aggressive appearing lytic or blastic lesions noted in the visualized portions of the skeleton. CTA ABDOMEN AND PELVIS FINDINGS Hepatobiliary: Liver has a slightly shrunken appearance and nodular contour, indicative of underlying cirrhosis. No discrete cystic or solid hepatic lesions. No intra or extrahepatic biliary ductal dilatation. Gallbladder is nearly completely decompressed, and otherwise unremarkable in appearance. Pancreas: No pancreatic mass. No pancreatic ductal dilatation. No pancreatic or peripancreatic fluid collections or inflammatory changes. Spleen: Unremarkable. Adrenals/Urinary Tract: Left kidney and bilateral adrenal glands are normal in appearance. In the lower pole of the right kidney there is an exophytic low-attenuation nonenhancing lesion measuring 8 cm in diameter, compatible with a simple cyst. No hydroureteronephrosis. Amorphous mass-like area of enhancement along the posterior wall of the urinary bladder measuring approximately 6.4 x 2.0 x 2.5 cm (axial image 214 of series 6 and sagittal image 111 of series 9). Stomach/Bowel: Appearance of the stomach is normal. No pathologic dilatation of small bowel or colon. A few scattered colonic diverticulae are noted, without definite surrounding inflammatory changes to  suggest an acute diverticulitis (study is limited by presence of small volume of ascites). Normal appendix. Vascular/Lymphatic: Aortic atherosclerosis with vascular findings and measurements pertinent to potential TAVR procedure, as detailed below. No aneurysm or dissection noted in the abdominal  or pelvic vasculature. No lymphadenopathy noted in the abdomen or pelvis. Reproductive: Prostate gland and seminal vesicles are unremarkable in appearance. Other: Small volume of ascites.  No pneumoperitoneum. Musculoskeletal: There are no aggressive appearing lytic or blastic lesions noted in the visualized portions of the skeleton. Mild diffuse body wall edema. VASCULAR MEASUREMENTS PERTINENT TO TAVR: AORTA: Minimal Aortic Diameter-16 x 16 mm Severity of Aortic Calcification-mild RIGHT PELVIS: Right Common Iliac Artery - Minimal Diameter-9.0 x 11.1 mm Tortuosity - mild Calcification-minimal Right External Iliac Artery - Minimal Diameter-9.5 x 9.1 mm Tortuosity - mild Calcification-none Right Common Femoral Artery - Minimal Diameter-6.4 x 8.6 mm Tortuosity - mild Calcification-none LEFT PELVIS: Left Common Iliac Artery - Minimal Diameter-11.6 x 9.9 mm Tortuosity - mild Calcification-minimal Left External Iliac Artery - Minimal Diameter-11.5 x 8.8 mm Tortuosity - mild Calcification-none Left Common Femoral Artery - Minimal Diameter-9.1 x 8.7 mm Tortuosity-mild Calcification-none Review of the MIP images confirms the above findings. IMPRESSION: 1. Vascular findings and measurements pertinent to potential TAVR procedure, as detailed above. 2. Severe thickening calcification of the aortic valve, compatible with reported clinical history of severe aortic stenosis. 3. Cardiomegaly with evidence of mild interstitial pulmonary edema in the lungs and moderate bilateral pleural effusions; imaging findings concerning for congestive heart failure. 4. Amorphous mass-like area of enhancement along the posterior wall of the urinary  bladder measuring approximately 6.4 x 2.0 x 2.5 cm. Nonemergent Urologic consultation is strongly recommended in the near future to better evaluate this finding, as the possibility of an infiltrative bladder neoplasm is not excluded. 5. There is also dilatation of the pulmonic trunk (3.6 cm in diameter), concerning for pulmonary arterial hypertension. 6. Morphologic changes in the liver suggesting early cirrhosis. 7. Small volume of ascites. 8. Mild diffuse body wall edema. 9. Additional incidental findings, as above. These results will be called to the ordering clinician or representative by the Radiologist Assistant, and communication documented in the PACS or [employer]. Electronically Signed   By: Vinnie Langton M.D.   On: 07/27/2019 12:47    Lala Lund M.D on 08/02/2019 at 10:33 AM   [facility] -  Office  [phone]

## 2019-08-03 LAB — CBC
HCT: 41.8 % (ref 39.0–52.0)
Hemoglobin: 13.2 g/dL (ref 13.0–17.0)
MCH: 30 pg (ref 26.0–34.0)
MCHC: 31.6 g/dL (ref 30.0–36.0)
MCV: 95 fL (ref 80.0–100.0)
Platelets: 212 10*3/uL (ref 150–400)
RBC: 4.4 MIL/uL (ref 4.22–5.81)
RDW: 17.1 % — ABNORMAL HIGH (ref 11.5–15.5)
WBC: 7.3 10*3/uL (ref 4.0–10.5)
nRBC: 0 % (ref 0.0–0.2)

## 2019-08-03 LAB — COMPREHENSIVE METABOLIC PANEL
ALT: 22 U/L (ref 0–44)
AST: 29 U/L (ref 15–41)
Albumin: 3.5 g/dL (ref 3.5–5.0)
Alkaline Phosphatase: 276 U/L — ABNORMAL HIGH (ref 38–126)
Anion gap: 12 (ref 5–15)
BUN: 17 mg/dL (ref 6–20)
CO2: 26 mmol/L (ref 22–32)
Calcium: 9.1 mg/dL (ref 8.9–10.3)
Chloride: 97 mmol/L — ABNORMAL LOW (ref 98–111)
Creatinine, Ser: 0.85 mg/dL (ref 0.61–1.24)
GFR calc Af Amer: >60 mL/min (ref >60)
GFR calc non Af Amer: >60 mL/min (ref >60)
Glucose, Bld: 116 mg/dL — ABNORMAL HIGH (ref 70–99)
Potassium: 4.3 mmol/L (ref 3.5–5.1)
Sodium: 135 mmol/L (ref 135–145)
Total Bilirubin: 2.6 mg/dL — ABNORMAL HIGH (ref 0.3–1.2)
Total Protein: 7.3 g/dL (ref 6.5–8.1)

## 2019-08-03 LAB — PROTIME-INR
INR: 1.1 (ref 0.8–1.2)
Prothrombin Time: 13.6 seconds (ref 11.4–15.2)

## 2019-08-03 LAB — MAGNESIUM: Magnesium: 2 mg/dL (ref 1.7–2.4)

## 2019-08-03 LAB — C-REACTIVE PROTEIN: CRP: 1.8 mg/dL — ABNORMAL HIGH (ref <1.0)

## 2019-08-03 NOTE — Progress Notes (Signed)
PROGRESS NOTE                                                                                                                                                                                                             Patient Demographics:    Steve Richards, is a 54 y.o. male, DOB - 1965/04/30, OIL:[id number]  Admit date - 07/21/2019   Admitting Physician Karmen Bongo, MD  Outpatient Primary MD for the patient is System, Pcp Not In  LOS - 83   Chief Complaint  Patient presents with  . Shortness of Breath  . Leg Swelling       Brief Narrative     Steve Richards is a 54 y.o. male with medical history significant of R pleural effusion in April 2021 presenting with SOB and LE edema.    Patient did not follow with PCP for last 6 years, patient with recent work-up as an outpatient and negative for CHF, volume overload with thoracentesis status post drain, as well evidence of liver cirrhosis, and recent diagnosis of left lower extremity cellulitis where he was started on Keflex, ED secondary to dyspnea, evidence of volume overload, as well he tested Covid positive on admission .  His work-up was significant for volume overload, with severe aortic stenosis, acute systolic/diastolic CHF, he has been seen by cardiology/structural cardiology team for his severe aortic stenosis, imaging was significant for questionable bladder mass, urology has been following as well.   Subjective:   Patient in bed, appears comfortable, denies any headache, no fever, no chest pain or pressure, no shortness of breath , no abdominal pain. No focal weakness.   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 he has not seen a physician for 6 to 7 years prior to admission, cardiology on board has been diuresed over 25 L since admission up to 07/31/2019, now blood pressure too low hold further diuresis until blood pressure stabilizes  and then use as needed diuretics, he has been diuresed close to 50 pounds this admission, continue UNNA Boots to assist in fluid mobilization.  Blood pressure too low to tolerate ACE, ARB or Entresto, beta-blocker overall better from pulmonary standpoint.  He was evaluated by TAVR team along with Cardiothoracic surgery for severe AS & open heart surgery  is likely to happen middle of the coming week, case discussed with cardiothoracic surgery on 07/31/2019.    Severe aortic stenosis -see above, avoid severe hypotension.   COVID-19 infection - incidental finding no treatment.   Hypotension.  Currently on midodrine and holding further diuresis.    D-dimers elevated at 6 on admission, it is contrary it is currently trending down, tinea with current DVT prophylaxis dose, his venous Doppler negative for DVT .  COVID-19 Labs  Recent Labs    08/01/19 0428 08/02/19 0500 08/03/19 0359  CRP 1.6* 1.7* 1.8*    Lab Results  Component Value Date   SARSCOV2NAA POSITIVE (A) 07/21/2019   [place] NEGATIVE 06/23/2019    Liver cirrhosis - Patient denies any history of alcohol abuse in the past, this is most likely related to cardiac cirrhosis versus NASH.  Negative hepatitis panel, PCP to monitor and arrange for outpatient GI follow-up, if blood pressure allows will add low-dose beta-blocker.   Left lower extremity cellulitis - has been treated with antibiotics this admission and will nowl monitor clinically.  Some discoloration is due to massive edema.   Questionable findings of bladder mass - Urology consult done, bedside cystoscopy on 07/29/2019 unremarkable, outpatient follow-up with urology post discharge.  Hyperglycemia  -A1c is 6.1, prediabetic PCP to monitor.  Morbid obesity - BMI 35 follow with PCP for weight loss.  Hypothyroidism -TSH elevated at 14, with low normal free T4 at 0.65, started on low-dose Synthroid, check TSH in 4 to 6 weeks.   Code Status : Full  Disposition Plan  :    Status is: Inpatient  Remains inpatient appropriate because:IV treatments appropriate due to intensity of illness or inability to take PO   Dispo: The patient is from: Home              Anticipated d/c is to: Home              Anticipated d/c date is: 3 days              Patient currently is not medically stable to d/c.  Patient still with significant volume overload, requiring further IV diuresis.  Plan for cardiac cath today, and bedside cystoscopy tomorrow.   Consults  :  Cardiology,Urology, cardiothoracic surgery  Procedures  :   Heart catheterization on 07/28/2019.     1.  Patent coronary arteries with no obstructive CAD 2.  Severe calcification and restriction of the aortic valve leaflets with hemodynamic evidence of critical aortic stenosis.  Mean aortic gradient 57 mmHg, peak gradient 108 mmHg, calculated aortic valve area 0.6 cm 3.  Elevated right and left heart pressures consistent with congestive heart failure   Bedside cystoscopy on 07/29/2019.  Unremarkable.  Bilateral leg ultrasound.  No DVT.    CTA -  1. Vascular findings and measurements pertinent to potential TAVR procedure, as detailed above. 2. Severe thickening calcification of the aortic valve, compatible with reported clinical history of severe aortic stenosis. 3. Cardiomegaly with evidence of mild interstitial pulmonary edema in the lungs and moderate bilateral pleural effusions; imaging findings concerning for congestive heart failure. 4. Amorphous mass-like area of enhancement along the posterior wall of the urinary bladder measuring approximately 6.4 x 2.0 x 2.5 cm. Nonemergent Urologic consultation is strongly recommended in the near future to better evaluate this finding, as the possibility of an infiltrative bladder neoplasm is not excluded. 5. There is also dilatation of the pulmonic trunk (3.6 cm in diameter), concerning for pulmonary arterial hypertension.  6. Morphologic changes in the liver suggesting early  cirrhosis. 7. Small volume of ascites. 8. Mild diffuse body wall edema.  TTE  1. Left ventricular ejection fraction, by estimation, is 40 to 45%. The left ventricle has mildly decreased function. The left ventricle demonstrates global hypokinesis. There is moderate left ventricular hypertrophy. Left ventricular diastolic parameters are consistent with Grade II diastolic dysfunction  (pseudonormalization).  2. Right ventricular systolic function is mildly reduced. The right ventricular size is normal. There is moderately elevated pulmonary artery  systolic pressure.  3. Left atrial size was moderately dilated.  4. The mitral valve is abnormal. Trivial mitral valve regurgitation.  5. Cannot exclude a bicuspid valve. Aortic valve regurgitation is mild. Critical aortic valve stenosis. Aortic valve area, by VTI measures 0.73  cm. Aortic valve mean gradient measures 96.2 mmHg. Peak gradient of 137 mmHg. Aortic valve Vmax measures 5.86 m/s.  6. The inferior vena cava is dilated in size with <50% respiratory variability, suggesting right atrial pressure of 15 mmHg.  7. Moderate pleural effusion in the right lateral region.     DVT Prophylaxis  :  [place] lovenox  Lab Results  Component Value Date   PLT 212 08/03/2019    Antibiotics  :   Anti-infectives (From admission, onward)   Start     Dose/Rate Route Frequency Ordered Stop   07/25/19 1530  doxycycline (VIBRA-TABS) tablet 100 mg  Status:  Discontinued     100 mg Oral Every 12 hours 07/25/19 1521 07/27/19 0809   07/22/19 1400  ceFAZolin (ANCEF) IVPB 2g/100 mL premix  Status:  Discontinued     2 g 200 mL/hr over 30 Minutes Intravenous Every 8 hours 07/22/19 1223 07/25/19 1521   07/22/19 0000  vancomycin (VANCOREADY) IVPB 1250 mg/250 mL  Status:  Discontinued     1,250 mg 166.7 mL/hr over 90 Minutes Intravenous Every 12 hours 07/21/19 1141 07/22/19 1223   07/21/19 1145  vancomycin (VANCOREADY) IVPB 2000 mg/400 mL     2,000 mg 200  mL/hr over 120 Minutes Intravenous  Once 07/21/19 1141 07/21/19 1509        Objective:   Vitals:   08/02/19 0840 08/02/19 1617 08/02/19 2047 08/03/19 0446  BP: 92/75 103/73 97/66 93/74   Pulse: 79 75 80 71  Resp: 17 17 18 18   Temp: 98.2 F (36.8 C) 98.1 F (36.7 C) 98.2 F (36.8 C) 97.7 F (36.5 C)  TempSrc: Oral Oral Oral Oral  SpO2: (!) 89% 92% 94% 96%  Weight:    118.5 kg  Height:        Wt Readings from Last 3 Encounters:  08/03/19 118.5 kg  09/24/11 125.2 kg     Intake/Output Summary (Last 24 hours) at 08/03/2019 1020 Last data filed at 08/03/2019 0901 Gross per 24 hour  Intake 440 ml  Output 1900 ml  Net -1460 ml     Physical Exam  Awake Alert, No new F.N deficits, Normal affect .AT,PERRAL Supple Neck,No JVD, No cervical lymphadenopathy appriciated.  Symmetrical Chest wall movement, Good air movement bilaterally, CTAB RRR,No Gallops, positive aortic systolic murmur 2/5, No Parasternal Heave +ve B.Sounds, Abd Soft, No tenderness, No organomegaly appriciated, No rebound - guarding or rigidity. 2+ leg edema L>R, Unna boots placed.    Data Review:    CBC Recent Labs  Lab 07/28/19 0431 07/28/19 1255 07/30/19 0250 07/31/19 0352 08/01/19 0428 08/02/19 0500 08/03/19 0359  WBC 8.3   < > 8.3 8.3 7.7 7.4 7.3  HGB 13.5   < >  13.5 14.0 12.8* 12.8* 13.2  HCT 42.7   < > 42.1 44.6 40.4 40.7 41.8  PLT 204   < > 205 236 214 212 212  MCV 93.6   < > 93.8 95.5 94.4 95.8 95.0  MCH 29.6   < > 30.1 30.0 29.9 30.1 30.0  MCHC 31.6   < > 32.1 31.4 31.7 31.4 31.6  RDW 16.4*   < > 16.9* 17.0* 17.0* 17.0* 17.1*  LYMPHSABS 1.1  --   --   --   --   --   --   MONOABS 0.6  --   --   --   --   --   --   EOSABS 0.1  --   --   --   --   --   --   BASOSABS 0.1  --   --   --   --   --   --    < > = values in this interval not displayed.    Chemistries  Recent Labs  Lab 07/28/19 0431 07/28/19 1255 07/30/19 0250 07/31/19 0352 08/01/19 0428 08/02/19 0500 08/03/19 0359    NA 136   < > 135 137 136 134* 135  K 4.2   < > 4.3 4.3 4.4 4.0 4.3  CL 94*   < > 95* 94* 97* 97* 97*  CO2 32   < > 31 34* 31 29 26   GLUCOSE 119*   < > 128* 129* 125* 118* 116*  BUN 19   < > 19 18 18 18 17   CREATININE 0.96   < > 0.99 0.98 0.99 0.90 0.85  CALCIUM 8.9   < > 8.9 9.4 8.9 8.8* 9.1  MG 1.9  --  1.9 2.1 2.0 1.9 2.0  AST 35  --   --   --   --  30 29  ALT 20  --   --   --   --  21 22  ALKPHOS 273*  --   --   --   --  271* 276*  BILITOT 2.8*  --   --   --   --  2.6* 2.6*   < > = values in this interval not displayed.   ------------------------------------------------------------------------------------------------------------------ No results for input(s): CHOL, HDL, LDLCALC, TRIG, CHOLHDL, LDLDIRECT in the last 72 hours.  Lab Results  Component Value Date   HGBA1C 6.1 (H) 07/21/2019   ------------------------------------------------------------------------------------------------------------------ No results for input(s): TSH, T4TOTAL, T3FREE, THYROIDAB in the last 72 hours.  Invalid input(s): FREET3 ------------------------------------------------------------------------------------------------------------------ No results for input(s): VITAMINB12, FOLATE, FERRITIN, TIBC, IRON, RETICCTPCT in the last 72 hours.  Coagulation profile Recent Labs  Lab 08/03/19 0359  INR 1.1    No results for input(s): DDIMER in the last 72 hours.  Cardiac Enzymes No results for input(s): CKMB, TROPONINI, MYOGLOBIN in the last 168 hours.  Invalid input(s): CK ------------------------------------------------------------------------------------------------------------------    Component Value Date/Time   BNP 1,142.6 (H) 07/21/2019 1316    Inpatient Medications  Scheduled Meds: . aspirin EC  81 mg Oral Daily  . docusate sodium  100 mg Oral BID  . enoxaparin (LOVENOX) injection  60 mg Subcutaneous Q24H  . feeding supplement (ENSURE ENLIVE)  237 mL Oral Q1500  . feeding  supplement (PRO-STAT SUGAR FREE 64)  30 mL Oral Daily  . levothyroxine  25 mcg Oral Q0600  . midodrine  5 mg Oral TID WC  . multivitamin with minerals  1 tablet Oral Daily  . saccharomyces boulardii  250 mg Oral BID  . sodium chloride flush  10-40 mL Intracatheter Q12H   Continuous Infusions:  PRN Meds:.acetaminophen **OR** [DISCONTINUED] acetaminophen, bisacodyl, hydrALAZINE, HYDROcodone-acetaminophen, [DISCONTINUED] ondansetron **OR** ondansetron (ZOFRAN) IV, polyethylene glycol  Micro Results No results found for this or any previous visit (from the past 240 hour(s)).  Radiology Reports DG Chest 2 View  Result Date: 07/21/2019 CLINICAL DATA:  Chest pain and shortness of breath EXAM: CHEST - 2 VIEW COMPARISON:  06/25/2018 FINDINGS: Congested appearance of vessels with small pleural effusions. Cardiomegaly that is chronic. Negative aortic contours. IMPRESSION: Cardiomegaly with vascular congestion and small pleural effusions. Electronically Signed   By: Monte Fantasia M.D.   On: 07/21/2019 08:42   CARDIAC CATHETERIZATION  Result Date: 07/28/2019  There is severe aortic valve stenosis.  1.  Patent coronary arteries with no obstructive CAD 2.  Severe calcification and restriction of the aortic valve leaflets with hemodynamic evidence of critical aortic stenosis.  Mean aortic gradient 57 mmHg, peak gradient 108 mmHg, calculated aortic valve area 0.6 cm 3.  Elevated right and left heart pressures consistent with congestive heart failure Recommendations: Continued heart team evaluation for treatment options of critical aortic stenosis.  Continue IV diuresis as tolerated.  We will ask for inpatient cardiac surgical evaluation to help determine treatment plan.   CT CORONARY MORPH W/CTA COR W/SCORE W/CA W/CM &/OR WO/CM  Addendum Date: 07/27/2019   ADDENDUM REPORT: 07/27/2019 12:22 ADDENDUM: Extracardiac findings are described separately under dictation for contemporaneously obtained CTA chest,  abdomen and pelvis performed on 07/25/2019. Please see dictation for that examination for description of relevant extracardiac findings. Electronically Signed   By: Vinnie Langton M.D.   On: 07/27/2019 12:22   Result Date: 07/27/2019 CLINICAL DATA:  54 year old male with severe aortic stenosis being evaluated for a TAVR procedure. EXAM: Cardiac TAVR CT TECHNIQUE: The patient was scanned on a [employer]. A 120 kV retrospective scan was triggered in the descending thoracic aorta at 111 HU's. Gantry rotation speed was 250 msecs and collimation was .6 mm. No beta blockade or nitro were given. The 3D data set was reconstructed in 5% intervals of the R-R cycle. Systolic and diastolic phases were analyzed on a dedicated work station using MPR, MIP and VRT modes. The patient received 80 cc of contrast. FINDINGS: Aortic Valve: Trileaflet aortic valve with severely calcified and thickened leaflets and only mild calcifications extending into the LVOT. Aorta: Normal size, minimal plaque, no calcifications, no dissection. Sinotubular Junction: 30 x 29 mm Ascending Thoracic Aorta: 35 x 34 mm Aortic Arch: Not visualized. Descending Thoracic Aorta: 22 x 22 mm Sinus of Valsalva Measurements: Non-coronary: 34 mm Right -coronary: 31 mm Left -coronary: 35 mm Coronary Artery Height above Annulus: Left Main: 13.1 mm Right Coronary: 18.2 mm Virtual Basal Annulus Measurements: Maximum/Minimum Diameter: 34.0 x 25.7 mm Mean Diameter: 28.2 mm Perimeter: 94.1 mm Area: 626 mm2 Coronary Arteries: Calcium score is 0. Coronary Arteries:  Normal coronary origin.  Right dominance. RCA is a large dominant artery that gives rise to PDA and PLA. There is no plaque. Left main is a large artery that gives rise to LAD and LCX arteries. Left main has no plaque. LAD is a large vessel that has no plaque. LCX is a non-dominant artery that gives rise to one large OM1 branch. There is no plaque. Optimum Fluoroscopic Angle for Delivery: RAO 0 CRA  0 IMPRESSION: 1. Trileaflet aortic valve with severely calcified and thickened leaflets and only mild calcifications extending into  the LVOT. Aortic valve calcium score 7968 consistent with severe aortic stenosis. Annular measurements suitable for delivery of a 29 mm Edwards-SAPIEN 3 valve. 2. Sufficient coronary to annulus distance. 3. Optimum Fluoroscopic Angle for Delivery:  RAO 0 CRA 0. 4. No thrombus in the left atrial appendage. 5. Coronary Arteries: Normal origin. Right dominance. Calcium score is 0. There are only minimal irregularities. 6. Dilated pulmonary artery measuring 35 suggestive of pulmonary hypertension. Electronically Signed: By: Ena Dawley On: 07/26/2019 13:01   CT ANGIO CHEST AORTA W/CM & OR WO/CM  Result Date: 07/27/2019 CLINICAL DATA:  54 year old male with history of severe aortic stenosis. Preprocedural study prior to potential transcatheter aortic valve replacement (TAVR) procedure. EXAM: CT ANGIOGRAPHY CHEST, ABDOMEN AND PELVIS TECHNIQUE: Non-contrast CT of the chest was initially obtained. Multidetector CT imaging through the chest, abdomen and pelvis was performed using the standard protocol during bolus administration of intravenous contrast. Multiplanar reconstructed images and MIPs were obtained and reviewed to evaluate the vascular anatomy. CONTRAST:  164m OMNIPAQUE IOHEXOL 350 MG/ML SOLN COMPARISON:  No priors. FINDINGS: CTA CHEST FINDINGS Cardiovascular: Heart size is mildly enlarged. There is no significant pericardial fluid, thickening or pericardial calcification. Aortic atherosclerosis. No definite coronary artery calcifications. Severe thickening calcification of the aortic valve. Dilatation of the pulmonic trunk (3.6 cm in diameter). Mediastinum/Lymph Nodes: No pathologically enlarged mediastinal or hilar lymph nodes. Esophagus is unremarkable in appearance. No axillary lymphadenopathy. Lungs/Pleura: Patchy but diffuse ground-glass attenuation and mild  interlobular septal thickening noted throughout the lungs bilaterally, suggesting a background of mild interstitial pulmonary edema. Moderate bilateral pleural effusions lying dependently with some associated passive subsegmental atelectasis in the lower lobes of the lungs bilaterally. No definite suspicious appearing pulmonary nodules or masses. Musculoskeletal/Soft Tissues: Old healed fracture of the mid sternum with mild posttraumatic deformity. There are no aggressive appearing lytic or blastic lesions noted in the visualized portions of the skeleton. CTA ABDOMEN AND PELVIS FINDINGS Hepatobiliary: Liver has a slightly shrunken appearance and nodular contour, indicative of underlying cirrhosis. No discrete cystic or solid hepatic lesions. No intra or extrahepatic biliary ductal dilatation. Gallbladder is nearly completely decompressed, and otherwise unremarkable in appearance. Pancreas: No pancreatic mass. No pancreatic ductal dilatation. No pancreatic or peripancreatic fluid collections or inflammatory changes. Spleen: Unremarkable. Adrenals/Urinary Tract: Left kidney and bilateral adrenal glands are normal in appearance. In the lower pole of the right kidney there is an exophytic low-attenuation nonenhancing lesion measuring 8 cm in diameter, compatible with a simple cyst. No hydroureteronephrosis. Amorphous mass-like area of enhancement along the posterior wall of the urinary bladder measuring approximately 6.4 x 2.0 x 2.5 cm (axial image 214 of series 6 and sagittal image 111 of series 9). Stomach/Bowel: Appearance of the stomach is normal. No pathologic dilatation of small bowel or colon. A few scattered colonic diverticulae are noted, without definite surrounding inflammatory changes to suggest an acute diverticulitis (study is limited by presence of small volume of ascites). Normal appendix. Vascular/Lymphatic: Aortic atherosclerosis with vascular findings and measurements pertinent to potential TAVR  procedure, as detailed below. No aneurysm or dissection noted in the abdominal or pelvic vasculature. No lymphadenopathy noted in the abdomen or pelvis. Reproductive: Prostate gland and seminal vesicles are unremarkable in appearance. Other: Small volume of ascites.  No pneumoperitoneum. Musculoskeletal: There are no aggressive appearing lytic or blastic lesions noted in the visualized portions of the skeleton. Mild diffuse body wall edema. VASCULAR MEASUREMENTS PERTINENT TO TAVR: AORTA: Minimal Aortic Diameter-16 x 16 mm Severity of Aortic Calcification-mild RIGHT  PELVIS: Right Common Iliac Artery - Minimal Diameter-9.0 x 11.1 mm Tortuosity - mild Calcification-minimal Right External Iliac Artery - Minimal Diameter-9.5 x 9.1 mm Tortuosity - mild Calcification-none Right Common Femoral Artery - Minimal Diameter-6.4 x 8.6 mm Tortuosity - mild Calcification-none LEFT PELVIS: Left Common Iliac Artery - Minimal Diameter-11.6 x 9.9 mm Tortuosity - mild Calcification-minimal Left External Iliac Artery - Minimal Diameter-11.5 x 8.8 mm Tortuosity - mild Calcification-none Left Common Femoral Artery - Minimal Diameter-9.1 x 8.7 mm Tortuosity-mild Calcification-none Review of the MIP images confirms the above findings. IMPRESSION: 1. Vascular findings and measurements pertinent to potential TAVR procedure, as detailed above. 2. Severe thickening calcification of the aortic valve, compatible with reported clinical history of severe aortic stenosis. 3. Cardiomegaly with evidence of mild interstitial pulmonary edema in the lungs and moderate bilateral pleural effusions; imaging findings concerning for congestive heart failure. 4. Amorphous mass-like area of enhancement along the posterior wall of the urinary bladder measuring approximately 6.4 x 2.0 x 2.5 cm. Nonemergent Urologic consultation is strongly recommended in the near future to better evaluate this finding, as the possibility of an infiltrative bladder neoplasm is not  excluded. 5. There is also dilatation of the pulmonic trunk (3.6 cm in diameter), concerning for pulmonary arterial hypertension. 6. Morphologic changes in the liver suggesting early cirrhosis. 7. Small volume of ascites. 8. Mild diffuse body wall edema. 9. Additional incidental findings, as above. These results will be called to the ordering clinician or representative by the Radiologist Assistant, and communication documented in the PACS or [employer]. Electronically Signed   By: Vinnie Langton M.D.   On: 07/27/2019 12:47   ECHOCARDIOGRAM COMPLETE  Result Date: 07/21/2019    ECHOCARDIOGRAM REPORT   Patient Name:   SHANARD TRETO Date of Exam: 07/21/2019 Medical Rec #:  [id number]   Height:       72.0 in Accession #:    [id number]  Weight:       310.0 lb Date of Birth:  01/31/1966   BSA:          2.567 m Patient Age:    20 years    BP:           102/73 mmHg Patient Gender: M           HR:           73 bpm. Exam Location:  Inpatient Procedure: 2D Echo Indications:    CHF-Acute Systolic 725.36 / U44.03  History:        Patient has no prior history of Echocardiogram examinations.                 Risk Factors:Obesity. Pleural effusion on right.  Sonographer:    Vikki Ports Turrentine Referring Phys: [place]  1. Left ventricular ejection fraction, by estimation, is 40 to 45%. The left ventricle has mildly decreased function. The left ventricle demonstrates global hypokinesis. There is moderate left ventricular hypertrophy. Left ventricular diastolic parameters are consistent with Grade II diastolic dysfunction (pseudonormalization).  2. Right ventricular systolic function is mildly reduced. The right ventricular size is normal. There is moderately elevated pulmonary artery systolic pressure.  3. Left atrial size was moderately dilated.  4. The mitral valve is abnormal. Trivial mitral valve regurgitation.  5. Cannot exclude a bicuspid valve. Aortic valve regurgitation is mild. Critical aortic  valve stenosis. Aortic valve area, by VTI measures 0.73 cm. Aortic valve mean gradient measures 96.2 mmHg. Peak gradient of 137 mmHg. Aortic valve Vmax measures 5.86  m/s.  6. The inferior vena cava is dilated in size with <50% respiratory variability, suggesting right atrial pressure of 15 mmHg.  7. Moderate pleural effusion in the right lateral region. FINDINGS  Left Ventricle: Left ventricular ejection fraction, by estimation, is 40 to 45%. The left ventricle has mildly decreased function. The left ventricle demonstrates global hypokinesis. The left ventricular internal cavity size was normal in size. There is  moderate left ventricular hypertrophy. Left ventricular diastolic parameters are consistent with Grade II diastolic dysfunction (pseudonormalization). Indeterminate filling pressures. Right Ventricle: The right ventricular size is normal. No increase in right ventricular wall thickness. Right ventricular systolic function is mildly reduced. There is moderately elevated pulmonary artery systolic pressure. The tricuspid regurgitant velocity is 3.35 m/s, and with an assumed right atrial pressure of 15 mmHg, the estimated right ventricular systolic pressure is 41.9 mmHg. Left Atrium: Left atrial size was moderately dilated. Right Atrium: Right atrial size was normal in size. Pericardium: There is no evidence of pericardial effusion. Mitral Valve: The mitral valve is abnormal. Mild to moderate mitral annular calcification. Trivial mitral valve regurgitation. Tricuspid Valve: The tricuspid valve is grossly normal. Tricuspid valve regurgitation is mild. Aortic Valve: The aortic valve is tricuspid. . There is moderate thickening and severe calcifcation of the aortic valve. Aortic valve regurgitation is mild. Aortic regurgitation PHT measures 374 msec. Severe aortic stenosis is present. There is moderate thickening of the aortic valve. There is severe calcifcation of the aortic valve. Aortic valve mean gradient  measures 96.2 mmHg. Aortic valve peak gradient measures 137.3 mmHg. Aortic valve area, by VTI measures 0.73 cm. Pulmonic Valve: The pulmonic valve was normal in structure. Pulmonic valve regurgitation is not visualized. Aorta: The aortic root and ascending aorta are structurally normal, with no evidence of dilitation. Venous: The inferior vena cava is dilated in size with less than 50% respiratory variability, suggesting right atrial pressure of 15 mmHg. IAS/Shunts: No atrial level shunt detected by color flow Doppler. Additional Comments: There is a moderate pleural effusion in the right lateral region.  LEFT VENTRICLE PLAX 2D LVIDd:         5.50 cm  Diastology LVIDs:         4.40 cm  LV e' lateral:   9.23 cm/s LV PW:         1.40 cm  LV E/e' lateral: 10.2 LV IVS:        1.40 cm  LV e' medial:    7.05 cm/s LVOT diam:     2.50 cm  LV E/e' medial:  13.4 LV SV:         108 LV SV Index:   42 LVOT Area:     4.91 cm  RIGHT VENTRICLE RV S prime:     7.20 cm/s TAPSE (M-mode): 1.2 cm LEFT ATRIUM              Index       RIGHT ATRIUM           Index LA diam:        5.90 cm  2.30 cm/m  RA Area:     22.60 cm LA Vol (A2C):   121.0 ml 47.14 ml/m RA Volume:   68.80 ml  26.80 ml/m LA Vol (A4C):   95.5 ml  37.21 ml/m LA Biplane Vol: 110.0 ml 42.86 ml/m  AORTIC VALVE AV Area (Vmax):    0.82 cm AV Area (Vmean):   0.72 cm AV Area (VTI):     0.73 cm  AV Vmax:           585.80 cm/s AV Vmean:          474.000 cm/s AV VTI:            1.480 m AV Peak Grad:      137.3 mmHg AV Mean Grad:      96.2 mmHg LVOT Vmax:         97.30 cm/s LVOT Vmean:        69.800 cm/s LVOT VTI:          0.220 m LVOT/AV VTI ratio: 0.15 AI PHT:            374 msec  AORTA Ao Root diam: 3.00 cm MITRAL VALVE               TRICUSPID VALVE MV Area (PHT): 5.54 cm    TR Peak grad:   44.9 mmHg MV Decel Time: 137 msec    TR Vmax:        335.00 cm/s MV E velocity: 94.30 cm/s MV A velocity: 46.30 cm/s  SHUNTS MV E/A ratio:  2.04        Systemic VTI:  0.22 m                             Systemic Diam: 2.50 cm Lyman Bishop MD Electronically signed by Lyman Bishop MD Signature Date/Time: 07/21/2019/3:42:23 PM    Final    VAS US CAROTID  Result Date: 07/27/2019 Carotid Arterial Duplex Study Indications:       Covid-19. Other Factors:     Critical Aortic Stenosis, CHF, Morbid Obesity. Limitations        Today's exam was limited due to the body habitus of the                    patient. Comparison Study:  No prior study on file for comparison Performing Technologist: Sharion Dove RVS  Examination Guidelines: A complete evaluation includes B-mode imaging, spectral Doppler, color Doppler, and power Doppler as needed of all accessible portions of each vessel. Bilateral testing is considered an integral part of a complete examination. Limited examinations for reoccurring indications may be performed as noted.  Right Carotid Findings: +----------+--------+--------+--------+------------------+------------------+           PSV cm/sEDV cm/sStenosisPlaque DescriptionComments           +----------+--------+--------+--------+------------------+------------------+ CCA Prox  41      7                                 intimal thickening +----------+--------+--------+--------+------------------+------------------+ CCA Distal51      12                                intimal thickening +----------+--------+--------+--------+------------------+------------------+ ICA Prox  42      12              heterogenous                         +----------+--------+--------+--------+------------------+------------------+ ICA Distal48      15                                                   +----------+--------+--------+--------+------------------+------------------+  ECA       62      10                                                   +----------+--------+--------+--------+------------------+------------------+  +----------+--------+-------+--------+-------------------+           PSV cm/sEDV cmsDescribeArm Pressure (mmHG) +----------+--------+-------+--------+-------------------+ [id number]                                         +----------+--------+-------+--------+-------------------+ +---------+--------+--+--------+-+ VertebralPSV cm/s28EDV cm/s9 +---------+--------+--+--------+-+  Left Carotid Findings: +----------+--------+--------+--------+------------------+------------------+           PSV cm/sEDV cm/sStenosisPlaque DescriptionComments           +----------+--------+--------+--------+------------------+------------------+ CCA Prox  73      12                                intimal thickening +----------+--------+--------+--------+------------------+------------------+ CCA Distal43      9                                 intimal thickening +----------+--------+--------+--------+------------------+------------------+ ICA Prox  54      11                                                   +----------+--------+--------+--------+------------------+------------------+ ICA Distal67      19                                                   +----------+--------+--------+--------+------------------+------------------+ ECA       53      5                                                    +----------+--------+--------+--------+------------------+------------------+ +----------+--------+--------+--------+-------------------+           PSV cm/sEDV cm/sDescribeArm Pressure (mmHG) +----------+--------+--------+--------+-------------------+ Subclavian113                                         +----------+--------+--------+--------+-------------------+ +---------+--------+--+--------+--+ VertebralPSV cm/s75EDV cm/s20 +---------+--------+--+--------+--+   Summary: Right Carotid: The extracranial vessels were near-normal with only minimal wall                 thickening or plaque. Left Carotid: The extracranial vessels were near-normal with only minimal wall               thickening or plaque. Vertebrals:  Bilateral vertebral arteries demonstrate antegrade flow. Subclavians: Normal flow hemodynamics were seen in bilateral subclavian              arteries. *See table(s) above for measurements and observations.  Electronically signed by  Servando Snare MD on 07/27/2019 at 12:53:06 AM.    Final    VAS [place] LOWER EXTREMITY VENOUS (DVT)  Result Date: 07/23/2019  Lower Venous DVTStudy Indications: Edema, and elevated ddimer.  Limitations: Body habitus and poor ultrasound/tissue interface. Comparison Study: no prior Performing Technologist: Abram Sander RVS  Examination Guidelines: A complete evaluation includes B-mode imaging, spectral Doppler, color Doppler, and power Doppler as needed of all accessible portions of each vessel. Bilateral testing is considered an integral part of a complete examination. Limited examinations for reoccurring indications may be performed as noted. The reflux portion of the exam is performed with the patient in reverse Trendelenburg.  +---------+---------------+---------+-----------+----------+--------------+ RIGHT    CompressibilityPhasicitySpontaneityPropertiesThrombus Aging +---------+---------------+---------+-----------+----------+--------------+ CFV      Full           Yes      Yes                                 +---------+---------------+---------+-----------+----------+--------------+ SFJ      Full                                                        +---------+---------------+---------+-----------+----------+--------------+ FV Prox  Full                                                        +---------+---------------+---------+-----------+----------+--------------+ FV Mid                  Yes      Yes                                  +---------+---------------+---------+-----------+----------+--------------+ FV Distal               Yes      Yes                                 +---------+---------------+---------+-----------+----------+--------------+ PFV      Full                                                        +---------+---------------+---------+-----------+----------+--------------+ POP      Full           Yes      Yes                                 +---------+---------------+---------+-----------+----------+--------------+ PTV      Full                                                        +---------+---------------+---------+-----------+----------+--------------+ PERO  Full                                                        +---------+---------------+---------+-----------+----------+--------------+   +---------+---------------+---------+-----------+----------+--------------+ LEFT     CompressibilityPhasicitySpontaneityPropertiesThrombus Aging +---------+---------------+---------+-----------+----------+--------------+ CFV      Full           Yes      Yes                                 +---------+---------------+---------+-----------+----------+--------------+ SFJ      Full                                                        +---------+---------------+---------+-----------+----------+--------------+ FV Prox  Full                                                        +---------+---------------+---------+-----------+----------+--------------+ FV Mid   Full                                                        +---------+---------------+---------+-----------+----------+--------------+ FV Distal               Yes      Yes                                 +---------+---------------+---------+-----------+----------+--------------+ PFV      Full                                                         +---------+---------------+---------+-----------+----------+--------------+ POP      Full           Yes      Yes                                 +---------+---------------+---------+-----------+----------+--------------+ PTV      Full                                                        +---------+---------------+---------+-----------+----------+--------------+ PERO                                                  Not  visualized +---------+---------------+---------+-----------+----------+--------------+     Summary: BILATERAL: - No evidence of deep vein thrombosis seen in the lower extremities, bilaterally. -   *See table(s) above for measurements and observations. Electronically signed by Monica Martinez MD on 07/23/2019 at 5:31:24 PM.    Final    CT Angio Abd/Pel w/ and/or w/o  Result Date: 07/27/2019 CLINICAL DATA:  54 year old male with history of severe aortic stenosis. Preprocedural study prior to potential transcatheter aortic valve replacement (TAVR) procedure. EXAM: CT ANGIOGRAPHY CHEST, ABDOMEN AND PELVIS TECHNIQUE: Non-contrast CT of the chest was initially obtained. Multidetector CT imaging through the chest, abdomen and pelvis was performed using the standard protocol during bolus administration of intravenous contrast. Multiplanar reconstructed images and MIPs were obtained and reviewed to evaluate the vascular anatomy. CONTRAST:  152m OMNIPAQUE IOHEXOL 350 MG/ML SOLN COMPARISON:  No priors. FINDINGS: CTA CHEST FINDINGS Cardiovascular: Heart size is mildly enlarged. There is no significant pericardial fluid, thickening or pericardial calcification. Aortic atherosclerosis. No definite coronary artery calcifications. Severe thickening calcification of the aortic valve. Dilatation of the pulmonic trunk (3.6 cm in diameter). Mediastinum/Lymph Nodes: No pathologically enlarged mediastinal or hilar lymph nodes. Esophagus is unremarkable in appearance. No axillary lymphadenopathy.  Lungs/Pleura: Patchy but diffuse ground-glass attenuation and mild interlobular septal thickening noted throughout the lungs bilaterally, suggesting a background of mild interstitial pulmonary edema. Moderate bilateral pleural effusions lying dependently with some associated passive subsegmental atelectasis in the lower lobes of the lungs bilaterally. No definite suspicious appearing pulmonary nodules or masses. Musculoskeletal/Soft Tissues: Old healed fracture of the mid sternum with mild posttraumatic deformity. There are no aggressive appearing lytic or blastic lesions noted in the visualized portions of the skeleton. CTA ABDOMEN AND PELVIS FINDINGS Hepatobiliary: Liver has a slightly shrunken appearance and nodular contour, indicative of underlying cirrhosis. No discrete cystic or solid hepatic lesions. No intra or extrahepatic biliary ductal dilatation. Gallbladder is nearly completely decompressed, and otherwise unremarkable in appearance. Pancreas: No pancreatic mass. No pancreatic ductal dilatation. No pancreatic or peripancreatic fluid collections or inflammatory changes. Spleen: Unremarkable. Adrenals/Urinary Tract: Left kidney and bilateral adrenal glands are normal in appearance. In the lower pole of the right kidney there is an exophytic low-attenuation nonenhancing lesion measuring 8 cm in diameter, compatible with a simple cyst. No hydroureteronephrosis. Amorphous mass-like area of enhancement along the posterior wall of the urinary bladder measuring approximately 6.4 x 2.0 x 2.5 cm (axial image 214 of series 6 and sagittal image 111 of series 9). Stomach/Bowel: Appearance of the stomach is normal. No pathologic dilatation of small bowel or colon. A few scattered colonic diverticulae are noted, without definite surrounding inflammatory changes to suggest an acute diverticulitis (study is limited by presence of small volume of ascites). Normal appendix. Vascular/Lymphatic: Aortic atherosclerosis with  vascular findings and measurements pertinent to potential TAVR procedure, as detailed below. No aneurysm or dissection noted in the abdominal or pelvic vasculature. No lymphadenopathy noted in the abdomen or pelvis. Reproductive: Prostate gland and seminal vesicles are unremarkable in appearance. Other: Small volume of ascites.  No pneumoperitoneum. Musculoskeletal: There are no aggressive appearing lytic or blastic lesions noted in the visualized portions of the skeleton. Mild diffuse body wall edema. VASCULAR MEASUREMENTS PERTINENT TO TAVR: AORTA: Minimal Aortic Diameter-16 x 16 mm Severity of Aortic Calcification-mild RIGHT PELVIS: Right Common Iliac Artery - Minimal Diameter-9.0 x 11.1 mm Tortuosity - mild Calcification-minimal Right External Iliac Artery - Minimal Diameter-9.5 x 9.1 mm Tortuosity - mild Calcification-none Right Common Femoral Artery - Minimal Diameter-6.4 x 8.6 mm  Tortuosity - mild Calcification-none LEFT PELVIS: Left Common Iliac Artery - Minimal Diameter-11.6 x 9.9 mm Tortuosity - mild Calcification-minimal Left External Iliac Artery - Minimal Diameter-11.5 x 8.8 mm Tortuosity - mild Calcification-none Left Common Femoral Artery - Minimal Diameter-9.1 x 8.7 mm Tortuosity-mild Calcification-none Review of the MIP images confirms the above findings. IMPRESSION: 1. Vascular findings and measurements pertinent to potential TAVR procedure, as detailed above. 2. Severe thickening calcification of the aortic valve, compatible with reported clinical history of severe aortic stenosis. 3. Cardiomegaly with evidence of mild interstitial pulmonary edema in the lungs and moderate bilateral pleural effusions; imaging findings concerning for congestive heart failure. 4. Amorphous mass-like area of enhancement along the posterior wall of the urinary bladder measuring approximately 6.4 x 2.0 x 2.5 cm. Nonemergent Urologic consultation is strongly recommended in the near future to better evaluate this finding,  as the possibility of an infiltrative bladder neoplasm is not excluded. 5. There is also dilatation of the pulmonic trunk (3.6 cm in diameter), concerning for pulmonary arterial hypertension. 6. Morphologic changes in the liver suggesting early cirrhosis. 7. Small volume of ascites. 8. Mild diffuse body wall edema. 9. Additional incidental findings, as above. These results will be called to the ordering clinician or representative by the Radiologist Assistant, and communication documented in the PACS or [employer]. Electronically Signed   By: Vinnie Langton M.D.   On: 07/27/2019 12:47    Lala Lund M.D on 08/03/2019 at 10:20 AM   [facility] -  Office  [phone]

## 2019-08-04 ENCOUNTER — Inpatient Hospital Stay (HOSPITAL_COMMUNITY): Payer: 59

## 2019-08-04 DIAGNOSIS — Z01810 Encounter for preprocedural cardiovascular examination: Secondary | ICD-10-CM

## 2019-08-04 DIAGNOSIS — I350 Nonrheumatic aortic (valve) stenosis: Secondary | ICD-10-CM

## 2019-08-04 LAB — MAGNESIUM: Magnesium: 1.9 mg/dL (ref 1.7–2.4)

## 2019-08-04 LAB — COMPREHENSIVE METABOLIC PANEL
ALT: 21 U/L (ref 0–44)
AST: 30 U/L (ref 15–41)
Albumin: 3.4 g/dL — ABNORMAL LOW (ref 3.5–5.0)
Alkaline Phosphatase: 263 U/L — ABNORMAL HIGH (ref 38–126)
Anion gap: 8 (ref 5–15)
BUN: 16 mg/dL (ref 6–20)
CO2: 28 mmol/L (ref 22–32)
Calcium: 8.9 mg/dL (ref 8.9–10.3)
Chloride: 99 mmol/L (ref 98–111)
Creatinine, Ser: 0.87 mg/dL (ref 0.61–1.24)
GFR calc Af Amer: >60 mL/min (ref >60)
GFR calc non Af Amer: >60 mL/min (ref >60)
Glucose, Bld: 122 mg/dL — ABNORMAL HIGH (ref 70–99)
Potassium: 4.4 mmol/L (ref 3.5–5.1)
Sodium: 135 mmol/L (ref 135–145)
Total Bilirubin: 2.8 mg/dL — ABNORMAL HIGH (ref 0.3–1.2)
Total Protein: 7.3 g/dL (ref 6.5–8.1)

## 2019-08-04 LAB — CBC
HCT: 41.3 % (ref 39.0–52.0)
Hemoglobin: 13 g/dL (ref 13.0–17.0)
MCH: 30.2 pg (ref 26.0–34.0)
MCHC: 31.5 g/dL (ref 30.0–36.0)
MCV: 95.8 fL (ref 80.0–100.0)
Platelets: 204 10*3/uL (ref 150–400)
RBC: 4.31 MIL/uL (ref 4.22–5.81)
RDW: 16.9 % — ABNORMAL HIGH (ref 11.5–15.5)
WBC: 7.3 10*3/uL (ref 4.0–10.5)
nRBC: 0 % (ref 0.0–0.2)

## 2019-08-04 NOTE — Progress Notes (Signed)
[id number] Called in room but no answer. Pt probably in testing. Took [facility] booklet, staying in the tube handout and wrote on front of booklet how to view pre op video. Requested by secure chat to have RN give pt IS if he does not already have one, and these materials that were given to Network engineer. Pt has been walking and working with PT. Graylon Good RN BSN 08/04/2019 2:49 PM

## 2019-08-04 NOTE — Progress Notes (Signed)
PROGRESS NOTE                                                                                                                                                                                                             Patient Demographics:    Fredric Slabach, is a 54 y.o. male, DOB - 06-03-1965, FHL:[id number]  Admit date - 07/21/2019   Admitting Physician Karmen Bongo, MD  Outpatient Primary MD for the patient is System, Pcp Not In  LOS - 23   Chief Complaint  Patient presents with  . Shortness of Breath  . Leg Swelling       Brief Narrative     Byford Schools is a 54 y.o. male with medical history significant of R pleural effusion in April 2021 presenting with SOB and LE edema.    Patient did not follow with PCP for last 6 years, patient with recent work-up as an outpatient and negative for CHF, volume overload with thoracentesis status post drain, as well evidence of liver cirrhosis, and recent diagnosis of left lower extremity cellulitis where he was started on Keflex, ED secondary to dyspnea, evidence of volume overload, as well he tested Covid positive on admission .  His work-up was significant for volume overload, with severe aortic stenosis, acute systolic/diastolic CHF, he has been seen by cardiology/structural cardiology team for his severe aortic stenosis, imaging was significant for questionable bladder mass, urology has been following as well.   Subjective:   Patient in bed, appears comfortable, denies any headache, no fever, no chest pain or pressure, no shortness of breath , no abdominal pain. No focal weakness.   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 he has not seen a physician for 6 to 7 years prior to admission, cardiology on board has been diuresed over 25 L since admission up to 07/31/2019, now blood pressure too low hold further diuresis until blood pressure stabilizes  and then use as needed diuretics, he has been diuresed close to 50 pounds this admission, continue UNNA Boots to assist in fluid mobilization.  Blood pressure too low to tolerate ACE, ARB or Entresto, beta-blocker overall better from pulmonary standpoint.  He was evaluated by TAVR team along with Cardiothoracic surgery for severe AS & open heart surgery  is likely to happen middle of the Thursday of this week, case discussed with cardiothoracic surgery on 07/31/2019.    Severe aortic stenosis -see above, avoid severe hypotension.   COVID-19 infection - incidental finding no treatment.   Hypotension.  Currently on midodrine and holding further diuresis.    D-dimers elevated at 6 on admission - venous ultrasound unremarkable, continue prophylactic dose Lovenox, D-dimer downtrending  COVID-19 Labs  Recent Labs    08/02/19 0500 08/03/19 0359  CRP 1.7* 1.8*    Lab Results  Component Value Date   SARSCOV2NAA POSITIVE (A) 07/21/2019   [place] NEGATIVE 06/23/2019    Liver cirrhosis - Patient denies any history of alcohol abuse in the past, this is most likely related to cardiac cirrhosis versus NASH.  Negative hepatitis panel, PCP to monitor and arrange for outpatient GI follow-up, if blood pressure allows will add low-dose beta-blocker.   Left lower extremity cellulitis - has been treated with antibiotics this admission and will nowl monitor clinically.  Some discoloration is due to massive edema.   Questionable findings of bladder mass - Urology consult done, bedside cystoscopy on 07/29/2019 unremarkable, outpatient follow-up with urology post discharge.  Hyperglycemia  -A1c is 6.1, prediabetic PCP to monitor.  Morbid obesity - BMI 35 follow with PCP for weight loss.  Hypothyroidism -TSH elevated at 14, with low normal free T4 at 0.65, started on low-dose Synthroid, check TSH in 4 to 6 weeks.   Code Status : Full  Disposition Plan  :   Status is: Inpatient  Remains inpatient  appropriate because:IV treatments appropriate due to intensity of illness or inability to take PO   Dispo: The patient is from: Home              Anticipated d/c is to: Home              Anticipated d/c date is: 3 days              Patient currently is not medically stable to d/c.  Patient still with significant volume overload, requiring further IV diuresis.  Plan for cardiac cath today, and bedside cystoscopy tomorrow.   Consults  :  Cardiology,Urology, cardiothoracic surgery  Procedures  :   Heart catheterization on 07/28/2019.     1.  Patent coronary arteries with no obstructive CAD 2.  Severe calcification and restriction of the aortic valve leaflets with hemodynamic evidence of critical aortic stenosis.  Mean aortic gradient 57 mmHg, peak gradient 108 mmHg, calculated aortic valve area 0.6 cm 3.  Elevated right and left heart pressures consistent with congestive heart failure   Bedside cystoscopy on 07/29/2019.  Unremarkable.  Bilateral leg ultrasound.  No DVT.    CTA -  1. Vascular findings and measurements pertinent to potential TAVR procedure, as detailed above. 2. Severe thickening calcification of the aortic valve, compatible with reported clinical history of severe aortic stenosis. 3. Cardiomegaly with evidence of mild interstitial pulmonary edema in the lungs and moderate bilateral pleural effusions; imaging findings concerning for congestive heart failure. 4. Amorphous mass-like area of enhancement along the posterior wall of the urinary bladder measuring approximately 6.4 x 2.0 x 2.5 cm. Nonemergent Urologic consultation is strongly recommended in the near future to better evaluate this finding, as the possibility of an infiltrative bladder neoplasm is not excluded. 5. There is also dilatation of the pulmonic trunk (3.6 cm in diameter), concerning for pulmonary arterial hypertension. 6. Morphologic changes in the liver suggesting early cirrhosis. 7. Small volume  of ascites. 8. Mild  diffuse body wall edema.  TTE  1. Left ventricular ejection fraction, by estimation, is 40 to 45%. The left ventricle has mildly decreased function. The left ventricle demonstrates global hypokinesis. There is moderate left ventricular hypertrophy. Left ventricular diastolic parameters are consistent with Grade II diastolic dysfunction  (pseudonormalization).  2. Right ventricular systolic function is mildly reduced. The right ventricular size is normal. There is moderately elevated pulmonary artery  systolic pressure.  3. Left atrial size was moderately dilated.  4. The mitral valve is abnormal. Trivial mitral valve regurgitation.  5. Cannot exclude a bicuspid valve. Aortic valve regurgitation is mild. Critical aortic valve stenosis. Aortic valve area, by VTI measures 0.73  cm. Aortic valve mean gradient measures 96.2 mmHg. Peak gradient of 137 mmHg. Aortic valve Vmax measures 5.86 m/s.  6. The inferior vena cava is dilated in size with <50% respiratory variability, suggesting right atrial pressure of 15 mmHg.  7. Moderate pleural effusion in the right lateral region.     DVT Prophylaxis  :  [place] lovenox  Lab Results  Component Value Date   PLT 204 08/04/2019    Antibiotics  :   Anti-infectives (From admission, onward)   Start     Dose/Rate Route Frequency Ordered Stop   07/25/19 1530  doxycycline (VIBRA-TABS) tablet 100 mg  Status:  Discontinued     100 mg Oral Every 12 hours 07/25/19 1521 07/27/19 0809   07/22/19 1400  ceFAZolin (ANCEF) IVPB 2g/100 mL premix  Status:  Discontinued     2 g 200 mL/hr over 30 Minutes Intravenous Every 8 hours 07/22/19 1223 07/25/19 1521   07/22/19 0000  vancomycin (VANCOREADY) IVPB 1250 mg/250 mL  Status:  Discontinued     1,250 mg 166.7 mL/hr over 90 Minutes Intravenous Every 12 hours 07/21/19 1141 07/22/19 1223   07/21/19 1145  vancomycin (VANCOREADY) IVPB 2000 mg/400 mL     2,000 mg 200 mL/hr over 120 Minutes Intravenous  Once 07/21/19  1141 07/21/19 1509        Objective:   Vitals:   08/03/19 0446 08/03/19 1249 08/03/19 2119 08/04/19 0447  BP: 93/74 104/75 101/74 99/70  Pulse: 71  74 74  Resp: 18  18 18   Temp: 97.7 F (36.5 C) 97.6 F (36.4 C) 98.4 F (36.9 C) 97.9 F (36.6 C)  TempSrc: Oral Oral Oral Oral  SpO2: 96%  94% 95%  Weight: 118.5 kg   116.1 kg  Height:        Wt Readings from Last 3 Encounters:  08/04/19 116.1 kg  09/24/11 125.2 kg     Intake/Output Summary (Last 24 hours) at 08/04/2019 1045 Last data filed at 08/04/2019 0813 Gross per 24 hour  Intake --  Output 2500 ml  Net -2500 ml     Physical Exam  Awake Alert, No new F.N deficits, Normal affect [place].AT,PERRAL Supple Neck,No JVD, No cervical lymphadenopathy appriciated.  Symmetrical Chest wall movement, Good air movement bilaterally, CTAB RRR,No Gallops, 2/5 aortic systolic murmur, No Parasternal Heave +ve B.Sounds, Abd Soft, No tenderness, No organomegaly appriciated, No rebound - guarding or rigidity. 2+ leg edema L>R, Unna boots placed.   Data Review:    CBC Recent Labs  Lab 07/31/19 0352 08/01/19 0428 08/02/19 0500 08/03/19 0359 08/04/19 0354  WBC 8.3 7.7 7.4 7.3 7.3  HGB 14.0 12.8* 12.8* 13.2 13.0  HCT 44.6 40.4 40.7 41.8 41.3  PLT 236 214 212 212 204  MCV 95.5 94.4 95.8 95.0 95.8  MCH 30.0 29.9 30.1 30.0 30.2  MCHC 31.4 31.7 31.4 31.6 31.5  RDW 17.0* 17.0* 17.0* 17.1* 16.9*    Chemistries  Recent Labs  Lab 07/31/19 0352 08/01/19 0428 08/02/19 0500 08/03/19 0359 08/04/19 0354  NA 137 136 134* 135 135  K 4.3 4.4 4.0 4.3 4.4  CL 94* 97* 97* 97* 99  CO2 34* 31 29 26 28   GLUCOSE 129* 125* 118* 116* 122*  BUN 18 18 18 17 16   CREATININE 0.98 0.99 0.90 0.85 0.87  CALCIUM 9.4 8.9 8.8* 9.1 8.9  MG 2.1 2.0 1.9 2.0 1.9  AST  --   --  30 29 30   ALT  --   --  21 22 21   ALKPHOS  --   --  271* 276* 263*  BILITOT  --   --  2.6* 2.6* 2.8*    ------------------------------------------------------------------------------------------------------------------ No results for input(s): CHOL, HDL, LDLCALC, TRIG, CHOLHDL, LDLDIRECT in the last 72 hours.  Lab Results  Component Value Date   HGBA1C 6.1 (H) 07/21/2019   ------------------------------------------------------------------------------------------------------------------ No results for input(s): TSH, T4TOTAL, T3FREE, THYROIDAB in the last 72 hours.  Invalid input(s): FREET3 ------------------------------------------------------------------------------------------------------------------ No results for input(s): VITAMINB12, FOLATE, FERRITIN, TIBC, IRON, RETICCTPCT in the last 72 hours.  Coagulation profile Recent Labs  Lab 08/03/19 0359  INR 1.1    No results for input(s): DDIMER in the last 72 hours.  Cardiac Enzymes No results for input(s): CKMB, TROPONINI, MYOGLOBIN in the last 168 hours.  Invalid input(s): CK ------------------------------------------------------------------------------------------------------------------    Component Value Date/Time   BNP 1,142.6 (H) 07/21/2019 1316    Inpatient Medications  Scheduled Meds: . aspirin EC  81 mg Oral Daily  . docusate sodium  100 mg Oral BID  . enoxaparin (LOVENOX) injection  60 mg Subcutaneous Q24H  . feeding supplement (ENSURE ENLIVE)  237 mL Oral Q1500  . feeding supplement (PRO-STAT SUGAR FREE 64)  30 mL Oral Daily  . levothyroxine  25 mcg Oral Q0600  . midodrine  5 mg Oral TID WC  . multivitamin with minerals  1 tablet Oral Daily  . saccharomyces boulardii  250 mg Oral BID  . sodium chloride flush  10-40 mL Intracatheter Q12H   Continuous Infusions:  PRN Meds:.acetaminophen **OR** [DISCONTINUED] acetaminophen, bisacodyl, hydrALAZINE, HYDROcodone-acetaminophen, [DISCONTINUED] ondansetron **OR** ondansetron (ZOFRAN) IV, polyethylene glycol  Micro Results No results found for this or any  previous visit (from the past 240 hour(s)).  Radiology Reports DG Chest 2 View  Result Date: 07/21/2019 CLINICAL DATA:  Chest pain and shortness of breath EXAM: CHEST - 2 VIEW COMPARISON:  06/25/2018 FINDINGS: Congested appearance of vessels with small pleural effusions. Cardiomegaly that is chronic. Negative aortic contours. IMPRESSION: Cardiomegaly with vascular congestion and small pleural effusions. Electronically Signed   By: Monte Fantasia M.D.   On: 07/21/2019 08:42   CARDIAC CATHETERIZATION  Result Date: 07/28/2019  There is severe aortic valve stenosis.  1.  Patent coronary arteries with no obstructive CAD 2.  Severe calcification and restriction of the aortic valve leaflets with hemodynamic evidence of critical aortic stenosis.  Mean aortic gradient 57 mmHg, peak gradient 108 mmHg, calculated aortic valve area 0.6 cm 3.  Elevated right and left heart pressures consistent with congestive heart failure Recommendations: Continued heart team evaluation for treatment options of critical aortic stenosis.  Continue IV diuresis as tolerated.  We will ask for inpatient cardiac surgical evaluation to help determine treatment plan.   CT CORONARY MORPH W/CTA COR W/SCORE W/CA W/CM &/OR WO/CM  Addendum Date: 07/27/2019   ADDENDUM REPORT: 07/27/2019 12:22 ADDENDUM: Extracardiac findings are described separately under dictation for contemporaneously obtained CTA chest, abdomen and pelvis performed on 07/25/2019. Please see dictation for that examination for description of relevant extracardiac findings. Electronically Signed   By: Vinnie Langton M.D.   On: 07/27/2019 12:22   Result Date: 07/27/2019 CLINICAL DATA:  54 year old male with severe aortic stenosis being evaluated for a TAVR procedure. EXAM: Cardiac TAVR CT TECHNIQUE: The patient was scanned on a [employer]. A 120 kV retrospective scan was triggered in the descending thoracic aorta at 111 HU's. Gantry rotation speed was 250 msecs  and collimation was .6 mm. No beta blockade or nitro were given. The 3D data set was reconstructed in 5% intervals of the R-R cycle. Systolic and diastolic phases were analyzed on a dedicated work station using MPR, MIP and VRT modes. The patient received 80 cc of contrast. FINDINGS: Aortic Valve: Trileaflet aortic valve with severely calcified and thickened leaflets and only mild calcifications extending into the LVOT. Aorta: Normal size, minimal plaque, no calcifications, no dissection. Sinotubular Junction: 30 x 29 mm Ascending Thoracic Aorta: 35 x 34 mm Aortic Arch: Not visualized. Descending Thoracic Aorta: 22 x 22 mm Sinus of Valsalva Measurements: Non-coronary: 34 mm Right -coronary: 31 mm Left -coronary: 35 mm Coronary Artery Height above Annulus: Left Main: 13.1 mm Right Coronary: 18.2 mm Virtual Basal Annulus Measurements: Maximum/Minimum Diameter: 34.0 x 25.7 mm Mean Diameter: 28.2 mm Perimeter: 94.1 mm Area: 626 mm2 Coronary Arteries: Calcium score is 0. Coronary Arteries:  Normal coronary origin.  Right dominance. RCA is a large dominant artery that gives rise to PDA and PLA. There is no plaque. Left main is a large artery that gives rise to LAD and LCX arteries. Left main has no plaque. LAD is a large vessel that has no plaque. LCX is a non-dominant artery that gives rise to one large OM1 branch. There is no plaque. Optimum Fluoroscopic Angle for Delivery: RAO 0 CRA 0 IMPRESSION: 1. Trileaflet aortic valve with severely calcified and thickened leaflets and only mild calcifications extending into the LVOT. Aortic valve calcium score 7968 consistent with severe aortic stenosis. Annular measurements suitable for delivery of a 29 mm Edwards-SAPIEN 3 valve. 2. Sufficient coronary to annulus distance. 3. Optimum Fluoroscopic Angle for Delivery:  RAO 0 CRA 0. 4. No thrombus in the left atrial appendage. 5. Coronary Arteries: Normal origin. Right dominance. Calcium score is 0. There are only minimal  irregularities. 6. Dilated pulmonary artery measuring 35 suggestive of pulmonary hypertension. Electronically Signed: By: Ena Dawley On: 07/26/2019 13:01   CT ANGIO CHEST AORTA W/CM & OR WO/CM  Result Date: 07/27/2019 CLINICAL DATA:  54 year old male with history of severe aortic stenosis. Preprocedural study prior to potential transcatheter aortic valve replacement (TAVR) procedure. EXAM: CT ANGIOGRAPHY CHEST, ABDOMEN AND PELVIS TECHNIQUE: Non-contrast CT of the chest was initially obtained. Multidetector CT imaging through the chest, abdomen and pelvis was performed using the standard protocol during bolus administration of intravenous contrast. Multiplanar reconstructed images and MIPs were obtained and reviewed to evaluate the vascular anatomy. CONTRAST:  124m OMNIPAQUE IOHEXOL 350 MG/ML SOLN COMPARISON:  No priors. FINDINGS: CTA CHEST FINDINGS Cardiovascular: Heart size is mildly enlarged. There is no significant pericardial fluid, thickening or pericardial calcification. Aortic atherosclerosis. No definite coronary artery calcifications. Severe thickening calcification of the aortic valve. Dilatation of the pulmonic trunk (3.6 cm in diameter). Mediastinum/Lymph Nodes: No pathologically enlarged mediastinal or hilar lymph nodes.  Esophagus is unremarkable in appearance. No axillary lymphadenopathy. Lungs/Pleura: Patchy but diffuse ground-glass attenuation and mild interlobular septal thickening noted throughout the lungs bilaterally, suggesting a background of mild interstitial pulmonary edema. Moderate bilateral pleural effusions lying dependently with some associated passive subsegmental atelectasis in the lower lobes of the lungs bilaterally. No definite suspicious appearing pulmonary nodules or masses. Musculoskeletal/Soft Tissues: Old healed fracture of the mid sternum with mild posttraumatic deformity. There are no aggressive appearing lytic or blastic lesions noted in the visualized portions of  the skeleton. CTA ABDOMEN AND PELVIS FINDINGS Hepatobiliary: Liver has a slightly shrunken appearance and nodular contour, indicative of underlying cirrhosis. No discrete cystic or solid hepatic lesions. No intra or extrahepatic biliary ductal dilatation. Gallbladder is nearly completely decompressed, and otherwise unremarkable in appearance. Pancreas: No pancreatic mass. No pancreatic ductal dilatation. No pancreatic or peripancreatic fluid collections or inflammatory changes. Spleen: Unremarkable. Adrenals/Urinary Tract: Left kidney and bilateral adrenal glands are normal in appearance. In the lower pole of the right kidney there is an exophytic low-attenuation nonenhancing lesion measuring 8 cm in diameter, compatible with a simple cyst. No hydroureteronephrosis. Amorphous mass-like area of enhancement along the posterior wall of the urinary bladder measuring approximately 6.4 x 2.0 x 2.5 cm (axial image 214 of series 6 and sagittal image 111 of series 9). Stomach/Bowel: Appearance of the stomach is normal. No pathologic dilatation of small bowel or colon. A few scattered colonic diverticulae are noted, without definite surrounding inflammatory changes to suggest an acute diverticulitis (study is limited by presence of small volume of ascites). Normal appendix. Vascular/Lymphatic: Aortic atherosclerosis with vascular findings and measurements pertinent to potential TAVR procedure, as detailed below. No aneurysm or dissection noted in the abdominal or pelvic vasculature. No lymphadenopathy noted in the abdomen or pelvis. Reproductive: Prostate gland and seminal vesicles are unremarkable in appearance. Other: Small volume of ascites.  No pneumoperitoneum. Musculoskeletal: There are no aggressive appearing lytic or blastic lesions noted in the visualized portions of the skeleton. Mild diffuse body wall edema. VASCULAR MEASUREMENTS PERTINENT TO TAVR: AORTA: Minimal Aortic Diameter-16 x 16 mm Severity of Aortic  Calcification-mild RIGHT PELVIS: Right Common Iliac Artery - Minimal Diameter-9.0 x 11.1 mm Tortuosity - mild Calcification-minimal Right External Iliac Artery - Minimal Diameter-9.5 x 9.1 mm Tortuosity - mild Calcification-none Right Common Femoral Artery - Minimal Diameter-6.4 x 8.6 mm Tortuosity - mild Calcification-none LEFT PELVIS: Left Common Iliac Artery - Minimal Diameter-11.6 x 9.9 mm Tortuosity - mild Calcification-minimal Left External Iliac Artery - Minimal Diameter-11.5 x 8.8 mm Tortuosity - mild Calcification-none Left Common Femoral Artery - Minimal Diameter-9.1 x 8.7 mm Tortuosity-mild Calcification-none Review of the MIP images confirms the above findings. IMPRESSION: 1. Vascular findings and measurements pertinent to potential TAVR procedure, as detailed above. 2. Severe thickening calcification of the aortic valve, compatible with reported clinical history of severe aortic stenosis. 3. Cardiomegaly with evidence of mild interstitial pulmonary edema in the lungs and moderate bilateral pleural effusions; imaging findings concerning for congestive heart failure. 4. Amorphous mass-like area of enhancement along the posterior wall of the urinary bladder measuring approximately 6.4 x 2.0 x 2.5 cm. Nonemergent Urologic consultation is strongly recommended in the near future to better evaluate this finding, as the possibility of an infiltrative bladder neoplasm is not excluded. 5. There is also dilatation of the pulmonic trunk (3.6 cm in diameter), concerning for pulmonary arterial hypertension. 6. Morphologic changes in the liver suggesting early cirrhosis. 7. Small volume of ascites. 8. Mild diffuse body wall edema. 9.  Additional incidental findings, as above. These results will be called to the ordering clinician or representative by the Radiologist Assistant, and communication documented in the PACS or [employer]. Electronically Signed   By: Vinnie Langton M.D.   On: 07/27/2019 12:47    ECHOCARDIOGRAM COMPLETE  Result Date: 07/21/2019    ECHOCARDIOGRAM REPORT   Patient Name:   SHAKUR LEMBO Date of Exam: 07/21/2019 Medical Rec #:  [id number]   Height:       72.0 in Accession #:    [id number]  Weight:       310.0 lb Date of Birth:  1965/06/02   BSA:          2.567 m Patient Age:    40 years    BP:           102/73 mmHg Patient Gender: M           HR:           73 bpm. Exam Location:  Inpatient Procedure: 2D Echo Indications:    CHF-Acute Systolic 497.02 / O37.85  History:        Patient has no prior history of Echocardiogram examinations.                 Risk Factors:Obesity. Pleural effusion on right.  Sonographer:    Vikki Ports Turrentine Referring Phys: Ada  1. Left ventricular ejection fraction, by estimation, is 40 to 45%. The left ventricle has mildly decreased function. The left ventricle demonstrates global hypokinesis. There is moderate left ventricular hypertrophy. Left ventricular diastolic parameters are consistent with Grade II diastolic dysfunction (pseudonormalization).  2. Right ventricular systolic function is mildly reduced. The right ventricular size is normal. There is moderately elevated pulmonary artery systolic pressure.  3. Left atrial size was moderately dilated.  4. The mitral valve is abnormal. Trivial mitral valve regurgitation.  5. Cannot exclude a bicuspid valve. Aortic valve regurgitation is mild. Critical aortic valve stenosis. Aortic valve area, by VTI measures 0.73 cm. Aortic valve mean gradient measures 96.2 mmHg. Peak gradient of 137 mmHg. Aortic valve Vmax measures 5.86 m/s.  6. The inferior vena cava is dilated in size with <50% respiratory variability, suggesting right atrial pressure of 15 mmHg.  7. Moderate pleural effusion in the right lateral region. FINDINGS  Left Ventricle: Left ventricular ejection fraction, by estimation, is 40 to 45%. The left ventricle has mildly decreased function. The left ventricle demonstrates global  hypokinesis. The left ventricular internal cavity size was normal in size. There is  moderate left ventricular hypertrophy. Left ventricular diastolic parameters are consistent with Grade II diastolic dysfunction (pseudonormalization). Indeterminate filling pressures. Right Ventricle: The right ventricular size is normal. No increase in right ventricular wall thickness. Right ventricular systolic function is mildly reduced. There is moderately elevated pulmonary artery systolic pressure. The tricuspid regurgitant velocity is 3.35 m/s, and with an assumed right atrial pressure of 15 mmHg, the estimated right ventricular systolic pressure is 88.5 mmHg. Left Atrium: Left atrial size was moderately dilated. Right Atrium: Right atrial size was normal in size. Pericardium: There is no evidence of pericardial effusion. Mitral Valve: The mitral valve is abnormal. Mild to moderate mitral annular calcification. Trivial mitral valve regurgitation. Tricuspid Valve: The tricuspid valve is grossly normal. Tricuspid valve regurgitation is mild. Aortic Valve: The aortic valve is tricuspid. . There is moderate thickening and severe calcifcation of the aortic valve. Aortic valve regurgitation is mild. Aortic regurgitation PHT measures 374 msec. Severe aortic stenosis is  present. There is moderate thickening of the aortic valve. There is severe calcifcation of the aortic valve. Aortic valve mean gradient measures 96.2 mmHg. Aortic valve peak gradient measures 137.3 mmHg. Aortic valve area, by VTI measures 0.73 cm. Pulmonic Valve: The pulmonic valve was normal in structure. Pulmonic valve regurgitation is not visualized. Aorta: The aortic root and ascending aorta are structurally normal, with no evidence of dilitation. Venous: The inferior vena cava is dilated in size with less than 50% respiratory variability, suggesting right atrial pressure of 15 mmHg. IAS/Shunts: No atrial level shunt detected by color flow Doppler. Additional  Comments: There is a moderate pleural effusion in the right lateral region.  LEFT VENTRICLE PLAX 2D LVIDd:         5.50 cm  Diastology LVIDs:         4.40 cm  LV e' lateral:   9.23 cm/s LV PW:         1.40 cm  LV E/e' lateral: 10.2 LV IVS:        1.40 cm  LV e' medial:    7.05 cm/s LVOT diam:     2.50 cm  LV E/e' medial:  13.4 LV SV:         108 LV SV Index:   42 LVOT Area:     4.91 cm  RIGHT VENTRICLE RV S prime:     7.20 cm/s TAPSE (M-mode): 1.2 cm LEFT ATRIUM              Index       RIGHT ATRIUM           Index LA diam:        5.90 cm  2.30 cm/m  RA Area:     22.60 cm LA Vol (A2C):   121.0 ml 47.14 ml/m RA Volume:   68.80 ml  26.80 ml/m LA Vol (A4C):   95.5 ml  37.21 ml/m LA Biplane Vol: 110.0 ml 42.86 ml/m  AORTIC VALVE AV Area (Vmax):    0.82 cm AV Area (Vmean):   0.72 cm AV Area (VTI):     0.73 cm AV Vmax:           585.80 cm/s AV Vmean:          474.000 cm/s AV VTI:            1.480 m AV Peak Grad:      137.3 mmHg AV Mean Grad:      96.2 mmHg LVOT Vmax:         97.30 cm/s LVOT Vmean:        69.800 cm/s LVOT VTI:          0.220 m LVOT/AV VTI ratio: 0.15 AI PHT:            374 msec  AORTA Ao Root diam: 3.00 cm MITRAL VALVE               TRICUSPID VALVE MV Area (PHT): 5.54 cm    TR Peak grad:   44.9 mmHg MV Decel Time: 137 msec    TR Vmax:        335.00 cm/s MV E velocity: 94.30 cm/s MV A velocity: 46.30 cm/s  SHUNTS MV E/A ratio:  2.04        Systemic VTI:  0.22 m                            Systemic Diam: 2.50 cm  Lyman Bishop MD Electronically signed by Lyman Bishop MD Signature Date/Time: 07/21/2019/3:42:23 PM    Final    VAS US CAROTID  Result Date: 07/27/2019 Carotid Arterial Duplex Study Indications:       Covid-19. Other Factors:     Critical Aortic Stenosis, CHF, Morbid Obesity. Limitations        Today's exam was limited due to the body habitus of the                    patient. Comparison Study:  No prior study on file for comparison Performing Technologist: Sharion Dove RVS   Examination Guidelines: A complete evaluation includes B-mode imaging, spectral Doppler, color Doppler, and power Doppler as needed of all accessible portions of each vessel. Bilateral testing is considered an integral part of a complete examination. Limited examinations for reoccurring indications may be performed as noted.  Right Carotid Findings: +----------+--------+--------+--------+------------------+------------------+           PSV cm/sEDV cm/sStenosisPlaque DescriptionComments           +----------+--------+--------+--------+------------------+------------------+ CCA Prox  41      7                                 intimal thickening +----------+--------+--------+--------+------------------+------------------+ CCA Distal51      12                                intimal thickening +----------+--------+--------+--------+------------------+------------------+ ICA Prox  42      12              heterogenous                         +----------+--------+--------+--------+------------------+------------------+ ICA Distal48      15                                                   +----------+--------+--------+--------+------------------+------------------+ ECA       62      10                                                   +----------+--------+--------+--------+------------------+------------------+ +----------+--------+-------+--------+-------------------+           PSV cm/sEDV cmsDescribeArm Pressure (mmHG) +----------+--------+-------+--------+-------------------+ [id number]                                         +----------+--------+-------+--------+-------------------+ +---------+--------+--+--------+-+ VertebralPSV cm/s28EDV cm/s9 +---------+--------+--+--------+-+  Left Carotid Findings: +----------+--------+--------+--------+------------------+------------------+           PSV cm/sEDV cm/sStenosisPlaque DescriptionComments            +----------+--------+--------+--------+------------------+------------------+ CCA Prox  73      12                                intimal thickening +----------+--------+--------+--------+------------------+------------------+ CCA Distal43      9  intimal thickening +----------+--------+--------+--------+------------------+------------------+ ICA Prox  54      11                                                   +----------+--------+--------+--------+------------------+------------------+ ICA Distal67      19                                                   +----------+--------+--------+--------+------------------+------------------+ ECA       53      5                                                    +----------+--------+--------+--------+------------------+------------------+ +----------+--------+--------+--------+-------------------+           PSV cm/sEDV cm/sDescribeArm Pressure (mmHG) +----------+--------+--------+--------+-------------------+ Subclavian113                                         +----------+--------+--------+--------+-------------------+ +---------+--------+--+--------+--+ VertebralPSV cm/s75EDV cm/s20 +---------+--------+--+--------+--+   Summary: Right Carotid: The extracranial vessels were near-normal with only minimal wall                thickening or plaque. Left Carotid: The extracranial vessels were near-normal with only minimal wall               thickening or plaque. Vertebrals:  Bilateral vertebral arteries demonstrate antegrade flow. Subclavians: Normal flow hemodynamics were seen in bilateral subclavian              arteries. *See table(s) above for measurements and observations.  Electronically signed by Servando Snare MD on 07/27/2019 at 12:53:06 AM.    Final    VAS [place] LOWER EXTREMITY VENOUS (DVT)  Result Date: 07/23/2019  Lower Venous DVTStudy Indications: Edema, and elevated ddimer.   Limitations: Body habitus and poor ultrasound/tissue interface. Comparison Study: no prior Performing Technologist: Abram Sander RVS  Examination Guidelines: A complete evaluation includes B-mode imaging, spectral Doppler, color Doppler, and power Doppler as needed of all accessible portions of each vessel. Bilateral testing is considered an integral part of a complete examination. Limited examinations for reoccurring indications may be performed as noted. The reflux portion of the exam is performed with the patient in reverse Trendelenburg.  +---------+---------------+---------+-----------+----------+--------------+ RIGHT    CompressibilityPhasicitySpontaneityPropertiesThrombus Aging +---------+---------------+---------+-----------+----------+--------------+ CFV      Full           Yes      Yes                                 +---------+---------------+---------+-----------+----------+--------------+ SFJ      Full                                                        +---------+---------------+---------+-----------+----------+--------------+ FV Prox  Full                                                        +---------+---------------+---------+-----------+----------+--------------+  FV Mid                  Yes      Yes                                 +---------+---------------+---------+-----------+----------+--------------+ FV Distal               Yes      Yes                                 +---------+---------------+---------+-----------+----------+--------------+ PFV      Full                                                        +---------+---------------+---------+-----------+----------+--------------+ POP      Full           Yes      Yes                                 +---------+---------------+---------+-----------+----------+--------------+ PTV      Full                                                         +---------+---------------+---------+-----------+----------+--------------+ PERO     Full                                                        +---------+---------------+---------+-----------+----------+--------------+   +---------+---------------+---------+-----------+----------+--------------+ LEFT     CompressibilityPhasicitySpontaneityPropertiesThrombus Aging +---------+---------------+---------+-----------+----------+--------------+ CFV      Full           Yes      Yes                                 +---------+---------------+---------+-----------+----------+--------------+ SFJ      Full                                                        +---------+---------------+---------+-----------+----------+--------------+ FV Prox  Full                                                        +---------+---------------+---------+-----------+----------+--------------+ FV Mid   Full                                                        +---------+---------------+---------+-----------+----------+--------------+  FV Distal               Yes      Yes                                 +---------+---------------+---------+-----------+----------+--------------+ PFV      Full                                                        +---------+---------------+---------+-----------+----------+--------------+ POP      Full           Yes      Yes                                 +---------+---------------+---------+-----------+----------+--------------+ PTV      Full                                                        +---------+---------------+---------+-----------+----------+--------------+ PERO                                                  Not visualized +---------+---------------+---------+-----------+----------+--------------+     Summary: BILATERAL: - No evidence of deep vein thrombosis seen in the lower extremities, bilaterally. -   *See table(s)  above for measurements and observations. Electronically signed by Monica Martinez MD on 07/23/2019 at 5:31:24 PM.    Final    CT Angio Abd/Pel w/ and/or w/o  Result Date: 07/27/2019 CLINICAL DATA:  54 year old male with history of severe aortic stenosis. Preprocedural study prior to potential transcatheter aortic valve replacement (TAVR) procedure. EXAM: CT ANGIOGRAPHY CHEST, ABDOMEN AND PELVIS TECHNIQUE: Non-contrast CT of the chest was initially obtained. Multidetector CT imaging through the chest, abdomen and pelvis was performed using the standard protocol during bolus administration of intravenous contrast. Multiplanar reconstructed images and MIPs were obtained and reviewed to evaluate the vascular anatomy. CONTRAST:  133m OMNIPAQUE IOHEXOL 350 MG/ML SOLN COMPARISON:  No priors. FINDINGS: CTA CHEST FINDINGS Cardiovascular: Heart size is mildly enlarged. There is no significant pericardial fluid, thickening or pericardial calcification. Aortic atherosclerosis. No definite coronary artery calcifications. Severe thickening calcification of the aortic valve. Dilatation of the pulmonic trunk (3.6 cm in diameter). Mediastinum/Lymph Nodes: No pathologically enlarged mediastinal or hilar lymph nodes. Esophagus is unremarkable in appearance. No axillary lymphadenopathy. Lungs/Pleura: Patchy but diffuse ground-glass attenuation and mild interlobular septal thickening noted throughout the lungs bilaterally, suggesting a background of mild interstitial pulmonary edema. Moderate bilateral pleural effusions lying dependently with some associated passive subsegmental atelectasis in the lower lobes of the lungs bilaterally. No definite suspicious appearing pulmonary nodules or masses. Musculoskeletal/Soft Tissues: Old healed fracture of the mid sternum with mild posttraumatic deformity. There are no aggressive appearing lytic or blastic lesions noted in the visualized portions of the skeleton. CTA ABDOMEN AND PELVIS  FINDINGS Hepatobiliary: Liver has a slightly shrunken appearance and nodular contour, indicative of underlying cirrhosis. No discrete  cystic or solid hepatic lesions. No intra or extrahepatic biliary ductal dilatation. Gallbladder is nearly completely decompressed, and otherwise unremarkable in appearance. Pancreas: No pancreatic mass. No pancreatic ductal dilatation. No pancreatic or peripancreatic fluid collections or inflammatory changes. Spleen: Unremarkable. Adrenals/Urinary Tract: Left kidney and bilateral adrenal glands are normal in appearance. In the lower pole of the right kidney there is an exophytic low-attenuation nonenhancing lesion measuring 8 cm in diameter, compatible with a simple cyst. No hydroureteronephrosis. Amorphous mass-like area of enhancement along the posterior wall of the urinary bladder measuring approximately 6.4 x 2.0 x 2.5 cm (axial image 214 of series 6 and sagittal image 111 of series 9). Stomach/Bowel: Appearance of the stomach is normal. No pathologic dilatation of small bowel or colon. A few scattered colonic diverticulae are noted, without definite surrounding inflammatory changes to suggest an acute diverticulitis (study is limited by presence of small volume of ascites). Normal appendix. Vascular/Lymphatic: Aortic atherosclerosis with vascular findings and measurements pertinent to potential TAVR procedure, as detailed below. No aneurysm or dissection noted in the abdominal or pelvic vasculature. No lymphadenopathy noted in the abdomen or pelvis. Reproductive: Prostate gland and seminal vesicles are unremarkable in appearance. Other: Small volume of ascites.  No pneumoperitoneum. Musculoskeletal: There are no aggressive appearing lytic or blastic lesions noted in the visualized portions of the skeleton. Mild diffuse body wall edema. VASCULAR MEASUREMENTS PERTINENT TO TAVR: AORTA: Minimal Aortic Diameter-16 x 16 mm Severity of Aortic Calcification-mild RIGHT PELVIS: Right  Common Iliac Artery - Minimal Diameter-9.0 x 11.1 mm Tortuosity - mild Calcification-minimal Right External Iliac Artery - Minimal Diameter-9.5 x 9.1 mm Tortuosity - mild Calcification-none Right Common Femoral Artery - Minimal Diameter-6.4 x 8.6 mm Tortuosity - mild Calcification-none LEFT PELVIS: Left Common Iliac Artery - Minimal Diameter-11.6 x 9.9 mm Tortuosity - mild Calcification-minimal Left External Iliac Artery - Minimal Diameter-11.5 x 8.8 mm Tortuosity - mild Calcification-none Left Common Femoral Artery - Minimal Diameter-9.1 x 8.7 mm Tortuosity-mild Calcification-none Review of the MIP images confirms the above findings. IMPRESSION: 1. Vascular findings and measurements pertinent to potential TAVR procedure, as detailed above. 2. Severe thickening calcification of the aortic valve, compatible with reported clinical history of severe aortic stenosis. 3. Cardiomegaly with evidence of mild interstitial pulmonary edema in the lungs and moderate bilateral pleural effusions; imaging findings concerning for congestive heart failure. 4. Amorphous mass-like area of enhancement along the posterior wall of the urinary bladder measuring approximately 6.4 x 2.0 x 2.5 cm. Nonemergent Urologic consultation is strongly recommended in the near future to better evaluate this finding, as the possibility of an infiltrative bladder neoplasm is not excluded. 5. There is also dilatation of the pulmonic trunk (3.6 cm in diameter), concerning for pulmonary arterial hypertension. 6. Morphologic changes in the liver suggesting early cirrhosis. 7. Small volume of ascites. 8. Mild diffuse body wall edema. 9. Additional incidental findings, as above. These results will be called to the ordering clinician or representative by the Radiologist Assistant, and communication documented in the PACS or [employer]. Electronically Signed   By: Vinnie Langton M.D.   On: 07/27/2019 12:47    Lala Lund M.D on 08/04/2019 at 10:45  AM   [facility] -  Office  [phone]

## 2019-08-04 NOTE — Progress Notes (Signed)
Orthopedic Tech Progress Note Patient Details:  Steve Richards 1965/04/20 [id number]  Ortho Devices Type of Ortho Device: Haematologist Ortho Device/Splint Location: BLE Ortho Device/Splint Interventions: Ordered, Application   Post Interventions Patient Tolerated: Well Instructions Provided: Care of device   Steve Richards 08/04/2019, 3:51 PM

## 2019-08-04 NOTE — Progress Notes (Signed)
Physical Therapy Treatment Patient Details Name: Steve Richards MRN: [id number] DOB: 09-03-1965 Today's Date: 08/04/2019    History of Present Illness 53y.o. male admitted on 07/22/19 for SOB and LE edema.  Dx with acute systolic/diastolic CHF, volume overload, COVID (+), LE dopplers pending to r/o DVT due to elevated D dimer, liver cirrhosis, L LE cellulits .  Pt with significant PMH of  Pleural effusion, cirrhosis of the liver with ascites d/p thoracentesis.  Caridology consulted and also found severe aortic stenosis.  Bil LE dopplers negative for DVT.  Due to have open valve replacement the week of 5/29.      PT Comments    Pt doing well with mobility. Discussed upcoming heart surgery and what to expect from mobility standpoint.    Follow Up Recommendations  No PT follow up     Equipment Recommendations  None recommended by PT    Recommendations for Other Services       Precautions / Restrictions Precautions Precautions: Fall Precaution Comments: weeping bil LE wounds left worse than R    Mobility  Bed Mobility Overal bed mobility: Modified Independent Bed Mobility: Sidelying to Sit;Sit to Sidelying   Sidelying to sit: Modified independent (Device/Increase time)     Sit to sidelying: Modified independent (Device/Increase time) General bed mobility comments: Practiced bed mobility with minimal use of hands in preparation for sternal precautions  Transfers Overall transfer level: Needs assistance     Sit to Stand: Modified independent (Device/Increase time);Min assist         General transfer comment: With use of hands pt able to stand modified independent. When practicing for coming sternal precautions had pt place hands on knees and he required min assist to stand like this.   Ambulation/Gait Ambulation/Gait assistance: Modified independent (Device/Increase time) Gait Distance (Feet): 3000 Feet Assistive device: None Gait Pattern/deviations: Step-through  pattern;Wide base of support Gait velocity: decreased Gait velocity interpretation: 1.31 - 2.62 ft/sec, indicative of limited community ambulator General Gait Details: Steady gait. Pt took 5 standing rest breaks.    Stairs             Wheelchair Mobility    Modified Rankin (Stroke Patients Only)       Balance Overall balance assessment: No apparent balance deficits (not formally assessed)                                          Cognition Arousal/Alertness: Awake/alert Behavior During Therapy: WFL for tasks assessed/performed Overall Cognitive Status: Within Functional Limits for tasks assessed                                        Exercises      General Comments General comments (skin integrity, edema, etc.): Discussion with pt about sternal precautions after open heart surgery. Gave him sternal precaution hand out "move in the tube" and practiced bed mobility and sit to stand following precautions.       Pertinent Vitals/Pain Pain Assessment: Faces Faces Pain Scale: No hurt    Home Living                      Prior Function            PT Goals (current goals can now be found in the care  plan section) Acute Rehab PT Goals Patient Stated Goal: to have AVR PT Goal Formulation: With patient Time For Goal Achievement: 08/18/19 Potential to Achieve Goals: Good Progress towards PT goals: Progressing toward goals;Goals met and updated - see care plan    Frequency    Min 2X/week      PT Plan Current plan remains appropriate    Co-evaluation              AM-PAC PT "6 Clicks" Mobility   Outcome Measure  Help needed turning from your back to your side while in a flat bed without using bedrails?: None Help needed moving from lying on your back to sitting on the side of a flat bed without using bedrails?: None Help needed moving to and from a bed to a chair (including a wheelchair)?: None Help needed  standing up from a chair using your arms (e.g., wheelchair or bedside chair)?: None Help needed to walk in hospital room?: None Help needed climbing 3-5 steps with a railing? : A Little 6 Click Score: 23    End of Session   Activity Tolerance: Patient tolerated treatment well Patient left: in chair;with call bell/phone within reach   PT Visit Diagnosis: Muscle weakness (generalized) (M62.81);Difficulty in walking, not elsewhere classified (R26.2)     Time: 2947-6546 PT Time Calculation (min) (ACUTE ONLY): 30 min  Charges:  $Gait Training: 8-22 mins $Self Care/Home Management: [place] Pager [phone] Office [place] 08/04/2019, 12:08 PM

## 2019-08-04 NOTE — Progress Notes (Signed)
Pre AVR vascular testing complete.  Please see CV Proc tab for preliminary results. [place], RVT 5:22 PM  08/04/2019

## 2019-08-05 DIAGNOSIS — I350 Nonrheumatic aortic (valve) stenosis: Secondary | ICD-10-CM

## 2019-08-05 LAB — COMPREHENSIVE METABOLIC PANEL
ALT: 22 U/L (ref 0–44)
AST: 31 U/L (ref 15–41)
Albumin: 3.6 g/dL (ref 3.5–5.0)
Alkaline Phosphatase: 297 U/L — ABNORMAL HIGH (ref 38–126)
Anion gap: 9 (ref 5–15)
BUN: 17 mg/dL (ref 6–20)
CO2: 29 mmol/L (ref 22–32)
Calcium: 9.1 mg/dL (ref 8.9–10.3)
Chloride: 98 mmol/L (ref 98–111)
Creatinine, Ser: 0.86 mg/dL (ref 0.61–1.24)
GFR calc Af Amer: >60 mL/min (ref >60)
GFR calc non Af Amer: >60 mL/min (ref >60)
Glucose, Bld: 131 mg/dL — ABNORMAL HIGH (ref 70–99)
Potassium: 4.5 mmol/L (ref 3.5–5.1)
Sodium: 136 mmol/L (ref 135–145)
Total Bilirubin: 2.7 mg/dL — ABNORMAL HIGH (ref 0.3–1.2)
Total Protein: 7.8 g/dL (ref 6.5–8.1)

## 2019-08-05 LAB — ABO/RH: ABO/RH(D): A POS

## 2019-08-05 LAB — CBC
HCT: 42.4 % (ref 39.0–52.0)
Hemoglobin: 13.3 g/dL (ref 13.0–17.0)
MCH: 30.3 pg (ref 26.0–34.0)
MCHC: 31.4 g/dL (ref 30.0–36.0)
MCV: 96.6 fL (ref 80.0–100.0)
Platelets: 198 10*3/uL (ref 150–400)
RBC: 4.39 MIL/uL (ref 4.22–5.81)
RDW: 17.1 % — ABNORMAL HIGH (ref 11.5–15.5)
WBC: 7.2 10*3/uL (ref 4.0–10.5)
nRBC: 0 % (ref 0.0–0.2)

## 2019-08-05 LAB — MAGNESIUM: Magnesium: 2.1 mg/dL (ref 1.7–2.4)

## 2019-08-05 LAB — D-DIMER, QUANTITATIVE: D-Dimer, Quant: 1.87 ug/mL-FEU — ABNORMAL HIGH (ref 0.00–0.50)

## 2019-08-05 MED ORDER — SODIUM CHLORIDE 0.9 % IV SOLN
INTRAVENOUS | Status: DC
  Filled 2019-08-05: qty 30

## 2019-08-05 MED ORDER — NITROGLYCERIN IN D5W 200-5 MCG/ML-% IV SOLN
2.0000 ug/min | INTRAVENOUS | Status: DC
  Filled 2019-08-05: qty 250

## 2019-08-05 MED ORDER — VANCOMYCIN HCL 1500 MG/300ML IV SOLN
1500.0000 mg | INTRAVENOUS | Status: DC
  Filled 2019-08-05: qty 300

## 2019-08-05 MED ORDER — PHENYLEPHRINE HCL-NACL 20-0.9 MG/250ML-% IV SOLN
30.0000 ug/min | INTRAVENOUS | Status: AC
  Administered 2019-08-06: 25 ug/min via INTRAVENOUS
  Filled 2019-08-05: qty 250

## 2019-08-05 MED ORDER — TRANEXAMIC ACID 1000 MG/10ML IV SOLN
1.5000 mg/kg/h | INTRAVENOUS | Status: DC
  Filled 2019-08-05 (×2): qty 25

## 2019-08-05 MED ORDER — MAGNESIUM SULFATE 50 % IJ SOLN
40.0000 meq | INTRAMUSCULAR | Status: DC
  Filled 2019-08-05: qty 9.85

## 2019-08-05 MED ORDER — TRANEXAMIC ACID (OHS) BOLUS VIA INFUSION
15.0000 mg/kg | INTRAVENOUS | Status: AC
  Administered 2019-08-06: 1794 mg via INTRAVENOUS
  Filled 2019-08-05: qty 1794

## 2019-08-05 MED ORDER — TRANEXAMIC ACID (OHS) PUMP PRIME SOLUTION
2.0000 mg/kg | INTRAVENOUS | Status: DC
  Filled 2019-08-05: qty 2.39

## 2019-08-05 MED ORDER — POTASSIUM CHLORIDE 2 MEQ/ML IV SOLN
80.0000 meq | INTRAVENOUS | Status: DC
  Filled 2019-08-05: qty 40

## 2019-08-05 MED ORDER — CHLORHEXIDINE GLUCONATE CLOTH 2 % EX PADS: 6.0000 | MEDICATED_PAD | Freq: Once | CUTANEOUS | Status: DC

## 2019-08-05 MED ORDER — DEXMEDETOMIDINE HCL IN NACL 400 MCG/100ML IV SOLN
0.1000 ug/kg/h | INTRAVENOUS | Status: AC
  Administered 2019-08-06: .7 ug/kg/h via INTRAVENOUS
  Filled 2019-08-05: qty 100

## 2019-08-05 MED ORDER — SODIUM CHLORIDE 0.9 % IV SOLN
750.0000 mg | INTRAVENOUS | Status: DC
  Filled 2019-08-05: qty 750

## 2019-08-05 MED ORDER — SODIUM CHLORIDE 0.9 % IV SOLN
1.5000 g | INTRAVENOUS | Status: DC
  Filled 2019-08-05: qty 1.5

## 2019-08-05 MED ORDER — BISACODYL 5 MG PO TBEC
5.0000 mg | DELAYED_RELEASE_TABLET | Freq: Once | ORAL | Status: AC
  Administered 2019-08-05: 5 mg via ORAL
  Filled 2019-08-05: qty 1

## 2019-08-05 MED ORDER — INSULIN REGULAR(HUMAN) IN NACL 100-0.9 UT/100ML-% IV SOLN
INTRAVENOUS | Status: DC
  Filled 2019-08-05: qty 100

## 2019-08-05 MED ORDER — CHLORHEXIDINE GLUCONATE 0.12 % MT SOLN
15.0000 mL | Freq: Once | OROMUCOSAL | Status: AC
  Administered 2019-08-06: 15 mL via OROMUCOSAL
  Filled 2019-08-05: qty 15

## 2019-08-05 MED ORDER — CHLORHEXIDINE GLUCONATE CLOTH 2 % EX PADS
6.0000 | MEDICATED_PAD | Freq: Once | CUTANEOUS | Status: AC
  Administered 2019-08-06: 6 via TOPICAL

## 2019-08-05 MED ORDER — TEMAZEPAM 15 MG PO CAPS: 15.0000 mg | ORAL_CAPSULE | Freq: Once | ORAL | Status: DC | PRN

## 2019-08-05 MED ORDER — MILRINONE LACTATE IN DEXTROSE 20-5 MG/100ML-% IV SOLN
0.3000 ug/kg/min | INTRAVENOUS | Status: AC
  Administered 2019-08-06: .375 ug/kg/min via INTRAVENOUS
  Filled 2019-08-05: qty 100

## 2019-08-05 MED ORDER — EPINEPHRINE HCL 5 MG/250ML IV SOLN IN NS
0.0000 ug/min | INTRAVENOUS | Status: AC
  Administered 2019-08-06: 3 ug/min via INTRAVENOUS
  Filled 2019-08-05: qty 250

## 2019-08-05 MED ORDER — PLASMA-LYTE 148 IV SOLN
INTRAVENOUS | Status: DC
  Filled 2019-08-05: qty 2.5

## 2019-08-05 MED ORDER — METOPROLOL TARTRATE 12.5 MG HALF TABLET
12.5000 mg | ORAL_TABLET | Freq: Once | ORAL | Status: AC
  Administered 2019-08-06: 12.5 mg via ORAL
  Filled 2019-08-05: qty 1

## 2019-08-05 MED ORDER — NOREPINEPHRINE 4 MG/250ML-% IV SOLN
0.0000 ug/min | INTRAVENOUS | Status: AC
  Administered 2019-08-06: 3 ug/min via INTRAVENOUS
  Filled 2019-08-05: qty 250

## 2019-08-05 NOTE — Progress Notes (Signed)
PROGRESS NOTE                                                                                                                                                                                                             Patient Demographics:    Steve Richards, is a 54 y.o. male, DOB - Jul 03, 1965, ZMC:[id number]  Admit date - 07/21/2019   Admitting Physician Karmen Bongo, MD  Outpatient Primary MD for the patient is System, Pcp Not In  LOS - 97   Chief Complaint  Patient presents with  . Shortness of Breath  . Leg Swelling       Brief Narrative     Steve Richards is a 54 y.o. male with medical history significant of R pleural effusion in April 2021 presenting with SOB and LE edema.    Patient did not follow with PCP for last 6 years, patient with recent work-up as an outpatient and negative for CHF, volume overload with thoracentesis status post drain, as well evidence of liver cirrhosis, and recent diagnosis of left lower extremity cellulitis where he was started on Keflex, ED secondary to dyspnea, evidence of volume overload, as well he tested Covid positive on admission .  His work-up was significant for volume overload, with severe aortic stenosis, acute systolic/diastolic CHF, he has been seen by cardiology/structural cardiology team for his severe aortic stenosis, imaging was significant for questionable bladder mass, urology has been following as well.   Subjective:   Patient in bed, appears comfortable, denies any headache, no fever, no chest pain or pressure, no shortness of breath , no abdominal pain. No focal weakness.   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 he has not seen a physician for 6 to 7 years prior to admission, cardiology on board has been diuresed over 25 L since admission up to 07/31/2019, now blood pressure too low hold further diuresis until blood pressure stabilizes  and then use as needed diuretics, he has been diuresed close to 50 pounds this admission, continue UNNA Boots to assist in fluid mobilization.  Blood pressure too low to tolerate ACE, ARB or Entresto, beta-blocker overall better from pulmonary standpoint.  He was evaluated by TAVR team along with Cardiothoracic surgery. -Plan to proceed with open heart surgery  with bioprosthetic valve, hopefully by tomorrow.   Severe aortic stenosis -see above, avoid severe hypotension.   COVID-19 infection - incidental finding no treatment.   Hypotension.  Currently on midodrine and holding further diuresis.    D-dimers elevated at 6 on admission - venous ultrasound unremarkable, continue prophylactic dose Lovenox, D-dimer downtrending  COVID-19 Labs  Recent Labs    08/03/19 0359 08/05/19 0248  DDIMER  --  1.87*  CRP 1.8*  --     Lab Results  Component Value Date   SARSCOV2NAA POSITIVE (A) 07/21/2019   [place] NEGATIVE 06/23/2019    Liver cirrhosis - Patient denies any history of alcohol abuse in the past, this is most likely related to cardiac cirrhosis versus NASH.  Negative hepatitis panel, PCP to monitor and arrange for outpatient GI follow-up, if blood pressure allows will add low-dose beta-blocker.   Left lower extremity cellulitis - has been treated with antibiotics this admission and will nowl monitor clinically.  Some discoloration is due to massive edema.   Questionable findings of bladder mass - Urology consult done, bedside cystoscopy on 07/29/2019 unremarkable, outpatient follow-up with urology post discharge.  Hyperglycemia  -A1c is 6.1, prediabetic PCP to monitor.  Morbid obesity - BMI 35 follow with PCP for weight loss.  Hypothyroidism -TSH elevated at 14, with low normal free T4 at 0.65, started on low-dose Synthroid, check TSH in 4 to 6 weeks.   Code Status : Full  Disposition Plan  :   Status is: Inpatient  Remains inpatient appropriate because:IV treatments  appropriate due to intensity of illness or inability to take PO   Dispo: The patient is from: Home              Anticipated d/c is to: Home              Anticipated d/c date is: 3 days              Patient currently is not medically stable to d/c.  Patient still with significant volume overload, requiring further IV diuresis. Plan for open heart surgery tomorrow with aortic valve surgery.  Consults  :  Cardiology,Urology, cardiothoracic surgery  Procedures  :   Heart catheterization on 07/28/2019.     1.  Patent coronary arteries with no obstructive CAD 2.  Severe calcification and restriction of the aortic valve leaflets with hemodynamic evidence of critical aortic stenosis.  Mean aortic gradient 57 mmHg, peak gradient 108 mmHg, calculated aortic valve area 0.6 cm 3.  Elevated right and left heart pressures consistent with congestive heart failure   Bedside cystoscopy on 07/29/2019.  Unremarkable.  Bilateral leg ultrasound.  No DVT.    CTA -  1. Vascular findings and measurements pertinent to potential TAVR procedure, as detailed above. 2. Severe thickening calcification of the aortic valve, compatible with reported clinical history of severe aortic stenosis. 3. Cardiomegaly with evidence of mild interstitial pulmonary edema in the lungs and moderate bilateral pleural effusions; imaging findings concerning for congestive heart failure. 4. Amorphous mass-like area of enhancement along the posterior wall of the urinary bladder measuring approximately 6.4 x 2.0 x 2.5 cm. Nonemergent Urologic consultation is strongly recommended in the near future to better evaluate this finding, as the possibility of an infiltrative bladder neoplasm is not excluded. 5. There is also dilatation of the pulmonic trunk (3.6 cm in diameter), concerning for pulmonary arterial hypertension. 6. Morphologic changes in the liver suggesting early cirrhosis. 7. Small volume of ascites. 8. Mild diffuse body  wall  edema.  TTE  1. Left ventricular ejection fraction, by estimation, is 40 to 45%. The left ventricle has mildly decreased function. The left ventricle demonstrates global hypokinesis. There is moderate left ventricular hypertrophy. Left ventricular diastolic parameters are consistent with Grade II diastolic dysfunction  (pseudonormalization).  2. Right ventricular systolic function is mildly reduced. The right ventricular size is normal. There is moderately elevated pulmonary artery  systolic pressure.  3. Left atrial size was moderately dilated.  4. The mitral valve is abnormal. Trivial mitral valve regurgitation.  5. Cannot exclude a bicuspid valve. Aortic valve regurgitation is mild. Critical aortic valve stenosis. Aortic valve area, by VTI measures 0.73  cm. Aortic valve mean gradient measures 96.2 mmHg. Peak gradient of 137 mmHg. Aortic valve Vmax measures 5.86 m/s.  6. The inferior vena cava is dilated in size with <50% respiratory variability, suggesting right atrial pressure of 15 mmHg.  7. Moderate pleural effusion in the right lateral region.     DVT Prophylaxis  :  Pastos lovenox  Lab Results  Component Value Date   PLT 198 08/05/2019    Antibiotics  :   Anti-infectives (From admission, onward)   Start     Dose/Rate Route Frequency Ordered Stop   08/06/19 0400  vancomycin (VANCOREADY) IVPB 1500 mg/300 mL     1,500 mg 150 mL/hr over 120 Minutes Intravenous To Surgery 08/05/19 1012 08/07/19 0400   08/06/19 0400  cefUROXime (ZINACEF) 1.5 g in sodium chloride 0.9 % 100 mL IVPB     1.5 g 200 mL/hr over 30 Minutes Intravenous To Surgery 08/05/19 1012 08/07/19 0400   08/06/19 0400  cefUROXime (ZINACEF) 750 mg in sodium chloride 0.9 % 100 mL IVPB     750 mg 200 mL/hr over 30 Minutes Intravenous To Surgery 08/05/19 1012 08/07/19 0400   07/25/19 1530  doxycycline (VIBRA-TABS) tablet 100 mg  Status:  Discontinued     100 mg Oral Every 12 hours 07/25/19 1521 07/27/19 0809    07/22/19 1400  ceFAZolin (ANCEF) IVPB 2g/100 mL premix  Status:  Discontinued     2 g 200 mL/hr over 30 Minutes Intravenous Every 8 hours 07/22/19 1223 07/25/19 1521   07/22/19 0000  vancomycin (VANCOREADY) IVPB 1250 mg/250 mL  Status:  Discontinued     1,250 mg 166.7 mL/hr over 90 Minutes Intravenous Every 12 hours 07/21/19 1141 07/22/19 1223   07/21/19 1145  vancomycin (VANCOREADY) IVPB 2000 mg/400 mL     2,000 mg 200 mL/hr over 120 Minutes Intravenous  Once 07/21/19 1141 07/21/19 1509        Objective:   Vitals:   08/04/19 1645 08/04/19 2029 08/05/19 0437 08/05/19 0800  BP: 97/72 97/74  97/71  Pulse: 77 73 77 77  Resp: 20 20 20 16   Temp: 97.8 F (36.6 C) 98.4 F (36.9 C) 97.7 F (36.5 C) 97.8 F (36.6 C)  TempSrc: Oral Oral Oral Oral  SpO2: 90% 97% 96% 91%  Weight:   119.6 kg   Height:        Wt Readings from Last 3 Encounters:  08/05/19 119.6 kg  09/24/11 125.2 kg     Intake/Output Summary (Last 24 hours) at 08/05/2019 1256 Last data filed at 08/05/2019 1100 Gross per 24 hour  Intake 240 ml  Output 2300 ml  Net -2060 ml     Physical Exam  Awake Alert, Oriented X 3, No new F.N deficits, Normal affect Symmetrical Chest wall movement, Good air movement bilaterally, CTAB RRR,No Gallops,Rubs ,  SYS  Murmur present , No Parasternal Heave +ve B.Sounds, Abd Soft, No tenderness, No rebound - guarding or rigidity. No Cyanosis, Clubbing , +2 edema bilaterally up to the knees, but this has significantly improved, has Ace wraps bilaterally.    Data Review:    CBC Recent Labs  Lab 08/01/19 0428 08/02/19 0500 08/03/19 0359 08/04/19 0354 08/05/19 0248  WBC 7.7 7.4 7.3 7.3 7.2  HGB 12.8* 12.8* 13.2 13.0 13.3  HCT 40.4 40.7 41.8 41.3 42.4  PLT 214 212 212 204 198  MCV 94.4 95.8 95.0 95.8 96.6  MCH 29.9 30.1 30.0 30.2 30.3  MCHC 31.7 31.4 31.6 31.5 31.4  RDW 17.0* 17.0* 17.1* 16.9* 17.1*    Chemistries  Recent Labs  Lab 08/01/19 0428 08/02/19 0500  08/03/19 0359 08/04/19 0354 08/05/19 0248  NA 136 134* 135 135 136  K 4.4 4.0 4.3 4.4 4.5  CL 97* 97* 97* 99 98  CO2 31 29 26 28 29   GLUCOSE 125* 118* 116* 122* 131*  BUN 18 18 17 16 17   CREATININE 0.99 0.90 0.85 0.87 0.86  CALCIUM 8.9 8.8* 9.1 8.9 9.1  MG 2.0 1.9 2.0 1.9 2.1  AST  --  30 29 30 31   ALT  --  21 22 21 22   ALKPHOS  --  271* 276* 263* 297*  BILITOT  --  2.6* 2.6* 2.8* 2.7*   ------------------------------------------------------------------------------------------------------------------ No results for input(s): CHOL, HDL, LDLCALC, TRIG, CHOLHDL, LDLDIRECT in the last 72 hours.  Lab Results  Component Value Date   HGBA1C 6.1 (H) 07/21/2019   ------------------------------------------------------------------------------------------------------------------ No results for input(s): TSH, T4TOTAL, T3FREE, THYROIDAB in the last 72 hours.  Invalid input(s): FREET3 ------------------------------------------------------------------------------------------------------------------ No results for input(s): VITAMINB12, FOLATE, FERRITIN, TIBC, IRON, RETICCTPCT in the last 72 hours.  Coagulation profile Recent Labs  Lab 08/03/19 0359  INR 1.1    Recent Labs    08/05/19 0248  DDIMER 1.87*    Cardiac Enzymes No results for input(s): CKMB, TROPONINI, MYOGLOBIN in the last 168 hours.  Invalid input(s): CK ------------------------------------------------------------------------------------------------------------------    Component Value Date/Time   BNP 1,142.6 (H) 07/21/2019 1316    Inpatient Medications  Scheduled Meds: . aspirin EC  81 mg Oral Daily  . docusate sodium  100 mg Oral BID  . enoxaparin (LOVENOX) injection  60 mg Subcutaneous Q24H  . [START ON 08/06/2019] epinephrine  0-10 mcg/min Intravenous To OR  . feeding supplement (ENSURE ENLIVE)  237 mL Oral Q1500  . feeding supplement (PRO-STAT SUGAR FREE 64)  30 mL Oral Daily  . [START ON 08/06/2019]  heparin-papaverine-plasmalyte irrigation   Irrigation To OR  . [START ON 08/06/2019] insulin   Intravenous To OR  . levothyroxine  25 mcg Oral Q0600  . [START ON 08/06/2019] magnesium sulfate  40 mEq Other To OR  . midodrine  5 mg Oral TID WC  . multivitamin with minerals  1 tablet Oral Daily  . [START ON 08/06/2019] phenylephrine  30-200 mcg/min Intravenous To OR  . [START ON 08/06/2019] potassium chloride  80 mEq Other To OR  . saccharomyces boulardii  250 mg Oral BID  . sodium chloride flush  10-40 mL Intracatheter Q12H  . [START ON 08/06/2019] tranexamic acid  15 mg/kg Intravenous To OR  . [START ON 08/06/2019] tranexamic acid  2 mg/kg Intracatheter To OR   Continuous Infusions: . [START ON 08/06/2019] cefUROXime (ZINACEF)  IV    . [START ON 08/06/2019] cefUROXime (ZINACEF)  IV    . [START ON 08/06/2019]  dexmedetomidine    . [START ON 08/06/2019] heparin 30,000 units/NS 1000 mL solution for CELLSAVER    . [START ON 08/06/2019] milrinone    . [START ON 08/06/2019] nitroGLYCERIN    . [START ON 08/06/2019] norepinephrine    . [START ON 08/06/2019] tranexamic acid (CYKLOKAPRON) infusion ([facility])    . [START ON 08/06/2019] vancomycin     PRN Meds:.acetaminophen **OR** [DISCONTINUED] acetaminophen, bisacodyl, hydrALAZINE, HYDROcodone-acetaminophen, [DISCONTINUED] ondansetron **OR** ondansetron (ZOFRAN) IV, polyethylene glycol  Micro Results No results found for this or any previous visit (from the past 240 hour(s)).  Radiology Reports DG Chest 2 View  Result Date: 07/21/2019 CLINICAL DATA:  Chest pain and shortness of breath EXAM: CHEST - 2 VIEW COMPARISON:  06/25/2018 FINDINGS: Congested appearance of vessels with small pleural effusions. Cardiomegaly that is chronic. Negative aortic contours. IMPRESSION: Cardiomegaly with vascular congestion and small pleural effusions. Electronically Signed   By: Monte Fantasia M.D.   On: 07/21/2019 08:42   CARDIAC CATHETERIZATION  Result Date: 07/28/2019  There is severe  aortic valve stenosis.  1.  Patent coronary arteries with no obstructive CAD 2.  Severe calcification and restriction of the aortic valve leaflets with hemodynamic evidence of critical aortic stenosis.  Mean aortic gradient 57 mmHg, peak gradient 108 mmHg, calculated aortic valve area 0.6 cm 3.  Elevated right and left heart pressures consistent with congestive heart failure Recommendations: Continued heart team evaluation for treatment options of critical aortic stenosis.  Continue IV diuresis as tolerated.  We will ask for inpatient cardiac surgical evaluation to help determine treatment plan.   CT CORONARY MORPH W/CTA COR W/SCORE W/CA W/CM &/OR WO/CM  Addendum Date: 07/27/2019   ADDENDUM REPORT: 07/27/2019 12:22 ADDENDUM: Extracardiac findings are described separately under dictation for contemporaneously obtained CTA chest, abdomen and pelvis performed on 07/25/2019. Please see dictation for that examination for description of relevant extracardiac findings. Electronically Signed   By: Vinnie Langton M.D.   On: 07/27/2019 12:22   Result Date: 07/27/2019 CLINICAL DATA:  54 year old male with severe aortic stenosis being evaluated for a TAVR procedure. EXAM: Cardiac TAVR CT TECHNIQUE: The patient was scanned on a [employer]. A 120 kV retrospective scan was triggered in the descending thoracic aorta at 111 HU's. Gantry rotation speed was 250 msecs and collimation was .6 mm. No beta blockade or nitro were given. The 3D data set was reconstructed in 5% intervals of the R-R cycle. Systolic and diastolic phases were analyzed on a dedicated work station using MPR, MIP and VRT modes. The patient received 80 cc of contrast. FINDINGS: Aortic Valve: Trileaflet aortic valve with severely calcified and thickened leaflets and only mild calcifications extending into the LVOT. Aorta: Normal size, minimal plaque, no calcifications, no dissection. Sinotubular Junction: 30 x 29 mm Ascending Thoracic Aorta: 35 x  34 mm Aortic Arch: Not visualized. Descending Thoracic Aorta: 22 x 22 mm Sinus of Valsalva Measurements: Non-coronary: 34 mm Right -coronary: 31 mm Left -coronary: 35 mm Coronary Artery Height above Annulus: Left Main: 13.1 mm Right Coronary: 18.2 mm Virtual Basal Annulus Measurements: Maximum/Minimum Diameter: 34.0 x 25.7 mm Mean Diameter: 28.2 mm Perimeter: 94.1 mm Area: 626 mm2 Coronary Arteries: Calcium score is 0. Coronary Arteries:  Normal coronary origin.  Right dominance. RCA is a large dominant artery that gives rise to PDA and PLA. There is no plaque. Left main is a large artery that gives rise to LAD and LCX arteries. Left main has no plaque. LAD is a large vessel that has  no plaque. LCX is a non-dominant artery that gives rise to one large OM1 branch. There is no plaque. Optimum Fluoroscopic Angle for Delivery: RAO 0 CRA 0 IMPRESSION: 1. Trileaflet aortic valve with severely calcified and thickened leaflets and only mild calcifications extending into the LVOT. Aortic valve calcium score 7968 consistent with severe aortic stenosis. Annular measurements suitable for delivery of a 29 mm Edwards-SAPIEN 3 valve. 2. Sufficient coronary to annulus distance. 3. Optimum Fluoroscopic Angle for Delivery:  RAO 0 CRA 0. 4. No thrombus in the left atrial appendage. 5. Coronary Arteries: Normal origin. Right dominance. Calcium score is 0. There are only minimal irregularities. 6. Dilated pulmonary artery measuring 35 suggestive of pulmonary hypertension. Electronically Signed: By: Ena Dawley On: 07/26/2019 13:01   CT ANGIO CHEST AORTA W/CM & OR WO/CM  Result Date: 07/27/2019 CLINICAL DATA:  54 year old male with history of severe aortic stenosis. Preprocedural study prior to potential transcatheter aortic valve replacement (TAVR) procedure. EXAM: CT ANGIOGRAPHY CHEST, ABDOMEN AND PELVIS TECHNIQUE: Non-contrast CT of the chest was initially obtained. Multidetector CT imaging through the chest, abdomen and  pelvis was performed using the standard protocol during bolus administration of intravenous contrast. Multiplanar reconstructed images and MIPs were obtained and reviewed to evaluate the vascular anatomy. CONTRAST:  139m OMNIPAQUE IOHEXOL 350 MG/ML SOLN COMPARISON:  No priors. FINDINGS: CTA CHEST FINDINGS Cardiovascular: Heart size is mildly enlarged. There is no significant pericardial fluid, thickening or pericardial calcification. Aortic atherosclerosis. No definite coronary artery calcifications. Severe thickening calcification of the aortic valve. Dilatation of the pulmonic trunk (3.6 cm in diameter). Mediastinum/Lymph Nodes: No pathologically enlarged mediastinal or hilar lymph nodes. Esophagus is unremarkable in appearance. No axillary lymphadenopathy. Lungs/Pleura: Patchy but diffuse ground-glass attenuation and mild interlobular septal thickening noted throughout the lungs bilaterally, suggesting a background of mild interstitial pulmonary edema. Moderate bilateral pleural effusions lying dependently with some associated passive subsegmental atelectasis in the lower lobes of the lungs bilaterally. No definite suspicious appearing pulmonary nodules or masses. Musculoskeletal/Soft Tissues: Old healed fracture of the mid sternum with mild posttraumatic deformity. There are no aggressive appearing lytic or blastic lesions noted in the visualized portions of the skeleton. CTA ABDOMEN AND PELVIS FINDINGS Hepatobiliary: Liver has a slightly shrunken appearance and nodular contour, indicative of underlying cirrhosis. No discrete cystic or solid hepatic lesions. No intra or extrahepatic biliary ductal dilatation. Gallbladder is nearly completely decompressed, and otherwise unremarkable in appearance. Pancreas: No pancreatic mass. No pancreatic ductal dilatation. No pancreatic or peripancreatic fluid collections or inflammatory changes. Spleen: Unremarkable. Adrenals/Urinary Tract: Left kidney and bilateral adrenal  glands are normal in appearance. In the lower pole of the right kidney there is an exophytic low-attenuation nonenhancing lesion measuring 8 cm in diameter, compatible with a simple cyst. No hydroureteronephrosis. Amorphous mass-like area of enhancement along the posterior wall of the urinary bladder measuring approximately 6.4 x 2.0 x 2.5 cm (axial image 214 of series 6 and sagittal image 111 of series 9). Stomach/Bowel: Appearance of the stomach is normal. No pathologic dilatation of small bowel or colon. A few scattered colonic diverticulae are noted, without definite surrounding inflammatory changes to suggest an acute diverticulitis (study is limited by presence of small volume of ascites). Normal appendix. Vascular/Lymphatic: Aortic atherosclerosis with vascular findings and measurements pertinent to potential TAVR procedure, as detailed below. No aneurysm or dissection noted in the abdominal or pelvic vasculature. No lymphadenopathy noted in the abdomen or pelvis. Reproductive: Prostate gland and seminal vesicles are unremarkable in appearance. Other: Small volume  of ascites.  No pneumoperitoneum. Musculoskeletal: There are no aggressive appearing lytic or blastic lesions noted in the visualized portions of the skeleton. Mild diffuse body wall edema. VASCULAR MEASUREMENTS PERTINENT TO TAVR: AORTA: Minimal Aortic Diameter-16 x 16 mm Severity of Aortic Calcification-mild RIGHT PELVIS: Right Common Iliac Artery - Minimal Diameter-9.0 x 11.1 mm Tortuosity - mild Calcification-minimal Right External Iliac Artery - Minimal Diameter-9.5 x 9.1 mm Tortuosity - mild Calcification-none Right Common Femoral Artery - Minimal Diameter-6.4 x 8.6 mm Tortuosity - mild Calcification-none LEFT PELVIS: Left Common Iliac Artery - Minimal Diameter-11.6 x 9.9 mm Tortuosity - mild Calcification-minimal Left External Iliac Artery - Minimal Diameter-11.5 x 8.8 mm Tortuosity - mild Calcification-none Left Common Femoral Artery -  Minimal Diameter-9.1 x 8.7 mm Tortuosity-mild Calcification-none Review of the MIP images confirms the above findings. IMPRESSION: 1. Vascular findings and measurements pertinent to potential TAVR procedure, as detailed above. 2. Severe thickening calcification of the aortic valve, compatible with reported clinical history of severe aortic stenosis. 3. Cardiomegaly with evidence of mild interstitial pulmonary edema in the lungs and moderate bilateral pleural effusions; imaging findings concerning for congestive heart failure. 4. Amorphous mass-like area of enhancement along the posterior wall of the urinary bladder measuring approximately 6.4 x 2.0 x 2.5 cm. Nonemergent Urologic consultation is strongly recommended in the near future to better evaluate this finding, as the possibility of an infiltrative bladder neoplasm is not excluded. 5. There is also dilatation of the pulmonic trunk (3.6 cm in diameter), concerning for pulmonary arterial hypertension. 6. Morphologic changes in the liver suggesting early cirrhosis. 7. Small volume of ascites. 8. Mild diffuse body wall edema. 9. Additional incidental findings, as above. These results will be called to the ordering clinician or representative by the Radiologist Assistant, and communication documented in the PACS or [employer]. Electronically Signed   By: Vinnie Langton M.D.   On: 07/27/2019 12:47   ECHOCARDIOGRAM COMPLETE  Result Date: 07/21/2019    ECHOCARDIOGRAM REPORT   Patient Name:   JAHEIM CANINO Date of Exam: 07/21/2019 Medical Rec #:  [id number]   Height:       72.0 in Accession #:    [id number]  Weight:       310.0 lb Date of Birth:  [birth year]   BSA:          2.567 m Patient Age:    36 years    BP:           102/73 mmHg Patient Gender: M           HR:           73 bpm. Exam Location:  Inpatient Procedure: 2D Echo Indications:    CHF-Acute Systolic 903.00 / P23.30  History:        Patient has no prior history of Echocardiogram examinations.                  Risk Factors:Obesity. Pleural effusion on right.  Sonographer:    Vikki Ports Turrentine Referring Phys: Twin  1. Left ventricular ejection fraction, by estimation, is 40 to 45%. The left ventricle has mildly decreased function. The left ventricle demonstrates global hypokinesis. There is moderate left ventricular hypertrophy. Left ventricular diastolic parameters are consistent with Grade II diastolic dysfunction (pseudonormalization).  2. Right ventricular systolic function is mildly reduced. The right ventricular size is normal. There is moderately elevated pulmonary artery systolic pressure.  3. Left atrial size was moderately dilated.  4. The mitral valve is abnormal.  Trivial mitral valve regurgitation.  5. Cannot exclude a bicuspid valve. Aortic valve regurgitation is mild. Critical aortic valve stenosis. Aortic valve area, by VTI measures 0.73 cm. Aortic valve mean gradient measures 96.2 mmHg. Peak gradient of 137 mmHg. Aortic valve Vmax measures 5.86 m/s.  6. The inferior vena cava is dilated in size with <50% respiratory variability, suggesting right atrial pressure of 15 mmHg.  7. Moderate pleural effusion in the right lateral region. FINDINGS  Left Ventricle: Left ventricular ejection fraction, by estimation, is 40 to 45%. The left ventricle has mildly decreased function. The left ventricle demonstrates global hypokinesis. The left ventricular internal cavity size was normal in size. There is  moderate left ventricular hypertrophy. Left ventricular diastolic parameters are consistent with Grade II diastolic dysfunction (pseudonormalization). Indeterminate filling pressures. Right Ventricle: The right ventricular size is normal. No increase in right ventricular wall thickness. Right ventricular systolic function is mildly reduced. There is moderately elevated pulmonary artery systolic pressure. The tricuspid regurgitant velocity is 3.35 m/s, and with an assumed right atrial  pressure of 15 mmHg, the estimated right ventricular systolic pressure is 76.7 mmHg. Left Atrium: Left atrial size was moderately dilated. Right Atrium: Right atrial size was normal in size. Pericardium: There is no evidence of pericardial effusion. Mitral Valve: The mitral valve is abnormal. Mild to moderate mitral annular calcification. Trivial mitral valve regurgitation. Tricuspid Valve: The tricuspid valve is grossly normal. Tricuspid valve regurgitation is mild. Aortic Valve: The aortic valve is tricuspid. . There is moderate thickening and severe calcifcation of the aortic valve. Aortic valve regurgitation is mild. Aortic regurgitation PHT measures 374 msec. Severe aortic stenosis is present. There is moderate thickening of the aortic valve. There is severe calcifcation of the aortic valve. Aortic valve mean gradient measures 96.2 mmHg. Aortic valve peak gradient measures 137.3 mmHg. Aortic valve area, by VTI measures 0.73 cm. Pulmonic Valve: The pulmonic valve was normal in structure. Pulmonic valve regurgitation is not visualized. Aorta: The aortic root and ascending aorta are structurally normal, with no evidence of dilitation. Venous: The inferior vena cava is dilated in size with less than 50% respiratory variability, suggesting right atrial pressure of 15 mmHg. IAS/Shunts: No atrial level shunt detected by color flow Doppler. Additional Comments: There is a moderate pleural effusion in the right lateral region.  LEFT VENTRICLE PLAX 2D LVIDd:         5.50 cm  Diastology LVIDs:         4.40 cm  LV e' lateral:   9.23 cm/s LV PW:         1.40 cm  LV E/e' lateral: 10.2 LV IVS:        1.40 cm  LV e' medial:    7.05 cm/s LVOT diam:     2.50 cm  LV E/e' medial:  13.4 LV SV:         108 LV SV Index:   42 LVOT Area:     4.91 cm  RIGHT VENTRICLE RV S prime:     7.20 cm/s TAPSE (M-mode): 1.2 cm LEFT ATRIUM              Index       RIGHT ATRIUM           Index LA diam:        5.90 cm  2.30 cm/m  RA Area:     22.60  cm LA Vol (A2C):   121.0 ml 47.14 ml/m RA Volume:   68.80 ml  26.80 ml/m LA Vol (A4C):   95.5 ml  37.21 ml/m LA Biplane Vol: 110.0 ml 42.86 ml/m  AORTIC VALVE AV Area (Vmax):    0.82 cm AV Area (Vmean):   0.72 cm AV Area (VTI):     0.73 cm AV Vmax:           585.80 cm/s AV Vmean:          474.000 cm/s AV VTI:            1.480 m AV Peak Grad:      137.3 mmHg AV Mean Grad:      96.2 mmHg LVOT Vmax:         97.30 cm/s LVOT Vmean:        69.800 cm/s LVOT VTI:          0.220 m LVOT/AV VTI ratio: 0.15 AI PHT:            374 msec  AORTA Ao Root diam: 3.00 cm MITRAL VALVE               TRICUSPID VALVE MV Area (PHT): 5.54 cm    TR Peak grad:   44.9 mmHg MV Decel Time: 137 msec    TR Vmax:        335.00 cm/s MV E velocity: 94.30 cm/s MV A velocity: 46.30 cm/s  SHUNTS MV E/A ratio:  2.04        Systemic VTI:  0.22 m                            Systemic Diam: 2.50 cm Lyman Bishop MD Electronically signed by Lyman Bishop MD Signature Date/Time: 07/21/2019/3:42:23 PM    Final    VAS US DOPPLER PRE CABG  Result Date: 08/04/2019 PREOPERATIVE VASCULAR EVALUATION  Indications: Pre AVR. Performing Technologist: Antonieta Pert RDMS, RVT  Examination Guidelines: A complete evaluation includes B-mode imaging, spectral Doppler, color Doppler, and power Doppler as needed of all accessible portions of each vessel. Bilateral testing is considered an integral part of a complete examination. Limited examinations for reoccurring indications may be performed as noted.  ABI Findings: +--------+------------------+-----+---------+--------+ Right   Rt Pressure (mmHg)IndexWaveform Comment  +--------+------------------+-----+---------+--------+ [id number]                    triphasic         +--------+------------------+-----+---------+--------+ +--------+------------------+-----+---------+--------------------+ Left    Lt Pressure (mmHg)IndexWaveform Comment               +--------+------------------+-----+---------+--------------------+ Brachial                       triphasicrestricted extremity +--------+------------------+-----+---------+--------------------+  Right Doppler Findings: +--------+--------+-----+---------+--------+ Site    PressureIndexDoppler  Comments +--------+--------+-----+---------+--------+ [id number]          triphasic         +--------+--------+-----+---------+--------+ Radial               triphasic         +--------+--------+-----+---------+--------+ Ulnar                triphasic         +--------+--------+-----+---------+--------+  Left Doppler Findings: +--------+--------+-----+---------+--------------------+ Site    PressureIndexDoppler  Comments             +--------+--------+-----+---------+--------------------+ Brachial             triphasicrestricted extremity +--------+--------+-----+---------+--------------------+ Radial  triphasic                     +--------+--------+-----+---------+--------------------+ Ulnar                triphasic                     +--------+--------+-----+---------+--------------------+  Right Upper Extremity: Doppler waveforms remain within normal limits with right radial compression. Doppler waveform obliterate with right ulnar compression. Left Upper Extremity: Doppler waveforms remain within normal limits with left radial compression. Doppler waveform obliterate with left ulnar compression.  Electronically signed by Deitra Mayo MD on 08/04/2019 at 6:03:52 PM.   Final    VAS US CAROTID  Result Date: 07/27/2019 Carotid Arterial Duplex Study Indications:       Covid-19. Other Factors:     Critical Aortic Stenosis, CHF, Morbid Obesity. Limitations        Today's exam was limited due to the body habitus of the                    patient. Comparison Study:  No prior study on file for comparison Performing Technologist: Sharion Dove RVS   Examination Guidelines: A complete evaluation includes B-mode imaging, spectral Doppler, color Doppler, and power Doppler as needed of all accessible portions of each vessel. Bilateral testing is considered an integral part of a complete examination. Limited examinations for reoccurring indications may be performed as noted.  Right Carotid Findings: +----------+--------+--------+--------+------------------+------------------+           PSV cm/sEDV cm/sStenosisPlaque DescriptionComments           +----------+--------+--------+--------+------------------+------------------+ CCA Prox  41      7                                 intimal thickening +----------+--------+--------+--------+------------------+------------------+ CCA Distal51      12                                intimal thickening +----------+--------+--------+--------+------------------+------------------+ ICA Prox  42      12              heterogenous                         +----------+--------+--------+--------+------------------+------------------+ ICA Distal48      15                                                   +----------+--------+--------+--------+------------------+------------------+ ECA       62      10                                                   +----------+--------+--------+--------+------------------+------------------+ +----------+--------+-------+--------+-------------------+           PSV cm/sEDV cmsDescribeArm Pressure (mmHG) +----------+--------+-------+--------+-------------------+ [id number]                                         +----------+--------+-------+--------+-------------------+ +---------+--------+--+--------+-+  VertebralPSV cm/s28EDV cm/s9 +---------+--------+--+--------+-+  Left Carotid Findings: +----------+--------+--------+--------+------------------+------------------+           PSV cm/sEDV cm/sStenosisPlaque DescriptionComments            +----------+--------+--------+--------+------------------+------------------+ CCA Prox  73      12                                intimal thickening +----------+--------+--------+--------+------------------+------------------+ CCA Distal43      9                                 intimal thickening +----------+--------+--------+--------+------------------+------------------+ ICA Prox  54      11                                                   +----------+--------+--------+--------+------------------+------------------+ ICA Distal67      19                                                   +----------+--------+--------+--------+------------------+------------------+ ECA       53      5                                                    +----------+--------+--------+--------+------------------+------------------+ +----------+--------+--------+--------+-------------------+           PSV cm/sEDV cm/sDescribeArm Pressure (mmHG) +----------+--------+--------+--------+-------------------+ Subclavian113                                         +----------+--------+--------+--------+-------------------+ +---------+--------+--+--------+--+ VertebralPSV cm/s75EDV cm/s20 +---------+--------+--+--------+--+   Summary: Right Carotid: The extracranial vessels were near-normal with only minimal wall                thickening or plaque. Left Carotid: The extracranial vessels were near-normal with only minimal wall               thickening or plaque. Vertebrals:  Bilateral vertebral arteries demonstrate antegrade flow. Subclavians: Normal flow hemodynamics were seen in bilateral subclavian              arteries. *See table(s) above for measurements and observations.  Electronically signed by Servando Snare MD on 07/27/2019 at 12:53:06 AM.    Final    VAS [place] LOWER EXTREMITY VENOUS (DVT)  Result Date: 07/23/2019  Lower Venous DVTStudy Indications: Edema, and elevated ddimer.   Limitations: Body habitus and poor ultrasound/tissue interface. Comparison Study: no prior Performing Technologist: Abram Sander RVS  Examination Guidelines: A complete evaluation includes B-mode imaging, spectral Doppler, color Doppler, and power Doppler as needed of all accessible portions of each vessel. Bilateral testing is considered an integral part of a complete examination. Limited examinations for reoccurring indications may be performed as noted. The reflux portion of the exam is performed with the patient in reverse Trendelenburg.  +---------+---------------+---------+-----------+----------+--------------+ RIGHT    CompressibilityPhasicitySpontaneityPropertiesThrombus Aging +---------+---------------+---------+-----------+----------+--------------+ CFV  Full           Yes      Yes                                 +---------+---------------+---------+-----------+----------+--------------+ SFJ      Full                                                        +---------+---------------+---------+-----------+----------+--------------+ FV Prox  Full                                                        +---------+---------------+---------+-----------+----------+--------------+ FV Mid                  Yes      Yes                                 +---------+---------------+---------+-----------+----------+--------------+ FV Distal               Yes      Yes                                 +---------+---------------+---------+-----------+----------+--------------+ PFV      Full                                                        +---------+---------------+---------+-----------+----------+--------------+ POP      Full           Yes      Yes                                 +---------+---------------+---------+-----------+----------+--------------+ PTV      Full                                                         +---------+---------------+---------+-----------+----------+--------------+ PERO     Full                                                        +---------+---------------+---------+-----------+----------+--------------+   +---------+---------------+---------+-----------+----------+--------------+ LEFT     CompressibilityPhasicitySpontaneityPropertiesThrombus Aging +---------+---------------+---------+-----------+----------+--------------+ CFV      Full           Yes      Yes                                 +---------+---------------+---------+-----------+----------+--------------+  SFJ      Full                                                        +---------+---------------+---------+-----------+----------+--------------+ FV Prox  Full                                                        +---------+---------------+---------+-----------+----------+--------------+ FV Mid   Full                                                        +---------+---------------+---------+-----------+----------+--------------+ FV Distal               Yes      Yes                                 +---------+---------------+---------+-----------+----------+--------------+ PFV      Full                                                        +---------+---------------+---------+-----------+----------+--------------+ POP      Full           Yes      Yes                                 +---------+---------------+---------+-----------+----------+--------------+ PTV      Full                                                        +---------+---------------+---------+-----------+----------+--------------+ PERO                                                  Not visualized +---------+---------------+---------+-----------+----------+--------------+     Summary: BILATERAL: - No evidence of deep vein thrombosis seen in the lower extremities, bilaterally. -   *See table(s)  above for measurements and observations. Electronically signed by Monica Martinez MD on 07/23/2019 at 5:31:24 PM.    Final    CT Angio Abd/Pel w/ and/or w/o  Result Date: 07/27/2019 CLINICAL DATA:  54 year old male with history of severe aortic stenosis. Preprocedural study prior to potential transcatheter aortic valve replacement (TAVR) procedure. EXAM: CT ANGIOGRAPHY CHEST, ABDOMEN AND PELVIS TECHNIQUE: Non-contrast CT of the chest was initially obtained. Multidetector CT imaging through the chest, abdomen and pelvis was performed using the standard protocol during bolus administration of intravenous contrast. Multiplanar reconstructed images and MIPs were obtained  and reviewed to evaluate the vascular anatomy. CONTRAST:  122m OMNIPAQUE IOHEXOL 350 MG/ML SOLN COMPARISON:  No priors. FINDINGS: CTA CHEST FINDINGS Cardiovascular: Heart size is mildly enlarged. There is no significant pericardial fluid, thickening or pericardial calcification. Aortic atherosclerosis. No definite coronary artery calcifications. Severe thickening calcification of the aortic valve. Dilatation of the pulmonic trunk (3.6 cm in diameter). Mediastinum/Lymph Nodes: No pathologically enlarged mediastinal or hilar lymph nodes. Esophagus is unremarkable in appearance. No axillary lymphadenopathy. Lungs/Pleura: Patchy but diffuse ground-glass attenuation and mild interlobular septal thickening noted throughout the lungs bilaterally, suggesting a background of mild interstitial pulmonary edema. Moderate bilateral pleural effusions lying dependently with some associated passive subsegmental atelectasis in the lower lobes of the lungs bilaterally. No definite suspicious appearing pulmonary nodules or masses. Musculoskeletal/Soft Tissues: Old healed fracture of the mid sternum with mild posttraumatic deformity. There are no aggressive appearing lytic or blastic lesions noted in the visualized portions of the skeleton. CTA ABDOMEN AND PELVIS  FINDINGS Hepatobiliary: Liver has a slightly shrunken appearance and nodular contour, indicative of underlying cirrhosis. No discrete cystic or solid hepatic lesions. No intra or extrahepatic biliary ductal dilatation. Gallbladder is nearly completely decompressed, and otherwise unremarkable in appearance. Pancreas: No pancreatic mass. No pancreatic ductal dilatation. No pancreatic or peripancreatic fluid collections or inflammatory changes. Spleen: Unremarkable. Adrenals/Urinary Tract: Left kidney and bilateral adrenal glands are normal in appearance. In the lower pole of the right kidney there is an exophytic low-attenuation nonenhancing lesion measuring 8 cm in diameter, compatible with a simple cyst. No hydroureteronephrosis. Amorphous mass-like area of enhancement along the posterior wall of the urinary bladder measuring approximately 6.4 x 2.0 x 2.5 cm (axial image 214 of series 6 and sagittal image 111 of series 9). Stomach/Bowel: Appearance of the stomach is normal. No pathologic dilatation of small bowel or colon. A few scattered colonic diverticulae are noted, without definite surrounding inflammatory changes to suggest an acute diverticulitis (study is limited by presence of small volume of ascites). Normal appendix. Vascular/Lymphatic: Aortic atherosclerosis with vascular findings and measurements pertinent to potential TAVR procedure, as detailed below. No aneurysm or dissection noted in the abdominal or pelvic vasculature. No lymphadenopathy noted in the abdomen or pelvis. Reproductive: Prostate gland and seminal vesicles are unremarkable in appearance. Other: Small volume of ascites.  No pneumoperitoneum. Musculoskeletal: There are no aggressive appearing lytic or blastic lesions noted in the visualized portions of the skeleton. Mild diffuse body wall edema. VASCULAR MEASUREMENTS PERTINENT TO TAVR: AORTA: Minimal Aortic Diameter-16 x 16 mm Severity of Aortic Calcification-mild RIGHT PELVIS: Right  Common Iliac Artery - Minimal Diameter-9.0 x 11.1 mm Tortuosity - mild Calcification-minimal Right External Iliac Artery - Minimal Diameter-9.5 x 9.1 mm Tortuosity - mild Calcification-none Right Common Femoral Artery - Minimal Diameter-6.4 x 8.6 mm Tortuosity - mild Calcification-none LEFT PELVIS: Left Common Iliac Artery - Minimal Diameter-11.6 x 9.9 mm Tortuosity - mild Calcification-minimal Left External Iliac Artery - Minimal Diameter-11.5 x 8.8 mm Tortuosity - mild Calcification-none Left Common Femoral Artery - Minimal Diameter-9.1 x 8.7 mm Tortuosity-mild Calcification-none Review of the MIP images confirms the above findings. IMPRESSION: 1. Vascular findings and measurements pertinent to potential TAVR procedure, as detailed above. 2. Severe thickening calcification of the aortic valve, compatible with reported clinical history of severe aortic stenosis. 3. Cardiomegaly with evidence of mild interstitial pulmonary edema in the lungs and moderate bilateral pleural effusions; imaging findings concerning for congestive heart failure. 4. Amorphous mass-like area of enhancement along the posterior wall of the urinary bladder measuring  approximately 6.4 x 2.0 x 2.5 cm. Nonemergent Urologic consultation is strongly recommended in the near future to better evaluate this finding, as the possibility of an infiltrative bladder neoplasm is not excluded. 5. There is also dilatation of the pulmonic trunk (3.6 cm in diameter), concerning for pulmonary arterial hypertension. 6. Morphologic changes in the liver suggesting early cirrhosis. 7. Small volume of ascites. 8. Mild diffuse body wall edema. 9. Additional incidental findings, as above. These results will be called to the ordering clinician or representative by the Radiologist Assistant, and communication documented in the PACS or [employer]. Electronically Signed   By: Vinnie Langton M.D.   On: 07/27/2019 12:47    Phillips Climes M.D on 08/05/2019 at 12:56  PM   [facility] -  Office  [phone]

## 2019-08-05 NOTE — Progress Notes (Signed)
8 Days Post-Op Procedure(s) (LRB): RIGHT/LEFT HEART CATH AND CORONARY ANGIOGRAPHY (N/A) Subjective: No chest pain or shortness of breath.  Objective: Vital signs in last 24 hours: Temp:  [97.7 F (36.5 C)-98.4 F (36.9 C)] 97.7 F (36.5 C) (06/02 0437) Pulse Rate:  [73-77] 77 (06/02 0437) Cardiac Rhythm: Normal sinus rhythm (06/02 0700) Resp:  [20] 20 (06/02 0437) BP: (97)/(72-74) 97/74 (06/01 2029) SpO2:  [90 %-97 %] 96 % (06/02 0437) Weight:  [119.6 kg] 119.6 kg (06/02 0437)  Hemodynamic parameters for last 24 hours:    Intake/Output from previous day: 06/01 0701 - 06/02 0700 In: 120 [P.O.:120] Out: 2725 [Urine:2725] Intake/Output this shift: No intake/output data recorded.  General appearance: alert and cooperative Neurologic: intact Heart: regular rate and rhythm and 3/6 systolic murmur Lungs: clear to auscultation bilaterally Extremities: edema moderate LE edema. Legs have Unna boots on.  Lab Results: Recent Labs    08/04/19 0354 08/05/19 0248  WBC 7.3 7.2  HGB 13.0 13.3  HCT 41.3 42.4  PLT 204 198   BMET:  Recent Labs    08/04/19 0354 08/05/19 0248  NA 135 136  K 4.4 4.5  CL 99 98  CO2 28 29  GLUCOSE 122* 131*  BUN 16 17  CREATININE 0.87 0.86  CALCIUM 8.9 9.1    PT/INR:  Recent Labs    08/03/19 0359  LABPROT 13.6  INR 1.1   ABG    Component Value Date/Time   PHART 7.400 07/28/2019 1257   HCO3 34.2 (H) 07/28/2019 1257   TCO2 36 (H) 07/28/2019 1257   O2SAT 93.0 07/28/2019 1257   CBG (last 3)  No results for input(s): GLUCAP in the last 72 hours.  Assessment/Plan:  Critical Aortic stenosis.  Plan AVR using a bioprosthetic aortic valve in am. Patient has no further questions.  LOS: 15 days    Gaye Pollack 08/05/2019

## 2019-08-05 NOTE — Anesthesia Preprocedure Evaluation (Addendum)
Anesthesia Evaluation  Patient identified by MRN, date of birth, ID band Patient awake    Reviewed: Allergy & Precautions, NPO status , Patient's Chart, lab work & pertinent test results, Unable to perform ROS - Chart review only  Airway Mallampati: II  TM Distance: >3 FB Neck ROM: Full    Dental  (+) Dental Advisory Given, Missing   Pulmonary neg pulmonary ROS,    Pulmonary exam normal breath sounds clear to auscultation       Cardiovascular +CHF  + Valvular Problems/Murmurs AS  Rhythm:Regular Rate:Normal + Systolic murmurs Echo 4/40/3474 1. Left ventricular ejection fraction, by estimation, is 40 to 45%. The left ventricle has mildly decreased function. The left ventricle demonstrates global hypokinesis. There is moderate left ventricular hypertrophy. Left ventricular diastolic parameters are consistent with Grade II diastolic dysfunction (pseudonormalization).  2. Right ventricular systolic function is mildly reduced. The right ventricular size is normal. There is moderately elevated pulmonary artery systolic pressure.  3. Left atrial size was moderately dilated.  4. The mitral valve is abnormal. Trivial mitral valve regurgitation.  5. Cannot exclude a bicuspid valve. Aortic valve regurgitation is mild. Critical aortic valve stenosis. Aortic valve area, by VTI measures 0.73 cm. Aortic valve mean gradient measures 96.2 mmHg. Peak gradient of 137  mmHg. Aortic valve Vmax measures 5.86 m/s.  6. The inferior vena cava is dilated in size with <50% respiratory variability, suggesting right atrial pressure of 15 mmHg.  7. Moderate pleural effusion in the right lateral region.   Neuro/Psych negative neurological ROS  negative psych ROS   GI/Hepatic negative GI ROS, Neg liver ROS,   Endo/Other  negative endocrine ROS  Renal/GU negative Renal ROS     Musculoskeletal negative musculoskeletal ROS (+)   Abdominal (+) + obese,    Peds  Hematology negative hematology ROS (+)   Anesthesia Other Findings   Reproductive/Obstetrics                            Anesthesia Physical Anesthesia Plan  ASA: IV  Anesthesia Plan: General   Post-op Pain Management:    Induction: Intravenous  PONV Risk Score and Plan: 3 and Ondansetron, Treatment may vary due to age or medical condition and Midazolam  Airway Management Planned: Oral ETT  Additional Equipment: Arterial line, CVP, PA Cath, TEE, 3D TEE and Ultrasound Guidance Line Placement  Intra-op Plan: Utilization Of Total Body Hypothermia per surgeon request  Post-operative Plan: Post-operative intubation/ventilation  Informed Consent: I have reviewed the patients History and Physical, chart, labs and discussed the procedure including the risks, benefits and alternatives for the proposed anesthesia with the patient or authorized representative who has indicated his/her understanding and acceptance.     Dental advisory given  Plan Discussed with: CRNA  Anesthesia Plan Comments:         Anesthesia Quick Evaluation

## 2019-08-06 ENCOUNTER — Inpatient Hospital Stay (HOSPITAL_COMMUNITY): Payer: 59

## 2019-08-06 ENCOUNTER — Inpatient Hospital Stay (HOSPITAL_COMMUNITY): Payer: 59 | Admitting: Anesthesiology

## 2019-08-06 ENCOUNTER — Encounter (HOSPITAL_COMMUNITY)
Admission: EM | Disposition: A | Discharge status: Home Health | Payer: Self-pay | Source: Home / Self Care | Attending: Surgery

## 2019-08-06 ENCOUNTER — Inpatient Hospital Stay (HOSPITAL_COMMUNITY)
Admission: EM | Disposition: A | Discharge status: Home Health | Payer: Self-pay | Source: Home / Self Care | Attending: Surgery

## 2019-08-06 ENCOUNTER — Inpatient Hospital Stay (HOSPITAL_COMMUNITY): Payer: 59 | Admitting: Certified Registered Nurse Anesthetist

## 2019-08-06 DIAGNOSIS — I350 Nonrheumatic aortic (valve) stenosis: Secondary | ICD-10-CM

## 2019-08-06 DIAGNOSIS — Z953 Presence of xenogenic heart valve: Secondary | ICD-10-CM

## 2019-08-06 DIAGNOSIS — I9789 Other postprocedural complications and disorders of the circulatory system, not elsewhere classified: Secondary | ICD-10-CM

## 2019-08-06 HISTORY — PX: TEE WITHOUT CARDIOVERSION: SHX5443

## 2019-08-06 HISTORY — DX: Presence of xenogenic heart valve: Z95.3

## 2019-08-06 HISTORY — PX: AORTIC VALVE REPLACEMENT: SHX41

## 2019-08-06 HISTORY — PX: EXPLORATION POST OPERATIVE OPEN HEART: SHX5061

## 2019-08-06 LAB — POCT I-STAT 7, (LYTES, BLD GAS, ICA,H+H)
Acid-Base Excess: 1 mmol/L (ref 0.0–2.0)
Acid-Base Excess: 1 mmol/L (ref 0.0–2.0)
Acid-Base Excess: 3 mmol/L — ABNORMAL HIGH (ref 0.0–2.0)
Acid-Base Excess: 4 mmol/L — ABNORMAL HIGH (ref 0.0–2.0)
Acid-Base Excess: 6 mmol/L — ABNORMAL HIGH (ref 0.0–2.0)
Acid-base deficit: 1 mmol/L (ref 0.0–2.0)
Acid-base deficit: 3 mmol/L — ABNORMAL HIGH (ref 0.0–2.0)
Acid-base deficit: 2 mmol/L (ref 0.0–2.0)
Acid-base deficit: 2 mmol/L (ref 0.0–2.0)
Acid-base deficit: 3 mmol/L — ABNORMAL HIGH (ref 0.0–2.0)
Bicarbonate: 26.9 mmol/L (ref 20.0–28.0)
Bicarbonate: 28.3 mmol/L — ABNORMAL HIGH (ref 20.0–28.0)
Bicarbonate: 28 mmol/L (ref 20.0–28.0)
Bicarbonate: 29.3 mmol/L — ABNORMAL HIGH (ref 20.0–28.0)
Bicarbonate: 29.7 mmol/L — ABNORMAL HIGH (ref 20.0–28.0)
Bicarbonate: 26.1 mmol/L (ref 20.0–28.0)
Bicarbonate: 25.8 mmol/L (ref 20.0–28.0)
Bicarbonate: 26.1 mmol/L (ref 20.0–28.0)
Bicarbonate: 23.8 mmol/L (ref 20.0–28.0)
Bicarbonate: 22.4 mmol/L (ref 20.0–28.0)
Calcium, Ion: 1.23 mmol/L (ref 1.15–1.40)
Calcium, Ion: 1.25 mmol/L (ref 1.15–1.40)
Calcium, Ion: 1.08 mmol/L — ABNORMAL LOW (ref 1.15–1.40)
Calcium, Ion: 1.1 mmol/L — ABNORMAL LOW (ref 1.15–1.40)
Calcium, Ion: 1.1 mmol/L — ABNORMAL LOW (ref 1.15–1.40)
Calcium, Ion: 1.13 mmol/L — ABNORMAL LOW (ref 1.15–1.40)
Calcium, Ion: 1.15 mmol/L (ref 1.15–1.40)
Calcium, Ion: 1.12 mmol/L — ABNORMAL LOW (ref 1.15–1.40)
Calcium, Ion: 1.11 mmol/L — ABNORMAL LOW (ref 1.15–1.40)
Calcium, Ion: 1.12 mmol/L — ABNORMAL LOW (ref 1.15–1.40)
HCT: 43 % (ref 39.0–52.0)
HCT: 44 % (ref 39.0–52.0)
HCT: 34 % — ABNORMAL LOW (ref 39.0–52.0)
HCT: 30 % — ABNORMAL LOW (ref 39.0–52.0)
HCT: 27 % — ABNORMAL LOW (ref 39.0–52.0)
HCT: 33 % — ABNORMAL LOW (ref 39.0–52.0)
HCT: 40 % (ref 39.0–52.0)
HCT: 40 % (ref 39.0–52.0)
HCT: 37 % — ABNORMAL LOW (ref 39.0–52.0)
HCT: 27 % — ABNORMAL LOW (ref 39.0–52.0)
Hemoglobin: 14.6 g/dL (ref 13.0–17.0)
Hemoglobin: 15 g/dL (ref 13.0–17.0)
Hemoglobin: 11.6 g/dL — ABNORMAL LOW (ref 13.0–17.0)
Hemoglobin: 10.2 g/dL — ABNORMAL LOW (ref 13.0–17.0)
Hemoglobin: 9.2 g/dL — ABNORMAL LOW (ref 13.0–17.0)
Hemoglobin: 11.2 g/dL — ABNORMAL LOW (ref 13.0–17.0)
Hemoglobin: 13.6 g/dL (ref 13.0–17.0)
Hemoglobin: 13.6 g/dL (ref 13.0–17.0)
Hemoglobin: 12.6 g/dL — ABNORMAL LOW (ref 13.0–17.0)
Hemoglobin: 9.2 g/dL — ABNORMAL LOW (ref 13.0–17.0)
O2 Saturation: 90 %
O2 Saturation: 91 %
O2 Saturation: 100 %
O2 Saturation: 100 %
O2 Saturation: 100 %
O2 Saturation: 99 %
O2 Saturation: 94 %
O2 Saturation: 98 %
O2 Saturation: 98 %
O2 Saturation: 99 %
Patient temperature: 36.5
Patient temperature: 36.5
Patient temperature: 36.5
Potassium: 4.7 mmol/L (ref 3.5–5.1)
Potassium: 5 mmol/L (ref 3.5–5.1)
Potassium: 4 mmol/L (ref 3.5–5.1)
Potassium: 4.5 mmol/L (ref 3.5–5.1)
Potassium: 5.2 mmol/L — ABNORMAL HIGH (ref 3.5–5.1)
Potassium: 4 mmol/L (ref 3.5–5.1)
Potassium: 4 mmol/L (ref 3.5–5.1)
Potassium: 4.1 mmol/L (ref 3.5–5.1)
Potassium: 4.1 mmol/L (ref 3.5–5.1)
Potassium: 3.9 mmol/L (ref 3.5–5.1)
Sodium: 138 mmol/L (ref 135–145)
Sodium: 135 mmol/L (ref 135–145)
Sodium: 137 mmol/L (ref 135–145)
Sodium: 136 mmol/L (ref 135–145)
Sodium: 136 mmol/L (ref 135–145)
Sodium: 139 mmol/L (ref 135–145)
Sodium: 140 mmol/L (ref 135–145)
Sodium: 141 mmol/L (ref 135–145)
Sodium: 141 mmol/L (ref 135–145)
Sodium: 142 mmol/L (ref 135–145)
TCO2: 28 mmol/L (ref 22–32)
TCO2: 30 mmol/L (ref 22–32)
TCO2: 29 mmol/L (ref 22–32)
TCO2: 31 mmol/L (ref 22–32)
TCO2: 31 mmol/L (ref 22–32)
TCO2: 28 mmol/L (ref 22–32)
TCO2: 28 mmol/L (ref 22–32)
TCO2: 28 mmol/L (ref 22–32)
TCO2: 25 mmol/L (ref 22–32)
TCO2: 24 mmol/L (ref 22–32)
pCO2 arterial: 47.2 mmHg (ref 32.0–48.0)
pCO2 arterial: 56.5 mmHg — ABNORMAL HIGH (ref 32.0–48.0)
pCO2 arterial: 44.4 mmHg (ref 32.0–48.0)
pCO2 arterial: 44.4 mmHg (ref 32.0–48.0)
pCO2 arterial: 39.3 mmHg (ref 32.0–48.0)
pCO2 arterial: 51.4 mmHg — ABNORMAL HIGH (ref 32.0–48.0)
pCO2 arterial: 58.2 mmHg — ABNORMAL HIGH (ref 32.0–48.0)
pCO2 arterial: 53.9 mmHg — ABNORMAL HIGH (ref 32.0–48.0)
pCO2 arterial: 41.1 mmHg (ref 32.0–48.0)
pCO2 arterial: 38.1 mmHg (ref 32.0–48.0)
pH, Arterial: 7.364 (ref 7.350–7.450)
pH, Arterial: 7.308 — ABNORMAL LOW (ref 7.350–7.450)
pH, Arterial: 7.408 (ref 7.350–7.450)
pH, Arterial: 7.428 (ref 7.350–7.450)
pH, Arterial: 7.486 — ABNORMAL HIGH (ref 7.350–7.450)
pH, Arterial: 7.314 — ABNORMAL LOW (ref 7.350–7.450)
pH, Arterial: 7.251 — ABNORMAL LOW (ref 7.350–7.450)
pH, Arterial: 7.29 — ABNORMAL LOW (ref 7.350–7.450)
pH, Arterial: 7.368 (ref 7.350–7.450)
pH, Arterial: 7.377 (ref 7.350–7.450)
pO2, Arterial: 61 mmHg — ABNORMAL LOW (ref 83.0–108.0)
pO2, Arterial: 68 mmHg — ABNORMAL LOW (ref 83.0–108.0)
pO2, Arterial: 348 mmHg — ABNORMAL HIGH (ref 83.0–108.0)
pO2, Arterial: 397 mmHg — ABNORMAL HIGH (ref 83.0–108.0)
pO2, Arterial: 343 mmHg — ABNORMAL HIGH (ref 83.0–108.0)
pO2, Arterial: 144 mmHg — ABNORMAL HIGH (ref 83.0–108.0)
pO2, Arterial: 84 mmHg (ref 83.0–108.0)
pO2, Arterial: 109 mmHg — ABNORMAL HIGH (ref 83.0–108.0)
pO2, Arterial: 110 mmHg — ABNORMAL HIGH (ref 83.0–108.0)
pO2, Arterial: 141 mmHg — ABNORMAL HIGH (ref 83.0–108.0)

## 2019-08-06 LAB — BLOOD GAS, ARTERIAL
Acid-Base Excess: 5.6 mmol/L — ABNORMAL HIGH (ref 0.0–2.0)
Bicarbonate: 30.6 mmol/L — ABNORMAL HIGH (ref 20.0–28.0)
Drawn by: 56002
FIO2: 28
O2 Saturation: 23.4 %
Patient temperature: 36.8
pCO2 arterial: 53.8 mmHg — ABNORMAL HIGH (ref 32.0–48.0)
pH, Arterial: 7.372 (ref 7.350–7.450)
pO2, Arterial: <31 mmHg — CL (ref 83.0–108.0)

## 2019-08-06 LAB — BASIC METABOLIC PANEL
Anion gap: 12 (ref 5–15)
BUN: 14 mg/dL (ref 6–20)
CO2: 21 mmol/L — ABNORMAL LOW (ref 22–32)
Calcium: 7.5 mg/dL — ABNORMAL LOW (ref 8.9–10.3)
Chloride: 104 mmol/L (ref 98–111)
Creatinine, Ser: 0.94 mg/dL (ref 0.61–1.24)
GFR calc Af Amer: >60 mL/min (ref >60)
GFR calc non Af Amer: >60 mL/min (ref >60)
Glucose, Bld: 163 mg/dL — ABNORMAL HIGH (ref 70–99)
Potassium: 4 mmol/L (ref 3.5–5.1)
Sodium: 137 mmol/L (ref 135–145)

## 2019-08-06 LAB — CBC
HCT: 40.5 % (ref 39.0–52.0)
HCT: 37.7 % — ABNORMAL LOW (ref 39.0–52.0)
HCT: 34.8 % — ABNORMAL LOW (ref 39.0–52.0)
HCT: 27.3 % — ABNORMAL LOW (ref 39.0–52.0)
Hemoglobin: 12.6 g/dL — ABNORMAL LOW (ref 13.0–17.0)
Hemoglobin: 12 g/dL — ABNORMAL LOW (ref 13.0–17.0)
Hemoglobin: 11.2 g/dL — ABNORMAL LOW (ref 13.0–17.0)
Hemoglobin: 8.7 g/dL — ABNORMAL LOW (ref 13.0–17.0)
MCH: 29.8 pg (ref 26.0–34.0)
MCH: 30.2 pg (ref 26.0–34.0)
MCH: 30.5 pg (ref 26.0–34.0)
MCH: 30.2 pg (ref 26.0–34.0)
MCHC: 31.1 g/dL (ref 30.0–36.0)
MCHC: 31.8 g/dL (ref 30.0–36.0)
MCHC: 32.2 g/dL (ref 30.0–36.0)
MCHC: 31.9 g/dL (ref 30.0–36.0)
MCV: 95.7 fL (ref 80.0–100.0)
MCV: 95 fL (ref 80.0–100.0)
MCV: 94.8 fL (ref 80.0–100.0)
MCV: 94.8 fL (ref 80.0–100.0)
Platelets: 194 10*3/uL (ref 150–400)
Platelets: 196 10*3/uL (ref 150–400)
Platelets: 179 10*3/uL (ref 150–400)
Platelets: 162 10*3/uL (ref 150–400)
RBC: 4.23 MIL/uL (ref 4.22–5.81)
RBC: 3.97 MIL/uL — ABNORMAL LOW (ref 4.22–5.81)
RBC: 3.67 MIL/uL — ABNORMAL LOW (ref 4.22–5.81)
RBC: 2.88 MIL/uL — ABNORMAL LOW (ref 4.22–5.81)
RDW: 17.1 % — ABNORMAL HIGH (ref 11.5–15.5)
RDW: 16.4 % — ABNORMAL HIGH (ref 11.5–15.5)
RDW: 16.2 % — ABNORMAL HIGH (ref 11.5–15.5)
RDW: 16.1 % — ABNORMAL HIGH (ref 11.5–15.5)
WBC: 7.2 10*3/uL (ref 4.0–10.5)
WBC: 22.4 10*3/uL — ABNORMAL HIGH (ref 4.0–10.5)
WBC: 20.6 10*3/uL — ABNORMAL HIGH (ref 4.0–10.5)
WBC: 16.6 10*3/uL — ABNORMAL HIGH (ref 4.0–10.5)
nRBC: 0 % (ref 0.0–0.2)
nRBC: 0.1 % (ref 0.0–0.2)
nRBC: 0.1 % (ref 0.0–0.2)
nRBC: 0.1 % (ref 0.0–0.2)

## 2019-08-06 LAB — POCT I-STAT, CHEM 8
BUN: 21 mg/dL — ABNORMAL HIGH (ref 6–20)
BUN: 20 mg/dL (ref 6–20)
BUN: 19 mg/dL (ref 6–20)
BUN: 19 mg/dL (ref 6–20)
BUN: 19 mg/dL (ref 6–20)
Calcium, Ion: 1.18 mmol/L (ref 1.15–1.40)
Calcium, Ion: 1.19 mmol/L (ref 1.15–1.40)
Calcium, Ion: 1.11 mmol/L — ABNORMAL LOW (ref 1.15–1.40)
Calcium, Ion: 1.09 mmol/L — ABNORMAL LOW (ref 1.15–1.40)
Calcium, Ion: 1.12 mmol/L — ABNORMAL LOW (ref 1.15–1.40)
Chloride: 99 mmol/L (ref 98–111)
Chloride: 99 mmol/L (ref 98–111)
Chloride: 98 mmol/L (ref 98–111)
Chloride: 99 mmol/L (ref 98–111)
Chloride: 100 mmol/L (ref 98–111)
Creatinine, Ser: 0.8 mg/dL (ref 0.61–1.24)
Creatinine, Ser: 0.8 mg/dL (ref 0.61–1.24)
Creatinine, Ser: 0.8 mg/dL (ref 0.61–1.24)
Creatinine, Ser: 0.6 mg/dL — ABNORMAL LOW (ref 0.61–1.24)
Creatinine, Ser: 0.7 mg/dL (ref 0.61–1.24)
Glucose, Bld: 140 mg/dL — ABNORMAL HIGH (ref 70–99)
Glucose, Bld: 148 mg/dL — ABNORMAL HIGH (ref 70–99)
Glucose, Bld: 139 mg/dL — ABNORMAL HIGH (ref 70–99)
Glucose, Bld: 137 mg/dL — ABNORMAL HIGH (ref 70–99)
Glucose, Bld: 139 mg/dL — ABNORMAL HIGH (ref 70–99)
HCT: 44 % (ref 39.0–52.0)
HCT: 39 % (ref 39.0–52.0)
HCT: 28 % — ABNORMAL LOW (ref 39.0–52.0)
HCT: 28 % — ABNORMAL LOW (ref 39.0–52.0)
HCT: 33 % — ABNORMAL LOW (ref 39.0–52.0)
Hemoglobin: 15 g/dL (ref 13.0–17.0)
Hemoglobin: 13.3 g/dL (ref 13.0–17.0)
Hemoglobin: 9.5 g/dL — ABNORMAL LOW (ref 13.0–17.0)
Hemoglobin: 9.5 g/dL — ABNORMAL LOW (ref 13.0–17.0)
Hemoglobin: 11.2 g/dL — ABNORMAL LOW (ref 13.0–17.0)
Potassium: 5 mmol/L (ref 3.5–5.1)
Potassium: 4.1 mmol/L (ref 3.5–5.1)
Potassium: 4.6 mmol/L (ref 3.5–5.1)
Potassium: 4.9 mmol/L (ref 3.5–5.1)
Potassium: 4 mmol/L (ref 3.5–5.1)
Sodium: 135 mmol/L (ref 135–145)
Sodium: 136 mmol/L (ref 135–145)
Sodium: 135 mmol/L (ref 135–145)
Sodium: 135 mmol/L (ref 135–145)
Sodium: 138 mmol/L (ref 135–145)
TCO2: 29 mmol/L (ref 22–32)
TCO2: 28 mmol/L (ref 22–32)
TCO2: 32 mmol/L (ref 22–32)
TCO2: 30 mmol/L (ref 22–32)
TCO2: 27 mmol/L (ref 22–32)

## 2019-08-06 LAB — COMPREHENSIVE METABOLIC PANEL
ALT: 20 U/L (ref 0–44)
AST: 28 U/L (ref 15–41)
Albumin: 3.3 g/dL — ABNORMAL LOW (ref 3.5–5.0)
Alkaline Phosphatase: 250 U/L — ABNORMAL HIGH (ref 38–126)
Anion gap: 13 (ref 5–15)
BUN: 22 mg/dL — ABNORMAL HIGH (ref 6–20)
CO2: 27 mmol/L (ref 22–32)
Calcium: 8.8 mg/dL — ABNORMAL LOW (ref 8.9–10.3)
Chloride: 97 mmol/L — ABNORMAL LOW (ref 98–111)
Creatinine, Ser: 0.98 mg/dL (ref 0.61–1.24)
GFR calc Af Amer: >60 mL/min (ref >60)
GFR calc non Af Amer: >60 mL/min (ref >60)
Glucose, Bld: 121 mg/dL — ABNORMAL HIGH (ref 70–99)
Potassium: 4.3 mmol/L (ref 3.5–5.1)
Sodium: 137 mmol/L (ref 135–145)
Total Bilirubin: 2.8 mg/dL — ABNORMAL HIGH (ref 0.3–1.2)
Total Protein: 7 g/dL (ref 6.5–8.1)

## 2019-08-06 LAB — URINALYSIS, ROUTINE W REFLEX MICROSCOPIC
Bilirubin Urine: NEGATIVE
Glucose, UA: NEGATIVE mg/dL
Hgb urine dipstick: NEGATIVE
Ketones, ur: NEGATIVE mg/dL
Leukocytes,Ua: NEGATIVE
Nitrite: NEGATIVE
Protein, ur: NEGATIVE mg/dL
Specific Gravity, Urine: 1.011 (ref 1.005–1.030)
pH: 5 (ref 5.0–8.0)

## 2019-08-06 LAB — PROTIME-INR
INR: 1.6 — ABNORMAL HIGH (ref 0.8–1.2)
Prothrombin Time: 18.1 seconds — ABNORMAL HIGH (ref 11.4–15.2)

## 2019-08-06 LAB — HEMOGLOBIN A1C
Hgb A1c MFr Bld: 6.1 % — ABNORMAL HIGH (ref 4.8–5.6)
Mean Plasma Glucose: 128.37 mg/dL

## 2019-08-06 LAB — APTT
aPTT: 38 seconds — ABNORMAL HIGH (ref 24–36)
aPTT: 42 seconds — ABNORMAL HIGH (ref 24–36)

## 2019-08-06 LAB — GLUCOSE, CAPILLARY
Glucose-Capillary: 124 mg/dL — ABNORMAL HIGH (ref 70–99)
Glucose-Capillary: 134 mg/dL — ABNORMAL HIGH (ref 70–99)
Glucose-Capillary: 143 mg/dL — ABNORMAL HIGH (ref 70–99)
Glucose-Capillary: 143 mg/dL — ABNORMAL HIGH (ref 70–99)
Glucose-Capillary: 149 mg/dL — ABNORMAL HIGH (ref 70–99)
Glucose-Capillary: 162 mg/dL — ABNORMAL HIGH (ref 70–99)
Glucose-Capillary: 175 mg/dL — ABNORMAL HIGH (ref 70–99)

## 2019-08-06 LAB — MAGNESIUM
Magnesium: 1.9 mg/dL (ref 1.7–2.4)
Magnesium: 2 mg/dL (ref 1.7–2.4)

## 2019-08-06 LAB — HEMOGLOBIN AND HEMATOCRIT, BLOOD
HCT: 25.9 % — ABNORMAL LOW (ref 39.0–52.0)
Hemoglobin: 8.3 g/dL — ABNORMAL LOW (ref 13.0–17.0)

## 2019-08-06 LAB — SURGICAL PCR SCREEN
MRSA, PCR: NEGATIVE
Staphylococcus aureus: NEGATIVE

## 2019-08-06 LAB — PLATELET COUNT: Platelets: 176 10*3/uL (ref 150–400)

## 2019-08-06 LAB — FIBRINOGEN: Fibrinogen: 317 mg/dL (ref 210–475)

## 2019-08-06 SURGERY — REPLACEMENT, AORTIC VALVE, OPEN
Anesthesia: General | Site: Chest

## 2019-08-06 SURGERY — EXPLORATION POST OPERATIVE OPEN HEART
Anesthesia: General | Site: Chest

## 2019-08-06 MED ORDER — FENTANYL CITRATE (PF) 250 MCG/5ML IJ SOLN
INTRAMUSCULAR | Status: AC
  Filled 2019-08-06: qty 25

## 2019-08-06 MED ORDER — ASPIRIN EC 325 MG PO TBEC: 325.0000 mg | DELAYED_RELEASE_TABLET | Freq: Every day | ORAL | Status: DC

## 2019-08-06 MED ORDER — ONDANSETRON HCL 4 MG/2ML IJ SOLN: 4.0000 mg | Freq: Four times a day (QID) | INTRAMUSCULAR | Status: DC | PRN

## 2019-08-06 MED ORDER — THROMBIN 20000 UNITS EX SOLR
CUTANEOUS | Status: DC | PRN
  Administered 2019-08-06: 20000 [IU] via TOPICAL

## 2019-08-06 MED ORDER — TRAMADOL HCL 50 MG PO TABS: 50.0000 mg | ORAL_TABLET | ORAL | Status: DC | PRN

## 2019-08-06 MED ORDER — SODIUM CHLORIDE 0.9% FLUSH: 3.0000 mL | INTRAVENOUS | Status: DC | PRN

## 2019-08-06 MED ORDER — DEXMEDETOMIDINE HCL IN NACL 400 MCG/100ML IV SOLN
0.0000 ug/kg/h | INTRAVENOUS | Status: DC
  Administered 2019-08-06: 0.7 ug/kg/h via INTRAVENOUS
  Filled 2019-08-06 (×2): qty 100

## 2019-08-06 MED ORDER — PANTOPRAZOLE SODIUM 40 MG PO TBEC: 40.0000 mg | DELAYED_RELEASE_TABLET | Freq: Every day | ORAL | Status: DC

## 2019-08-06 MED ORDER — SODIUM CHLORIDE 0.9% IV SOLUTION: Freq: Once | INTRAVENOUS | Status: DC

## 2019-08-06 MED ORDER — EPHEDRINE SULFATE-NACL 50-0.9 MG/10ML-% IV SOSY
PREFILLED_SYRINGE | INTRAVENOUS | Status: DC | PRN
  Administered 2019-08-06: 5 mg via INTRAVENOUS

## 2019-08-06 MED ORDER — LACTATED RINGERS IV SOLN: INTRAVENOUS | Status: DC | PRN

## 2019-08-06 MED ORDER — ACETAMINOPHEN 160 MG/5ML PO SOLN: 650.0000 mg | Freq: Once | ORAL | Status: AC

## 2019-08-06 MED ORDER — SODIUM CHLORIDE 0.9 % IV SOLN
1.5000 g | Freq: Two times a day (BID) | INTRAVENOUS | Status: DC
  Administered 2019-08-06: 1.5 g via INTRAVENOUS
  Filled 2019-08-06 (×2): qty 1.5

## 2019-08-06 MED ORDER — SODIUM CHLORIDE 0.9 % IV SOLN: INTRAVENOUS | Status: DC | PRN

## 2019-08-06 MED ORDER — SODIUM BICARBONATE 8.4 % IV SOLN
50.0000 meq | Freq: Once | INTRAVENOUS | Status: AC
  Administered 2019-08-06: 50 meq via INTRAVENOUS

## 2019-08-06 MED ORDER — CHLORHEXIDINE GLUCONATE 0.12% ORAL RINSE (MEDLINE KIT)
15.0000 mL | Freq: Two times a day (BID) | OROMUCOSAL | Status: DC
  Administered 2019-08-06: 15 mL via OROMUCOSAL

## 2019-08-06 MED ORDER — SODIUM CHLORIDE 0.9% FLUSH: 3.0000 mL | Freq: Two times a day (BID) | INTRAVENOUS | Status: DC

## 2019-08-06 MED ORDER — HEPARIN SODIUM (PORCINE) 1000 UNIT/ML IJ SOLN
INTRAMUSCULAR | Status: AC
  Filled 2019-08-06: qty 2

## 2019-08-06 MED ORDER — OXYCODONE HCL 5 MG PO TABS: 5.0000 mg | ORAL_TABLET | ORAL | Status: DC | PRN

## 2019-08-06 MED ORDER — SODIUM CHLORIDE 0.9 % IV SOLN
20.0000 ug | Freq: Once | INTRAVENOUS | Status: AC
  Administered 2019-08-06: 20 ug via INTRAVENOUS
  Filled 2019-08-06: qty 5

## 2019-08-06 MED ORDER — LACTATED RINGERS IV SOLN
500.0000 mL | Freq: Once | INTRAVENOUS | Status: AC | PRN
  Administered 2019-08-06: 500 mL via INTRAVENOUS

## 2019-08-06 MED ORDER — MAGNESIUM SULFATE 4 GM/100ML IV SOLN
4.0000 g | Freq: Once | INTRAVENOUS | Status: AC
  Administered 2019-08-06: 4 g via INTRAVENOUS

## 2019-08-06 MED ORDER — INSULIN REGULAR(HUMAN) IN NACL 100-0.9 UT/100ML-% IV SOLN
INTRAVENOUS | Status: DC
  Filled 2019-08-06: qty 100

## 2019-08-06 MED ORDER — LACTATED RINGERS IV SOLN: INTRAVENOUS | Status: DC

## 2019-08-06 MED ORDER — ALBUMIN HUMAN 5 % IV SOLN: INTRAVENOUS | Status: DC | PRN

## 2019-08-06 MED ORDER — FENTANYL CITRATE (PF) 250 MCG/5ML IJ SOLN
INTRAMUSCULAR | Status: DC | PRN
  Administered 2019-08-06: 50 ug via INTRAVENOUS
  Administered 2019-08-06: 100 ug via INTRAVENOUS
  Administered 2019-08-06: 300 ug via INTRAVENOUS
  Administered 2019-08-06 (×4): 100 ug via INTRAVENOUS

## 2019-08-06 MED ORDER — EPINEPHRINE HCL 5 MG/250ML IV SOLN IN NS
0.5000 ug/min | INTRAVENOUS | Status: DC
  Administered 2019-08-07: 5 ug/min via INTRAVENOUS
  Filled 2019-08-06 (×3): qty 250

## 2019-08-06 MED ORDER — MIDAZOLAM HCL 2 MG/2ML IJ SOLN
INTRAMUSCULAR | Status: AC
  Filled 2019-08-06: qty 2

## 2019-08-06 MED ORDER — NITROGLYCERIN IN D5W 200-5 MCG/ML-% IV SOLN: 0.0000 ug/min | INTRAVENOUS | Status: DC

## 2019-08-06 MED ORDER — CHLORHEXIDINE GLUCONATE CLOTH 2 % EX PADS
6.0000 | MEDICATED_PAD | Freq: Every day | CUTANEOUS | Status: DC
  Administered 2019-08-06: 6 via TOPICAL

## 2019-08-06 MED ORDER — HEMOSTATIC AGENTS (NO CHARGE) OPTIME
TOPICAL | Status: DC | PRN
  Administered 2019-08-06 (×7): 1 via TOPICAL

## 2019-08-06 MED ORDER — DEXTROSE 5 % IV SOLN
0.0000 ug/min | INTRAVENOUS | Status: DC
Start: 2019-08-06 — End: 2019-08-06

## 2019-08-06 MED ORDER — HEPARIN SODIUM (PORCINE) 1000 UNIT/ML IJ SOLN
INTRAMUSCULAR | Status: DC | PRN
  Administered 2019-08-06: 40000 [IU] via INTRAVENOUS

## 2019-08-06 MED ORDER — BISACODYL 10 MG RE SUPP: 10.0000 mg | Freq: Every day | RECTAL | Status: DC

## 2019-08-06 MED ORDER — THROMBIN 20000 UNITS EX SOLR
OROMUCOSAL | Status: DC | PRN
  Administered 2019-08-06 (×3): 4 mL via TOPICAL

## 2019-08-06 MED ORDER — THROMBIN (RECOMBINANT) 20000 UNITS EX SOLR
CUTANEOUS | Status: AC
  Filled 2019-08-06: qty 20000

## 2019-08-06 MED ORDER — PROTAMINE SULFATE 10 MG/ML IV SOLN
INTRAVENOUS | Status: DC | PRN
  Administered 2019-08-06: 20 mg via INTRAVENOUS
  Administered 2019-08-06: 380 mg via INTRAVENOUS

## 2019-08-06 MED ORDER — MILRINONE LACTATE IN DEXTROSE 20-5 MG/100ML-% IV SOLN: 0.5000 ug/kg/min | INTRAVENOUS | Status: DC

## 2019-08-06 MED ORDER — PHENYLEPHRINE HCL-NACL 20-0.9 MG/250ML-% IV SOLN
0.0000 ug/min | INTRAVENOUS | Status: DC
  Administered 2019-08-06: 30 ug/min via INTRAVENOUS
  Administered 2019-08-06: 80 ug/min via INTRAVENOUS
  Filled 2019-08-06 (×2): qty 250

## 2019-08-06 MED ORDER — 0.9 % SODIUM CHLORIDE (POUR BTL) OPTIME
TOPICAL | Status: DC | PRN
  Administered 2019-08-06: 1000 mL

## 2019-08-06 MED ORDER — 0.9 % SODIUM CHLORIDE (POUR BTL) OPTIME
TOPICAL | Status: DC | PRN
  Administered 2019-08-06: 5000 mL
  Administered 2019-08-06: 1000 mL

## 2019-08-06 MED ORDER — ROCURONIUM BROMIDE 100 MG/10ML IV SOLN
INTRAVENOUS | Status: DC | PRN
  Administered 2019-08-06 (×2): 50 mg via INTRAVENOUS

## 2019-08-06 MED ORDER — METOPROLOL TARTRATE 5 MG/5ML IV SOLN: 2.5000 mg | INTRAVENOUS | Status: DC | PRN

## 2019-08-06 MED ORDER — PROPOFOL 10 MG/ML IV BOLUS
INTRAVENOUS | Status: DC | PRN
  Administered 2019-08-06: 40 mg via INTRAVENOUS

## 2019-08-06 MED ORDER — HEPARIN SODIUM (PORCINE) 1000 UNIT/ML IJ SOLN
INTRAMUSCULAR | Status: AC
  Filled 2019-08-06: qty 1

## 2019-08-06 MED ORDER — ORAL CARE MOUTH RINSE
15.0000 mL | OROMUCOSAL | Status: DC
  Administered 2019-08-06: 15 mL via OROMUCOSAL

## 2019-08-06 MED ORDER — SODIUM CHLORIDE 0.9 % IV SOLN: 250.0000 mL | INTRAVENOUS | Status: DC

## 2019-08-06 MED ORDER — SODIUM CHLORIDE 0.45 % IV SOLN: INTRAVENOUS | Status: DC | PRN

## 2019-08-06 MED ORDER — ASPIRIN 81 MG PO CHEW: 324.0000 mg | CHEWABLE_TABLET | Freq: Every day | ORAL | Status: DC

## 2019-08-06 MED ORDER — ACETAMINOPHEN 160 MG/5ML PO SOLN: 1000.0000 mg | Freq: Four times a day (QID) | ORAL | Status: DC

## 2019-08-06 MED ORDER — ALBUMIN HUMAN 5 % IV SOLN
250.0000 mL | INTRAVENOUS | Status: DC | PRN
  Administered 2019-08-06 (×2): 12.5 g via INTRAVENOUS
  Filled 2019-08-06: qty 250

## 2019-08-06 MED ORDER — SUCCINYLCHOLINE CHLORIDE 20 MG/ML IJ SOLN
INTRAMUSCULAR | Status: DC | PRN
  Administered 2019-08-06: 200 mg via INTRAVENOUS

## 2019-08-06 MED ORDER — PROPOFOL 10 MG/ML IV BOLUS
INTRAVENOUS | Status: AC
  Filled 2019-08-06: qty 20

## 2019-08-06 MED ORDER — ROCURONIUM BROMIDE 10 MG/ML (PF) SYRINGE
PREFILLED_SYRINGE | INTRAVENOUS | Status: DC | PRN
  Administered 2019-08-06: 40 mg via INTRAVENOUS
  Administered 2019-08-06: 50 mg via INTRAVENOUS
  Administered 2019-08-06: 20 mg via INTRAVENOUS
  Administered 2019-08-06 (×2): 50 mg via INTRAVENOUS
  Administered 2019-08-06: 45 mg via INTRAVENOUS
  Administered 2019-08-06: 50 mg via INTRAVENOUS

## 2019-08-06 MED ORDER — TRANEXAMIC ACID 1000 MG/10ML IV SOLN
INTRAVENOUS | Status: DC | PRN
  Administered 2019-08-06: 1.5 mg/kg/h via INTRAVENOUS

## 2019-08-06 MED ORDER — ACETAMINOPHEN 650 MG RE SUPP
650.0000 mg | Freq: Once | RECTAL | Status: AC
  Administered 2019-08-06: 650 mg via RECTAL

## 2019-08-06 MED ORDER — POTASSIUM CHLORIDE 10 MEQ/50ML IV SOLN: 10.0000 meq | INTRAVENOUS | Status: AC

## 2019-08-06 MED ORDER — CHLORHEXIDINE GLUCONATE 0.12 % MT SOLN
15.0000 mL | OROMUCOSAL | Status: AC
  Administered 2019-08-06: 15 mL via OROMUCOSAL

## 2019-08-06 MED ORDER — DEXMEDETOMIDINE HCL IN NACL 200 MCG/50ML IV SOLN
INTRAVENOUS | Status: AC
  Filled 2019-08-06: qty 50

## 2019-08-06 MED ORDER — MORPHINE SULFATE (PF) 2 MG/ML IV SOLN
1.0000 mg | INTRAVENOUS | Status: DC | PRN
  Filled 2019-08-06: qty 1

## 2019-08-06 MED ORDER — DOCUSATE SODIUM 100 MG PO CAPS: 200.0000 mg | ORAL_CAPSULE | Freq: Every day | ORAL | Status: DC

## 2019-08-06 MED ORDER — FAMOTIDINE IN NACL 20-0.9 MG/50ML-% IV SOLN
20.0000 mg | Freq: Two times a day (BID) | INTRAVENOUS | Status: DC
  Administered 2019-08-06: 20 mg via INTRAVENOUS
  Filled 2019-08-06: qty 50

## 2019-08-06 MED ORDER — NOREPINEPHRINE 4 MG/250ML-% IV SOLN
0.0000 ug/min | INTRAVENOUS | Status: DC
  Administered 2019-08-06: 12 ug/min via INTRAVENOUS
  Filled 2019-08-06 (×2): qty 250

## 2019-08-06 MED ORDER — MIDAZOLAM HCL (PF) 10 MG/2ML IJ SOLN
INTRAMUSCULAR | Status: AC
  Filled 2019-08-06: qty 2

## 2019-08-06 MED ORDER — FENTANYL CITRATE (PF) 250 MCG/5ML IJ SOLN
INTRAMUSCULAR | Status: AC
  Filled 2019-08-06: qty 10

## 2019-08-06 MED ORDER — SODIUM CHLORIDE 0.9 % IV SOLN
INTRAVENOUS | Status: DC | PRN
  Administered 2019-08-06: .75 g via INTRAVENOUS
  Administered 2019-08-06: 1.5 g via INTRAVENOUS

## 2019-08-06 MED ORDER — TRANEXAMIC ACID 1000 MG/10ML IV SOLN
INTRAVENOUS | Status: DC | PRN
  Administered 2019-08-06 (×3): 1.5 mg/kg/h via INTRAVENOUS

## 2019-08-06 MED ORDER — MIDAZOLAM HCL 2 MG/2ML IJ SOLN
2.0000 mg | INTRAMUSCULAR | Status: DC | PRN
  Administered 2019-08-06: 2 mg via INTRAVENOUS

## 2019-08-06 MED ORDER — THROMBIN 20000 UNITS EX KIT
PACK | CUTANEOUS | Status: DC | PRN
  Administered 2019-08-06: 20000 [IU] via TOPICAL

## 2019-08-06 MED ORDER — FENTANYL CITRATE (PF) 250 MCG/5ML IJ SOLN
INTRAMUSCULAR | Status: DC | PRN
  Administered 2019-08-06: 100 ug via INTRAVENOUS
  Administered 2019-08-06 (×3): 50 ug via INTRAVENOUS

## 2019-08-06 MED ORDER — MIDAZOLAM HCL 5 MG/5ML IJ SOLN
INTRAMUSCULAR | Status: DC | PRN
  Administered 2019-08-06 (×2): 2 mg via INTRAVENOUS
  Administered 2019-08-06: 4 mg via INTRAVENOUS
  Administered 2019-08-06: 1 mg via INTRAVENOUS

## 2019-08-06 MED ORDER — MUPIROCIN 2 % EX OINT
1.0000 "application " | TOPICAL_OINTMENT | Freq: Two times a day (BID) | CUTANEOUS | Status: DC
  Administered 2019-08-06: 1 via NASAL
  Filled 2019-08-06: qty 22

## 2019-08-06 MED ORDER — BISACODYL 5 MG PO TBEC: 10.0000 mg | DELAYED_RELEASE_TABLET | Freq: Every day | ORAL | Status: DC

## 2019-08-06 MED ORDER — INSULIN REGULAR(HUMAN) IN NACL 100-0.9 UT/100ML-% IV SOLN
INTRAVENOUS | Status: DC | PRN
  Administered 2019-08-06: 1 [IU]/h via INTRAVENOUS

## 2019-08-06 MED ORDER — SODIUM CHLORIDE 0.9 % IV SOLN: INTRAVENOUS | Status: DC

## 2019-08-06 MED ORDER — PROTAMINE SULFATE 10 MG/ML IV SOLN
INTRAVENOUS | Status: AC
  Filled 2019-08-06: qty 50

## 2019-08-06 MED ORDER — PLASMA-LYTE 148 IV SOLN
INTRAVENOUS | Status: DC | PRN
  Administered 2019-08-06: 500 mL

## 2019-08-06 MED ORDER — DEXTROSE 50 % IV SOLN: 0.0000 mL | INTRAVENOUS | Status: DC | PRN

## 2019-08-06 MED ORDER — METOPROLOL TARTRATE 12.5 MG HALF TABLET: 12.5000 mg | ORAL_TABLET | Freq: Two times a day (BID) | ORAL | Status: DC

## 2019-08-06 MED ORDER — METOPROLOL TARTRATE 25 MG/10 ML ORAL SUSPENSION: 12.5000 mg | Freq: Two times a day (BID) | ORAL | Status: DC

## 2019-08-06 MED ORDER — ARTIFICIAL TEARS OPHTHALMIC OINT
TOPICAL_OINTMENT | OPHTHALMIC | Status: DC | PRN
  Administered 2019-08-06: 1 via OPHTHALMIC

## 2019-08-06 MED ORDER — PHENYLEPHRINE HCL (PRESSORS) 10 MG/ML IV SOLN
INTRAVENOUS | Status: DC | PRN
  Administered 2019-08-06: 40 ug via INTRAVENOUS
  Administered 2019-08-06: 25 ug via INTRAVENOUS
  Administered 2019-08-06 (×2): 80 ug via INTRAVENOUS

## 2019-08-06 MED ORDER — VANCOMYCIN HCL 1000 MG IV SOLR
INTRAVENOUS | Status: DC | PRN
  Administered 2019-08-06: 1500 mg via INTRAVENOUS

## 2019-08-06 MED ORDER — VANCOMYCIN HCL IN DEXTROSE 1-5 GM/200ML-% IV SOLN
1000.0000 mg | Freq: Once | INTRAVENOUS | Status: AC
  Administered 2019-08-06: 1000 mg via INTRAVENOUS
  Filled 2019-08-06: qty 200

## 2019-08-06 MED ORDER — MILRINONE LACTATE IN DEXTROSE 20-5 MG/100ML-% IV SOLN
0.5000 ug/kg/min | INTRAVENOUS | Status: DC
  Administered 2019-08-06: 0.5 ug/kg/min via INTRAVENOUS

## 2019-08-06 MED ORDER — ACETAMINOPHEN 500 MG PO TABS: 1000.0000 mg | ORAL_TABLET | Freq: Four times a day (QID) | ORAL | Status: DC

## 2019-08-06 MED ORDER — MILRINONE LACTATE IN DEXTROSE 20-5 MG/100ML-% IV SOLN
0.3000 ug/kg/min | INTRAVENOUS | Status: DC
  Administered 2019-08-06: .3 ug/kg/min via INTRAVENOUS
  Administered 2019-08-06: 0.3 ug/kg/min via INTRAVENOUS
  Filled 2019-08-06 (×2): qty 100

## 2019-08-06 SURGICAL SUPPLY — 88 items
ADAPTER CARDIO PERF ANTE/RETRO (ADAPTER) ×4 IMPLANT
BAG DECANTER FOR FLEXI CONT (MISCELLANEOUS) ×4 IMPLANT
BLADE CLIPPER SURG (BLADE) ×4 IMPLANT
BLADE STERNUM SYSTEM 6 (BLADE) ×4 IMPLANT
BLADE SURG 15 STRL LF DISP TIS (BLADE) ×2 IMPLANT
BLADE SURG 15 STRL SS (BLADE) ×2
CANISTER SUCT 3000ML PPV (MISCELLANEOUS) ×6 IMPLANT
CANNULA ARTERIAL NVNT 3/8 22FR (MISCELLANEOUS) ×2 IMPLANT
CANNULA GUNDRY RCSP 15FR (MISCELLANEOUS) ×4 IMPLANT
CATH HEART VENT LEFT (CATHETERS) ×2 IMPLANT
CATH ROBINSON RED A/P 18FR (CATHETERS) ×12 IMPLANT
CATH THORACIC 28FR (CATHETERS) ×4 IMPLANT
CATH THORACIC 36FR (CATHETERS) ×4 IMPLANT
CATH THORACIC 36FR RT ANG (CATHETERS) ×4 IMPLANT
CLOSURE WOUND 1/2 X4 (GAUZE/BANDAGES/DRESSINGS) ×1
CNTNR URN SCR LID CUP LEK RST (MISCELLANEOUS) ×2 IMPLANT
CONN 3/8X3/8 GISH STERILE (MISCELLANEOUS) ×4 IMPLANT
CONT SPEC 4OZ STRL OR WHT (MISCELLANEOUS) ×2
COVER SURGICAL LIGHT HANDLE (MISCELLANEOUS) ×2 IMPLANT
DRAPE CARDIOVASCULAR INCISE (DRAPES) ×2
DRAPE SLUSH/WARMER DISC (DRAPES) ×4 IMPLANT
DRAPE SRG 135X102X78XABS (DRAPES) ×2 IMPLANT
DRAPE SURG 17X23 STRL (DRAPES) ×2 IMPLANT
DRSG COVADERM 4X14 (GAUZE/BANDAGES/DRESSINGS) ×4 IMPLANT
ELECT BLADE 4.0 EZ CLEAN MEGAD (MISCELLANEOUS) ×4
ELECT CAUTERY BLADE 6.4 (BLADE) ×4 IMPLANT
ELECT REM PT RETURN 9FT ADLT (ELECTROSURGICAL) ×8
ELECTRODE BLDE 4.0 EZ CLN MEGD (MISCELLANEOUS) IMPLANT
ELECTRODE REM PT RTRN 9FT ADLT (ELECTROSURGICAL) ×4 IMPLANT
FELT TEFLON 1X6 (MISCELLANEOUS) ×8 IMPLANT
GAUZE SPONGE 4X4 12PLY STRL (GAUZE/BANDAGES/DRESSINGS) ×4 IMPLANT
GAUZE SPONGE 4X4 12PLY STRL LF (GAUZE/BANDAGES/DRESSINGS) ×2 IMPLANT
GLOVE BIO SURGEON STRL SZ 6 (GLOVE) ×4 IMPLANT
GLOVE BIO SURGEON STRL SZ 6.5 (GLOVE) ×2 IMPLANT
GLOVE BIO SURGEON STRL SZ7 (GLOVE) IMPLANT
GLOVE BIO SURGEON STRL SZ7.5 (GLOVE) IMPLANT
GLOVE BIO SURGEONS STRL SZ 6.5 (GLOVE) ×2
GLOVE BIOGEL PI IND STRL 6 (GLOVE) IMPLANT
GLOVE BIOGEL PI INDICATOR 6 (GLOVE) ×4
GLOVE INDICATOR 7.5 STRL GRN (GLOVE) ×6 IMPLANT
GLOVE SS BIOGEL STRL SZ 7 (GLOVE) ×4 IMPLANT
GLOVE SUPERSENSE BIOGEL SZ 7 (GLOVE)
GOWN STRL REUS W/ TWL LRG LVL3 (GOWN DISPOSABLE) ×8 IMPLANT
GOWN STRL REUS W/ TWL XL LVL3 (GOWN DISPOSABLE) ×2 IMPLANT
GOWN STRL REUS W/TWL LRG LVL3 (GOWN DISPOSABLE) ×12
GOWN STRL REUS W/TWL XL LVL3 (GOWN DISPOSABLE) ×2
HEMOSTAT POWDER SURGIFOAM 1G (HEMOSTASIS) ×18 IMPLANT
HEMOSTAT SURGICEL 2X14 (HEMOSTASIS) ×4 IMPLANT
KIT BASIN OR (CUSTOM PROCEDURE TRAY) ×4 IMPLANT
KIT CATH CPB BARTLE (MISCELLANEOUS) ×4 IMPLANT
KIT SUCTION CATH 14FR (SUCTIONS) ×4 IMPLANT
KIT TURNOVER KIT B (KITS) ×4 IMPLANT
LINE VENT (MISCELLANEOUS) ×2 IMPLANT
NS IRRIG 1000ML POUR BTL (IV SOLUTION) ×24 IMPLANT
PACK E OPEN HEART (SUTURE) ×4 IMPLANT
PACK OPEN HEART (CUSTOM PROCEDURE TRAY) ×4 IMPLANT
PAD ARMBOARD 7.5X6 YLW CONV (MISCELLANEOUS) ×8 IMPLANT
POSITIONER HEAD DONUT 9IN (MISCELLANEOUS) ×4 IMPLANT
SET CARDIOPLEGIA MPS 5001102 (MISCELLANEOUS) ×2 IMPLANT
SPONGE LAP 18X18 RF (DISPOSABLE) ×2 IMPLANT
STRIP CLOSURE SKIN 1/2X4 (GAUZE/BANDAGES/DRESSINGS) ×1 IMPLANT
SUT BONE WAX W31G (SUTURE) ×4 IMPLANT
SUT ETHIBON 2 0 V 52N 30 (SUTURE) ×12 IMPLANT
SUT ETHIBON EXCEL 2-0 V-5 (SUTURE) ×4 IMPLANT
SUT ETHIBOND 2 0 SH (SUTURE) ×2
SUT ETHIBOND 2 0 SH 36X2 (SUTURE) IMPLANT
SUT ETHIBOND V-5 VALVE (SUTURE) ×2 IMPLANT
SUT PROLENE 3 0 SH DA (SUTURE) ×2 IMPLANT
SUT PROLENE 3 0 SH1 36 (SUTURE) ×6 IMPLANT
SUT PROLENE 4 0 RB 1 (SUTURE) ×14
SUT PROLENE 4-0 RB1 .5 CRCL 36 (SUTURE) ×6 IMPLANT
SUT SILK  1 MH (SUTURE) ×2
SUT SILK 1 MH (SUTURE) IMPLANT
SUT SILK 2 0 SH CR/8 (SUTURE) ×2 IMPLANT
SUT STEEL 6MS V (SUTURE) IMPLANT
SUT STEEL SZ 6 DBL 3X14 BALL (SUTURE) ×6 IMPLANT
SUT VIC AB 1 CTX 36 (SUTURE) ×6
SUT VIC AB 1 CTX36XBRD ANBCTR (SUTURE) ×4 IMPLANT
SYSTEM SAHARA CHEST DRAIN ATS (WOUND CARE) ×6 IMPLANT
TAPE CLOTH SURG 4X10 WHT LF (GAUZE/BANDAGES/DRESSINGS) ×2 IMPLANT
TAPE PAPER 2X10 WHT MICROPORE (GAUZE/BANDAGES/DRESSINGS) ×2 IMPLANT
TOWEL GREEN STERILE (TOWEL DISPOSABLE) ×6 IMPLANT
TOWEL GREEN STERILE FF (TOWEL DISPOSABLE) ×2 IMPLANT
TRAY FOLEY SLVR 16FR TEMP STAT (SET/KITS/TRAYS/PACK) ×4 IMPLANT
UNDERPAD 30X36 HEAVY ABSORB (UNDERPADS AND DIAPERS) ×2 IMPLANT
VALVE AORTIC SZ25 INSP/RESIL (Prosthesis & Implant Heart) ×2 IMPLANT
VENT LEFT HEART 12002 (CATHETERS) ×4
WATER STERILE IRR 1000ML POUR (IV SOLUTION) ×8 IMPLANT

## 2019-08-06 SURGICAL SUPPLY — 70 items
BAG DECANTER FOR FLEXI CONT (MISCELLANEOUS) ×1 IMPLANT
BLADE CLIPPER SURG (BLADE) ×3 IMPLANT
CANISTER SUCT 3000ML PPV (MISCELLANEOUS) ×3 IMPLANT
CATH ROBINSON RED A/P 18FR (CATHETERS) IMPLANT
CLOSURE WOUND 1/2 X4 (GAUZE/BANDAGES/DRESSINGS) ×1
COVER SURGICAL LIGHT HANDLE (MISCELLANEOUS) ×2 IMPLANT
DRAPE CARDIOVASCULAR INCISE (DRAPES) ×2
DRAPE SLUSH/WARMER DISC (DRAPES) ×2 IMPLANT
DRAPE SRG 135X102X78XABS (DRAPES) ×1 IMPLANT
DRAPE SURG 17X23 STRL (DRAPES) ×2 IMPLANT
DRSG AQUACEL AG ADV 3.5X14 (GAUZE/BANDAGES/DRESSINGS) ×2 IMPLANT
DRSG COVADERM 4X14 (GAUZE/BANDAGES/DRESSINGS) ×3 IMPLANT
ELECT CAUTERY BLADE 6.4 (BLADE) ×3 IMPLANT
ELECT REM PT RETURN 9FT ADLT (ELECTROSURGICAL) ×6
ELECTRODE REM PT RTRN 9FT ADLT (ELECTROSURGICAL) ×2 IMPLANT
GAUZE SPONGE 4X4 12PLY STRL (GAUZE/BANDAGES/DRESSINGS) ×4 IMPLANT
GAUZE SPONGE 4X4 12PLY STRL LF (GAUZE/BANDAGES/DRESSINGS) ×2 IMPLANT
GLOVE BIO SURGEON STRL SZ 6 (GLOVE) ×2 IMPLANT
GLOVE BIO SURGEON STRL SZ 6.5 (GLOVE) ×1 IMPLANT
GLOVE BIO SURGEON STRL SZ7 (GLOVE) IMPLANT
GLOVE BIO SURGEON STRL SZ7.5 (GLOVE) IMPLANT
GLOVE BIO SURGEONS STRL SZ 6.5 (GLOVE) ×1
GLOVE BIOGEL PI IND STRL 7.0 (GLOVE) IMPLANT
GLOVE BIOGEL PI INDICATOR 7.0 (GLOVE)
GLOVE SS BIOGEL STRL SZ 7 (GLOVE) ×2 IMPLANT
GLOVE SUPERSENSE BIOGEL SZ 7 (GLOVE) ×4
GOWN STRL REUS W/ TWL LRG LVL3 (GOWN DISPOSABLE) ×4 IMPLANT
GOWN STRL REUS W/ TWL XL LVL3 (GOWN DISPOSABLE) ×1 IMPLANT
GOWN STRL REUS W/TWL LRG LVL3 (GOWN DISPOSABLE) ×8
GOWN STRL REUS W/TWL XL LVL3 (GOWN DISPOSABLE) ×2
HEMOSTAT POWDER SURGIFOAM 1G (HEMOSTASIS) ×9 IMPLANT
HEMOSTAT SURGICEL 2X14 (HEMOSTASIS) ×1 IMPLANT
KIT BASIN OR (CUSTOM PROCEDURE TRAY) ×3 IMPLANT
KIT CATH CPB BARTLE (MISCELLANEOUS) IMPLANT
KIT SUCTION CATH 14FR (SUCTIONS) ×3 IMPLANT
KIT TURNOVER KIT B (KITS) ×3 IMPLANT
NS IRRIG 1000ML POUR BTL (IV SOLUTION) ×15 IMPLANT
PACK CHEST (CUSTOM PROCEDURE TRAY) ×1 IMPLANT
PACK E OPEN HEART (SUTURE) ×3 IMPLANT
PACK OPEN HEART (CUSTOM PROCEDURE TRAY) ×2 IMPLANT
PAD ARMBOARD 7.5X6 YLW CONV (MISCELLANEOUS) ×6 IMPLANT
PAD ELECT DEFIB RADIOL ZOLL (MISCELLANEOUS) ×3 IMPLANT
PENCIL BUTTON HOLSTER BLD 10FT (ELECTRODE) IMPLANT
POSITIONER HEAD DONUT 9IN (MISCELLANEOUS) ×3 IMPLANT
SPONGE LAP 4X18 RFD (DISPOSABLE) IMPLANT
STRIP CLOSURE SKIN 1/2X4 (GAUZE/BANDAGES/DRESSINGS) ×1 IMPLANT
SUT MNCRL AB 4-0 PS2 18 (SUTURE) IMPLANT
SUT PROLENE 3 0 SH1 36 (SUTURE) IMPLANT
SUT PROLENE 4 0 RB 1 (SUTURE)
SUT PROLENE 4-0 RB1 .5 CRCL 36 (SUTURE) IMPLANT
SUT PROLENE 5 0 C 1 36 (SUTURE) IMPLANT
SUT PROLENE 6 0 C 1 30 (SUTURE) IMPLANT
SUT PROLENE 7 0 BV 1 (SUTURE) IMPLANT
SUT PROLENE 7 0 BV1 MDA (SUTURE) IMPLANT
SUT PROLENE 8 0 BV175 6 (SUTURE) IMPLANT
SUT STEEL STERNAL CCS#1 18IN (SUTURE) IMPLANT
SUT STEEL SZ 6 DBL 3X14 BALL (SUTURE) ×6 IMPLANT
SUT VIC AB 1 CTX 27 (SUTURE) ×8 IMPLANT
SUT VIC AB 1 CTX 36 (SUTURE) ×6
SUT VIC AB 1 CTX36XBRD ANBCTR (SUTURE) ×2 IMPLANT
SUT VIC AB 3-0 SH 27 (SUTURE)
SUT VIC AB 3-0 SH 27X BRD (SUTURE) IMPLANT
SUT VICRYL 4-0 PS2 18IN ABS (SUTURE) IMPLANT
SYSTEM SAHARA CHEST DRAIN ATS (WOUND CARE) ×4 IMPLANT
TAPE CLOTH SURG 4X10 WHT LF (GAUZE/BANDAGES/DRESSINGS) ×2 IMPLANT
TAPE PAPER 3X10 WHT MICROPORE (GAUZE/BANDAGES/DRESSINGS) ×2 IMPLANT
TOWEL GREEN STERILE (TOWEL DISPOSABLE) ×3 IMPLANT
TOWEL GREEN STERILE FF (TOWEL DISPOSABLE) ×3 IMPLANT
UNDERPAD 30X36 HEAVY ABSORB (UNDERPADS AND DIAPERS) ×3 IMPLANT
WATER STERILE IRR 1000ML POUR (IV SOLUTION) ×6 IMPLANT

## 2019-08-06 NOTE — Anesthesia Procedure Notes (Signed)
Central Venous Catheter Insertion Performed by: Nolon Nations, MD, anesthesiologist Start/End6/05/2019 6:50 AM, 08/06/2019 7:05 AM Patient location: Pre-op. Preanesthetic checklist: patient identified, IV checked, site marked, risks and benefits discussed, surgical consent, monitors and equipment checked, pre-op evaluation, timeout performed and anesthesia consent Position: Trendelenburg Lidocaine 1% used for infiltration and patient sedated Hand hygiene performed  and maximum sterile barriers used  Catheter size: 9 Fr MAC introducer PA Cath depth:50 Procedure performed using ultrasound guided technique. Ultrasound Notes:anatomy identified, needle tip was noted to be adjacent to the nerve/plexus identified, no ultrasound evidence of intravascular and/or intraneural injection and image(s) printed for medical record Attempts: 1 Following insertion, line sutured, dressing applied and Biopatch. Post procedure assessment: blood return through all ports, free fluid flow and no air  Patient tolerated the procedure well with no immediate complications.

## 2019-08-06 NOTE — Anesthesia Procedure Notes (Signed)
Central Venous Catheter Insertion Performed by: Nolon Nations, MD, anesthesiologist Start/End6/05/2019 7:05 AM, 08/06/2019 7:15 AM Patient location: Pre-op. Preanesthetic checklist: patient identified, IV checked, site marked, risks and benefits discussed, surgical consent, monitors and equipment checked, pre-op evaluation, timeout performed and anesthesia consent Position: supine Hand hygiene performed  and maximum sterile barriers used  PA cath was placed.Swan type:thermodilution PA Cath depth:50 Procedure performed without using ultrasound guided technique. Attempts: 1 Post procedure assessment: no air, free fluid flow and blood return through all ports  Patient tolerated the procedure well with no immediate complications.

## 2019-08-06 NOTE — Progress Notes (Signed)
Received from 5W07, ambulatory, to 5W31. Assisted to bed and positioned for comfort. Oriented to room, bed and unit. Emotional support given for open heart surgery in AM.

## 2019-08-06 NOTE — Brief Op Note (Signed)
07/21/2019 - 08/06/2019  11:43 PM  PATIENT:  Steve Richards  54 y.o. male  PRE-OPERATIVE DIAGNOSIS:  bleeding  POST-OPERATIVE DIAGNOSIS:  bleeding  PROCEDURE:  Procedure(s): EXPLORATION POST OPERATIVE OPEN HEART (N/A)  SURGEON:  Surgeon(s) and Role:    * Sherill Wegener, Fernande Boyden, MD - Primary  PHYSICIAN ASSISTANT: none  ASSISTANTS: Vernie Murders, RNFA   ANESTHESIA:   general  EBL:  674 mL   BLOOD ADMINISTERED: 2 units PRBC  DRAINS: same two mediastinal tubes and two pleural tubes   LOCAL MEDICATIONS USED:  NONE  SPECIMEN:  No Specimen  DISPOSITION OF SPECIMEN:  N/A  COUNTS:  YES  TOURNIQUET:  * No tourniquets in log *  DICTATION: .Note written in EPIC  PLAN OF CARE: Admit to inpatient   PATIENT DISPOSITION:  ICU - intubated and hemodynamically stable.   Delay start of Pharmacological VTE agent (>24hrs) due to surgical blood loss or risk of bleeding: yes

## 2019-08-06 NOTE — Anesthesia Preprocedure Evaluation (Addendum)
Anesthesia Evaluation  Patient identified by MRN, date of birth, ID band Patient unresponsive    Reviewed: Allergy & Precautions, Patient's Chart, lab work & pertinent test results, Unable to perform ROS - Chart review onlyPreop documentation limited or incomplete due to emergent nature of procedure.  Airway Mallampati: Intubated       Dental   Intubated:   Pulmonary  Intubated   breath sounds clear to auscultation   + intubated    Cardiovascular +CHF  + Valvular Problems/Murmurs (s/p AVR) AS  Rhythm:Regular Rate:Normal - Systolic murmurs Echo 4/40/1027 1. Left ventricular ejection fraction, by estimation, is 40 to 45%. The left ventricle has mildly decreased function. The left ventricle demonstrates global hypokinesis. There is moderate left ventricular hypertrophy. Left ventricular diastolic parameters are consistent with Grade II diastolic dysfunction (pseudonormalization).  2. Right ventricular systolic function is mildly reduced. The right ventricular size is normal. There is moderately elevated pulmonary artery systolic pressure.  3. Left atrial size was moderately dilated.  4. The mitral valve is abnormal. Trivial mitral valve regurgitation.  5. Cannot exclude a bicuspid valve. Aortic valve regurgitation is mild. Critical aortic valve stenosis. Aortic valve area, by VTI measures 0.73 cm. Aortic valve mean gradient measures 96.2 mmHg. Peak gradient of 137  mmHg. Aortic valve Vmax measures 5.86 m/s.  6. The inferior vena cava is dilated in size with <50% respiratory variability, suggesting right atrial pressure of 15 mmHg.  7. Moderate pleural effusion in the right lateral region.   Neuro/Psych negative neurological ROS  negative psych ROS   GI/Hepatic negative GI ROS, (+) Cirrhosis       ,   Endo/Other  negative endocrine ROS  Renal/GU negative Renal ROS     Musculoskeletal negative musculoskeletal ROS (+)    Abdominal (+) + obese,   Peds  Hematology  (+) anemia ,   Anesthesia Other Findings bleeding  Reproductive/Obstetrics                            Anesthesia Physical  Anesthesia Plan  ASA: IV and emergent  Anesthesia Plan: General   Post-op Pain Management:    Induction: Intravenous  PONV Risk Score and Plan: 3 and Ondansetron, Treatment may vary due to age or medical condition and Midazolam  Airway Management Planned: Oral ETT  Additional Equipment: Arterial line, CVP and PA Cath  Intra-op Plan:   Post-operative Plan: Post-operative intubation/ventilation  Informed Consent:   Plan Discussed with: CRNA  Anesthesia Plan Comments:       Anesthesia Quick Evaluation

## 2019-08-06 NOTE — Brief Op Note (Signed)
07/21/2019 - 08/06/2019  11:32 AM  PATIENT:  Steve Richards  54 y.o. male  PRE-OPERATIVE DIAGNOSIS:  1. SEVERE AORTIC STENOSIS 2. MODERATE to SEVERE AI  POST-OPERATIVE DIAGNOSIS:  1. SEVERE AORTIC STENOSIS 2. MODERATE to SEVERE AI  PROCEDURE:  TRANSESOPHAGEAL ECHOCARDIOGRAM (TEE), MEDIAN STERNOTOMY for  AORTIC VALVE REPLACEMENT (AVR) (using Pericardial Tissue Valve-Edwards INSPIRIS, Model# 11500A, Serial# [id number], Size #Resilia 25 MM)  SURGEON:  Surgeon(s) and Role:    Bartle, Fernande Boyden, MD - Primary  PHYSICIAN ASSISTANT: Lars Pinks PA-C  ASSISTANTS:  Vernie Murders RNFA  ANESTHESIA:   general  EBL:  Per anesthesia, perfusion record  DRAINS: Chest tubes placed in the mediastinal and pleural spaces   SPECIMEN:  Source of Specimen:  Native AV leaflets  DISPOSITION OF SPECIMEN:  PATHOLOGY  COUNTS CORRECT:  YES   DICTATION: .Dragon Dictation  PLAN OF CARE: Admit to inpatient   PATIENT DISPOSITION:  ICU - intubated and hemodynamically stable.   Delay start of Pharmacological VTE agent (>24hrs) due to surgical blood loss or risk of bleeding: yes  BASELINE WEIGHT: 117 kg

## 2019-08-06 NOTE — Anesthesia Procedure Notes (Addendum)
Procedure Name: Intubation Date/Time: 08/06/2019 7:56 AM Performed by: Bryson Corona, CRNA Pre-anesthesia Checklist: Patient identified, Emergency Drugs available, Suction available and Patient being monitored Patient Re-evaluated:Patient Re-evaluated prior to induction Oxygen Delivery Method: Circle System Utilized Preoxygenation: Pre-oxygenation with 100% oxygen Induction Type: IV induction Ventilation: Oral airway inserted - appropriate to patient size and Two handed mask ventilation required Laryngoscope Size: Mac and 4 Grade View: Grade I Tube type: Oral Tube size: 8.0 mm Number of attempts: 1 Airway Equipment and Method: Stylet and Oral airway Placement Confirmation: ETT inserted through vocal cords under direct vision,  positive ETCO2 and breath sounds checked- equal and bilateral Secured at: 23 cm Tube secured with: Tape Dental Injury: Teeth and Oropharynx as per pre-operative assessment

## 2019-08-06 NOTE — Progress Notes (Signed)
Patient ID: Steve Richards, male   DOB: 1965/07/11, 54 y.o.   MRN: [id number] TCTS:  He had normal chest tube output immediately postop but it increased a few hours later. Given his liver dysfunction from right heart failure I gave him 2 units of FFP and 2 units of Cryo to correct any coagulopathy. Despite this he has had persistent chest tube output and put out 450 cc last her from the mediastinal tubes. I am going to take him back for exploration of mediastinum for bleeding. He has remained hemodynamically stable.

## 2019-08-06 NOTE — Progress Notes (Signed)
Patient ID: Steve Richards, male   DOB: 04/26/1965, 54 y.o.   MRN: [id number] EVENING ROUNDS NOTE :     [address]       [place] [address]             [phone]                 Day of Surgery Procedure(s) (LRB): AORTIC VALVE REPLACEMENT (AVR) using Edwards INSPIRIS Resilia 25 MM Aortic Valve. (N/A) TRANSESOPHAGEAL ECHOCARDIOGRAM (TEE) (N/A)  Total Length of Stay:  LOS: 16 days  BP (!) 96/49   Pulse 89   Temp 97.9 F (36.6 C)   Resp 20   Ht 6' (1.829 m)   Wt 116.5 kg   SpO2 100%   BMI 34.83 kg/m   .Intake/Output      06/03 0701 - 06/04 0700   P.O.    I.V. (mL/kg) 4009.5 (34.4)   Blood 1983   IV Piggyback 640.8   Total Intake(mL/kg) 6633.3 (56.9)   Urine (mL/kg/hr) 1175 (0.8)   Emesis/NG output 0   Stool    Blood 2245   Chest Tube 620   Total Output 4040   Net +2593.3         . sodium chloride Stopped (08/06/19 1442)  . [START ON 08/07/2019] sodium chloride    . sodium chloride    . albumin human 12.5 g (08/06/19 1636)  . cefUROXime (ZINACEF)  IV    . dexmedetomidine (PRECEDEX) IV infusion Stopped (08/06/19 1629)  . epinephrine 4 mcg/min (08/06/19 1800)  . famotidine (PEPCID) IV 20 mg (08/06/19 1443)  . insulin 1.9 mL/hr at 08/06/19 1800  . lactated ringers Stopped (08/06/19 1451)  . lactated ringers 20 mL/hr at 08/06/19 1800  . magnesium sulfate 20 mL/hr at 08/06/19 1800  . milrinone 0.3 mcg/kg/min (08/06/19 1800)  . nitroGLYCERIN    . norepinephrine (LEVOPHED) Adult infusion 12 mcg/min (08/06/19 1840)  . phenylephrine (NEO-SYNEPHRINE) Adult infusion 80 mcg/min (08/06/19 1800)  . vancomycin       Lab Results  Component Value Date   WBC 20.6 (H) 08/06/2019   HGB 11.2 (L) 08/06/2019   HCT 34.8 (L) 08/06/2019   PLT 179 08/06/2019   GLUCOSE 139 (H) 08/06/2019   CHOL 128 07/21/2019   TRIG 87 07/21/2019   HDL 37 (L) 07/21/2019   LDLCALC 74 07/21/2019   ALT 20 08/06/2019   AST 28 08/06/2019   NA 139 08/06/2019   K 4.0 08/06/2019    CL 100 08/06/2019   CREATININE 0.70 08/06/2019   BUN 19 08/06/2019   CO2 27 08/06/2019   TSH 14.146 (H) 07/21/2019   INR 1.6 (H) 08/06/2019   HGBA1C 6.1 (H) 08/06/2019   respiratory acidosis MT 710 over 5 hours PL 310 over 5 hours Getting blood products now, 2 cryo and 2 ffp, ddavp paced   Grace Isaac MD  Beeper [phone] Office [phone] 08/06/2019 7:08 PM

## 2019-08-06 NOTE — Progress Notes (Signed)
Paged Bartle MD regarding pt's output of 300 ml from his mediastinal chest tube in the last 40 mins and 100 ml from pleural chest tube. MD states he will call the OR.

## 2019-08-06 NOTE — Transfer of Care (Signed)
Immediate Anesthesia Transfer of Care Note  Patient: Steve Richards  Procedure(s) Performed: AORTIC VALVE REPLACEMENT (AVR) using Edwards INSPIRIS Resilia 25 MM Aortic Valve. (N/A Chest) TRANSESOPHAGEAL ECHOCARDIOGRAM (TEE) (N/A )  Patient Location: ICU  Anesthesia Type:General  Level of Consciousness: Patient remains intubated per anesthesia plan  Airway & Oxygen Therapy: Patient remains intubated per anesthesia plan and Patient placed on Ventilator (see vital sign flow sheet for setting)  Post-op Assessment: Report given to RN and Post -op Vital signs reviewed and unstable, Anesthesiologist notified  Post vital signs: Reviewed and stable  Last Vitals:  Vitals Value Taken Time  BP 99/43   Temp    Pulse 90   Resp 16   SpO2 100     Last Pain:  Vitals:   08/06/19 0416  TempSrc: Oral  PainSc:          Complications: No apparent anesthesia complications

## 2019-08-06 NOTE — Progress Notes (Signed)
  Echocardiogram Echocardiogram Transesophageal has been performed.  Steve Richards 08/06/2019, 9:59 AM

## 2019-08-06 NOTE — Op Note (Signed)
CARDIOVASCULAR SURGERY OPERATIVE NOTE  08/06/2019 Steve Richards [id number]  Surgeon:  Gaye Pollack, MD  First Assistant: Lars Pinks,  PA-C   Preoperative Diagnosis:  Critical aortic stenosis   Postoperative Diagnosis:  Same   Procedure:  1. Median Sternotomy 2. Extracorporeal circulation 3.   Aortic valve replacement using a 25 mm Edwards INSPIRIS RESILIA pericardial valve.  Anesthesia:  General Endotracheal   Clinical History/Surgical Indication:  This 54 year old gentleman has stage D, critical, symptomatic aortic stenosis admitted with New York Heart Association class IV symptoms of shortness of breath at rest, orthopnea, massive lower extremity edema, and BNP of 1143 consistent with acute on chronic combined systolic and diastolic congestive heart failure.  He has improved significantly with massive diuresis.  I have personally reviewed his 2D echocardiogram, cardiac catheterization, and CTA studies.  His echocardiogram shows a heavily calcified immobile aortic valve with a mean gradient of 96 mmHg consistent with critical aortic stenosis.  Left ventricular and right ventricular systolic function are mildly decreased with grade 2 diastolic dysfunction.  His cardiac catheterization shows no significant coronary disease.  There is severe pulmonary hypertension at 68/34 which is 2/3 systemic and elevated right and left heart pressures.  His gated cardiac CTA shows a trileaflet aortic valve that is heavily calcified, thickened, and immobile.  I agree that aortic valve replacement is indicated in this patient.  Given his young age and relatively good medical condition overall I think that open surgical aortic valve replacement would be the best option for him.  I think transcatheter aortic valve replacement would be associated with a significant risk of perivalvular leak as well as reduced long-term durability.  I discussed the alternatives of bioprosthetic and mechanical valves with  the patient.  He said that he does not want to be on Coumadin and would rather have a bioprosthetic valve.  I think that is a reasonable option his age given our current generation of bioprosthetic valves with improved longevity.I discussed the operative procedure with the patient including alternatives, benefits and risks; including but not limited to bleeding, blood transfusion, infection, stroke, myocardial infarction, graft failure, heart block requiring a permanent pacemaker, organ dysfunction, and death.  Steve Richards understands and agrees to proceed.   Preparation:  The patient was seen in the preoperative holding area and the correct patient, correct operation were confirmed with the patient after reviewing the medical record and catheterization. The consent was signed by me. Preoperative antibiotics were given. A pulmonary arterial line and radial arterial line were placed by the anesthesia team. The patient was taken back to the operating room and positioned supine on the operating room table. After being placed under general endotracheal anesthesia by the anesthesia team a foley catheter was placed. The neck, chest, abdomen, and both legs were prepped with betadine soap and solution and draped in the usual sterile manner. A surgical time-out was taken and the correct patient and operative procedure were confirmed with the nursing and anesthesia staff.   Pre-bypass TEE:   Complete TEE assessment was performed by Dr. Lissa Hoard. There is severe aortic valve calcification and complete immobility of the leaflets with severe stenosis and severe AI. There is moderate LVH and global hypokinesis with EF 40%. RV mildly dilated and mildly hypokinetic. Minimal TR and minimal MR    Post-bypass TEE:   Normal functioning prosthetic aortic valve with no perivalvular leak or regurgitation through the valve. Left ventricular function improved. Unchanged trivial mitral regurgitation. RV systolic function  improved with reduction in  PA pressures.     Cardiopulmonary Bypass:  A median sternotomy was performed. The pericardium was opened in the midline. Right ventricular function appeared normal. The ascending aorta was of normal size and had no palpable plaque. There were no contraindications to aortic cannulation or cross-clamping. The patient was fully systemically heparinized and the ACT was maintained > 400 sec. The proximal aortic arch was cannulated with a 61 F aortic cannula for arterial inflow. Venous cannulation was performed via the right atrial appendage using a two-staged venous cannula. An antegrade cardioplegia/vent cannula was inserted into the mid-ascending aorta. A left ventricular vent was placed via the right superior pulmonary vein. A retrograde cardioplegia cannnula was placed into the coronary sinus via the right atrium. Aortic occlusion was performed with a single cross-clamp. Systemic cooling to 32 degrees Centigrade and topical cooling of the heart with iced saline were used. Hyperkalemic antegrade cold blood cardioplegia was used to induce diastolic arrest and then cold blood retrograde cardioplegia was given at about 20 minute intervals throughout the period of arrest to maintain myocardial temperature at or below 10 degrees centigrade. A temperature probe was inserted into the interventricular septum and an insulating pad was placed in the pericardium. Carbon dioxide was insufflated into the pericardium at 5L/min throughout the procedure to minimize intracardiac air.   Aortic Valve Replacement:   A transverse aortotomy was performed 1 cm above the take-off of the right coronary artery. The native valve was tricuspid with severely calcified leaflets that were completely fixed and severe annular calcification. There was a small fixed central opening allowing the AI.The ostia of the coronary arteries were in normal position and were not obstructed. The native valve leaflets were  excised and the annulus was decalcified with rongeurs. Care was taken to remove all particulate debris. The left ventricle was directly inspected for debris and then irrigated with ice saline solution. The annulus was sized and a size 25 mm Edwards INSPIRIS RESILIA valve was chosen. The model number was [id number] and the serial number was [id number]. While the valve was being prepared 2-0 Ethibond pledgeted horizontal mattress sutures were placed around the annulus with the pledgets in a sub-annular position. The sutures were placed through the sewing ring and the valve lowered into place. The sutures were tied sequentially. The valve seated nicely and the coronary ostia were not obstructed. The prosthetic valve leaflets moved normally and there was no sub-valvular obstruction. The aortotomy was closed using 4-0 Prolene suture in 2 layers with felt strips to reinforce the closure.  Completion:  The patient was rewarmed to 37 degrees Centigrade. De-airing maneuvers were performed and the head placed in trendelenburg position. The crossclamp was removed with a time of 126 minutes. There was spontaneous return of sinus rhythm. The aortotomy was checked for hemostasis. Two temporary epicardial pacing wires were placed on the right atrium and two on the right ventricle. The left ventricular vent and retrograde cardioplegia cannulas were removed. The patient was weaned from CPB without difficulty on milrinone 0.5, epi 4, levophed as needed. CPB time was 168 minutes. Cardiac output was 7-8 LPM. Heparin was fully reversed with protamine and the aortic and venous cannulas removed. Hemostasis was achieved. Mediastinal drainage tubes were placed. The sternum was closed with double #6 stainless steel wires. The fascia was closed with continuous # 1 vicryl suture. The subcutaneous tissue was closed with 2-0 vicryl continuous suture. The skin was closed with 3-0 vicryl subcuticular suture. All sponge, needle, and instrument counts  were reported  correct at the end of the case. Dry sterile dressings were placed over the incisions and around the chest tubes which were connected to pleurevac suction. The patient was then transported to the surgical intensive care unit in critical but stable condition.

## 2019-08-06 NOTE — Progress Notes (Signed)
Off unit via stretcher to OR accompanied by staff RN and transporter.

## 2019-08-06 NOTE — Discharge Summary (Signed)
Physician Discharge Summary       [address]       [place] [address]             [phone]    Patient ID: Steve Richards MRN: [id number] DOB/AGE: 1965-07-28 54 y.o.  Admit date: 07/21/2019 Discharge date: 08/17/2019  Admission Diagnoses: 1. New onset of congestive heart failure ([place]) 2. Severe aortic stenosis  Discharge Diagnoses:  1. S/p AVR 2. History of CHF 3. History of Obesity, Class III, BMI 40-49.9 (morbid obesity) ([place]) 4. History of Hepatic cirrhosis ([place]) 5. History of Cellulitis and abscess of left lower extremity 6. History of pleural effusion on right     Consults: cardiology  Procedure (s):  1. Median Sternotomy 2. Extracorporeal circulation 3.   Aortic valve replacement using a 25 mm Edwards INSPIRIS RESILIA pericardial valve by Dr. Cyndia Bent on 08/06/2019.  1.  Median Sternotomy 2.  Evacuation of mediastinal blood and clots 3.  Ligation of sternal bleeding site by Dr. Cyndia Bent the evening of 08/06/2019.  History of Presenting Illness: The patient is a 54 year old gentleman who has been fairly healthy in the past but reports onset of slowly progressive lower extremity edema since around November 2020.  He reports that in April 2021 he felt like he pulled his groin and was concerned about a hernia and went to urgent care.  He was referred to general surgery and underwent a CT scan that showed a moderate pleural effusion and some ascites.  He had a thoracentesis done on 06/25/2019 removing 1.5 L of yellow serous fluid.  This was negative for malignancy.  He was placed on diuretics.  He was also having problems with a left lower extremity wound in addition to the swelling that developed after hitting his leg on a concrete block in April.  He was seen at an [facility] walk-in [facility] and started on antibiotics but his leg did not seem to be improving.  He continued to have progressive swelling in his legs and abdomen and developed shortness of breath prompted  admission on 07/21/2019 for congestive heart failure.  He was noted to be markedly volume overloaded and was started on intravenous diuretics as well as antibiotics for the lower extremity cellulitis.  He had a Covid test that was positive although he had no specific symptoms.  A 2D echocardiogram was done on 07/21/2019 showing ejection fraction of 40 to 45% with global hypokinesis.  There is moderate LVH and grade 2 diastolic dysfunction.  Right ventricular systolic function was mildly reduced with moderately elevated pulmonary artery systolic pressure.  The aortic valve was heavily calcified and immobile with a mean gradient of 96 mmHg and a peak of 137 mmHg.  Peak velocity was 5.86 m/s.  There was trivial MR.  He was diuresed over 20 L.  Cardiac catheterization on 07/28/2019 showed no significant coronary disease.  The mean gradient across the aortic valve was measured at 57 mmHg with a peak gradient of 108 mmHg and a calculated valve area of 0.6 cm.  Right and left heart pressures were elevated with a PA pressure of 68/34, mean wedge pressure of 28, and a mean right atrial pressure of 21.  LVEDP was 30.  Cardiac index was 2.34.  He had a gated cardiac CTA that showed a trileaflet aortic valve with severe calcification and thickening.  Measurements were felt to be sufficient for a 29 mm transcatheter Sapien 3 valve.  The patient works as a Water engineer.  He  was seeing his dentist regularly until the beginning of Covid and has not seen his dentist since then.  He has had no significant prior dental problems. He has a non-smoker and denies alcohol abuse.  Dr. Cyndia Bent discussed the need for aortic valve replacement. Potential risks, benefits, and complications of the surgery were discussed with the patient and he agreed to proceed with surgery. Pre operative carotid duplex US showed no significant internal carotid artery stenosis bilaterally. Patient underwent an AVR on 08/06/2019.  Brief Hospital  Course:   On 08/06/2019 [name] underwent a aortic valve replacement for critical aortic stenosis with Dr. Cyndia Bent.  He tolerated procedure well and was transferred to surgical ICU.  The following morning he was brought back to the OR for a redo median sternotomy, evacuation of mediastinal blood and clots, and ligation of a sternal bleeding site with Dr. Cyndia Bent.  Postop day 1 he remained on the ventilator.  He was maintaining normal sinus rhythm on pressor support.  We kept his chest tubes in place due to output.  He was volume overloaded but we held off on diuresis until his pressors were weaned.  Postop day 2 we initiated a diuretic regimen for fluid overload.  He remained on milrinone for inotropic support.  Discontinued his chest tubes and lines.  He had a PICC line placed for IV access.  Postop day 4 he continued to progress.  Physical therapy recommended a inpatient rehab consult.  He was stable to transfer to 4 E. for continued care.  Postop day 5 we added metolazone to aid in diuresis.  His weight continued to trend down.  He continued to make slow strides with his ambulation and CIR was still recommended.  Cardiology continue to follow along. We continue diuresis for the patient's volume overload. He was denied from CIR and SNF did not seem to be an option. His EPW were removed on 08/12/2019 . Today, he is tolerating room air, his incisions are healing well, he was ambulating with limited assistance, and he is ready for discharge home with home health.     Latest Vital Signs: Blood pressure 118/72, pulse 75, temperature 97.8 F (36.6 C), temperature source Oral, resp. rate 18, height 6' (1.829 m), weight 100.4 kg, SpO2 97 %.  Physical Exam: General appearance: alert, cooperative and no distress Heart: regular rate and rhythm, S1, S2 normal, no murmur, click, rub or gallop Lungs: clear to auscultation bilaterally Abdomen: soft, non-tender; bowel sounds normal; no masses,  no  organomegaly Extremities: 2-3 + pitting edema in bilateral feet with improving edema in bilateral lower legs. Wound: sternotomy incision is healing well   Discharge Condition: Stable and discharged to home with home health.  Recent laboratory studies:  Lab Results  Component Value Date   WBC 7.7 08/12/2019   HGB 8.9 (L) 08/12/2019   HCT 28.4 (L) 08/12/2019   MCV 95.6 08/12/2019   PLT 176 08/12/2019   Lab Results  Component Value Date   NA 134 (L) 08/15/2019   K 3.8 08/15/2019   CL 92 (L) 08/15/2019   CO2 32 08/15/2019   CREATININE 0.80 08/15/2019   GLUCOSE 105 (H) 08/15/2019      Diagnostic Studies: DG Chest 2 View  Result Date: 07/21/2019 CLINICAL DATA:  Chest pain and shortness of breath EXAM: CHEST - 2 VIEW COMPARISON:  06/25/2018 FINDINGS: Congested appearance of vessels with small pleural effusions. Cardiomegaly that is chronic. Negative aortic contours. IMPRESSION: Cardiomegaly with vascular congestion and small pleural effusions. Electronically  Signed   By: Monte Fantasia M.D.   On: 07/21/2019 08:42   CARDIAC CATHETERIZATION  Result Date: 07/28/2019  There is severe aortic valve stenosis.  1.  Patent coronary arteries with no obstructive CAD 2.  Severe calcification and restriction of the aortic valve leaflets with hemodynamic evidence of critical aortic stenosis.  Mean aortic gradient 57 mmHg, peak gradient 108 mmHg, calculated aortic valve area 0.6 cm 3.  Elevated right and left heart pressures consistent with congestive heart failure Recommendations: Continued heart team evaluation for treatment options of critical aortic stenosis.  Continue IV diuresis as tolerated.  We will ask for inpatient cardiac surgical evaluation to help determine treatment plan.   CT CORONARY MORPH W/CTA COR W/SCORE W/CA W/CM &/OR WO/CM  Addendum Date: 07/27/2019   ADDENDUM REPORT: 07/27/2019 12:22 ADDENDUM: Extracardiac findings are described separately under dictation for contemporaneously  obtained CTA chest, abdomen and pelvis performed on 07/25/2019. Please see dictation for that examination for description of relevant extracardiac findings. Electronically Signed   By: Vinnie Langton M.D.   On: 07/27/2019 12:22   Result Date: 07/27/2019 CLINICAL DATA:  54 year old male with severe aortic stenosis being evaluated for a TAVR procedure. EXAM: Cardiac TAVR CT TECHNIQUE: The patient was scanned on a [employer]. A 120 kV retrospective scan was triggered in the descending thoracic aorta at 111 HU's. Gantry rotation speed was 250 msecs and collimation was .6 mm. No beta blockade or nitro were given. The 3D data set was reconstructed in 5% intervals of the R-R cycle. Systolic and diastolic phases were analyzed on a dedicated work station using MPR, MIP and VRT modes. The patient received 80 cc of contrast. FINDINGS: Aortic Valve: Trileaflet aortic valve with severely calcified and thickened leaflets and only mild calcifications extending into the LVOT. Aorta: Normal size, minimal plaque, no calcifications, no dissection. Sinotubular Junction: 30 x 29 mm Ascending Thoracic Aorta: 35 x 34 mm Aortic Arch: Not visualized. Descending Thoracic Aorta: 22 x 22 mm Sinus of Valsalva Measurements: Non-coronary: 34 mm Right -coronary: 31 mm Left -coronary: 35 mm Coronary Artery Height above Annulus: Left Main: 13.1 mm Right Coronary: 18.2 mm Virtual Basal Annulus Measurements: Maximum/Minimum Diameter: 34.0 x 25.7 mm Mean Diameter: 28.2 mm Perimeter: 94.1 mm Area: 626 mm2 Coronary Arteries: Calcium score is 0. Coronary Arteries:  Normal coronary origin.  Right dominance. RCA is a large dominant artery that gives rise to PDA and PLA. There is no plaque. Left main is a large artery that gives rise to LAD and LCX arteries. Left main has no plaque. LAD is a large vessel that has no plaque. LCX is a non-dominant artery that gives rise to one large OM1 branch. There is no plaque. Optimum Fluoroscopic Angle for  Delivery: RAO 0 CRA 0 IMPRESSION: 1. Trileaflet aortic valve with severely calcified and thickened leaflets and only mild calcifications extending into the LVOT. Aortic valve calcium score 7968 consistent with severe aortic stenosis. Annular measurements suitable for delivery of a 29 mm Edwards-SAPIEN 3 valve. 2. Sufficient coronary to annulus distance. 3. Optimum Fluoroscopic Angle for Delivery:  RAO 0 CRA 0. 4. No thrombus in the left atrial appendage. 5. Coronary Arteries: Normal origin. Right dominance. Calcium score is 0. There are only minimal irregularities. 6. Dilated pulmonary artery measuring 35 suggestive of pulmonary hypertension. Electronically Signed: By: Ena Dawley On: 07/26/2019 13:01   DG Chest Port 1 View  Result Date: 08/10/2019 CLINICAL DATA:  Post CABG.  Chest tube present. EXAM: PORTABLE  CHEST 1 VIEW COMPARISON:  08/09/2019; 08/08/2019 FINDINGS: Grossly unchanged enlarged cardiac silhouette and mediastinal contours post median sternotomy and valve replacement. Interval removal of right jugular approach vascular sheath and placement of right upper extremity approach PICC line. Stable position of bilateral chest tubes. No pneumothorax. Interval development of small left and trace right-sided pleural effusions. Mild pulmonary edema with slight worsening of bibasilar opacities, left greater than right. Old right-sided rib fractures. No acute osseous abnormalities. IMPRESSION: 1. Interval removal of right jugular approach vascular sheath and placement of right upper extremity approach PICC line with tip projected over the superior cavoatrial junction. 2. Stable positioning of bilateral chest tubes.  No pneumothorax. 3. Suspected slight worsening of pulmonary edema with small left and trace right-sided effusions and associated bibasilar opacities, likely atelectasis. Electronically Signed   By: Sandi Mariscal M.D.   On: 08/10/2019 08:10   DG Chest Port 1 View  Result Date:  08/09/2019 CLINICAL DATA:  CHF following cardiac surgery.  Postop. EXAM: PORTABLE CHEST 1 VIEW COMPARISON:  08/08/2019 FINDINGS: RIGHT IJ sheath remains in place following removal of Swan-Ganz catheter. Bilateral chest tubes remain in place. Stable cardiomegaly. Minimal subsegmental atelectasis at both lung bases, stable in appearance. No pneumothorax. IMPRESSION: 1. Stable cardiomegaly. 2. Stable bibasilar atelectasis. Electronically Signed   By: Nolon Nations M.D.   On: 08/09/2019 09:13   DG CHEST PORT 1 VIEW  Result Date: 08/08/2019 CLINICAL DATA:  Pleural effusion follow-up EXAM: PORTABLE CHEST 1 VIEW COMPARISON:  Yesterday FINDINGS: Bilateral chest tube in place. The trachea and esophagus have been extubated. Swan-Ganz catheter with tip at the right main pulmonary artery. Minimal atelectatic change. Aeration is improved. Cardiomegaly. Aortic valve replacement. No visible pneumothorax IMPRESSION: Improved aeration after extubation. Electronically Signed   By: Monte Fantasia M.D.   On: 08/08/2019 08:41   DG Chest Port 1 View  Result Date: 08/07/2019 CLINICAL DATA:  Status post coronary bypass grafting and AVR EXAM: PORTABLE CHEST 1 VIEW COMPARISON:  08/06/2018 FINDINGS: Stable postoperative changes are noted. Endotracheal tube, Swan-Ganz catheter, bilateral thoracostomy tubes and mediastinal drain are seen. Pericardial drain is noted as well. Gastric catheter shows the tip in the stomach although the proximal side port is in the distal esophagus. This should be advanced several cm. The lungs are well aerated without evidence of pneumothorax or focal infiltrate. Very mild central vascular congestion is noted. IMPRESSION: Mild central vascular congestion. Tubes and lines as described above. The gastric catheter could be advanced several cm deeper into the stomach. Electronically Signed   By: Inez Catalina M.D.   On: 08/07/2019 00:31   DG Chest Port 1 View  Result Date: 08/06/2019 CLINICAL DATA:   Shortness of breath. EXAM: PORTABLE CHEST 1 VIEW COMPARISON:  August 06, 2019. FINDINGS: Stable cardiomegaly. Endotracheal tube is in grossly good position. Distal tip of nasogastric tube is seen in proximal stomach. Bilateral chest tubes are noted without pneumothorax. Right internal jugular Swan-Ganz catheter is noted with tip directed into right pulmonary artery. No pleural effusion is noted. Bony thorax is unremarkable. IMPRESSION: Stable support apparatus. No pneumothorax is noted. Bilateral chest tubes are noted without pneumothorax. No acute cardiopulmonary abnormality seen. Electronically Signed   By: Marijo Conception M.D.   On: 08/06/2019 14:45   DG Chest Port 1 View  Result Date: 08/06/2019 CLINICAL DATA:  Heart failure EXAM: PORTABLE CHEST 1 VIEW COMPARISON:  07/21/2019 FINDINGS: Cardiac shadow is enlarged. Lungs are well aerated bilaterally. Small right-sided pleural effusion is noted. Mild bibasilar  atelectasis is seen. No bony abnormality is noted. IMPRESSION: Right-sided effusion stable from the prior exam. Underlying atelectatic changes are present. Stable cardiomegaly. Electronically Signed   By: Inez Catalina M.D.   On: 08/06/2019 03:52   CT ANGIO CHEST AORTA W/CM & OR WO/CM  Result Date: 07/27/2019 CLINICAL DATA:  54 year old male with history of severe aortic stenosis. Preprocedural study prior to potential transcatheter aortic valve replacement (TAVR) procedure. EXAM: CT ANGIOGRAPHY CHEST, ABDOMEN AND PELVIS TECHNIQUE: Non-contrast CT of the chest was initially obtained. Multidetector CT imaging through the chest, abdomen and pelvis was performed using the standard protocol during bolus administration of intravenous contrast. Multiplanar reconstructed images and MIPs were obtained and reviewed to evaluate the vascular anatomy. CONTRAST:  130m OMNIPAQUE IOHEXOL 350 MG/ML SOLN COMPARISON:  No priors. FINDINGS: CTA CHEST FINDINGS Cardiovascular: Heart size is mildly enlarged. There is no  significant pericardial fluid, thickening or pericardial calcification. Aortic atherosclerosis. No definite coronary artery calcifications. Severe thickening calcification of the aortic valve. Dilatation of the pulmonic trunk (3.6 cm in diameter). Mediastinum/Lymph Nodes: No pathologically enlarged mediastinal or hilar lymph nodes. Esophagus is unremarkable in appearance. No axillary lymphadenopathy. Lungs/Pleura: Patchy but diffuse ground-glass attenuation and mild interlobular septal thickening noted throughout the lungs bilaterally, suggesting a background of mild interstitial pulmonary edema. Moderate bilateral pleural effusions lying dependently with some associated passive subsegmental atelectasis in the lower lobes of the lungs bilaterally. No definite suspicious appearing pulmonary nodules or masses. Musculoskeletal/Soft Tissues: Old healed fracture of the mid sternum with mild posttraumatic deformity. There are no aggressive appearing lytic or blastic lesions noted in the visualized portions of the skeleton. CTA ABDOMEN AND PELVIS FINDINGS Hepatobiliary: Liver has a slightly shrunken appearance and nodular contour, indicative of underlying cirrhosis. No discrete cystic or solid hepatic lesions. No intra or extrahepatic biliary ductal dilatation. Gallbladder is nearly completely decompressed, and otherwise unremarkable in appearance. Pancreas: No pancreatic mass. No pancreatic ductal dilatation. No pancreatic or peripancreatic fluid collections or inflammatory changes. Spleen: Unremarkable. Adrenals/Urinary Tract: Left kidney and bilateral adrenal glands are normal in appearance. In the lower pole of the right kidney there is an exophytic low-attenuation nonenhancing lesion measuring 8 cm in diameter, compatible with a simple cyst. No hydroureteronephrosis. Amorphous mass-like area of enhancement along the posterior wall of the urinary bladder measuring approximately 6.4 x 2.0 x 2.5 cm (axial image 214 of  series 6 and sagittal image 111 of series 9). Stomach/Bowel: Appearance of the stomach is normal. No pathologic dilatation of small bowel or colon. A few scattered colonic diverticulae are noted, without definite surrounding inflammatory changes to suggest an acute diverticulitis (study is limited by presence of small volume of ascites). Normal appendix. Vascular/Lymphatic: Aortic atherosclerosis with vascular findings and measurements pertinent to potential TAVR procedure, as detailed below. No aneurysm or dissection noted in the abdominal or pelvic vasculature. No lymphadenopathy noted in the abdomen or pelvis. Reproductive: Prostate gland and seminal vesicles are unremarkable in appearance. Other: Small volume of ascites.  No pneumoperitoneum. Musculoskeletal: There are no aggressive appearing lytic or blastic lesions noted in the visualized portions of the skeleton. Mild diffuse body wall edema. VASCULAR MEASUREMENTS PERTINENT TO TAVR: AORTA: Minimal Aortic Diameter-16 x 16 mm Severity of Aortic Calcification-mild RIGHT PELVIS: Right Common Iliac Artery - Minimal Diameter-9.0 x 11.1 mm Tortuosity - mild Calcification-minimal Right External Iliac Artery - Minimal Diameter-9.5 x 9.1 mm Tortuosity - mild Calcification-none Right Common Femoral Artery - Minimal Diameter-6.4 x 8.6 mm Tortuosity - mild Calcification-none LEFT PELVIS: Left Common  Iliac Artery - Minimal Diameter-11.6 x 9.9 mm Tortuosity - mild Calcification-minimal Left External Iliac Artery - Minimal Diameter-11.5 x 8.8 mm Tortuosity - mild Calcification-none Left Common Femoral Artery - Minimal Diameter-9.1 x 8.7 mm Tortuosity-mild Calcification-none Review of the MIP images confirms the above findings. IMPRESSION: 1. Vascular findings and measurements pertinent to potential TAVR procedure, as detailed above. 2. Severe thickening calcification of the aortic valve, compatible with reported clinical history of severe aortic stenosis. 3. Cardiomegaly  with evidence of mild interstitial pulmonary edema in the lungs and moderate bilateral pleural effusions; imaging findings concerning for congestive heart failure. 4. Amorphous mass-like area of enhancement along the posterior wall of the urinary bladder measuring approximately 6.4 x 2.0 x 2.5 cm. Nonemergent Urologic consultation is strongly recommended in the near future to better evaluate this finding, as the possibility of an infiltrative bladder neoplasm is not excluded. 5. There is also dilatation of the pulmonic trunk (3.6 cm in diameter), concerning for pulmonary arterial hypertension. 6. Morphologic changes in the liver suggesting early cirrhosis. 7. Small volume of ascites. 8. Mild diffuse body wall edema. 9. Additional incidental findings, as above. These results will be called to the ordering clinician or representative by the Radiologist Assistant, and communication documented in the PACS or [employer]. Electronically Signed   By: Vinnie Langton M.D.   On: 07/27/2019 12:47   ECHOCARDIOGRAM COMPLETE  Result Date: 07/21/2019    ECHOCARDIOGRAM REPORT   Patient Name:   PHARES ZACCONE Date of Exam: 07/21/2019 Medical Rec #:  [id number]   Height:       72.0 in Accession #:    [id number]  Weight:       310.0 lb Date of Birth:  12/27/1965   BSA:          2.567 m Patient Age:    68 years    BP:           102/73 mmHg Patient Gender: M           HR:           73 bpm. Exam Location:  Inpatient Procedure: 2D Echo Indications:    CHF-Acute Systolic 322.02 / R42.70  History:        Patient has no prior history of Echocardiogram examinations.                 Risk Factors:Obesity. Pleural effusion on right.  Sonographer:    Vikki Ports Turrentine Referring Phys: [place]  1. Left ventricular ejection fraction, by estimation, is 40 to 45%. The left ventricle has mildly decreased function. The left ventricle demonstrates global hypokinesis. There is moderate left ventricular hypertrophy. Left  ventricular diastolic parameters are consistent with Grade II diastolic dysfunction (pseudonormalization).  2. Right ventricular systolic function is mildly reduced. The right ventricular size is normal. There is moderately elevated pulmonary artery systolic pressure.  3. Left atrial size was moderately dilated.  4. The mitral valve is abnormal. Trivial mitral valve regurgitation.  5. Cannot exclude a bicuspid valve. Aortic valve regurgitation is mild. Critical aortic valve stenosis. Aortic valve area, by VTI measures 0.73 cm. Aortic valve mean gradient measures 96.2 mmHg. Peak gradient of 137 mmHg. Aortic valve Vmax measures 5.86 m/s.  6. The inferior vena cava is dilated in size with <50% respiratory variability, suggesting right atrial pressure of 15 mmHg.  7. Moderate pleural effusion in the right lateral region. FINDINGS  Left Ventricle: Left ventricular ejection fraction, by estimation, is 40 to 45%. The  left ventricle has mildly decreased function. The left ventricle demonstrates global hypokinesis. The left ventricular internal cavity size was normal in size. There is  moderate left ventricular hypertrophy. Left ventricular diastolic parameters are consistent with Grade II diastolic dysfunction (pseudonormalization). Indeterminate filling pressures. Right Ventricle: The right ventricular size is normal. No increase in right ventricular wall thickness. Right ventricular systolic function is mildly reduced. There is moderately elevated pulmonary artery systolic pressure. The tricuspid regurgitant velocity is 3.35 m/s, and with an assumed right atrial pressure of 15 mmHg, the estimated right ventricular systolic pressure is 87.8 mmHg. Left Atrium: Left atrial size was moderately dilated. Right Atrium: Right atrial size was normal in size. Pericardium: There is no evidence of pericardial effusion. Mitral Valve: The mitral valve is abnormal. Mild to moderate mitral annular calcification. Trivial mitral valve  regurgitation. Tricuspid Valve: The tricuspid valve is grossly normal. Tricuspid valve regurgitation is mild. Aortic Valve: The aortic valve is tricuspid. . There is moderate thickening and severe calcifcation of the aortic valve. Aortic valve regurgitation is mild. Aortic regurgitation PHT measures 374 msec. Severe aortic stenosis is present. There is moderate thickening of the aortic valve. There is severe calcifcation of the aortic valve. Aortic valve mean gradient measures 96.2 mmHg. Aortic valve peak gradient measures 137.3 mmHg. Aortic valve area, by VTI measures 0.73 cm. Pulmonic Valve: The pulmonic valve was normal in structure. Pulmonic valve regurgitation is not visualized. Aorta: The aortic root and ascending aorta are structurally normal, with no evidence of dilitation. Venous: The inferior vena cava is dilated in size with less than 50% respiratory variability, suggesting right atrial pressure of 15 mmHg. IAS/Shunts: No atrial level shunt detected by color flow Doppler. Additional Comments: There is a moderate pleural effusion in the right lateral region.  LEFT VENTRICLE PLAX 2D LVIDd:         5.50 cm  Diastology LVIDs:         4.40 cm  LV e' lateral:   9.23 cm/s LV PW:         1.40 cm  LV E/e' lateral: 10.2 LV IVS:        1.40 cm  LV e' medial:    7.05 cm/s LVOT diam:     2.50 cm  LV E/e' medial:  13.4 LV SV:         108 LV SV Index:   42 LVOT Area:     4.91 cm  RIGHT VENTRICLE RV S prime:     7.20 cm/s TAPSE (M-mode): 1.2 cm LEFT ATRIUM              Index       RIGHT ATRIUM           Index LA diam:        5.90 cm  2.30 cm/m  RA Area:     22.60 cm LA Vol (A2C):   121.0 ml 47.14 ml/m RA Volume:   68.80 ml  26.80 ml/m LA Vol (A4C):   95.5 ml  37.21 ml/m LA Biplane Vol: 110.0 ml 42.86 ml/m  AORTIC VALVE AV Area (Vmax):    0.82 cm AV Area (Vmean):   0.72 cm AV Area (VTI):     0.73 cm AV Vmax:           585.80 cm/s AV Vmean:          474.000 cm/s AV VTI:            1.480 m AV Peak Grad:  137.3 mmHg AV Mean Grad:      96.2 mmHg LVOT Vmax:         97.30 cm/s LVOT Vmean:        69.800 cm/s LVOT VTI:          0.220 m LVOT/AV VTI ratio: 0.15 AI PHT:            374 msec  AORTA Ao Root diam: 3.00 cm MITRAL VALVE               TRICUSPID VALVE MV Area (PHT): 5.54 cm    TR Peak grad:   44.9 mmHg MV Decel Time: 137 msec    TR Vmax:        335.00 cm/s MV E velocity: 94.30 cm/s MV A velocity: 46.30 cm/s  SHUNTS MV E/A ratio:  2.04        Systemic VTI:  0.22 m                            Systemic Diam: 2.50 cm Lyman Bishop MD Electronically signed by Lyman Bishop MD Signature Date/Time: 07/21/2019/3:42:23 PM    Final    ECHO INTRAOPERATIVE TEE  Result Date: 08/07/2019  *INTRAOPERATIVE TRANSESOPHAGEAL REPORT *  Patient Name:   BLAYZE HAEN Date of Exam: 08/06/2019 Medical Rec #:  [id number]   Height:       72.0 in Accession #:    [id number]  Weight:       256.8 lb Date of Birth:  07-24-1965   BSA:          2.37 m Patient Age:    40 years    BP:           94/71 mmHg Patient Gender: M           HR:           60 bpm. Exam Location:  Inpatient Transesophogeal exam was perform intraoperatively during surgical procedure. Patient was closely monitored under general anesthesia during the entirety of examination. Indications:     Aortic valve replacement Performing Phys: [id number] Pollack Diagnosing Phys: Nolon Nations MD Complications: No known complications during this procedure. POST-OP IMPRESSIONS - Left Ventricle: The left ventricle is unchanged from pre-bypass. - Aorta: The aorta appears unchanged from pre-bypass. - Left Atrial Appendage: The left atrial appendage appears unchanged from pre-bypass. - Aortic Valve: A bioprosthetic bioprosthetic valve was placed, leaflets thin and leaflets are not freely mobile Size; 30m. No regurgitation post repair. the gradient recorded across the prosthetic valve is above the expected range, measuring 17 cm/s. No perivalvular leak noted. - Mitral Valve: The mitral valve appears  unchanged from pre-bypass. - Tricuspid Valve: The tricuspid valve appears unchanged from pre-bypass. - Interatrial Septum: The interatrial septum appears unchanged from pre-bypass. PRE-OP FINDINGS  Left Ventricle: The left ventricle has moderate-severely reduced systolic function, with an ejection fraction of 30-35%. The cavity size was mildly dilated. There is moderate concentric left ventricular hypertrophy. Right Ventricle: The right ventricle has moderately reduced systolic function. The cavity was moderately enlarged. There is no increase in right ventricular wall thickness. Left Atrium: Left atrial size was normal in size. The left atrial appendage is well visualized and there is evidence of thrombus present. The left atrial appendage is well visualized and there is no evidence of thrombus present. Right Atrium: Right atrial size was dilated. Right atrial pressure is estimated at 10 mmHg. Interatrial Septum: No atrial level shunt detected  by color flow Doppler. Pericardium: There is no evidence of pericardial effusion. There is a moderate pleural effusion in the right lateral region. Mitral Valve: The mitral valve is normal in structure. Mitral valve regurgitation is mild by color flow Doppler. Tricuspid Valve: The tricuspid valve was normal in structure. Tricuspid valve regurgitation is mild-moderate by color flow Doppler. The tricuspid valve is mildly thickened. Aortic Valve: The aortic valve was not well visualized There is severe thickening of the aortic valve and There is severe calcifcation of the aortic valve Aortic valve regurgitation is moderate to severe by color flow Doppler. There is severe stenosis of  the aortic valve. Pulmonic Valve: The pulmonic valve was normal in structure. Pulmonic valve regurgitation is not visualized by color flow Doppler. Aorta: There is evidence of atheroma plaque in the descending aorta; Grade I, measuring 1-61m in size. +------------+----------+++ RIGHT ATRIUM            +------------+----------+++ RA Pressure:24.00 mmHg +------------+----------+++  +-------------+---------++ AORTIC VALVE           +-------------+---------++ AV Mean Grad:85.0 mmHg +-------------+---------++ +---------------+----------++ TRICUSPID VALVE           +---------------+----------++ Estimated RAP: 24.00 mmHg +---------------+----------++  JNolon NationsMD Electronically signed by JNolon NationsMD Signature Date/Time: 08/07/2019/6:05:15 PM    Final    VAS [place] PRE CABG  Result Date: 08/04/2019 PREOPERATIVE VASCULAR EVALUATION  Indications: Pre AVR. Performing Technologist: CAntonieta PertRDMS, RVT  Examination Guidelines: A complete evaluation includes B-mode imaging, spectral Doppler, color Doppler, and power Doppler as needed of all accessible portions of each vessel. Bilateral testing is considered an integral part of a complete examination. Limited examinations for reoccurring indications may be performed as noted.  ABI Findings: +--------+------------------+-----+---------+--------+ Right   Rt Pressure (mmHg)IndexWaveform Comment  +--------+------------------+-----+---------+--------+ [id number]                   triphasic         +--------+------------------+-----+---------+--------+ +--------+------------------+-----+---------+--------------------+ Left    Lt Pressure (mmHg)IndexWaveform Comment              +--------+------------------+-----+---------+--------------------+ Brachial                       triphasicrestricted extremity +--------+------------------+-----+---------+--------------------+  Right Doppler Findings: +--------+--------+-----+---------+--------+ Site    PressureIndexDoppler  Comments +--------+--------+-----+---------+--------+ [id number]         triphasic         +--------+--------+-----+---------+--------+ Radial               triphasic         +--------+--------+-----+---------+--------+ Ulnar                 triphasic         +--------+--------+-----+---------+--------+  Left Doppler Findings: +--------+--------+-----+---------+--------------------+ Site    PressureIndexDoppler  Comments             +--------+--------+-----+---------+--------------------+ Brachial             triphasicrestricted extremity +--------+--------+-----+---------+--------------------+ Radial               triphasic                     +--------+--------+-----+---------+--------------------+ Ulnar                triphasic                     +--------+--------+-----+---------+--------------------+  Right Upper Extremity: Doppler waveforms remain within normal limits with right radial  compression. Doppler waveform obliterate with right ulnar compression. Left Upper Extremity: Doppler waveforms remain within normal limits with left radial compression. Doppler waveform obliterate with left ulnar compression.  Electronically signed by Deitra Mayo MD on 08/04/2019 at 6:03:52 PM.   Final    VAS US CAROTID  Result Date: 07/27/2019 Carotid Arterial Duplex Study Indications:       Covid-19. Other Factors:     Critical Aortic Stenosis, CHF, Morbid Obesity. Limitations        Today's exam was limited due to the body habitus of the                    patient. Comparison Study:  No prior study on file for comparison Performing Technologist: Sharion Dove RVS  Examination Guidelines: A complete evaluation includes B-mode imaging, spectral Doppler, color Doppler, and power Doppler as needed of all accessible portions of each vessel. Bilateral testing is considered an integral part of a complete examination. Limited examinations for reoccurring indications may be performed as noted.  Right Carotid Findings: +----------+--------+--------+--------+------------------+------------------+           PSV cm/sEDV cm/sStenosisPlaque DescriptionComments            +----------+--------+--------+--------+------------------+------------------+ CCA Prox  41      7                                 intimal thickening +----------+--------+--------+--------+------------------+------------------+ CCA Distal51      12                                intimal thickening +----------+--------+--------+--------+------------------+------------------+ ICA Prox  42      12              heterogenous                         +----------+--------+--------+--------+------------------+------------------+ ICA Distal48      15                                                   +----------+--------+--------+--------+------------------+------------------+ ECA       62      10                                                   +----------+--------+--------+--------+------------------+------------------+ +----------+--------+-------+--------+-------------------+           PSV cm/sEDV cmsDescribeArm Pressure (mmHG) +----------+--------+-------+--------+-------------------+ [id number]                                         +----------+--------+-------+--------+-------------------+ +---------+--------+--+--------+-+ VertebralPSV cm/s28EDV cm/s9 +---------+--------+--+--------+-+  Left Carotid Findings: +----------+--------+--------+--------+------------------+------------------+           PSV cm/sEDV cm/sStenosisPlaque DescriptionComments           +----------+--------+--------+--------+------------------+------------------+ CCA Prox  73      12  intimal thickening +----------+--------+--------+--------+------------------+------------------+ CCA Distal43      9                                 intimal thickening +----------+--------+--------+--------+------------------+------------------+ ICA Prox  54      11                                                    +----------+--------+--------+--------+------------------+------------------+ ICA Distal67      19                                                   +----------+--------+--------+--------+------------------+------------------+ ECA       53      5                                                    +----------+--------+--------+--------+------------------+------------------+ +----------+--------+--------+--------+-------------------+           PSV cm/sEDV cm/sDescribeArm Pressure (mmHG) +----------+--------+--------+--------+-------------------+ Subclavian113                                         +----------+--------+--------+--------+-------------------+ +---------+--------+--+--------+--+ VertebralPSV cm/s75EDV cm/s20 +---------+--------+--+--------+--+   Summary: Right Carotid: The extracranial vessels were near-normal with only minimal wall                thickening or plaque. Left Carotid: The extracranial vessels were near-normal with only minimal wall               thickening or plaque. Vertebrals:  Bilateral vertebral arteries demonstrate antegrade flow. Subclavians: Normal flow hemodynamics were seen in bilateral subclavian              arteries. *See table(s) above for measurements and observations.  Electronically signed by Servando Snare MD on 07/27/2019 at 12:53:06 AM.    Final    VAS [place] LOWER EXTREMITY VENOUS (DVT)  Result Date: 07/23/2019  Lower Venous DVTStudy Indications: Edema, and elevated ddimer.  Limitations: Body habitus and poor ultrasound/tissue interface. Comparison Study: no prior Performing Technologist: Abram Sander RVS  Examination Guidelines: A complete evaluation includes B-mode imaging, spectral Doppler, color Doppler, and power Doppler as needed of all accessible portions of each vessel. Bilateral testing is considered an integral part of a complete examination. Limited examinations for reoccurring indications may be performed as noted. The reflux  portion of the exam is performed with the patient in reverse Trendelenburg.  +---------+---------------+---------+-----------+----------+--------------+ RIGHT    CompressibilityPhasicitySpontaneityPropertiesThrombus Aging +---------+---------------+---------+-----------+----------+--------------+ CFV      Full           Yes      Yes                                 +---------+---------------+---------+-----------+----------+--------------+ SFJ      Full                                                        +---------+---------------+---------+-----------+----------+--------------+  FV Prox  Full                                                        +---------+---------------+---------+-----------+----------+--------------+ FV Mid                  Yes      Yes                                 +---------+---------------+---------+-----------+----------+--------------+ FV Distal               Yes      Yes                                 +---------+---------------+---------+-----------+----------+--------------+ PFV      Full                                                        +---------+---------------+---------+-----------+----------+--------------+ POP      Full           Yes      Yes                                 +---------+---------------+---------+-----------+----------+--------------+ PTV      Full                                                        +---------+---------------+---------+-----------+----------+--------------+ PERO     Full                                                        +---------+---------------+---------+-----------+----------+--------------+   +---------+---------------+---------+-----------+----------+--------------+ LEFT     CompressibilityPhasicitySpontaneityPropertiesThrombus Aging +---------+---------------+---------+-----------+----------+--------------+ CFV      Full           Yes      Yes                                  +---------+---------------+---------+-----------+----------+--------------+ SFJ      Full                                                        +---------+---------------+---------+-----------+----------+--------------+ FV Prox  Full                                                        +---------+---------------+---------+-----------+----------+--------------+  FV Mid   Full                                                        +---------+---------------+---------+-----------+----------+--------------+ FV Distal               Yes      Yes                                 +---------+---------------+---------+-----------+----------+--------------+ PFV      Full                                                        +---------+---------------+---------+-----------+----------+--------------+ POP      Full           Yes      Yes                                 +---------+---------------+---------+-----------+----------+--------------+ PTV      Full                                                        +---------+---------------+---------+-----------+----------+--------------+ PERO                                                  Not visualized +---------+---------------+---------+-----------+----------+--------------+     Summary: BILATERAL: - No evidence of deep vein thrombosis seen in the lower extremities, bilaterally. -   *See table(s) above for measurements and observations. Electronically signed by Monica Martinez MD on 07/23/2019 at 5:31:24 PM.    Final    [place] EKG SITE RITE  Result Date: 08/08/2019 If Site Rite image not attached, placement could not be confirmed due to current cardiac rhythm.  CT Angio Abd/Pel w/ and/or w/o  Result Date: 07/27/2019 CLINICAL DATA:  54 year old male with history of severe aortic stenosis. Preprocedural study prior to potential transcatheter aortic valve replacement (TAVR) procedure. EXAM:  CT ANGIOGRAPHY CHEST, ABDOMEN AND PELVIS TECHNIQUE: Non-contrast CT of the chest was initially obtained. Multidetector CT imaging through the chest, abdomen and pelvis was performed using the standard protocol during bolus administration of intravenous contrast. Multiplanar reconstructed images and MIPs were obtained and reviewed to evaluate the vascular anatomy. CONTRAST:  131m OMNIPAQUE IOHEXOL 350 MG/ML SOLN COMPARISON:  No priors. FINDINGS: CTA CHEST FINDINGS Cardiovascular: Heart size is mildly enlarged. There is no significant pericardial fluid, thickening or pericardial calcification. Aortic atherosclerosis. No definite coronary artery calcifications. Severe thickening calcification of the aortic valve. Dilatation of the pulmonic trunk (3.6 cm in diameter). Mediastinum/Lymph Nodes: No pathologically enlarged mediastinal or hilar lymph nodes. Esophagus is unremarkable in appearance. No axillary lymphadenopathy. Lungs/Pleura: Patchy but diffuse ground-glass attenuation and mild interlobular septal thickening noted throughout the lungs bilaterally, suggesting a  background of mild interstitial pulmonary edema. Moderate bilateral pleural effusions lying dependently with some associated passive subsegmental atelectasis in the lower lobes of the lungs bilaterally. No definite suspicious appearing pulmonary nodules or masses. Musculoskeletal/Soft Tissues: Old healed fracture of the mid sternum with mild posttraumatic deformity. There are no aggressive appearing lytic or blastic lesions noted in the visualized portions of the skeleton. CTA ABDOMEN AND PELVIS FINDINGS Hepatobiliary: Liver has a slightly shrunken appearance and nodular contour, indicative of underlying cirrhosis. No discrete cystic or solid hepatic lesions. No intra or extrahepatic biliary ductal dilatation. Gallbladder is nearly completely decompressed, and otherwise unremarkable in appearance. Pancreas: No pancreatic mass. No pancreatic ductal  dilatation. No pancreatic or peripancreatic fluid collections or inflammatory changes. Spleen: Unremarkable. Adrenals/Urinary Tract: Left kidney and bilateral adrenal glands are normal in appearance. In the lower pole of the right kidney there is an exophytic low-attenuation nonenhancing lesion measuring 8 cm in diameter, compatible with a simple cyst. No hydroureteronephrosis. Amorphous mass-like area of enhancement along the posterior wall of the urinary bladder measuring approximately 6.4 x 2.0 x 2.5 cm (axial image 214 of series 6 and sagittal image 111 of series 9). Stomach/Bowel: Appearance of the stomach is normal. No pathologic dilatation of small bowel or colon. A few scattered colonic diverticulae are noted, without definite surrounding inflammatory changes to suggest an acute diverticulitis (study is limited by presence of small volume of ascites). Normal appendix. Vascular/Lymphatic: Aortic atherosclerosis with vascular findings and measurements pertinent to potential TAVR procedure, as detailed below. No aneurysm or dissection noted in the abdominal or pelvic vasculature. No lymphadenopathy noted in the abdomen or pelvis. Reproductive: Prostate gland and seminal vesicles are unremarkable in appearance. Other: Small volume of ascites.  No pneumoperitoneum. Musculoskeletal: There are no aggressive appearing lytic or blastic lesions noted in the visualized portions of the skeleton. Mild diffuse body wall edema. VASCULAR MEASUREMENTS PERTINENT TO TAVR: AORTA: Minimal Aortic Diameter-16 x 16 mm Severity of Aortic Calcification-mild RIGHT PELVIS: Right Common Iliac Artery - Minimal Diameter-9.0 x 11.1 mm Tortuosity - mild Calcification-minimal Right External Iliac Artery - Minimal Diameter-9.5 x 9.1 mm Tortuosity - mild Calcification-none Right Common Femoral Artery - Minimal Diameter-6.4 x 8.6 mm Tortuosity - mild Calcification-none LEFT PELVIS: Left Common Iliac Artery - Minimal Diameter-11.6 x 9.9 mm  Tortuosity - mild Calcification-minimal Left External Iliac Artery - Minimal Diameter-11.5 x 8.8 mm Tortuosity - mild Calcification-none Left Common Femoral Artery - Minimal Diameter-9.1 x 8.7 mm Tortuosity-mild Calcification-none Review of the MIP images confirms the above findings. IMPRESSION: 1. Vascular findings and measurements pertinent to potential TAVR procedure, as detailed above. 2. Severe thickening calcification of the aortic valve, compatible with reported clinical history of severe aortic stenosis. 3. Cardiomegaly with evidence of mild interstitial pulmonary edema in the lungs and moderate bilateral pleural effusions; imaging findings concerning for congestive heart failure. 4. Amorphous mass-like area of enhancement along the posterior wall of the urinary bladder measuring approximately 6.4 x 2.0 x 2.5 cm. Nonemergent Urologic consultation is strongly recommended in the near future to better evaluate this finding, as the possibility of an infiltrative bladder neoplasm is not excluded. 5. There is also dilatation of the pulmonic trunk (3.6 cm in diameter), concerning for pulmonary arterial hypertension. 6. Morphologic changes in the liver suggesting early cirrhosis. 7. Small volume of ascites. 8. Mild diffuse body wall edema. 9. Additional incidental findings, as above. These results will be called to the ordering clinician or representative by the Radiologist Assistant, and communication documented in the PACS  or [employer]. Electronically Signed   By: Vinnie Langton M.D.   On: 07/27/2019 12:47       Discharge Instructions    Amb Referral to Cardiac Rehabilitation   Complete by: As directed    Diagnosis: Valve Replacement   Valve: Aortic   After initial evaluation and assessments completed: Virtual Based Care may be provided alone or in conjunction with Phase 2 Cardiac Rehab based on patient barriers.: Yes      Discharge Medications: Allergies as of 08/17/2019   No Known  Allergies     Medication List    STOP taking these medications   cephALEXin 500 MG capsule Commonly known as: KEFLEX     TAKE these medications   acetaminophen 325 MG tablet Commonly known as: TYLENOL Take 2 tablets (650 mg total) by mouth every 6 (six) hours as needed for mild pain.   aspirin 325 MG EC tablet Take 1 tablet (325 mg total) by mouth daily. Start taking on: August 18, 2019   blood glucose meter kit and supplies Kit Dispense based on patient and insurance preference. Please take your blood glucose level TID.  (FOR ICD-9 250.00, 250.01).   carvedilol 3.125 MG tablet Commonly known as: COREG Take 1 tablet (3.125 mg total) by mouth 2 (two) times daily with a meal.   feeding supplement (ENSURE ENLIVE) Liqd Take 237 mLs by mouth 2 (two) times daily between meals.   feeding supplement (PRO-STAT SUGAR FREE 64) Liqd Take 30 mLs by mouth daily. Start taking on: August 18, 2019   furosemide 40 MG tablet Commonly known as: LASIX Take 40 mg by mouth daily.   glucose blood test strip Use as instructed   levothyroxine 25 MCG tablet Commonly known as: SYNTHROID Take 1 tablet (25 mcg total) by mouth daily at 6 (six) AM. Start taking on: August 18, 2019   living well with diabetes book Misc 1 each by Does not apply route once for 1 dose.   metFORMIN 500 MG tablet Commonly known as: GLUCOPHAGE Take 1 tablet (500 mg total) by mouth 2 (two) times daily with a meal.   multivitamin tablet Take 1 tablet by mouth daily.   onetouch ultrasoft lancets Please check your blood sugar TID at home. Record for PCP to look at and review.            Durable Medical Equipment  (From admission, onward)         Start     Ordered   08/17/19 1149  For home use only DME 3 n 1  Once        08/17/19 1149   08/17/19 1149  For home use only DME Walker  Once       Question:  Patient needs a walker to treat with the following condition  Answer:  Debility   08/17/19 1149          The patient has been discharged on:   1.Beta Blocker:  Yes [ yes ]                              No   [   ]                              If No, reason:  2.Ace Inhibitor/ARB: Yes [ yes  ]  No  [    ]                                     If No, reason:  3.Statin:   Yes [   ]                  No  [ no ]                  If No, reason:  4.Ecasa:  Yes  [ yes  ]                  No   [   ]                  If No, reason:  Follow Up Appointments:  Follow-up Information    Gaye Pollack, MD. Go on 08/24/2019.   Specialty: Cardiothoracic Surgery Why: Your routine follow-up appointment is on 6/21 @9 :30am. Please arrive at 9:00am for a chest xray located at [facility] which is on the first floor of our building. Contact information: [address] [place] [address] [phone]        Richardson Dopp T, PA-C. Go on 08/25/2019.   Specialties: Cardiology, Physician Assistant Why: Appointment time is 2:15 pm Contact information: [address] [address] [phone]        Jerline Pain, MD. Call in 1 day(s).   Specialty: Cardiology Contact information: [address] [address] [phone]        [facility]. Schedule an appointment as soon as possible for a visit.   Contact information: [place] [phone]              Signed: Terance Hart ContePA-C 08/17/2019, 12:40 PM

## 2019-08-07 ENCOUNTER — Inpatient Hospital Stay (HOSPITAL_COMMUNITY): Payer: 59

## 2019-08-07 DIAGNOSIS — Z952 Presence of prosthetic heart valve: Secondary | ICD-10-CM

## 2019-08-07 LAB — POCT I-STAT 7, (LYTES, BLD GAS, ICA,H+H)
Acid-Base Excess: 2 mmol/L (ref 0.0–2.0)
Acid-base deficit: 1 mmol/L (ref 0.0–2.0)
Bicarbonate: 25.2 mmol/L (ref 20.0–28.0)
Bicarbonate: 26.5 mmol/L (ref 20.0–28.0)
Calcium, Ion: 1.09 mmol/L — ABNORMAL LOW (ref 1.15–1.40)
Calcium, Ion: 1.08 mmol/L — ABNORMAL LOW (ref 1.15–1.40)
HCT: 27 % — ABNORMAL LOW (ref 39.0–52.0)
HCT: 22 % — ABNORMAL LOW (ref 39.0–52.0)
Hemoglobin: 9.2 g/dL — ABNORMAL LOW (ref 13.0–17.0)
Hemoglobin: 7.5 g/dL — ABNORMAL LOW (ref 13.0–17.0)
O2 Saturation: 99 %
O2 Saturation: 99 %
Patient temperature: 36
Patient temperature: 97
Potassium: 4 mmol/L (ref 3.5–5.1)
Potassium: 3.8 mmol/L (ref 3.5–5.1)
Sodium: 140 mmol/L (ref 135–145)
Sodium: 142 mmol/L (ref 135–145)
TCO2: 27 mmol/L (ref 22–32)
TCO2: 28 mmol/L (ref 22–32)
pCO2 arterial: 46.2 mmHg (ref 32.0–48.0)
pCO2 arterial: 38.6 mmHg (ref 32.0–48.0)
pH, Arterial: 7.34 — ABNORMAL LOW (ref 7.350–7.450)
pH, Arterial: 7.441 (ref 7.350–7.450)
pO2, Arterial: 166 mmHg — ABNORMAL HIGH (ref 83.0–108.0)
pO2, Arterial: 119 mmHg — ABNORMAL HIGH (ref 83.0–108.0)

## 2019-08-07 LAB — BASIC METABOLIC PANEL
Anion gap: 8 (ref 5–15)
BUN: 12 mg/dL (ref 6–20)
CO2: 24 mmol/L (ref 22–32)
Calcium: 7.2 mg/dL — ABNORMAL LOW (ref 8.9–10.3)
Chloride: 105 mmol/L (ref 98–111)
Creatinine, Ser: 0.65 mg/dL (ref 0.61–1.24)
GFR calc Af Amer: >60 mL/min (ref >60)
GFR calc non Af Amer: >60 mL/min (ref >60)
Glucose, Bld: 138 mg/dL — ABNORMAL HIGH (ref 70–99)
Potassium: 4 mmol/L (ref 3.5–5.1)
Sodium: 137 mmol/L (ref 135–145)

## 2019-08-07 LAB — CBC
HCT: 29 % — ABNORMAL LOW (ref 39.0–52.0)
HCT: 27.8 % — ABNORMAL LOW (ref 39.0–52.0)
HCT: 25.9 % — ABNORMAL LOW (ref 39.0–52.0)
Hemoglobin: 9.4 g/dL — ABNORMAL LOW (ref 13.0–17.0)
Hemoglobin: 9.3 g/dL — ABNORMAL LOW (ref 13.0–17.0)
Hemoglobin: 8.4 g/dL — ABNORMAL LOW (ref 13.0–17.0)
MCH: 30.4 pg (ref 26.0–34.0)
MCH: 30.8 pg (ref 26.0–34.0)
MCH: 30.3 pg (ref 26.0–34.0)
MCHC: 32.4 g/dL (ref 30.0–36.0)
MCHC: 33.5 g/dL (ref 30.0–36.0)
MCHC: 32.4 g/dL (ref 30.0–36.0)
MCV: 93.9 fL (ref 80.0–100.0)
MCV: 92.1 fL (ref 80.0–100.0)
MCV: 93.5 fL (ref 80.0–100.0)
Platelets: 122 10*3/uL — ABNORMAL LOW (ref 150–400)
Platelets: 101 10*3/uL — ABNORMAL LOW (ref 150–400)
Platelets: 118 10*3/uL — ABNORMAL LOW (ref 150–400)
RBC: 3.09 MIL/uL — ABNORMAL LOW (ref 4.22–5.81)
RBC: 3.02 MIL/uL — ABNORMAL LOW (ref 4.22–5.81)
RBC: 2.77 MIL/uL — ABNORMAL LOW (ref 4.22–5.81)
RDW: 16.3 % — ABNORMAL HIGH (ref 11.5–15.5)
RDW: 17.1 % — ABNORMAL HIGH (ref 11.5–15.5)
RDW: 17.8 % — ABNORMAL HIGH (ref 11.5–15.5)
WBC: 13.1 10*3/uL — ABNORMAL HIGH (ref 4.0–10.5)
WBC: 11.9 10*3/uL — ABNORMAL HIGH (ref 4.0–10.5)
WBC: 16.3 10*3/uL — ABNORMAL HIGH (ref 4.0–10.5)
nRBC: 0.2 % (ref 0.0–0.2)
nRBC: 0.2 % (ref 0.0–0.2)
nRBC: 0.2 % (ref 0.0–0.2)

## 2019-08-07 LAB — POCT I-STAT, CHEM 8
BUN: 14 mg/dL (ref 6–20)
Calcium, Ion: 1.12 mmol/L — ABNORMAL LOW (ref 1.15–1.40)
Chloride: 101 mmol/L (ref 98–111)
Creatinine, Ser: 0.6 mg/dL — ABNORMAL LOW (ref 0.61–1.24)
Glucose, Bld: 176 mg/dL — ABNORMAL HIGH (ref 70–99)
HCT: 24 % — ABNORMAL LOW (ref 39.0–52.0)
Hemoglobin: 8.2 g/dL — ABNORMAL LOW (ref 13.0–17.0)
Potassium: 3.9 mmol/L (ref 3.5–5.1)
Sodium: 139 mmol/L (ref 135–145)
TCO2: 23 mmol/L (ref 22–32)

## 2019-08-07 LAB — PREPARE FRESH FROZEN PLASMA
Unit division: 0
Unit division: 0

## 2019-08-07 LAB — BPAM CRYOPRECIPITATE
Blood Product Expiration Date: 202106032355
Blood Product Expiration Date: 202106032355
ISSUE DATE / TIME: 202106031829
ISSUE DATE / TIME: 202106031829
Unit Type and Rh: 6200
Unit Type and Rh: 6200

## 2019-08-07 LAB — BPAM FFP
Blood Product Expiration Date: 202106082359
Blood Product Expiration Date: 202106082359
ISSUE DATE / TIME: 202106031829
ISSUE DATE / TIME: 202106031829
Unit Type and Rh: 6200
Unit Type and Rh: 6200

## 2019-08-07 LAB — PREPARE CRYOPRECIPITATE
Unit division: 0
Unit division: 0

## 2019-08-07 LAB — GLUCOSE, CAPILLARY
Glucose-Capillary: 102 mg/dL — ABNORMAL HIGH (ref 70–99)
Glucose-Capillary: 105 mg/dL — ABNORMAL HIGH (ref 70–99)
Glucose-Capillary: 122 mg/dL — ABNORMAL HIGH (ref 70–99)
Glucose-Capillary: 124 mg/dL — ABNORMAL HIGH (ref 70–99)
Glucose-Capillary: 127 mg/dL — ABNORMAL HIGH (ref 70–99)
Glucose-Capillary: 142 mg/dL — ABNORMAL HIGH (ref 70–99)
Glucose-Capillary: 143 mg/dL — ABNORMAL HIGH (ref 70–99)
Glucose-Capillary: 145 mg/dL — ABNORMAL HIGH (ref 70–99)
Glucose-Capillary: 149 mg/dL — ABNORMAL HIGH (ref 70–99)
Glucose-Capillary: 158 mg/dL — ABNORMAL HIGH (ref 70–99)
Glucose-Capillary: 162 mg/dL — ABNORMAL HIGH (ref 70–99)
Glucose-Capillary: 210 mg/dL — ABNORMAL HIGH (ref 70–99)

## 2019-08-07 LAB — PROTIME-INR
INR: 1.3 — ABNORMAL HIGH (ref 0.8–1.2)
Prothrombin Time: 15.8 seconds — ABNORMAL HIGH (ref 11.4–15.2)

## 2019-08-07 LAB — COOXEMETRY PANEL
Carboxyhemoglobin: 1.7 % — ABNORMAL HIGH (ref 0.5–1.5)
Methemoglobin: 0.7 % (ref 0.0–1.5)
O2 Saturation: 49.8 %
Total hemoglobin: 9 g/dL — ABNORMAL LOW (ref 12.0–16.0)

## 2019-08-07 LAB — ECHO INTRAOPERATIVE TEE
Height: 72 in
Weight: 4109.37 oz

## 2019-08-07 LAB — SURGICAL PATHOLOGY

## 2019-08-07 LAB — APTT: aPTT: 34 seconds (ref 24–36)

## 2019-08-07 MED ORDER — ORAL CARE MOUTH RINSE
15.0000 mL | OROMUCOSAL | Status: DC
  Administered 2019-08-07 (×3): 15 mL via OROMUCOSAL

## 2019-08-07 MED ORDER — CHLORHEXIDINE GLUCONATE 0.12 % MT SOLN
15.0000 mL | OROMUCOSAL | Status: AC
  Filled 2019-08-07: qty 15

## 2019-08-07 MED ORDER — INSULIN DETEMIR 100 UNIT/ML ~~LOC~~ SOLN
20.0000 [IU] | Freq: Every day | SUBCUTANEOUS | Status: DC
  Administered 2019-08-08: 20 [IU] via SUBCUTANEOUS
  Filled 2019-08-07 (×3): qty 0.2

## 2019-08-07 MED ORDER — SODIUM CHLORIDE 0.9 % IV SOLN
1.5000 g | Freq: Two times a day (BID) | INTRAVENOUS | Status: AC
  Administered 2019-08-07 – 2019-08-08 (×4): 1.5 g via INTRAVENOUS
  Filled 2019-08-07 (×4): qty 1.5

## 2019-08-07 MED ORDER — INSULIN REGULAR(HUMAN) IN NACL 100-0.9 UT/100ML-% IV SOLN
INTRAVENOUS | Status: DC
  Administered 2019-08-07: 2.6 [IU]/h via INTRAVENOUS

## 2019-08-07 MED ORDER — POTASSIUM CHLORIDE 10 MEQ/50ML IV SOLN: 10.0000 meq | INTRAVENOUS | Status: AC

## 2019-08-07 MED ORDER — LACTATED RINGERS IV SOLN
INTRAVENOUS | Status: DC
  Administered 2019-08-07: 800 mL via INTRAVENOUS

## 2019-08-07 MED ORDER — MILRINONE LACTATE IN DEXTROSE 20-5 MG/100ML-% IV SOLN: 0.2000 ug/kg/min | INTRAVENOUS | Status: DC

## 2019-08-07 MED ORDER — DOCUSATE SODIUM 100 MG PO CAPS
200.0000 mg | ORAL_CAPSULE | Freq: Every day | ORAL | Status: DC
  Administered 2019-08-08 – 2019-08-10 (×3): 200 mg via ORAL
  Filled 2019-08-07 (×3): qty 2

## 2019-08-07 MED ORDER — FUROSEMIDE 10 MG/ML IJ SOLN
40.0000 mg | Freq: Two times a day (BID) | INTRAMUSCULAR | Status: AC
  Administered 2019-08-07 – 2019-08-08 (×2): 40 mg via INTRAVENOUS
  Filled 2019-08-07 (×2): qty 4

## 2019-08-07 MED ORDER — ONDANSETRON HCL 4 MG/2ML IJ SOLN: 4.0000 mg | Freq: Four times a day (QID) | INTRAMUSCULAR | Status: DC | PRN

## 2019-08-07 MED ORDER — ASPIRIN EC 325 MG PO TBEC
325.0000 mg | DELAYED_RELEASE_TABLET | Freq: Every day | ORAL | Status: DC
  Administered 2019-08-10: 325 mg via ORAL
  Filled 2019-08-07: qty 1

## 2019-08-07 MED ORDER — EPINEPHRINE PF 1 MG/ML IJ SOLN
0.0000 ug/min | INTRAVENOUS | Status: DC
  Filled 2019-08-07: qty 4

## 2019-08-07 MED ORDER — ASPIRIN 81 MG PO CHEW
324.0000 mg | CHEWABLE_TABLET | Freq: Every day | ORAL | Status: DC
  Administered 2019-08-07 – 2019-08-09 (×3): 324 mg
  Filled 2019-08-07 (×4): qty 4

## 2019-08-07 MED ORDER — MIDAZOLAM HCL 2 MG/2ML IJ SOLN: 2.0000 mg | INTRAMUSCULAR | Status: DC | PRN

## 2019-08-07 MED ORDER — MORPHINE SULFATE (PF) 2 MG/ML IV SOLN: 1.0000 mg | INTRAVENOUS | Status: DC | PRN

## 2019-08-07 MED ORDER — ENSURE ENLIVE PO LIQD
237.0000 mL | Freq: Two times a day (BID) | ORAL | Status: DC
  Administered 2019-08-08 – 2019-08-17 (×11): 237 mL via ORAL

## 2019-08-07 MED ORDER — METOCLOPRAMIDE HCL 5 MG/ML IJ SOLN
10.0000 mg | Freq: Four times a day (QID) | INTRAMUSCULAR | Status: AC
  Administered 2019-08-07 – 2019-08-08 (×8): 10 mg via INTRAVENOUS
  Filled 2019-08-07 (×8): qty 2

## 2019-08-07 MED ORDER — CHLORHEXIDINE GLUCONATE CLOTH 2 % EX PADS
6.0000 | MEDICATED_PAD | Freq: Every day | CUTANEOUS | Status: DC
  Administered 2019-08-07 – 2019-08-10 (×4): 6 via TOPICAL

## 2019-08-07 MED ORDER — DEXTROSE 50 % IV SOLN: 0.0000 mL | INTRAVENOUS | Status: DC | PRN

## 2019-08-07 MED ORDER — MILRINONE LACTATE IN DEXTROSE 20-5 MG/100ML-% IV SOLN
0.2000 ug/kg/min | INTRAVENOUS | Status: DC
  Administered 2019-08-07: 0.3 ug/kg/min via INTRAVENOUS
  Administered 2019-08-07 – 2019-08-09 (×3): 0.2 ug/kg/min via INTRAVENOUS
  Filled 2019-08-07 (×3): qty 100

## 2019-08-07 MED ORDER — SODIUM CHLORIDE 0.9% FLUSH
3.0000 mL | Freq: Two times a day (BID) | INTRAVENOUS | Status: DC
  Administered 2019-08-07: 3 mL via INTRAVENOUS

## 2019-08-07 MED ORDER — BISACODYL 10 MG RE SUPP: 10.0000 mg | Freq: Every day | RECTAL | Status: DC

## 2019-08-07 MED ORDER — TRAMADOL HCL 50 MG PO TABS: 50.0000 mg | ORAL_TABLET | ORAL | Status: DC | PRN

## 2019-08-07 MED ORDER — FAMOTIDINE IN NACL 20-0.9 MG/50ML-% IV SOLN
20.0000 mg | Freq: Two times a day (BID) | INTRAVENOUS | Status: AC
  Administered 2019-08-07 (×2): 20 mg via INTRAVENOUS
  Filled 2019-08-07: qty 50

## 2019-08-07 MED ORDER — NITROGLYCERIN IN D5W 200-5 MCG/ML-% IV SOLN: 0.0000 ug/min | INTRAVENOUS | Status: DC

## 2019-08-07 MED ORDER — POTASSIUM CHLORIDE 10 MEQ/50ML IV SOLN
10.0000 meq | INTRAVENOUS | Status: AC
  Administered 2019-08-07 (×3): 10 meq via INTRAVENOUS
  Filled 2019-08-07 (×3): qty 50

## 2019-08-07 MED ORDER — SODIUM CHLORIDE 0.9 % IV SOLN
INTRAVENOUS | Status: DC
  Administered 2019-08-07: 500 mL via INTRAVENOUS

## 2019-08-07 MED ORDER — BISACODYL 5 MG PO TBEC
10.0000 mg | DELAYED_RELEASE_TABLET | Freq: Every day | ORAL | Status: DC
  Administered 2019-08-08 – 2019-08-10 (×3): 10 mg via ORAL
  Filled 2019-08-07 (×3): qty 2

## 2019-08-07 MED ORDER — ACETAMINOPHEN 160 MG/5ML PO SOLN
650.0000 mg | Freq: Once | ORAL | Status: DC
  Filled 2019-08-07: qty 20.3

## 2019-08-07 MED ORDER — INSULIN ASPART 100 UNIT/ML ~~LOC~~ SOLN
0.0000 [IU] | SUBCUTANEOUS | Status: DC
  Administered 2019-08-07: 2 [IU] via SUBCUTANEOUS
  Administered 2019-08-07: 8 [IU] via SUBCUTANEOUS
  Administered 2019-08-08: 4 [IU] via SUBCUTANEOUS
  Administered 2019-08-08: 2 [IU] via SUBCUTANEOUS

## 2019-08-07 MED ORDER — ACETAMINOPHEN 500 MG PO TABS
1000.0000 mg | ORAL_TABLET | Freq: Four times a day (QID) | ORAL | Status: DC
  Administered 2019-08-08 – 2019-08-10 (×11): 1000 mg via ORAL
  Filled 2019-08-07 (×10): qty 2

## 2019-08-07 MED ORDER — PANTOPRAZOLE SODIUM 40 MG PO TBEC
40.0000 mg | DELAYED_RELEASE_TABLET | Freq: Every day | ORAL | Status: DC
  Administered 2019-08-08 – 2019-08-10 (×3): 40 mg via ORAL
  Filled 2019-08-07 (×3): qty 1

## 2019-08-07 MED ORDER — ALBUMIN HUMAN 5 % IV SOLN: 250.0000 mL | INTRAVENOUS | Status: DC | PRN

## 2019-08-07 MED ORDER — INSULIN DETEMIR 100 UNIT/ML ~~LOC~~ SOLN
20.0000 [IU] | Freq: Once | SUBCUTANEOUS | Status: AC
  Administered 2019-08-07: 20 [IU] via SUBCUTANEOUS
  Filled 2019-08-07 (×2): qty 0.2

## 2019-08-07 MED ORDER — LACTATED RINGERS IV SOLN: INTRAVENOUS | Status: DC

## 2019-08-07 MED ORDER — OXYCODONE HCL 5 MG PO TABS: 5.0000 mg | ORAL_TABLET | ORAL | Status: DC | PRN

## 2019-08-07 MED ORDER — NOREPINEPHRINE 4 MG/250ML-% IV SOLN
0.0000 ug/min | INTRAVENOUS | Status: DC
  Administered 2019-08-07: 6 ug/min via INTRAVENOUS

## 2019-08-07 MED ORDER — SODIUM CHLORIDE 0.9% FLUSH: 3.0000 mL | INTRAVENOUS | Status: DC | PRN

## 2019-08-07 MED ORDER — PHENYLEPHRINE HCL-NACL 20-0.9 MG/250ML-% IV SOLN
0.0000 ug/min | INTRAVENOUS | Status: DC
  Administered 2019-08-07: 40 ug/min via INTRAVENOUS
  Administered 2019-08-07: 50 ug/min via INTRAVENOUS
  Administered 2019-08-08: 20 ug/min via INTRAVENOUS
  Filled 2019-08-07 (×3): qty 250

## 2019-08-07 MED ORDER — DEXMEDETOMIDINE HCL IN NACL 400 MCG/100ML IV SOLN
0.0000 ug/kg/h | INTRAVENOUS | Status: DC
  Administered 2019-08-07: 0.5 ug/kg/h via INTRAVENOUS
  Administered 2019-08-07: 0.7 ug/kg/h via INTRAVENOUS
  Filled 2019-08-07: qty 100

## 2019-08-07 MED ORDER — MIDODRINE HCL 5 MG PO TABS
10.0000 mg | ORAL_TABLET | Freq: Three times a day (TID) | ORAL | Status: DC
  Administered 2019-08-07 – 2019-08-09 (×8): 10 mg via ORAL
  Filled 2019-08-07 (×9): qty 2

## 2019-08-07 MED ORDER — VANCOMYCIN HCL IN DEXTROSE 1-5 GM/200ML-% IV SOLN
1000.0000 mg | Freq: Once | INTRAVENOUS | Status: AC
  Administered 2019-08-07: 1000 mg via INTRAVENOUS
  Filled 2019-08-07: qty 200

## 2019-08-07 MED ORDER — FUROSEMIDE 10 MG/ML IJ SOLN
20.0000 mg | Freq: Once | INTRAMUSCULAR | Status: AC
  Administered 2019-08-07: 20 mg via INTRAVENOUS
  Filled 2019-08-07: qty 2

## 2019-08-07 MED ORDER — ACETAMINOPHEN 650 MG RE SUPP: 650.0000 mg | Freq: Once | RECTAL | Status: DC

## 2019-08-07 MED ORDER — SODIUM CHLORIDE 0.45 % IV SOLN: INTRAVENOUS | Status: DC | PRN

## 2019-08-07 MED ORDER — ACETAMINOPHEN 160 MG/5ML PO SOLN
1000.0000 mg | Freq: Four times a day (QID) | ORAL | Status: DC
  Administered 2019-08-07 – 2019-08-08 (×5): 1000 mg
  Filled 2019-08-07 (×4): qty 40.6

## 2019-08-07 MED ORDER — ADULT MULTIVITAMIN W/MINERALS CH
1.0000 | ORAL_TABLET | Freq: Every day | ORAL | Status: DC
  Administered 2019-08-08 – 2019-08-17 (×10): 1 via ORAL
  Filled 2019-08-07 (×10): qty 1

## 2019-08-07 MED ORDER — SODIUM CHLORIDE 0.9 % IV SOLN: 250.0000 mL | INTRAVENOUS | Status: DC

## 2019-08-07 MED FILL — Magnesium Sulfate Inj 50%: INTRAMUSCULAR | Qty: 10 | Status: AC

## 2019-08-07 MED FILL — Sodium Bicarbonate IV Soln 8.4%: INTRAVENOUS | Qty: 50 | Status: AC

## 2019-08-07 MED FILL — Mannitol IV Soln 20%: INTRAVENOUS | Qty: 500 | Status: AC

## 2019-08-07 MED FILL — Thrombin (Recombinant) For Soln 20000 Unit: CUTANEOUS | Qty: 1 | Status: AC

## 2019-08-07 MED FILL — Potassium Chloride Inj 2 mEq/ML: INTRAVENOUS | Qty: 40 | Status: AC

## 2019-08-07 MED FILL — Lidocaine HCl Local Soln Prefilled Syringe 100 MG/5ML (2%): INTRAMUSCULAR | Qty: 10 | Status: AC

## 2019-08-07 MED FILL — Heparin Sodium (Porcine) Inj 1000 Unit/ML: INTRAMUSCULAR | Qty: 30 | Status: AC

## 2019-08-07 MED FILL — Sodium Chloride IV Soln 0.9%: INTRAVENOUS | Qty: 7000 | Status: AC

## 2019-08-07 MED FILL — Electrolyte-R (PH 7.4) Solution: INTRAVENOUS | Qty: 4000 | Status: AC

## 2019-08-07 NOTE — Procedures (Signed)
Extubation Procedure Note  Patient Details:   Name: Steve Richards DOB: October 30, 1965 MRN: [id number]   Airway Documentation:    Vent end date: 08/07/19 Vent end time: 1130   Evaluation  O2 sats: stable throughout Complications: No apparent complications Patient did tolerate procedure well. Bilateral Breath Sounds: Diminished   Yes   Pt extubated per rapid wean protocol. Pt suctioned via ETT and orally prior. NIF -25 and VC 1570m. Pt extubated to 2L nasal cannula with humidity. Pt able to speak name, give a good strong cough and no stridor heard at this time. RT will continue to monitor.   MJonathon JordanHuffman 08/07/2019, 11:40 AM

## 2019-08-07 NOTE — Op Note (Signed)
08/07/2019 Steve Richards [id number]  Surgeon: Gaye Pollack, MD   First Assistant: Vernie Murders, RNFA   Preoperative Diagnosis: mediastinal bleeding s/p AVR  Postoperative Diagnosis: Same   Procedure:  1.  Median Sternotomy 2.  Evacuation of mediastinal blood and clots 3.  Ligation of sternal bleeding site.  Anesthesia: General Endotracheal   Clinical History/Surgical Indication:   The patient underwent aortic valve replacement earlier today and has some postoperative coagulopathy.  His chest tube output initially was fairly low as expected but then increased for 2 hours.  States he had some liver dysfunction due to right heart failure I gave him fresh frozen plasma and cryoprecipitate to treat his coagulopathy.  Platelet count was normal.  This resulted in some improvement in the mediastinal bleeding but then it is started increasing again and was 200 cc 1 hour and then 400 cc in next hour.  He remained hemodynamically stable but I felt that he should return to the operating room for exploration.  A TEG profile was performed by anesthesia and was normal.  Preparation:  The patient was taken directly to the operating room.The patient was positioned supine on the operating room table. After being placed under general endotracheal anesthesia by the anesthesia team the neck, chest, and abdomen were prepped with betadine soap and solution and draped in the usual sterile manner. A surgical time-out was taken and the correct patient and operative procedure were confirmed with the nursing and anesthesia staff.   Median Sternotomy:   The previous median sternotomy was opened. Upon separating the sternum there was immediate drainage of a moderate volume of fresh blood and clots.  There is a large amount of blood clot along the right atrium that appeared to be due to blood draining down from the sternum.  Examination of the undersurface of the sternum shows some bleeding from one of the sternal wire  sites and it was felt that this was brisk enough bleeding to cause the amount of drainage that he had from his chest tubes.  All of the blood clot was removed from the mediastinum.  The cannulation sites were all examined and appear complete hemostatic.  The aortotomy suture line was examined and was hemostatic.  The patient wire sites were hemostatic.  After the sternal bleeding site was suture ligated there appeared to be good hemostasis.  The chest tubes were declotted and returned to their original position.  Completion:   The sternum was closed with double #6 stainless steel wires. The fascia was closed with continuous # 1 vicryl suture. The subcutaneous tissue was closed with 2-0 vicryl continuous suture. The skin was closed with 3-0 vicryl subcuticular suture. All sponge, needle, and instrument counts were reported correct at the end of the case. Dry sterile dressings were placed over the incisions and around the chest tubes which were connected to pleurevac suction. The patient was then transported to the surgical intensive care unit in satisfactory and stable condition.

## 2019-08-07 NOTE — Transfer of Care (Signed)
Immediate Anesthesia Transfer of Care Note  Patient: Steve Richards  Procedure(s) Performed: EXPLORATION POST OPERATIVE OPEN HEART (N/A Chest)  Patient Location: SICU  Anesthesia Type:General  Level of Consciousness: Patient remains intubated per anesthesia plan  Airway & Oxygen Therapy: Patient placed on Ventilator (see vital sign flow sheet for setting)  Post-op Assessment: Report given to RN and Post -op Vital signs reviewed and stable  Post vital signs: Reviewed and stable  Last Vitals:  Vitals Value Taken Time  BP    Temp 35.7 C 08/07/19 0022  Pulse 89 08/07/19 0022  Resp 0 08/07/19 0022  SpO2 100 % 08/07/19 0022  Vitals shown include unvalidated device data.  Last Pain:  Vitals:   08/06/19 2000  TempSrc: Core  PainSc: 2          Complications: No apparent anesthesia complications

## 2019-08-07 NOTE — Progress Notes (Signed)
1 Day Post-Op Procedure(s) (LRB): EXPLORATION POST OPERATIVE OPEN HEART (N/A) Subjective: Intubated on vent postop but awakens and follows commands on 0.5 Precedex.  Objective: Vital signs in last 24 hours: Temp:  [96.1 F (35.6 C)-98.1 F (36.7 C)] 96.4 F (35.8 C) (06/04 0700) Pulse Rate:  [87-90] 89 (06/04 0700) Cardiac Rhythm: Atrial paced (06/04 0400) Resp:  [0-21] 13 (06/04 0700) BP: (96-125)/(44-59) 125/59 (06/03 1943) SpO2:  [99 %-100 %] 100 % (06/04 0700) Arterial Line BP: (87-133)/(47-67) 110/54 (06/04 0700) FiO2 (%):  [50 %] 50 % (06/04 0400) Weight:  [123.6 kg] 123.6 kg (06/04 0420)  Hemodynamic parameters for last 24 hours: PAP: (40-59)/(18-23) 43/18 CVP:  [6 mmHg-12 mmHg] 9 mmHg CO:  [3.9 L/min-7.3 L/min] 6.5 L/min CI:  [2.6 L/min/m2-3.1 L/min/m2] 2.7 L/min/m2  Intake/Output from previous day: 06/03 0701 - 06/04 0700 In: 11404.3 [I.V.:6944.2; PJASN:0539; IV Piggyback:1212.1] Out: 8119 [Urine:2520; Blood:2919; Chest Tube:2680] Intake/Output this shift: No intake/output data recorded.  Neurologic: intact Heart: regular rate and rhythm, S1, S2 normal, no murmur Lungs: clear to auscultation bilaterally Extremities: edema moderate Wound: dressing dry  Lab Results: Recent Labs    08/07/19 0020 08/07/19 0435  WBC 13.1* 11.9*  HGB 9.4* 9.3*  HCT 29.0* 27.8*  PLT 122* 101*   BMET:  Recent Labs    08/06/19 2028 08/06/19 2111 08/06/19 2221 08/06/19 2221 08/06/19 2310 08/07/19 0440  NA 137   < > 139   < > 140 137  K 4.0   < > 3.9   < > 4.0 4.0  CL 104   < > 101  --   --  105  CO2 21*  --   --   --   --  24  GLUCOSE 163*   < > 176*  --   --  138*  BUN 14   < > 14  --   --  12  CREATININE 0.94   < > 0.60*  --   --  0.65  CALCIUM 7.5*  --   --   --   --  7.2*   < > = values in this interval not displayed.    PT/INR:  Recent Labs    08/07/19 0435  LABPROT 15.8*  INR 1.3*   ABG    Component Value Date/Time   PHART 7.340 (L) 08/06/2019 2310    HCO3 25.2 08/06/2019 2310   TCO2 27 08/06/2019 2310   ACIDBASEDEF 1.0 08/06/2019 2310   O2SAT 99.0 08/06/2019 2310   CBG (last 3)  Recent Labs    08/07/19 0308 08/07/19 0407 08/07/19 0554  GLUCAP 145* 143* 124*   CXR: ok  ECG: pending  Assessment/Plan:  POD 1 s/p AVR and postop exploration for bleeding.  Hemodynamically stable on milrinone 0.3, epi 3, NE 1, neo 40. CI 8 with CVP 10, normal PAP. He had biventricular failure preop with severe pulmonary HTN. Continue present support pending vent wean.  Chest tube output low. Will keep in today.  Wean vent to extubate. He had PCO2 of 55 on preop ABG probably due to obesity and large bilateral pleural effusions.  Acute postop blood loss anemia: stable.  LOS: 17 days    Gaye Pollack 08/07/2019

## 2019-08-07 NOTE — Anesthesia Postprocedure Evaluation (Signed)
Anesthesia Post Note  Patient: Steve Richards  Procedure(s) Performed: EXPLORATION POST OPERATIVE OPEN HEART (N/A Chest)     Patient location during evaluation: SICU Anesthesia Type: General Level of consciousness: sedated Pain management: pain level controlled Vital Signs Assessment: post-procedure vital signs reviewed and stable Respiratory status: patient remains intubated per anesthesia plan Cardiovascular status: stable Postop Assessment: no apparent nausea or vomiting Anesthetic complications: no    Last Vitals:  Vitals:   08/07/19 0545 08/07/19 0600  BP:    Pulse: 89 89  Resp: 20 11  Temp: (!) 35.8 C (!) 35.8 C  SpO2: 100% 100%    Last Pain:  Vitals:   08/07/19 0400  TempSrc: Core  PainSc: 0-No pain                 Coden Franchi P Ilyssa Grennan

## 2019-08-07 NOTE — Anesthesia Postprocedure Evaluation (Signed)
Anesthesia Post Note  Patient: Steve Richards  Procedure(s) Performed: AORTIC VALVE REPLACEMENT (AVR) using Edwards INSPIRIS Resilia 25 MM Aortic Valve. (N/A Chest) TRANSESOPHAGEAL ECHOCARDIOGRAM (TEE) (N/A )     Patient location during evaluation: PACU Anesthesia Type: General Level of consciousness: sedated and patient remains intubated per anesthesia plan Pain management: pain level controlled Vital Signs Assessment: post-procedure vital signs reviewed and stable Respiratory status: patient remains intubated per anesthesia plan and patient on ventilator - see flowsheet for VS Cardiovascular status: stable Anesthetic complications: no    Last Vitals:  Vitals:   08/07/19 1800 08/07/19 1900  BP:    Pulse: 89 89  Resp: (!) 22 (!) 21  Temp: 36.5 C 36.6 C  SpO2: 99% 96%    Last Pain:  Vitals:   08/07/19 1600  TempSrc:   PainSc: 0-No pain                 Nolon Nations

## 2019-08-07 NOTE — Progress Notes (Signed)
[facility] DAILY ICU PROGRESS NOTE                   [address]            [place] [address]          [phone]   1 Day Post-Op Procedure(s) (LRB): EXPLORATION POST OPERATIVE OPEN HEART (N/A)  Total Length of Stay:  LOS: 17 days   Subjective: Events of last evening noted. Patient remains on vent. He does respond when spoke to.  Objective: Vital signs in last 24 hours: Temp:  [96.1 F (35.6 C)-98.1 F (36.7 C)] 96.4 F (35.8 C) (06/04 0700) Pulse Rate:  [87-90] 89 (06/04 0700) Cardiac Rhythm: Atrial paced (06/04 0400) Resp:  [0-21] 13 (06/04 0700) BP: (96-125)/(44-59) 125/59 (06/03 1943) SpO2:  [99 %-100 %] 100 % (06/04 0700) Arterial Line BP: (87-133)/(47-67) 110/54 (06/04 0700) FiO2 (%):  [50 %] 50 % (06/04 0400) Weight:  [123.6 kg] 123.6 kg (06/04 0420)  Filed Weights   08/05/19 0437 08/06/19 0416 08/07/19 0420  Weight: 119.6 kg 116.5 kg 123.6 kg    Weight change: 7.1 kg   Hemodynamic parameters for last 24 hours: PAP: (40-59)/(18-23) 43/18 CVP:  [6 mmHg-12 mmHg] 9 mmHg CO:  [3.9 L/min-7.3 L/min] 6.5 L/min CI:  [2.6 L/min/m2-3.1 L/min/m2] 2.7 L/min/m2  Intake/Output from previous day: 06/03 0701 - 06/04 0700 In: 11404.3 [I.V.:6944.2; IHKVQ:2595; IV Piggyback:1212.1] Out: 8119 [Urine:2520; Blood:2919; Chest Tube:2680]  Intake/Output this shift: No intake/output data recorded.  Current Meds: Scheduled Meds: . acetaminophen  1,000 mg Oral Q6H   Or  . acetaminophen (TYLENOL) oral liquid 160 mg/5 mL  1,000 mg Per Tube Q6H  . acetaminophen (TYLENOL) oral liquid 160 mg/5 mL  650 mg Per Tube Once   Or  . acetaminophen  650 mg Rectal Once  . aspirin EC  325 mg Oral Daily   Or  . aspirin  324 mg Per Tube Daily  . bisacodyl  10 mg Oral Daily   Or  . bisacodyl  10 mg Rectal Daily  . chlorhexidine  15 mL Mouth/Throat NOW  . Chlorhexidine Gluconate Cloth  6 each Topical Daily  . docusate sodium  200 mg Oral Daily  . feeding supplement (ENSURE  ENLIVE)  237 mL Oral Q1500  . feeding supplement (PRO-STAT SUGAR FREE 64)  30 mL Oral Daily  . levothyroxine  25 mcg Oral Q0600  . mouth rinse  15 mL Mouth Rinse 10 times per day  . metoCLOPramide (REGLAN) injection  10 mg Intravenous Q6H  . [START ON 08/08/2019] pantoprazole  40 mg Oral Daily  . sodium chloride flush  3 mL Intravenous Q12H   Continuous Infusions: . sodium chloride    . sodium chloride    . sodium chloride 500 mL (08/07/19 0109)  . albumin human    . cefUROXime (ZINACEF)  IV Stopped (08/07/19 0333)  . dexmedetomidine (PRECEDEX) IV infusion 0.7 mcg/kg/hr (08/07/19 0700)  . EPINEPHrine 4 mg in dextrose 5% 250 mL infusion (16 mcg/mL)    . epinephrine 3 mcg/min (08/07/19 0700)  . famotidine (PEPCID) IV Stopped (08/07/19 0137)  . insulin 2.6 mL/hr at 08/07/19 0700  . lactated ringers 20 mL/hr at 08/07/19 0700  . lactated ringers    . milrinone 0.3 mcg/kg/min (08/07/19 0700)  . nitroGLYCERIN    . norepinephrine (LEVOPHED) Adult infusion 1 mcg/min (08/07/19 0700)  . phenylephrine (NEO-SYNEPHRINE) Adult infusion 40 mcg/min (08/07/19 0700)  . vancomycin     PRN  Meds:.sodium chloride, albumin human, dextrose, midazolam, morphine injection, ondansetron (ZOFRAN) IV, oxyCODONE, sodium chloride flush, traMADol  General appearance: no distress Heart: Paced Lungs: Diminished bibasilar breath sounds Abdomen: Soft, sporadic bowel sounds Extremities: Both LEs wrapped Wound: Aquacel intact Neurologic: Awakens and nods head. Trying to move hands/arms (in protective mittens)  Lab Results: CBC: Recent Labs    08/07/19 0020 08/07/19 0435  WBC 13.1* 11.9*  HGB 9.4* 9.3*  HCT 29.0* 27.8*  PLT 122* 101*   BMET:  Recent Labs    08/06/19 2028 08/06/19 2111 08/06/19 2221 08/06/19 2221 08/06/19 2310 08/07/19 0440  NA 137   < > 139   < > 140 137  K 4.0   < > 3.9   < > 4.0 4.0  CL 104   < > 101  --   --  105  CO2 21*  --   --   --   --  24  GLUCOSE 163*   < > 176*  --   --   138*  BUN 14   < > 14  --   --  12  CREATININE 0.94   < > 0.60*  --   --  0.65  CALCIUM 7.5*  --   --   --   --  7.2*   < > = values in this interval not displayed.    CMET: Lab Results  Component Value Date   WBC 11.9 (H) 08/07/2019   HGB 9.3 (L) 08/07/2019   HCT 27.8 (L) 08/07/2019   PLT 101 (L) 08/07/2019   GLUCOSE 138 (H) 08/07/2019   CHOL 128 07/21/2019   TRIG 87 07/21/2019   HDL 37 (L) 07/21/2019   LDLCALC 74 07/21/2019   ALT 20 08/06/2019   AST 28 08/06/2019   NA 137 08/07/2019   K 4.0 08/07/2019   CL 105 08/07/2019   CREATININE 0.65 08/07/2019   BUN 12 08/07/2019   CO2 24 08/07/2019   TSH 14.146 (H) 07/21/2019   INR 1.3 (H) 08/07/2019   HGBA1C 6.1 (H) 08/06/2019      PT/INR:  Recent Labs    08/07/19 0435  LABPROT 15.8*  INR 1.3*   Radiology: [facility] Chest Port 1 View  Result Date: 08/07/2019 CLINICAL DATA:  Status post coronary bypass grafting and AVR EXAM: PORTABLE CHEST 1 VIEW COMPARISON:  08/06/2018 FINDINGS: Stable postoperative changes are noted. Endotracheal tube, Swan-Ganz catheter, bilateral thoracostomy tubes and mediastinal drain are seen. Pericardial drain is noted as well. Gastric catheter shows the tip in the stomach although the proximal side port is in the distal esophagus. This should be advanced several cm. The lungs are well aerated without evidence of pneumothorax or focal infiltrate. Very mild central vascular congestion is noted. IMPRESSION: Mild central vascular congestion. Tubes and lines as described above. The gastric catheter could be advanced several cm deeper into the stomach. Electronically Signed   By: Inez Catalina M.D.   On: 08/07/2019 00:31   DG Chest Port 1 View  Result Date: 08/06/2019 CLINICAL DATA:  Shortness of breath. EXAM: PORTABLE CHEST 1 VIEW COMPARISON:  August 06, 2019. FINDINGS: Stable cardiomegaly. Endotracheal tube is in grossly good position. Distal tip of nasogastric tube is seen in proximal stomach. Bilateral chest tubes are  noted without pneumothorax. Right internal jugular Swan-Ganz catheter is noted with tip directed into right pulmonary artery. No pleural effusion is noted. Bony thorax is unremarkable. IMPRESSION: Stable support apparatus. No pneumothorax is noted. Bilateral chest tubes are noted without pneumothorax.  No acute cardiopulmonary abnormality seen. Electronically Signed   By: Marijo Conception M.D.   On: 08/06/2019 14:45    Assessment/Plan: S/P Procedure(s) (LRB): EXPLORATION POST OPERATIVE OPEN HEART (N/A) 1. CV-SR with HR in the 80-90's. CO/CI 6.5/2.7. INR 1.3. On epinephrine 3 mcg/min, Milrinone 0.3 mcg/kg/min, Neo Syneprhine 40 mcg/min. Will slowly wean drips. 2. Pulmonary-on vent. Chest tubes with 2680. . CXR this am shows mild vascular congestion, no pneumothorax. Chest tubes to remain. Hope to extubate later 3. Volume overload-will begin diuresis once off pressors 4. Expected ABL anemia-H and H this am 9.3 and 27.8 5. CBGs 145/143/124. Pre op HGA1C 6.1 6. Thrombocytopenia-platelets this am 101,000 7. Hypothyroidism-on Levothyroxine 25 mcg daily  Leemon Ayala Liston Alba PA-C 08/07/2019 7:27 AM

## 2019-08-07 NOTE — Progress Notes (Signed)
Nutrition Follow-up  DOCUMENTATION CODES:   Morbid obesity  INTERVENTION:   Resume PO supplements:   Ensure Enlive PO BID, each supplement provides 350 kcal and 20 grams of protein.  Prostat 30 ml poonce daily, each supplement provides 100 kcal and 15 grams of protein.  MVI with minerals daily.  NUTRITION DIAGNOSIS:   Increased nutrient needs related to chronic illness(cirrhosis) as evidenced by estimated needs; ongoing  GOAL:   Patient will meet greater than or equal to 90% of their needs; unmet  MONITOR:   PO intake, Supplement acceptance, Skin, Weight trends, Labs, I & O's  REASON FOR ASSESSMENT:   Consult Assessment of nutrition requirement/status  ASSESSMENT:   54 y.o. male with medical history significant of R pleural effusion in April 2021, cirrhosis with ascites s/p thoracentesis presenting with SOB and LE edema. COVID positive. Pt with acute systolic/diastolic CHF.   5/25 S/P left heart cath. 6/03 S/P TEE and aortic valve replacement. Required re-exploration for post-op bleeding and ligation of sternal bleeding site. Remained intubated over night. Patient was extubated today.   Labs reviewed.  CBG: [id number]  Medications reviewed and include colace, lasix, novolog, levemir, reglan, levophed, neosynephrine, epinephrine, insulin drip.  Current weight 123.6 kg Admit weight 128.5 kg I/O +3.2 L 6/3  Diet Order:   Diet Order            Diet Carb Modified Fluid consistency: Thin; Room service appropriate? Yes  Diet effective now              EDUCATION NEEDS:   Not appropriate for education at this time  Skin:  Skin Assessment: Skin Integrity Issues: Skin Integrity Issues:: Incisions Incisions: chest Other: Left lower extremity cellulitis  Last BM:  6/2 type 6  Height:   Ht Readings from Last 1 Encounters:  08/06/19 6' (1.829 m)    Weight:   Wt Readings from Last 1 Encounters:  08/07/19 123.6 kg    BMI:  Body mass index is 36.96  kg/m.  Estimated Nutritional Needs:   Kcal:  2000-2200  Protein:  120-130 grams  Fluid:  1.5 L/day   Lucas Mallow, RD, LDN, CNSC Please refer to Amion for contact information.

## 2019-08-08 ENCOUNTER — Inpatient Hospital Stay (HOSPITAL_COMMUNITY): Payer: 59

## 2019-08-08 ENCOUNTER — Inpatient Hospital Stay: Payer: Self-pay

## 2019-08-08 LAB — BASIC METABOLIC PANEL
Anion gap: 6 (ref 5–15)
BUN: 9 mg/dL (ref 6–20)
CO2: 26 mmol/L (ref 22–32)
Calcium: 7.5 mg/dL — ABNORMAL LOW (ref 8.9–10.3)
Chloride: 103 mmol/L (ref 98–111)
Creatinine, Ser: 0.64 mg/dL (ref 0.61–1.24)
GFR calc Af Amer: >60 mL/min (ref >60)
GFR calc non Af Amer: >60 mL/min (ref >60)
Glucose, Bld: 162 mg/dL — ABNORMAL HIGH (ref 70–99)
Potassium: 4 mmol/L (ref 3.5–5.1)
Sodium: 135 mmol/L (ref 135–145)

## 2019-08-08 LAB — GLUCOSE, CAPILLARY
Glucose-Capillary: 142 mg/dL — ABNORMAL HIGH (ref 70–99)
Glucose-Capillary: 148 mg/dL — ABNORMAL HIGH (ref 70–99)
Glucose-Capillary: 157 mg/dL — ABNORMAL HIGH (ref 70–99)
Glucose-Capillary: 186 mg/dL — ABNORMAL HIGH (ref 70–99)
Glucose-Capillary: 96 mg/dL (ref 70–99)

## 2019-08-08 LAB — CBC
HCT: 25.8 % — ABNORMAL LOW (ref 39.0–52.0)
Hemoglobin: 8.2 g/dL — ABNORMAL LOW (ref 13.0–17.0)
MCH: 30.1 pg (ref 26.0–34.0)
MCHC: 31.8 g/dL (ref 30.0–36.0)
MCV: 94.9 fL (ref 80.0–100.0)
Platelets: 111 10*3/uL — ABNORMAL LOW (ref 150–400)
RBC: 2.72 MIL/uL — ABNORMAL LOW (ref 4.22–5.81)
RDW: 18.2 % — ABNORMAL HIGH (ref 11.5–15.5)
WBC: 14.9 10*3/uL — ABNORMAL HIGH (ref 4.0–10.5)
nRBC: 0.3 % — ABNORMAL HIGH (ref 0.0–0.2)

## 2019-08-08 LAB — COOXEMETRY PANEL
Carboxyhemoglobin: 2.3 % — ABNORMAL HIGH (ref 0.5–1.5)
Methemoglobin: 1.3 % (ref 0.0–1.5)
O2 Saturation: 73.6 %
Total hemoglobin: 8.6 g/dL — ABNORMAL LOW (ref 12.0–16.0)

## 2019-08-08 MED ORDER — CHLORHEXIDINE GLUCONATE 0.12 % MT SOLN
15.0000 mL | Freq: Two times a day (BID) | OROMUCOSAL | Status: AC
  Administered 2019-08-08 – 2019-08-11 (×4): 15 mL via OROMUCOSAL
  Filled 2019-08-08 (×5): qty 15

## 2019-08-08 MED ORDER — FE FUMARATE-B12-VIT C-FA-IFC PO CAPS
1.0000 | ORAL_CAPSULE | Freq: Three times a day (TID) | ORAL | Status: DC
  Administered 2019-08-08 – 2019-08-17 (×28): 1 via ORAL
  Filled 2019-08-08 (×28): qty 1

## 2019-08-08 MED ORDER — FUROSEMIDE 10 MG/ML IJ SOLN
40.0000 mg | Freq: Two times a day (BID) | INTRAMUSCULAR | Status: AC
  Administered 2019-08-08 – 2019-08-09 (×2): 40 mg via INTRAVENOUS
  Filled 2019-08-08 (×2): qty 4

## 2019-08-08 MED ORDER — ENOXAPARIN SODIUM 40 MG/0.4ML ~~LOC~~ SOLN
40.0000 mg | SUBCUTANEOUS | Status: DC
  Administered 2019-08-08 – 2019-08-16 (×9): 40 mg via SUBCUTANEOUS
  Filled 2019-08-08 (×10): qty 0.4

## 2019-08-08 MED ORDER — INSULIN ASPART 100 UNIT/ML ~~LOC~~ SOLN
0.0000 [IU] | Freq: Three times a day (TID) | SUBCUTANEOUS | Status: DC
  Administered 2019-08-09: 4 [IU] via SUBCUTANEOUS
  Administered 2019-08-10 (×2): 2 [IU] via SUBCUTANEOUS

## 2019-08-08 MED ORDER — MORPHINE SULFATE (PF) 2 MG/ML IV SOLN: 1.0000 mg | INTRAVENOUS | Status: DC | PRN

## 2019-08-08 NOTE — Progress Notes (Signed)
2 Days Post-Op Procedure(s) (LRB): EXPLORATION POST OPERATIVE OPEN HEART (N/A) Subjective: Feels well, weaning drips,nsr Up in chair Objective: Vital signs in last 24 hours: Temp:  [97.3 F (36.3 C)-98.2 F (36.8 C)] 97.5 F (36.4 C) (06/05 1200) Pulse Rate:  [86-96] 86 (06/05 1200) Cardiac Rhythm: Normal sinus rhythm (06/05 1005) Resp:  [0-25] 21 (06/05 1200) BP: (117-140)/(58-76) 125/71 (06/05 1200) SpO2:  [94 %-100 %] 100 % (06/05 1200) Arterial Line BP: (94-159)/(32-61) 138/58 (06/05 1200) Weight:  [121.9 kg] 121.9 kg (06/05 0500)  Hemodynamic parameters for last 24 hours: PAP: (37-58)/(7-21) 46/14 CVP:  [3 mmHg-13 mmHg] 4 mmHg CO:  [7 L/min-9.2 L/min] 7 L/min CI:  [2.9 L/min/m2-3.9 L/min/m2] 2.9 L/min/m2  Intake/Output from previous day: 06/04 0701 - 06/05 0700 In: 2696.4 [P.O.:720; I.V.:1597.4; IV Piggyback:379] Out: 2863 [Urine:3800; Chest Tube:990] Intake/Output this shift: Total I/O In: 821.5 [P.O.:480; I.V.:320.6; IV Piggyback:21] Out: 920 [Urine:800; Chest Tube:120]       Exam    General- alert and comfortable    Neck- no JVD, no cervical adenopathy palpable, no carotid bruit   Lungs- clear without rales, wheezes   Cor- regular rate and rhythm, no murmur , gallop   Abdomen- soft, non-tender   Extremities - warm, non-tender, minimal edema   Neuro- oriented, appropriate, no focal weakness   Lab Results: Recent Labs    08/07/19 2046 08/08/19 0410  WBC 16.3* 14.9*  HGB 8.4* 8.2*  HCT 25.9* 25.8*  PLT 118* 111*   BMET:  Recent Labs    08/07/19 0440 08/07/19 0440 08/07/19 1103 08/08/19 0410  NA 137   < > 142 135  K 4.0   < > 3.8 4.0  CL 105  --   --  103  CO2 24  --   --  26  GLUCOSE 138*  --   --  162*  BUN 12  --   --  9  CREATININE 0.65  --   --  0.64  CALCIUM 7.2*  --   --  7.5*   < > = values in this interval not displayed.    PT/INR:  Recent Labs    08/07/19 0435  LABPROT 15.8*  INR 1.3*   ABG    Component Value Date/Time    PHART 7.441 08/07/2019 1103   HCO3 26.5 08/07/2019 1103   TCO2 28 08/07/2019 1103   ACIDBASEDEF 1.0 08/06/2019 2310   O2SAT 73.6 08/08/2019 0410   CBG (last 3)  Recent Labs    08/08/19 0410 08/08/19 0700 08/08/19 1130  GLUCAP 157* 148* 142*    Assessment/Plan: S/P Procedure(s) (LRB): EXPLORATION POST OPERATIVE OPEN HEART (N/A) Mobilize Diuresis Diabetes control d/c tubes/lines leave on milrinone  Cont bid iv lasix   LOS: 18 days    Steve Richards 08/08/2019

## 2019-08-08 NOTE — Progress Notes (Signed)
CT surgery p.m. Rounds  The patient had stable day and walked in hallway Contain sinus rhythm Diuresing well  Blood pressure 128/60, pulse 82, temperature 97.9 F (36.6 C), resp. rate 19, height 6' (1.829 m), weight 121.9 kg, SpO2 100 %.

## 2019-08-08 NOTE — Progress Notes (Signed)
Spoke with Levada Dy RN re PICC order, to be placed 08/09/19.

## 2019-08-09 ENCOUNTER — Inpatient Hospital Stay (HOSPITAL_COMMUNITY): Payer: 59

## 2019-08-09 LAB — BASIC METABOLIC PANEL
Anion gap: 9 (ref 5–15)
BUN: 11 mg/dL (ref 6–20)
CO2: 29 mmol/L (ref 22–32)
Calcium: 7.7 mg/dL — ABNORMAL LOW (ref 8.9–10.3)
Chloride: 100 mmol/L (ref 98–111)
Creatinine, Ser: 0.63 mg/dL (ref 0.61–1.24)
GFR calc Af Amer: >60 mL/min (ref >60)
GFR calc non Af Amer: >60 mL/min (ref >60)
Glucose, Bld: 90 mg/dL (ref 70–99)
Potassium: 3.5 mmol/L (ref 3.5–5.1)
Sodium: 138 mmol/L (ref 135–145)

## 2019-08-09 LAB — BPAM RBC
Blood Product Expiration Date: 202106242359
Blood Product Expiration Date: 202106252359
Blood Product Expiration Date: 202106252359
Blood Product Expiration Date: 202106282359
ISSUE DATE / TIME: 202106032217
ISSUE DATE / TIME: 202106032217
ISSUE DATE / TIME: 202106032217
ISSUE DATE / TIME: 202106032217
Unit Type and Rh: 6200
Unit Type and Rh: 6200
Unit Type and Rh: 6200
Unit Type and Rh: 6200

## 2019-08-09 LAB — TYPE AND SCREEN
ABO/RH(D): A POS
Antibody Screen: NEGATIVE
Unit division: 0
Unit division: 0
Unit division: 0
Unit division: 0

## 2019-08-09 LAB — COOXEMETRY PANEL
Carboxyhemoglobin: 2.1 % — ABNORMAL HIGH (ref 0.5–1.5)
Methemoglobin: 1.1 % (ref 0.0–1.5)
O2 Saturation: 71.1 %
Total hemoglobin: 9.8 g/dL — ABNORMAL LOW (ref 12.0–16.0)

## 2019-08-09 LAB — CBC
HCT: 24.2 % — ABNORMAL LOW (ref 39.0–52.0)
Hemoglobin: 7.5 g/dL — ABNORMAL LOW (ref 13.0–17.0)
MCH: 29.9 pg (ref 26.0–34.0)
MCHC: 31 g/dL (ref 30.0–36.0)
MCV: 96.4 fL (ref 80.0–100.0)
Platelets: 84 10*3/uL — ABNORMAL LOW (ref 150–400)
RBC: 2.51 MIL/uL — ABNORMAL LOW (ref 4.22–5.81)
RDW: 18.1 % — ABNORMAL HIGH (ref 11.5–15.5)
WBC: 8.8 10*3/uL (ref 4.0–10.5)
nRBC: 0.3 % — ABNORMAL HIGH (ref 0.0–0.2)

## 2019-08-09 LAB — GLUCOSE, CAPILLARY
Glucose-Capillary: 80 mg/dL (ref 70–99)
Glucose-Capillary: 75 mg/dL (ref 70–99)
Glucose-Capillary: 113 mg/dL — ABNORMAL HIGH (ref 70–99)
Glucose-Capillary: 124 mg/dL — ABNORMAL HIGH (ref 70–99)
Glucose-Capillary: 174 mg/dL — ABNORMAL HIGH (ref 70–99)

## 2019-08-09 LAB — HEMOGLOBIN AND HEMATOCRIT, BLOOD
HCT: 27.3 % — ABNORMAL LOW (ref 39.0–52.0)
Hemoglobin: 8.5 g/dL — ABNORMAL LOW (ref 13.0–17.0)

## 2019-08-09 LAB — PREPARE RBC (CROSSMATCH)

## 2019-08-09 MED ORDER — MILRINONE LACTATE IN DEXTROSE 20-5 MG/100ML-% IV SOLN
0.1250 ug/kg/min | INTRAVENOUS | Status: DC
  Administered 2019-08-09: 0.125 ug/kg/min via INTRAVENOUS
  Filled 2019-08-09: qty 100

## 2019-08-09 MED ORDER — POTASSIUM CHLORIDE CRYS ER 20 MEQ PO TBCR
40.0000 meq | EXTENDED_RELEASE_TABLET | Freq: Two times a day (BID) | ORAL | Status: DC
  Administered 2019-08-09 – 2019-08-10 (×2): 40 meq via ORAL
  Filled 2019-08-09 (×2): qty 2

## 2019-08-09 MED ORDER — SODIUM CHLORIDE 0.9% FLUSH
10.0000 mL | Freq: Two times a day (BID) | INTRAVENOUS | Status: DC
  Administered 2019-08-09 – 2019-08-14 (×11): 10 mL
  Administered 2019-08-15 – 2019-08-16 (×2): 20 mL
  Administered 2019-08-17: 10 mL

## 2019-08-09 MED ORDER — SODIUM CHLORIDE 0.9% FLUSH
10.0000 mL | INTRAVENOUS | Status: DC | PRN
  Administered 2019-08-13: 10 mL

## 2019-08-09 MED ORDER — SODIUM CHLORIDE 0.9% IV SOLUTION: Freq: Once | INTRAVENOUS | Status: AC

## 2019-08-09 MED ORDER — POTASSIUM CHLORIDE CRYS ER 20 MEQ PO TBCR
20.0000 meq | EXTENDED_RELEASE_TABLET | ORAL | Status: AC
  Administered 2019-08-09 (×3): 20 meq via ORAL
  Filled 2019-08-09 (×3): qty 1

## 2019-08-09 NOTE — Progress Notes (Signed)
Hemoglobin 7.5 this AM. MD Prescott Gum paged and ordered 1 unit RBC.

## 2019-08-09 NOTE — Evaluation (Addendum)
Physical Therapy Re-Evaluation Patient Details Name: Steve Richards MRN: [id number] DOB: 09-08-1965 Today's Date: 08/09/2019   History of Present Illness  54y.o. male admitted on 07/22/19 for SOB and LE edema.  Dx with acute systolic/diastolic CHF, volume overload, COVID (+), LE dopplers pending to r/o DVT due to elevated D dimer, liver cirrhosis, L LE cellulits .  Pt with significant PMH of  Pleural effusion, cirrhosis of the liver with ascites d/p thoracentesis.  Caridology consulted and also found severe aortic stenosis.  Bil LE dopplers negative for DVT.  Due to have open valve replacement the week of 5/29.  Patient s/p open AVR/TEE 08/06/19.  Clinical Impression  Patient presents now s/p AVR and has decreased activity tolerance, decreased balance, decreased LE strength and decreased knowledge of precautions and he will benefit from skilled PT in the acute setting and from follow up CIR level rehab at d/c.  Pt. Lives alone and was independent and working prior to admission.  Currently max A for transfers, mod A for bed mobility and min a for ambulation 130'.     Follow Up Recommendations CIR    Equipment Recommendations  Rolling walker with 5" wheels    Recommendations for Other Services Rehab consult     Precautions / Restrictions Precautions Precautions: Fall;Sternal Restrictions Other Position/Activity Restrictions: unna boots      Mobility  Bed Mobility Overal bed mobility: Needs Assistance Bed Mobility: Sit to Sidelying         Sit to sidelying: Mod assist General bed mobility comments: cues for precautions, assist for legs into bed and repositioning trunk  Transfers Overall transfer level: Needs assistance Equipment used: 4-wheeled walker(eva walker) Transfers: Sit to/from Stand Sit to Stand: Max assist         General transfer comment: heavy lifting help from chair/recliner; four attempts with +1 A for success.  Ambulation/Gait Ambulation/Gait assistance: Min  guard Gait Distance (Feet): 130 Feet Assistive device: 4-wheeled walker(eva walker) Gait Pattern/deviations: Step-through pattern;Decreased stride length     General Gait Details: on O2 2LPM for ambulation as recently weaned to RA at rest  Stairs            Wheelchair Mobility    Modified Rankin (Stroke Patients Only)       Balance Overall balance assessment: Needs assistance   Sitting balance-Leahy Scale: Fair       Standing balance-Leahy Scale: Fair Standing balance comment: once up to stand could stand without UE support, but needed max A to stand                             Pertinent Vitals/Pain Faces Pain Scale: Hurts little more Pain Location: chest with movement Pain Descriptors / Indicators: Sore;Operative site guarding Pain Intervention(s): Monitored during session;Repositioned    Home Living Family/patient expects to be discharged to:: Private residence Living Arrangements: Alone Available Help at Discharge: Friend(s);Available PRN/intermittently Type of Home: House Home Access: Stairs to enter Entrance Stairs-Rails: Chemical engineer of Steps: 3 in front no rails, 7 in back with bilateral rails Home Layout: One level Home Equipment: None      Prior Function Level of Independence: Independent         Comments: pt works as a Geophysicist/field seismologist.      Journalist, newspaper        Extremity/Trunk Assessment   Upper Extremity Assessment Upper Extremity Assessment: RUE deficits/detail;LUE deficits/detail RUE Deficits / Details: puffy hands limited assessment due to  sternal precautions RUE Sensation: decreased light touch(fingertips) LUE Deficits / Details: puffy hands limited assessment due to sternal precautions LUE Sensation: decreased light touch(fingertips)    Lower Extremity Assessment Lower Extremity Assessment: RLE deficits/detail;LLE deficits/detail RLE Deficits / Details: ace wraps/unna boots on, hip flexion  3/5, knee extension 4/5 RLE Sensation: history of peripheral neuropathy;decreased light touch LLE Deficits / Details: ace wraps/unna boots on, hip flexion 3/5, knee extension 4/5 LLE Sensation: history of peripheral neuropathy;decreased light touch       Communication   Communication: No difficulties  Cognition Arousal/Alertness: Awake/alert Behavior During Therapy: WFL for tasks assessed/performed Overall Cognitive Status: Within Functional Limits for tasks assessed                                        General Comments General comments (skin integrity, edema, etc.): discussed possible CIR prior to home    Exercises     Assessment/Plan    PT Assessment Patient needs continued PT services  PT Problem List Decreased mobility;Decreased strength;Decreased activity tolerance;Decreased balance;Decreased knowledge of use of DME;Decreased safety awareness;Decreased knowledge of precautions       PT Treatment Interventions DME instruction;Therapeutic activities;Gait training;Therapeutic exercise;Patient/family education;Balance training;Functional mobility training;Stair training    PT Goals (Current goals can be found in the Care Plan section)  Acute Rehab PT Goals Patient Stated Goal: to get strong and return to independent PT Goal Formulation: With patient Time For Goal Achievement: 08/23/19 Potential to Achieve Goals: Good    Frequency Min 3X/week   Barriers to discharge        Co-evaluation               AM-PAC PT "6 Clicks" Mobility  Outcome Measure Help needed turning from your back to your side while in a flat bed without using bedrails?: A Little Help needed moving from lying on your back to sitting on the side of a flat bed without using bedrails?: A Lot Help needed moving to and from a bed to a chair (including a wheelchair)?: A Lot Help needed standing up from a chair using your arms (e.g., wheelchair or bedside chair)?: Total Help needed  to walk in hospital room?: A Little Help needed climbing 3-5 steps with a railing? : A Lot 6 Click Score: 13    End of Session Equipment Utilized During Treatment: Oxygen Activity Tolerance: Patient tolerated treatment well Patient left: in bed;with call bell/phone within reach   PT Visit Diagnosis: Other abnormalities of gait and mobility (R26.89);Muscle weakness (generalized) (M62.81)    Time: 7371-0626 PT Time Calculation (min) (ACUTE ONLY): 23 min   Charges:   PT Evaluation $PT Re-evaluation: 1 Re-eval PT Treatments $Gait Training: 8-22 mins        Steve Richards, PT Acute Rehabilitation Services [phone] 08/09/2019   Steve Richards 08/09/2019, 12:16 PM

## 2019-08-09 NOTE — Progress Notes (Signed)
CT surgery p.m. Rounds  Blood pressure (!) 148/72, pulse 84, temperature 98.8 F (37.1 C), resp. rate (!) 23, height 6' (1.829 m), weight 120.2 kg, SpO2 100 %.  Stable day with good diuresis Pleural tubes continue to drain serosanguineous fluid Ambulating in hallway Milrinone o reduced 0.125 and coox for a.m.

## 2019-08-09 NOTE — Progress Notes (Signed)
  Peripherally Inserted Central Catheter Placement  The IV Nurse has discussed with the patient and/or persons authorized to consent for the patient, the purpose of this procedure and the potential benefits and risks involved with this procedure.  The benefits include less needle sticks, lab draws from the catheter, and the patient may be discharged home with the catheter. Risks include, but not limited to, infection, bleeding, blood clot (thrombus formation), and puncture of an artery; nerve damage and irregular heartbeat and possibility to perform a PICC exchange if needed/ordered by physician.  Alternatives to this procedure were also discussed.  Bard Power PICC patient education guide, fact sheet on infection prevention and patient information card has been provided to patient /or left at bedside.    PICC Placement Documentation  PICC Double Lumen 08/09/19 PICC Right Brachial 40 cm 0 cm (Active)  Indication for Insertion or Continuance of Line Prolonged intravenous therapies 08/09/19 1100  Exposed Catheter (cm) 0 cm 08/09/19 1100  Site Assessment Clean;Dry;Intact 08/09/19 1100  Lumen #1 Status Flushed;Blood return noted;Saline locked 08/09/19 1100  Lumen #2 Status Flushed;Blood return noted;Saline locked 08/09/19 1100  Dressing Type Transparent 08/09/19 1100  Dressing Status Clean;Dry;Intact;Antimicrobial disc in place 08/09/19 1100  Safety Lock Not Applicable 29/52/84 1324  Line Care Connections checked and tightened 08/09/19 1100  Dressing Intervention New dressing 08/09/19 1100  Dressing Change Due 08/16/19 08/09/19 Lacey, Keenan Bachelor 08/09/2019, 11:39 AM

## 2019-08-09 NOTE — Progress Notes (Signed)
3 Days Post-Op Procedure(s) (LRB): EXPLORATION POST OPERATIVE OPEN HEART (N/A) Subjective: Patientt making progress after AVR Walking in hallway diuresing with Lasix Pleural chest tubes still with thin output we will leave in place Slowly weaning milrinone and following mixed venous saturation PICC line placed for IV access so Can be removed  Objective: Vital signs in last 24 hours: Temp:  [97.5 F (36.4 C)-98.5 F (36.9 C)] 98.4 F (36.9 C) (06/06 0826) Pulse Rate:  [71-93] 82 (06/06 0826) Cardiac Rhythm: Normal sinus rhythm (06/06 0800) Resp:  [14-23] 17 (06/06 0826) BP: (117-151)/(54-77) 135/67 (06/06 0826) SpO2:  [90 %-100 %] 92 % (06/06 0826) Arterial Line BP: (125-138)/(53-58) 125/53 (06/05 1300) Weight:  [120.2 kg] 120.2 kg (06/06 0500)  Hemodynamic parameters for last 24 hours: PAP: (46-52)/(14-15) 52/15 CVP:  [4 mmHg-8 mmHg] 8 mmHg  Intake/Output from previous day: 06/05 0701 - 06/06 0700 In: 2109.8 [P.O.:1320; I.V.:668.8; IV Piggyback:121] Out: 3810 [Urine:3625; Chest Tube:410] Intake/Output this shift: Total I/O In: 340 [I.V.:14; Blood:326] Out: 200 [Urine:150; Chest Tube:50]       Exam    General- alert and comfortable    Neck- no JVD, no cervical adenopathy palpable, no carotid bruit   Lungs- clear without rales, wheezes   Cor- regular rate and rhythm, no murmur , gallop   Abdomen- soft, non-tender   Extremities - warm, non-tender, minimal edema   Neuro- oriented, appropriate, no focal weakness    Lab Results: Recent Labs    08/08/19 0410 08/08/19 0410 08/09/19 0336 08/09/19 1017  WBC 14.9*  --  8.8  --   HGB 8.2*   < > 7.5* 8.5*  HCT 25.8*   < > 24.2* 27.3*  PLT 111*  --  84*  --    < > = values in this interval not displayed.   BMET:  Recent Labs    08/08/19 0410 08/09/19 0336  NA 135 138  K 4.0 3.5  CL 103 100  CO2 26 29  GLUCOSE 162* 90  BUN 9 11  CREATININE 0.64 0.63  CALCIUM 7.5* 7.7*    PT/INR:  Recent Labs     08/07/19 0435  LABPROT 15.8*  INR 1.3*   ABG    Component Value Date/Time   PHART 7.441 08/07/2019 1103   HCO3 26.5 08/07/2019 1103   TCO2 28 08/07/2019 1103   ACIDBASEDEF 1.0 08/06/2019 2310   O2SAT 71.1 08/09/2019 0336   CBG (last 3)  Recent Labs    08/08/19 2313 08/09/19 0336 08/09/19 0647  GLUCAP 96 80 75    Assessment/Plan: S/P Procedure(s) (LRB): EXPLORATION POST OPERATIVE OPEN HEART (N/A) Postop expected blood loss anemia, continue oral iron Continue with diuresis, wean milrinone 0.125 Leave chest tubes in place for persistent serosanguineous drainage   LOS: 19 days    Tharon Aquas Trigt III 08/09/2019

## 2019-08-10 ENCOUNTER — Inpatient Hospital Stay (HOSPITAL_COMMUNITY): Payer: 59

## 2019-08-10 LAB — GLUCOSE, CAPILLARY
Glucose-Capillary: 82 mg/dL (ref 70–99)
Glucose-Capillary: 132 mg/dL — ABNORMAL HIGH (ref 70–99)
Glucose-Capillary: 155 mg/dL — ABNORMAL HIGH (ref 70–99)
Glucose-Capillary: 141 mg/dL — ABNORMAL HIGH (ref 70–99)

## 2019-08-10 LAB — COOXEMETRY PANEL
Carboxyhemoglobin: 2.3 % — ABNORMAL HIGH (ref 0.5–1.5)
Methemoglobin: 1.2 % (ref 0.0–1.5)
O2 Saturation: 73.8 %
Total hemoglobin: 8.6 g/dL — ABNORMAL LOW (ref 12.0–16.0)

## 2019-08-10 LAB — CBC
HCT: 26.5 % — ABNORMAL LOW (ref 39.0–52.0)
Hemoglobin: 8.5 g/dL — ABNORMAL LOW (ref 13.0–17.0)
MCH: 31 pg (ref 26.0–34.0)
MCHC: 32.1 g/dL (ref 30.0–36.0)
MCV: 96.7 fL (ref 80.0–100.0)
Platelets: 102 10*3/uL — ABNORMAL LOW (ref 150–400)
RBC: 2.74 MIL/uL — ABNORMAL LOW (ref 4.22–5.81)
RDW: 17.6 % — ABNORMAL HIGH (ref 11.5–15.5)
WBC: 6.7 10*3/uL (ref 4.0–10.5)
nRBC: 0 % (ref 0.0–0.2)

## 2019-08-10 LAB — BASIC METABOLIC PANEL
Anion gap: 9 (ref 5–15)
BUN: 13 mg/dL (ref 6–20)
CO2: 29 mmol/L (ref 22–32)
Calcium: 7.8 mg/dL — ABNORMAL LOW (ref 8.9–10.3)
Chloride: 99 mmol/L (ref 98–111)
Creatinine, Ser: 0.68 mg/dL (ref 0.61–1.24)
GFR calc Af Amer: >60 mL/min (ref >60)
GFR calc non Af Amer: >60 mL/min (ref >60)
Glucose, Bld: 125 mg/dL — ABNORMAL HIGH (ref 70–99)
Potassium: 3.9 mmol/L (ref 3.5–5.1)
Sodium: 137 mmol/L (ref 135–145)

## 2019-08-10 LAB — TYPE AND SCREEN
ABO/RH(D): A POS
Antibody Screen: NEGATIVE
Unit division: 0

## 2019-08-10 LAB — BPAM RBC
Blood Product Expiration Date: 202106112359
ISSUE DATE / TIME: 202106060646
Unit Type and Rh: 6200

## 2019-08-10 MED ORDER — INSULIN ASPART 100 UNIT/ML ~~LOC~~ SOLN
0.0000 [IU] | Freq: Three times a day (TID) | SUBCUTANEOUS | Status: DC
  Administered 2019-08-10 – 2019-08-11 (×2): 2 [IU] via SUBCUTANEOUS
  Administered 2019-08-11 – 2019-08-12 (×2): 4 [IU] via SUBCUTANEOUS
  Administered 2019-08-12 – 2019-08-13 (×2): 2 [IU] via SUBCUTANEOUS
  Administered 2019-08-13: 4 [IU] via SUBCUTANEOUS
  Administered 2019-08-13: 2 [IU] via SUBCUTANEOUS
  Administered 2019-08-14: 8 [IU] via SUBCUTANEOUS
  Administered 2019-08-15: 4 [IU] via SUBCUTANEOUS
  Administered 2019-08-15: 2 [IU] via SUBCUTANEOUS
  Administered 2019-08-16: 4 [IU] via SUBCUTANEOUS
  Administered 2019-08-17: 2 [IU] via SUBCUTANEOUS

## 2019-08-10 MED ORDER — ACETAMINOPHEN 325 MG PO TABS
650.0000 mg | ORAL_TABLET | Freq: Four times a day (QID) | ORAL | Status: DC | PRN
  Administered 2019-08-10 – 2019-08-17 (×23): 650 mg via ORAL
  Filled 2019-08-10 (×24): qty 2

## 2019-08-10 MED ORDER — DOCUSATE SODIUM 100 MG PO CAPS
200.0000 mg | ORAL_CAPSULE | Freq: Every day | ORAL | Status: DC
  Administered 2019-08-11 – 2019-08-17 (×7): 200 mg via ORAL
  Filled 2019-08-10 (×7): qty 2

## 2019-08-10 MED ORDER — BISACODYL 5 MG PO TBEC: 10.0000 mg | DELAYED_RELEASE_TABLET | Freq: Every day | ORAL | Status: DC | PRN

## 2019-08-10 MED ORDER — ONDANSETRON HCL 4 MG PO TABS: 4.0000 mg | ORAL_TABLET | Freq: Four times a day (QID) | ORAL | Status: DC | PRN

## 2019-08-10 MED ORDER — ASPIRIN EC 325 MG PO TBEC
325.0000 mg | DELAYED_RELEASE_TABLET | Freq: Every day | ORAL | Status: DC
  Administered 2019-08-11 – 2019-08-17 (×7): 325 mg via ORAL
  Filled 2019-08-10 (×7): qty 1

## 2019-08-10 MED ORDER — SODIUM CHLORIDE 0.9% FLUSH: 3.0000 mL | INTRAVENOUS | Status: DC | PRN

## 2019-08-10 MED ORDER — INSULIN DETEMIR 100 UNIT/ML ~~LOC~~ SOLN
10.0000 [IU] | Freq: Every day | SUBCUTANEOUS | Status: DC
  Administered 2019-08-10 – 2019-08-12 (×3): 10 [IU] via SUBCUTANEOUS
  Filled 2019-08-10 (×4): qty 0.1

## 2019-08-10 MED ORDER — ONDANSETRON HCL 4 MG/2ML IJ SOLN: 4.0000 mg | Freq: Four times a day (QID) | INTRAMUSCULAR | Status: DC | PRN

## 2019-08-10 MED ORDER — BISACODYL 10 MG RE SUPP: 10.0000 mg | Freq: Every day | RECTAL | Status: DC | PRN

## 2019-08-10 MED ORDER — SODIUM CHLORIDE 0.9 % IV SOLN: 250.0000 mL | INTRAVENOUS | Status: DC | PRN

## 2019-08-10 MED ORDER — TRAMADOL HCL 50 MG PO TABS: 50.0000 mg | ORAL_TABLET | ORAL | Status: DC | PRN

## 2019-08-10 MED ORDER — SODIUM CHLORIDE 0.9% FLUSH
3.0000 mL | Freq: Two times a day (BID) | INTRAVENOUS | Status: DC
  Administered 2019-08-10 – 2019-08-17 (×4): 3 mL via INTRAVENOUS

## 2019-08-10 MED ORDER — ~~LOC~~ CARDIAC SURGERY, PATIENT & FAMILY EDUCATION: Freq: Once | Status: DC

## 2019-08-10 MED ORDER — OXYCODONE HCL 5 MG PO TABS: 5.0000 mg | ORAL_TABLET | ORAL | Status: DC | PRN

## 2019-08-10 MED ORDER — PANTOPRAZOLE SODIUM 40 MG PO TBEC
40.0000 mg | DELAYED_RELEASE_TABLET | Freq: Every day | ORAL | Status: DC
  Administered 2019-08-11 – 2019-08-17 (×7): 40 mg via ORAL
  Filled 2019-08-10 (×7): qty 1

## 2019-08-10 MED ORDER — FUROSEMIDE 40 MG PO TABS
40.0000 mg | ORAL_TABLET | Freq: Every day | ORAL | Status: DC
  Administered 2019-08-10 – 2019-08-11 (×2): 40 mg via ORAL
  Filled 2019-08-10 (×2): qty 1

## 2019-08-10 MED ORDER — METOLAZONE 5 MG PO TABS
2.5000 mg | ORAL_TABLET | Freq: Every day | ORAL | Status: DC
  Administered 2019-08-10 – 2019-08-11 (×2): 2.5 mg via ORAL
  Filled 2019-08-10 (×2): qty 1

## 2019-08-10 MED ORDER — POTASSIUM CHLORIDE CRYS ER 20 MEQ PO TBCR
20.0000 meq | EXTENDED_RELEASE_TABLET | Freq: Three times a day (TID) | ORAL | Status: DC
  Administered 2019-08-10 – 2019-08-11 (×4): 20 meq via ORAL
  Filled 2019-08-10 (×5): qty 1

## 2019-08-10 MED ORDER — CARVEDILOL 3.125 MG PO TABS
3.1250 mg | ORAL_TABLET | Freq: Two times a day (BID) | ORAL | Status: DC
  Administered 2019-08-10 – 2019-08-17 (×14): 3.125 mg via ORAL
  Filled 2019-08-10 (×14): qty 1

## 2019-08-10 NOTE — Progress Notes (Signed)
CARDIAC REHAB PHASE I   PRE:  Rate/Rhythm: 81 SR  BP:  Sitting: 140/100      SaO2: 95 RA  MODE:  Ambulation: 740 ft   POST:  Rate/Rhythm: 103 ST  BP:  Sitting: 150/105    SaO2: 83 RA --> 97 3L  Pt ambulated 747f in hallway standby assist with EVA. Pt took one long standing rest break to enjoy the sun, c/o SOB. Sats 83 on RA, placed on 3L [place], sats rose to 97. Pt returned to recliner. Lunch tray, call bell, and cell phone within reach. Encouraged continued IS use, walks, and sternal precautions. Will continue to follow.  [id number] Falco RN BSN 08/10/2019 2:39 PM

## 2019-08-10 NOTE — Progress Notes (Signed)
4 Days Post-Op Procedure(s) (LRB): EXPLORATION POST OPERATIVE OPEN HEART (N/A) Subjective: Feels ok. Weak legs making it difficult to get up from chair without assistance but able to walk with minimal assistance.  Objective: Vital signs in last 24 hours: Temp:  [98 F (36.7 C)-98.8 F (37.1 C)] 98 F (36.7 C) (06/07 0344) Pulse Rate:  [65-165] 75 (06/07 0700) Cardiac Rhythm: Normal sinus rhythm (06/07 0400) Resp:  [15-28] 19 (06/07 0700) BP: (133-162)/(61-89) 154/85 (06/07 0700) SpO2:  [90 %-100 %] 98 % (06/07 0700) Weight:  [119.1 kg] 119.1 kg (06/07 0500)  Hemodynamic parameters for last 24 hours:    Intake/Output from previous day: 06/06 0701 - 06/07 0700 In: 1483.4 [P.O.:1040; I.V.:117.4; Blood:326] Out: 2993 [Urine:3600; Chest Tube:120] Intake/Output this shift: No intake/output data recorded.  General appearance: alert and cooperative Neurologic: intact Heart: regular rate and rhythm Lungs: clear to auscultation bilaterally Extremities: edema moderate in legs Wound: dressing dry  Lab Results: Recent Labs    08/09/19 0336 08/09/19 0336 08/09/19 1017 08/10/19 0331  WBC 8.8  --   --  6.7  HGB 7.5*   < > 8.5* 8.5*  HCT 24.2*   < > 27.3* 26.5*  PLT 84*  --   --  102*   < > = values in this interval not displayed.   BMET:  Recent Labs    08/09/19 0336 08/10/19 0331  NA 138 137  K 3.5 3.9  CL 100 99  CO2 29 29  GLUCOSE 90 125*  BUN 11 13  CREATININE 0.63 0.68  CALCIUM 7.7* 7.8*    PT/INR: No results for input(s): LABPROT, INR in the last 72 hours. ABG    Component Value Date/Time   PHART 7.441 08/07/2019 1103   HCO3 26.5 08/07/2019 1103   TCO2 28 08/07/2019 1103   ACIDBASEDEF 1.0 08/06/2019 2310   O2SAT 73.8 08/10/2019 0331   CBG (last 3)  Recent Labs    08/09/19 1533 08/09/19 2126 08/10/19 0656  GLUCAP 124* 174* 82   CXR: ok  Assessment/Plan:  POD 4 AVR and postop exploration for bleeding.  Hemodynamically stable in sinus rhythm.  Co-ox 74%. Will stop milrinone. BP 150's so stop midodrine. If BP remains stable today will probably start low dose Coreg tomorrow.  Volume excess: Continue diuresis with lasix and metolazone. Replace K+  DM: glucose under control. Will decrease Levemir and continue SSI.  Acute postop blood loss anemia: stable. Continue iron.  DC chest tubes, left arm mid line. Continue right arm PICC.  PT  CIR consult per PT.  Transfer to 4E.   LOS: 20 days    Gaye Pollack 08/10/2019

## 2019-08-10 NOTE — Progress Notes (Signed)
Mobility Specialist: Progress Note    08/10/19 1648  Mobility  Activity Ambulated in hall  Level of Assistance Moderate assist, patient does 50-74%  Assistive Device Front wheel walker  Distance Ambulated (ft) 960 ft  Mobility Response Tolerated well  Mobility performed by Mobility specialist  $Mobility charge 1 Mobility   Pre-Mobility: 82 HR, 139/76 BP, 98% SpO2 Post-Mobility: 95% SpO2  Pt tolerated ambulation well. Pt c/o SOB and took one standing rest break a little over halfway through walk.   [facility] Tyshawn Keel Mobility Specialist

## 2019-08-10 NOTE — Plan of Care (Addendum)
Awaiting patient to void after foley d/c 6/4 @ 1800.  Patient OOB to chair and ambulated in hall with assistance.  No reports of pain at this time, will continue to monitor closely   0100 Pt was able to succussfully void after ambulating

## 2019-08-10 NOTE — Progress Notes (Signed)
Inpatient Rehab Admissions:  Inpatient Rehab Consult received.  I met with patient at the bedside for rehabilitation assessment and to discuss goals and expectations of an inpatient rehab admission.  Pt mobilizing well once up, but endorses significant difficulty with bed mobility/transfers and DOE.  We discussed ongoing diuresis and continued therapy/mobilization while in the acute setting and that he may not ultimately need intensive CIR prior to discharge.  Will follow along for now, and begin process of insurance authorization.   Signed: Shann Medal, PT, DPT Admissions Coordinator [phone] 08/10/19  12:48 PM

## 2019-08-10 NOTE — Progress Notes (Signed)
Cardiology Progress Note  Patient ID: Steve Richards MRN: [id number] DOB: 1965/08/23 Date of Encounter: 08/10/2019  Primary Cardiologist: Candee Furbish, MD  Subjective   Chief Complaint: Postop  HPI: 6/3 SAVR for critical AS. Doing well. Off milrinone. Reports some tightness over surgical scar.   ROS:  All other ROS reviewed and negative. Pertinent positives noted in the HPI.     Inpatient Medications  Scheduled Meds: . acetaminophen  1,000 mg Oral Q6H   Or  . acetaminophen (TYLENOL) oral liquid 160 mg/5 mL  1,000 mg Per Tube Q6H  . acetaminophen  650 mg Rectal Once  . aspirin EC  325 mg Oral Daily   Or  . aspirin  324 mg Per Tube Daily  . bisacodyl  10 mg Oral Daily   Or  . bisacodyl  10 mg Rectal Daily  . chlorhexidine  15 mL Mouth/Throat BID  . Chlorhexidine Gluconate Cloth  6 each Topical Daily  . docusate sodium  200 mg Oral Daily  . enoxaparin (LOVENOX) injection  40 mg Subcutaneous Q24H  . feeding supplement (ENSURE ENLIVE)  237 mL Oral BID BM  . feeding supplement (PRO-STAT SUGAR FREE 64)  30 mL Oral Daily  . ferrous [id number] C-folic acid  1 capsule Oral TID PC  . furosemide  40 mg Oral Daily  . insulin aspart  0-24 Units Subcutaneous TID WC & HS  . insulin detemir  10 Units Subcutaneous Daily  . levothyroxine  25 mcg Oral Q0600  . metolazone  2.5 mg Oral Daily  . multivitamin with minerals  1 tablet Oral Daily  . pantoprazole  40 mg Oral Daily  . potassium chloride  40 mEq Oral BID  . sodium chloride flush  10-40 mL Intracatheter Q12H   Continuous Infusions: . lactated ringers Stopped (08/09/19 0141)   PRN Meds: ondansetron (ZOFRAN) IV, oxyCODONE, sodium chloride flush, sodium chloride flush, traMADol   Vital Signs   Vitals:   08/10/19 0700 08/10/19 0800 08/10/19 0900 08/10/19 1000  BP: (!) 154/85 137/69 (!) 148/81   Pulse: 75 81 76   Resp: 19 (!) 27 19 (!) 25  Temp: 98.4 F (36.9 C)     TempSrc: Oral     SpO2: 98% 98% 96%   Weight:        Height:        Intake/Output Summary (Last 24 hours) at 08/10/2019 1035 Last data filed at 08/10/2019 0800 Gross per 24 hour  Intake 906.13 ml  Output 3570 ml  Net -2663.87 ml   Last 3 Weights 08/10/2019 08/09/2019 08/08/2019  Weight (lbs) 262 lb 9.1 oz 264 lb 15.9 oz 268 lb 11.9 oz  Weight (kg) 119.1 kg 120.2 kg 121.9 kg      Telemetry  Overnight telemetry shows SR 70s, which I personally reviewed.   ECG  The most recent ECG shows NSR, lateral TWI, which I personally reviewed.   Physical Exam   Vitals:   08/10/19 0700 08/10/19 0800 08/10/19 0900 08/10/19 1000  BP: (!) 154/85 137/69 (!) 148/81   Pulse: 75 81 76   Resp: 19 (!) 27 19 (!) 25  Temp: 98.4 F (36.9 C)     TempSrc: Oral     SpO2: 98% 98% 96%   Weight:      Height:         Intake/Output Summary (Last 24 hours) at 08/10/2019 1035 Last data filed at 08/10/2019 0800 Gross per 24 hour  Intake 906.13 ml  Output 3570 ml  Net -  5379.43 ml    Last 3 Weights 08/10/2019 08/09/2019 08/08/2019  Weight (lbs) 262 lb 9.1 oz 264 lb 15.9 oz 268 lb 11.9 oz  Weight (kg) 119.1 kg 120.2 kg 121.9 kg    Body mass index is 35.61 kg/m.  General: Well nourished, well developed, in no acute distress Head: Atraumatic, normal size  Eyes: PEERLA, EOMI  Neck: Supple, no JVD Endocrine: No thryomegaly Cardiac: Normal S1, S2; RRR; no murmurs, rubs, or gallops Lungs: diminished breath sounds  Abd: Soft, nontender, no hepatomegaly  Ext: 1+ pitting edema  Musculoskeletal: No deformities, BUE and BLE strength normal and equal Skin: surgical scar well healed    Neuro: Alert and oriented to person, place, time, and situation, CNII-XII grossly intact, no focal deficits  Psych: Normal mood and affect   Labs  High Sensitivity Troponin:   Recent Labs  Lab 07/21/19 0820 07/21/19 1237  TROPONINIHS 47* 40*     Cardiac EnzymesNo results for input(s): TROPONINI in the last 168 hours. No results for input(s): TROPIPOC in the last 168 hours.   Chemistry Recent Labs  Lab 08/04/19 0354 08/04/19 0354 08/05/19 0248 08/05/19 0248 08/06/19 0226 08/06/19 0719 08/08/19 0410 08/09/19 0336 08/10/19 0331  NA 135   < > 136   < > 137   < > 135 138 137  K 4.4   < > 4.5   < > 4.3   < > 4.0 3.5 3.9  CL 99   < > 98   < > 97*   < > 103 100 99  CO2 28   < > 29   < > 27   < > 26 29 29   GLUCOSE 122*   < > 131*   < > 121*   < > 162* 90 125*  BUN 16   < > 17   < > 22*   < > 9 11 13   CREATININE 0.87   < > 0.86   < > 0.98   < > 0.64 0.63 0.68  CALCIUM 8.9   < > 9.1   < > 8.8*   < > 7.5* 7.7* 7.8*  PROT 7.3  --  7.8  --  7.0  --   --   --   --   ALBUMIN 3.4*  --  3.6  --  3.3*  --   --   --   --   AST 30  --  31  --  28  --   --   --   --   ALT 21  --  22  --  20  --   --   --   --   ALKPHOS 263*  --  297*  --  250*  --   --   --   --   BILITOT 2.8*  --  2.7*  --  2.8*  --   --   --   --   GFRNONAA >60   < > >60   < > >60   < > >60 >60 >60  GFRAA >60   < > >60   < > >60   < > >60 >60 >60  ANIONGAP 8   < > 9   < > 13   < > 6 9 9    < > = values in this interval not displayed.    Hematology Recent Labs  Lab 08/08/19 0410 08/08/19 0410 08/09/19 0336 08/09/19 1017 08/10/19 0331  WBC 14.9*  --  8.8  --  6.7  RBC 2.72*  --  2.51*  --  2.74*  HGB 8.2*   < > 7.5* 8.5* 8.5*  HCT 25.8*   < > 24.2* 27.3* 26.5*  MCV 94.9  --  96.4  --  96.7  MCH 30.1  --  29.9  --  31.0  MCHC 31.8  --  31.0  --  32.1  RDW 18.2*  --  18.1*  --  17.6*  PLT 111*  --  84*  --  102*   < > = values in this interval not displayed.   BNPNo results for input(s): BNP, PROBNP in the last 168 hours.  DDimer  Recent Labs  Lab 08/05/19 0248  DDIMER 1.87*     Radiology  DG Chest Port 1 View  Result Date: 08/10/2019 CLINICAL DATA:  Post CABG.  Chest tube present. EXAM: PORTABLE CHEST 1 VIEW COMPARISON:  08/09/2019; 08/08/2019 FINDINGS: Grossly unchanged enlarged cardiac silhouette and mediastinal contours post median sternotomy and valve replacement. Interval  removal of right jugular approach vascular sheath and placement of right upper extremity approach PICC line. Stable position of bilateral chest tubes. No pneumothorax. Interval development of small left and trace right-sided pleural effusions. Mild pulmonary edema with slight worsening of bibasilar opacities, left greater than right. Old right-sided rib fractures. No acute osseous abnormalities. IMPRESSION: 1. Interval removal of right jugular approach vascular sheath and placement of right upper extremity approach PICC line with tip projected over the superior cavoatrial junction. 2. Stable positioning of bilateral chest tubes.  No pneumothorax. 3. Suspected slight worsening of pulmonary edema with small left and trace right-sided effusions and associated bibasilar opacities, likely atelectasis. Electronically Signed   By: Sandi Mariscal M.D.   On: 08/10/2019 08:10   DG Chest Port 1 View  Result Date: 08/09/2019 CLINICAL DATA:  CHF following cardiac surgery.  Postop. EXAM: PORTABLE CHEST 1 VIEW COMPARISON:  08/08/2019 FINDINGS: RIGHT IJ sheath remains in place following removal of Swan-Ganz catheter. Bilateral chest tubes remain in place. Stable cardiomegaly. Minimal subsegmental atelectasis at both lung bases, stable in appearance. No pneumothorax. IMPRESSION: 1. Stable cardiomegaly. 2. Stable bibasilar atelectasis. Electronically Signed   By: Nolon Nations M.D.   On: 08/09/2019 09:13   [place] EKG SITE RITE  Result Date: 08/08/2019 If Site Rite image not attached, placement could not be confirmed due to current cardiac rhythm.   Cardiac Studies  TTE 07/21/2019 1. Left ventricular ejection fraction, by estimation, is 40 to 45%. The  left ventricle has mildly decreased function. The left ventricle  demonstrates global hypokinesis. There is moderate left ventricular  hypertrophy. Left ventricular diastolic  parameters are consistent with Grade II diastolic dysfunction  (pseudonormalization).  2. Right  ventricular systolic function is mildly reduced. The right  ventricular size is normal. There is moderately elevated pulmonary artery  systolic pressure.  3. Left atrial size was moderately dilated.  4. The mitral valve is abnormal. Trivial mitral valve regurgitation.  5. Cannot exclude a bicuspid valve. Aortic valve regurgitation is mild.  Critical aortic valve stenosis. Aortic valve area, by VTI measures 0.73  cm. Aortic valve mean gradient measures 96.2 mmHg. Peak gradient of 137  mmHg. Aortic valve Vmax measures  5.86 m/s.  6. The inferior vena cava is dilated in size with <50% respiratory  variability, suggesting right atrial pressure of 15 mmHg.  7. Moderate pleural effusion in the right lateral region.   Patient Profile  Steve Richards is a 54 y.o. male  with cirrhosis admitted 07/21/2019 for covid and found to have critical AS and systolic HF.   Assessment & Plan   1. Critical AS/Systolic HF -s/p SAVR 25 mm inspiris prosthetic AoV 08/06/2019 and re-do sternotomy due to pericardial bleeding 6/3 -doing well postop. Off milrinone and plans for chest tube removal today.  -still volume up. Will plan for IV diuresis once out of ICU and stable off inotropes.  -will work to add GDMT for his cardiomyopathy -suspect cirrhosis was congestion for CHF/critical AS  For questions or updates, please contact [place] Please consult [email or web address] for contact info under   Time Spent with Patient: I have spent a total of 15 minutes with patient reviewing hospital notes, telemetry, EKGs, labs and examining the patient as well as establishing an assessment and plan that was discussed with the patient.  > 50% of time was spent in direct patient care.    Signed, Addison Naegeli. Audie Box, [place]  08/10/2019 10:35 AM

## 2019-08-10 NOTE — Plan of Care (Signed)
°  Problem: Education: Goal: Knowledge of General Education information will improve Description: Including pain rating scale, medication(s)/side effects and non-pharmacologic comfort measures Outcome: Not Progressing   Problem: Health Behavior/Discharge Planning: Goal: Ability to manage health-related needs will improve Outcome: Not Progressing   Problem: Clinical Measurements: Goal: Ability to maintain clinical measurements within normal limits will improve Outcome: Not Progressing Goal: Will remain free from infection Outcome: Not Progressing Goal: Diagnostic test results will improve Outcome: Not Progressing Goal: Respiratory complications will improve Outcome: Not Progressing Goal: Cardiovascular complication will be avoided Outcome: Not Progressing   Problem: Activity: Goal: Risk for activity intolerance will decrease Outcome: Not Progressing   Problem: Coping: Goal: Level of anxiety will decrease Outcome: Not Progressing   Problem: Nutrition: Goal: Adequate nutrition will be maintained Outcome: Not Progressing

## 2019-08-11 LAB — GLUCOSE, CAPILLARY
Glucose-Capillary: 106 mg/dL — ABNORMAL HIGH (ref 70–99)
Glucose-Capillary: 145 mg/dL — ABNORMAL HIGH (ref 70–99)
Glucose-Capillary: 113 mg/dL — ABNORMAL HIGH (ref 70–99)
Glucose-Capillary: 186 mg/dL — ABNORMAL HIGH (ref 70–99)

## 2019-08-11 MED ORDER — LISINOPRIL 5 MG PO TABS
5.0000 mg | ORAL_TABLET | Freq: Every day | ORAL | Status: DC
  Administered 2019-08-11: 5 mg via ORAL
  Filled 2019-08-11: qty 1

## 2019-08-11 MED ORDER — FUROSEMIDE 10 MG/ML IJ SOLN
40.0000 mg | Freq: Two times a day (BID) | INTRAMUSCULAR | Status: DC
  Administered 2019-08-11 (×2): 40 mg via INTRAVENOUS
  Filled 2019-08-11 (×2): qty 4

## 2019-08-11 MED ORDER — CHLORHEXIDINE GLUCONATE CLOTH 2 % EX PADS
6.0000 | MEDICATED_PAD | Freq: Every day | CUTANEOUS | Status: DC
  Administered 2019-08-12 – 2019-08-17 (×5): 6 via TOPICAL

## 2019-08-11 NOTE — Progress Notes (Signed)
Mobility Specialist: Progress Note    08/11/19 1349  Mobility  Activity Ambulated in hall  Level of Assistance Modified independent, requires aide device or extra time  Assistive Device Front wheel walker  Distance Ambulated (ft) 1880 ft  Mobility Response Tolerated well  Mobility performed by Mobility specialist  $Mobility charge 1 Mobility   Pre-Mobility: 79 HR, 138/86 BP, 94% SpO2 During Mobility: 95% SpO2 Post-Mobility: 77 HR, 148/93 BP, 94% SpO2  Pt started out on 3L/min [place] during ambulation. During ambulation I dropped [place] flow to 2L/min w/O2 remaining in the low to mid 90s. Pt c/o of SOB once and had to take a standing rest break. Pt stated he felt fine after returning to room.   [facility] Mukhtar Shams Mobility Specialist

## 2019-08-11 NOTE — Evaluation (Signed)
Occupational Therapy Evaluation Patient Details Name: Steve Richards MRN: [id number] DOB: Oct 05, 1965 Today's Date: 08/11/2019    History of Present Illness 53y.o. male admitted on 07/22/19 for SOB and LE edema.  Dx with acute systolic/diastolic CHF, volume overload, COVID (+), LE dopplers pending to r/o DVT due to elevated D dimer, liver cirrhosis, L LE cellulits .  Pt with significant PMH of  Pleural effusion, cirrhosis of the liver with ascites d/p thoracentesis.  Caridology consulted and also found severe aortic stenosis.  Bil LE dopplers negative for DVT.  Due to have open valve replacement the week of 5/29.  Patient s/p open AVR/TEE 08/06/19.   Clinical Impression   This 54 yo male admitted and underwent above presents to acute OT with PLOF of living alone, being totally independent with all basic and IADLs, driving, and working as a Geophysicist/field seismologist. Currently pt is limited due to sternal precautions, increased fluid load, and LE weakness thus affecting him being independent with bed mobility, sit<>stand from normal height surfaces, get his lower body bathed/dressed, getting on off toilet, performing toileting hygiene, and getting in and out of a tub that does not have grab bars. He is Max A sit<>stand from normal height surfaces, Max A for lower body bathing and dressing, and Mod A for toileting hygiene. He would greatly benefit from an inpatient rehab stay to get to a Mod I level prior to D/C back home where he lives alone. We will continue to follow.     Follow Up Recommendations  CIR;Supervision/Assistance - 24 hour    Equipment Recommendations  3 in 1 bedside commode;Tub/shower seat    Recommendations for Other Services Rehab consult     Precautions / Restrictions Precautions Precautions: Fall;Sternal Restrictions Weight Bearing Restrictions: Yes(sternal) Other Position/Activity Restrictions: Bil LEs wrapped with ace wraps (I re-wrapped them due they had shifted)      Mobility Bed  Mobility               General bed mobility comments: Pt sitting on EOB upon my arrival  Transfers Overall transfer level: Needs assistance Equipment used: Rolling walker (2 wheeled) Transfers: Sit to/from Stand Sit to Stand: Max assist(from normal height bed)              Balance Overall balance assessment: Needs assistance Sitting-balance support: No upper extremity supported;Feet supported Sitting balance-Leahy Scale: Good     Standing balance support: No upper extremity supported Standing balance-Leahy Scale: Fair                             ADL either performed or assessed with clinical judgement   ADL Overall ADL's : Needs assistance/impaired Eating/Feeding: Independent;Sitting   Grooming: Set up;Sitting   Upper Body Bathing: Set up;Sitting   Lower Body Bathing: Moderate assistance Lower Body Bathing Details (indicate cue type and reason): with Max A sit<>stand from normal height bed Upper Body Dressing : Minimal assistance;Sitting   Lower Body Dressing: Maximal assistance Lower Body Dressing Details (indicate cue type and reason): with Max A sit<>stand from normal height bed   Toilet Transfer Details (indicate cue type and reason): Max A sit<>stand from a normal height toilet with min A getting to and from toilet Toileting- Clothing Manipulation and Hygiene: Moderate assistance Toileting - Clothing Manipulation Details (indicate cue type and reason): with Max A sit<>stand from normal height toilet             Vision Patient Visual Report:  No change from baseline              Pertinent Vitals/Pain Pain Assessment: No/denies pain     Hand Dominance Right   Extremity/Trunk Assessment Upper Extremity Assessment Upper Extremity Assessment: Overall WFL for tasks assessed RUE Deficits / Details: Pt does report that hands are more edematous than normal, that normally you can see the veins in his hands and currently you cannot            Communication Communication Communication: No difficulties   Cognition Arousal/Alertness: Awake/alert Behavior During Therapy: WFL for tasks assessed/performed Overall Cognitive Status: Within Functional Limits for tasks assessed                                        Exercises Other Exercises Other Exercises: Had pt work on sit<>stand from different heights of bed and partial stand>sit and sitting down without plopping to work on LE strengthening since this is once of the issues he is having in being able to be indepedent to go home alone.        Home Living Family/patient expects to be discharged to:: Private residence Living Arrangements: Alone Available Help at Discharge: Friend(s);Available PRN/intermittently Type of Home: House Home Access: Stairs to enter [employer] of Steps: 3 in front no rails, 7 in back with bilateral rails Entrance Stairs-Rails: Left;Right Home Layout: One level     Bathroom Shower/Tub: Teacher, early years/pre: Standard     Home Equipment: None          Prior Functioning/Environment Level of Independence: Independent        Comments: pt works as a Geophysicist/field seismologist.         OT Problem List: Decreased strength;Impaired balance (sitting and/or standing);Decreased knowledge of use of DME or AE      OT Treatment/Interventions: Self-care/ADL training;DME and/or AE instruction;Patient/family education;Balance training    OT Goals(Current goals can be found in the care plan section) Acute Rehab OT Goals Patient Stated Goal: To go to in patient rehab here, so I can leave there independent since I live alone OT Goal Formulation: With patient Time For Goal Achievement: 08/25/19 Potential to Achieve Goals: Good  OT Frequency: Min 2X/week   Barriers to D/C: Decreased caregiver support             AM-PAC OT "6 Clicks" Daily Activity     Outcome Measure Help from another person eating meals?:  None Help from another person taking care of personal grooming?: A Little Help from another person toileting, which includes using toliet, bedpan, or urinal?: A Lot Help from another person bathing (including washing, rinsing, drying)?: A Lot Help from another person to put on and taking off regular upper body clothing?: A Little Help from another person to put on and taking off regular lower body clothing?: A Lot 6 Click Score: 16   End of Session Equipment Utilized During Treatment: Rolling walker  Activity Tolerance: Patient tolerated treatment well Patient left: in chair;with call bell/phone within reach  OT Visit Diagnosis: Other abnormalities of gait and mobility (R26.89);Unsteadiness on feet (R26.81);Muscle weakness (generalized) (M62.81)                Time: 8250-5397 OT Time Calculation (min): 49 min Charges:  OT General Charges $OT Visit: 1 Visit OT Evaluation $OT Eval Moderate Complexity: 1 Mod OT Treatments $Self Care/Home Management :  8-22 mins $Therapeutic Activity: 8-22 mins  [address], OTR/L Acute [employer] Pager [phone] Office [phone]    Almon Register 08/11/2019, 10:49 AM

## 2019-08-11 NOTE — Progress Notes (Addendum)
      [address]       [place] [address]             [phone]      5 Days Post-Op Procedure(s) (LRB): EXPLORATION POST OPERATIVE OPEN HEART (N/A) Subjective: Feels okay this morning.   Objective: Vital signs in last 24 hours: Temp:  [97.6 F (36.4 C)-99 F (37.2 C)] 97.6 F (36.4 C) (06/08 0349) Pulse Rate:  [73-85] 79 (06/08 0349) Cardiac Rhythm: Normal sinus rhythm (06/08 0010) Resp:  [17-27] 20 (06/08 0349) BP: (117-150)/(49-105) 117/49 (06/08 0349) SpO2:  [92 %-98 %] 93 % (06/07 2343) Weight:  [116.6 kg-119.3 kg] 116.6 kg (06/08 0601)     Intake/Output from previous day: 06/07 0701 - 06/08 0700 In: 2.7 [I.V.:2.7] Out: 3100 [Urine:3000; Chest Tube:100] Intake/Output this shift: No intake/output data recorded.  General appearance: alert, cooperative and no distress Heart: regular rate and rhythm, S1, S2 normal, no murmur, click, rub or gallop Lungs: clear to auscultation bilaterally Abdomen: soft, non-tender; bowel sounds normal; no masses,  no organomegaly Extremities: extremities normal, atraumatic, no cyanosis or edema Wound: clean and dry  Lab Results: Recent Labs    08/09/19 0336 08/09/19 0336 08/09/19 1017 08/10/19 0331  WBC 8.8  --   --  6.7  HGB 7.5*   < > 8.5* 8.5*  HCT 24.2*   < > 27.3* 26.5*  PLT 84*  --   --  102*   < > = values in this interval not displayed.   BMET:  Recent Labs    08/09/19 0336 08/10/19 0331  NA 138 137  K 3.5 3.9  CL 100 99  CO2 29 29  GLUCOSE 90 125*  BUN 11 13  CREATININE 0.63 0.68  CALCIUM 7.7* 7.8*    PT/INR: No results for input(s): LABPROT, INR in the last 72 hours. ABG    Component Value Date/Time   PHART 7.441 08/07/2019 1103   HCO3 26.5 08/07/2019 1103   TCO2 28 08/07/2019 1103   ACIDBASEDEF 1.0 08/06/2019 2310   O2SAT 73.8 08/10/2019 0331   CBG (last 3)  Recent Labs    08/10/19 1742 08/10/19 2125 08/11/19 0635  GLUCAP 155* 141* 106*    Assessment/Plan: S/P  Procedure(s) (LRB): EXPLORATION POST OPERATIVE OPEN HEART (N/A), AVR  1. CV- NSR in the 80s, BP well controlled. Continue asa and carvedilol. Not on a statin. coox 73.8 2. Pulm-tolerating 2L [place]. Continue to wean as able.Discontinued chest tubes  3. Endo-blood glucose well controlled 4. Renal-creatinine 0.68, electrolytes okay continue lasix for fluid overload 5. CIR eval per PT. They stated he might not need at the time of discharge  Plan: Feels good today. Some numbness in his tongue and left face but this is improving. He also has some numbness in his right hand.  He may need some assistance with CIR on discharge.    LOS: 21 days    Elgie Collard 08/11/2019   Chart reviewed, patient examined, agree with above. He is making progress. Diuresed 3L yesterday. Wt almost down to preop but he was volume overloaded preop. Will continue diuresis until lower extremity edema resolved.  Check labs in am.

## 2019-08-11 NOTE — Progress Notes (Signed)
CARDIAC REHAB PHASE I   Went to offer to walk with pt. Pt ambulating in hallway with mobility tech. HR noted to be in the 90s, sats 94 on 2L. Encouraged continued ambulation, IS use, and walks. Pt worked with OT earlier, and has plans to work with PT later. Will continue to follow.  [phone] Rufina Falco, RN BSN 08/11/2019 1:45 PM

## 2019-08-11 NOTE — Progress Notes (Signed)
Cardiology Progress Note  Patient ID: Steve Richards MRN: [id number] DOB: 07-Aug-1965 Date of Encounter: 08/11/2019  Primary Cardiologist: Candee Furbish, MD  Subjective   Chief Complaint: LE edema  HPI: Net negative 3L but still volume overloaded. Out of the ICU and doing well. Still needs diuresis.   ROS:  All other ROS reviewed and negative. Pertinent positives noted in the HPI.     Inpatient Medications  Scheduled Meds:  aspirin EC  325 mg Oral Daily   carvedilol  3.125 mg Oral BID WC   chlorhexidine  15 mL Mouth/Throat BID   [place] [facility], Patient & Family Education   Does not apply Once   docusate sodium  200 mg Oral Daily   enoxaparin (LOVENOX) injection  40 mg Subcutaneous Q24H   feeding supplement (ENSURE ENLIVE)  237 mL Oral BID BM   feeding supplement (PRO-STAT SUGAR FREE 64)  30 mL Oral Daily   ferrous [id number] C-folic acid  1 capsule Oral TID PC   furosemide  40 mg Intravenous BID   insulin aspart  0-24 Units Subcutaneous TID AC & HS   insulin detemir  10 Units Subcutaneous Daily   levothyroxine  25 mcg Oral Q0600   lisinopril  5 mg Oral Daily   metolazone  2.5 mg Oral Daily   multivitamin with minerals  1 tablet Oral Daily   pantoprazole  40 mg Oral QAC breakfast   potassium chloride  20 mEq Oral TID   sodium chloride flush  10-40 mL Intracatheter Q12H   sodium chloride flush  3 mL Intravenous Q12H   Continuous Infusions:  sodium chloride     PRN Meds: sodium chloride, acetaminophen, bisacodyl **OR** bisacodyl, ondansetron **OR** ondansetron (ZOFRAN) IV, oxyCODONE, sodium chloride flush, sodium chloride flush, traMADol   Vital Signs   Vitals:   08/10/19 2343 08/11/19 0349 08/11/19 0601 08/11/19 0829  BP: 130/85 (!) 117/49  125/74  Pulse: 73 79  85  Resp: (!) 21 20  18   Temp: 98.1 F (36.7 C) 97.6 F (36.4 C)  98.4 F (36.9 C)  TempSrc: Oral Oral  Oral  SpO2: 93%   93%  Weight:  119.3 kg 116.6 kg   Height:         Intake/Output Summary (Last 24 hours) at 08/11/2019 1209 Last data filed at 08/11/2019 9983 Gross per 24 hour  Intake --  Output 2375 ml  Net -2375 ml   Last 3 Weights 08/11/2019 08/11/2019 08/10/2019  Weight (lbs) 257 lb 0.9 oz 263 lb 0.1 oz 262 lb 9.1 oz  Weight (kg) 116.6 kg 119.3 kg 119.1 kg      Telemetry  Overnight telemetry shows SR 70s, which I personally reviewed.   Physical Exam   Vitals:   08/10/19 2343 08/11/19 0349 08/11/19 0601 08/11/19 0829  BP: 130/85 (!) 117/49  125/74  Pulse: 73 79  85  Resp: (!) 21 20  18   Temp: 98.1 F (36.7 C) 97.6 F (36.4 C)  98.4 F (36.9 C)  TempSrc: Oral Oral  Oral  SpO2: 93%   93%  Weight:  119.3 kg 116.6 kg   Height:         Intake/Output Summary (Last 24 hours) at 08/11/2019 1209 Last data filed at 08/11/2019 0218 Gross per 24 hour  Intake --  Output 2375 ml  Net -2375 ml    Last 3 Weights 08/11/2019 08/11/2019 08/10/2019  Weight (lbs) 257 lb 0.9 oz 263 lb 0.1 oz 262 lb 9.1 oz  Weight (  kg) 116.6 kg 119.3 kg 119.1 kg    Body mass index is 34.86 kg/m.   General: Well nourished, well developed, in no acute distress Head: Atraumatic, normal size  Eyes: PEERLA, EOMI  Neck: Supple, no JVD Endocrine: No thryomegaly Cardiac: Normal S1, S2; RRR; no murmurs, rubs, or gallops Lungs: Clear to auscultation bilaterally, no wheezing, rhonchi or rales  Abd: Soft, nontender, no hepatomegaly  Ext: 2+ LE edema  Musculoskeletal: No deformities, BUE and BLE strength normal and equal Skin: Warm and dry, clean sternotomy scar   Neuro: Alert and oriented to person, place, time, and situation, CNII-XII grossly intact, no focal deficits  Psych: Normal mood and affect   Labs  High Sensitivity Troponin:   Recent Labs  Lab 07/21/19 0820 07/21/19 1237  TROPONINIHS 47* 40*     Cardiac EnzymesNo results for input(s): TROPONINI in the last 168 hours. No results for input(s): TROPIPOC in the last 168 hours.  Chemistry Recent Labs  Lab  08/05/19 0248 08/05/19 0248 08/06/19 0226 08/06/19 0719 08/08/19 0410 08/09/19 0336 08/10/19 0331  NA 136   < > 137   < > 135 138 137  K 4.5   < > 4.3   < > 4.0 3.5 3.9  CL 98   < > 97*   < > 103 100 99  CO2 29   < > 27   < > 26 29 29   GLUCOSE 131*   < > 121*   < > 162* 90 125*  BUN 17   < > 22*   < > 9 11 13   CREATININE 0.86   < > 0.98   < > 0.64 0.63 0.68  CALCIUM 9.1   < > 8.8*   < > 7.5* 7.7* 7.8*  PROT 7.8  --  7.0  --   --   --   --   ALBUMIN 3.6  --  3.3*  --   --   --   --   AST 31  --  28  --   --   --   --   ALT 22  --  20  --   --   --   --   ALKPHOS 297*  --  250*  --   --   --   --   BILITOT 2.7*  --  2.8*  --   --   --   --   GFRNONAA >60   < > >60   < > >60 >60 >60  GFRAA >60   < > >60   < > >60 >60 >60  ANIONGAP 9   < > 13   < > 6 9 9    < > = values in this interval not displayed.    Hematology Recent Labs  Lab 08/08/19 0410 08/08/19 0410 08/09/19 0336 08/09/19 1017 08/10/19 0331  WBC 14.9*  --  8.8  --  6.7  RBC 2.72*  --  2.51*  --  2.74*  HGB 8.2*   < > 7.5* 8.5* 8.5*  HCT 25.8*   < > 24.2* 27.3* 26.5*  MCV 94.9  --  96.4  --  96.7  MCH 30.1  --  29.9  --  31.0  MCHC 31.8  --  31.0  --  32.1  RDW 18.2*  --  18.1*  --  17.6*  PLT 111*  --  84*  --  102*   < > = values in this interval not displayed.  BNPNo results for input(s): BNP, PROBNP in the last 168 hours.  DDimer  Recent Labs  Lab 08/05/19 0248  DDIMER 1.87*     Radiology  DG Chest Port 1 View  Result Date: 08/10/2019 CLINICAL DATA:  Post CABG.  Chest tube present. EXAM: PORTABLE CHEST 1 VIEW COMPARISON:  08/09/2019; 08/08/2019 FINDINGS: Grossly unchanged enlarged cardiac silhouette and mediastinal contours post median sternotomy and valve replacement. Interval removal of right jugular approach vascular sheath and placement of right upper extremity approach PICC line. Stable position of bilateral chest tubes. No pneumothorax. Interval development of small left and trace right-sided  pleural effusions. Mild pulmonary edema with slight worsening of bibasilar opacities, left greater than right. Old right-sided rib fractures. No acute osseous abnormalities. IMPRESSION: 1. Interval removal of right jugular approach vascular sheath and placement of right upper extremity approach PICC line with tip projected over the superior cavoatrial junction. 2. Stable positioning of bilateral chest tubes.  No pneumothorax. 3. Suspected slight worsening of pulmonary edema with small left and trace right-sided effusions and associated bibasilar opacities, likely atelectasis. Electronically Signed   By: Sandi Mariscal M.D.   On: 08/10/2019 08:10    Cardiac Studies  TTE 07/21/2019 1. Left ventricular ejection fraction, by estimation, is 40 to 45%. The  left ventricle has mildly decreased function. The left ventricle  demonstrates global hypokinesis. There is moderate left ventricular  hypertrophy. Left ventricular diastolic  parameters are consistent with Grade II diastolic dysfunction  (pseudonormalization).  2. Right ventricular systolic function is mildly reduced. The right  ventricular size is normal. There is moderately elevated pulmonary artery  systolic pressure.  3. Left atrial size was moderately dilated.  4. The mitral valve is abnormal. Trivial mitral valve regurgitation.  5. Cannot exclude a bicuspid valve. Aortic valve regurgitation is mild.  Critical aortic valve stenosis. Aortic valve area, by VTI measures 0.73  cm. Aortic valve mean gradient measures 96.2 mmHg. Peak gradient of 137  mmHg. Aortic valve Vmax measures  5.86 m/s.  6. The inferior vena cava is dilated in size with <50% respiratory  variability, suggesting right atrial pressure of 15 mmHg.  7. Moderate pleural effusion in the right lateral region.   Patient Profile  Steve Richards is a 54 y.o. male with cirrhosis admitted 5/18 for covid and found to have critical AS and HFrEF.   Assessment & Plan   1. 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 Added lisinopril 5 mg QD today.  -cirrhosis likely related to CHF  For questions or updates, please contact [place] Please consult [email or web address] for contact info under   Time Spent with Patient: I have spent a total of 25 minutes with patient reviewing hospital notes, telemetry, EKGs, labs and examining the patient as well as establishing an assessment and plan that was discussed with the patient.  > 50% of time was spent in direct patient care.    Signed, Addison Naegeli. Audie Box, [place]  08/11/2019 12:09 PM

## 2019-08-11 NOTE — Progress Notes (Signed)
Physical Therapy Treatment Patient Details Name: Steve Richards MRN: [id number] DOB: [birth year] Today's Date: 08/11/2019    History of Present Illness 53y.o. male admitted on 07/22/19 for SOB and LE edema.  Dx with acute systolic/diastolic CHF, volume overload, COVID (+), LE dopplers pending to r/o DVT due to elevated D dimer, liver cirrhosis, L LE cellulits .  Pt with significant PMH of  Pleural effusion, cirrhosis of the liver with ascites d/p thoracentesis.  Caridology consulted and also found severe aortic stenosis.  Bil LE dopplers negative for DVT.  Due to have open valve replacement the week of 5/29.  Patient s/p open AVR/TEE 08/06/19.    PT Comments    Pt was seen for variable height practice on transfers, using heart pillow for core control and avoiding UE use at all.  Pt was taken down to lower height and worked back up to his ability to stand, gradually declining again and then practicing at least tolerated height.  Follow up with him to see how homework on this activity is going.  Will try stairs next visit, very tired and walked a great deal today.   Follow Up Recommendations  CIR     Equipment Recommendations  Rolling walker with 5" wheels    Recommendations for Other Services Rehab consult     Precautions / Restrictions Precautions Precautions: Fall;Sternal Restrictions Weight Bearing Restrictions: Yes Other Position/Activity Restrictions: sternal     Mobility  Bed Mobility               General bed mobility comments: edge of bed when PT arrives  Transfers Overall transfer level: Needs assistance Equipment used: Rolling walker (2 wheeled) Transfers: Sit to/from Stand Sit to Stand: Supervision;Min guard;Min assist         General transfer comment: varied heights of bed to practice, used heart pillow and body mechanics  Ambulation/Gait                 Stairs             Wheelchair Mobility    Modified Rankin (Stroke Patients Only)        Balance                                            Cognition Arousal/Alertness: Awake/alert Behavior During Therapy: WFL for tasks assessed/performed Overall Cognitive Status: Within Functional Limits for tasks assessed                                        Exercises      General Comments        Pertinent Vitals/Pain Pain Assessment: Faces Faces Pain Scale: Hurts little more Pain Location: back pain Pain Descriptors / Indicators: Guarding Pain Intervention(s): Limited activity within patient's tolerance;Repositioned    Home Living                      Prior Function            PT Goals (current goals can now be found in the care plan section)      Frequency    Min 3X/week      PT Plan Current plan remains appropriate    Co-evaluation  AM-PAC PT "6 Clicks" Mobility   Outcome Measure  Help needed turning from your back to your side while in a flat bed without using bedrails?: A Little Help needed moving from lying on your back to sitting on the side of a flat bed without using bedrails?: A Little Help needed moving to and from a bed to a chair (including a wheelchair)?: A Little Help needed standing up from a chair using your arms (e.g., wheelchair or bedside chair)?: A Little Help needed to walk in hospital room?: A Lot Help needed climbing 3-5 steps with a railing? : Total 6 Click Score: 15    End of Session Equipment Utilized During Treatment: Other (comment);Oxygen(heart pillow) Activity Tolerance: Patient tolerated treatment well Patient left: in bed;with call bell/phone within reach Nurse Communication: Mobility status PT Visit Diagnosis: Other abnormalities of gait and mobility (R26.89);Muscle weakness (generalized) (M62.81)     Time: 1308-6578 PT Time Calculation (min) (ACUTE ONLY): 27 min  Charges:  $Therapeutic Activity: 8-22 mins                    Ramond Dial 08/11/2019,  5:00 PM  Mee Hives, PT MS Acute Rehab Dept. Number: [place] and [place]

## 2019-08-12 LAB — CBC
HCT: 28.4 % — ABNORMAL LOW (ref 39.0–52.0)
Hemoglobin: 8.9 g/dL — ABNORMAL LOW (ref 13.0–17.0)
MCH: 30 pg (ref 26.0–34.0)
MCHC: 31.3 g/dL (ref 30.0–36.0)
MCV: 95.6 fL (ref 80.0–100.0)
Platelets: 176 10*3/uL (ref 150–400)
RBC: 2.97 MIL/uL — ABNORMAL LOW (ref 4.22–5.81)
RDW: 17.5 % — ABNORMAL HIGH (ref 11.5–15.5)
WBC: 7.7 10*3/uL (ref 4.0–10.5)
nRBC: 0.8 % — ABNORMAL HIGH (ref 0.0–0.2)

## 2019-08-12 LAB — BASIC METABOLIC PANEL
Anion gap: 9 (ref 5–15)
BUN: 14 mg/dL (ref 6–20)
CO2: 35 mmol/L — ABNORMAL HIGH (ref 22–32)
Calcium: 8.6 mg/dL — ABNORMAL LOW (ref 8.9–10.3)
Chloride: 92 mmol/L — ABNORMAL LOW (ref 98–111)
Creatinine, Ser: 0.71 mg/dL (ref 0.61–1.24)
GFR calc Af Amer: >60 mL/min (ref >60)
GFR calc non Af Amer: >60 mL/min (ref >60)
Glucose, Bld: 95 mg/dL (ref 70–99)
Potassium: 3.9 mmol/L (ref 3.5–5.1)
Sodium: 136 mmol/L (ref 135–145)

## 2019-08-12 LAB — GLUCOSE, CAPILLARY
Glucose-Capillary: 98 mg/dL (ref 70–99)
Glucose-Capillary: 131 mg/dL — ABNORMAL HIGH (ref 70–99)
Glucose-Capillary: 166 mg/dL — ABNORMAL HIGH (ref 70–99)
Glucose-Capillary: 129 mg/dL — ABNORMAL HIGH (ref 70–99)

## 2019-08-12 MED ORDER — POTASSIUM CHLORIDE CRYS ER 20 MEQ PO TBCR
40.0000 meq | EXTENDED_RELEASE_TABLET | Freq: Two times a day (BID) | ORAL | Status: AC
  Administered 2019-08-12 (×2): 40 meq via ORAL
  Filled 2019-08-12 (×2): qty 2

## 2019-08-12 MED ORDER — KETOROLAC TROMETHAMINE 15 MG/ML IJ SOLN: 15.0000 mg | Freq: Three times a day (TID) | INTRAMUSCULAR | Status: DC

## 2019-08-12 MED ORDER — METOLAZONE 5 MG PO TABS
2.5000 mg | ORAL_TABLET | Freq: Once | ORAL | Status: AC
  Administered 2019-08-12: 2.5 mg via ORAL
  Filled 2019-08-12: qty 1

## 2019-08-12 MED ORDER — KETOROLAC TROMETHAMINE 15 MG/ML IJ SOLN: 15.0000 mg | Freq: Three times a day (TID) | INTRAMUSCULAR | Status: AC | PRN

## 2019-08-12 MED ORDER — FUROSEMIDE 40 MG PO TABS: 40.0000 mg | ORAL_TABLET | Freq: Every day | ORAL | Status: DC

## 2019-08-12 MED ORDER — FUROSEMIDE 40 MG PO TABS
40.0000 mg | ORAL_TABLET | Freq: Every day | ORAL | Status: DC
  Administered 2019-08-12 – 2019-08-17 (×6): 40 mg via ORAL
  Filled 2019-08-12 (×6): qty 1

## 2019-08-12 MED ORDER — FUROSEMIDE 10 MG/ML IJ SOLN
80.0000 mg | Freq: Two times a day (BID) | INTRAMUSCULAR | Status: DC
  Filled 2019-08-12: qty 8

## 2019-08-12 NOTE — Progress Notes (Addendum)
.       Steve Richards       [place] [address]             [phone]      6 Days Post-Op Procedure(s) (LRB): EXPLORATION POST OPERATIVE OPEN HEART (N/A) Subjective: Feels okay this morning. He is moving around the room okay and appetite is picking up.   Objective: Vital signs in last 24 hours: Temp:  [97.5 F (36.4 C)-98.7 F (37.1 C)] 98.1 F (36.7 C) (06/09 0733) Pulse Rate:  [78-155] 155 (06/09 0733) Cardiac Rhythm: Normal sinus rhythm (06/08 1900) Resp:  [12-20] 18 (06/09 0733) BP: (92-138)/(45-95) 104/59 (06/09 0733) SpO2:  [90 %-96 %] 92 % (06/09 0733) Weight:  [111.2 kg] 111.2 kg (06/09 0500)     Intake/Output from previous day: 06/08 0701 - 06/09 0700 In: -  Out: 5366 [Urine:6875] Intake/Output this shift: No intake/output data recorded.  General appearance: alert, cooperative and no distress Heart: regular rate and rhythm, S1, S2 normal, no murmur, click, rub or gallop Lungs: clear to auscultation bilaterally Abdomen: soft, non-tender; bowel sounds normal; no masses,  no organomegaly Extremities: 2-3+ pitting edema in bilateral lower ext Wound: clean and dry  Lab Results: Recent Labs    08/10/19 0331 08/12/19 0356  WBC 6.7 7.7  HGB 8.5* 8.9*  HCT 26.5* 28.4*  PLT 102* 176   BMET:  Recent Labs    08/10/19 0331 08/12/19 0356  NA 137 136  K 3.9 3.9  CL 99 92*  CO2 29 35*  GLUCOSE 125* 95  BUN 13 14  CREATININE 0.68 0.71  CALCIUM 7.8* 8.6*    PT/INR: No results for input(s): LABPROT, INR in the last 72 hours. ABG    Component Value Date/Time   PHART 7.441 08/07/2019 1103   HCO3 26.5 08/07/2019 1103   TCO2 28 08/07/2019 1103   ACIDBASEDEF 1.0 08/06/2019 2310   O2SAT 73.8 08/10/2019 0331   CBG (last 3)  Recent Labs    08/11/19 1617 08/11/19 2126 08/12/19 0554  GLUCAP 113* 186* 98    Assessment/Plan: S/P Procedure(s) (LRB): EXPLORATION POST OPERATIVE OPEN HEART (N/A)  1. CV- NSR in the 80s, BP well controlled.  Continue asa and carvedilol. Not on a statin. Stop lisinopril.  2. Pulm-tolerating room air. Continue to use incentive spirometer and flutter valve.  3. Endo-blood glucose well controlled 4. Renal-creatinine 0.71, electrolytes okay continue lasix for fluid overload. Negative 6L yesterday.  5. CIR eval per PT. They stated he might not need at the time of discharge 6. He is asking for some toradol for his back/shoulder pain.  Will order a k pad.   Plan: Will remove his EPW. Continue diuresis once a day. Continue work with PT and look for recs from [place].    LOS: 22 days    Steve Richards 08/12/2019   Chart reviewed, patient examined, agree with above. He was - 7L yesterday with IV diuresis in addition to oral lasix and metolazone. Lisinopril also started yesterday. BP 90's this am. Will hold off on Lisinopril for now and continue oral diuresis. His legs are 1/3 the size they were preop but still needs diuresis, just a little slower.

## 2019-08-12 NOTE — Progress Notes (Signed)
Physical Therapy Treatment Patient Details Name: Steve Richards MRN: [id number] DOB: 1965/06/03 Today's Date: 08/12/2019    History of Present Illness 54y.o. male admitted on 07/22/19 for SOB and LE edema.  Dx with acute systolic/diastolic CHF, volume overload, COVID (+), LE dopplers pending to r/o DVT due to elevated D dimer, liver cirrhosis, L LE cellulits .  Pt with significant PMH of  Pleural effusion, cirrhosis of the liver with ascites d/p thoracentesis.  Caridology consulted and also found severe aortic stenosis.  Bil LE dopplers negative for DVT.  Due to have open valve replacement the week of 5/29.  Patient s/p open AVR/TEE 08/06/19.    PT Comments    Pt amb well in room and in hallways. Able to negotiate stairs today. Pt continues to need some elevation of seating surface to come to stand without assistance. Pt requires assist to lie down and sit up on a flat bed. With further discussion found that pt hasn't slept in a bed at home for months prior to coming into the hospital which helps explain his struggle with bed mobility. Working on bed mobility and sit to stand isn't enough to fill the PT time sessions for an intense inpatient rehab program like CIR. Feel from PT standpoint with equipment (lift chair and/or hospital bed) and home adaptations (other elevated seating surfaces) he could return home. If these adaptations or equipment aren't possible then may need to look at ST-SNF.     Follow Up Recommendations  Home health PT(if home adaptations can be made)     Equipment Recommendations  Rolling walker with 5" wheels;Other (comment)(hospital bed and/or lift chair)    Recommendations for Other Services       Precautions / Restrictions Precautions Precautions: Fall;Sternal    Mobility  Bed Mobility Overal bed mobility: Needs Assistance Bed Mobility: Rolling;Sidelying to Sit;Sit to Sidelying Rolling: Modified independent (Device/Increase time) Sidelying to sit: Min assist     Sit  to sidelying: Min assist General bed mobility comments: Assist to elevate trunk into sitting when HOB flat. Assist to bring legs back up into bed returning to sidelying with HOB flat  Transfers Overall transfer level: Needs assistance Equipment used: Rolling walker (2 wheeled);None Transfers: Sit to/from Stand Sit to Stand: Min assist;Modified independent (Device/Increase time)         General transfer comment: From elevated bed able to stand modified independent. From chair required min assist to bring hips up.  Ambulation/Gait Ambulation/Gait assistance: Modified independent (Device/Increase time) Gait Distance (Feet): 350 Feet Assistive device: Rolling walker (2 wheeled);None Gait Pattern/deviations: Step-through pattern;Decreased stride length Gait velocity: decr Gait velocity interpretation: 1.31 - 2.62 ft/sec, indicative of limited community ambulator General Gait Details: Modified independent in room with or without walker. In hall modified independent with rolling walker. Amb on RA with SpO2 89-93%.    Stairs Stairs: Yes Stairs assistance: Min guard Stair Management: One rail Left;Step to pattern;Forwards Number of Stairs: 4 General stair comments: Assist on stairs for safety   Wheelchair Mobility    Modified Rankin (Stroke Patients Only)       Balance Overall balance assessment: Mild deficits observed, not formally tested                                          Cognition Arousal/Alertness: Awake/alert Behavior During Therapy: WFL for tasks assessed/performed Overall Cognitive Status: Within Functional Limits for tasks assessed  Exercises Other Exercises Other Exercises: Instructed pt to work on side to side rotations supine in bed    General Comments        Pertinent Vitals/Pain Pain Assessment: Faces Faces Pain Scale: Hurts a little bit Pain Location: back pain with bed  mobility Pain Descriptors / Indicators: Grimacing Pain Intervention(s): Repositioned;Monitored during session    Home Living                      Prior Function            PT Goals (current goals can now be found in the care plan section) Progress towards PT goals: Progressing toward goals    Frequency    Min 3X/week      PT Plan Discharge plan needs to be updated    Co-evaluation              AM-PAC PT "6 Clicks" Mobility   Outcome Measure  Help needed turning from your back to your side while in a flat bed without using bedrails?: None Help needed moving from lying on your back to sitting on the side of a flat bed without using bedrails?: A Little Help needed moving to and from a bed to a chair (including a wheelchair)?: A Little Help needed standing up from a chair using your arms (e.g., wheelchair or bedside chair)?: A Little Help needed to walk in hospital room?: None Help needed climbing 3-5 steps with a railing? : A Little 6 Click Score: 20    End of Session Equipment Utilized During Treatment: Gait belt(heart pillow) Activity Tolerance: Patient tolerated treatment well Patient left: Other (comment)(standing in room)   PT Visit Diagnosis: Other abnormalities of gait and mobility (R26.89);Muscle weakness (generalized) (M62.81)     Time: 8280-0349 PT Time Calculation (min) (ACUTE ONLY): 30 min  Charges:  $Gait Training: 8-22 mins $Therapeutic Activity: 8-22 mins                     [place] Pager [phone] Office [place] 08/12/2019, 1:29 PM

## 2019-08-12 NOTE — Progress Notes (Addendum)
Pt walked on 1L O2 w/ walker, standby assist, approx 970 ft. Tolerated well, assisted to bed.

## 2019-08-12 NOTE — Progress Notes (Signed)
EPW removed per order. Tips intact. VSS. Pt tolerated well. Pt educated on 1 hour bed rest. Call light in reach.  Clyde Canterbury, RN

## 2019-08-12 NOTE — Progress Notes (Signed)
Inpatient Rehab Admissions Coordinator:   Met with patient at bedside to review updated therapy recommendations for home health with appropriate equipment.  Will let TOC team know and sign off for CIR at this time.   Shann Medal, PT, DPT Admissions Coordinator [phone] 08/12/19  2:41 PM

## 2019-08-12 NOTE — Progress Notes (Signed)
Mobility Specialist: Progress Note    08/12/19 1520  Mobility  Activity Ambulated in hall  Level of Assistance Modified independent, requires aide device or extra time  Assistive Device Front wheel walker  Distance Ambulated (ft) 1920 ft  Mobility Response Tolerated well  Mobility performed by Mobility specialist  $Mobility charge 1 Mobility   Pre-Mobility: 80 HR, 115/66 BP, 94% SpO2 Post-Mobility: 83 HR, 122/69 BP, 98% SpO2  Pt tolerated ambulation well. Pt started off w/o O2. We made it from one end of the hallway to the other and pt's sats dropped to 88. I put pt on 1L/min of O2 and sats returned to the low 90s. Pt did another lap in the hallway and sats remained in the mid 90s and returned to 98% after returning to the room.   [facility] Lorina Duffner Mobility Specialist

## 2019-08-12 NOTE — Progress Notes (Signed)
Progress Note  Patient Name: Jarrin Staley Date of Encounter: 08/12/2019  [facility] Cardiologist: Candee Furbish, MD   Subjective   Patient put out 6.8Lurine overnight. Pressures a little soft. He has some chest wall soreness. Overall still feels fatigued and sob during PT but slowly improving. Still significantly volume up.   Inpatient Medications    Scheduled Meds:  aspirin EC  325 mg Oral Daily   carvedilol  3.125 mg Oral BID WC   chlorhexidine  15 mL Mouth/Throat BID   Chlorhexidine Gluconate Cloth  6 each Topical Daily   [place] [facility], Patient & Family Education   Does not apply Once   docusate sodium  200 mg Oral Daily   enoxaparin (LOVENOX) injection  40 mg Subcutaneous Q24H   feeding supplement (ENSURE ENLIVE)  237 mL Oral BID BM   feeding supplement (PRO-STAT SUGAR FREE 64)  30 mL Oral Daily   ferrous [id number] C-folic acid  1 capsule Oral TID PC   furosemide  40 mg Oral Daily   insulin aspart  0-24 Units Subcutaneous TID AC & HS   insulin detemir  10 Units Subcutaneous Daily   levothyroxine  25 mcg Oral Q0600   metolazone  2.5 mg Oral Once   multivitamin with minerals  1 tablet Oral Daily   pantoprazole  40 mg Oral QAC breakfast   potassium chloride  40 mEq Oral BID   sodium chloride flush  10-40 mL Intracatheter Q12H   sodium chloride flush  3 mL Intravenous Q12H   Continuous Infusions:  sodium chloride     PRN Meds: sodium chloride, acetaminophen, bisacodyl **OR** bisacodyl, ondansetron **OR** ondansetron (ZOFRAN) IV, oxyCODONE, sodium chloride flush, sodium chloride flush, traMADol   Vital Signs    Vitals:   08/11/19 2320 08/12/19 0342 08/12/19 0500 08/12/19 0733  BP: (!) 109/51 (!) 92/45  (!) 104/59  Pulse: 78 81  (!) 155  Resp: 18 20 20 18   Temp: 97.8 F (36.6 C) (!) 97.5 F (36.4 C)  98.1 F (36.7 C)  TempSrc: Oral Oral  Oral  SpO2: 96% 90%  92%  Weight: 111.2 kg  111.2 kg   Height:         Intake/Output Summary (Last 24 hours) at 08/12/2019 0820 Last data filed at 08/12/2019 0643 Gross per 24 hour  Intake --  Output 6875 ml  Net -6875 ml   Last 3 Weights 08/12/2019 08/11/2019 08/11/2019  Weight (lbs) 245 lb 2.4 oz 245 lb 2.4 oz 257 lb 0.9 oz  Weight (kg) 111.2 kg 111.2 kg 116.6 kg      Telemetry    NSR, HR 70-80 - Personally Reviewed  ECG    No new - Personally Reviewed  Physical Exam   GEN: No acute distress.   Neck: No JVD Cardiac: RRR, no murmurs, rubs, or gallops.  Respiratory: Clear to auscultation bilaterally. GI: Soft, nontender, non-distended  MS: 3+ edema; No deformity. Neuro:  Nonfocal  Psych: Normal affect   Labs    High Sensitivity Troponin:   Recent Labs  Lab 07/21/19 0820 07/21/19 1237  TROPONINIHS 47* 40*      Chemistry Recent Labs  Lab 08/06/19 0226 08/06/19 0719 08/09/19 0336 08/10/19 0331 08/12/19 0356  NA 137   < > 138 137 136  K 4.3   < > 3.5 3.9 3.9  CL 97*   < > 100 99 92*  CO2 27   < > 29 29 35*  GLUCOSE 121*   < >  90 125* 95  BUN 22*   < > 11 13 14   CREATININE 0.98   < > 0.63 0.68 0.71  CALCIUM 8.8*   < > 7.7* 7.8* 8.6*  PROT 7.0  --   --   --   --   ALBUMIN 3.3*  --   --   --   --   AST 28  --   --   --   --   ALT 20  --   --   --   --   ALKPHOS 250*  --   --   --   --   BILITOT 2.8*  --   --   --   --   GFRNONAA >60   < > >60 >60 >60  GFRAA >60   < > >60 >60 >60  ANIONGAP 13   < > 9 9 9    < > = values in this interval not displayed.     Hematology Recent Labs  Lab 08/09/19 0336 08/09/19 0336 08/09/19 1017 08/10/19 0331 08/12/19 0356  WBC 8.8  --   --  6.7 7.7  RBC 2.51*  --   --  2.74* 2.97*  HGB 7.5*   < > 8.5* 8.5* 8.9*  HCT 24.2*   < > 27.3* 26.5* 28.4*  MCV 96.4  --   --  96.7 95.6  MCH 29.9  --   --  31.0 30.0  MCHC 31.0  --   --  32.1 31.3  RDW 18.1*  --   --  17.6* 17.5*  PLT 84*  --   --  102* 176   < > = values in this interval not displayed.    BNPNo results for input(s): BNP,  PROBNP in the last 168 hours.   DDimer No results for input(s): DDIMER in the last 168 hours.   Radiology    No results found.  Cardiac Studies   Intraoperative TEE 9/0/93 Complications: No known complications during this procedure.  POST-OP IMPRESSIONS  - Left Ventricle: The left ventricle is unchanged from pre-bypass.  - Aorta: The aorta appears unchanged from pre-bypass.  - Left Atrial Appendage: The left atrial appendage appears unchanged from  pre-bypass.  - Aortic Valve: A bioprosthetic bioprosthetic valve was placed, leaflets  thin  and leaflets are not freely mobile Size; 70m. No regurgitation post  repair. the  gradient recorded across the prosthetic valve is above the expected range,  measuring 17 cm/s. No perivalvular leak noted.  - Mitral Valve: The mitral valve appears unchanged from pre-bypass.  - Tricuspid Valve: The tricuspid valve appears unchanged from pre-bypass.  - Interatrial Septum: The interatrial septum appears unchanged from  pre-bypass.   Cardiac cath 07/28/19  There is severe aortic valve stenosis.   1.  Patent coronary arteries with no obstructive CAD 2.  Severe calcification and restriction of the aortic valve leaflets with hemodynamic evidence of critical aortic stenosis.  Mean aortic gradient 57 mmHg, peak gradient 108 mmHg, calculated aortic valve area 0.6 cm 3.  Elevated right and left heart pressures consistent with congestive heart failure  Recommendations: Continued heart team evaluation for treatment options of critical aortic stenosis.  Continue IV diuresis as tolerated.  We will ask for inpatient cardiac surgical evaluation to help determine treatment plan.  Patient Profile     54y.o. male with pmh of cirrhosis admitted 5/18 for covid and found to have critical AS and HFrEF.  Assessment & Plan    Critical AS/Acute  systolic HF, EF 37-29% - s/p SAVR prosthetic AoV 6/3 and re-do sternotomy due to pericardial bleeding 6/3 - lasix  changed to 40 mg BID  - continue coreg 3.132m BID - Patient put out 6.8L urine, net -28L - weight 310>245lbs - kidneys stable.  - continue with diuresis. Pressures have been soft, continue to monitor   For questions or updates, please contact [place] consult [email or web address] for contact info under        Signed, Annsley Akkerman HNinfa Meeker PA-C  08/12/2019, 8:20 AM

## 2019-08-13 LAB — BASIC METABOLIC PANEL
Anion gap: 10 (ref 5–15)
BUN: 16 mg/dL (ref 6–20)
CO2: 32 mmol/L (ref 22–32)
Calcium: 8.8 mg/dL — ABNORMAL LOW (ref 8.9–10.3)
Chloride: 93 mmol/L — ABNORMAL LOW (ref 98–111)
Creatinine, Ser: 0.77 mg/dL (ref 0.61–1.24)
GFR calc Af Amer: >60 mL/min (ref >60)
GFR calc non Af Amer: >60 mL/min (ref >60)
Glucose, Bld: 115 mg/dL — ABNORMAL HIGH (ref 70–99)
Potassium: 4.1 mmol/L (ref 3.5–5.1)
Sodium: 135 mmol/L (ref 135–145)

## 2019-08-13 LAB — GLUCOSE, CAPILLARY
Glucose-Capillary: 115 mg/dL — ABNORMAL HIGH (ref 70–99)
Glucose-Capillary: 154 mg/dL — ABNORMAL HIGH (ref 70–99)
Glucose-Capillary: 166 mg/dL — ABNORMAL HIGH (ref 70–99)
Glucose-Capillary: 125 mg/dL — ABNORMAL HIGH (ref 70–99)

## 2019-08-13 MED ORDER — METOLAZONE 5 MG PO TABS
2.5000 mg | ORAL_TABLET | Freq: Once | ORAL | Status: AC
  Administered 2019-08-13: 2.5 mg via ORAL
  Filled 2019-08-13: qty 1

## 2019-08-13 MED ORDER — METFORMIN HCL 500 MG PO TABS
500.0000 mg | ORAL_TABLET | Freq: Two times a day (BID) | ORAL | Status: DC
  Administered 2019-08-13 – 2019-08-17 (×9): 500 mg via ORAL
  Filled 2019-08-13 (×9): qty 1

## 2019-08-13 MED ORDER — POTASSIUM CHLORIDE CRYS ER 20 MEQ PO TBCR
40.0000 meq | EXTENDED_RELEASE_TABLET | Freq: Two times a day (BID) | ORAL | Status: AC
  Administered 2019-08-13 (×2): 40 meq via ORAL
  Filled 2019-08-13 (×2): qty 2

## 2019-08-13 NOTE — Progress Notes (Signed)
Cardiology Progress Note  Patient ID: Steve Richards MRN: [id number] DOB: Oct 24, 1965 Date of Encounter: 08/13/2019  Primary Cardiologist: Candee Furbish, MD  Subjective   Chief Complaint: Lower extremity edema  HPI: Good urine output with p.o. Lasix. Lower extreme edema improving. No complaints this morning.  ROS:  All other ROS reviewed and negative. Pertinent positives noted in the HPI.     Inpatient Medications  Scheduled Meds: . aspirin EC  325 mg Oral Daily  . carvedilol  3.125 mg Oral BID WC  . Chlorhexidine Gluconate Cloth  6 each Topical Daily  . [place] [facility], Patient & Family Education   Does not apply Once  . docusate sodium  200 mg Oral Daily  . enoxaparin (LOVENOX) injection  40 mg Subcutaneous Q24H  . feeding supplement (ENSURE ENLIVE)  237 mL Oral BID BM  . feeding supplement (PRO-STAT SUGAR FREE 64)  30 mL Oral Daily  . ferrous [id number] C-folic acid  1 capsule Oral TID PC  . furosemide  40 mg Oral Daily  . insulin aspart  0-24 Units Subcutaneous TID AC & HS  . levothyroxine  25 mcg Oral Q0600  . metFORMIN  500 mg Oral BID WC  . multivitamin with minerals  1 tablet Oral Daily  . pantoprazole  40 mg Oral QAC breakfast  . potassium chloride  40 mEq Oral BID  . sodium chloride flush  10-40 mL Intracatheter Q12H  . sodium chloride flush  3 mL Intravenous Q12H   Continuous Infusions: . sodium chloride     PRN Meds: sodium chloride, acetaminophen, bisacodyl **OR** bisacodyl, ketorolac, ondansetron **OR** ondansetron (ZOFRAN) IV, oxyCODONE, sodium chloride flush, sodium chloride flush, traMADol   Vital Signs   Vitals:   08/13/19 0052 08/13/19 0200 08/13/19 0337 08/13/19 0813  BP: 108/69  (!) 117/57 114/60  Pulse:   81   Resp: 16  18 19   Temp: 98 F (36.7 C)  98.2 F (36.8 C) 97.8 F (36.6 C)  TempSrc: Oral  Oral Oral  SpO2: 94%  96% 95%  Weight:  107.2 kg    Height:        Intake/Output Summary (Last 24 hours) at 08/13/2019  1055 Last data filed at 08/13/2019 0719 Gross per 24 hour  Intake --  Output 4275 ml  Net -4275 ml   Last 3 Weights 08/13/2019 08/12/2019 08/11/2019  Weight (lbs) 236 lb 4.8 oz 245 lb 2.4 oz 245 lb 2.4 oz  Weight (kg) 107.185 kg 111.2 kg 111.2 kg      Telemetry  Overnight telemetry shows sinus rhythm in the 60s, which I personally reviewed.   Physical Exam   Vitals:   08/13/19 0052 08/13/19 0200 08/13/19 0337 08/13/19 0813  BP: 108/69  (!) 117/57 114/60  Pulse:   81   Resp: 16  18 19   Temp: 98 F (36.7 C)  98.2 F (36.8 C) 97.8 F (36.6 C)  TempSrc: Oral  Oral Oral  SpO2: 94%  96% 95%  Weight:  107.2 kg    Height:         Intake/Output Summary (Last 24 hours) at 08/13/2019 1055 Last data filed at 08/13/2019 0719 Gross per 24 hour  Intake --  Output 4275 ml  Net -4275 ml    Last 3 Weights 08/13/2019 08/12/2019 08/11/2019  Weight (lbs) 236 lb 4.8 oz 245 lb 2.4 oz 245 lb 2.4 oz  Weight (kg) 107.185 kg 111.2 kg 111.2 kg    Body mass index is 32.05 kg/m.  General: Well nourished, well developed, in no acute distress Head: Atraumatic, normal size  Eyes: PEERLA, EOMI  Neck: Supple, no JVD Endocrine: No thryomegaly Cardiac: Normal S1, S2; RRR; no murmurs, rubs, or gallops Lungs: Clear to auscultation bilaterally, no wheezing, rhonchi or rales  Abd: Soft, nontender, no hepatomegaly  Ext: 1+ pitting edema Musculoskeletal: No deformities, BUE and BLE strength normal and equal Skin: Midline sternotomy scar well-healed Neuro: Alert and oriented to person, place, time, and situation, CNII-XII grossly intact, no focal deficits  Psych: Normal mood and affect   Labs  High Sensitivity Troponin:   Recent Labs  Lab 07/21/19 0820 07/21/19 1237  TROPONINIHS 47* 40*     Cardiac EnzymesNo results for input(s): TROPONINI in the last 168 hours. No results for input(s): TROPIPOC in the last 168 hours.  Chemistry Recent Labs  Lab 08/10/19 0331 08/12/19 0356 08/13/19 0606  NA 137 136  135  K 3.9 3.9 4.1  CL 99 92* 93*  CO2 29 35* 32  GLUCOSE 125* 95 115*  BUN 13 14 16   CREATININE 0.68 0.71 0.77  CALCIUM 7.8* 8.6* 8.8*  GFRNONAA >60 >60 >60  GFRAA >60 >60 >60  ANIONGAP 9 9 10     Hematology Recent Labs  Lab 08/09/19 0336 08/09/19 0336 08/09/19 1017 08/10/19 0331 08/12/19 0356  WBC 8.8  --   --  6.7 7.7  RBC 2.51*  --   --  2.74* 2.97*  HGB 7.5*   < > 8.5* 8.5* 8.9*  HCT 24.2*   < > 27.3* 26.5* 28.4*  MCV 96.4  --   --  96.7 95.6  MCH 29.9  --   --  31.0 30.0  MCHC 31.0  --   --  32.1 31.3  RDW 18.1*  --   --  17.6* 17.5*  PLT 84*  --   --  102* 176   < > = values in this interval not displayed.   BNPNo results for input(s): BNP, PROBNP in the last 168 hours.  DDimer No results for input(s): DDIMER in the last 168 hours.   Radiology  No results found.  Cardiac Studies  TTE 07/21/2019 1. Left ventricular ejection fraction, by estimation, is 40 to 45%. The  left ventricle has mildly decreased function. The left ventricle  demonstrates global hypokinesis. There is moderate left ventricular  hypertrophy. Left ventricular diastolic  parameters are consistent with Grade II diastolic dysfunction  (pseudonormalization).  2. Right ventricular systolic function is mildly reduced. The right  ventricular size is normal. There is moderately elevated pulmonary artery  systolic pressure.  3. Left atrial size was moderately dilated.  4. The mitral valve is abnormal. Trivial mitral valve regurgitation.  5. Cannot exclude a bicuspid valve. Aortic valve regurgitation is mild.  Critical aortic valve stenosis. Aortic valve area, by VTI measures 0.73  cm. Aortic valve mean gradient measures 96.2 mmHg. Peak gradient of 137  mmHg. Aortic valve Vmax measures  5.86 m/s.  6. The inferior vena cava is dilated in size with <50% respiratory  variability, suggesting right atrial pressure of 15 mmHg.  7. Moderate pleural effusion in the right lateral region.    Patient Profile  Steve Richards is a 54 y.o. male with cirrhosis admitted 5/18 for covid and found to have critical AS and HFrEF.  Status post surgical aortic valve replacement.  Assessment & Plan   1. Critical aortic stenosis/systolic heart failure/volume overload -Doing well. Status post SAVR -Continue Coreg. Continue p.o. Lasix. Would recommend to  add p.o. lisinopril as able. -We will sign off at this time. Once his medical regimen is optimized he can arrange follow-up once he is closer to discharge with Dr. Candee Furbish. He will need a follow-up echocardiogram and we can arrange this at discharge.  [facility] will sign off.   Medication Recommendations: Coreg 3.125 mg twice daily, Lasix 40 mg p.o., lisinopril added when able per CT surgery Other recommendations (labs, testing, etc): None Follow up as an outpatient: We will arrange a follow-up appointment for Mr. Shoultz once he is closer to discharge. Please reach back out to our service once he is closer to discharge we will arrange his appointments. If he goes to rehab we can make appointments at their discretion as well.  Time Spent with Patient: I have spent a total of 25 minutes with patient reviewing hospital notes, telemetry, EKGs, labs and examining the patient as well as establishing an assessment and plan that was discussed with the patient.  > 50% of time was spent in direct patient care.    Signed, Addison Naegeli. Audie Box, [place]  08/13/2019 10:55 AM

## 2019-08-13 NOTE — TOC Initial Note (Signed)
Transition of Care [facility]) - Initial/Assessment Note    Patient Details  Name: Steve Richards MRN: [id number] Date of Birth: [birth year]  Transition [facility]) CM/SW Contact:    Verdell Carmine, RN Phone Number: 08/13/2019, 4:26 PM  Clinical Narrative:                 Getting closer to discharge wants rehabilitation or SNF  but ambulating over 500 feet, moving in room by self without adaptive equipment, likely will not qualify for Inpatient rehabilitation or SNF. Recommendations of home with home health but with supervision.  Consult to [facility] Remote servicesNatividad Brood)  to check on patient daily,  As well as obtaining medic alert. This would give that extra layer of oversight including home health visits to make up for potential  lack of supervision at times.  Will wait to hear back from [place].   Expected Discharge Plan: [place] Barriers to Discharge: Continued Medical Work up   Patient Goals and CMS Choice Patient states their goals for this hospitalization and ongoing recovery are:: Rehabilitation      Expected Discharge Plan and Services Expected Discharge Plan: [place] In-house Referral: Clinical Social Work                                            Prior Living Arrangements/Services   Lives with:: Self Patient language and need for interpreter reviewed:: Yes Do you feel safe going back to the place where you live?:  (worried about not having someone around all the time)      Need for Family Participation in Patient Care: Yes (Comment) Care giver support system in place?: No (comment)   Criminal Activity/Legal Involvement Pertinent to Current Situation/Hospitalization: No - Comment as needed  Activities of Daily Living Home Assistive Devices/Equipment: None ADL Screening (condition at time of admission) Patient's cognitive ability adequate to safely complete daily activities?: Yes Is the patient deaf or have  difficulty hearing?: No Does the patient have difficulty seeing, even when wearing glasses/contacts?: No Does the patient have difficulty concentrating, remembering, or making decisions?: No Patient able to express need for assistance with ADLs?: Yes Does the patient have difficulty dressing or bathing?: Yes Independently performs ADLs?: Yes (appropriate for developmental age) Does the patient have difficulty walking or climbing stairs?: Yes Weakness of Legs: Both Weakness of Arms/Hands: Both  Permission Sought/Granted                  Emotional Assessment       Orientation: : Oriented to Place, Oriented to  Time, Oriented to Situation   Psych Involvement: No (comment)  Admission diagnosis:  Left leg cellulitis [X45.038] New onset of congestive heart failure (HCC) [I50.9] Acute congestive heart failure, unspecified heart failure type ([place]) [I50.9] Aortic valve stenosis [I35.0] S/P AVR [Z95.2] Patient Active Problem List   Diagnosis Date Noted  . S/P AVR 08/07/2019  . 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 Aortic valve stenosis 08/06/2019  . Severe aortic stenosis   . New onset of congestive heart failure ([place]) 07/21/2019  . Cellulitis and abscess of left lower extremity 07/21/2019  . Hyperglycemia 07/21/2019  . Obesity, Class III, BMI 40-49.9 (morbid obesity) ([place]) 07/21/2019  . Ascites 07/15/2019  . Hepatic cirrhosis ([place]) 07/15/2019  . Pleural effusion on right 07/15/2019  .  Left kidney mass 07/15/2019  . Splenomegaly 07/15/2019  . Systolic ejection murmur 77/93/9030   PCP:  System, Pcp Not In Pharmacy:   [place], [place] - [address] [place] [address] Phone: [phone] Fax: [phone]     Social Determinants of Health (SDOH) Interventions    Readmission Risk Interventions No flowsheet data found.

## 2019-08-13 NOTE — Progress Notes (Signed)
Nutrition Follow-up  DOCUMENTATION CODES:   Morbid obesity  INTERVENTION:    Ensure Enlive PO BID, each supplement provides 350 kcal and 20 grams of protein.  Prostat 30 ml poonce daily, each supplement provides 100 kcal and 15 grams of protein.  MVI with minerals daily.  NUTRITION DIAGNOSIS:   Increased nutrient needs related to chronic illness (cirrhosis) as evidenced by estimated needs   Ongoing  GOAL:   Patient will meet greater than or equal to 90% of their needs   Progressing per patient   MONITOR:   PO intake, Supplement acceptance, Skin, Weight trends, Labs, I & O's  REASON FOR ASSESSMENT:   Consult Assessment of nutrition requirement/status  ASSESSMENT:   54 y.o. male with medical history significant of R pleural effusion in April 2021, cirrhosis with ascites s/p thoracentesis presenting with SOB and LE edema. COVID positive. Pt with acute systolic/diastolic CHF.   5/25- s/p LHC 6/3 s/p AVR, re-exploration for post-op bleeding, ligation of sternal bleeding site 6/4- extubated   Volume status improved. Appetite progressing. Meal completions lack documentation but pt reports finishing a majority of meals. Drinking both Ensure and Prostat BID. Discussed the importance of continued protein intake to promote post-op healing.   Admission weight: 128.5 kg  Current weight: 107.2 kg- continues to diurese  Medications: colace, trinsicon, SS novolog, metformin, MVI with minerals Labs: CBG 98-166  Diet Order:   Diet Order            Diet heart healthy/carb modified Room service appropriate? Yes; Fluid consistency: Thin  Diet effective now                 EDUCATION NEEDS:   Not appropriate for education at this time  Skin:  Skin Assessment: Skin Integrity Issues: Skin Integrity Issues:: Incisions Incisions: chest Other: Left lower extremity cellulitis  Last BM:  6/9  Height:   Ht Readings from Last 1 Encounters:  08/06/19 6' (1.829 m)     Weight:   Wt Readings from Last 1 Encounters:  08/13/19 107.2 kg    BMI:  Body mass index is 32.05 kg/m.  Estimated Nutritional Needs:   Kcal:  2000-2200  Protein:  120-130 grams  Fluid:  1.5 L/day  Mariana Single RD, LDN Clinical Nutrition Pager listed in [place]

## 2019-08-13 NOTE — Care Management (Addendum)
If CIR or SNF does not approve, then next step is Home with [facility] -Choices for [facility] given and placed in shadow chart. Did go over that some insurances contract with different [place] agencies. Patient inquired about renting hospital bed for a couple of months. Only has a matress on the floor at this time. Needs a 3:1 for low toilet seat and shower chair. Discussed [facility] remote,patient feels this is a very good idea. He feels much better knowing this could be in place.

## 2019-08-13 NOTE — Progress Notes (Signed)
7 Days Post-Op Procedure(s) (LRB): EXPLORATION POST OPERATIVE OPEN HEART (N/A) Subjective: No complaints. Feels better every day.  Objective: Vital signs in last 24 hours: Temp:  [97.6 F (36.4 C)-98.2 F (36.8 C)] 98.2 F (36.8 C) (06/10 0337) Pulse Rate:  [81-93] 81 (06/10 0337) Cardiac Rhythm: Normal sinus rhythm (06/09 1900) Resp:  [16-20] 18 (06/10 0337) BP: (98-117)/(56-72) 117/57 (06/10 0337) SpO2:  [93 %-96 %] 96 % (06/10 0337) Weight:  [107.2 kg] 107.2 kg (06/10 0200)  Hemodynamic parameters for last 24 hours:    Intake/Output from previous day: 06/09 0701 - 06/10 0700 In: -  Out: 8242 [Urine:4225] Intake/Output this shift: Total I/O In: -  Out: 450 [Urine:450]  General appearance: alert and cooperative Neurologic: intact Heart: regular rate and rhythm, S1, S2 normal, no murmur, click, rub or gallop Lungs: clear to auscultation bilaterally Extremities: edema in lower legs and feet is still moderate. Wound: incision healing well.  Lab Results: Recent Labs    08/12/19 0356  WBC 7.7  HGB 8.9*  HCT 28.4*  PLT 176   BMET:  Recent Labs    08/12/19 0356 08/13/19 0606  NA 136 135  K 3.9 4.1  CL 92* 93*  CO2 35* 32  GLUCOSE 95 115*  BUN 14 16  CREATININE 0.71 0.77  CALCIUM 8.6* 8.8*    PT/INR: No results for input(s): LABPROT, INR in the last 72 hours. ABG    Component Value Date/Time   PHART 7.441 08/07/2019 1103   HCO3 26.5 08/07/2019 1103   TCO2 28 08/07/2019 1103   ACIDBASEDEF 1.0 08/06/2019 2310   O2SAT 73.8 08/10/2019 0331   CBG (last 3)  Recent Labs    08/12/19 1652 08/12/19 2054 08/13/19 0617  GLUCAP 166* 129* 115*    Assessment/Plan:  POD 7 He is hemodynamically stable with SBP 100-110 most of the time. Will continue low dose Coreg and plan to start low dose lisinopril after diuresis complete.   Volume excess improving daily with good diuresis. -4L yesterday. Wt down to 236 from 310 on admission. Will continue lasix 40 po  and give another dose of metolazone today. Continue K+ replacement with mild contraction alkalosis.  Hypothyroidism: new diagnosis since admission. Started on Synthroid 25 mcg and will need TSH followup in 4-6 weeks for dose adjustment.  Diabetes: Hgb A1c was 6.1 on admission on no meds. Glucose has been under reasonable control on levemir 10 and SSI but was up to 166 yesterday afternoon. Will start low dose Metformin and stop levemir to see where glucose runs with that.   Continue IS, ambulation. PT does not think he needs CIR so will get social work consult for SNF placement. He lives alone and has some friends but no one to stay with him. Hopefully will be ready to go in a few days. I want to get him diuresed further before discharge given his preop biventricular failure and massive volume overload.   LOS: 23 days    Gaye Pollack 08/13/2019

## 2019-08-13 NOTE — Progress Notes (Signed)
Mobility Specialist: Progress Note    08/13/19 1449  Mobility  Activity Ambulated in hall  Level of Assistance Modified independent, requires aide device or extra time  Assistive Device Front wheel walker  Distance Ambulated (ft) 510 ft  Mobility Response Tolerated well  Mobility performed by Mobility specialist  Bed Position Chair  $Mobility charge 1 Mobility   Pre-Mobility: 93 HR, 94% SpO2 Post-Mobility: 95 HR, 117/78 BP, 90% SpO2  Pt tolerated ambulation well. Sats remained in the 90s during ambulation w/o O2 today.   [facility] Almadelia Looman Mobility Specialist

## 2019-08-13 NOTE — Progress Notes (Signed)
CARDIAC REHAB PHASE I   PRE:  Rate/Rhythm: 77 SR  BP:  Sitting: 107/63      SaO2: 95 RA  MODE:  Ambulation: 470 ft   POST:  Rate/Rhythm: 95 SR  BP:  Sitting: 123/87    SaO2: 96 RA  Pt mod assist to sit from lying position, and mod assist from sit to stand. Pt demonstrates lack of core strength. Encouraged continued practice with transitions. Pt then ambulated 441f in hallway independently with front wheel walker. Pt took one standing rest break c/o SOB, sats maintained throughout the walk. Coached through purse lipped breathing. Pt returned to recliner to speak with dietician. Encouraged continued walks and IS use. Will continue to follow.  [id number] Falco RN BSN 08/13/2019 12:06 PM

## 2019-08-14 ENCOUNTER — Ambulatory Visit: Payer: 59 | Admitting: Family Medicine

## 2019-08-14 LAB — GLUCOSE, CAPILLARY
Glucose-Capillary: 106 mg/dL — ABNORMAL HIGH (ref 70–99)
Glucose-Capillary: 146 mg/dL — ABNORMAL HIGH (ref 70–99)
Glucose-Capillary: 131 mg/dL — ABNORMAL HIGH (ref 70–99)
Glucose-Capillary: 204 mg/dL — ABNORMAL HIGH (ref 70–99)

## 2019-08-14 MED ORDER — POTASSIUM CHLORIDE CRYS ER 20 MEQ PO TBCR
40.0000 meq | EXTENDED_RELEASE_TABLET | Freq: Two times a day (BID) | ORAL | Status: AC
  Administered 2019-08-14 (×2): 40 meq via ORAL
  Filled 2019-08-14 (×2): qty 2

## 2019-08-14 MED ORDER — METOLAZONE 5 MG PO TABS
2.5000 mg | ORAL_TABLET | Freq: Once | ORAL | Status: AC
  Administered 2019-08-14: 2.5 mg via ORAL
  Filled 2019-08-14: qty 1

## 2019-08-14 NOTE — Progress Notes (Signed)
Mobility Specialist - Progress Note   08/14/19 1505  Mobility  Activity Ambulated in hall  Level of Assistance Independent  Assistive Device None  Distance Ambulated (ft) 1540 ft (standing rest break at the end of the hall x3)  Mobility Response Tolerated well  Mobility performed by Mobility specialist  $Mobility charge 1 Mobility    Pre-mobility: 93 HR, 93% SpO2 During mobility: 92-103 HR, 92-94%SpO2 Post-mobility: 92 HR, 93% SpO2  Pt required standing rest breaks to catch his breath. He walked independently w/o a RW and on RA. I carried the RW in case it was needed.   [place] Specialist

## 2019-08-14 NOTE — Progress Notes (Signed)
CARDIAC REHAB PHASE I   Pt recently ambulated with PT. Looks good ambulating in hallway. D/c education completed with pt. Pt educated on importance of site care and monitoring incision daily. Encouraged continued IS use, walks, and sternal precautions. Pt given in-the-tube sheet along with heart healthy and diabetic diets. Stressed importance of daily weights, pt given HF booklet. Encouraged continued ambulation throughout the weekend, PT and OT to make recommendations for home equipment.  [id number] Rufina Falco, RN BSN 08/14/2019 10:42 AM

## 2019-08-14 NOTE — Progress Notes (Addendum)
      [address]       [place] [address]             [phone]      8 Days Post-Op Procedure(s) (LRB): EXPLORATION POST OPERATIVE OPEN HEART (N/A) Subjective: Feels okay this morning.   Objective: Vital signs in last 24 hours: Temp:  [97.2 F (36.2 C)-98.4 F (36.9 C)] 98.4 F (36.9 C) (06/10 2344) Pulse Rate:  [77-87] 82 (06/10 2344) Cardiac Rhythm: Normal sinus rhythm (06/10 2010) Resp:  [17-22] 22 (06/11 0533) BP: (101-114)/(56-65) 107/64 (06/10 2344) SpO2:  [93 %-96 %] 93 % (06/11 0504) Weight:  [103.1 kg] 103.1 kg (06/11 0533)     Intake/Output from previous day: 06/10 0701 - 06/11 0700 In: 240 [P.O.:240] Out: 5400 [Urine:5400] Intake/Output this shift: No intake/output data recorded.  General appearance: alert, cooperative and no distress Heart: regular rate and rhythm, S1, S2 normal, no murmur, click, rub or gallop Lungs: clear to auscultation bilaterally Abdomen: soft, non-tender; bowel sounds normal; no masses,  no organomegaly Extremities: 2+ pitting edema in lower ext Wound: clean and dry  Lab Results: Recent Labs    08/12/19 0356  WBC 7.7  HGB 8.9*  HCT 28.4*  PLT 176   BMET:  Recent Labs    08/12/19 0356 08/13/19 0606  NA 136 135  K 3.9 4.1  CL 92* 93*  CO2 35* 32  GLUCOSE 95 115*  BUN 14 16  CREATININE 0.71 0.77  CALCIUM 8.6* 8.8*    PT/INR: No results for input(s): LABPROT, INR in the last 72 hours. ABG    Component Value Date/Time   PHART 7.441 08/07/2019 1103   HCO3 26.5 08/07/2019 1103   TCO2 28 08/07/2019 1103   ACIDBASEDEF 1.0 08/06/2019 2310   O2SAT 73.8 08/10/2019 0331   CBG (last 3)  Recent Labs    08/13/19 1640 08/13/19 2120 08/14/19 0609  GLUCAP 166* 125* 106*    Assessment/Plan: S/P Procedure(s) (LRB): EXPLORATION POST OPERATIVE OPEN HEART (N/A)  1. CV-BP well controlled. NSR in the 80s. Continue low-dose Coreg.  2. Volume overload-negative 5L yesterday. Continue lasix and  metolazone 3. Renal- creatinine 0.77, electrolytes okay 4. H and H 8.9/28.4, expected acute blood loss anemia 5. DM-glucose well controlled. Started on metformin and has SSI   Plan: Possibly discharge Monday home with home health and home PT/OT. Close follow-up next week in the office. Continue one more dose of metalozone.     LOS: 24 days    Steve Richards 08/14/2019   Chart reviewed, patient examined, agree with above. He continues to diurese well with lasix and metolazone. -4L yesterday and wt down to 227. He still has edema in legs and I would continue diuresing as long as BP allows and BUN/creat stable. Will repeat BMET tomorrow. He will need to get on low dose ACE I but I would wait until fully diuresed to avoid drop in BP. Plan home Monday.

## 2019-08-14 NOTE — Progress Notes (Signed)
Pt demonstrated ability to dress and perform simulated tub transfer modified independently adhering to sternal precautions. Also educated in energy conservation strategies for when he returns home and IADL to avoid in order to adhere to sternal precautions. Updated d/c disposition.    08/14/19 1400  OT Visit Information  Last OT Received On 08/14/19  Assistance Needed +1  History of Present Illness 54y.o. male admitted on 07/22/19 for SOB and LE edema.  Dx with acute systolic/diastolic CHF, volume overload, COVID (+), LE dopplers pending to r/o DVT due to elevated D dimer, liver cirrhosis, L LE cellulits .  Pt with significant PMH of  Pleural effusion, cirrhosis of the liver with ascites d/p thoracentesis.  Caridology consulted and also found severe aortic stenosis.  Bil LE dopplers negative for DVT.  Due to have open valve replacement the week of 5/29.  Patient s/p open AVR/TEE 08/06/19.  Precautions  Precautions Fall;Sternal  Pain Assessment  Pain Assessment Faces  Faces Pain Scale 2  Pain Location back pain, pt states from telemetry box  Pain Descriptors / Indicators Sore  Pain Intervention(s) Monitored during session  Cognition  Arousal/Alertness Awake/alert  Behavior During Therapy WFL for tasks assessed/performed  Overall Cognitive Status Within Functional Limits for tasks assessed  ADL  Overall ADL's  Modified independent  General ADL Comments Pt requesting to practice dressing and stepping over edge of tub to make sure he is adhering to sternal precautions. Also educated pt in energy conservation strategies.  Bed Mobility  Overal bed mobility Modified Independent  General bed mobility comments no assist for OOB using log roll technique, increased time, HOB up slightly  Balance  Overall balance assessment No apparent balance deficits (not formally assessed)  Sitting balance-Leahy Scale Good  Transfers  Overall transfer level Modified independent  Equipment used None  General  transfer comment used momentum to stand from elevated and low bed with hands on knees  OT - End of Session  Activity Tolerance Patient tolerated treatment well  Patient left  (walking about his room)  OT Assessment/Plan  OT Plan Discharge plan needs to be updated  OT Visit Diagnosis Pain;Muscle weakness (generalized) (M62.81)  OT Frequency (ACUTE ONLY) Min 2X/week  Follow Up Recommendations Home health OT;Supervision - Intermittent  OT Equipment 3 in 1 bedside commode;Tub/shower seat  AM-PAC OT "6 Clicks" Daily Activity Outcome Measure (Version 2)  Help from another person eating meals? 4  Help from another person taking care of personal grooming? 4  Help from another person toileting, which includes using toliet, bedpan, or urinal? 4  Help from another person bathing (including washing, rinsing, drying)? 4  Help from another person to put on and taking off regular upper body clothing? 4  Help from another person to put on and taking off regular lower body clothing? 4  6 Click Score 24  OT Goal Progression  Progress towards OT goals Progressing toward goals  Acute Rehab OT Goals  Patient Stated Goal return home  OT Goal Formulation With patient  Time For Goal Achievement 08/25/19  Potential to Achieve Goals Good  OT Time Calculation  OT Start Time (ACUTE ONLY) 1334  OT Stop Time (ACUTE ONLY) 1439  OT Time Calculation (min) 65 min  OT General Charges  $OT Visit 1 Visit  OT Treatments  $Self Care/Home Management  53-67 mins  Nestor Lewandowsky, OTR/L Acute Rehabilitation Services Pager: [phone] Office: [phone]

## 2019-08-14 NOTE — Progress Notes (Signed)
Physical Therapy Treatment Patient Details Name: Steve Richards MRN: [id number] DOB: [birth year] Today's Date: 08/14/2019    History of Present Illness 53y.o. male admitted on 07/22/19 for SOB and LE edema.  Dx with acute systolic/diastolic CHF, volume overload, COVID (+), LE dopplers pending to r/o DVT due to elevated D dimer, liver cirrhosis, L LE cellulits .  Pt with significant PMH of  Pleural effusion, cirrhosis of the liver with ascites d/p thoracentesis.  Caridology consulted and also found severe aortic stenosis.  Bil LE dopplers negative for DVT.  Due to have open valve replacement the week of 5/29.  Patient s/p open AVR/TEE 08/06/19.    PT Comments    Pt continues to do well. Able to get in/out of simulated car on rehab. Able to negotiate flight of steps. Recommend hospital bed for home since he has difficulty getting in/out of flat bed and his bed at home is mattress on the floor. Pt had been sleeping on couch for several months prior to admission. Currently won't be able to get off of low couch unassisted. As long as he isn't sitting on low surfaces at home his mobility will be good.   Follow Up Recommendations  Home health PT;Supervision - Intermittent     Equipment Recommendations  Rolling walker with 5" wheels;Other (comment);Hospital bed (pt may want to look at renting lift chair)    Recommendations for Other Services       Precautions / Restrictions Precautions Precautions: Fall;Sternal    Mobility  Bed Mobility Overal bed mobility: Needs Assistance Bed Mobility: Rolling;Sidelying to Sit;Sit to Sidelying Rolling: Modified independent (Device/Increase time) Sidelying to sit: Supervision     Sit to sidelying: Min assist;HOB elevated (elevated ~15 degrees) General bed mobility comments: Assist to bring lt leg up into bed going into sidelying.   Transfers Overall transfer level: Needs assistance Equipment used: Rolling walker (2 wheeled);None Transfers: Sit to/from  Stand Sit to Stand: Modified independent (Device/Increase time);From elevated surface         General transfer comment: Able to come to stand from slightly elevated surface  Ambulation/Gait Ambulation/Gait assistance: Modified independent (Device/Increase time) Gait Distance (Feet): 2000 Feet Assistive device: Rolling walker (2 wheeled);None Gait Pattern/deviations: Step-through pattern;Decreased stride length Gait velocity: decr Gait velocity interpretation: >2.62 ft/sec, indicative of community ambulatory General Gait Details: Modified independent in room with or without walker. In hall modified independent with rolling walker. Amb on RA with SpO2 89-93%.    Stairs   Stairs assistance: Supervision Stair Management: One rail Left;Step to pattern;Forwards Number of Stairs: 12 General stair comments: supervision for safety   Wheelchair Mobility    Modified Rankin (Stroke Patients Only)       Balance Overall balance assessment: No apparent balance deficits (not formally assessed)                                          Cognition Arousal/Alertness: Awake/alert Behavior During Therapy: WFL for tasks assessed/performed Overall Cognitive Status: Within Functional Limits for tasks assessed                                        Exercises      General Comments        Pertinent Vitals/Pain Pain Assessment: Faces Faces Pain Scale: Hurts a little bit Pain  Location: back pain with bed mobility Pain Descriptors / Indicators: Grimacing Pain Intervention(s): Repositioned    Home Living                      Prior Function            PT Goals (current goals can now be found in the care plan section) Progress towards PT goals: Progressing toward goals    Frequency    Min 3X/week      PT Plan Current plan remains appropriate    Co-evaluation              AM-PAC PT "6 Clicks" Mobility   Outcome Measure   Help needed turning from your back to your side while in a flat bed without using bedrails?: None Help needed moving from lying on your back to sitting on the side of a flat bed without using bedrails?: A Little Help needed moving to and from a bed to a chair (including a wheelchair)?: None Help needed standing up from a chair using your arms (e.g., wheelchair or bedside chair)?: None Help needed to walk in hospital room?: None Help needed climbing 3-5 steps with a railing? : A Little 6 Click Score: 22    End of Session   Activity Tolerance: Patient tolerated treatment well Patient left: Other (comment) (standing in room) Nurse Communication: Mobility status PT Visit Diagnosis: Other abnormalities of gait and mobility (R26.89);Muscle weakness (generalized) (M62.81)     Time: 1499-6924 PT Time Calculation (min) (ACUTE ONLY): 42 min  Charges:  $Gait Training: 23-37 mins $Therapeutic Activity: 8-22 mins                     [place] Pager [phone] Office [place] 08/14/2019, 10:23 AM

## 2019-08-14 NOTE — Consult Note (Signed)
   [facility] CM Inpatient Consult   08/14/2019  Steve Richards June 16, 1965 [id number]   [place] Organization [ACO] Patient:  *[employer]   Referral received late 08/13/19 from Inpatient Transition of Care RNCM to check if [place] Management service are available.  Review of patient's medical record reveals patient currently has no primary care provider [PCP] listed and no  PCP attributed in the [facility] roster.  Plan:  This Probation officer reached out to [place] and more information for needs was requested and primary care provider needs were addressed.  Referring inpatient TOC is not avialable today.  Follow up with current inpatient [facility] team member to update on information and assessment of post hospital care management needs. Patient will need a primary care provider and more information on post hospital disposition needs.  Reviewed PT/OT notes regarding hospital bed needs and his level activities of daily living needs. Home health could provide an aide for ADL , if appropriate.    Awaiting more specifics on post hospital care and disease management needs.  As of note, [facility] Care Management does not replace or interfere with services arranged by the inpatient [facility] team.  For questions contact:   Natividad Brood, RN BSN [facility] Liaison  [phone] business mobile phone Toll free office [phone]  Fax number: [phone] [email or web address]

## 2019-08-15 LAB — BASIC METABOLIC PANEL
Anion gap: 10 (ref 5–15)
BUN: 17 mg/dL (ref 6–20)
CO2: 32 mmol/L (ref 22–32)
Calcium: 9.2 mg/dL (ref 8.9–10.3)
Chloride: 92 mmol/L — ABNORMAL LOW (ref 98–111)
Creatinine, Ser: 0.8 mg/dL (ref 0.61–1.24)
GFR calc Af Amer: >60 mL/min (ref >60)
GFR calc non Af Amer: >60 mL/min (ref >60)
Glucose, Bld: 105 mg/dL — ABNORMAL HIGH (ref 70–99)
Potassium: 3.8 mmol/L (ref 3.5–5.1)
Sodium: 134 mmol/L — ABNORMAL LOW (ref 135–145)

## 2019-08-15 LAB — GLUCOSE, CAPILLARY
Glucose-Capillary: 118 mg/dL — ABNORMAL HIGH (ref 70–99)
Glucose-Capillary: 187 mg/dL — ABNORMAL HIGH (ref 70–99)
Glucose-Capillary: 124 mg/dL — ABNORMAL HIGH (ref 70–99)
Glucose-Capillary: 142 mg/dL — ABNORMAL HIGH (ref 70–99)

## 2019-08-15 NOTE — Progress Notes (Signed)
Mobility Specialist - Progress Note   08/15/19 1132  Mobility  Activity Ambulated in hall  Level of Assistance Independent  Assistive Device None  Distance Ambulated (ft) 1540 ft  Mobility Response Tolerated well  Mobility performed by Mobility specialist  $Mobility charge 1 Mobility    Pre-mobility: 96 HR, 92% SpO2 During mobility: 93 HR, 94%SpO2 Post-mobility: 92 HR, 93% SPO2  Pt needed one standing rest break at 1000 ft due to feeling out of breath.   Pricilla Handler Mobility Specialist'

## 2019-08-15 NOTE — Care Management (Signed)
Patient needs a PCP in [facility] network to qualify for remote services. Refer to community health for follow up. Place in patient instructions.

## 2019-08-15 NOTE — Social Work (Signed)
CSW acknowledges consult for SNF. OT/PT is  not recommending SNF. Consult is being closed out.

## 2019-08-15 NOTE — Progress Notes (Addendum)
[address]       [place], [address]             [phone]      9 Days Post-Op Procedure(s) (LRB): EXPLORATION POST OPERATIVE OPEN HEART (N/A) Subjective: conts to feel better  Objective: Vital signs in last 24 hours: Temp:  [98 F (36.7 C)-98.4 F (36.9 C)] 98 F (36.7 C) (06/12 0752) Pulse Rate:  [82-88] 85 (06/12 0752) Cardiac Rhythm: Normal sinus rhythm (06/12 0738) Resp:  [15-23] 17 (06/12 0752) BP: (100-125)/(58-77) 121/72 (06/12 0752) SpO2:  [90 %-96 %] 93 % (06/12 0752) Weight:  [101.3 kg] 101.3 kg (06/12 0414)  Hemodynamic parameters for last 24 hours:    Intake/Output from previous day: 06/11 0701 - 06/12 0700 In: 480 [P.O.:480] Out: 4125 [Urine:4125] Intake/Output this shift: Total I/O In: -  Out: 600 [Urine:600]  General appearance: alert, cooperative and no distress Heart: regular rate and rhythm and soft systolic murmur Lungs: min dim in bases Abdomen: benign Extremities: venous stasis dermatitis noted and edema not at baseline per patirnt but improved Wound: incis healing well  Lab Results: No results for input(s): WBC, HGB, HCT, PLT in the last 72 hours. BMET:  Recent Labs    08/13/19 0606 08/15/19 0500  NA 135 134*  K 4.1 3.8  CL 93* 92*  CO2 32 32  GLUCOSE 115* 105*  BUN 16 17  CREATININE 0.77 0.80  CALCIUM 8.8* 9.2    PT/INR: No results for input(s): LABPROT, INR in the last 72 hours. ABG    Component Value Date/Time   PHART 7.441 08/07/2019 1103   HCO3 26.5 08/07/2019 1103   TCO2 28 08/07/2019 1103   ACIDBASEDEF 1.0 08/06/2019 2310   O2SAT 73.8 08/10/2019 0331   CBG (last 3)  Recent Labs    08/14/19 1639 08/14/19 2155 08/15/19 0605  GLUCAP 131* 204* 118*    Meds Scheduled Meds: . aspirin EC  325 mg Oral Daily  . carvedilol  3.125 mg Oral BID WC  . Chlorhexidine Gluconate Cloth  6 each Topical Daily  . [place] [facility], Patient & Family Education   Does not apply Once  .  docusate sodium  200 mg Oral Daily  . enoxaparin (LOVENOX) injection  40 mg Subcutaneous Q24H  . feeding supplement (ENSURE ENLIVE)  237 mL Oral BID BM  . feeding supplement (PRO-STAT SUGAR FREE 64)  30 mL Oral Daily  . ferrous [id number] C-folic acid  1 capsule Oral TID PC  . furosemide  40 mg Oral Daily  . insulin aspart  0-24 Units Subcutaneous TID AC & HS  . levothyroxine  25 mcg Oral Q0600  . metFORMIN  500 mg Oral BID WC  . multivitamin with minerals  1 tablet Oral Daily  . pantoprazole  40 mg Oral QAC breakfast  . sodium chloride flush  10-40 mL Intracatheter Q12H  . sodium chloride flush  3 mL Intravenous Q12H   Continuous Infusions: . sodium chloride     PRN Meds:.sodium chloride, acetaminophen, bisacodyl **OR** bisacodyl, ondansetron **OR** ondansetron (ZOFRAN) IV, oxyCODONE, sodium chloride flush, sodium chloride flush, traMADol  Xrays No results found.  Assessment/Plan: S/P Procedure(s) (LRB): EXPLORATION POST OPERATIVE OPEN HEART (N/A)  1 conts to improve POD8 2 vitals stable in sinus rhythm, BP too low to initiate ARB/ACE-I 3 sats good on RA 5 creat remains stable, excellent UOP, weight conts to decrease- approaching euvolemia, much of LE edema is chronic venous insuff.  6 BS adeq control for inpatient post op 7 conts rehabs, HH being arranged for plan d/c on Monday if no new issues  LOS: 25 days    John Giovanni PA-C Pager [phone] 08/15/2019 Patient seen and examined, agree with above Did well with ambulation this AM  Remo Lipps C. Roxan Hockey, MD [facility] [phone]

## 2019-08-16 LAB — GLUCOSE, CAPILLARY
Glucose-Capillary: 110 mg/dL — ABNORMAL HIGH (ref 70–99)
Glucose-Capillary: 146 mg/dL — ABNORMAL HIGH (ref 70–99)
Glucose-Capillary: 144 mg/dL — ABNORMAL HIGH (ref 70–99)
Glucose-Capillary: 175 mg/dL — ABNORMAL HIGH (ref 70–99)

## 2019-08-16 NOTE — Progress Notes (Signed)
Mobility Specialist - Progress Note   08/16/19 1050  Mobility  Activity Ambulated in hall  Level of Assistance Independent  Assistive Device None  Distance Ambulated (ft) 1000 ft  Mobility Response Tolerated well  Mobility performed by Mobility specialist  $Mobility charge 1 Mobility    Pre-mobility: 80 HR, 94% SpO2 During mobility: 89 HR, 93% SpO2 Post-mobility: 89 HR, 94% SPO2  Steve Richards Solicitor

## 2019-08-16 NOTE — Progress Notes (Signed)
Pt's DL PICC both ports were clotted, replaced Capps  still un able to flush. Paged IV team to assess after several minutes iv team nurse was able to get the red port to flush with some resistance and maneuvering by IV RN. Purple port will not flush. Pt is expected to leave in the the am, per IV TEAM RN dont change the dressing at this time because it is very positional if pt stays past tomorrow further interventions may be needed

## 2019-08-16 NOTE — Progress Notes (Addendum)
[address]       [place] [address]             [phone]      10 Days Post-Op Procedure(s) (LRB): EXPLORATION POST OPERATIVE OPEN HEART (N/A) Subjective: conts to feel pretty well  Objective: Vital signs in last 24 hours: Temp:  [97.4 F (36.3 C)-98.6 F (37 C)] 98.1 F (36.7 C) (06/13 0752) Pulse Rate:  [82-89] 84 (06/13 0752) Cardiac Rhythm: Normal sinus rhythm (06/13 0743) Resp:  [15-20] 16 (06/13 0752) BP: (95-115)/(67-82) 112/77 (06/13 0752) SpO2:  [92 %-97 %] 94 % (06/13 0752) Weight:  [99.5 kg] 99.5 kg (06/13 0519)  Hemodynamic parameters for last 24 hours:    Intake/Output from previous day: 06/12 0701 - 06/13 0700 In: 720 [P.O.:720] Out: 1200 [Urine:1200] Intake/Output this shift: No intake/output data recorded.  General appearance: alert, cooperative and no distress Heart: regular rate and rhythm Lungs: clear to auscultation bilaterally Abdomen: benign Extremities: chronic cenous stasis changes, + edema- conts to improve Wound: incis healing well  Lab Results: No results for input(s): WBC, HGB, HCT, PLT in the last 72 hours. BMET:  Recent Labs    08/15/19 0500  NA 134*  K 3.8  CL 92*  CO2 32  GLUCOSE 105*  BUN 17  CREATININE 0.80  CALCIUM 9.2    PT/INR: No results for input(s): LABPROT, INR in the last 72 hours. ABG    Component Value Date/Time   PHART 7.441 08/07/2019 1103   HCO3 26.5 08/07/2019 1103   TCO2 28 08/07/2019 1103   ACIDBASEDEF 1.0 08/06/2019 2310   O2SAT 73.8 08/10/2019 0331   CBG (last 3)  Recent Labs    08/15/19 1620 08/15/19 2150 08/16/19 0619  GLUCAP 124* 142* 110*    Meds Scheduled Meds: . aspirin EC  325 mg Oral Daily  . carvedilol  3.125 mg Oral BID WC  . Chlorhexidine Gluconate Cloth  6 each Topical Daily  . [place] [facility], Patient & Family Education   Does not apply Once  . docusate sodium  200 mg Oral Daily  . enoxaparin (LOVENOX) injection  40 mg Subcutaneous  Q24H  . feeding supplement (ENSURE ENLIVE)  237 mL Oral BID BM  . feeding supplement (PRO-STAT SUGAR FREE 64)  30 mL Oral Daily  . ferrous [id number] C-folic acid  1 capsule Oral TID PC  . furosemide  40 mg Oral Daily  . insulin aspart  0-24 Units Subcutaneous TID AC & HS  . levothyroxine  25 mcg Oral Q0600  . metFORMIN  500 mg Oral BID WC  . multivitamin with minerals  1 tablet Oral Daily  . pantoprazole  40 mg Oral QAC breakfast  . sodium chloride flush  10-40 mL Intracatheter Q12H  . sodium chloride flush  3 mL Intravenous Q12H   Continuous Infusions: . sodium chloride     PRN Meds:.sodium chloride, acetaminophen, bisacodyl **OR** bisacodyl, ondansetron **OR** ondansetron (ZOFRAN) IV, oxyCODONE, sodium chloride flush, sodium chloride flush, traMADol  Xrays No results found.  Assessment/Plan: S/P Procedure(s) (LRB): EXPLORATION POST OPERATIVE OPEN HEART (N/A)  1 conts to improve POD#9 2 afeb, VSS 3 sats good on RA 4 sinus rhythm 5 BS controlled 6 no new labs or Xrays 7 plan home in am if no new issues   LOS: 26 days    John Giovanni PA-C Pager [phone] 08/16/2019 Patient seen and examined, agree with above  Remo Lipps C. Roxan Hockey, MD [facility] [phone]

## 2019-08-17 ENCOUNTER — Other Ambulatory Visit: Payer: Self-pay | Admitting: Physician Assistant

## 2019-08-17 LAB — GLUCOSE, CAPILLARY
Glucose-Capillary: 112 mg/dL — ABNORMAL HIGH (ref 70–99)
Glucose-Capillary: 142 mg/dL — ABNORMAL HIGH (ref 70–99)

## 2019-08-17 MED ORDER — ACETAMINOPHEN 325 MG PO TABS: 650.0000 mg | ORAL_TABLET | Freq: Four times a day (QID) | ORAL | Status: AC | PRN

## 2019-08-17 MED ORDER — GLUCOSE BLOOD VI STRP: ORAL_STRIP | 12 refills | Status: DC

## 2019-08-17 MED ORDER — ENSURE ENLIVE PO LIQD: 237.0000 mL | Freq: Two times a day (BID) | ORAL | 12 refills | Status: DC

## 2019-08-17 MED ORDER — CARVEDILOL 3.125 MG PO TABS: 3.1250 mg | ORAL_TABLET | Freq: Two times a day (BID) | ORAL | 1 refills | Status: DC

## 2019-08-17 MED ORDER — ONETOUCH ULTRASOFT LANCETS MISC
5 refills | Status: DC
Start: 2019-08-17 — End: 2019-08-21

## 2019-08-17 MED ORDER — LIVING WELL WITH DIABETES BOOK
Freq: Once | Status: AC
  Filled 2019-08-17 (×2): qty 1

## 2019-08-17 MED ORDER — LEVOTHYROXINE SODIUM 25 MCG PO TABS: 25.0000 ug | ORAL_TABLET | Freq: Every day | ORAL | 1 refills | Status: DC

## 2019-08-17 MED ORDER — METFORMIN HCL 500 MG PO TABS: 500.0000 mg | ORAL_TABLET | Freq: Two times a day (BID) | ORAL | 1 refills | Status: DC

## 2019-08-17 MED ORDER — LIVING WELL WITH DIABETES BOOK: 1.0000 | Freq: Once | 0 refills | Status: AC

## 2019-08-17 MED ORDER — PRO-STAT SUGAR FREE PO LIQD: 30.0000 mL | Freq: Every day | ORAL | 0 refills | Status: DC

## 2019-08-17 MED ORDER — ASPIRIN 325 MG PO TBEC: 325.0000 mg | DELAYED_RELEASE_TABLET | Freq: Every day | ORAL | 0 refills | Status: DC

## 2019-08-17 MED ORDER — BLOOD GLUCOSE MONITOR KIT: PACK | 0 refills | Status: DC

## 2019-08-17 NOTE — Progress Notes (Signed)
Inpatient Diabetes Program Recommendations  AACE/ADA: New Consensus Statement on Inpatient Glycemic Control (2015)  Target Ranges:  Prepandial:   less than 140 mg/dL      Peak postprandial:   less than 180 mg/dL (1-2 hours)      Critically ill patients:  140 - 180 mg/dL   Lab Results  Component Value Date   GLUCAP 112 (H) 08/17/2019   HGBA1C 6.1 (H) 08/06/2019    Review of Glycemic Control Results for Steve Richards, Steve Richards (MRN [id number]) as of 08/17/2019 11:14  Ref. Range 08/16/2019 06:19 08/16/2019 11:17 08/16/2019 16:08 08/16/2019 22:26 08/17/2019 06:13  Glucose-Capillary Latest Ref Range: 70 - 99 mg/dL 110 (H) 146 (H) 144 (H) 175 (H) 112 (H)   Diabetes history:  New DM  Outpatient Diabetes medications:  None  Current orders for Inpatient glycemic control:  Novolog 0-24 tid & hs Metformin 500 mg bid   Note:  Spoke with patient at bedside about new diagnosis of DM. Discussed A1C results with him and explained what an A1C is, basic pathophysiology of DM Type 2, basic home care, basic diabetes diet nutrition principles, importance of checking CBGs and maintaining good CBG control to prevent long-term and short-term complications. Reviewed signs and symptoms of hyperglycemia and hypoglycemia and how to treat hypoglycemia at home. Also reviewed blood sugar goals at home.  RNs to provide ongoing basic DM education at bedside with this patient. Have ordered educational booklet, and DM videos. Have also placed RD consult for DM diet education for this patient.  Discussed CHO's and importance of limiting CHO's to 50-60 per meal.  He does drink regular sodas occasionally.    Educated patient on Metformin and side effects.    Will continue to follow while inpatient.  Thank you, Reche Dixon, RN, BSN Diabetes Coordinator Inpatient Diabetes Program [phone] (team pager from 8a-5p)

## 2019-08-17 NOTE — Progress Notes (Signed)
CARDIAC REHAB PHASE I   PICC recently d/c'd, pt on bedrest. Reinforced d/c education. Reviewed importance of site care, and monitoring incision daily. Encouraged continued IS use, walks, and daily weights. Pt requesting TOC pharmacy, pharmacist made aware. Pt also concerned with status of home equipment and HH, CM made aware. Pt referred to [facility] [place].   [id number] Rufina Falco, RN BSN 08/17/2019 10:58 AM

## 2019-08-17 NOTE — Progress Notes (Signed)
Physical Therapy Treatment Patient Details Name: Steve Richards MRN: [id number] DOB: April 26, 1965 Today's Date: 08/17/2019    History of Present Illness 53y.o. male admitted on 07/22/19 for SOB and LE edema.  Dx with acute systolic/diastolic CHF, volume overload, COVID (+), LE dopplers pending to r/o DVT due to elevated D dimer, liver cirrhosis, L LE cellulits .  Pt with significant PMH of  Pleural effusion, cirrhosis of the liver with ascites d/p thoracentesis.  Caridology consulted and also found severe aortic stenosis.  Bil LE dopplers negative for DVT.  Due to have open valve replacement the week of 5/29.  Patient s/p open AVR/TEE 08/06/19.    PT Comments    Pt was seen for review of gait and stair climbing, and did note his use of conversation during stairs drove sats on room air down to 88%.  He is aware of the issue and reports it is not that uncommon to have low sats.  Follow along acutely to get stronger and increase standing balance to reduce the burden of work and reduce O2 consumption for mobility.  Home health PT is still planned but expect follow up with cardiac rehab afterward.   Follow Up Recommendations  Home health PT     Equipment Recommendations  Rolling walker with 5" wheels    Recommendations for Other Services       Precautions / Restrictions Precautions Precautions: Fall;Sternal Restrictions Weight Bearing Restrictions: Yes Other Position/Activity Restrictions: sternal     Mobility  Bed Mobility Overal bed mobility: Modified Independent             General bed mobility comments: up to side of bed without help and then to stand alone  Transfers Overall transfer level: Modified independent Equipment used: None             General transfer comment: transfers off side of bed from very low perch, no signs of effort and fully avoided using UE's  Ambulation/Gait Ambulation/Gait assistance: Supervision Gait Distance (Feet): 300 Feet Assistive device:  None Gait Pattern/deviations: Step-through pattern;Wide base of support;Trunk flexed (mild trunk flexion) Gait velocity: decr Gait velocity interpretation: <1.31 ft/sec, indicative of household ambulator     Stairs Stairs: Yes Stairs assistance: Supervision Stair Management: One rail Left;Forwards;Step to pattern;Alternating pattern Number of Stairs: 10 General stair comments: supervision for safety (had O2 sats drop to 88% but was talking when asked not to do)   Wheelchair Mobility    Modified Rankin (Stroke Patients Only)       Balance Overall balance assessment: No apparent balance deficits (not formally assessed) Sitting-balance support: No upper extremity supported;Feet supported Sitting balance-Leahy Scale: Good     Standing balance support: No upper extremity supported Standing balance-Leahy Scale: Fair                              Cognition Arousal/Alertness: Awake/alert Behavior During Therapy: WFL for tasks assessed/performed Overall Cognitive Status: Within Functional Limits for tasks assessed                                        Exercises      General Comments        Pertinent Vitals/Pain Pain Assessment: Faces Faces Pain Scale: Hurts a little bit Pain Location: minimal back stiffness Pain Intervention(s): Monitored during session;Repositioned    Home Living  Prior Function            PT Goals (current goals can now be found in the care plan section) Acute Rehab PT Goals Patient Stated Goal: return home Progress towards PT goals: Progressing toward goals    Frequency    Min 3X/week      PT Plan Current plan remains appropriate    Co-evaluation              AM-PAC PT "6 Clicks" Mobility   Outcome Measure  Help needed turning from your back to your side while in a flat bed without using bedrails?: None Help needed moving from lying on your back to sitting on the  side of a flat bed without using bedrails?: A Little Help needed moving to and from a bed to a chair (including a wheelchair)?: None Help needed standing up from a chair using your arms (e.g., wheelchair or bedside chair)?: A Little Help needed to walk in hospital room?: None Help needed climbing 3-5 steps with a railing? : A Little 6 Click Score: 21    End of Session Equipment Utilized During Treatment: Gait belt Activity Tolerance: Patient tolerated treatment well;Treatment limited secondary to medical complications (Comment) (O2 sats with stairs with speaking as well) Patient left: in bed;with call bell/phone within reach;Other (comment) (case manager arrived to help with equipment planning) Nurse Communication: Mobility status PT Visit Diagnosis: Other abnormalities of gait and mobility (R26.89);Muscle weakness (generalized) (M62.81) Pain - Right/Left: Right Pain - part of body: Shoulder     Time: 3335-4562 PT Time Calculation (min) (ACUTE ONLY): 21 min  Charges:  $Gait Training: 8-22 mins                    Ramond Dial 08/17/2019, 1:03 PM  Mee Hives, PT MS Acute Rehab Dept. Number: [place]

## 2019-08-17 NOTE — Progress Notes (Signed)
Mobility Specialist - Progress Note   08/17/19 1329  Mobility  Activity Ambulated in hall  Level of Assistance Independent  Assistive Device None  Distance Ambulated (ft) 1000 ft  Mobility Response Tolerated well  Mobility performed by Mobility specialist  $Mobility charge 1 Mobility    Pre-mobility: 94 HR, 94% SpO2 During mobility: 97 HR, 97% SpO2 Post-mobility: 97 HR, 93% SPO2   Steve Richards Solicitor

## 2019-08-17 NOTE — Discharge Instructions (Signed)
Discharge Instructions:  1. You may shower, please wash incisions daily with soap and water and keep dry.  If you wish to cover wounds with dressing you may do so but please keep clean and change daily.  No tub baths or swimming until incisions have completely healed.  If your incisions become red or develop any drainage please call our office at [phone]  2. No Driving until cleared by Dr. Vivi Martens office and you are no longer using narcotic pain medications  3. Monitor your weight daily.. Please use the same scale and weigh at same time... If you gain 3-5 lbs in 48 hours with associated lower extremity swelling, please contact our office at [phone]  4. Fever of 101.5 for at least 24 hours with no source, please contact our office at [phone]  5. Activity- up as tolerated, please walk at least 3 times per day.  Avoid strenuous activity, no lifting, pushing, or pulling with your arms over 8-10 lbs for a minimum of 6 weeks  6. If any questions or concerns arise, please do not hesitate to contact our office at [phone]

## 2019-08-17 NOTE — Progress Notes (Signed)
Pt discharged from unit. Medication/discharge instruction given, VVS  Alexus Galka K Jessice Madill, RN   

## 2019-08-17 NOTE — TOC Transition Note (Signed)
Transition of Care [facility]) - CM/SW Discharge Note Marvetta Gibbons RN, BSN Transitions of Care Unit 4E- RN Case Manager [phone]   Patient Details  Name: Steve Richards MRN: [id number] Date of Birth: 07/27/1965  Transition of Care [facility]) CM/SW Contact:  Dawayne Patricia, RN Phone Number: 08/17/2019, 4:35 PM   Clinical Narrative:    Pt stable for transition home today, f/u done with pt regarding transition of care needs for [facility] and DME. Spoke with pt at bedside- Confirmed with pt DME needs- orders placed for RW and 3n1- pt states he does not need RW however does want 3n1- no order placed for hospital bed- discussed with pt that if he wants a bed it would be an out of pocket cost- as without MD order insurance will not cover cost. Patient requesting to speak with [employer] rep. Regarding rental of bed. - call made to [facility] with [employer] for DME need- 3n1 to be delivered to room prior to d/c and they will call pt regarding bed.  Also discussed with pt PCP need- info provided to pt on how to sign up for PCP with his insurance to be sure provider is in network- pt reports he has passwords at home for insurance hub and will do this when he returns home. THN to f/u after PCP established. Choice offered of in net work providers for [employer]- pt has selected [facility] as first choice- call made to Butch Penny w/ [facility] for [facility] Referral- referral for RN/PT/OT- as been accepted.   All questions answered that pt had regarding transition home. No further CM needs noted.    Final next level of care: [place] Barriers to Discharge: Barriers Resolved   Patient Goals and CMS Choice Patient states their goals for this hospitalization and ongoing recovery are:: to get home and get stronger, to get back to work [employer].gov Compare Post Acute Care list provided to:: Patient Choice offered to / list presented to : Patient  Discharge Placement                 home with [facility]      Discharge Plan and  Services In-house Referral: Clinical Social Work Discharge Planning Services: CM Consult Post Acute Care Choice: Durable Medical Equipment, Home Health          DME Arranged: 3-N-1 DME Agency: [employer] Date DME Agency Contacted: 08/17/19 Time DME Agency Contacted: 1200 Representative spoke with at DME Agency: [place]: RN, PT, OT [facility] Agency: [place] ([place]) Date [place]: 08/17/19 Time [place]: 1200 Representative spoke with at [place]: [place] ([place]) Interventions     Readmission Risk Interventions Readmission Risk Prevention Plan 08/17/2019  Transportation Screening Complete  PCP or Specialist Appt within 5-7 Days Complete  Home Care Screening Complete  Medication Review (RN CM) Complete  Some recent data might be hidden

## 2019-08-17 NOTE — Progress Notes (Addendum)
      [address]       ,[place] [address]             [phone]      11 Days Post-Op Procedure(s) (LRB): EXPLORATION POST OPERATIVE OPEN HEART (N/A) Subjective: Ready for discharge this morning. He has several questions regarding his discharge.   Objective: Vital signs in last 24 hours: Temp:  [97.7 F (36.5 C)-98.2 F (36.8 C)] 97.7 F (36.5 C) (06/14 0526) Pulse Rate:  [78-94] 86 (06/14 0526) Cardiac Rhythm: Normal sinus rhythm (06/14 0812) Resp:  [16-21] 18 (06/14 0526) BP: (101-121)/(72-80) 102/72 (06/14 0526) SpO2:  [90 %-98 %] 98 % (06/14 0526) Weight:  [100.4 kg] 100.4 kg (06/14 0526)     Intake/Output from previous day: 06/13 0701 - 06/14 0700 In: 840 [P.O.:840] Out: -  Intake/Output this shift: No intake/output data recorded.  General appearance: alert, cooperative and no distress Heart: regular rate and rhythm, S1, S2 normal, no murmur, click, rub or gallop Lungs: clear to auscultation bilaterally Abdomen: soft, non-tender; bowel sounds normal; no masses,  no organomegaly Extremities: 2-3 + pitting edema in bilateral feet with improving edema in bilateral lower legs. Wound: sternotomy incision is healing well  Lab Results: No results for input(s): WBC, HGB, HCT, PLT in the last 72 hours. BMET:  Recent Labs    08/15/19 0500  NA 134*  K 3.8  CL 92*  CO2 32  GLUCOSE 105*  BUN 17  CREATININE 0.80  CALCIUM 9.2    PT/INR: No results for input(s): LABPROT, INR in the last 72 hours. ABG    Component Value Date/Time   PHART 7.441 08/07/2019 1103   HCO3 26.5 08/07/2019 1103   TCO2 28 08/07/2019 1103   ACIDBASEDEF 1.0 08/06/2019 2310   O2SAT 73.8 08/10/2019 0331   CBG (last 3)  Recent Labs    08/16/19 1608 08/16/19 2226 08/17/19 0613  GLUCAP 144* 175* 112*    Assessment/Plan: S/P Procedure(s) (LRB): EXPLORATION POST OPERATIVE OPEN HEART (N/A)  1. CV-NSR in the 80s, BP well controlled however low at times. Continue asa  and coreg. 2. Pulm- tolerating room air with good oxygen saturation. Continue incentive spirometer 3. Renal-creatinine 0.80, electrolytes okay. No new labs since Saturday. Weight is down to 100kg from 140kg.  4. H and H stable, expected acute blood loss anemia 5. Endo-blood glucose well controlled. Started on metformin this admission.  Plan: Will order [facility] PT/OT/RN to assist at home. He already has an appointment on Monday with Dr. Cyndia Bent.     LOS: 27 days    Elgie Collard 08/17/2019   Chart reviewed, patient examined, agree with above. He looks good. Still has some lower leg edema but slowly improving and some of this is probably due to venous insufficiency. He has appt at wound care center on Wednesday concerning lower leg ulcers he had on admission that have subsequently closed. I will see on Monday with CBC and BMET.

## 2019-08-19 ENCOUNTER — Encounter (HOSPITAL_BASED_OUTPATIENT_CLINIC_OR_DEPARTMENT_OTHER): Payer: 59 | Attending: Internal Medicine | Admitting: Physician Assistant

## 2019-08-19 ENCOUNTER — Telehealth (HOSPITAL_COMMUNITY): Payer: Self-pay

## 2019-08-19 ENCOUNTER — Other Ambulatory Visit: Payer: Self-pay

## 2019-08-19 DIAGNOSIS — K7460 Unspecified cirrhosis of liver: Secondary | ICD-10-CM | POA: Insufficient documentation

## 2019-08-19 DIAGNOSIS — I509 Heart failure, unspecified: Secondary | ICD-10-CM | POA: Insufficient documentation

## 2019-08-19 DIAGNOSIS — L97812 Non-pressure chronic ulcer of other part of right lower leg with fat layer exposed: Secondary | ICD-10-CM | POA: Insufficient documentation

## 2019-08-19 DIAGNOSIS — Z954 Presence of other heart-valve replacement: Secondary | ICD-10-CM | POA: Insufficient documentation

## 2019-08-19 NOTE — Progress Notes (Signed)
Steve Richards ([id number]) , Visit Report for 08/19/2019 Abuse/Suicide Risk Screen Details Steve Name: Date of Service: DO Steve Richards [place] 08/19/2019 1:15 PM Medical Record Number: [id number] Steve Account Number: [id number] Date of Birth/Sex: Treating RN: 11-02-1965 (54 y.o. Steve Richards Primary Care Steve Richards: SYSTEM, PCP Other Clinician: Referring Steve Richards: Treating Steve Richards/Extender: [name] in Treatment: 0 Abuse/Suicide Risk Screen Items Answer ABUSE RISK SCREEN: Has anyone close to you tried to hurt or harm you recentlyo No Do you feel uncomfortable with anyone in your familyo No Has anyone forced you do things that you didnt want to doo No Electronic Signature(Richards) Signed: 08/19/2019 4:55:38 PM By: [name] Entered By: [name] on 08/19/2019 13:46:41 -------------------------------------------------------------------------------- Activities of Daily Living Details Steve Name: Date of Service: DO Steve Richards 08/19/2019 1:15 PM Medical Record Number: [id number] Steve Account Number: [id number] Date of Birth/Sex: Treating RN: [birth year] (54 y.o. Steve Richards Primary Care Steve Richards: SYSTEM, PCP Other Clinician: Referring Steve Richards: Treating Steve Richards/Extender: [name] in Treatment: 0 Activities of Daily Living Items Answer Activities of Daily Living (Please select one for each item) Drive Automobile Completely Able T Medications ake Completely Able Use T elephone Completely Able Care for Appearance Completely Able Use T oilet Completely Able Bath / Shower Completely Able Dress Self Completely Able Feed Self Completely Able Walk Completely Able Get In / Out Bed Completely Able Housework Completely Able Prepare Meals Completely [place] for Self Completely Able Electronic Signature(Richards) Signed: 08/19/2019 4:55:38 PM By: [name] Entered By: [name] on 08/19/2019  13:47:08 -------------------------------------------------------------------------------- Education Screening Details Steve Name: Date of Service: DO Steve Richards 08/19/2019 1:15 PM Medical Record Number: [id number] Steve Account Number: [id number] Date of Birth/Sex: Treating RN: 1965/08/20 (54 y.o. Steve Richards Primary Care [name]: SYSTEM, PCP Other Clinician: Referring Steve Richards: Treating Steve Richards/Extender: [name] in Treatment: 0 Primary Learner Assessed: Steve Learning Preferences/Education Level/Primary Language Learning Preference: Explanation Highest Education Level: College or Above Preferred Language: English Cognitive Barrier Language Barrier: No Translator Needed: No Memory Deficit: No Emotional Barrier: No Cultural/Religious Beliefs Affecting Medical Care: No Physical Barrier Impaired Vision: No Impaired Hearing: No Decreased Hand dexterity: No Knowledge/Comprehension Knowledge Level: High Comprehension Level: High Ability to understand written instructions: High Ability to understand verbal instructions: High Motivation Anxiety Level: Calm Cooperation: Cooperative Education Importance: Acknowledges Need Interest in Health Problems: Asks Questions Perception: Coherent Willingness to Engage in Self-Management High Activities: Readiness to Engage in Self-Management High Activities: Electronic Signature(Richards) Signed: 08/19/2019 4:55:38 PM By: [name] Entered By: [name] on 08/19/2019 13:48:59 -------------------------------------------------------------------------------- Fall Risk Assessment Details Steve Name: Date of Service: DO Steve Richards 08/19/2019 1:15 PM Medical Record Number: [id number] Steve Account Number: [id number] Date of Birth/Sex: Treating RN: Jul 30, 1965 (54 y.o. Steve Richards Primary Care Steve Richards: SYSTEM, PCP Other Clinician: Referring Steve Richards: Treating Steve Richards/Extender: [name] in Treatment: 0 Fall Risk Assessment Items Have you had 2 or more falls in the last 12 monthso 0 No Have you had any fall that resulted in injury in the last 12 monthso 0 No FALLS RISK SCREEN History of falling - immediate or within 3 months 0 No Secondary diagnosis (Do you have 2 or more medical diagnoseso) 15 Yes Ambulatory aid None/bed rest/wheelchair/nurse 0 Yes Crutches/cane/walker 0 No Furniture 0 No Intravenous therapy Access/Saline/Heparin Lock 0 No Gait/Transferring Normal/ bed rest/ wheelchair 0 Yes Weak (short steps with or without shuffle, stooped but able to lift  head while walking, may seek 0 No support from furniture) Impaired (short steps with shuffle, may have difficulty arising from chair, head down, impaired 0 No balance) Mental Status Oriented to own ability 0 Yes Electronic Signature(Richards) Signed: 08/19/2019 4:55:38 PM By: [name] Entered By: [name] on 08/19/2019 13:50:06 -------------------------------------------------------------------------------- Foot Assessment Details Steve Name: Date of Service: [name], Steve Richards 08/19/2019 1:15 PM Medical Record Number: [id number] Steve Account Number: [id number] Date of Birth/Sex: Treating RN: [birth year] (54 y.o. Steve Richards Primary Care Steve Richards: SYSTEM, PCP Other Clinician: Referring Steve Richards: Treating Steve Richards/Extender: [name] in Treatment: 0 Foot Assessment Items Site Locations + = Sensation present, - = Sensation absent, C = Callus, U = Ulcer R = Redness, W = Warmth, M = Maceration, PU = Pre-ulcerative lesion F = Fissure, Richards = Swelling, D = Dryness Assessment Right: Left: Other Deformity: No No Prior Foot Ulcer: No No Prior Amputation: No No Charcot Joint: No No Ambulatory Status: Ambulatory Without Help Gait: Steady Electronic Signature(Richards) Signed: 08/19/2019 4:55:38 PM By: [name] Entered By: [name] on 08/19/2019  13:50:24 -------------------------------------------------------------------------------- Nutrition Risk Screening Details Steve Name: Date of Service: DO Steve Richards 08/19/2019 1:15 PM Medical Record Number: [id number] Steve Account Number: [id number] Date of Birth/Sex: Treating RN: Apr 23, 1965 (54 y.o. Steve Richards Primary Care Aranza Geddes: SYSTEM, PCP Other Clinician: Referring Saryna Kneeland: Treating Rocklyn Mayberry/Extender: [name] in Treatment: 0 Height (in): 72 Weight (lbs): 225 Body Mass Index (BMI): 30.5 Nutrition Risk Screening Items Score Screening NUTRITION RISK SCREEN: I have an illness or condition that made me change the kind and/or amount of food I eat 0 No I eat fewer than two meals per day 0 No I eat few fruits and vegetables, or milk products 0 No I have three or more drinks of beer, liquor or wine almost every day 0 No I have tooth or mouth problems that make it hard for me to eat 0 No I don't always have enough money to buy the food I need 0 No I eat alone most of the time 0 No I take three or more different prescribed or over-the-counter drugs a day 1 Yes Without wanting to, I have lost or gained 10 pounds in the last six months 0 No I am not always physically able to shop, cook and/or feed myself 0 No Nutrition Protocols Good Risk Protocol 0 No interventions needed Moderate Risk Protocol High Risk Proctocol Risk Level: Good Risk Score: 1 Electronic Signature(Richards) Signed: 08/19/2019 4:55:38 PM By: [name] Entered By: [name] on 08/19/2019 13:50:16

## 2019-08-19 NOTE — Telephone Encounter (Signed)
Pt insurance is active and benefits verified through [place] 0, DED 0/0 met, out of pocket $2,400/$274.43 met, co-insurance 25%. no pre-authorization required. Passport, [place]/[employer], REF# [id number]  Will contact patient to see if he is interested in the Cardiac Rehab Program. If interested, patient will need to complete follow up appt. Once completed, patient will be contacted for scheduling upon review by the RN Navigator.

## 2019-08-20 NOTE — Progress Notes (Signed)
Steve Richards ([id number]) , Visit Report for 08/19/2019 Allergy List Details Patient Name: Date of Service: DO Steve Richards [place] 08/19/2019 1:15 PM Medical Record Number: [id number] Patient Account Number: [id number] Date of Birth/Sex: Treating RN: 01/03/1966 (54 y.o. Male) Steve Richards Primary Care Steve Richards: SYSTEM, PCP Other Clinician: Referring Steve Richards: Treating Steve Richards/Extender: [name] Richards in Richards: 0 Allergies Active Allergies No Known Allergies Allergy Notes Electronic Signature(s) Signed: 08/19/2019 4:55:38 PM By: Steve Richards Entered By: Steve Richards on 08/19/2019 13:32:47 -------------------------------------------------------------------------------- Arrival Information Details Patient Name: Date of Service: [name], [name] 08/19/2019 1:15 PM Medical Record Number: [id number] Patient Account Number: [id number] Date of Birth/Sex: Treating RN: 1965/07/15 (54 y.o. Male) Steve Richards Primary Care Steve Richards: SYSTEM, PCP Other Clinician: Referring Steve Richards: Treating Steve Richards/Extender: Steve Richards: 0 Visit Information Patient Arrived: Ambulatory Arrival Time: 13:25 Accompanied By: self Transfer Assistance: None Patient Identification Verified: Yes Secondary Verification Process Completed: Yes Patient Has Alerts: Yes Patient Alerts: Patient on Blood Thinner Electronic Signature(s) Signed: 08/19/2019 4:55:38 PM By: Steve Richards Entered By: Steve Richards on 08/19/2019 14:04:52 -------------------------------------------------------------------------------- Clinic Level of Care Assessment Details Patient Name: Date of Service: DO Steve Richards 08/19/2019 1:15 PM Medical Record Number: [id number] Patient Account Number: [id number] Date of Birth/Sex: Treating RN: April 20, 1965 (54 y.o. Male) Steve Richards Primary Care Steve Richards: SYSTEM, PCP Other Clinician: Referring Steve Richards: Treating Steve Richards: Steve Richards: 0 Clinic Level of Care Assessment Items TOOL 2 Quantity Score []  - 0 Use when only an EandM is performed on the INITIAL visit ASSESSMENTS - Nursing Assessment / Reassessment X- 1 20 General Physical Exam (combine w/ comprehensive assessment (listed just below) when performed on new pt. evals) X- 1 25 Comprehensive Assessment (HX, ROS, Risk Assessments, Wounds Hx, etc.) ASSESSMENTS - Wound and Skin A ssessment / Reassessment X - Simple Wound Assessment / Reassessment - one wound 1 5 []  - 0 Complex Wound Assessment / Reassessment - multiple wounds []  - 0 Dermatologic / Skin Assessment (not related to wound area) ASSESSMENTS - Ostomy and/or Continence Assessment and Care []  - 0 Incontinence Assessment and Management []  - 0 Ostomy Care Assessment and Management (repouching, etc.) PROCESS - Coordination of Care X - Simple Patient / Family Education for ongoing care 1 15 []  - 0 Complex (extensive) Patient / Family Education for ongoing care X- 1 10 Staff obtains Programmer, systems, Records, T Results / Process Orders est []  - 0 Staff telephones HHA, Nursing Homes / Clarify orders / etc []  - 0 Routine Transfer to another Facility (non-emergent condition) []  - 0 Routine Hospital Admission (non-emergent condition) []  - 0 New Admissions / Biomedical engineer / Ordering NPWT Apligraf, etc. , []  - 0 Emergency Hospital Admission (emergent condition) X- 1 10 Simple Discharge Coordination []  - 0 Complex (extensive) Discharge Coordination PROCESS - Special Needs []  - 0 Pediatric / Minor Patient Management []  - 0 Isolation Patient Management []  - 0 Hearing / Language / Visual special needs []  - 0 Assessment of Community assistance (transportation, D/C planning, etc.) []  - 0 Additional assistance / Altered mentation []  - 0 Support Surface(s) Assessment (bed, cushion, seat, etc.) INTERVENTIONS - Wound Cleansing / Measurement []  - 0 Wound Imaging  (photographs - any number of wounds) []  - 0 Wound Tracing (instead of photographs) []  - 0 Simple Wound Measurement - one wound []  - 0 Complex Wound Measurement - multiple wounds []  - 0 Simple Wound Cleansing - one wound []  - 0 Complex Wound Cleansing -  multiple wounds INTERVENTIONS - Wound Dressings []  - 0 Small Wound Dressing one or multiple wounds []  - 0 Medium Wound Dressing one or multiple wounds []  - 0 Large Wound Dressing one or multiple wounds []  - 0 Application of Medications - injection INTERVENTIONS - Miscellaneous []  - 0 External ear exam []  - 0 Specimen Collection (cultures, biopsies, blood, body fluids, etc.) []  - 0 Specimen(s) / Culture(s) sent or taken to Lab for analysis []  - 0 Patient Transfer (multiple staff / Steve Richards) []  - 0 Simple Staple / Suture removal (25 or less) []  - 0 Complex Staple / Suture removal (26 or more) []  - 0 Hypo / Hyperglycemic Management (close monitor of Blood Glucose) X- 1 15 Ankle / Brachial Index (ABI) - do not check if billed separately Has the patient been seen at the hospital within the last three years: Yes Total Score: 100 Level Of Care: New/Established - Level 3 Electronic Signature(s) Signed: 08/20/2019 5:22:52 PM By: Steve Gouty RN, BSN Entered By: Steve Richards on 08/19/2019 14:38:17 -------------------------------------------------------------------------------- Encounter Discharge Information Details Patient Name: Date of Service: [name], [name] 08/19/2019 1:15 PM Medical Record Number: [id number] Patient Account Number: [id number] Date of Birth/Sex: Treating RN: 1965/03/26 (54 y.o. Male) Steve Richards Primary Care Amora Sheehy: SYSTEM, PCP Other Clinician: Referring Steve Richards: Treating Steve Richards/Extender: Steve Richards: 0 Encounter Discharge Information Items Discharge Condition: Stable Ambulatory Status: Ambulatory Discharge Destination: Home Transportation: Private  Auto Accompanied By: self Schedule Follow-up Appointment: Yes Clinical Summary of Care: Patient Declined Electronic Signature(s) Signed: 08/19/2019 4:55:19 PM By: Steve Coria RN Entered By: Steve Richards on 08/19/2019 14:39:18 -------------------------------------------------------------------------------- Lower Extremity Assessment Details Patient Name: Date of Service: DO Steve Richards 08/19/2019 1:15 PM Medical Record Number: [id number] Patient Account Number: [id number] Date of Birth/Sex: Treating RN: [birth year] (54 y.o. Male) Steve Richards Primary Care Gabriele Zwilling: SYSTEM, PCP Other Clinician: Referring Dera Vanaken: Treating Camauri Craton/Extender: Steve Richards: 0 Edema Assessment Assessed: [Left: No] [Right: No] E[Left: dema] [Right: :] Calf Left: Right: Point of Measurement: 41 cm From Medial Instep cm 42 cm Ankle Left: Right: Point of Measurement: 16 cm From Medial Instep cm 29 cm Vascular Assessment Pulses: Dorsalis Pedis Palpable: [Left:Yes] [Right:Yes] Blood Pressure: Brachial: [Left:121] [Right:121] Ankle: [Left:Dorsalis Pedis: 142 1.17] [Right:Dorsalis Pedis: 150 1.24] Electronic Signature(s) Signed: 08/19/2019 4:55:38 PM By: Steve Richards Entered By: Steve Richards on 08/19/2019 13:59:02 -------------------------------------------------------------------------------- Pain Assessment Details Patient Name: Date of Service: [name], [facility] MA S 08/19/2019 1:15 PM Medical Record Number: [id number] Patient Account Number: [id number] Date of Birth/Sex: Treating RN: Dec 26, 1965 (54 y.o. Male) Steve Richards Primary Care Loyalty Arentz: SYSTEM, PCP Other Clinician: Referring Mischele Detter: Treating Neilani Duffee/Extender: Steve Richards: 0 Active Problems Location of Pain Severity and Description of Pain Patient Has Paino No Site Locations Pain Management and Medication Current Pain Management: Electronic Signature(s) Signed: 08/19/2019  4:55:38 PM By: Steve Richards Entered By: Steve Richards on 08/19/2019 14:01:24 -------------------------------------------------------------------------------- Patient/Caregiver Education Details Patient Name: Date of Service: [name], Shannan Harper MA S 6/16/2021andnbsp1:15 PM Medical Record Number: [id number] Patient Account Number: [id number] Date of Birth/Gender: Treating RN: Aug 09, 1965 (54 y.o. Male) Steve Richards Primary Care Physician: SYSTEM, PCP Other Clinician: Referring Physician: Treating Physician/Extender: Steve Richards: 0 Education Assessment Education Provided To: Patient Education Topics Provided Venous: Handouts: Controlling Swelling with Compression Stockings , Managing Venous Disease and Related Ulcers Methods: Explain/Verbal, Printed Responses: Reinforcements needed, State content correctly Wound/Skin Impairment: Handouts: Caring for Your  Ulcer, Skin Care Do's and Dont's Methods: Explain/Verbal, Printed Responses: Reinforcements needed, State content correctly Electronic Signature(s) Signed: 08/20/2019 5:22:52 PM By: Steve Gouty RN, BSN Entered By: Steve Richards on 08/19/2019 14:22:10 -------------------------------------------------------------------------------- Vitals Details Patient Name: Date of Service: [name], [place] MA S 08/19/2019 1:15 PM Medical Record Number: [id number] Patient Account Number: [id number] Date of Birth/Sex: Treating RN: 26-Feb-1966 (54 y.o. Male) Steve Richards Primary Care Ladaisha Portillo: SYSTEM, PCP Other Clinician: Referring Kimberley Speece: Treating Nickey Canedo/Extender: Steve Richards: 0 Vital Signs Time Taken: 13:30 Temperature (F): 98 Height (in): 72 Pulse (bpm): 78 Source: Stated Respiratory Rate (breaths/min): 18 Weight (lbs): 225 Blood Pressure (mmHg): 121/75 Source: Stated Reference Range: 80 - 120 mg / dl Body Mass Index (BMI): 30.5 Electronic Signature(s) Signed: 08/19/2019  4:55:38 PM By: Steve Richards Entered By: Steve Richards on 08/19/2019 13:32:36

## 2019-08-20 NOTE — Progress Notes (Signed)
Steve Richards ([id number]) , Visit Report for 08/19/2019 Chief Complaint Document Details Patient Name: Date of Service: DO Steve Richards [place] 08/19/2019 1:15 PM Medical Record Number: [id number] Patient Account Number: [id number] Date of Birth/Sex: Treating RN: 1965/04/01 (54 y.o. Male) Steve Richards Primary Care Provider: SYSTEM, PCP Other Clinician: Referring Provider: Treating Provider/Extender: [name] in Treatment: 0 Information Obtained from: Patient Chief Complaint Right LE Ulcer Electronic Signature(s) Signed: 08/19/2019 2:26:10 PM By: Steve Keeler PA-C Entered By: Steve Richards on 08/19/2019 14:26:09 -------------------------------------------------------------------------------- HPI Details Patient Name: Date of Service: [name], [name] 08/19/2019 1:15 PM Medical Record Number: [id number] Patient Account Number: [id number] Date of Birth/Sex: Treating RN: 1965-09-17 (54 y.o. Male) Steve Richards Primary Care Provider: SYSTEM, PCP Other Clinician: Referring Provider: Treating Provider/Extender: [name] in Treatment: 0 History of Present Illness HPI Description: 08/19/2019 upon evaluation today patient actually appears for evaluation in the clinic after having had a aortic valve replacement surgery for which he just got to the hospital yesterday. He does have bilateral lower extremity lymphedema with chronic skin changes. He has swelling of his legs chronically and has had a lot of ulcers in these areas as well. With that being said he was scheduled once before to come see [place] but did not make it in for that appointment because it ended up being something with his heart going on at that time. With that being said the patient has a wound on the right leg at this point or at least initially thought so we did evaluate this thoroughly to be peripherally honest I do not feel like that is actually an open wound at this time based on what I am seeing but it does  look like it was something that he did have there very recently. He does want to see about getting compression stockings and I think we can definitely work on that for him today as well. Electronic Signature(s) Signed: 08/19/2019 4:57:06 PM By: Steve Keeler PA-C Entered By: Steve Richards on 08/19/2019 16:57:06 -------------------------------------------------------------------------------- Physical Exam Details Patient Name: Date of Service: [name], [name] 08/19/2019 1:15 PM Medical Record Number: [id number] Patient Account Number: [id number] Date of Birth/Sex: Treating RN: [birth year] (54 y.o. Male) Steve Richards Primary Care Provider: SYSTEM, PCP Other Clinician: Referring Provider: Treating Provider/Extender: [name] in Treatment: 0 Constitutional sitting or standing blood pressure is within target range for patient.. supine blood pressure is within target range for patient.Marland Kitchen respirations regular, non-labored and within target range for patient.Marland Kitchen temperature within target range for patient.. Well-nourished and well-hydrated in no acute distress. Eyes conjunctiva clear no eyelid edema noted. pupils equal round and reactive to light and accommodation. Ears, Nose, Mouth, and Throat no gross abnormality of ear auricles or external auditory canals. normal hearing noted during conversation. mucus membranes moist. Respiratory normal breathing without difficulty. Cardiovascular 2+ dorsalis pedis/posterior tibialis pulses. 2+ pitting edema of the bilateral lower extremities. Musculoskeletal normal gait and posture. no significant deformity or arthritic changes, no loss or range of motion, no clubbing. Psychiatric this patient is able to make decisions and demonstrates good insight into disease process. Alert and Oriented x 3. pleasant and cooperative. Notes Upon inspection patient's wounds actually all appear to be healed in my opinion what he has on the right leg was  actually due to something he bumped and hit yesterday and this actually appears to be pretty much sealed up without any complications at all. The left leg  which looking back at the records appears to been the main issue actually appears to be doing much better and I really do not see any evidence of infection at this time nor anything that is open and I overall feel like the patient is doing quite well in that regard as well. Electronic Signature(s) Signed: 08/19/2019 4:57:41 PM By: Steve Keeler PA-C Entered By: Steve Richards on 08/19/2019 16:57:40 -------------------------------------------------------------------------------- Physician Orders Details Patient Name: Date of Service: [name], [name] 08/19/2019 1:15 PM Medical Record Number: [id number] Patient Account Number: [id number] Date of Birth/Sex: Treating RN: 04-19-1965 (54 y.o. Male) Steve Richards Primary Care Provider: SYSTEM, PCP Other Clinician: Referring Provider: Treating Provider/Extender: [name] in Treatment: 0 Verbal / Phone Orders: No Diagnosis Coding ICD-10 Coding Code Description S81.801A Unspecified open wound, right lower leg, initial encounter L97.812 Non-pressure chronic ulcer of other part of right lower leg with fat layer exposed Z95.4 Presence of other heart-valve replacement Discharge From [facility] Services Discharge from [place] Number One lotion - both legs nightly Edema Control Avoid standing for long periods of time Elevate legs to the level of the heart or above for 30 minutes daily and/or when sitting, a frequency of: - throughout the day Exercise regularly Support Garment 20-30 mm/Hg pressure to: - compression stockings Electronic Signature(s) Signed: 08/20/2019 1:39:37 PM By: Steve Keeler PA-C Signed: 08/20/2019 5:22:52 PM By: Steve Gouty RN, BSN Entered By: Steve Richards on 08/19/2019  14:37:12 -------------------------------------------------------------------------------- Problem List Details Patient Name: Date of Service: [name], [name] 08/19/2019 1:15 PM Medical Record Number: [id number] Patient Account Number: [id number] Date of Birth/Sex: Treating RN: November 22, 1965 (54 y.o. Male) Steve Richards Primary Care Provider: SYSTEM, PCP Other Clinician: Referring Provider: Treating Provider/Extender: [name] in Treatment: 0 Active Problems ICD-10 Encounter Code Description Active Date MDM Diagnosis S81.801A Unspecified open wound, right lower leg, initial encounter 08/19/2019 No Yes L97.812 Non-pressure chronic ulcer of other part of right lower leg with fat layer 08/19/2019 No Yes exposed Z95.4 Presence of other heart-valve replacement 08/19/2019 No Yes I89.0 Lymphedema, not elsewhere classified 08/19/2019 No Yes Inactive Problems Resolved Problems Electronic Signature(s) Signed: 08/19/2019 2:40:53 PM By: Steve Keeler PA-C Previous Signature: 08/19/2019 2:25:40 PM Version By: Steve Keeler PA-C Entered By: Steve Richards on 08/19/2019 14:40:52 -------------------------------------------------------------------------------- Progress Note Details Patient Name: Date of Service: [name], [name] 08/19/2019 1:15 PM Medical Record Number: [id number] Patient Account Number: [id number] Date of Birth/Sex: Treating RN: 1965-10-06 (54 y.o. Male) Steve Richards Primary Care Provider: SYSTEM, PCP Other Clinician: Referring Provider: Treating Provider/Extender: [name] in Treatment: 0 Subjective Chief Complaint Information obtained from Patient Right LE Ulcer History of Present Illness (HPI) 08/19/2019 upon evaluation today patient actually appears for evaluation in the clinic after having had a aortic valve replacement surgery for which he just got to the hospital yesterday. He does have bilateral lower extremity lymphedema with chronic skin  changes. He has swelling of his legs chronically and has had a lot of ulcers in these areas as well. With that being said he was scheduled once before to come see [place] but did not make it in for that appointment because it ended up being something with his heart going on at that time. With that being said the patient has a wound on the right leg at this point or at least initially thought so we did evaluate this thoroughly to be peripherally honest I do  not feel like that is actually an open wound at this time based on what I am seeing but it does look like it was something that he did have there very recently. He does want to see about getting compression stockings and I think we can definitely work on that for him today as well. Patient History Information obtained from Patient. Allergies No Known Allergies Family History No family history of Cancer, Diabetes, Heart Disease, Hereditary Spherocytosis, Hypertension, Kidney Disease, Lung Disease, Seizures, Stroke, Thyroid Problems, Tuberculosis. Social History Never smoker, Marital Status - Single, Alcohol Use - Rarely, Drug Use - No History, Caffeine Use - Moderate. Medical History Eyes Denies history of Cataracts, Glaucoma, Optic Neuritis Ear/Nose/Mouth/Throat Denies history of Chronic sinus problems/congestion, Middle ear problems Hematologic/Lymphatic Denies history of Anemia, Hemophilia, Human Immunodeficiency Virus, Lymphedema, Sickle Cell Disease Respiratory Denies history of Aspiration, Asthma, Chronic Obstructive Pulmonary Disease (COPD), Pneumothorax, Sleep Apnea, Tuberculosis Cardiovascular Patient has history of Congestive Heart Failure Denies history of Angina, Arrhythmia, Coronary Artery Disease, Deep Vein Thrombosis, Hypertension, Hypotension, Myocardial Infarction, Peripheral Arterial Disease, Peripheral Venous Disease, Phlebitis, Vasculitis Gastrointestinal Patient has history of Cirrhosis Denies history of Colitis,  Crohnoos, Hepatitis A, Hepatitis B, Hepatitis C Endocrine Denies history of Type I Diabetes, Type II Diabetes Genitourinary Denies history of End Stage Renal Disease Immunological Denies history of Lupus Erythematosus, Raynaudoos, Scleroderma Integumentary (Skin) Denies history of History of Burn Musculoskeletal Denies history of Gout, Rheumatoid Arthritis, Osteoarthritis, Osteomyelitis Neurologic Denies history of Dementia, Neuropathy, Quadriplegia, Paraplegia Oncologic Denies history of Received Chemotherapy Psychiatric Denies history of Anorexia/bulimia, Confinement Anxiety Medical A Surgical History Notes nd Respiratory right pleural effusion with SOB Cardiovascular aortic valve replacement 08/06/2019 aortic stenosis Gastrointestinal hepatic cirrhosis ascites Endocrine periods hyperglycemia Integumentary (Skin) wounds to bilateral lower legs Review of Systems (ROS) Constitutional Symptoms (General Health) Denies complaints or symptoms of Fatigue, Fever, Chills, Marked Weight Change. Eyes Denies complaints or symptoms of Dry Eyes, Vision Changes, Glasses / Contacts. Ear/Nose/Mouth/Throat Denies complaints or symptoms of Chronic sinus problems or rhinitis. Respiratory Denies complaints or symptoms of Chronic or frequent coughs, Shortness of Breath. Cardiovascular Denies complaints or symptoms of Chest pain. Gastrointestinal Denies complaints or symptoms of Frequent diarrhea, Nausea, Vomiting. Endocrine Denies complaints or symptoms of Heat/cold intolerance. Genitourinary Denies complaints or symptoms of Frequent urination. Integumentary (Skin) Denies complaints or symptoms of Wounds. Musculoskeletal Denies complaints or symptoms of Muscle Pain, Muscle Weakness. Neurologic Denies complaints or symptoms of Numbness/parasthesias. Psychiatric Denies complaints or symptoms of Claustrophobia, Suicidal. Objective Constitutional sitting or standing blood pressure is  within target range for patient.. supine blood pressure is within target range for patient.Marland Kitchen respirations regular, non-labored and within target range for patient.Marland Kitchen temperature within target range for patient.. Well-nourished and well-hydrated in no acute distress. Vitals Time Taken: 1:30 PM, Height: 72 in, Source: Stated, Weight: 225 lbs, Source: Stated, BMI: 30.5, Temperature: 98 F, Pulse: 78 bpm, Respiratory Rate: 18 breaths/min, Blood Pressure: 121/75 mmHg. Eyes conjunctiva clear no eyelid edema noted. pupils equal round and reactive to light and accommodation. Ears, Nose, Mouth, and Throat no gross abnormality of ear auricles or external auditory canals. normal hearing noted during conversation. mucus membranes moist. Respiratory normal breathing without difficulty. Cardiovascular 2+ dorsalis pedis/posterior tibialis pulses. 2+ pitting edema of the bilateral lower extremities. Musculoskeletal normal gait and posture. no significant deformity or arthritic changes, no loss or range of motion, no clubbing. Psychiatric this patient is able to make decisions and demonstrates good insight into disease process. Alert and Oriented x 3. pleasant  and cooperative. General Notes: Upon inspection patient's wounds actually all appear to be healed in my opinion what he has on the right leg was actually due to something he bumped and hit yesterday and this actually appears to be pretty much sealed up without any complications at all. The left leg which looking back at the records appears to been the main issue actually appears to be doing much better and I really do not see any evidence of infection at this time nor anything that is open and I overall feel like the patient is doing quite well in that regard as well. 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 I would recommend currently that we go ahead and initiate treatment with compression stockings at home believe he needs any wound care at this time and I discussed that with the patient today as well. I do think he needs to have compression stockings in the 20-30 mmHg range and we discussed how this will help him as well he can take this off at night and put lotion on he should wear the stockings during the day. 2. I do believe he needs to exercise regularly I think this will be good for him obviously he is can be having some rehab following the cardiac procedure that I think will be good as well. We will see the patient back for follow-up visit as needed. Electronic Signature(s) Signed: 08/19/2019 4:58:25 PM By: Steve Keeler PA-C Entered By: Steve Richards on 08/19/2019 16:58:24 -------------------------------------------------------------------------------- HxROS Details Patient Name: Date of Service: [name], [name] 08/19/2019 1:15 PM Medical Record Number: [id number] Patient Account Number: [id number] Date of Birth/Sex: Treating RN: 1966/01/02 (54 y.o. Male) [name] Primary Care Provider: SYSTEM, PCP Other Clinician: Referring Provider: Treating Provider/Extender: [name] in Treatment: 0 Information Obtained From Patient Constitutional Symptoms (General Health) Complaints and Symptoms: Negative for: Fatigue; Fever; Chills; Marked Weight Change Eyes Complaints and Symptoms: Negative  for: Dry Eyes; Vision Changes; Glasses / Contacts Medical History: Negative for: Cataracts; Glaucoma; Optic Neuritis Ear/Nose/Mouth/Throat Complaints and Symptoms: Negative for: Chronic sinus problems or rhinitis Medical History: Negative for: Chronic sinus problems/congestion; Middle ear problems Respiratory Complaints and Symptoms: Negative for: Chronic or frequent coughs; Shortness of Breath Medical History: Negative for: Aspiration; Asthma; Chronic Obstructive Pulmonary Disease (COPD); Pneumothorax; Sleep Apnea; Tuberculosis Past Medical History Notes: right pleural effusion with SOB Cardiovascular Complaints and Symptoms: Negative for: Chest pain Medical History: Positive for: Congestive Heart Failure Negative for: Angina; Arrhythmia; Coronary Artery Disease; Deep Vein Thrombosis; Hypertension; Hypotension; Myocardial Infarction; Peripheral Arterial Disease; Peripheral Venous Disease; Phlebitis; Vasculitis Past Medical History Notes: aortic valve replacement 08/06/2019 aortic stenosis Gastrointestinal Complaints and Symptoms: Negative for: Frequent diarrhea; Nausea; Vomiting Medical History: Positive for: Cirrhosis Negative for: Colitis; Crohns; Hepatitis A; Hepatitis B; Hepatitis C Past Medical History Notes: hepatic cirrhosis ascites Endocrine Complaints and Symptoms: Negative for: Heat/cold intolerance Medical History: Negative for: Type I Diabetes; Type II Diabetes Past  Medical History Notes: periods hyperglycemia Genitourinary Complaints and Symptoms: Negative for: Frequent urination Medical History: Negative for: End Stage Renal Disease Integumentary (Skin) Complaints and Symptoms: Negative for: Wounds Medical History: Negative for: History of Burn Past Medical History Notes: wounds to bilateral lower legs Musculoskeletal Complaints and Symptoms: Negative for: Muscle Pain; Muscle Weakness Medical History: Negative for: Gout; Rheumatoid Arthritis;  Osteoarthritis; Osteomyelitis Neurologic Complaints and Symptoms: Negative for: Numbness/parasthesias Medical History: Negative for: Dementia; Neuropathy; Quadriplegia; Paraplegia Psychiatric Complaints and Symptoms: Negative for: Claustrophobia; Suicidal Medical History: Negative for: Anorexia/bulimia; Confinement Anxiety Hematologic/Lymphatic Medical History: Negative for: Anemia; Hemophilia; Human Immunodeficiency Virus; Lymphedema; Sickle Cell Disease Immunological Medical History: Negative for: Lupus Erythematosus; Raynauds; Scleroderma Oncologic Medical History: Negative for: Received Chemotherapy Immunizations Pneumococcal Vaccine: Received Pneumococcal Vaccination: No Implantable Devices None Family and Social History Cancer: No; Diabetes: No; Heart Disease: No; Hereditary Spherocytosis: No; Hypertension: No; Kidney Disease: No; Lung Disease: No; Seizures: No; Stroke: No; Thyroid Problems: No; Tuberculosis: No; Never smoker; Marital Status - Single; Alcohol Use: Rarely; Drug Use: No History; Caffeine Use: Moderate; Financial Concerns: No; Food, Clothing or Shelter Needs: No; Support System Lacking: No; Transportation Concerns: No Electronic Signature(s) Signed: 08/19/2019 4:55:38 PM By: [name] Signed: 08/20/2019 1:39:37 PM By: Steve Keeler PA-C Entered By: [name] on 08/19/2019 13:46:27 -------------------------------------------------------------------------------- SuperBill Details Patient Name: Date of Service: [name], [name] 08/19/2019 Medical Record Number: [id number] Patient Account Number: [id number] Date of Birth/Sex: Treating RN: Dec 02, 1965 (54 y.o. Male) Steve Richards Primary Care Provider: SYSTEM, PCP Other Clinician: Referring Provider: Treating Provider/Extender: [name] in Treatment: 0 Diagnosis Coding ICD-10 Codes Code Description S81.801A Unspecified open wound, right lower leg, initial encounter L97.812  Non-pressure chronic ulcer of other part of right lower leg with fat layer exposed Z95.4 Presence of other heart-valve replacement I89.0 Lymphedema, not elsewhere classified Facility Procedures CPT4 Code: [id number] Description: 99213 - WOUND CARE VISIT-LEV 3 EST PT Modifier: Quantity: 1 Physician Procedures : CPT4 Code Description Modifier [id number] [place] PHYS LEVEL 3 NEW PT ICD-10 Diagnosis Description S81.801A Unspecified open wound, right lower leg, initial encounter R56.153 Non-pressure chronic ulcer of other part of right lower leg with fat layer exposed  Z95.4 Presence of other heart-valve replacement I89.0 Lymphedema, not elsewhere classified Quantity: 1 Electronic Signature(s) Signed: 08/19/2019 4:58:51 PM By: Steve Keeler PA-C Entered By: Steve Richards on 08/19/2019 16:58:50

## 2019-08-21 ENCOUNTER — Ambulatory Visit (INDEPENDENT_AMBULATORY_CARE_PROVIDER_SITE_OTHER): Payer: Self-pay

## 2019-08-21 ENCOUNTER — Other Ambulatory Visit: Payer: Self-pay

## 2019-08-21 DIAGNOSIS — Z4802 Encounter for removal of sutures: Secondary | ICD-10-CM

## 2019-08-21 NOTE — Progress Notes (Signed)
Patient arrived for nurse visit to remove 4 sutures mid-abdomen post- procedure AVR with Dr. Cyndia Bent 08/06/19.  Sutures removed with no signs/ symptoms of infection noted.  Incision to left side of abdomen did dehisce slightly, seemed superficial.  Advised he could clean it with peroxide and betadine if needed and that it may drain a clear substance.  If that was to happen he should call the office with any signs/ symptoms of infection. Patient tolerated procedure well.  Patient instructed to keep the incision sites clean and dry.  Patient acknowledged instructions given.  He is to return for his follow-up appointment with Dr. Cyndia Bent which he is aware of.

## 2019-08-21 NOTE — Addendum Note (Signed)
Addended by: Charlena Cross F on: 08/21/2019 11:37 AM   Modules accepted: Orders

## 2019-08-24 ENCOUNTER — Ambulatory Visit: Payer: 59 | Admitting: Surgery

## 2019-08-24 ENCOUNTER — Ambulatory Visit: Payer: 59

## 2019-08-25 ENCOUNTER — Encounter: Payer: Self-pay | Admitting: Physician Assistant

## 2019-08-25 ENCOUNTER — Ambulatory Visit (INDEPENDENT_AMBULATORY_CARE_PROVIDER_SITE_OTHER): Payer: 59 | Admitting: Physician Assistant

## 2019-08-25 ENCOUNTER — Other Ambulatory Visit: Payer: Self-pay

## 2019-08-25 VITALS — BP 112/70 | HR 71 | Ht 72.0 in | Wt 224.0 lb

## 2019-08-25 DIAGNOSIS — G5621 Lesion of ulnar nerve, right upper limb: Secondary | ICD-10-CM

## 2019-08-25 DIAGNOSIS — I5042 Chronic combined systolic (congestive) and diastolic (congestive) heart failure: Secondary | ICD-10-CM | POA: Diagnosis not present

## 2019-08-25 DIAGNOSIS — I350 Nonrheumatic aortic (valve) stenosis: Secondary | ICD-10-CM | POA: Diagnosis not present

## 2019-08-25 DIAGNOSIS — Z952 Presence of prosthetic heart valve: Secondary | ICD-10-CM

## 2019-08-25 DIAGNOSIS — K7460 Unspecified cirrhosis of liver: Secondary | ICD-10-CM | POA: Diagnosis not present

## 2019-08-25 MED ORDER — AMOXICILLIN 500 MG PO CAPS
ORAL_CAPSULE | ORAL | 2 refills | Status: DC
Start: 2019-08-25 — End: 2020-04-18

## 2019-08-25 NOTE — Patient Instructions (Signed)
Medication Instructions:   Your physician has recommended you make the following change in your medication:   1) Take Amoxicillin 500 mg, 4 capsules by mouth 30-60 hours prior to dental procedures  *If you need a refill on your cardiac medications before your next appointment, please call your pharmacy*  Lab Work:  You will have labs drawn today: BMET  If you have labs (blood work) drawn today and your tests are completely normal, you will receive your results only by: Marland Kitchen MyChart Message (if you have MyChart) OR . A paper copy in the mail If you have any lab test that is abnormal or we need to change your treatment, we will call you to review the results.  Testing/Procedures:  Your physician has requested that you have an echocardiogram 8 weeks from today. Echocardiography is a painless test that uses sound waves to create images of your heart. It provides your doctor with information about the size and shape of your heart and how well your heart's chambers and valves are working. This procedure takes approximately one hour. There are no restrictions for this procedure.  Follow-Up: At [facility], you and your health needs are our priority.  As part of our continuing mission to provide you with exceptional heart care, we have created designated Provider Care Teams.  These Care Teams include your primary Cardiologist (physician) and Advanced Practice Providers (APPs -  Physician Assistants and Nurse Practitioners) who all work together to provide you with the care you need, when you need it.  We recommend signing up for the patient portal called "MyChart".  Sign up information is provided on this After Visit Summary.  MyChart is used to connect with patients for Virtual Visits (Telemedicine).  Patients are able to view lab/test results, encounter notes, upcoming appointments, etc.  Non-urgent messages can be sent to your provider as well.   To learn more about what you can do with MyChart,  go to [email or web address].    Your next appointment:   4 week(s)  The format for your next appointment:   In Person  Provider:   Candee Furbish, MD

## 2019-08-25 NOTE — Progress Notes (Signed)
Cardiology Office Note:    Date:  08/25/2019   ID:  Trell Secrist, DOB [birth year], MRN [id number]  PCP:  Rutherford Guys, MD  Cardiologist:  Candee Furbish, MD  Electrophysiologist:  None   Referring MD: No ref. provider found   Chief Complaint:  Hospitalization Follow-up (s/p AVR)    Patient Profile:    Steve Richards is a 54 y.o. male with:   Aortic stenosis  S/p bioprosthetic AVR 11/5282  Systolic/Diastolic CHF  Non-ischemic cardiomyopathy   Cath August 11, 2019: no CAD  Echocardiogram 2019-08-11: EF 40-45, Gr 2 DD  Hepatic cirrhosis   Hx of COVID-19 Aug 11, 2019  Obesity   Pleural effusion  S/p thoracentesis 06/2019  Prior CV studies: Cardiac catheterization 08-11-19 Normal coronary arteries Critical aortic stenosis (mean gradient 57, peak 108, calculated AVA 0.6 cm)  Carotid US 07/25/2019 No significant ICA stenosis bilaterally  Echocardiogram 07/21/2019 EF 40-45, global HK, GRII DD, mildly reduced RV SF, moderately elevated RVSP (59.9), moderate LAE, trivial MR, mild AI, critical aortic stenosis (mean gradient 96.2), moderate R pleural effusion  History of Present Illness:    Steve Richards returns for post hospital follow up after recent aortic valve replacement.  He was admitted 5/18-6/14 with acute congestive heart failure.  He was incidentally noted to be + for SARS-CoV-2.  He did not have severe disease and did not require Remdesivir or Steroids.  Echocardiogram demonstrated critical AS with EF 40-45.  Cardiac catheterization demonstrated no CAD.  He underwent bioprosthetic AVR with Dr. Cyndia Bent.  Post op course was notable for post op bleeding.  He required redo median sternotomy for evacuation of blood and clots and ligation of the bleeding site.  It was initially recommended he be DC to inpatient rehab.  This was ultimately denied and he was DC to home with home health.      He returns for follow-up.  He is here alone.  Since discharge from the hospital, he has been doing well.  He  had some numbness on the left side of his tongue after the ET tube was removed.  This is gradually improved.  He also had some nerve impingement on the right that was present prior to his admission to the hospital.  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  His right arm symptoms are slightly improved.  His breathing has been stable.  His weights have been stable.  His chest remains sore.  He has not had syncope or near syncope.  He has not had fevers, chills, cough.  His lower extremity swelling is improved.  He followed up with the wound center.  His wounds on the left leg are healed.  Past Medical History:  Diagnosis Date  . Aortic valve stenosis 08/06/2019  . Ascites 07/15/2019  . Cellulitis and abscess of left lower extremity 07/21/2019  . Chronic combined systolic (congestive) and diastolic (congestive) heart failure ([place])   . Cirrhosis of liver with ascites ([place])   . Critical aortic valve stenosis   . Hepatic cirrhosis ([place]) 07/15/2019  . Hyperglycemia 07/21/2019  . Left kidney mass 07/15/2019  . Morbid obesity ([place])   . New onset of congestive heart failure ([place]) 07/21/2019  . Pleural effusion   . Pleural effusion on right 07/15/2019  . S/P aortic valve replacement with bioprosthetic valve 08/06/2019   Size 25 mm  . S/P AVR 08/07/2019  . Severe aortic stenosis   . Splenomegaly 07/15/2019  . Systolic ejection murmur 1/32/4401    Current Medications: Current Meds  Medication Sig  . acetaminophen (TYLENOL) 325 MG tablet Take 2 tablets (650 mg total) by mouth every 6 (six) hours as needed for mild pain.  . Amino Acids-Protein Hydrolys (FEEDING SUPPLEMENT, PRO-STAT SUGAR FREE 64,) LIQD Take 30 mLs by mouth daily.  Marland Kitchen aspirin EC 325 MG EC tablet Take 1 tablet (325 mg total) by mouth daily.  . carvedilol (COREG) 3.125 MG tablet Take 1 tablet (3.125 mg total) by mouth 2 (two) times daily with a meal.  . feeding supplement, ENSURE ENLIVE, (ENSURE ENLIVE) LIQD Take 237 mLs by mouth 2 (two)  times daily between meals.  . furosemide (LASIX) 40 MG tablet Take 40 mg by mouth daily.  Marland Kitchen levothyroxine (SYNTHROID) 25 MCG tablet Take 1 tablet (25 mcg total) by mouth daily at 6 (six) AM.  . metFORMIN (GLUCOPHAGE) 500 MG tablet Take 1 tablet (500 mg total) by mouth 2 (two) times daily with a meal.  . Multiple Vitamin (MULTIVITAMIN) tablet Take 1 tablet by mouth daily.     Allergies:   Patient has no known allergies.   Social History   Tobacco Use  . Smoking status: Never Smoker  . Smokeless tobacco: Never Used  Substance Use Topics  . Alcohol use: Yes    Comment: rarely  . Drug use: Never     Family Hx: The patient's family history includes Osteoporosis in his mother; Other in his father.  ROS   EKGs/Labs/Other Test Reviewed:    EKG:  EKG is  ordered today.  The ekg ordered today demonstrates normal sinus rhythm, heart rate 71, normal axis, inferolateral T wave inversions, LVH, QTC 432  Recent Labs: 07/21/2019: B Natriuretic Peptide 1,142.6; TSH 14.146 08/06/2019: ALT 20; Magnesium 2.0 08/12/2019: Hemoglobin 8.9; Platelets 176 08/15/2019: BUN 17; Creatinine, Ser 0.80; Potassium 3.8; Sodium 134   Recent Lipid Panel Lab Results  Component Value Date/Time   CHOL 128 07/21/2019 03:15 PM   TRIG 87 07/21/2019 03:15 PM   HDL 37 (L) 07/21/2019 03:15 PM   CHOLHDL 3.5 07/21/2019 03:15 PM   LDLCALC 74 07/21/2019 03:15 PM    Physical Exam:    VS:  BP 112/70   Pulse 71   Ht 6' (1.829 m)   Wt 224 lb (101.6 kg)   SpO2 98%   BMI 30.38 kg/m     Wt Readings from Last 3 Encounters:  08/25/19 224 lb (101.6 kg)  08/17/19 221 lb 5.5 oz (100.4 kg)  09/24/11 276 lb (125.2 kg)     Constitutional:      Appearance: Healthy appearance. Not in distress.  Neck:     Thyroid: No thyromegaly.     Vascular: JVD normal.  Pulmonary:     Effort: Pulmonary effort is normal.     Breath sounds: No wheezing. No rales.  Chest:     Comments: Median sternotomy well-healed without erythema or  discharge Cardiovascular:     Normal rate. Regular rhythm. Normal S1. Normal S2.     Murmurs: There is a grade 1/6 early systolic murmur at the URSB.  Edema:    Pretibial: bilateral 1+ edema of the pretibial area.    Comments: L>R Abdominal:     Palpations: Abdomen is soft. There is no hepatomegaly.  Skin:    General: Skin is warm and dry.  Neurological:     General: No focal deficit present.     Mental Status: Alert and oriented to person, place and time.     Cranial Nerves: Cranial nerves are intact.  ASSESSMENT & PLAN:    1. Chronic combined systolic and diastolic CHF (congestive heart failure) (HCC) Nonischemic cardiomyopathy.  Echocardiogram demonstrated EF 40-45 with moderate diastolic dysfunction prior to his AVR.  NYHA II.  Volume status appears stable.  Continue current dose of furosemide for now.  Obtain follow-up BMP today.  Continue current dose of carvedilol.  He is still somewhat deconditioned from his recent prolonged hospitalization and surgery.  We will hold off on starting ACE inhibitor therapy at this time.  Consider starting low-dose lisinopril at follow-up in 4 weeks.  2. Nonrheumatic aortic valve stenosis 3. S/P AVR (aortic valve replacement) As noted, he seems to be progressing well since his aortic valve replacement.  We discussed the importance of SBE prophylaxis.  I have written a prescription for amoxicillin to use prior to dental procedures.  He will be set up for a follow-up echocardiogram in about 8 weeks.  Follow-up with Dr. Cyndia Bent tomorrow as planned.  4. Cirrhosis of liver without ascites, unspecified hepatic cirrhosis type [facility]) He had evidence of possible cirrhosis on prior CT imaging.  Question if this was all hepatic congestion.  Follow-up with primary care.  He may need repeat imaging at some point in the future to reassess.  5. Ulnar neuropathy of right upper extremity This seems to be mildly improved.  I have asked him to follow-up with  primary care.  If he continues to have persistent symptoms, he may need referral to orthopedics.    Dispo:  Return in about 4 weeks (around 09/22/2019) for Routine Follow Up w/ Dr. Marlou Porch, in person.   Medication Adjustments/Labs and Tests Ordered: Current medicines are reviewed at length with the patient today.  Concerns regarding medicines are outlined above.  Tests Ordered: Orders Placed This Encounter  Procedures  . Basic metabolic panel  . EKG 12-Lead  . ECHOCARDIOGRAM COMPLETE   Medication Changes: Meds ordered this encounter  Medications  . amoxicillin (AMOXIL) 500 MG capsule    Sig: Take 4 capsules by mouth 30-60 minutes before dental procedures    Dispense:  4 capsule    Refill:  2    Signed, Richardson Dopp, PA-C  08/25/2019 5:16 PM    [facility] [address], [place], [place]  [address] Phone: [phone]; Fax: [phone]

## 2019-08-26 ENCOUNTER — Ambulatory Visit: Payer: 59

## 2019-08-26 ENCOUNTER — Other Ambulatory Visit: Payer: Self-pay | Admitting: Surgery

## 2019-08-26 DIAGNOSIS — Z952 Presence of prosthetic heart valve: Secondary | ICD-10-CM

## 2019-08-26 LAB — BASIC METABOLIC PANEL
BUN/Creatinine Ratio: 19 (ref 9–20)
BUN: 14 mg/dL (ref 6–24)
CO2: 25 mmol/L (ref 20–29)
Calcium: 9.4 mg/dL (ref 8.7–10.2)
Chloride: 100 mmol/L (ref 96–106)
Creatinine, Ser: 0.72 mg/dL — ABNORMAL LOW (ref 0.76–1.27)
GFR calc Af Amer: 122 mL/min/{1.73_m2} (ref >59)
GFR calc non Af Amer: 106 mL/min/{1.73_m2} (ref >59)
Glucose: 95 mg/dL (ref 65–99)
Potassium: 4.5 mmol/L (ref 3.5–5.2)
Sodium: 141 mmol/L (ref 134–144)

## 2019-08-27 ENCOUNTER — Encounter: Payer: Self-pay | Admitting: Physician Assistant

## 2019-08-27 ENCOUNTER — Other Ambulatory Visit: Payer: Self-pay

## 2019-08-27 ENCOUNTER — Other Ambulatory Visit: Payer: Self-pay | Admitting: Physician Assistant

## 2019-08-27 ENCOUNTER — Ambulatory Visit
Admission: RE | Admit: 2019-08-27 | Discharge: 2019-08-27 | Disposition: A | Discharge status: Home / Self Care | Payer: 59 | Source: Ambulatory Visit | Attending: Surgery | Admitting: Surgery

## 2019-08-27 ENCOUNTER — Ambulatory Visit (INDEPENDENT_AMBULATORY_CARE_PROVIDER_SITE_OTHER): Payer: Self-pay | Admitting: Physician Assistant

## 2019-08-27 VITALS — BP 103/70 | HR 79 | Temp 97.7°F | Resp 20 | Ht 72.0 in | Wt 225.0 lb

## 2019-08-27 DIAGNOSIS — Z952 Presence of prosthetic heart valve: Secondary | ICD-10-CM

## 2019-08-27 DIAGNOSIS — Z953 Presence of xenogenic heart valve: Secondary | ICD-10-CM

## 2019-08-27 MED ORDER — POTASSIUM CHLORIDE ER 20 MEQ PO TBCR: 20.0000 meq | EXTENDED_RELEASE_TABLET | Freq: Every day | ORAL | 1 refills | Status: DC

## 2019-08-27 MED ORDER — FUROSEMIDE 40 MG PO TABS: 40.0000 mg | ORAL_TABLET | Freq: Every day | ORAL | 1 refills | Status: DC

## 2019-08-27 NOTE — Patient Instructions (Signed)
Make every effort to stay physically active, get some type of exercise on a regular basis, and stick to a "heart healthy diet".  The long term benefits for regular exercise and a healthy diet are critically important to your overall health and wellbeing.  You may return to driving an automobile as long as you are no longer requiring oral narcotic pain relievers during the daytime.  It would be wise to start driving only short distances during the daylight and gradually increase from there as you feel comfortable.  You are encouraged to enroll and participate in the outpatient cardiac rehab program beginning as soon as practical.

## 2019-08-27 NOTE — Progress Notes (Signed)
[address]       [place] [address]             [phone]     Steve Richards is a 54 y.o. male patient s/p AVR with Dr. Cyndia Bent. He was taken back to the OR for bleeding and underwent a redo sternotomy. Today he presents for his routine follow-up appointment.    No diagnosis found. Past Medical History:  Diagnosis Date  . Aortic valve stenosis 08/06/2019  . Ascites 07/15/2019  . Cellulitis and abscess of left lower extremity 07/21/2019  . Chronic combined systolic (congestive) and diastolic (congestive) heart failure ([place])   . Cirrhosis of liver with ascites ([place])   . Critical aortic valve stenosis   . Hepatic cirrhosis ([place]) 07/15/2019  . Hyperglycemia 07/21/2019  . Left kidney mass 07/15/2019  . Morbid obesity ([place])   . New onset of congestive heart failure ([place]) 07/21/2019  . Pleural effusion   . Pleural effusion on right 07/15/2019  . S/P aortic valve replacement with bioprosthetic valve 08/06/2019   Size 25 mm  . S/P AVR 08/07/2019  . Severe aortic stenosis   . Splenomegaly 07/15/2019  . Systolic ejection murmur 8/33/8250   No past surgical history pertinent negatives on file. Scheduled Meds: Current Outpatient Medications on File Prior to Visit  Medication Sig Dispense Refill  . acetaminophen (TYLENOL) 325 MG tablet Take 2 tablets (650 mg total) by mouth every 6 (six) hours as needed for mild pain.    . Amino Acids-Protein Hydrolys (FEEDING SUPPLEMENT, PRO-STAT SUGAR FREE 64,) LIQD Take 30 mLs by mouth daily. 887 mL 0  . amoxicillin (AMOXIL) 500 MG capsule Take 4 capsules by mouth 30-60 minutes before dental procedures 4 capsule 2  . aspirin EC 325 MG EC tablet Take 1 tablet (325 mg total) by mouth daily. 30 tablet 0  . carvedilol (COREG) 3.125 MG tablet Take 1 tablet (3.125 mg total) by mouth 2 (two) times daily with a meal. 60 tablet 1  . feeding supplement, ENSURE ENLIVE, (ENSURE ENLIVE) LIQD Take 237 mLs by mouth 2 (two) times daily between meals. 237 mL  12  . furosemide (LASIX) 40 MG tablet Take 40 mg by mouth daily.    Marland Kitchen levothyroxine (SYNTHROID) 25 MCG tablet Take 1 tablet (25 mcg total) by mouth daily at 6 (six) AM. 30 tablet 1  . metFORMIN (GLUCOPHAGE) 500 MG tablet Take 1 tablet (500 mg total) by mouth 2 (two) times daily with a meal. 60 tablet 1  . Multiple Vitamin (MULTIVITAMIN) tablet Take 1 tablet by mouth daily.     No current facility-administered medications on file prior to visit.    Blood pressure 103/70, pulse 79, temperature 97.7 F (36.5 C), temperature source Temporal, resp. rate 20, height 6' (1.829 m), weight 225 lb (102.1 kg), SpO2 97 %.  Subjective Steve Richards is a 54 year old male patient who presents to the clinic for his routine follow-up appointment. He is s/p AVR with Dr. Cyndia Bent.   Objective   Cor: Regular rate and rhythm, no murmur Pulm: Clear to auscultation bilaterally and in all fields Abd: No tenderness Ext: 1+ pitting edema in his lower extremity bilaterally Wound: Clean, dry, and intact  CLINICAL DATA:  Aortic valve replacement  EXAM: CHEST - 2 VIEW  COMPARISON:  08/10/2019  FINDINGS: Interval removal of right-sided PICC line. Interval removal of bilateral chest tubes. Median sternotomy with aortic valve replacement. Stable heart size. Improved aeration of the  bilateral lung fields. Trace right pleural effusion. No pneumothorax.  IMPRESSION: 1. Interval removal of bilateral chest tubes.  No pneumothorax. 2. Improved aeration of the bilateral lung fields. 3. Suspect trace right pleural effusion.   Electronically Signed   By: Davina Poke D.O.   On: 08/27/2019 14:43   Assessment & Plan   Steve Richards is status post aortic valve replacement by Dr. Cyndia Bent.  He is here for his routine follow-up appointment.  Overall, he is doing quite well.  He continues to walk several times a day.  His fluid status is markedly improved from when he was in the hospital on only 40 mg of Lasix daily.  I  did provide a refill for this medication today.  His chest x-ray was unremarkable and this was reviewed with the patient.  His blood pressure was slightly low today on exam however, this has been closed with the patient is running at home when he takes his blood pressure.  He is asymptomatic at this time.  He is in normal sinus rhythm today on exam.  His incisions are all healing well with no issues.  We reviewed all his medications and there were no changes at this time.  He has follow-up with cardiology and has another appointment soon.  He is getting follow-up on his newly diagnosed diabetes mellitus and was placed on Metformin in the hospital.  His primary care provider will be able to titrate his medication and keep track of his blood glucose level.  He also has newly diagnosed hypothyroidism in the hospital and was started on Synthroid.  He is interested in a cardiac rehab program and a referral has been made today.  He is cleared for driving a motor vehicle since he is no longer requiring narcotic pain medication.  Since he had a relatively complicated hospitalization I have made an appointment for him to see Dr. Cyndia Bent in 3 weeks.  All questions were answered to the patient's satisfaction.  He is always welcome to call our office if further questions or concerns arise.  No medication changes were made during this encounter.  I did refill his Lasix prescription so that he has enough medication until his next appointment.  Elgie Collard 08/27/2019

## 2019-08-28 NOTE — Telephone Encounter (Signed)
Called patient to see if he was interested in participating in the Cardiac Rehab Program. Patient stated yes. Patient will come in for orientation on 10/01/19 @ 130PM and will attend the 315PM exercise class.  Mailed letter.

## 2019-09-02 ENCOUNTER — Ambulatory Visit: Payer: 59 | Admitting: Surgery

## 2019-09-10 ENCOUNTER — Other Ambulatory Visit: Payer: Self-pay

## 2019-09-10 ENCOUNTER — Ambulatory Visit: Payer: 59 | Admitting: Family Medicine

## 2019-09-10 ENCOUNTER — Encounter: Payer: Self-pay | Admitting: Family Medicine

## 2019-09-10 VITALS — BP 133/83 | HR 84 | Temp 98.2°F | Ht 72.0 in | Wt 229.6 lb

## 2019-09-10 DIAGNOSIS — R739 Hyperglycemia, unspecified: Secondary | ICD-10-CM | POA: Diagnosis not present

## 2019-09-10 DIAGNOSIS — E039 Hypothyroidism, unspecified: Secondary | ICD-10-CM

## 2019-09-10 DIAGNOSIS — Z953 Presence of xenogenic heart valve: Secondary | ICD-10-CM

## 2019-09-10 MED ORDER — LEVOTHYROXINE SODIUM 25 MCG PO TABS: 25.0000 ug | ORAL_TABLET | Freq: Every day | ORAL | 4 refills | Status: DC

## 2019-09-10 MED ORDER — METFORMIN HCL 500 MG PO TABS: 500.0000 mg | ORAL_TABLET | Freq: Two times a day (BID) | ORAL | 4 refills | Status: DC

## 2019-09-10 NOTE — Progress Notes (Signed)
7/8/20218:59 AM  Steve Richards 1965/06/28, 54 y.o., male [id number]  Chief Complaint  Patient presents with  . New Patient (Initial Visit)    HPI:   Patient is a 54 y.o. male with past medical history significant for AS s/p AVR, combined CHF, liver cirrhosis, hypothyroidism, prediabetes,  who presents today to establish care  hosp may 2021 for cellulitis and edema, severe AS, AVR done, CHF and cirrhosis thought to be 2/2 AS, workups otherwise neg, new diagnosis of hypothyroidism and prediabetes Last cards and cardiac CT OV June 2021 - doing well  He is feeling well, 5 weeks post op He feels that his edema/fluid status is doing well He is working towards losing weight Walking about a mile and half daily Uses compression stockings as also has varicose veins Working on getting more strength on his legs Has completed HH, will start cardiac PT soon Back to driving per cardiac CT Works as massage therapy Continues to have some right ulnar neuropathy since surgery, affects grip, getting better  Lab Results  Component Value Date   HGBA1C 6.1 (H) 08/06/2019   HGBA1C 6.1 (H) 07/21/2019   Lab Results  Component Value Date   LDLCALC 74 07/21/2019   CREATININE 0.72 (L) 08/25/2019   Lab Results  Component Value Date   ALT 20 08/06/2019   AST 28 08/06/2019   ALKPHOS 250 (H) 08/06/2019   BILITOT 2.8 (H) 08/06/2019   Lab Results  Component Value Date   TSH 14.146 (H) 07/21/2019    No flowsheet data found.  Fall Risk  09/10/2019  Falls in the past year? 0  Number falls in past yr: 0  Injury with Fall? 0     No Known Allergies  Prior to Admission medications   Medication Sig Start Date End Date Taking? Authorizing Provider  acetaminophen (TYLENOL) 325 MG tablet Take 2 tablets (650 mg total) by mouth every 6 (six) hours as needed for mild pain. 08/17/19  Yes Conte, Tessa N, PA-C  Amino Acids-Protein Hydrolys (FEEDING SUPPLEMENT, PRO-STAT SUGAR FREE 64,) LIQD Take 30 mLs by  mouth daily. 08/18/19  Yes Harriet Pho, Tessa N, PA-C  amoxicillin (AMOXIL) 500 MG capsule Take 4 capsules by mouth 30-60 minutes before dental procedures 08/25/19  Yes Richardson Dopp T, PA-C  aspirin EC 325 MG EC tablet Take 1 tablet (325 mg total) by mouth daily. 08/18/19  Yes Harriet Pho, Tessa N, PA-C  carvedilol (COREG) 3.125 MG tablet Take 1 tablet (3.125 mg total) by mouth 2 (two) times daily with a meal. 08/17/19  Yes Conte, Tessa N, PA-C  feeding supplement, ENSURE ENLIVE, (ENSURE ENLIVE) LIQD Take 237 mLs by mouth 2 (two) times daily between meals. 08/17/19  Yes Conte, Tessa N, PA-C  furosemide (LASIX) 40 MG tablet Take 1 tablet (40 mg total) by mouth daily. 08/27/19  Yes Elgie Collard, PA-C  levothyroxine (SYNTHROID) 25 MCG tablet Take 1 tablet (25 mcg total) by mouth daily at 6 (six) AM. 08/18/19  Yes Harriet Pho, Tessa N, PA-C  metFORMIN (GLUCOPHAGE) 500 MG tablet Take 1 tablet (500 mg total) by mouth 2 (two) times daily with a meal. 08/17/19  Yes Harriet Pho, Tessa N, PA-C  Multiple Vitamin (MULTIVITAMIN) tablet Take 1 tablet by mouth daily.   Yes [provider]    Past Medical History:  Diagnosis Date  . Aortic valve stenosis 08/06/2019  . Ascites 07/15/2019  . Cellulitis and abscess of left lower extremity 07/21/2019  . Chronic combined systolic (congestive) and diastolic (congestive) heart failure (  [place])   . Cirrhosis of liver with ascites ([place])   . Critical aortic valve stenosis   . Hepatic cirrhosis ([place]) 07/15/2019  . Hyperglycemia 07/21/2019  . Left kidney mass 07/15/2019  . Morbid obesity ([place])   . New onset of congestive heart failure ([place]) 07/21/2019  . Pleural effusion   . Pleural effusion on right 07/15/2019  . S/P aortic valve replacement with bioprosthetic valve 08/06/2019   Size 25 mm  . S/P AVR 08/07/2019  . Severe aortic stenosis   . Splenomegaly 07/15/2019  . Systolic ejection murmur 4/53/6468    Past Surgical History:  Procedure Laterality Date  . AORTIC VALVE REPLACEMENT N/A 08/06/2019    Procedure: AORTIC VALVE REPLACEMENT (AVR) using Margaretha Sheffield Resilia 25 MM Aortic Valve.;  Surgeon: Gaye Pollack, MD;  Location: [facility] OR;  Service: Open Heart Surgery;  Laterality: N/A;  . EXPLORATION POST OPERATIVE OPEN HEART N/A 08/06/2019   Procedure: EXPLORATION POST OPERATIVE OPEN HEART;  Surgeon: Gaye Pollack, MD;  Location: [facility];  Service: Open Heart Surgery;  Laterality: N/A;  . IR THORACENTESIS ASP PLEURAL SPACE W/IMG GUIDE  06/25/2019  . RIGHT/LEFT HEART CATH AND CORONARY ANGIOGRAPHY N/A 07/28/2019   Procedure: RIGHT/LEFT HEART CATH AND CORONARY ANGIOGRAPHY;  Surgeon: Sherren Mocha, MD;  Location: [facility];  Service: Cardiovascular;  Laterality: N/A;  . TEE WITHOUT CARDIOVERSION N/A 08/06/2019   Procedure: TRANSESOPHAGEAL ECHOCARDIOGRAM (TEE);  Surgeon: Gaye Pollack, MD;  Location: [facility];  Service: Open Heart Surgery;  Laterality: N/A;    Social History   Tobacco Use  . Smoking status: Never Smoker  . Smokeless tobacco: Never Used  Substance Use Topics  . Alcohol use: Yes    Comment: rarely    Family History  Problem Relation Age of Onset  . Osteoporosis Mother   . Other Father        died in a hurricane    Review of Systems  Constitutional: Negative for chills and fever.  Respiratory: Negative for cough and shortness of breath.   Cardiovascular: Negative for chest pain, palpitations and leg swelling.  Gastrointestinal: Negative for abdominal pain, nausea and vomiting.     OBJECTIVE:  Today's Vitals   09/10/19 0840  BP: 133/83  Pulse: 84  Temp: 98.2 F (36.8 C)  SpO2: 98%  Weight: 229 lb 9.6 oz (104.1 kg)  Height: 6' (1.829 m)   Body mass index is 31.14 kg/m.   Physical Exam Vitals and nursing note reviewed.  Constitutional:      Appearance: He is well-developed.  HENT:     Head: Normocephalic and atraumatic.  Eyes:     Conjunctiva/sclera: Conjunctivae normal.     Pupils: Pupils are equal, round, and reactive to light.    Cardiovascular:     Rate and Rhythm: Normal rate and regular rhythm.     Heart sounds: No murmur heard.  No friction rub. No gallop.   Pulmonary:     Effort: Pulmonary effort is normal.     Breath sounds: Normal breath sounds. No wheezing or rales.  Musculoskeletal:     Cervical back: Neck supple.     Right lower leg: No edema.     Left lower leg: No edema.  Skin:    General: Skin is warm and dry.  Neurological:     Mental Status: He is alert and oriented to person, place, and time.     No results found for this or any previous visit (from the past 24  hour(s)).  No results found.   ASSESSMENT and PLAN  1. S/P aortic valve replacement with bioprosthetic valve Managed by cards and CT surg, recovering well. mild sequela from surgery that is improving.   2. Hyperglycemia Cont metformin and working on LFM, recheck labs prior to next OV - Hemoglobin A1c; Future - Comprehensive metabolic panel; Future  3. Hypothyroidism, unspecified type Cont levothyroxine, recheck TSH prior to next OV - TSH; Future  Other orders - levothyroxine (SYNTHROID) 25 MCG tablet; Take 1 tablet (25 mcg total) by mouth daily at 6 (six) AM. - metFORMIN (GLUCOPHAGE) 500 MG tablet; Take 1 tablet (500 mg total) by mouth 2 (two) times daily with a meal.  Return in about 3 months (around 12/11/2019).    Rutherford Guys, MD [facility] at [facility] [place], [place] [address] Ph.  [phone] Fax [phone]

## 2019-09-10 NOTE — Patient Instructions (Signed)
° ° ° °  If you have lab work done today you will be contacted with your lab results within the next 2 weeks.  If you have not heard from us then please contact us. The fastest way to get your results is to register for My Chart. ° ° °IF you received an x-ray today, you will receive an invoice from [facility]. Please contact [facility] at [phone] with questions or concerns regarding your invoice.  ° °IF you received labwork today, you will receive an invoice from [facility]. Please contact [facility] at [phone] with questions or concerns regarding your invoice.  ° °Our billing staff will not be able to assist you with questions regarding bills from these companies. ° °You will be contacted with the lab results as soon as they are available. The fastest way to get your results is to activate your My Chart account. Instructions are located on the last page of this paperwork. If you have not heard from us regarding the results in 2 weeks, please contact this office. °  ° ° ° °

## 2019-09-16 ENCOUNTER — Other Ambulatory Visit: Payer: Self-pay

## 2019-09-16 ENCOUNTER — Encounter: Payer: Self-pay | Admitting: Surgery

## 2019-09-16 ENCOUNTER — Ambulatory Visit (INDEPENDENT_AMBULATORY_CARE_PROVIDER_SITE_OTHER): Payer: Self-pay | Admitting: Surgery

## 2019-09-16 VITALS — BP 130/80 | HR 82 | Temp 97.7°F | Resp 20 | Ht 72.0 in | Wt 226.0 lb

## 2019-09-16 DIAGNOSIS — Z953 Presence of xenogenic heart valve: Secondary | ICD-10-CM

## 2019-09-16 NOTE — Progress Notes (Signed)
° ° °  HPI: Patient returns for routine postoperative follow-up having undergone aortic valve replacement with a 25 mm pericardial valve on 08/06/2019.  He presented with stage D, critical, symptomatic aortic stenosis with New York Heart Association class IV symptoms. The patient's early postoperative recovery while in the hospital was notable for an uncomplicated postoperative course. Since hospital discharge the patient reports that he has been feeling well.  He has now walking about 1/2 miles per day without chest pain or shortness of breath.  He is now walking about 1/2 miles per day without chest pain or shortness of breath.  He has been watching his diet closely and has lost considerable weight.  His stamina continues to improve.   Current Outpatient Medications  Medication Sig Dispense Refill   acetaminophen (TYLENOL) 325 MG tablet Take 2 tablets (650 mg total) by mouth every 6 (six) hours as needed for mild pain.     amoxicillin (AMOXIL) 500 MG capsule Take 4 capsules by mouth 30-60 minutes before dental procedures 4 capsule 2   aspirin EC 325 MG EC tablet Take 1 tablet (325 mg total) by mouth daily. 30 tablet 0   carvedilol (COREG) 3.125 MG tablet Take 1 tablet (3.125 mg total) by mouth 2 (two) times daily with a meal. 60 tablet 1   furosemide (LASIX) 40 MG tablet Take 1 tablet (40 mg total) by mouth daily. 30 tablet 1   levothyroxine (SYNTHROID) 25 MCG tablet Take 1 tablet (25 mcg total) by mouth daily at 6 (six) AM. 30 tablet 4   metFORMIN (GLUCOPHAGE) 500 MG tablet Take 1 tablet (500 mg total) by mouth 2 (two) times daily with a meal. 60 tablet 4   Multiple Vitamin (MULTIVITAMIN) tablet Take 1 tablet by mouth daily.     Amino Acids-Protein Hydrolys (FEEDING SUPPLEMENT, PRO-STAT SUGAR FREE 64,) LIQD Take 30 mLs by mouth daily. (Patient not taking: Reported on 09/16/2019) 887 mL 0   No current facility-administered medications for this visit.    Physical Exam:  BP 130/80     Pulse 82    Temp 97.7 F (36.5 C) (Skin)    Resp 20    Ht 6' (1.829 m)    Wt 226 lb (102.5 kg)    SpO2 94%    BMI 30.65 kg/m  He looks well. Cardiac exam shows a regular rate and rhythm with normal heart sounds.  There is no murmur. Lungs are clear. The chest incision is healing well and sternum is stable. There is no lower extremity edema.  Diagnostic Tests:  None today  Impression:  He is doing very well 5 weeks following surgery.  I told him the return to driving a car but should refrain from lifting anything heavier than 10 pounds for 3 months postoperatively.  I encouraged him to continue walking as much as possible.  Plan:  He will continue to follow-up with his PCP concerning his newly diagnosed hypothyroidism and diabetes and will follow up with cardiology.  He will return to see me if he has any problems with his incisions.   Gaye Pollack, MD [facility] [phone]

## 2019-09-21 ENCOUNTER — Telehealth (HOSPITAL_COMMUNITY): Payer: Self-pay | Admitting: Pharmacist

## 2019-09-28 ENCOUNTER — Telehealth (HOSPITAL_COMMUNITY): Payer: Self-pay | Admitting: Pharmacist

## 2019-09-28 NOTE — Telephone Encounter (Signed)
Cardiac Rehab Medication Review by a Pharmacist  Does the patient  feel that his/her medications are working for him/her?  yes  Has the patient been experiencing any side effects to the medications prescribed?  no  Does the patient measure his/her own blood pressure or blood glucose at home?  no   Does the patient have any problems obtaining medications due to transportation or finances?   no  Understanding of regimen: good Understanding of indications: good Potential of compliance: good    Pharmacist Intervention: Pt states dental procedure is scheduled for October 2021 and he will take amoxicillin per dentist at that time. He no longer takes the feeding supplement, states "it has been discontinued."  Carolin Guernsey  PGY1 Pharmacy Resident 09/28/2019 9:33 AM

## 2019-09-30 ENCOUNTER — Ambulatory Visit: Payer: 59 | Attending: Family Medicine | Admitting: Family Medicine

## 2019-09-30 ENCOUNTER — Other Ambulatory Visit: Payer: Self-pay

## 2019-10-01 ENCOUNTER — Telehealth (HOSPITAL_COMMUNITY): Payer: Self-pay

## 2019-10-01 ENCOUNTER — Encounter (HOSPITAL_COMMUNITY)
Admission: RE | Admit: 2019-10-01 | Discharge: 2019-10-01 | Disposition: A | Discharge status: Home / Self Care | Payer: 59 | Source: Ambulatory Visit | Attending: Cardiology | Admitting: Cardiology

## 2019-10-01 ENCOUNTER — Telehealth (HOSPITAL_COMMUNITY): Payer: Self-pay | Admitting: *Deleted

## 2019-10-01 VITALS — BP 104/68 | HR 88 | Ht 70.75 in | Wt 228.4 lb

## 2019-10-01 DIAGNOSIS — Z952 Presence of prosthetic heart valve: Secondary | ICD-10-CM | POA: Insufficient documentation

## 2019-10-01 LAB — GLUCOSE, CAPILLARY: Glucose-Capillary: 121 mg/dL — ABNORMAL HIGH (ref 70–99)

## 2019-10-01 NOTE — Telephone Encounter (Signed)
Left message regarding cardiac rehab orientation.

## 2019-10-01 NOTE — Telephone Encounter (Signed)
Telephoned pt to confirm Cardiac Rehab Orientation appt for today 10/01/2019 at 1:30pm. Left VM.  Carma Lair MS, CEP CSCS 9:25 AM 10/01/2019

## 2019-10-01 NOTE — Progress Notes (Signed)
Gershon Mussel is here for orientation at cardiac rehab. Intermittent PVC's noted. Frequent at times.Patient asymptomatic. Vital signs stable.   Roby Lofts [facility] paged and notified. No new orders received. Will fax exercise flow sheets to Dr. Kingsley Plan office for review.Barnet Pall, RN,BSN 10/01/2019 2:48 PM

## 2019-10-02 ENCOUNTER — Encounter (HOSPITAL_COMMUNITY): Payer: Self-pay

## 2019-10-02 NOTE — Progress Notes (Signed)
Cardiac Individual Treatment Plan  Patient Details  Name: Steve Richards MRN: [id number] Date of Birth: 03/21/1965 Referring Provider:     [place] from 10/01/2019 in [place]  Referring Provider Jerline Pain MD      Initial Encounter Date:    CARDIAC REHAB PHASE II ORIENTATION from 10/01/2019 in [place]  Date 10/01/19      Visit Diagnosis: 08/06/19 AVR, Redo Sternotomy for bleeding  Patient's Home Medications on Admission:  Current Outpatient Medications:  .  acetaminophen (TYLENOL) 325 MG tablet, Take 2 tablets (650 mg total) by mouth every 6 (six) hours as needed for mild pain., Disp: , Rfl:  .  amoxicillin (AMOXIL) 500 MG capsule, Take 4 capsules by mouth 30-60 minutes before dental procedures, Disp: 4 capsule, Rfl: 2 .  aspirin EC 325 MG EC tablet, Take 1 tablet (325 mg total) by mouth daily., Disp: 30 tablet, Rfl: 0 .  carvedilol (COREG) 3.125 MG tablet, Take 1 tablet (3.125 mg total) by mouth 2 (two) times daily with a meal., Disp: 60 tablet, Rfl: 1 .  furosemide (LASIX) 40 MG tablet, Take 1 tablet (40 mg total) by mouth daily., Disp: 30 tablet, Rfl: 1 .  levothyroxine (SYNTHROID) 25 MCG tablet, Take 1 tablet (25 mcg total) by mouth daily at 6 (six) AM., Disp: 30 tablet, Rfl: 4 .  metFORMIN (GLUCOPHAGE) 500 MG tablet, Take 1 tablet (500 mg total) by mouth 2 (two) times daily with a meal., Disp: 60 tablet, Rfl: 4 .  Multiple Vitamin (MULTIVITAMIN) tablet, Take 1 tablet by mouth daily., Disp: , Rfl:  .  Amino Acids-Protein Hydrolys (FEEDING SUPPLEMENT, PRO-STAT SUGAR FREE 64,) LIQD, Take 30 mLs by mouth daily. (Patient not taking: Reported on 09/16/2019), Disp: 887 mL, Rfl: 0  Past Medical History: Past Medical History:  Diagnosis Date  . Aortic valve stenosis 08/06/2019  . Ascites 07/15/2019  . Cellulitis and abscess of left lower extremity 07/21/2019  . Chronic combined systolic  (congestive) and diastolic (congestive) heart failure ([place])   . Cirrhosis of liver with ascites ([place])   . Critical aortic valve stenosis   . Hepatic cirrhosis ([place]) 07/15/2019  . Hyperglycemia 07/21/2019  . Left kidney mass 07/15/2019  . Morbid obesity ([place])   . New onset of congestive heart failure ([place]) 07/21/2019  . Pleural effusion   . Pleural effusion on right 07/15/2019  . S/P aortic valve replacement with bioprosthetic valve 08/06/2019   Size 25 mm  . S/P AVR 08/07/2019  . Severe aortic stenosis   . Splenomegaly 07/15/2019  . Systolic ejection murmur 09/13/1973    Tobacco Use: Social History   Tobacco Use  Smoking Status Never Smoker  Smokeless Tobacco Never Used    Labs: Recent Review Flowsheet Data    Labs for ITP Cardiac and Pulmonary Rehab Latest Ref Rng & Units 08/07/2019 08/07/2019 08/08/2019 08/09/2019 08/10/2019   Cholestrol 0 - 200 mg/dL - - - - -   LDLCALC 0 - 99 mg/dL - - - - -   HDL >40 mg/dL - - - - -   Trlycerides <150 mg/dL - - - - -   Hemoglobin A1c 4.8 - 5.6 % - - - - -   PHART 7.35 - 7.45 7.441 - - - -   PCO2ART 32 - 48 mmHg 38.6 - - - -   HCO3 20.0 - 28.0 mmol/L 26.5 - - - -   TCO2 22 - 32  mmol/L 28 - - - -   ACIDBASEDEF 0.0 - 2.0 mmol/L - - - - -   O2SAT % 99.0 49.8 73.6 71.1 73.8      Capillary Blood Glucose: Lab Results  Component Value Date   GLUCAP 121 (H) 10/01/2019   GLUCAP 142 (H) 08/17/2019   GLUCAP 112 (H) 08/17/2019   GLUCAP 175 (H) 08/16/2019   GLUCAP 144 (H) 08/16/2019     Exercise Target Goals: Exercise Program Goal: Individual exercise prescription set using results from initial 6 min walk test and THRR while considering  patient's activity barriers and safety.   Exercise Prescription Goal: Starting with aerobic activity 30 plus minutes a day, 3 days per week for initial exercise prescription. Provide home exercise prescription and guidelines that participant acknowledges understanding prior to discharge.  Activity Barriers & Risk  Stratification:  Activity Barriers & Cardiac Risk Stratification - 10/01/19 1539      Activity Barriers & Cardiac Risk Stratification   Activity Barriers Muscular Weakness;Back Problems;Joint Problems    Cardiac Risk Stratification High           6 Minute Walk:  6 Minute Walk    Row Name 10/01/19 1535         6 Minute Walk   Phase Initial     Distance 1559 feet     Walk Time 6 minutes     # of Rest Breaks 0     MPH 2.95     METS 3.88     RPE 12     Perceived Dyspnea  0     VO2 Peak 13.59     Symptoms No     Resting HR 88 bpm     Resting BP 104/68     Resting Oxygen Saturation  98 %     Exercise Oxygen Saturation  during 6 min walk 96 %     Max Ex. HR 106 bpm     Max Ex. BP 110/70     2 Minute Post BP 104/66            Oxygen Initial Assessment:   Oxygen Re-Evaluation:   Oxygen Discharge (Final Oxygen Re-Evaluation):   Initial Exercise Prescription:  Initial Exercise Prescription - 10/01/19 1600      Date of Initial Exercise RX and Referring Provider   Date 10/01/19    Referring Provider Jerline Pain MD    Expected Discharge Date 11/27/19      Recumbant Bike   Level 2    Watts 25    Minutes 15    METs 2.5      NuStep   Level 2    SPM 75    Minutes 15    METs 2.5      Prescription Details   Frequency (times per week) 3x    Duration Progress to 30 minutes of continuous aerobic without signs/symptoms of physical distress      Intensity   THRR 40-80% of Max Heartrate 66-133    Ratings of Perceived Exertion 11-13    Perceived Dyspnea 0-4      Progression   Progression Continue progressive overload as per policy without signs/symptoms or physical distress.      Resistance Training   Training Prescription Yes    Weight 3lbs    Reps 10-15           Perform Capillary Blood Glucose checks as needed.  Exercise Prescription Changes:   Exercise Comments:   Exercise Goals and Review:  Exercise Goals    Row Name 10/01/19 1541              Exercise Goals   Increase Physical Activity Yes       Intervention Provide advice, education, support and counseling about physical activity/exercise needs.;Develop an individualized exercise prescription for aerobic and resistive training based on initial evaluation findings, risk stratification, comorbidities and participant's personal goals.       Expected Outcomes Long Term: Add in home exercise to make exercise part of routine and to increase amount of physical activity.;Short Term: Attend rehab on a regular basis to increase amount of physical activity.;Long Term: Exercising regularly at least 3-5 days a week.       Increase Strength and Stamina Yes       Intervention Provide advice, education, support and counseling about physical activity/exercise needs.;Develop an individualized exercise prescription for aerobic and resistive training based on initial evaluation findings, risk stratification, comorbidities and participant's personal goals.       Expected Outcomes Short Term: Increase workloads from initial exercise prescription for resistance, speed, and METs.;Short Term: Perform resistance training exercises routinely during rehab and add in resistance training at home;Long Term: Improve cardiorespiratory fitness, muscular endurance and strength as measured by increased METs and functional capacity (6MWT)       Able to understand and use rate of perceived exertion (RPE) scale Yes       Intervention Provide education and explanation on how to use RPE scale       Expected Outcomes Short Term: Able to use RPE daily in rehab to express subjective intensity level;Long Term:  Able to use RPE to guide intensity level when exercising independently       Knowledge and understanding of Target Heart Rate Range (THRR) Yes       Intervention Provide education and explanation of THRR including how the numbers were predicted and where they are located for reference       Expected Outcomes Short  Term: Able to state/look up THRR;Long Term: Able to use THRR to govern intensity when exercising independently;Short Term: Able to use daily as guideline for intensity in rehab       Able to check pulse independently Yes       Intervention Provide education and demonstration on how to check pulse in carotid and radial arteries.;Review the importance of being able to check your own pulse for safety during independent exercise       Expected Outcomes Short Term: Able to explain why pulse checking is important during independent exercise;Long Term: Able to check pulse independently and accurately       Understanding of Exercise Prescription Yes       Intervention Provide education, explanation, and written materials on patient's individual exercise prescription       Expected Outcomes Short Term: Able to explain program exercise prescription;Long Term: Able to explain home exercise prescription to exercise independently              Exercise Goals Re-Evaluation :    Discharge Exercise Prescription (Final Exercise Prescription Changes):   Nutrition:  Target Goals: Understanding of nutrition guidelines, daily intake of sodium <1546m, cholesterol <2042m calories 30% from fat and 7% or less from saturated fats, daily to have 5 or more servings of fruits and vegetables.  Biometrics:  Pre Biometrics - 10/01/19 1538      Pre Biometrics   Height 5' 10.75" (1.797 m)    Weight 103.6 kg    Waist Circumference  44.5 inches    Hip Circumference 47 inches    Waist to Hip Ratio 0.95 %    BMI (Calculated) 32.08    Triceps Skinfold 8 mm    % Body Fat 28.1 %    Grip Strength 32 kg    Flexibility 12.75 in    Single Leg Stand 30 seconds            Nutrition Therapy Plan and Nutrition Goals:   Nutrition Assessments:   Nutrition Goals Re-Evaluation:   Nutrition Goals Discharge (Final Nutrition Goals Re-Evaluation):   Psychosocial: Target Goals: Acknowledge presence or absence of  significant depression and/or stress, maximize coping skills, provide positive support system. Participant is able to verbalize types and ability to use techniques and skills needed for reducing stress and depression.  Initial Review & Psychosocial Screening:  Initial Psych Review & Screening - 10/01/19 1448      Initial Review   Current issues with None Identified      Family Dynamics   Good Support System? Yes   Tom lives alone but has the support of his friends who live near by. Tom's mom and siblings live out of state     Barriers   Psychosocial barriers to participate in program There are no identifiable barriers or psychosocial needs.      Screening Interventions   Interventions Encouraged to exercise           Quality of Life Scores:  Quality of Life - 10/01/19 1542      Quality of Life   Select Quality of Life      Quality of Life Scores   Health/Function Pre 25.43 %    Socioeconomic Pre 25.5 %    Psych/Spiritual Pre 24.92 %    Family Pre 29.17 %    GLOBAL Pre 25.71 %          Scores of 19 and below usually indicate a poorer quality of life in these areas.  A difference of  2-3 points is a clinically meaningful difference.  A difference of 2-3 points in the total score of the Quality of Life Index has been associated with significant improvement in overall quality of life, self-image, physical symptoms, and general health in studies assessing change in quality of life.  PHQ-9: Recent Review Flowsheet Data    Depression screen [facility] 2/9 10/01/2019   Decreased Interest 0   Down, Depressed, Hopeless 0   PHQ - 2 Score 0     Interpretation of Total Score  Total Score Depression Severity:  1-4 = Minimal depression, 5-9 = Mild depression, 10-14 = Moderate depression, 15-19 = Moderately severe depression, 20-27 = Severe depression   Psychosocial Evaluation and Intervention:   Psychosocial Re-Evaluation:   Psychosocial Discharge (Final Psychosocial  Re-Evaluation):   Vocational Rehabilitation: Provide vocational rehab assistance to qualifying candidates.   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 Participant will state understanding/return demonstration of topics presented.  Learning Barriers/Preferences:  Learning Barriers/Preferences - 10/01/19 1542      Learning Barriers/Preferences   Learning Barriers Sight    Learning Preferences Written Material;Individual Instruction;Skilled Demonstration  Education Topics: Hypertension, Hypertension Reduction -Define heart disease and high blood pressure. Discus how high blood pressure affects the body and ways to reduce high blood pressure.   Exercise and Your Heart -Discuss why it is important to exercise, the FITT principles of exercise, normal and abnormal responses to exercise, and how to exercise safely.   Angina -Discuss definition of angina, causes of angina, treatment of angina, and how to decrease risk of having angina.   Cardiac Medications -Review what the following cardiac medications are used for, how they affect the body, and side effects that may occur when taking the medications.  Medications include Aspirin, Beta blockers, calcium channel blockers, ACE Inhibitors, angiotensin receptor blockers, diuretics, digoxin, and antihyperlipidemics.   Congestive Heart Failure -Discuss the definition of CHF, how to live with CHF, the signs and symptoms of CHF, and how keep track of weight and sodium intake.   Heart Disease and Intimacy -Discus the effect sexual activity has on the heart, how changes occur during  intimacy as we age, and safety during sexual activity.   Smoking Cessation / COPD -Discuss different methods to quit smoking, the health benefits of quitting smoking, and the definition of COPD.   Nutrition I: Fats -Discuss the types of cholesterol, what cholesterol does to the heart, and how cholesterol levels can be controlled.   Nutrition II: Labels -Discuss the different components of food labels and how to read food label   Heart Parts/Heart Disease and PAD -Discuss the anatomy of the heart, the pathway of blood circulation through the heart, and these are affected by heart disease.   Stress I: Signs and Symptoms -Discuss the causes of stress, how stress may lead to anxiety and depression, and ways to limit stress.   Stress II: Relaxation -Discuss different types of relaxation techniques to limit stress.   Warning Signs of Stroke / TIA -Discuss definition of a stroke, what the signs and symptoms are of a stroke, and how to identify when someone is having stroke.   Knowledge Questionnaire Score:  Knowledge Questionnaire Score - 10/01/19 1542      Knowledge Questionnaire Score   Pre Score 21/24           Core Components/Risk Factors/Patient Goals at Admission:  Personal Goals and Risk Factors at Admission - 10/02/19 0837      Core Components/Risk Factors/Patient Goals on Admission    Weight Management Yes;Obesity;Weight Loss;Weight Maintenance    Intervention Weight Management: Develop a combined nutrition and exercise program designed to reach desired caloric intake, while maintaining appropriate intake of nutrient and fiber, sodium and fats, and appropriate energy expenditure required for the weight goal.;Weight Management: Provide education and appropriate resources to help participant work on and attain dietary goals.;Weight Management/Obesity: Establish reasonable short term and long term weight goals.;Obesity: Provide education and appropriate resources to help  participant work on and attain dietary goals.    Diabetes Yes    Intervention Provide education about signs/symptoms and action to take for hypo/hyperglycemia.;Provide education about proper nutrition, including hydration, and aerobic/resistive exercise prescription along with prescribed medications to achieve blood glucose in normal ranges: Fasting glucose 65-99 mg/dL    Expected Outcomes Short Term: Participant verbalizes understanding of the signs/symptoms and immediate care of hyper/hypoglycemia, proper foot care and importance of medication, aerobic/resistive exercise and nutrition plan for blood glucose control.;Long Term: Attainment of HbA1C < 7%.    Hypertension Yes    Intervention Provide education on lifestyle modifcations including regular physical activity/exercise, weight management, moderate sodium  restriction and increased consumption of fresh fruit, vegetables, and low fat dairy, alcohol moderation, and smoking cessation.;Monitor prescription use compliance.    Expected Outcomes Short Term: Continued assessment and intervention until BP is < 140/39m HG in hypertensive participants. < 130/824mHG in hypertensive participants with diabetes, heart failure or chronic kidney disease.;Long Term: Maintenance of blood pressure at goal levels.           Core Components/Risk Factors/Patient Goals Review:    Core Components/Risk Factors/Patient Goals at Discharge (Final Review):    ITP Comments:  ITP Comments    Row Name 10/01/19 1535           ITP Comments Dr. TrFransico HimMedical Director              Comments: ToGershon Musselattended orientation on 10/02/2019 to review rules and guidelines for program.  Completed 6 minute walk test, Intitial ITP, and exercise prescription.  VSS. Telemetry-Sinus Rhythm with PVC's .  Asymptomatic.Please see previous note regarding PVC's Safety measures and social distancing in place per CDC guidelines.MaBarnet PallRN,BSN 10/02/2019 8:43 AM

## 2019-10-05 ENCOUNTER — Encounter (HOSPITAL_COMMUNITY)
Admission: RE | Admit: 2019-10-05 | Discharge: 2019-10-05 | Disposition: A | Discharge status: Home / Self Care | Payer: 59 | Source: Ambulatory Visit | Attending: Cardiology | Admitting: Cardiology

## 2019-10-05 ENCOUNTER — Other Ambulatory Visit: Payer: Self-pay

## 2019-10-05 DIAGNOSIS — Z952 Presence of prosthetic heart valve: Secondary | ICD-10-CM | POA: Insufficient documentation

## 2019-10-05 LAB — GLUCOSE, CAPILLARY: Glucose-Capillary: 92 mg/dL (ref 70–99)

## 2019-10-05 NOTE — Progress Notes (Signed)
Daily Session Note  Patient Details  Name: Bart Ashford MRN: [id number] Date of Birth: 07/27/1965 Referring Provider:     [place] from 10/01/2019 in [place]  Referring Provider Jerline Pain MD      Encounter Date: 10/05/2019  Check In:  Session Check In - 10/05/19 1524      Check-In   Supervising physician immediately available to respond to emergencies [facility] immediately available    Physician(s) Dr. Wyline Copas    Location [facility]    Staff Present Barnet Pall, RN, Milus Glazier, MS, EP-C, CCRP;Jessica Hassell Done, MS, ACSM-CEP, Exercise Physiologist;Portia Rollene Rotunda, RN, Deland Pretty, MS, ACSM CEP, Exercise Physiologist    Virtual Visit No    Medication changes reported     No    Fall or balance concerns reported    No    Tobacco Cessation No Change    Warm-up and Cool-down Performed on first and last piece of equipment    Resistance Training Performed Yes    VAD Patient? No    PAD/SET Patient? No      Pain Assessment   Currently in Pain? No/denies    Pain Score 0-No pain    Multiple Pain Sites No           Capillary Blood Glucose: Results for orders placed or performed during the hospital encounter of 10/05/19 (from the past 24 hour(s))  Glucose, capillary     Status: None   Collection Time: 10/05/19  4:25 PM  Result Value Ref Range   Glucose-Capillary 92 70 - 99 mg/dL     Exercise Prescription Changes - 10/05/19 1645      Response to Exercise   Blood Pressure (Admit) 116/64    Blood Pressure (Exercise) 150/84    Blood Pressure (Exit) 106/68    Heart Rate (Admit) 86 bpm    Heart Rate (Exercise) 94 bpm    Heart Rate (Exit) 76 bpm    Rating of Perceived Exertion (Exercise) 12    Symptoms None    Comments Pt's first day of exercise in the CRP2 program.     Duration Progress to 30 minutes of  aerobic without signs/symptoms of physical distress    Intensity THRR  unchanged      Progression   Progression Continue to progress workloads to maintain intensity without signs/symptoms of physical distress.      Resistance Training   Training Prescription Yes    Weight 3lbs    Reps 10-15    Time 10 Minutes      Interval Training   Interval Training No      Bike   Level 2    Minutes 15    METs 3.2      NuStep   Level 2    SPM 75    Minutes 15    METs 2.2           Social History   Tobacco Use  Smoking Status Never Smoker  Smokeless Tobacco Never Used    Goals Met:  Exercise tolerated well No report of cardiac concerns or symptoms Strength training completed today  Goals Unmet:  Not Applicable  Comments: Gershon Mussel started cardiac rehab today.  Pt tolerated light exercise without difficulty. VSS, telemetry-Sinus Rhythm with PVc's, asymptomatic.  Medication list reconciled. Pt denies barriers to medicaiton compliance.  PSYCHOSOCIAL ASSESSMENT:  PHQ-0. Pt exhibits positive coping skills, hopeful outlook with supportive family. No psychosocial needs identified at this  time, no psychosocial interventions necessary.    Pt enjoys going to the movies.   Pt oriented to exercise equipment and routine.    Understanding verbalized.Barnet Pall, RN,BSN 10/06/2019 9:47 AM   Dr. Fransico Him is Medical Director for Cardiac Rehab at [facility].

## 2019-10-07 ENCOUNTER — Ambulatory Visit (HOSPITAL_COMMUNITY)
Admission: EM | Admit: 2019-10-07 | Discharge: 2019-10-07 | Disposition: A | Discharge status: Home / Self Care | Payer: 59

## 2019-10-07 ENCOUNTER — Encounter (HOSPITAL_COMMUNITY)
Admission: RE | Admit: 2019-10-07 | Discharge: 2019-10-07 | Disposition: A | Discharge status: Home / Self Care | Payer: 59 | Source: Ambulatory Visit | Attending: Cardiology | Admitting: Cardiology

## 2019-10-07 ENCOUNTER — Encounter (HOSPITAL_COMMUNITY): Payer: Self-pay | Admitting: Emergency Medicine

## 2019-10-07 ENCOUNTER — Other Ambulatory Visit: Payer: Self-pay

## 2019-10-07 DIAGNOSIS — S61209A Unspecified open wound of unspecified finger without damage to nail, initial encounter: Secondary | ICD-10-CM

## 2019-10-07 DIAGNOSIS — Z952 Presence of prosthetic heart valve: Secondary | ICD-10-CM

## 2019-10-07 MED ORDER — BACITRACIN ZINC 500 UNIT/GM EX OINT
TOPICAL_OINTMENT | CUTANEOUS | Status: AC
  Filled 2019-10-07: qty 0.9

## 2019-10-07 NOTE — Progress Notes (Signed)
Incomplete Session Note  Patient Details  Name: Kingslee Mairena MRN: [id number] Date of Birth: [birth year] Referring Provider:     [place] from 10/01/2019 in [place]  Referring Provider Jerline Pain MD      [name] did not complete his rehab session.  Tom came to cardiac rehab with a bandage intact  on his middle  finger saying that he sliced the tip of his finger off making lunch today. I advised that Gershon Mussel get his finger evaluated for further care. Gershon Mussel said that he will go to the Urgent Care on [address]. Patient did not exercise today.Barnet Pall, RN,BSN 10/07/2019 3:35 PM

## 2019-10-07 NOTE — Discharge Instructions (Signed)
Keep wound bandaged for the next 24 hours  Replace bandage daily and apply pressure dressing  Follow up with PCP about tetanus once covid series is complete

## 2019-10-07 NOTE — ED Triage Notes (Signed)
Pt presents with a finger laceration that happened today. Unable to tell when last Tdap was.

## 2019-10-07 NOTE — ED Provider Notes (Signed)
[facility]    CSN: [id number] Arrival date & time: 10/07/19  1549      History   Chief Complaint Chief Complaint  Patient presents with   Finger Injury    HPI Steve Richards is a 54 y.o. male.   Patient on aspirin therapy reports for avulsion laceration to left middle finger.  Reports earlier using a large slicer in the kitchen when he sliced part of the tip of his finger.  He reports the skin flap came off.  He reports he has had some issue with getting bleeding to stop.  Has had bandage since occurred.  Did wash with soap and water.  Unsure of last tetanus.  Is in the middle of his Covid series.     Past Medical History:  Diagnosis Date   Aortic valve stenosis 08/06/2019   Ascites 07/15/2019   Cellulitis and abscess of left lower extremity 07/21/2019   Chronic combined systolic (congestive) and diastolic (congestive) heart failure (HCC)    Cirrhosis of liver with ascites (HCC)    Critical aortic valve stenosis    Hepatic cirrhosis ([place]) 07/15/2019   Hyperglycemia 07/21/2019   Left kidney mass 07/15/2019   Morbid obesity ([place])    New onset of congestive heart failure ([place]) 07/21/2019   Pleural effusion    Pleural effusion on right 07/15/2019   S/P aortic valve replacement with bioprosthetic valve 08/06/2019   Size 25 mm   S/P AVR 08/07/2019   Severe aortic stenosis    Splenomegaly 2/68/3419   Systolic ejection murmur 08/24/2977    Patient Active Problem List   Diagnosis Date Noted   S/P AVR 08/07/2019   S/P aortic valve replacement with bioprosthetic valve 08/06/2019   Aortic valve stenosis 08/06/2019   Severe aortic stenosis    New onset of congestive heart failure ([place]) 07/21/2019   Cellulitis and abscess of left lower extremity 07/21/2019   Hyperglycemia 07/21/2019   Obesity, Class III, BMI 40-49.9 (morbid obesity) ([place]) 07/21/2019   Ascites 07/15/2019   Hepatic cirrhosis (HCC) 07/15/2019   Pleural effusion on right 07/15/2019    Left kidney mass 07/15/2019   Splenomegaly 89/21/1941   Systolic ejection murmur 74/10/1446    Past Surgical History:  Procedure Laterality Date   AORTIC VALVE REPLACEMENT N/A 08/06/2019   Procedure: AORTIC VALVE REPLACEMENT (AVR) using Margaretha Sheffield Resilia 25 MM Aortic Valve.;  Surgeon: Gaye Pollack, MD;  Location: [facility];  Service: Open Heart Surgery;  Laterality: N/A;   CARDIAC CATHETERIZATION     EXPLORATION POST OPERATIVE OPEN HEART N/A 08/06/2019   Procedure: EXPLORATION POST OPERATIVE OPEN HEART;  Surgeon: Gaye Pollack, MD;  Location: [facility];  Service: Open Heart Surgery;  Laterality: N/A;   IR THORACENTESIS ASP PLEURAL SPACE W/IMG GUIDE  06/25/2019   RIGHT/LEFT HEART CATH AND CORONARY ANGIOGRAPHY N/A 07/28/2019   Procedure: RIGHT/LEFT HEART CATH AND CORONARY ANGIOGRAPHY;  Surgeon: Sherren Mocha, MD;  Location: [place] CV LAB;  Service: Cardiovascular;  Laterality: N/A;   TEE WITHOUT CARDIOVERSION N/A 08/06/2019   Procedure: TRANSESOPHAGEAL ECHOCARDIOGRAM (TEE);  Surgeon: Gaye Pollack, MD;  Location: [place] [facility];  Service: Open Heart Surgery;  Laterality: N/A;       Home Medications    Prior to Admission medications   Medication Sig Start Date End Date Taking? Authorizing Provider  acetaminophen (TYLENOL) 325 MG tablet Take 2 tablets (650 mg total) by mouth every 6 (six) hours as needed for mild pain. 08/17/19   Elgie Collard, PA-C  Amino Acids-Protein Hydrolys (FEEDING SUPPLEMENT, PRO-STAT SUGAR FREE 64,) LIQD Take 30 mLs by mouth daily. Patient not taking: Reported on 09/16/2019 08/18/19   Elgie Collard, PA-C  amoxicillin (AMOXIL) 500 MG capsule Take 4 capsules by mouth 30-60 minutes before dental procedures 08/25/19   Richardson Dopp T, PA-C  aspirin EC 325 MG EC tablet Take 1 tablet (325 mg total) by mouth daily. 08/18/19   Elgie Collard, PA-C  carvedilol (COREG) 3.125 MG tablet Take 1 tablet (3.125 mg total) by mouth 2 (two) times daily with a meal. 08/17/19   Elgie Collard, PA-C  furosemide (LASIX) 40 MG tablet Take 1 tablet (40 mg total) by mouth daily. 08/27/19   Elgie Collard, PA-C  levothyroxine (SYNTHROID) 25 MCG tablet Take 1 tablet (25 mcg total) by mouth daily at 6 (six) AM. 09/10/19   Rutherford Guys, MD  metFORMIN (GLUCOPHAGE) 500 MG tablet Take 1 tablet (500 mg total) by mouth 2 (two) times daily with a meal. 09/10/19   Rutherford Guys, MD  Multiple Vitamin (MULTIVITAMIN) tablet Take 1 tablet by mouth daily.    [provider]    Family History Family History  Problem Relation Age of Onset   Osteoporosis Mother    Other Father        died in a hurricane    Social History Social History   Tobacco Use   Smoking status: Never Smoker   Smokeless tobacco: Never Used  Substance Use Topics   Alcohol use: Yes    Comment: rarely   Drug use: Never     Allergies   Patient has no known allergies.   Review of Systems Review of Systems   Physical Exam Triage Vital Signs ED Triage Vitals  Enc Vitals Group     BP 10/07/19 1708 133/78     Pulse Rate 10/07/19 1708 70     Resp 10/07/19 1708 17     Temp 10/07/19 1708 98.1 F (36.7 C)     Temp Source 10/07/19 1708 Oral     SpO2 10/07/19 1708 100 %     Weight --      Height --      Head Circumference --      Peak Flow --      Pain Score 10/07/19 1707 7     Pain Loc --      Pain Edu? --      Excl. in [place]? --    No data found.  Updated Vital Signs BP 133/78 (BP Location: Left Arm)    Pulse 70    Temp 98.1 F (36.7 C) (Oral)    Resp 17    SpO2 100%   Visual Acuity Right Eye Distance:   Left Eye Distance:   Bilateral Distance:    Right Eye Near:   Left Eye Near:    Bilateral Near:     Physical Exam Vitals and nursing note reviewed.  Skin:    Comments: Tip of left middle anger with approximately 5 to 6 mm wide avulsion.  Unable to approximate.  Slow ooze.  Controlled with gauze.      UC Treatments / Results  Labs (all labs ordered are listed, but only  abnormal results are displayed) Labs Reviewed - No data to display  EKG   Radiology No results found.  Procedures Procedures (including critical care time)  Medications Ordered in UC Medications - No data to display  Initial Impression / Assessment and Plan /  UC Course  I have reviewed the triage vital signs and the nursing notes.  Pertinent labs & imaging results that were available during my care of the patient were reviewed by me and considered in my medical decision making (see chart for details).     #Finger avulsion Patient is a 54 year old aspirin therapy presenting with avulsion of left middle finger.  Not amenable for suturing, will heal well from secondary intention.  Bacitracin applied.  Pressure dressing applied.  Wound care discussed.  Return and follow-up precautions discussed.  Discussed that we would defer tetanus update as he is in the middle of his Covid vaccines and it is a low risk wound..  Instructed him to follow-up with his primary care about this Final Clinical Impressions(s) / UC Diagnoses   Final diagnoses:  Avulsion of finger, initial encounter     Discharge Instructions     Keep wound bandaged for the next 24 hours  Replace bandage daily and apply pressure dressing  Follow up with PCP about tetanus once covid series is complete      ED Prescriptions    None     PDMP not reviewed this encounter.   Purnell Shoemaker, PA-C 10/07/19 1841

## 2019-10-09 ENCOUNTER — Other Ambulatory Visit: Payer: Self-pay

## 2019-10-09 ENCOUNTER — Encounter (HOSPITAL_COMMUNITY): Payer: 59

## 2019-10-09 ENCOUNTER — Ambulatory Visit (INDEPENDENT_AMBULATORY_CARE_PROVIDER_SITE_OTHER): Payer: 59 | Admitting: Cardiology

## 2019-10-09 ENCOUNTER — Encounter: Payer: Self-pay | Admitting: Cardiology

## 2019-10-09 VITALS — BP 100/60 | HR 84 | Ht 70.75 in | Wt 232.6 lb

## 2019-10-09 DIAGNOSIS — Z952 Presence of prosthetic heart valve: Secondary | ICD-10-CM

## 2019-10-09 MED ORDER — CARVEDILOL 3.125 MG PO TABS: 3.1250 mg | ORAL_TABLET | Freq: Two times a day (BID) | ORAL | 3 refills | Status: DC

## 2019-10-09 MED ORDER — FUROSEMIDE 40 MG PO TABS: 40.0000 mg | ORAL_TABLET | Freq: Every day | ORAL | 3 refills | Status: DC

## 2019-10-09 NOTE — Patient Instructions (Signed)
Medication Instructions:  The current medical regimen is effective;  continue present plan and medications.  *If you need a refill on your cardiac medications before your next appointment, please call your pharmacy*  Follow-Up: At [facility], you and your health needs are our priority.  As part of our continuing mission to provide you with exceptional heart care, we have created designated Provider Care Teams.  These Care Teams include your primary Cardiologist (physician) and Advanced Practice Providers (APPs -  Physician Assistants and Nurse Practitioners) who all work together to provide you with the care you need, when you need it.  We recommend signing up for the patient portal called "MyChart".  Sign up information is provided on this After Visit Summary.  MyChart is used to connect with patients for Virtual Visits (Telemedicine).  Patients are able to view lab/test results, encounter notes, upcoming appointments, etc.  Non-urgent messages can be sent to your provider as well.   To learn more about what you can do with MyChart, go to [email or web address].    Your next appointment:   3 month(s)  The format for your next appointment:   In Person  Provider:   Candee Furbish, MD   Thank you for choosing [facility]!!

## 2019-10-09 NOTE — Progress Notes (Signed)
Cardiology Office Note:    Date:  10/09/2019   ID:  Steve Richards, DOB 1965/09/09, MRN [id number]  PCP:  Rutherford Guys, MD  [facility] Cardiologist:  Candee Furbish, MD  [facility] Electrophysiologist:  None   Referring MD: Rutherford Guys, MD     History of Present Illness:    Steve Richards is a 54 y.o. male aortic valve replacement with a 25 mm pericardial valve on 08/06/2019.  He presented with stage D, critical, symptomatic aortic stenosis with New York Heart Association class IV symptoms.  He has made an amazing recovery postoperatively.  Wonderful. No fevers chills nausea vomiting syncope bleeding.  Past Medical History:  Diagnosis Date  . Aortic valve stenosis 08/06/2019  . Ascites 07/15/2019  . Cellulitis and abscess of left lower extremity 07/21/2019  . Chronic combined systolic (congestive) and diastolic (congestive) heart failure ([place])   . Cirrhosis of liver with ascites ([place])   . Critical aortic valve stenosis   . Hepatic cirrhosis ([place]) 07/15/2019  . Hyperglycemia 07/21/2019  . Left kidney mass 07/15/2019  . Morbid obesity ([place])   . New onset of congestive heart failure ([place]) 07/21/2019  . Pleural effusion   . Pleural effusion on right 07/15/2019  . S/P aortic valve replacement with bioprosthetic valve 08/06/2019   Size 25 mm  . S/P AVR 08/07/2019  . Severe aortic stenosis   . Splenomegaly 07/15/2019  . Systolic ejection murmur 1/69/6789    Past Surgical History:  Procedure Laterality Date  . AORTIC VALVE REPLACEMENT N/A 08/06/2019   Procedure: AORTIC VALVE REPLACEMENT (AVR) using Margaretha Sheffield Resilia 25 MM Aortic Valve.;  Surgeon: Gaye Pollack, MD;  [facility];  Service: Open Heart Surgery;  Laterality: N/A;  . CARDIAC CATHETERIZATION    . EXPLORATION POST OPERATIVE OPEN HEART N/A 08/06/2019   Procedure: EXPLORATION POST OPERATIVE OPEN HEART;  Surgeon: Gaye Pollack, MD;  Location: [place];  Service: Open Heart Surgery;  Laterality: N/A;  . IR THORACENTESIS  ASP PLEURAL SPACE W/IMG GUIDE  06/25/2019  . RIGHT/LEFT HEART CATH AND CORONARY ANGIOGRAPHY N/A 07/28/2019   Procedure: RIGHT/LEFT HEART CATH AND CORONARY ANGIOGRAPHY;  Surgeon: Sherren Mocha, MD;  Location: [place] CV LAB;  Service: Cardiovascular;  Laterality: N/A;  . TEE WITHOUT CARDIOVERSION N/A 08/06/2019   Procedure: TRANSESOPHAGEAL ECHOCARDIOGRAM (TEE);  Surgeon: Gaye Pollack, MD;  Location: [place];  Service: Open Heart Surgery;  Laterality: N/A;    Current Medications: Current Meds  Medication Sig  . acetaminophen (TYLENOL) 325 MG tablet Take 2 tablets (650 mg total) by mouth every 6 (six) hours as needed for mild pain.  . Amino Acids-Protein Hydrolys (FEEDING SUPPLEMENT, PRO-STAT SUGAR FREE 64,) LIQD Take 30 mLs by mouth daily.  Marland Kitchen amoxicillin (AMOXIL) 500 MG capsule Take 4 capsules by mouth 30-60 minutes before dental procedures  . aspirin EC 325 MG EC tablet Take 1 tablet (325 mg total) by mouth daily.  . carvedilol (COREG) 3.125 MG tablet Take 1 tablet (3.125 mg total) by mouth 2 (two) times daily with a meal.  . furosemide (LASIX) 40 MG tablet Take 1 tablet (40 mg total) by mouth daily.  Marland Kitchen levothyroxine (SYNTHROID) 25 MCG tablet Take 1 tablet (25 mcg total) by mouth daily at 6 (six) AM.  . metFORMIN (GLUCOPHAGE) 500 MG tablet Take 1 tablet (500 mg total) by mouth 2 (two) times daily with a meal.  . Multiple Vitamin (MULTIVITAMIN) tablet Take 1 tablet by mouth daily.  . [DISCONTINUED] carvedilol (  COREG) 3.125 MG tablet Take 1 tablet (3.125 mg total) by mouth 2 (two) times daily with a meal.  . [DISCONTINUED] furosemide (LASIX) 40 MG tablet Take 1 tablet (40 mg total) by mouth daily.     Allergies:   Patient has no known allergies.   Social History   Socioeconomic History  . Marital status: Single    Spouse name: Not on file  . Number of children: Not on file  . Years of education: 20  . Highest education level: Bachelor's degree (e.g., BA, AB, BS)  Occupational History   . 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 Smoking status: Never Smoker  . Smokeless tobacco: Never Used  Substance and Sexual Activity  . Alcohol use: Yes    Comment: rarely  . Drug use: Never  . Sexual activity: Not on file  Other Topics Concern  . Not on file  Social History Narrative  . Not on file   Social Determinants of Health   Financial Resource Strain:   . Difficulty of Paying Living Expenses:   Food Insecurity:   . Worried About Charity fundraiser in the Last Year:   . Arboriculturist in the Last Year:   Transportation Needs:   . Film/video editor (Medical):   Marland Kitchen Lack of Transportation (Non-Medical):   Physical Activity:   . Days of Exercise per Week:   . Minutes of Exercise per Session:   Stress:   . Feeling of Stress :   Social Connections:   . Frequency of Communication with Friends and Family:   . Frequency of Social Gatherings with Friends and Family:   . Attends Religious Services:   . Active Member of Clubs or Organizations:   . Attends Archivist Meetings:   Marland Kitchen Marital Status:      Family History: The patient's family history includes Osteoporosis in his mother; Other in his father.  ROS:   Please see the history of present illness.     All other systems reviewed and are negative.  EKGs/Labs/Other Studies Reviewed:    Recent Labs: 07/21/2019: B Natriuretic Peptide 1,142.6; TSH 14.146 08/06/2019: ALT 20; Magnesium 2.0 08/12/2019: Hemoglobin 8.9; Platelets 176 08/25/2019: BUN 14; Creatinine, Ser 0.72; Potassium 4.5; Sodium 141  Recent Lipid Panel    Component Value Date/Time   CHOL 128 07/21/2019 1515   TRIG 87 07/21/2019 1515   HDL 37 (L) 07/21/2019 1515   CHOLHDL 3.5 07/21/2019 1515   VLDL 17 07/21/2019 1515   LDLCALC 74 07/21/2019 1515    Physical Exam:    VS:  BP 100/60   Pulse 84   Ht 5' 10.75" (1.797 m)   Wt 232 lb 9.6 oz (105.5 kg)   SpO2 96%   BMI 32.67 kg/m     Wt Readings from Last 3  Encounters:  10/09/19 232 lb 9.6 oz (105.5 kg)  10/01/19 (!) 228 lb 6.3 oz (103.6 kg)  09/16/19 226 lb (102.5 kg)     GEN:  Well nourished, well developed in no acute distress HEENT: Normal NECK: No JVD; No carotid bruits LYMPHATICS: No lymphadenopathy CARDIAC: RRR, no murmurs, rubs, gallops RESPIRATORY:  Clear to auscultation without rales, wheezing or rhonchi  ABDOMEN: Soft, non-tender, non-distended MUSCULOSKELETAL:  No edema; No deformity  SKIN: Warm and dry NEUROLOGIC:  Alert and oriented x 3 PSYCHIATRIC:  Normal affect   ASSESSMENT:    1. S/P AVR (aortic valve replacement)    PLAN:  In order of problems listed above:  Aortic valve replacement -Excellent job.  Doing well.  Shortness of breath much improved.  Checking echo on the 17th.  Cardiac rehab for 8 weeks. -Dental prophylaxis  Diastolic heart failure -Much improved following aortic valve. -Continue with low-dose carvedilol and furosemide.  Diabetes -Metformin.  We will see back in 3 months.  Dramatic turnaround. Medication Adjustments/Labs and Tests Ordered: Current medicines are reviewed at length with the patient today.  Concerns regarding medicines are outlined above.  No orders of the defined types were placed in this encounter.  Meds ordered this encounter  Medications  . carvedilol (COREG) 3.125 MG tablet    Sig: Take 1 tablet (3.125 mg total) by mouth 2 (two) times daily with a meal.    Dispense:  180 tablet    Refill:  3  . furosemide (LASIX) 40 MG tablet    Sig: Take 1 tablet (40 mg total) by mouth daily.    Dispense:  90 tablet    Refill:  3    Patient Instructions  Medication Instructions:  The current medical regimen is effective;  continue present plan and medications.  *If you need a refill on your cardiac medications before your next appointment, please call your pharmacy*  Follow-Up: At [facility], you and your health needs are our priority.  As part of our continuing  mission to provide you with exceptional heart care, we have created designated Provider Care Teams.  These Care Teams include your primary Cardiologist (physician) and Advanced Practice Providers (APPs -  Physician Assistants and Nurse Practitioners) who all work together to provide you with the care you need, when you need it.  We recommend signing up for the patient portal called "MyChart".  Sign up information is provided on this After Visit Summary.  MyChart is used to connect with patients for Virtual Visits (Telemedicine).  Patients are able to view lab/test results, encounter notes, upcoming appointments, etc.  Non-urgent messages can be sent to your provider as well.   To learn more about what you can do with MyChart, go to [email or web address].    Your next appointment:   3 month(s)  The format for your next appointment:   In Person  Provider:   Candee Furbish, MD   Thank you for choosing [facility]!!        Signed, Candee Furbish, MD  10/09/2019 3:48 PM    [place]

## 2019-10-12 ENCOUNTER — Encounter (HOSPITAL_COMMUNITY)
Admission: RE | Admit: 2019-10-12 | Discharge: 2019-10-12 | Disposition: A | Discharge status: Home / Self Care | Payer: 59 | Source: Ambulatory Visit | Attending: Cardiology | Admitting: Cardiology

## 2019-10-12 ENCOUNTER — Other Ambulatory Visit: Payer: Self-pay

## 2019-10-12 DIAGNOSIS — Z952 Presence of prosthetic heart valve: Secondary | ICD-10-CM

## 2019-10-12 LAB — GLUCOSE, CAPILLARY
Glucose-Capillary: 146 mg/dL — ABNORMAL HIGH (ref 70–99)
Glucose-Capillary: 87 mg/dL (ref 70–99)

## 2019-10-14 ENCOUNTER — Encounter (HOSPITAL_COMMUNITY)
Admission: RE | Admit: 2019-10-14 | Discharge: 2019-10-14 | Disposition: A | Discharge status: Home / Self Care | Payer: 59 | Source: Ambulatory Visit | Attending: Cardiology | Admitting: Cardiology

## 2019-10-14 ENCOUNTER — Other Ambulatory Visit: Payer: Self-pay

## 2019-10-14 DIAGNOSIS — Z952 Presence of prosthetic heart valve: Secondary | ICD-10-CM | POA: Diagnosis not present

## 2019-10-15 NOTE — Progress Notes (Signed)
Cardiac Individual Treatment Plan  Patient Details  Name: Asher Torpey MRN: [id number] Date of Birth: [birth year] Referring Provider:     [place] from 10/01/2019 in [facility]  Referring Provider Jerline Pain MD      Initial Encounter Date:    CARDIAC REHAB PHASE II ORIENTATION from 10/01/2019 in [facility]  Date 10/01/19      Visit Diagnosis: 08/06/19 AVR, Redo Sternotomy for bleeding  Patient's Home Medications on Admission:  Current Outpatient Medications:  .  acetaminophen (TYLENOL) 325 MG tablet, Take 2 tablets (650 mg total) by mouth every 6 (six) hours as needed for mild pain., Disp: , Rfl:  .  Amino Acids-Protein Hydrolys (FEEDING SUPPLEMENT, PRO-STAT SUGAR FREE 64,) LIQD, Take 30 mLs by mouth daily., Disp: 887 mL, Rfl: 0 .  amoxicillin (AMOXIL) 500 MG capsule, Take 4 capsules by mouth 30-60 minutes before dental procedures, Disp: 4 capsule, Rfl: 2 .  aspirin EC 325 MG EC tablet, Take 1 tablet (325 mg total) by mouth daily., Disp: 30 tablet, Rfl: 0 .  carvedilol (COREG) 3.125 MG tablet, Take 1 tablet (3.125 mg total) by mouth 2 (two) times daily with a meal., Disp: 180 tablet, Rfl: 3 .  furosemide (LASIX) 40 MG tablet, Take 1 tablet (40 mg total) by mouth daily., Disp: 90 tablet, Rfl: 3 .  levothyroxine (SYNTHROID) 25 MCG tablet, Take 1 tablet (25 mcg total) by mouth daily at 6 (six) AM., Disp: 30 tablet, Rfl: 4 .  metFORMIN (GLUCOPHAGE) 500 MG tablet, Take 1 tablet (500 mg total) by mouth 2 (two) times daily with a meal., Disp: 60 tablet, Rfl: 4 .  Multiple Vitamin (MULTIVITAMIN) tablet, Take 1 tablet by mouth daily., Disp: , Rfl:   Past Medical History: Past Medical History:  Diagnosis Date  . Aortic valve stenosis 08/06/2019  . Ascites 07/15/2019  . Cellulitis and abscess of left lower extremity 07/21/2019  . Chronic combined systolic (congestive) and diastolic (congestive) heart  failure ([place])   . Cirrhosis of liver with ascites ([place])   . Critical aortic valve stenosis   . Hepatic cirrhosis ([place]) 07/15/2019  . Hyperglycemia 07/21/2019  . Left kidney mass 07/15/2019  . Morbid obesity ([place])   . New onset of congestive heart failure ([place]) 07/21/2019  . Pleural effusion   . Pleural effusion on right 07/15/2019  . S/P aortic valve replacement with bioprosthetic valve 08/06/2019   Size 25 mm  . S/P AVR 08/07/2019  . Severe aortic stenosis   . Splenomegaly 07/15/2019  . Systolic ejection murmur 5/32/9924    Tobacco Use: Social History   Tobacco Use  Smoking Status Never Smoker  Smokeless Tobacco Never Used    Labs: Recent Review Flowsheet Data    Labs for ITP Cardiac and Pulmonary Rehab Latest Ref Rng & Units 08/07/2019 08/07/2019 08/08/2019 08/09/2019 08/10/2019   Cholestrol 0 - 200 mg/dL - - - - -   LDLCALC 0 - 99 mg/dL - - - - -   HDL >40 mg/dL - - - - -   Trlycerides <150 mg/dL - - - - -   Hemoglobin A1c 4.8 - 5.6 % - - - - -   PHART 7.35 - 7.45 7.441 - - - -   PCO2ART 32 - 48 mmHg 38.6 - - - -   HCO3 20.0 - 28.0 mmol/L 26.5 - - - -   TCO2 22 - 32 mmol/L 28 - - - -  ACIDBASEDEF 0.0 - 2.0 mmol/L - - - - -   O2SAT % 99.0 49.8 73.6 71.1 73.8      Capillary Blood Glucose: Lab Results  Component Value Date   GLUCAP 87 10/12/2019   GLUCAP 146 (H) 10/12/2019   GLUCAP 92 10/05/2019   GLUCAP 121 (H) 10/01/2019   GLUCAP 142 (H) 08/17/2019     Exercise Target Goals: Exercise Program Goal: Individual exercise prescription set using results from initial 6 min walk test and THRR while considering  patient's activity barriers and safety.   Exercise Prescription Goal: Starting with aerobic activity 30 plus minutes a day, 3 days per week for initial exercise prescription. Provide home exercise prescription and guidelines that participant acknowledges understanding prior to discharge.  Activity Barriers & Risk Stratification:  Activity Barriers & Cardiac Risk  Stratification - 10/01/19 1539      Activity Barriers & Cardiac Risk Stratification   Activity Barriers Muscular Weakness;Back Problems;Joint Problems    Cardiac Risk Stratification High           6 Minute Walk:  6 Minute Walk    Row Name 10/01/19 1535         6 Minute Walk   Phase Initial     Distance 1559 feet     Walk Time 6 minutes     # of Rest Breaks 0     MPH 2.95     METS 3.88     RPE 12     Perceived Dyspnea  0     VO2 Peak 13.59     Symptoms No     Resting HR 88 bpm     Resting BP 104/68     Resting Oxygen Saturation  98 %     Exercise Oxygen Saturation  during 6 min walk 96 %     Max Ex. HR 106 bpm     Max Ex. BP 110/70     2 Minute Post BP 104/66            Oxygen Initial Assessment:   Oxygen Re-Evaluation:   Oxygen Discharge (Final Oxygen Re-Evaluation):   Initial Exercise Prescription:  Initial Exercise Prescription - 10/01/19 1600      Date of Initial Exercise RX and Referring Provider   Date 10/01/19    Referring Provider Jerline Pain MD    Expected Discharge Date 11/27/19      Recumbant Bike   Level 2    Watts 25    Minutes 15    METs 2.5      NuStep   Level 2    SPM 75    Minutes 15    METs 2.5      Prescription Details   Frequency (times per week) 3x    Duration Progress to 30 minutes of continuous aerobic without signs/symptoms of physical distress      Intensity   THRR 40-80% of Max Heartrate 66-133    Ratings of Perceived Exertion 11-13    Perceived Dyspnea 0-4      Progression   Progression Continue progressive overload as per policy without signs/symptoms or physical distress.      Resistance Training   Training Prescription Yes    Weight 3lbs    Reps 10-15           Perform Capillary Blood Glucose checks as needed.  Exercise Prescription Changes:  Exercise Prescription Changes    Row Name 10/05/19 8003  Response to Exercise   Blood Pressure (Admit) 116/64       Blood Pressure  (Exercise) 150/84       Blood Pressure (Exit) 106/68       Heart Rate (Admit) 86 bpm       Heart Rate (Exercise) 94 bpm       Heart Rate (Exit) 76 bpm       Rating of Perceived Exertion (Exercise) 12       Symptoms None       Comments Pt's first day of exercise in the CRP2 program.        Duration Progress to 30 minutes of  aerobic without signs/symptoms of physical distress       Intensity THRR unchanged         Progression   Progression Continue to progress workloads to maintain intensity without signs/symptoms of physical distress.         Resistance Training   Training Prescription Yes       Weight 3lbs       Reps 10-15       Time 10 Minutes         Interval Training   Interval Training No         Bike   Level 2       Minutes 15       METs 3.2         NuStep   Level 2       SPM 75       Minutes 15       METs 2.2              Exercise Comments:  Exercise Comments    Row Name 10/05/19 1645           Exercise Comments Pt's first day of exercise in CRP2 program. Tolerated session well.              Exercise Goals and Review:  Exercise Goals    Row Name 10/01/19 1541             Exercise Goals   Increase Physical Activity Yes       Intervention Provide advice, education, support and counseling about physical activity/exercise needs.;Develop an individualized exercise prescription for aerobic and resistive training based on initial evaluation findings, risk stratification, comorbidities and participant's personal goals.       Expected Outcomes Long Term: Add in home exercise to make exercise part of routine and to increase amount of physical activity.;Short Term: Attend rehab on a regular basis to increase amount of physical activity.;Long Term: Exercising regularly at least 3-5 days a week.       Increase Strength and Stamina Yes       Intervention Provide advice, education, support and counseling about physical activity/exercise needs.;Develop an  individualized exercise prescription for aerobic and resistive training based on initial evaluation findings, risk stratification, comorbidities and participant's personal goals.       Expected Outcomes Short Term: Increase workloads from initial exercise prescription for resistance, speed, and METs.;Short Term: Perform resistance training exercises routinely during rehab and add in resistance training at home;Long Term: Improve cardiorespiratory fitness, muscular endurance and strength as measured by increased METs and functional capacity (6MWT)       Able to understand and use rate of perceived exertion (RPE) scale Yes       Intervention Provide education and explanation on how to use RPE scale       Expected  Outcomes Short Term: Able to use RPE daily in rehab to express subjective intensity level;Long Term:  Able to use RPE to guide intensity level when exercising independently       Knowledge and understanding of Target Heart Rate Range (THRR) Yes       Intervention Provide education and explanation of THRR including how the numbers were predicted and where they are located for reference       Expected Outcomes Short Term: Able to state/look up THRR;Long Term: Able to use THRR to govern intensity when exercising independently;Short Term: Able to use daily as guideline for intensity in rehab       Able to check pulse independently Yes       Intervention Provide education and demonstration on how to check pulse in carotid and radial arteries.;Review the importance of being able to check your own pulse for safety during independent exercise       Expected Outcomes Short Term: Able to explain why pulse checking is important during independent exercise;Long Term: Able to check pulse independently and accurately       Understanding of Exercise Prescription Yes       Intervention Provide education, explanation, and written materials on patient's individual exercise prescription       Expected Outcomes  Short Term: Able to explain program exercise prescription;Long Term: Able to explain home exercise prescription to exercise independently              Exercise Goals Re-Evaluation :  Exercise Goals Re-Evaluation    Row Name 10/05/19 1645             Exercise Goal Re-Evaluation   Exercise Goals Review Increase Physical Activity;Increase Strength and Stamina;Able to understand and use rate of perceived exertion (RPE) scale;Knowledge and understanding of Target Heart Rate Range (THRR);Understanding of Exercise Prescription       Comments Pt's first day of exercise in the CRP2 program. Pt tolerated initial exercsie session well. Pt understands exercise RX, THRR, and RPE scale, Due to time patient wasunable to do free weights today.       Expected Outcomes Will continue to monitor patients and progress workloads as tolerated.               Discharge Exercise Prescription (Final Exercise Prescription Changes):  Exercise Prescription Changes - 10/05/19 1645      Response to Exercise   Blood Pressure (Admit) 116/64    Blood Pressure (Exercise) 150/84    Blood Pressure (Exit) 106/68    Heart Rate (Admit) 86 bpm    Heart Rate (Exercise) 94 bpm    Heart Rate (Exit) 76 bpm    Rating of Perceived Exertion (Exercise) 12    Symptoms None    Comments Pt's first day of exercise in the CRP2 program.     Duration Progress to 30 minutes of  aerobic without signs/symptoms of physical distress    Intensity THRR unchanged      Progression   Progression Continue to progress workloads to maintain intensity without signs/symptoms of physical distress.      Resistance Training   Training Prescription Yes    Weight 3lbs    Reps 10-15    Time 10 Minutes      Interval Training   Interval Training No      Bike   Level 2    Minutes 15    METs 3.2      NuStep   Level 2    SPM 75  Minutes 15    METs 2.2           Nutrition:  Target Goals: Understanding of nutrition guidelines,  daily intake of sodium <1566m, cholesterol <2024m calories 30% from fat and 7% or less from saturated fats, daily to have 5 or more servings of fruits and vegetables.  Biometrics:  Pre Biometrics - 10/01/19 1538      Pre Biometrics   Height 5' 10.75" (1.797 m)    Weight 103.6 kg    Waist Circumference 44.5 inches    Hip Circumference 47 inches    Waist to Hip Ratio 0.95 %    BMI (Calculated) 32.08    Triceps Skinfold 8 mm    % Body Fat 28.1 %    Grip Strength 32 kg    Flexibility 12.75 in    Single Leg Stand 30 seconds            Nutrition Therapy Plan and Nutrition Goals:   Nutrition Assessments:   Nutrition Goals Re-Evaluation:   Nutrition Goals Discharge (Final Nutrition Goals Re-Evaluation):   Psychosocial: Target Goals: Acknowledge presence or absence of significant depression and/or stress, maximize coping skills, provide positive support system. Participant is able to verbalize types and ability to use techniques and skills needed for reducing stress and depression.  Initial Review & Psychosocial Screening:  Initial Psych Review & Screening - 10/01/19 1448      Initial Review   Current issues with None Identified      Family Dynamics   Good Support System? Yes   Tom lives alone but has the support of his friends who live near by. Tom's mom and siblings live out of state     Barriers   Psychosocial barriers to participate in program There are no identifiable barriers or psychosocial needs.      Screening Interventions   Interventions Encouraged to exercise           Quality of Life Scores:  Quality of Life - 10/01/19 1542      Quality of Life   Select Quality of Life      Quality of Life Scores   Health/Function Pre 25.43 %    Socioeconomic Pre 25.5 %    Psych/Spiritual Pre 24.92 %    Family Pre 29.17 %    GLOBAL Pre 25.71 %          Scores of 19 and below usually indicate a poorer quality of life in these areas.  A difference of  2-3  points is a clinically meaningful difference.  A difference of 2-3 points in the total score of the Quality of Life Index has been associated with significant improvement in overall quality of life, self-image, physical symptoms, and general health in studies assessing change in quality of life.  PHQ-9: Recent Review Flowsheet Data    [facility]   Decreased Interest 0   Down, Depressed, Hopeless 0   PHQ - 2 Score 0     Interpretation of Total Score  Total Score Depression Severity:  1-4 = Minimal depression, 5-9 = Mild depression, 10-14 = Moderate depression, 15-19 = Moderately severe depression, 20-27 = Severe depression   Psychosocial Evaluation and Intervention:   Psychosocial Re-Evaluation:  Psychosocial Re-Evaluation    Ro[place] 10/15/19 1216             Psychosocial Re-Evaluation   Current issues with None Identified       Interventions Encouraged to attend Cardiac Rehabilitation for  the exercise       Continue Psychosocial Services  No Follow up required              Psychosocial Discharge (Final Psychosocial Re-Evaluation):  Psychosocial Re-Evaluation - 10/15/19 1216      Psychosocial Re-Evaluation   Current issues with None Identified    Interventions Encouraged to attend Cardiac Rehabilitation for the exercise    Continue Psychosocial Services  No Follow up required           Vocational Rehabilitation: Provide vocational rehab assistance to qualifying candidates.   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 Participant will state understanding/return demonstration of topics  presented.  Learning Barriers/Preferences:  Learning Barriers/Preferences - 10/01/19 1542      Learning Barriers/Preferences   Learning Barriers Sight    Learning Preferences Written Material;Individual Instruction;Skilled Demonstration           Education Topics: Hypertension, Hypertension Reduction -Define heart disease and high blood pressure. Discus how high blood pressure affects the body and ways to reduce high blood pressure.   Exercise and Your Heart -Discuss why it is important to exercise, the FITT principles of exercise, normal and abnormal responses to exercise, and how to exercise safely.   Angina -Discuss definition of angina, causes of angina, treatment of angina, and how to decrease risk of having angina.   Cardiac Medications -Review what the following cardiac medications are used for, how they affect the body, and side effects that may occur when taking the medications.  Medications include Aspirin, Beta blockers, calcium channel blockers, ACE Inhibitors, angiotensin receptor blockers, diuretics, digoxin, and antihyperlipidemics.   Congestive Heart Failure -Discuss the definition of CHF, how to live with CHF, the signs and symptoms of CHF, and how keep track of weight and sodium intake.   Heart Disease and Intimacy -Discus the effect sexual activity has on the heart, how changes occur during intimacy as we age, and safety during sexual activity.   Smoking Cessation / COPD -Discuss different methods to quit smoking, the health benefits of quitting smoking, and the definition of COPD.   Nutrition I: Fats -Discuss the types of cholesterol, what cholesterol does to the heart, and how cholesterol levels can be controlled.   Nutrition II: Labels -Discuss the different components of food labels and how to read food label   Heart Parts/Heart Disease and PAD -Discuss the anatomy of the heart, the pathway of blood circulation through the heart, and these are  affected by heart disease.   Stress I: Signs and Symptoms -Discuss the causes of stress, how stress may lead to anxiety and depression, and ways to limit stress.   Stress II: Relaxation -Discuss different types of relaxation techniques to limit stress.   Warning Signs of Stroke / TIA -Discuss definition of a stroke, what the signs and symptoms are of a stroke, and how to identify when someone is having stroke.   Knowledge Questionnaire Score:  Knowledge Questionnaire Score - 10/01/19 1542      Knowledge Questionnaire Score   Pre Score 21/24           Core Components/Risk Factors/Patient  Goals at Admission:  Personal Goals and Risk Factors at Admission - 10/02/19 0837      Core Components/Risk Factors/Patient Goals on Admission    Weight Management Yes;Obesity;Weight Loss;Weight Maintenance    Intervention Weight Management: Develop a combined nutrition and exercise program designed to reach desired caloric intake, while maintaining appropriate intake of nutrient and fiber, sodium and fats, and appropriate energy expenditure required for the weight goal.;Weight Management: Provide education and appropriate resources to help participant work on and attain dietary goals.;Weight Management/Obesity: Establish reasonable short term and long term weight goals.;Obesity: Provide education and appropriate resources to help participant work on and attain dietary goals.    Diabetes Yes    Intervention Provide education about signs/symptoms and action to take for hypo/hyperglycemia.;Provide education about proper nutrition, including hydration, and aerobic/resistive exercise prescription along with prescribed medications to achieve blood glucose in normal ranges: Fasting glucose 65-99 mg/dL    Expected Outcomes Short Term: Participant verbalizes understanding of the signs/symptoms and immediate care of hyper/hypoglycemia, proper foot care and importance of medication, aerobic/resistive exercise  and nutrition plan for blood glucose control.;Long Term: Attainment of HbA1C < 7%.    Hypertension Yes    Intervention Provide education on lifestyle modifcations including regular physical activity/exercise, weight management, moderate sodium restriction and increased consumption of fresh fruit, vegetables, and low fat dairy, alcohol moderation, and smoking cessation.;Monitor prescription use compliance.    Expected Outcomes Short Term: Continued assessment and intervention until BP is < 140/40m HG in hypertensive participants. < 130/855mHG in hypertensive participants with diabetes, heart failure or chronic kidney disease.;Long Term: Maintenance of blood pressure at goal levels.           Core Components/Risk Factors/Patient Goals Review:   Goals and Risk Factor Review    Row Name 10/15/19 1216             Core Components/Risk Factors/Patient Goals Review   Personal Goals Review Weight Management/Obesity;Diabetes;Hypertension       Review Tom's vital signs and CBG's have been stable. ToGershon Mussels doing well with exercise.       Expected Outcomes ToGershon Musselill continue to participate in phase 2 cardiac rehab for exercise, nutrition and lifestyle modifications              Core Components/Risk Factors/Patient Goals at Discharge (Final Review):   Goals and Risk Factor Review - 10/15/19 1216      Core Components/Risk Factors/Patient Goals Review   Personal Goals Review Weight Management/Obesity;Diabetes;Hypertension    Review Tom's vital signs and CBG's have been stable. ToGershon Mussels doing well with exercise.    Expected Outcomes ToGershon Musselill continue to participate in phase 2 cardiac rehab for exercise, nutrition and lifestyle modifications           ITP Comments:  ITP Comments    Row Name 10/01/19 1535 10/15/19 1215         ITP Comments Dr. TrFransico HimMedical Director 30 Day ITP Review. ToGershon Musselas good participation and attendance in phase 2 cardiac rehab. ToGershon Mussels off to a good start to  exercise.             Comments: See ITP comments.MaBarnet PallRN,BSN 10/15/2019 12:20 PM

## 2019-10-16 ENCOUNTER — Other Ambulatory Visit: Payer: Self-pay

## 2019-10-16 ENCOUNTER — Encounter (HOSPITAL_COMMUNITY)
Admission: RE | Admit: 2019-10-16 | Discharge: 2019-10-16 | Disposition: A | Discharge status: Home / Self Care | Payer: 59 | Source: Ambulatory Visit | Attending: Cardiology | Admitting: Cardiology

## 2019-10-16 DIAGNOSIS — Z952 Presence of prosthetic heart valve: Secondary | ICD-10-CM

## 2019-10-17 NOTE — Progress Notes (Signed)
Lilburn Straw 54 y.o. male Nutrition Note  Visit Diagnosis: 08/06/19 AVR, Redo Sternotomy for bleeding   Past Medical History:  Diagnosis Date  . Aortic valve stenosis 08/06/2019  . Ascites 07/15/2019  . Cellulitis and abscess of left lower extremity 07/21/2019  . Chronic combined systolic (congestive) and diastolic (congestive) heart failure ([place])   . Cirrhosis of liver with ascites ([place])   . Critical aortic valve stenosis   . Hepatic cirrhosis ([place]) 07/15/2019  . Hyperglycemia 07/21/2019  . Left kidney mass 07/15/2019  . Morbid obesity ([place])   . New onset of congestive heart failure ([place]) 07/21/2019  . Pleural effusion   . Pleural effusion on right 07/15/2019  . S/P aortic valve replacement with bioprosthetic valve 08/06/2019   Size 25 mm  . S/P AVR 08/07/2019  . Severe aortic stenosis   . Splenomegaly 07/15/2019  . Systolic ejection murmur 7/74/1287     Medications reviewed.   Current Outpatient Medications:  .  acetaminophen (TYLENOL) 325 MG tablet, Take 2 tablets (650 mg total) by mouth every 6 (six) hours as needed for mild pain., Disp: , Rfl:  .  Amino Acids-Protein Hydrolys (FEEDING SUPPLEMENT, PRO-STAT SUGAR FREE 64,) LIQD, Take 30 mLs by mouth daily., Disp: 887 mL, Rfl: 0 .  amoxicillin (AMOXIL) 500 MG capsule, Take 4 capsules by mouth 30-60 minutes before dental procedures, Disp: 4 capsule, Rfl: 2 .  aspirin EC 325 MG EC tablet, Take 1 tablet (325 mg total) by mouth daily., Disp: 30 tablet, Rfl: 0 .  carvedilol (COREG) 3.125 MG tablet, Take 1 tablet (3.125 mg total) by mouth 2 (two) times daily with a meal., Disp: 180 tablet, Rfl: 3 .  furosemide (LASIX) 40 MG tablet, Take 1 tablet (40 mg total) by mouth daily., Disp: 90 tablet, Rfl: 3 .  levothyroxine (SYNTHROID) 25 MCG tablet, Take 1 tablet (25 mcg total) by mouth daily at 6 (six) AM., Disp: 30 tablet, Rfl: 4 .  metFORMIN (GLUCOPHAGE) 500 MG tablet, Take 1 tablet (500 mg total) by mouth 2 (two) times daily with a meal., Disp: 60  tablet, Rfl: 4 .  Multiple Vitamin (MULTIVITAMIN) tablet, Take 1 tablet by mouth daily., Disp: , Rfl:    Ht Readings from Last 1 Encounters:  10/09/19 5' 10.75" (1.797 m)     Wt Readings from Last 3 Encounters:  10/09/19 232 lb 9.6 oz (105.5 kg)  10/01/19 (!) 228 lb 6.3 oz (103.6 kg)  09/16/19 226 lb (102.5 kg)     There is no height or weight on file to calculate BMI.   Social History   Tobacco Use  Smoking Status Never Smoker  Smokeless Tobacco Never Used     Lab Results  Component Value Date   CHOL 128 07/21/2019   Lab Results  Component Value Date   HDL 37 (L) 07/21/2019   Lab Results  Component Value Date   LDLCALC 74 07/21/2019   Lab Results  Component Value Date   TRIG 87 07/21/2019     Lab Results  Component Value Date   HGBA1C 6.1 (H) 08/06/2019     CBG (last 3)  No results for input(s): GLUCAP in the last 72 hours.   Nutrition Note  Spoke with pt. Nutrition Plan and Nutrition Survey goals reviewed with pt. Pt is following a Heart Healthy diet.  He is mindful of limiting sodium. He verbalized heart healthy sodium recommendations. He had many questions regarding nutrition. Provided information about sugar, artificial sweeteners, sugar alcohols.  Pt has  Pre-diabetes. Last A1c indicates blood glucose well-controlled.   Pt expressed understanding of the information reviewed.  Nutrition Diagnosis ? Food-and nutrition-related knowledge deficit related to lack of exposure to information as related to diagnosis of: ? CVD ? Pre-diabetes  Nutrition Intervention ? Pt's individual nutrition plan reviewed with pt. ? Benefits of adopting Heart Healthy diet discussed when Med[employer]icts reviewed.   ? Continue client-centered nutrition education by RD, as part of interdisciplinary care.  Goal(s) ? Pt to build a healthy plate including vegetables, fruits, whole grains, and low-fat dairy products in a heart healthy meal plan. ? Pt able to name foods that affect  blood glucose  Plan:   Will provide client-centered nutrition education as part of interdisciplinary care  Monitor and evaluate progress toward nutrition goal with team.   Michaele Offer, MS, RDN, LDN

## 2019-10-19 ENCOUNTER — Other Ambulatory Visit: Payer: Self-pay

## 2019-10-19 ENCOUNTER — Encounter (HOSPITAL_COMMUNITY)
Admission: RE | Admit: 2019-10-19 | Discharge: 2019-10-19 | Disposition: A | Discharge status: Home / Self Care | Payer: 59 | Source: Ambulatory Visit | Attending: Cardiology | Admitting: Cardiology

## 2019-10-19 DIAGNOSIS — Z952 Presence of prosthetic heart valve: Secondary | ICD-10-CM

## 2019-10-20 ENCOUNTER — Ambulatory Visit (HOSPITAL_COMMUNITY): Payer: 59 | Attending: Internal Medicine

## 2019-10-20 DIAGNOSIS — I350 Nonrheumatic aortic (valve) stenosis: Secondary | ICD-10-CM | POA: Diagnosis not present

## 2019-10-20 DIAGNOSIS — I5042 Chronic combined systolic (congestive) and diastolic (congestive) heart failure: Secondary | ICD-10-CM | POA: Diagnosis present

## 2019-10-20 DIAGNOSIS — Z952 Presence of prosthetic heart valve: Secondary | ICD-10-CM

## 2019-10-20 LAB — ECHOCARDIOGRAM COMPLETE
AV Mean grad: 13 mmHg
AV Peak grad: 24.4 mmHg
Ao pk vel: 2.47 m/s
Area-P 1/2: 3.42 cm2
S' Lateral: 3.5 cm

## 2019-10-20 NOTE — Progress Notes (Signed)
Reviewed home exercise Rx with patient today. Pt voices he is currently walking at home 3-5 x/wk for 20 minutes. Discussed with patient walking 3-4x/wk for 30 minutes. Pt agrees to this. Discussed components of safe exercise program. He ws encouraged to always take his prescribed. medications before engaging in exercise. Stressed to patient to carry cellphone with walking outdoors. Weather parameters for temperature and humidity reviewed. Hydration before, during and after exercise stressed. THRR of 66-133 reviewed. Warm-up and cool-down encouraged as part of his exercise regime. Reviewed S/S that would require patient to terminate exercise and when to call MD vs 911. Pt verbalized understanding of the home exercise RX and was provided  a copy.   Lesly Rubenstein MS, ACSM-EP-C, CCRP

## 2019-10-21 ENCOUNTER — Encounter: Payer: Self-pay | Admitting: Physician Assistant

## 2019-10-21 ENCOUNTER — Other Ambulatory Visit: Payer: Self-pay

## 2019-10-21 ENCOUNTER — Encounter (HOSPITAL_COMMUNITY)
Admission: RE | Admit: 2019-10-21 | Discharge: 2019-10-21 | Disposition: A | Discharge status: Home / Self Care | Payer: 59 | Source: Ambulatory Visit | Attending: Cardiology | Admitting: Cardiology

## 2019-10-21 DIAGNOSIS — Z952 Presence of prosthetic heart valve: Secondary | ICD-10-CM | POA: Diagnosis not present

## 2019-10-23 ENCOUNTER — Other Ambulatory Visit: Payer: Self-pay

## 2019-10-23 ENCOUNTER — Encounter (HOSPITAL_COMMUNITY)
Admission: RE | Admit: 2019-10-23 | Discharge: 2019-10-23 | Disposition: A | Discharge status: Home / Self Care | Payer: 59 | Source: Ambulatory Visit | Attending: Cardiology | Admitting: Cardiology

## 2019-10-23 DIAGNOSIS — Z952 Presence of prosthetic heart valve: Secondary | ICD-10-CM | POA: Diagnosis not present

## 2019-10-26 ENCOUNTER — Encounter (HOSPITAL_COMMUNITY): Payer: 59

## 2019-10-28 ENCOUNTER — Other Ambulatory Visit: Payer: Self-pay

## 2019-10-28 ENCOUNTER — Encounter (HOSPITAL_COMMUNITY)
Admission: RE | Admit: 2019-10-28 | Discharge: 2019-10-28 | Disposition: A | Discharge status: Home / Self Care | Payer: 59 | Source: Ambulatory Visit | Attending: Cardiology | Admitting: Cardiology

## 2019-10-28 DIAGNOSIS — Z952 Presence of prosthetic heart valve: Secondary | ICD-10-CM

## 2019-10-30 ENCOUNTER — Other Ambulatory Visit: Payer: Self-pay

## 2019-10-30 ENCOUNTER — Encounter (HOSPITAL_COMMUNITY)
Admission: RE | Admit: 2019-10-30 | Discharge: 2019-10-30 | Disposition: A | Discharge status: Home / Self Care | Payer: 59 | Source: Ambulatory Visit | Attending: Cardiology | Admitting: Cardiology

## 2019-10-30 DIAGNOSIS — Z952 Presence of prosthetic heart valve: Secondary | ICD-10-CM

## 2019-11-02 ENCOUNTER — Encounter (HOSPITAL_COMMUNITY)
Admission: RE | Admit: 2019-11-02 | Discharge: 2019-11-02 | Disposition: A | Discharge status: Home / Self Care | Payer: 59 | Source: Ambulatory Visit | Attending: Cardiology | Admitting: Cardiology

## 2019-11-02 ENCOUNTER — Other Ambulatory Visit: Payer: Self-pay

## 2019-11-02 DIAGNOSIS — Z952 Presence of prosthetic heart valve: Secondary | ICD-10-CM | POA: Diagnosis not present

## 2019-11-04 ENCOUNTER — Encounter (HOSPITAL_COMMUNITY)
Admission: RE | Admit: 2019-11-04 | Discharge: 2019-11-04 | Disposition: A | Discharge status: Home / Self Care | Payer: 59 | Source: Ambulatory Visit | Attending: Cardiology | Admitting: Cardiology

## 2019-11-04 ENCOUNTER — Other Ambulatory Visit: Payer: Self-pay

## 2019-11-04 DIAGNOSIS — Z952 Presence of prosthetic heart valve: Secondary | ICD-10-CM

## 2019-11-06 ENCOUNTER — Other Ambulatory Visit: Payer: Self-pay

## 2019-11-06 ENCOUNTER — Encounter (HOSPITAL_COMMUNITY)
Admission: RE | Admit: 2019-11-06 | Discharge: 2019-11-06 | Disposition: A | Discharge status: Home / Self Care | Payer: 59 | Source: Ambulatory Visit | Attending: Cardiology | Admitting: Cardiology

## 2019-11-06 DIAGNOSIS — Z952 Presence of prosthetic heart valve: Secondary | ICD-10-CM

## 2019-11-11 ENCOUNTER — Other Ambulatory Visit: Payer: Self-pay

## 2019-11-11 ENCOUNTER — Encounter (HOSPITAL_COMMUNITY)
Admission: RE | Admit: 2019-11-11 | Discharge: 2019-11-11 | Disposition: A | Discharge status: Home / Self Care | Payer: 59 | Source: Ambulatory Visit | Attending: Cardiology | Admitting: Cardiology

## 2019-11-11 DIAGNOSIS — Z952 Presence of prosthetic heart valve: Secondary | ICD-10-CM

## 2019-11-12 NOTE — Progress Notes (Signed)
Cardiac Individual Treatment Plan  Patient Details  Name: Steve Richards MRN: [id number] Date of Birth: 1965/09/18 Referring Provider:     [facility] from 10/01/2019 in [facility]  Referring Provider Jerline Pain MD      Initial Encounter Date:    CARDIAC REHAB PHASE II ORIENTATION from 10/01/2019 in [facility]  Date 10/01/19      Visit Diagnosis: 08/06/19 AVR, Redo Sternotomy for bleeding  Patient's Home Medications on Admission:  Current Outpatient Medications:  .  acetaminophen (TYLENOL) 325 MG tablet, Take 2 tablets (650 mg total) by mouth every 6 (six) hours as needed for mild pain., Disp: , Rfl:  .  Amino Acids-Protein Hydrolys (FEEDING SUPPLEMENT, PRO-STAT SUGAR FREE 64,) LIQD, Take 30 mLs by mouth daily., Disp: 887 mL, Rfl: 0 .  amoxicillin (AMOXIL) 500 MG capsule, Take 4 capsules by mouth 30-60 minutes before dental procedures, Disp: 4 capsule, Rfl: 2 .  aspirin EC 325 MG EC tablet, Take 1 tablet (325 mg total) by mouth daily., Disp: 30 tablet, Rfl: 0 .  carvedilol (COREG) 3.125 MG tablet, Take 1 tablet (3.125 mg total) by mouth 2 (two) times daily with a meal., Disp: 180 tablet, Rfl: 3 .  furosemide (LASIX) 40 MG tablet, Take 1 tablet (40 mg total) by mouth daily., Disp: 90 tablet, Rfl: 3 .  levothyroxine (SYNTHROID) 25 MCG tablet, Take 1 tablet (25 mcg total) by mouth daily at 6 (six) AM., Disp: 30 tablet, Rfl: 4 .  metFORMIN (GLUCOPHAGE) 500 MG tablet, Take 1 tablet (500 mg total) by mouth 2 (two) times daily with a meal., Disp: 60 tablet, Rfl: 4 .  Multiple Vitamin (MULTIVITAMIN) tablet, Take 1 tablet by mouth daily., Disp: , Rfl:   Past Medical History: Past Medical History:  Diagnosis Date  . Ascites 07/15/2019  . Cellulitis and abscess of left lower extremity 07/21/2019  . Chronic combined systolic (congestive) and diastolic (congestive) heart failure ([place])   . Cirrhosis of liver  with ascites ([place])   . Critical aortic valve stenosis   . Hepatic cirrhosis ([place]) 07/15/2019  . Hyperglycemia 07/21/2019  . Left kidney mass 07/15/2019  . Morbid obesity ([place])   . Pleural effusion   . Pleural effusion on right 07/15/2019  . S/P aortic valve replacement with bioprosthetic valve 08/06/2019   Size 25 mm // Echocardiogram 8/21: EF 55-60, mod LVH, Gr 2 DD, normal RVSF, mod LAE, trivial MR, AVR w mean 13 mmHg and no PVL  . Severe aortic stenosis   . Splenomegaly 07/15/2019  . Systolic ejection murmur 08/22/5091    Tobacco Use: Social History   Tobacco Use  Smoking Status Never Smoker  Smokeless Tobacco Never Used    Labs: Recent Review Flowsheet Data    Labs for ITP Cardiac and Pulmonary Rehab Latest Ref Rng & Units 08/07/2019 08/07/2019 08/08/2019 08/09/2019 08/10/2019   Cholestrol 0 - 200 mg/dL - - - - -   LDLCALC 0 - 99 mg/dL - - - - -   HDL >40 mg/dL - - - - -   Trlycerides <150 mg/dL - - - - -   Hemoglobin A1c 4.8 - 5.6 % - - - - -   PHART 7.35 - 7.45 7.441 - - - -   PCO2ART 32 - 48 mmHg 38.6 - - - -   HCO3 20.0 - 28.0 mmol/L 26.5 - - - -   TCO2 22 - 32 mmol/L 28 - - - -  ACIDBASEDEF 0.0 - 2.0 mmol/L - - - - -   O2SAT % 99.0 49.8 73.6 71.1 73.8      Capillary Blood Glucose: Lab Results  Component Value Date   GLUCAP 87 10/12/2019   GLUCAP 146 (H) 10/12/2019   GLUCAP 92 10/05/2019   GLUCAP 121 (H) 10/01/2019   GLUCAP 142 (H) 08/17/2019     Exercise Target Goals: Exercise Program Goal: Individual exercise prescription set using results from initial 6 min walk test and THRR while considering  patient's activity barriers and safety.   Exercise Prescription Goal: Starting with aerobic activity 30 plus minutes a day, 3 days per week for initial exercise prescription. Provide home exercise prescription and guidelines that participant acknowledges understanding prior to discharge.  Activity Barriers & Risk Stratification:  Activity Barriers & Cardiac Risk  Stratification - 10/01/19 1539      Activity Barriers & Cardiac Risk Stratification   Activity Barriers Muscular Weakness;Back Problems;Joint Problems    Cardiac Risk Stratification High           6 Minute Walk:  6 Minute Walk    Row Name 10/01/19 1535         6 Minute Walk   Phase Initial     Distance 1559 feet     Walk Time 6 minutes     # of Rest Breaks 0     MPH 2.95     METS 3.88     RPE 12     Perceived Dyspnea  0     VO2 Peak 13.59     Symptoms No     Resting HR 88 bpm     Resting BP 104/68     Resting Oxygen Saturation  98 %     Exercise Oxygen Saturation  during 6 min walk 96 %     Max Ex. HR 106 bpm     Max Ex. BP 110/70     2 Minute Post BP 104/66            Oxygen Initial Assessment:   Oxygen Re-Evaluation:   Oxygen Discharge (Final Oxygen Re-Evaluation):   Initial Exercise Prescription:  Initial Exercise Prescription - 10/01/19 1600      Date of Initial Exercise RX and Referring Provider   Date 10/01/19    Referring Provider Jerline Pain MD    Expected Discharge Date 11/27/19      Recumbant Bike   Level 2    Watts 25    Minutes 15    METs 2.5      NuStep   Level 2    SPM 75    Minutes 15    METs 2.5      Prescription Details   Frequency (times per week) 3x    Duration Progress to 30 minutes of continuous aerobic without signs/symptoms of physical distress      Intensity   THRR 40-80% of Max Heartrate 66-133    Ratings of Perceived Exertion 11-13    Perceived Dyspnea 0-4      Progression   Progression Continue progressive overload as per policy without signs/symptoms or physical distress.      Resistance Training   Training Prescription Yes    Weight 3lbs    Reps 10-15           Perform Capillary Blood Glucose checks as needed.  Exercise Prescription Changes:  Exercise Prescription Changes    Row Name 10/05/19 1645 10/19/19 1610 11/04/19 1644  Response to Exercise   Blood Pressure (Admit) 116/64  106/62 106/58     Blood Pressure (Exercise) 150/84 128/70 122/60     Blood Pressure (Exit) 106/68 122/70 98/60     Heart Rate (Admit) 86 bpm 87 bpm 98 bpm     Heart Rate (Exercise) 94 bpm 93 bpm 127 bpm     Heart Rate (Exit) 76 bpm 68 bpm 81 bpm     Rating of Perceived Exertion (Exercise) 12 11 13      Symptoms None None None     Comments Pt's first day of exercise in the CRP2 program.  Reviewed METs and Home Exercise Rx Reviewed METs and Goals     Duration Progress to 30 minutes of  aerobic without signs/symptoms of physical distress Progress to 30 minutes of  aerobic without signs/symptoms of physical distress Progress to 30 minutes of  aerobic without signs/symptoms of physical distress     Intensity THRR unchanged THRR unchanged THRR unchanged       Progression   Progression Continue to progress workloads to maintain intensity without signs/symptoms of physical distress. Continue to progress workloads to maintain intensity without signs/symptoms of physical distress. Continue to progress workloads to maintain intensity without signs/symptoms of physical distress.     Average METs -- 3.2 4       Resistance Training   Training Prescription Yes Yes No     Weight 3lbs 3 lbs --  No weights done on Wednesday. Uses 5 lbs M, F     Reps 10-15 10-15 --     Time 10 Minutes 10 Minutes --       Interval Training   Interval Training No No No       Bike   Level 2 3 4      Minutes 15 15 15      METs 3.2 4.2 5.9       NuStep   Level 2 3 6      SPM 75 80 75     Minutes 15 15 15      METs 2.2 2.2 2.1       Home Exercise Plan   Plans to continue exercise at -- Home (comment) Home (comment)     Frequency -- Add 3 additional days to program exercise sessions. Add 3 additional days to program exercise sessions.     Initial Home Exercises Provided -- 10/19/19 10/19/19            Exercise Comments:  Exercise Comments    Row Name 10/05/19 1645 10/19/19 1630 11/04/19 1640       Exercise  Comments Pt's first day of exercise in CRP2 program. Tolerated session well. Reviewed METs and home exercise Rx. Pt has been walking at home and will continue with 3-4x/wk for 30 minutes. Reviewed METs and Goals. Pt making good progess in CRP2 program. Continues to walk at home.            Exercise Goals and Review:  Exercise Goals    Row Name 10/01/19 1541             Exercise Goals   Increase Physical Activity Yes       Intervention Provide advice, education, support and counseling about physical activity/exercise needs.;Develop an individualized exercise prescription for aerobic and resistive training based on initial evaluation findings, risk stratification, comorbidities and participant's personal goals.       Expected Outcomes Long Term: Add in home exercise to make exercise part of routine and to increase  amount of physical activity.;Short Term: Attend rehab on a regular basis to increase amount of physical activity.;Long Term: Exercising regularly at least 3-5 days a week.       Increase Strength and Stamina Yes       Intervention Provide advice, education, support and counseling about physical activity/exercise needs.;Develop an individualized exercise prescription for aerobic and resistive training based on initial evaluation findings, risk stratification, comorbidities and participant's personal goals.       Expected Outcomes Short Term: Increase workloads from initial exercise prescription for resistance, speed, and METs.;Short Term: Perform resistance training exercises routinely during rehab and add in resistance training at home;Long Term: Improve cardiorespiratory fitness, muscular endurance and strength as measured by increased METs and functional capacity (6MWT)       Able to understand and use rate of perceived exertion (RPE) scale Yes       Intervention Provide education and explanation on how to use RPE scale       Expected Outcomes Short Term: Able to use RPE daily in  rehab to express subjective intensity level;Long Term:  Able to use RPE to guide intensity level when exercising independently       Knowledge and understanding of Target Heart Rate Range (THRR) Yes       Intervention Provide education and explanation of THRR including how the numbers were predicted and where they are located for reference       Expected Outcomes Short Term: Able to state/look up THRR;Long Term: Able to use THRR to govern intensity when exercising independently;Short Term: Able to use daily as guideline for intensity in rehab       Able to check pulse independently Yes       Intervention Provide education and demonstration on how to check pulse in carotid and radial arteries.;Review the importance of being able to check your own pulse for safety during independent exercise       Expected Outcomes Short Term: Able to explain why pulse checking is important during independent exercise;Long Term: Able to check pulse independently and accurately       Understanding of Exercise Prescription Yes       Intervention Provide education, explanation, and written materials on patient's individual exercise prescription       Expected Outcomes Short Term: Able to explain program exercise prescription;Long Term: Able to explain home exercise prescription to exercise independently              Exercise Goals Re-Evaluation :  Exercise Goals Re-Evaluation    Row Name 10/05/19 1645 10/19/19 1630 11/04/19 1640         Exercise Goal Re-Evaluation   Exercise Goals Review Increase Physical Activity;Increase Strength and Stamina;Able to understand and use rate of perceived exertion (RPE) scale;Knowledge and understanding of Target Heart Rate Range (THRR);Understanding of Exercise Prescription Increase Physical Activity;Increase Strength and Stamina;Able to understand and use rate of perceived exertion (RPE) scale;Knowledge and understanding of Target Heart Rate Range (THRR);Able to check pulse  independently;Understanding of Exercise Prescription Increase Strength and Stamina;Increase Physical Activity;Able to understand and use rate of perceived exertion (RPE) scale;Knowledge and understanding of Target Heart Rate Range (THRR);Able to check pulse independently;Understanding of Exercise Prescription     Comments Pt's first day of exercise in the CRP2 program. Pt tolerated initial exercsie session well. Pt understands exercise RX, THRR, and RPE scale, Due to time patient wasunable to do free weights today. Reviewed METs and Home Exercise Rx with patient today. Pt is walking at home  currently 3-5x/week. Pt veralized understanding of the home exercise Rx and was provided copy. Pt is making good progress in the CRP2 program. Pt has good attendance. Pt continues to walk at home 3-5x/wk.     Expected Outcomes Will continue to monitor patients and progress workloads as tolerated. Pt will walk at home 3-4x/week in addtion to his CRP2 program and abide by the Rx guidelines. Pt will continue to progress as tolerated in the CRP2 program and walk at home.             Discharge Exercise Prescription (Final Exercise Prescription Changes):  Exercise Prescription Changes - 11/04/19 1644      Response to Exercise   Blood Pressure (Admit) 106/58    Blood Pressure (Exercise) 122/60    Blood Pressure (Exit) 98/60    Heart Rate (Admit) 98 bpm    Heart Rate (Exercise) 127 bpm    Heart Rate (Exit) 81 bpm    Rating of Perceived Exertion (Exercise) 13    Symptoms None    Comments Reviewed METs and Goals    Duration Progress to 30 minutes of  aerobic without signs/symptoms of physical distress    Intensity THRR unchanged      Progression   Progression Continue to progress workloads to maintain intensity without signs/symptoms of physical distress.    Average METs 4      Resistance Training   Training Prescription No    Weight --   No weights done on Wednesday. Uses 5 lbs M, F     Interval Training     Interval Training No      Bike   Level 4    Minutes 15    METs 5.9      NuStep   Level 6    SPM 75    Minutes 15    METs 2.1      Home Exercise Plan   Plans to continue exercise at Home (comment)    Frequency Add 3 additional days to program exercise sessions.    Initial Home Exercises Provided 10/19/19           Nutrition:  Target Goals: Understanding of nutrition guidelines, daily intake of sodium <1536m, cholesterol <2057m calories 30% from fat and 7% or less from saturated fats, daily to have 5 or more servings of fruits and vegetables.  Biometrics:  Pre Biometrics - 10/01/19 1538      Pre Biometrics   Height 5' 10.75" (1.797 m)    Weight 103.6 kg    Waist Circumference 44.5 inches    Hip Circumference 47 inches    Waist to Hip Ratio 0.95 %    BMI (Calculated) 32.08    Triceps Skinfold 8 mm    % Body Fat 28.1 %    Grip Strength 32 kg    Flexibility 12.75 in    Single Leg Stand 30 seconds            Nutrition Therapy Plan and Nutrition Goals:  Nutrition Therapy & Goals - 11/12/19 0708      Nutrition Therapy   Diet Heart Healthy      Personal Nutrition Goals   Nutrition Goal Pt to build a healthy plate including vegetables, fruits, whole grains, and low-fat dairy products in a heart healthy meal plan.    Personal Goal #2 Pt able to name foods that affect blood glucose      Intervention Plan   Intervention Prescribe, educate and counsel regarding individualized specific dietary modifications  aiming towards targeted core components such as weight, hypertension, lipid management, diabetes, heart failure and other comorbidities.    Expected Outcomes Short Term Goal: Understand basic principles of dietary content, such as calories, fat, sodium, cholesterol and nutrients.           Nutrition Assessments:  Nutrition Assessments - 11/05/19 1104      MEDFICTS Scores   Pre Score 63           Nutrition Goals Re-Evaluation:  Nutrition Goals  Re-Evaluation    Row Name 11/12/19 0711             Goals   Current Weight 232 lb (105.2 kg)       Nutrition Goal Pt to build a healthy plate including vegetables, fruits, whole grains, and low-fat dairy products in a heart healthy meal plan.         Personal Goal #2 Re-Evaluation   Personal Goal #2 Pt able to name foods that affect blood glucose              Nutrition Goals Discharge (Final Nutrition Goals Re-Evaluation):  Nutrition Goals Re-Evaluation - 11/12/19 0711      Goals   Current Weight 232 lb (105.2 kg)    Nutrition Goal Pt to build a healthy plate including vegetables, fruits, whole grains, and low-fat dairy products in a heart healthy meal plan.      Personal Goal #2 Re-Evaluation   Personal Goal #2 Pt able to name foods that affect blood glucose           Psychosocial: Target Goals: Acknowledge presence or absence of significant depression and/or stress, maximize coping skills, provide positive support system. Participant is able to verbalize types and ability to use techniques and skills needed for reducing stress and depression.  Initial Review & Psychosocial Screening:  Initial Psych Review & Screening - 10/01/19 1448      Initial Review   Current issues with None Identified      Family Dynamics   Good Support System? Yes   Steve Richards lives alone but has the support of his friends who live near by. Steve Richards's mom and siblings live out of state     Barriers   Psychosocial barriers to participate in program There are no identifiable barriers or psychosocial needs.      Screening Interventions   Interventions Encouraged to exercise           Quality of Life Scores:  Quality of Life - 10/01/19 1542      Quality of Life   Select Quality of Life      Quality of Life Scores   Health/Function Pre 25.43 %    Socioeconomic Pre 25.5 %    Psych/Spiritual Pre 24.92 %    Family Pre 29.17 %    GLOBAL Pre 25.71 %          Scores of 19 and below usually  indicate a poorer quality of life in these areas.  A difference of  2-3 points is a clinically meaningful difference.  A difference of 2-3 points in the total score of the Quality of Life Index has been associated with significant improvement in overall quality of life, self-image, physical symptoms, and general health in studies assessing change in quality of life.  PHQ-9: Recent Review Flowsheet Data    Depression screen [facility] 2/9 10/01/2019   Decreased Interest 0   Down, Depressed, Hopeless 0   PHQ - 2 Score 0  Interpretation of Total Score  Total Score Depression Severity:  1-4 = Minimal depression, 5-9 = Mild depression, 10-14 = Moderate depression, 15-19 = Moderately severe depression, 20-27 = Severe depression   Psychosocial Evaluation and Intervention:   Psychosocial Re-Evaluation:  Psychosocial Re-Evaluation    Row Name 10/15/19 1216 11/12/19 1600           Psychosocial Re-Evaluation   Current issues with None Identified None Identified      Interventions Encouraged to attend Cardiac Rehabilitation for the exercise Encouraged to attend Cardiac Rehabilitation for the exercise      Continue Psychosocial Services  No Follow up required No Follow up required             Psychosocial Discharge (Final Psychosocial Re-Evaluation):  Psychosocial Re-Evaluation - 11/12/19 1600      Psychosocial Re-Evaluation   Current issues with None Identified    Interventions Encouraged to attend Cardiac Rehabilitation for the exercise    Continue Psychosocial Services  No Follow up required           Vocational Rehabilitation: Provide vocational rehab assistance to qualifying candidates.   Vocational Rehab Evaluation & Intervention:  Vocational Rehab - 10/01/19 1449      Initial Vocational Rehab Evaluation & Intervention   Assessment shows need for Vocational Rehabilitation No   Steve Richards is a [employer] and does not need vocational rehab at this time           Education: Education Goals: Education classes will be provided on a weekly basis, covering required topics. Participant will state understanding/return demonstration of topics presented.  Learning Barriers/Preferences:  Learning Barriers/Preferences - 10/01/19 1542      Learning Barriers/Preferences   Learning Barriers Sight    Learning Preferences Written Material;Individual Instruction;Skilled Demonstration           Education Topics: Hypertension, Hypertension Reduction -Define heart disease and high blood pressure. Discus how high blood pressure affects the body and ways to reduce high blood pressure.   Exercise and Your Heart -Discuss why it is important to exercise, the FITT principles of exercise, normal and abnormal responses to exercise, and how to exercise safely.   Angina -Discuss definition of angina, causes of angina, treatment of angina, and how to decrease risk of having angina.   Cardiac Medications -Review what the following cardiac medications are used for, how they affect the body, and side effects that may occur when taking the medications.  Medications include Aspirin, Beta blockers, calcium channel blockers, ACE Inhibitors, angiotensin receptor blockers, diuretics, digoxin, and antihyperlipidemics.   Congestive Heart Failure -Discuss the definition of CHF, how to live with CHF, the signs and symptoms of CHF, and how keep track of weight and sodium intake.   Heart Disease and Intimacy -Discus the effect sexual activity has on the heart, how changes occur during intimacy as we age, and safety during sexual activity.   Smoking Cessation / COPD -Discuss different methods to quit smoking, the health benefits of quitting smoking, and the definition of COPD.   Nutrition I: Fats -Discuss the types of cholesterol, what cholesterol does to the heart, and how cholesterol levels can be controlled.   Nutrition II: Labels -Discuss the different components  of food labels and how to read food label   Heart Parts/Heart Disease and PAD -Discuss the anatomy of the heart, the pathway of blood circulation through the heart, and these are affected by heart disease.   Stress I: Signs and Symptoms -Discuss the causes of stress,  how stress may lead to anxiety and depression, and ways to limit stress.   Stress II: Relaxation -Discuss different types of relaxation techniques to limit stress.   Warning Signs of Stroke / TIA -Discuss definition of a stroke, what the signs and symptoms are of a stroke, and how to identify when someone is having stroke.   Knowledge Questionnaire Score:  Knowledge Questionnaire Score - 10/01/19 1542      Knowledge Questionnaire Score   Pre Score 21/24           Core Components/Risk Factors/Patient Goals at Admission:  Personal Goals and Risk Factors at Admission - 10/02/19 0837      Core Components/Risk Factors/Patient Goals on Admission    Weight Management Yes;Obesity;Weight Loss;Weight Maintenance    Intervention Weight Management: Develop a combined nutrition and exercise program designed to reach desired caloric intake, while maintaining appropriate intake of nutrient and fiber, sodium and fats, and appropriate energy expenditure required for the weight goal.;Weight Management: Provide education and appropriate resources to help participant work on and attain dietary goals.;Weight Management/Obesity: Establish reasonable short term and long term weight goals.;Obesity: Provide education and appropriate resources to help participant work on and attain dietary goals.    Diabetes Yes    Intervention Provide education about signs/symptoms and action to take for hypo/hyperglycemia.;Provide education about proper nutrition, including hydration, and aerobic/resistive exercise prescription along with prescribed medications to achieve blood glucose in normal ranges: Fasting glucose 65-99 mg/dL    Expected Outcomes Short  Term: Participant verbalizes understanding of the signs/symptoms and immediate care of hyper/hypoglycemia, proper foot care and importance of medication, aerobic/resistive exercise and nutrition plan for blood glucose control.;Long Term: Attainment of HbA1C < 7%.    Hypertension Yes    Intervention Provide education on lifestyle modifcations including regular physical activity/exercise, weight management, moderate sodium restriction and increased consumption of fresh fruit, vegetables, and low fat dairy, alcohol moderation, and smoking cessation.;Monitor prescription use compliance.    Expected Outcomes Short Term: Continued assessment and intervention until BP is < 140/4m HG in hypertensive participants. < 130/814mHG in hypertensive participants with diabetes, heart failure or chronic kidney disease.;Long Term: Maintenance of blood pressure at goal levels.           Core Components/Risk Factors/Patient Goals Review:   Goals and Risk Factor Review    Row Name 10/15/19 1216 11/12/19 1600           Core Components/Risk Factors/Patient Goals Review   Personal Goals Review Weight Management/Obesity;Diabetes;Hypertension Weight Management/Obesity;Diabetes;Hypertension      Review Steve Richards's vital signs and CBG's have been stable. [place] doing well with exercise. Steve Richards's vital signs and CBG's have been stable. [place] doing well with exercise.      Expected Outcomes [place] continue to participate in phase 2 cardiac rehab for exercise, nutrition and lifestyle modifications [place] continue to participate in phase 2 cardiac rehab for exercise, nutrition and lifestyle modifications             Core Components/Risk Factors/Patient Goals at Discharge (Final Review):   Goals and Risk Factor Review - 11/12/19 1600      Core Components/Risk Factors/Patient Goals Review   Personal Goals Review Weight Management/Obesity;Diabetes;Hypertension    Review Steve Richards's vital signs and CBG's have been stable. [place]  doing well with exercise.    Expected Outcomes [place] continue to participate in phase 2 cardiac rehab for exercise, nutrition and lifestyle modifications  ITP Comments:  ITP Comments    Row Name 10/01/19 1535 10/15/19 1215 11/12/19 1559       ITP Comments Dr. Fransico Him, Medical Director 30 Day ITP Review. Steve Richards has good participation and attendance in phase 2 cardiac rehab. Steve Richards is off to a good start to exercise. 30 Day ITP Review. Steve Richards has good participation and attendance in  phase 2 cardiac rehab            Comments: See ITP Comments.Barnet Pall, RN,BSN 11/12/2019 4:02 PM

## 2019-11-13 ENCOUNTER — Other Ambulatory Visit: Payer: Self-pay

## 2019-11-13 ENCOUNTER — Encounter (HOSPITAL_COMMUNITY)
Admission: RE | Admit: 2019-11-13 | Discharge: 2019-11-13 | Disposition: A | Discharge status: Home / Self Care | Payer: 59 | Source: Ambulatory Visit | Attending: Cardiology | Admitting: Cardiology

## 2019-11-13 DIAGNOSIS — Z952 Presence of prosthetic heart valve: Secondary | ICD-10-CM | POA: Diagnosis not present

## 2019-11-16 ENCOUNTER — Encounter (HOSPITAL_COMMUNITY)
Admission: RE | Admit: 2019-11-16 | Discharge: 2019-11-16 | Disposition: A | Discharge status: Home / Self Care | Payer: 59 | Source: Ambulatory Visit | Attending: Cardiology | Admitting: Cardiology

## 2019-11-16 ENCOUNTER — Telehealth: Payer: Self-pay | Admitting: Cardiology

## 2019-11-16 ENCOUNTER — Other Ambulatory Visit: Payer: Self-pay

## 2019-11-16 DIAGNOSIS — Z952 Presence of prosthetic heart valve: Secondary | ICD-10-CM | POA: Diagnosis not present

## 2019-11-16 NOTE — Progress Notes (Signed)
Steve Richards's weight is 107.2 kg up 2.5 kg from 11/13/19, this past Friday. Patient denies shortness of breath.  Upon assessment lung fields clear upon ascultation. Oxygen saturations 96% on room air. Patient said that he drank a lot of water today and ate pizza for dinner. Dr Kingsley Plan office called and notified about weight gain.Will fax exercise flow sheets to Dr. Kingsley Plan  office for review with exercise flow sheets from cardiac rehab. Patient exercised without difficulty or complaints today.Barnet Pall, RN,BSN 11/16/2019 4:42 PM

## 2019-11-16 NOTE — Telephone Encounter (Signed)
°  She will be sending a message over about the patient's weight gain to Dr Marlou Porch and Jeannene Patella F by fax and will put a note in Epic

## 2019-11-18 ENCOUNTER — Other Ambulatory Visit: Payer: Self-pay

## 2019-11-18 ENCOUNTER — Encounter (HOSPITAL_COMMUNITY)
Admission: RE | Admit: 2019-11-18 | Discharge: 2019-11-18 | Disposition: A | Discharge status: Home / Self Care | Payer: 59 | Source: Ambulatory Visit | Attending: Cardiology | Admitting: Cardiology

## 2019-11-18 DIAGNOSIS — Z952 Presence of prosthetic heart valve: Secondary | ICD-10-CM

## 2019-11-19 NOTE — Telephone Encounter (Signed)
Called and left message for pt regarding f/u r/t report of wt gain per Verdis Frederickson at [facility].  Requested he c/b if he is having any problems/concerns r/t this wt increase.  Advised he does not it to c/b unless he is having a problem.

## 2019-11-19 NOTE — Telephone Encounter (Signed)
Received faxed information from Cardiac Rehab.  Documentation reports wt increase 2.5 kg from Friday, clear lungs, 02 sat 96% - pt said he drank a lot of water and ate a pizza the day before.  Will have Dr Marlou Porch to review when in office next.  Pt has seen been back to cardiac rehab with no further correspondence r/t wt.

## 2019-11-20 ENCOUNTER — Encounter (HOSPITAL_COMMUNITY)
Admission: RE | Admit: 2019-11-20 | Discharge: 2019-11-20 | Disposition: A | Discharge status: Home / Self Care | Payer: 59 | Source: Ambulatory Visit | Attending: Cardiology | Admitting: Cardiology

## 2019-11-20 ENCOUNTER — Other Ambulatory Visit: Payer: Self-pay

## 2019-11-20 DIAGNOSIS — Z952 Presence of prosthetic heart valve: Secondary | ICD-10-CM | POA: Diagnosis not present

## 2019-11-20 NOTE — Telephone Encounter (Signed)
Pt has not called back.  Will close this encounter for now and await a return call if pt is having any further concerns/issues.

## 2019-11-23 ENCOUNTER — Encounter (HOSPITAL_COMMUNITY)
Admission: RE | Admit: 2019-11-23 | Discharge: 2019-11-23 | Disposition: A | Discharge status: Home / Self Care | Payer: 59 | Source: Ambulatory Visit | Attending: Cardiology | Admitting: Cardiology

## 2019-11-23 ENCOUNTER — Other Ambulatory Visit: Payer: Self-pay

## 2019-11-23 VITALS — Ht 70.75 in | Wt 233.7 lb

## 2019-11-23 DIAGNOSIS — Z952 Presence of prosthetic heart valve: Secondary | ICD-10-CM

## 2019-11-25 ENCOUNTER — Other Ambulatory Visit: Payer: Self-pay

## 2019-11-25 ENCOUNTER — Encounter (HOSPITAL_COMMUNITY)
Admission: RE | Admit: 2019-11-25 | Discharge: 2019-11-25 | Disposition: A | Discharge status: Home / Self Care | Payer: 59 | Source: Ambulatory Visit | Attending: Cardiology | Admitting: Cardiology

## 2019-11-25 DIAGNOSIS — Z952 Presence of prosthetic heart valve: Secondary | ICD-10-CM | POA: Diagnosis not present

## 2019-11-27 ENCOUNTER — Other Ambulatory Visit: Payer: Self-pay

## 2019-11-27 ENCOUNTER — Encounter (HOSPITAL_COMMUNITY)
Admission: RE | Admit: 2019-11-27 | Discharge: 2019-11-27 | Disposition: A | Discharge status: Home / Self Care | Payer: 59 | Source: Ambulatory Visit | Attending: Cardiology | Admitting: Cardiology

## 2019-11-27 DIAGNOSIS — Z952 Presence of prosthetic heart valve: Secondary | ICD-10-CM | POA: Diagnosis not present

## 2019-11-27 NOTE — Progress Notes (Signed)
Discharge Progress Report  Patient Details  Name: Steve Richards MRN: [id number] Date of Birth: 1965-07-06 Referring Provider:     [place] from 10/01/2019 in [place]  Referring Provider Jerline Pain MD       Number of Visits: 20  Reason for Discharge:  Patient reached a stable level of exercise. Patient independent in their exercise. Patient has met program and personal goals.  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, Redo Sternotomy for bleeding  ADL UCSD:   Initial Exercise Prescription:  Initial Exercise Prescription - 10/01/19 1600      Date of Initial Exercise RX and Referring Provider   Date 10/01/19    Referring Provider Jerline Pain MD    Expected Discharge Date 11/27/19      Recumbant Bike   Level 2    Watts 25    Minutes 15    METs 2.5      NuStep   Level 2    SPM 75    Minutes 15    METs 2.5      Prescription Details   Frequency (times per week) 3x    Duration Progress to 30 minutes of continuous aerobic without signs/symptoms of physical distress      Intensity   THRR 40-80% of Max Heartrate 66-133    Ratings of Perceived Exertion 11-13    Perceived Dyspnea 0-4      Progression   Progression Continue progressive overload as per policy without signs/symptoms or physical distress.      Resistance Training   Training Prescription Yes    Weight 3lbs    Reps 10-15           Discharge Exercise Prescription (Final Exercise Prescription Changes):  Exercise Prescription Changes - 11/27/19 1620      Response to Exercise   Blood Pressure (Admit) 140/76    Blood Pressure (Exercise) 124/72    Blood Pressure (Exit) 110/72    Heart Rate (Admit) 90 bpm    Heart Rate (Exercise) 135 bpm    Heart Rate (Exit) 86 bpm    Rating of Perceived Exertion (Exercise) 12    Symptoms None     Comments Pt graduated from the [place] program today.    Duration Continue with 30 min of aerobic exercise without signs/symptoms of physical distress.    Intensity THRR unchanged      Progression   Average METs 5.4      Resistance Training   Training Prescription Yes    Weight 6 lbs    Reps 10-15    Time 10 Minutes      Interval Training   Interval Training No      Bike   Level 6    Minutes 15    METs 7.5      NuStep   Level 6    SPM 105    Minutes 15    METs 3.2      Home Exercise Plan   Plans to continue exercise at Home (comment)    Frequency Add 3 additional days to program exercise sessions.    Initial Home Exercises Provided 10/19/19           Functional Capacity:  6 Minute Walk    Row Name 10/01/19 1535 11/20/19 1517       6 Minute Walk  Phase Initial Discharge    Distance 1559 feet 1676 feet    Distance % Change -- 7.5 %    Distance Feet Change -- 117 ft    Walk Time 6 minutes 6 minutes    # of Rest Breaks 0 0    MPH 2.95 3.17    METS 3.88 4.2    RPE 12 9    Perceived Dyspnea  0 0    VO2 Peak 13.59 14.72    Symptoms No No    Resting HR 88 bpm 93 bpm    Resting BP 104/68 104/66    Resting Oxygen Saturation  98 % 97 %    Exercise Oxygen Saturation  during 6 min walk 96 % 97 %    Max Ex. HR 106 bpm 107 bpm    Max Ex. BP 110/70 130/70    2 Minute Post BP 104/66 --           Psychological, QOL, Others - Outcomes: PHQ 2/9: Depression screen [facility] 2/9 11/27/2019 10/01/2019  Decreased Interest 0 0  Down, Depressed, Hopeless 0 0  PHQ - 2 Score 0 0    Quality of Life:  Quality of Life - 11/23/19 1630      Quality of Life Scores   Health/Function Post 26.67 %    Socioeconomic Post 27.64 %    Psych/Spiritual Post 26.67 %    Family Post 30 %    GLOBAL Post 27.21 %           Personal Goals: Goals established at orientation with interventions provided to work toward goal.  Personal Goals and Risk Factors at Admission - 10/02/19 0837       Core Components/Risk Factors/Patient Goals on Admission    Weight Management Yes;Obesity;Weight Loss;Weight Maintenance    Intervention Weight Management: Develop a combined nutrition and exercise program designed to reach desired caloric intake, while maintaining appropriate intake of nutrient and fiber, sodium and fats, and appropriate energy expenditure required for the weight goal.;Weight Management: Provide education and appropriate resources to help participant work on and attain dietary goals.;Weight Management/Obesity: Establish reasonable short term and long term weight goals.;Obesity: Provide education and appropriate resources to help participant work on and attain dietary goals.    Diabetes Yes    Intervention Provide education about signs/symptoms and action to take for hypo/hyperglycemia.;Provide education about proper nutrition, including hydration, and aerobic/resistive exercise prescription along with prescribed medications to achieve blood glucose in normal ranges: Fasting glucose 65-99 mg/dL    Expected Outcomes Short Term: Participant verbalizes understanding of the signs/symptoms and immediate care of hyper/hypoglycemia, proper foot care and importance of medication, aerobic/resistive exercise and nutrition plan for blood glucose control.;Long Term: Attainment of HbA1C < 7%.    Hypertension Yes    Intervention Provide education on lifestyle modifcations including regular physical activity/exercise, weight management, moderate sodium restriction and increased consumption of fresh fruit, vegetables, and low fat dairy, alcohol moderation, and smoking cessation.;Monitor prescription use compliance.    Expected Outcomes Short Term: Continued assessment and intervention until BP is < 140/54m HG in hypertensive participants. < 130/840mHG in hypertensive participants with diabetes, heart failure or chronic kidney disease.;Long Term: Maintenance of blood pressure at goal levels.             Personal Goals Discharge:  Goals and Risk Factor Review    Row Name 10/15/19 1216 11/12/19 1600 12/15/19 0922         Core Components/Risk Factors/Patient Goals Review   Personal  Goals Review Weight Management/Obesity;Diabetes;Hypertension Weight Management/Obesity;Diabetes;Hypertension Weight Management/Obesity;Diabetes;Hypertension     Review Steve Richards's vital signs and CBG's have been stable. Steve Richards is doing well with exercise. Steve Richards's vital signs and CBG's have been stable. Steve Richards is doing well with exercise. Steve Richards completed exercise at cardiac rehab on 12/15/19.     Expected Outcomes Steve Richards will continue to participate in phase 2 cardiac rehab for exercise, nutrition and lifestyle modifications Steve Richards will continue to participate in phase 2 cardiac rehab for exercise, nutrition and lifestyle modifications Steve Richards was encouraged to continue exercise. Follow nutrtion and lifestyle modifications upon completion of phase 2 cardiac rehab.            Exercise Goals and Review:  Exercise Goals    Row Name 10/01/19 1541             Exercise Goals   Increase Physical Activity Yes       Intervention Provide advice, education, support and counseling about physical activity/exercise needs.;Develop an individualized exercise prescription for aerobic and resistive training based on initial evaluation findings, risk stratification, comorbidities and participant's personal goals.       Expected Outcomes Long Term: Add in home exercise to make exercise part of routine and to increase amount of physical activity.;Short Term: Attend rehab on a regular basis to increase amount of physical activity.;Long Term: Exercising regularly at least 3-5 days a week.       Increase Strength and Stamina Yes       Intervention Provide advice, education, support and counseling about physical activity/exercise needs.;Develop an individualized exercise prescription for aerobic and resistive training based on initial evaluation findings, risk  stratification, comorbidities and participant's personal goals.       Expected Outcomes Short Term: Increase workloads from initial exercise prescription for resistance, speed, and METs.;Short Term: Perform resistance training exercises routinely during rehab and add in resistance training at home;Long Term: Improve cardiorespiratory fitness, muscular endurance and strength as measured by increased METs and functional capacity (6MWT)       Able to understand and use rate of perceived exertion (RPE) scale Yes       Intervention Provide education and explanation on how to use RPE scale       Expected Outcomes Short Term: Able to use RPE daily in rehab to express subjective intensity level;Long Term:  Able to use RPE to guide intensity level when exercising independently       Knowledge and understanding of Target Heart Rate Range (THRR) Yes       Intervention Provide education and explanation of THRR including how the numbers were predicted and where they are located for reference       Expected Outcomes Short Term: Able to state/look up THRR;Long Term: Able to use THRR to govern intensity when exercising independently;Short Term: Able to use daily as guideline for intensity in rehab       Able to check pulse independently Yes       Intervention Provide education and demonstration on how to check pulse in carotid and radial arteries.;Review the importance of being able to check your own pulse for safety during independent exercise       Expected Outcomes Short Term: Able to explain why pulse checking is important during independent exercise;Long Term: Able to check pulse independently and accurately       Understanding of Exercise Prescription Yes       Intervention Provide education, explanation, and written materials on patient's individual exercise prescription  Expected Outcomes Short Term: Able to explain program exercise prescription;Long Term: Able to explain home exercise prescription to  exercise independently              Exercise Goals Re-Evaluation:  Exercise Goals Re-Evaluation    Row Name 10/05/19 1645 10/19/19 1630 11/04/19 1640 11/27/19 1640       Exercise Goal Re-Evaluation   Exercise Goals Review Increase Physical Activity;Increase Strength and Stamina;Able to understand and use rate of perceived exertion (RPE) scale;Knowledge and understanding of Target Heart Rate Range (THRR);Understanding of Exercise Prescription Increase Physical Activity;Increase Strength and Stamina;Able to understand and use rate of perceived exertion (RPE) scale;Knowledge and understanding of Target Heart Rate Range (THRR);Able to check pulse independently;Understanding of Exercise Prescription Increase Strength and Stamina;Increase Physical Activity;Able to understand and use rate of perceived exertion (RPE) scale;Knowledge and understanding of Target Heart Rate Range (THRR);Able to check pulse independently;Understanding of Exercise Prescription Increase Physical Activity;Increase Strength and Stamina;Able to understand and use rate of perceived exertion (RPE) scale;Knowledge and understanding of Target Heart Rate Range (THRR);Able to check pulse independently;Understanding of Exercise Prescription    Comments Pt's first day of exercise in the [facility] program. Pt tolerated initial exercsie session well. Pt understands exercise RX, THRR, and RPE scale, Due to time patient wasunable to do free weights today. Reviewed METs and Home Exercise Rx with patient today. Pt is walking at home currently 3-5x/week. Pt veralized understanding of the home exercise Rx and was provided copy. Pt is making good progress in the [facility] program. Pt has good attendance. Pt continues to walk at home 3-5x/wk. Pt graduated for the [facility] program, Pt progressed well though program and had an average MET level of 5.4. Pt will continue to exercise at home by walking 5-7x/week for 30-45 minutes.    Expected Outcomes Will continue to  monitor patients and progress workloads as tolerated. Pt will walk at home 3-4x/week in addtion to his [facility] program and abide by the Rx guidelines. Pt will continue to progress as tolerated in the [facility] program and walk at home. Pt will continue to exercise at home on his own.           Nutrition & Weight - Outcomes:  Pre Biometrics - 10/01/19 1538      Pre Biometrics   Height 5' 10.75" (1.797 m)    Weight 103.6 kg    Waist Circumference 44.5 inches    Hip Circumference 47 inches    Waist to Hip Ratio 0.95 %    BMI (Calculated) 32.08    Triceps Skinfold 8 mm    % Body Fat 28.1 %    Grip Strength 32 kg    Flexibility 12.75 in    Single Leg Stand 30 seconds           Post Biometrics - 11/23/19 1639       Post  Biometrics   Height 5' 10.75" (1.797 m)    Weight 106 kg    Waist Circumference 44.5 inches    Hip Circumference 47 inches    Waist to Hip Ratio 0.95 %    BMI (Calculated) 32.83    Triceps Skinfold 9 mm    % Body Fat 28.2 %    Grip Strength 39 kg    Flexibility 13 in    Single Leg Stand 28.56 seconds           Nutrition:  Nutrition Therapy & Goals - 11/12/19 0708      Nutrition Therapy  Diet Heart Healthy      Personal Nutrition Goals   Nutrition Goal Pt to build a healthy plate including vegetables, fruits, whole grains, and low-fat dairy products in a heart healthy meal plan.    Personal Goal #2 Pt able to name foods that affect blood glucose      Intervention Plan   Intervention Prescribe, educate and counsel regarding individualized specific dietary modifications aiming towards targeted core components such as weight, hypertension, lipid management, diabetes, heart failure and other comorbidities.    Expected Outcomes Short Term Goal: Understand basic principles of dietary content, such as calories, fat, sodium, cholesterol and nutrients.           Nutrition Discharge:  Nutrition Assessments - 12/01/19 1451      MEDFICTS Scores   Post Score 68            Education Questionnaire Score:  Knowledge Questionnaire Score - 11/23/19 1630      Knowledge Questionnaire Score   Post Score 24/24           Goals reviewed with patient; copy given to patient.Pt graduated from cardiac rehab program on 11/27/19 with completion of 20 exercise sessions in Phase II. Pt maintained good attendance and progressed nicely during his participation in rehab as evidenced by increased MET level.   Medication list reconciled. Repeat  PHQ score-  0.  Pt has made significant lifestyle changes and should be commended for his success. Pt feels he has achieved his goals during cardiac rehab. Steve Richards increased his distance on his post exercise walk test by 117 feet.   Pt plans to continue exercise by walking. We are proud of Steve Richards's progress. Barnet Pall, RN,BSN 12/15/2019 9:28 AM

## 2019-12-04 ENCOUNTER — Ambulatory Visit: Payer: 59

## 2019-12-04 ENCOUNTER — Other Ambulatory Visit: Payer: Self-pay

## 2019-12-04 DIAGNOSIS — R739 Hyperglycemia, unspecified: Secondary | ICD-10-CM

## 2019-12-04 DIAGNOSIS — E039 Hypothyroidism, unspecified: Secondary | ICD-10-CM

## 2019-12-05 LAB — COMPREHENSIVE METABOLIC PANEL
ALT: 20 IU/L (ref 0–44)
AST: 26 IU/L (ref 0–40)
Albumin/Globulin Ratio: 1.7 (ref 1.2–2.2)
Albumin: 4.7 g/dL (ref 3.8–4.9)
Alkaline Phosphatase: 67 IU/L (ref 44–121)
BUN/Creatinine Ratio: 15 (ref 9–20)
BUN: 14 mg/dL (ref 6–24)
Bilirubin Total: 1.1 mg/dL (ref 0.0–1.2)
CO2: 23 mmol/L (ref 20–29)
Calcium: 9.5 mg/dL (ref 8.7–10.2)
Chloride: 101 mmol/L (ref 96–106)
Creatinine, Ser: 0.93 mg/dL (ref 0.76–1.27)
GFR calc Af Amer: 107 mL/min/{1.73_m2} (ref >59)
GFR calc non Af Amer: 93 mL/min/{1.73_m2} (ref >59)
Globulin, Total: 2.7 g/dL (ref 1.5–4.5)
Glucose: 81 mg/dL (ref 65–99)
Potassium: 4.4 mmol/L (ref 3.5–5.2)
Sodium: 139 mmol/L (ref 134–144)
Total Protein: 7.4 g/dL (ref 6.0–8.5)

## 2019-12-05 LAB — TSH: TSH: 8.98 u[IU]/mL — ABNORMAL HIGH (ref 0.450–4.500)

## 2019-12-05 LAB — HEMOGLOBIN A1C
Est. average glucose Bld gHb Est-mCnc: 117 mg/dL
Hgb A1c MFr Bld: 5.7 % — ABNORMAL HIGH (ref 4.8–5.6)

## 2019-12-10 ENCOUNTER — Other Ambulatory Visit: Payer: Self-pay

## 2019-12-10 ENCOUNTER — Encounter: Payer: Self-pay | Admitting: Family Medicine

## 2019-12-10 ENCOUNTER — Ambulatory Visit: Payer: 59 | Admitting: Family Medicine

## 2019-12-10 VITALS — BP 103/72 | HR 76 | Temp 98.3°F | Ht 70.75 in | Wt 229.6 lb

## 2019-12-10 DIAGNOSIS — R739 Hyperglycemia, unspecified: Secondary | ICD-10-CM

## 2019-12-10 DIAGNOSIS — E039 Hypothyroidism, unspecified: Secondary | ICD-10-CM | POA: Diagnosis not present

## 2019-12-10 DIAGNOSIS — Z953 Presence of xenogenic heart valve: Secondary | ICD-10-CM | POA: Diagnosis not present

## 2019-12-10 MED ORDER — LEVOTHYROXINE SODIUM 50 MCG PO TABS: 50.0000 ug | ORAL_TABLET | Freq: Every day | ORAL | 1 refills | Status: DC

## 2019-12-10 MED ORDER — METFORMIN HCL 500 MG PO TABS: 500.0000 mg | ORAL_TABLET | Freq: Every day | ORAL | 1 refills | Status: DC

## 2019-12-10 NOTE — Progress Notes (Signed)
10/7/20212:41 PM  Steve Richards Mar 04, 1966, 54 y.o., male [id number]  Chief Complaint  Patient presents with  . Follow-up    heart valve replacement, needing note for dentist to clear he is ok to have dental work done. Also needing note for ok for massage therapy    HPI:   Patient is a 54 y.o. male with past medical history significant for AS s/p AVR, combined CHF, liver cirrhosis, hypothyroidism, prediabetes for routine followup  Last OV July 2021 - no changes  He will be having routine dental cleaning next week He does have amoxicillin on hand to use prior to dental procedures He had completed cardiac rehab He is working toward running a 5K He would like a letter stating that he can get a massage He has been experiencing ED since heart surgery, not interested in meds just wondering about expectations  Lab Results  Component Value Date   TSH 8.980 (H) 12/04/2019   Lab Results  Component Value Date   CREATININE 0.93 12/04/2019   BUN 14 12/04/2019   NA 139 12/04/2019   K 4.4 12/04/2019   CL 101 12/04/2019   CO2 23 12/04/2019   Lab Results  Component Value Date   ALT 20 12/04/2019   AST 26 12/04/2019   ALKPHOS 67 12/04/2019   BILITOT 1.1 12/04/2019   Lab Results  Component Value Date   HGBA1C 5.7 (H) 12/04/2019    Depression screen PHQ 2/9 11/27/2019 10/01/2019  Decreased Interest 0 0  Down, Depressed, Hopeless 0 0  PHQ - 2 Score 0 0    Fall Risk  12/10/2019 09/10/2019  Falls in the past year? - 0  Number falls in past yr: 0 0  Injury with Fall? 0 0     No Known Allergies  Prior to Admission medications   Medication Sig Start Date End Date Taking? Authorizing Provider  acetaminophen (TYLENOL) 325 MG tablet Take 2 tablets (650 mg total) by mouth every 6 (six) hours as needed for mild pain. 08/17/19  Yes Harriet Pho, Tessa N, PA-C  amoxicillin (AMOXIL) 500 MG capsule Take 4 capsules by mouth 30-60 minutes before dental procedures 08/25/19  Yes Richardson Dopp T, PA-C    aspirin EC 325 MG EC tablet Take 1 tablet (325 mg total) by mouth daily. 08/18/19  Yes Harriet Pho, Tessa N, PA-C  carvedilol (COREG) 3.125 MG tablet Take 1 tablet (3.125 mg total) by mouth 2 (two) times daily with a meal. 10/09/19  Yes Jerline Pain, MD  furosemide (LASIX) 40 MG tablet Take 1 tablet (40 mg total) by mouth daily. 10/09/19  Yes Jerline Pain, MD  levothyroxine (SYNTHROID) 25 MCG tablet Take 1 tablet (25 mcg total) by mouth daily at 6 (six) AM. 09/10/19  Yes Rutherford Guys, MD  metFORMIN (GLUCOPHAGE) 500 MG tablet Take 1 tablet (500 mg total) by mouth 2 (two) times daily with a meal. 09/10/19  Yes Rutherford Guys, MD  Multiple Vitamin (MULTIVITAMIN) tablet Take 1 tablet by mouth daily.   Yes [provider]    Past Medical History:  Diagnosis Date  . Ascites 07/15/2019  . Cellulitis and abscess of left lower extremity 07/21/2019  . Chronic combined systolic (congestive) and diastolic (congestive) heart failure ([place])   . Cirrhosis of liver with ascites ([place])   . Critical aortic valve stenosis   . Hepatic cirrhosis ([place]) 07/15/2019  . Hyperglycemia 07/21/2019  . Left kidney mass 07/15/2019  . Morbid obesity ([place])   . Pleural effusion   .  Pleural effusion on right 07/15/2019  . S/P aortic valve replacement with bioprosthetic valve 08/06/2019   Size 25 mm // Echocardiogram 8/21: EF 55-60, mod LVH, Gr 2 DD, normal RVSF, mod LAE, trivial MR, AVR w mean 13 mmHg and no PVL  . Severe aortic stenosis   . Splenomegaly 07/15/2019  . Systolic ejection murmur 7/67/3419    Past Surgical History:  Procedure Laterality Date  . AORTIC VALVE REPLACEMENT N/A 08/06/2019   Procedure: AORTIC VALVE REPLACEMENT (AVR) using Margaretha Sheffield Resilia 25 MM Aortic Valve.;  Surgeon: Gaye Pollack, MD;  [facility];  Service: Open Heart Surgery;  Laterality: N/A;  . CARDIAC CATHETERIZATION    . EXPLORATION POST OPERATIVE OPEN HEART N/A 08/06/2019   Procedure: EXPLORATION POST OPERATIVE OPEN HEART;   Surgeon: Gaye Pollack, MD;  Location: [place];  Service: Open Heart Surgery;  Laterality: N/A;  . IR THORACENTESIS ASP PLEURAL SPACE W/IMG GUIDE  06/25/2019  . RIGHT/LEFT HEART CATH AND CORONARY ANGIOGRAPHY N/A 07/28/2019   Procedure: RIGHT/LEFT HEART CATH AND CORONARY ANGIOGRAPHY;  Surgeon: Sherren Mocha, MD;  Location: [facility];  Service: Cardiovascular;  Laterality: N/A;  . TEE WITHOUT CARDIOVERSION N/A 08/06/2019   Procedure: TRANSESOPHAGEAL ECHOCARDIOGRAM (TEE);  Surgeon: Gaye Pollack, MD;  Location: [facility];  Service: Open Heart Surgery;  Laterality: N/A;    Social History   Tobacco Use  . Smoking status: Never Smoker  . Smokeless tobacco: Never Used  Substance Use Topics  . Alcohol use: Yes    Comment: rarely    Family History  Problem Relation Age of Onset  . Osteoporosis Mother   . Other Father        died in a hurricane    Review of Systems  Constitutional: Negative for chills and fever.  Respiratory: Negative for cough and shortness of breath.   Cardiovascular: Negative for chest pain, palpitations and leg swelling.  Gastrointestinal: Negative for abdominal pain, nausea and vomiting.   Per hpi  OBJECTIVE:  Today's Vitals   12/10/19 1422  BP: 103/72  Pulse: 76  Temp: 98.3 F (36.8 C)  SpO2: 100%  Weight: 229 lb 9.6 oz (104.1 kg)  Height: 5' 10.75" (1.797 m)   Body mass index is 32.25 kg/m.  Wt Readings from Last 3 Encounters:  12/10/19 229 lb 9.6 oz (104.1 kg)  11/23/19 233 lb 11 oz (106 kg)  10/09/19 232 lb 9.6 oz (105.5 kg)    Physical Exam Vitals and nursing note reviewed.  Constitutional:      Appearance: He is well-developed.  HENT:     Head: Normocephalic and atraumatic.  Eyes:     Extraocular Movements: Extraocular movements intact.     Conjunctiva/sclera: Conjunctivae normal.     Pupils: Pupils are equal, round, and reactive to light.  Cardiovascular:     Rate and Rhythm: Normal rate and regular rhythm.     Heart sounds: No  murmur heard.  No friction rub. No gallop.   Pulmonary:     Effort: Pulmonary effort is normal.     Breath sounds: Normal breath sounds. No wheezing, rhonchi or rales.  Musculoskeletal:     Cervical back: Neck supple.     Right lower leg: No edema.     Left lower leg: No edema.  Skin:    General: Skin is warm and dry.  Neurological:     Mental Status: He is alert and oriented to person, place, and time.     No results  found for this or any previous visit (from the past 24 hour(s)).  No results found.   ASSESSMENT and PLAN  1. Hypothyroidism, unspecified type Not at goal. Increase levothyroxine to 51mg daily. Recheck labs 2-3 days prior to next OV  2. Hyperglycemia Improved A1c, weight loss and increased physical activity, decrease metformin to once a day. Consider d/c if able to control with LFM  3. S/P aortic valve replacement with bioprosthetic valve Has been released from CT surg. Has completed cardiac rehab Ok to have routine dental cleaning, prophylaxis with amoxacillin for procedures Ok to have routine massage Advise he discuss with cards re ED after heart surgery and expectations  Other orders - levothyroxine (SYNTHROID) 50 MCG tablet; Take 1 tablet (50 mcg total) by mouth daily before breakfast. - metFORMIN (GLUCOPHAGE) 500 MG tablet; Take 1 tablet (500 mg total) by mouth daily with breakfast.  Return in about 3 months (around 03/11/2020) for hypothyroidism.    IRutherford Guys MD [facility] [place] [id number].  [phone] [phone]

## 2019-12-10 NOTE — Patient Instructions (Signed)
° ° ° °  If you have lab work done today you will be contacted with your lab results within the next 2 weeks.  If you have not heard from us then please contact us. The fastest way to get your results is to register for My Chart. ° ° °IF you received an x-ray today, you will receive an invoice from [facility]. Please contact [facility] at [phone] with questions or concerns regarding your invoice.  ° °IF you received labwork today, you will receive an invoice from [facility]. Please contact [facility] at [phone] with questions or concerns regarding your invoice.  ° °Our billing staff will not be able to assist you with questions regarding bills from these companies. ° °You will be contacted with the lab results as soon as they are available. The fastest way to get your results is to activate your My Chart account. Instructions are located on the last page of this paperwork. If you have not heard from us regarding the results in 2 weeks, please contact this office. °  ° ° ° °

## 2020-01-12 ENCOUNTER — Other Ambulatory Visit: Payer: Self-pay

## 2020-01-12 ENCOUNTER — Encounter: Payer: Self-pay | Admitting: Cardiology

## 2020-01-12 ENCOUNTER — Ambulatory Visit: Payer: 59 | Admitting: Cardiology

## 2020-01-12 VITALS — BP 110/80 | HR 73 | Ht 72.0 in | Wt 237.4 lb

## 2020-01-12 DIAGNOSIS — Z952 Presence of prosthetic heart valve: Secondary | ICD-10-CM

## 2020-01-12 MED ORDER — ASPIRIN EC 81 MG PO TBEC
81.0000 mg | DELAYED_RELEASE_TABLET | Freq: Every day | ORAL | 11 refills | Status: DC
Start: 2020-01-12 — End: 2022-02-23

## 2020-01-12 MED ORDER — FUROSEMIDE 40 MG PO TABS: 40.0000 mg | ORAL_TABLET | ORAL | 6 refills | Status: DC | PRN

## 2020-01-12 NOTE — Patient Instructions (Signed)
Medication Instructions:  Please stop your Carvedilol. Decrease Aspirin to 81 mg a day. You may take Furosemide on an as needed basis. Continue all other medications as listed.  *If you need a refill on your cardiac medications before your next appointment, please call your pharmacy*  Follow-Up: At [facility], you and your health needs are our priority.  As part of our continuing mission to provide you with exceptional heart care, we have created designated Provider Care Teams.  These Care Teams include your primary Cardiologist (physician) and Advanced Practice Providers (APPs -  Physician Assistants and Nurse Practitioners) who all work together to provide you with the care you need, when you need it.  We recommend signing up for the patient portal called "MyChart".  Sign up information is provided on this After Visit Summary.  MyChart is used to connect with patients for Virtual Visits (Telemedicine).  Patients are able to view lab/test results, encounter notes, upcoming appointments, etc.  Non-urgent messages can be sent to your provider as well.   To learn more about what you can do with MyChart, go to [email or web address].    Your next appointment:   6 month(s)  The format for your next appointment:   In Person  Provider:   Candee Furbish, MD   Thank you for choosing [facility]!!

## 2020-01-12 NOTE — Progress Notes (Signed)
Cardiology Office Note:    Date:  01/12/2020   ID:  Steve Richards, DOB [birth year], MRN [id number]  PCP:  Horald Pollen, MD  [facility] Cardiologist:  Candee Furbish, MD  [facility] Electrophysiologist:  None   Referring MD: Rutherford Guys, MD     History of Present Illness:    Steve Richards is a 54 y.o. male here for the follow-up of aortic valve replacement 25 mm pericardial tissue valve on 08/06/2019.  He presented with stage D critical symptomatic aortic stenosis with class IV symptoms.  Has done very well postoperatively.  No fevers chills nausea vomiting.  Taking his medications.  Edema has much improved.  Walking more, jogging    Past Medical History:  Diagnosis Date  . Ascites 07/15/2019  . Cellulitis and abscess of left lower extremity 07/21/2019  . Chronic combined systolic (congestive) and diastolic (congestive) heart failure ([place])   . Cirrhosis of liver with ascites ([place])   . Critical aortic valve stenosis   . Hepatic cirrhosis ([place]) 07/15/2019  . Hyperglycemia 07/21/2019  . Left kidney mass 07/15/2019  . Morbid obesity ([place])   . Pleural effusion   . Pleural effusion on right 07/15/2019  . S/P aortic valve replacement with bioprosthetic valve 08/06/2019   Size 25 mm // Echocardiogram 8/21: EF 55-60, mod LVH, Gr 2 DD, normal RVSF, mod LAE, trivial MR, AVR w mean 13 mmHg and no PVL  . Severe aortic stenosis   . Splenomegaly 07/15/2019  . Systolic ejection murmur 09/04/4033    Past Surgical History:  Procedure Laterality Date  . AORTIC VALVE REPLACEMENT N/A 08/06/2019   Procedure: AORTIC VALVE REPLACEMENT (AVR) using Margaretha Sheffield Resilia 25 MM Aortic Valve.;  Surgeon: Gaye Pollack, MD;  [facility];  Service: Open Heart Surgery;  Laterality: N/A;  . CARDIAC CATHETERIZATION    . EXPLORATION POST OPERATIVE OPEN HEART N/A 08/06/2019   Procedure: EXPLORATION POST OPERATIVE OPEN HEART;  Surgeon: Gaye Pollack, MD;  Location: [place];  Service: Open  Heart Surgery;  Laterality: N/A;  . IR THORACENTESIS ASP PLEURAL SPACE W/IMG GUIDE  06/25/2019  . RIGHT/LEFT HEART CATH AND CORONARY ANGIOGRAPHY N/A 07/28/2019   Procedure: RIGHT/LEFT HEART CATH AND CORONARY ANGIOGRAPHY;  Surgeon: Sherren Mocha, MD;  Location: [facility] [facility];  Service: Cardiovascular;  Laterality: N/A;  . TEE WITHOUT CARDIOVERSION N/A 08/06/2019   Procedure: TRANSESOPHAGEAL ECHOCARDIOGRAM (TEE);  Surgeon: Gaye Pollack, MD;  Location: [place];  Service: Open Heart Surgery;  Laterality: N/A;    Current Medications: Current Meds  Medication Sig  . acetaminophen (TYLENOL) 325 MG tablet Take 2 tablets (650 mg total) by mouth every 6 (six) hours as needed for mild pain.  Marland Kitchen amoxicillin (AMOXIL) 500 MG capsule Take 4 capsules by mouth 30-60 minutes before dental procedures  . furosemide (LASIX) 40 MG tablet Take 1 tablet (40 mg total) by mouth as needed.  Marland Kitchen levothyroxine (SYNTHROID) 50 MCG tablet Take 1 tablet (50 mcg total) by mouth daily before breakfast.  . metFORMIN (GLUCOPHAGE) 500 MG tablet Take 1 tablet (500 mg total) by mouth daily with breakfast.  . Multiple Vitamin (MULTIVITAMIN) tablet Take 1 tablet by mouth daily.  . [DISCONTINUED] aspirin EC 325 MG EC tablet Take 1 tablet (325 mg total) by mouth daily.  . [DISCONTINUED] carvedilol (COREG) 3.125 MG tablet Take 1 tablet (3.125 mg total) by mouth 2 (two) times daily with a meal.  . [DISCONTINUED] furosemide (LASIX) 40 MG tablet Take 1 tablet (40  mg total) by mouth daily.     Allergies:   Patient has no known allergies.   Social History   Socioeconomic History  . Marital status: Single    Spouse name: Not on file  . Number of children: Not on file  . Years of education: 24  . Highest education level: Bachelor's degree (e.g., BA, AB, BS)  Occupational History  . 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 Smoking status: Never Smoker  . Smokeless tobacco: Never Used  Substance and Sexual  Activity  . Alcohol use: Yes    Comment: rarely  . Drug use: Never  . Sexual activity: Not on file  Other Topics Concern  . Not on file  Social History Narrative  . Not on file   Social Determinants of Health   Financial Resource Strain:   . Difficulty of Paying Living Expenses: Not on file  Food Insecurity:   . Worried About Charity fundraiser in the Last Year: Not on file  . Ran Out of Food in the Last Year: Not on file  Transportation Needs:   . Lack of Transportation (Medical): Not on file  . Lack of Transportation (Non-Medical): Not on file  Physical Activity:   . Days of Exercise per Week: Not on file  . Minutes of Exercise per Session: Not on file  Stress:   . Feeling of Stress : Not on file  Social Connections:   . Frequency of Communication with Friends and Family: Not on file  . Frequency of Social Gatherings with Friends and Family: Not on file  . Attends Religious Services: Not on file  . Active Member of Clubs or Organizations: Not on file  . Attends Archivist Meetings: Not on file  . Marital Status: Not on file     Family History: The patient's family history includes Osteoporosis in his mother; Other in his father.  ROS:   Please see the history of present illness.    No bleeding no syncope all other systems reviewed and are negative.  EKGs/Labs/Other Studies Reviewed:    The following studies were reviewed today:  ECHO 10/20/19:  1. Left ventricular ejection fraction, by estimation, is 55 to 60%. The  left ventricle has normal function. The left ventricle has no regional  wall motion abnormalities. There is moderate left ventricular hypertrophy.  Left ventricular diastolic  parameters are consistent with Grade II diastolic dysfunction  (pseudonormalization). Elevated left ventricular end-diastolic pressure.  2. Right ventricular systolic function is normal. The right ventricular  size is normal. There is mildly elevated pulmonary  artery systolic  pressure.  3. Left atrial size was moderately dilated.  4. The mitral valve is abnormal. Trivial mitral valve regurgitation.  5. The aortic valve has been repaired/replaced. Perivalvular  regurgitation is not visualized. There is a 25 mm Edwards Inspiris Resilia  valve present in the aortic position. Procedure Date: 08/06/19. Aortic valve  mean gradient measures 13.0 mmHg. Aortic  valve Vmax measures 2.47 m/s. Peak gradient 24 mmHg. DI 0.75.  6. Cannot exclude PFO by color doppler.  7. The inferior vena cava is normal in size with greater than 50%  respiratory variability, suggesting right atrial pressure of 3 mmHg.   Comparison(s): 07/21/19 EF 40%. Severe AS 13mHg mean PG, 1312mg peak PG.     Recent Labs: 07/21/2019: B Natriuretic Peptide 1,142.6 08/06/2019: Magnesium 2.0 08/12/2019: Hemoglobin 8.9; Platelets 176 12/04/2019: ALT 20; BUN 14; Creatinine, Ser 0.93; Potassium 4.4;  Sodium 139; TSH 8.980  Recent Lipid Panel    Component Value Date/Time   CHOL 128 07/21/2019 1515   TRIG 87 07/21/2019 1515   HDL 37 (L) 07/21/2019 1515   CHOLHDL 3.5 07/21/2019 1515   VLDL 17 07/21/2019 1515   LDLCALC 74 07/21/2019 1515     Risk Assessment/Calculations:       Physical Exam:    VS:  BP 110/80   Pulse 73   Ht 6' (1.829 m)   Wt 237 lb 6.4 oz (107.7 kg)   SpO2 97%   BMI 32.20 kg/m     Wt Readings from Last 3 Encounters:  01/12/20 237 lb 6.4 oz (107.7 kg)  12/10/19 229 lb 9.6 oz (104.1 kg)  11/23/19 233 lb 11 oz (106 kg)     GEN:  Well nourished, well developed in no acute distress HEENT: Normal NECK: No JVD; No carotid bruits LYMPHATICS: No lymphadenopathy CARDIAC: RRR, no murmurs, rubs, gallops RESPIRATORY:  Clear to auscultation without rales, wheezing or rhonchi  ABDOMEN: Soft, non-tender, non-distended MUSCULOSKELETAL:  No edema; No deformity  SKIN: Warm and dry NEUROLOGIC:  Alert and oriented x 3 PSYCHIATRIC:  Normal affect   ASSESSMENT:     No diagnosis found. PLAN:    In order of problems listed above:  Tissue aortic valve replacement 25 mm -Doing very well. -Echocardiogram as above reviewed.  Excellent. -Dental prophylaxis important.  Systolic heart failure -EF has improved significantly post valve.  Now normal.  Resolved. --Lasix now 56m PRN --We will go ahead and stop his carvedilol.  Working to [employer] with the balls.    Shared Decision Making/Informed Consent      Medication Adjustments/Labs and Tests Ordered: Current medicines are reviewed at length with the patient today.  Concerns regarding medicines are outlined above.  No orders of the defined types were placed in this encounter.  Meds ordered this encounter  Medications  . aspirin EC 81 MG tablet    Sig: Take 1 tablet (81 mg total) by mouth daily. Swallow whole.    Dispense:  30 tablet    Refill:  11  . furosemide (LASIX) 40 MG tablet    Sig: Take 1 tablet (40 mg total) by mouth as needed.    Dispense:  30 tablet    Refill:  6    Pt will call when needed    Patient Instructions  Medication Instructions:  Please stop your Carvedilol. Decrease Aspirin to 81 mg a day. You may take Furosemide on an as needed basis. Continue all other medications as listed.  *If you need a refill on your cardiac medications before your next appointment, please call your pharmacy*  Follow-Up: [facility] and your [facility] needs are our priority.  As part of our continuing mission to provide you with exceptional heart care, we have created designated Provider Care Teams.  These Care Teams include your primary Cardiologist (physician) and Advanced Practice Providers (APPs -  Physician Assistants and Nurse Practitioners) who all work together to provide you with the care you need, when you need it.  We recommend signing up for the patient portal called "MyChart".  Sign up information is provided on this After Visit Summary.  MyChart is  used to connect with patients for Virtual Visits (Telemedicine).  Patients are able to view lab/test results, encounter notes, upcoming appointments, etc.  Non-urgent messages can be sent to your provider as well.   To learn more about what you can  do with MyChart, go to [email or web address].    Your next appointment:   6 month(s)  The format for your next appointment:   In Person  Provider:   Candee Furbish, MD   Thank you for choosing [facility]!!         Signed, Candee Furbish, MD  01/12/2020 4:48 PM    [place]

## 2020-02-12 ENCOUNTER — Telehealth: Payer: Self-pay | Admitting: Emergency Medicine

## 2020-02-12 NOTE — Telephone Encounter (Signed)
provider not available / LVM for pt to call back to reschedule

## 2020-03-01 ENCOUNTER — Encounter: Payer: 59 | Admitting: Emergency Medicine

## 2020-04-18 ENCOUNTER — Encounter: Payer: Self-pay | Admitting: Registered Nurse

## 2020-04-18 ENCOUNTER — Other Ambulatory Visit: Payer: Self-pay

## 2020-04-18 ENCOUNTER — Ambulatory Visit (INDEPENDENT_AMBULATORY_CARE_PROVIDER_SITE_OTHER): Payer: 59 | Admitting: Registered Nurse

## 2020-04-18 VITALS — BP 143/82 | HR 66 | Temp 98.0°F | Resp 18 | Ht 72.0 in | Wt 243.2 lb

## 2020-04-18 DIAGNOSIS — Z953 Presence of xenogenic heart valve: Secondary | ICD-10-CM

## 2020-04-18 DIAGNOSIS — R739 Hyperglycemia, unspecified: Secondary | ICD-10-CM

## 2020-04-18 DIAGNOSIS — Z7689 Persons encountering health services in other specified circumstances: Secondary | ICD-10-CM

## 2020-04-18 DIAGNOSIS — E039 Hypothyroidism, unspecified: Secondary | ICD-10-CM | POA: Diagnosis not present

## 2020-04-18 MED ORDER — AMOXICILLIN 500 MG PO CAPS: ORAL_CAPSULE | ORAL | 5 refills | Status: DC

## 2020-04-18 NOTE — Patient Instructions (Signed)
° ° ° °  If you have lab work done today you will be contacted with your lab results within the next 2 weeks.  If you have not heard from us then please contact us. The fastest way to get your results is to register for My Chart. ° ° °IF you received an x-ray today, you will receive an invoice from [facility]. Please contact [facility] at [phone] with questions or concerns regarding your invoice.  ° °IF you received labwork today, you will receive an invoice from [facility]. Please contact [facility] at [phone] with questions or concerns regarding your invoice.  ° °Our billing staff will not be able to assist you with questions regarding bills from these companies. ° °You will be contacted with the lab results as soon as they are available. The fastest way to get your results is to activate your My Chart account. Instructions are located on the last page of this paperwork. If you have not heard from us regarding the results in 2 weeks, please contact this office. °  ° ° ° °

## 2020-04-18 NOTE — Progress Notes (Signed)
Established Patient Office Visit  Subjective:  Patient ID: Steve Richards, male    DOB: Nov 21, 1965  Age: 55 y.o. MRN: [id number]  CC:  Chief Complaint  Patient presents with  . Transitions Of Care    Patient states he is here for [facility] and labs.    HPI Steve Richards presents for visit to est care.  Histories reviewed. Overall he has been healthy until last year when it was determined that he had a stenotic aortic valve which was then replaced. Hospital course thoroughly detailed in medical history. Has been stable since June 2021. Using lasix daily as needed for swelling but does not use this often  At that time noted that his sugars were high with A1c over 6. Has been on metformin 540m PO bid which was then reduced to qd at last visit with past PCP Dr. SPamella Pert Would prefer to stop if sugars continue under good control. Last a1c at 5.7  Noted to be hypothyroid as well - started on 268m synthroid, then increased to 5017mpo qd. Last tsh over 8. Will recheck today. Denies any symptoms of thyroid dysfunction.  Past Medical History:  Diagnosis Date  . Ascites 07/15/2019  . Cellulitis and abscess of left lower extremity 07/21/2019  . Chronic combined systolic (congestive) and diastolic (congestive) heart failure ([place] . 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 Critical aortic valve stenosis   . Hepatic cirrhosis ([place]/02/[place]  . Hyperglycemia 07/21/2019  . Left kidney mass 07/15/2019  . Morbid obesity ([place] . Pleural effusion   . Pleural effusion on right 07/15/2019  . S/P aortic valve replacement with bioprosthetic valve 08/06/2019   Size 25 mm // Echocardiogram 8/21: EF 55-60, mod LVH, Gr 2 DD, normal RVSF, mod LAE, trivial MR, AVR w mean 13 mmHg and no PVL  . Severe aortic stenosis   . Splenomegaly 07/15/2019  . Systolic ejection murmur 07/18/35/4827 Past Surgical History:  Procedure Laterality Date  . AORTIC VALVE REPLACEMENT N/A 08/06/2019   Procedure: AORTIC VALVE  REPLACEMENT (AVR) using EdwMargaretha Sheffieldsilia 25 MM Aortic Valve.;  Surgeon: BarGaye PollackD;  [facility];  Service: Open Heart Surgery;  Laterality: N/A;  . CARDIAC CATHETERIZATION    . EXPLORATION POST OPERATIVE OPEN HEART N/A 08/06/2019   Procedure: EXPLORATION POST OPERATIVE OPEN HEART;  Surgeon: BarGaye PollackD;  [facility] [place];  Laterality: N/A;  . IR THORACENTESIS ASP PLEURAL SPACE W/IMG GUIDE  06/25/2019  . RIGHT/LEFT HEART CATH AND CORONARY ANGIOGRAPHY N/A 07/28/2019   Procedure: RIGHT/LEFT HEART CATH AND CORONARY ANGIOGRAPHY;  Surgeon: CooSherren MochaD;  Location[facility] [place] [facility];  Service: Cardiovascular;  Laterality: N/A;  . TEE WITHOUT CARDIOVERSION N/A 08/06/2019   Procedure: TRANSESOPHAGEAL ECHOCARDIOGRAM (TEE);  Surgeon: BarGaye PollackD;  [facility] [place];  Laterality: N/A;  . WISDOM TOOTH EXTRACTION     college age  . WRIST FRACTURE SURGERY     grade school    Family History  Problem Relation Age of Onset  . Osteoporosis Mother   . Other Father        died in a hurricane    Social History   Socioeconomic History  . Marital status: Single    Spouse name: Not on file  . Number of children: Not on file  . Years of education: 16 11 Highest education level: Bachelor's degree (e.g.,  BA, AB, BS)  Occupational History  . 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 Smoking status: Never Smoker  . Smokeless tobacco: Never Used  Vaping Use  . Vaping Use: Never used  Substance and Sexual Activity  . Alcohol use: Yes    Comment: rarely  . Drug use: Never  . Sexual activity: Not on file  Other Topics Concern  . Not on file  Social History Narrative  . Not on file   Social Determinants of Health   Financial Resource Strain: Not on file  Food Insecurity: Not on file  Transportation Needs: Not on file  Physical Activity: Not on file  Stress: Not on file  Social  Connections: Not on file  Intimate Partner Violence: Not on file    Outpatient Medications Prior to Visit  Medication Sig Dispense Refill  . acetaminophen (TYLENOL) 325 MG tablet Take 2 tablets (650 mg total) by mouth every 6 (six) hours as needed for mild pain.    Marland Kitchen amoxicillin (AMOXIL) 500 MG capsule Take 4 capsules by mouth 30-60 minutes before dental procedures 4 capsule 2  . aspirin EC 81 MG tablet Take 1 tablet (81 mg total) by mouth daily. Swallow whole. 30 tablet 11  . furosemide (LASIX) 40 MG tablet Take 1 tablet (40 mg total) by mouth as needed. 30 tablet 6  . levothyroxine (SYNTHROID) 50 MCG tablet Take 1 tablet (50 mcg total) by mouth daily before breakfast. 90 tablet 1  . metFORMIN (GLUCOPHAGE) 500 MG tablet Take 1 tablet (500 mg total) by mouth daily with breakfast. 90 tablet 1  . Multiple Vitamin (MULTIVITAMIN) tablet Take 1 tablet by mouth daily.     No facility-administered medications prior to visit.    No Known Allergies  ROS Review of Systems  Constitutional: Negative.   HENT: Negative.   Eyes: Negative.   Respiratory: Negative.   Cardiovascular: Negative.   Gastrointestinal: Negative.   Genitourinary: Negative.   Musculoskeletal: Negative.   Skin: Negative.   Neurological: Negative.   Psychiatric/Behavioral: Negative.   All other systems reviewed and are negative.     Objective:    Physical Exam Constitutional:      General: He is not in acute distress.    Appearance: Normal appearance. He is normal weight. He is not ill-appearing, toxic-appearing or diaphoretic.  Cardiovascular:     Rate and Rhythm: Normal rate and regular rhythm.     Heart sounds: Normal heart sounds. No murmur heard. No friction rub. No gallop.   Pulmonary:     Effort: Pulmonary effort is normal. No respiratory distress.     Breath sounds: Normal breath sounds. No stridor. No wheezing, rhonchi or rales.  Chest:     Chest wall: No tenderness.  Neurological:     General: No  focal deficit present.     Mental Status: He is alert and oriented to person, place, and time. Mental status is at baseline.  Psychiatric:        Mood and Affect: Mood normal.        Behavior: Behavior normal.        Thought Content: Thought content normal.        Judgment: Judgment normal.     BP (!) 143/82   Pulse 66   Temp 98 F (36.7 C) (Temporal)   Resp 18   Ht 6' (1.829 m)   Wt 243 lb 3.2 oz (110.3 kg)   SpO2 100%   BMI 32.98 kg/m  Wt Readings from Last 3 Encounters:  04/18/20 243 lb 3.2 oz (110.3 kg)  01/12/20 237 lb 6.4 oz (107.7 kg)  12/10/19 229 lb 9.6 oz (104.1 kg)     There are no preventive care reminders to display for this patient.  There are no preventive care reminders to display for this patient.  Lab Results  Component Value Date   TSH 8.980 (H) 12/04/2019   Lab Results  Component Value Date   WBC 7.7 08/12/2019   HGB 8.9 (L) 08/12/2019   HCT 28.4 (L) 08/12/2019   MCV 95.6 08/12/2019   PLT 176 08/12/2019   Lab Results  Component Value Date   NA 139 12/04/2019   K 4.4 12/04/2019   CO2 23 12/04/2019   GLUCOSE 81 12/04/2019   BUN 14 12/04/2019   CREATININE 0.93 12/04/2019   BILITOT 1.1 12/04/2019   ALKPHOS 67 12/04/2019   AST 26 12/04/2019   ALT 20 12/04/2019   PROT 7.4 12/04/2019   ALBUMIN 4.7 12/04/2019   CALCIUM 9.5 12/04/2019   ANIONGAP 10 08/15/2019   Lab Results  Component Value Date   CHOL 128 07/21/2019   Lab Results  Component Value Date   HDL 37 (L) 07/21/2019   Lab Results  Component Value Date   LDLCALC 74 07/21/2019   Lab Results  Component Value Date   TRIG 87 07/21/2019   Lab Results  Component Value Date   CHOLHDL 3.5 07/21/2019   Lab Results  Component Value Date   HGBA1C 5.7 (H) 12/04/2019      Assessment & Plan:   Problem List Items Addressed This Visit      Endocrine   Hypothyroidism   Relevant Orders   Thyroid Panel With TSH     Other   Hyperglycemia   Relevant Orders   Comprehensive  metabolic panel   Hemoglobin A1c   S/P aortic valve replacement with bioprosthetic valve - Primary   Relevant Medications   amoxicillin (AMOXIL) 500 MG capsule   Other Relevant Orders   CBC With Differential   Comprehensive metabolic panel   Lipid panel   Hemoglobin A1c   Thyroid Panel With TSH    Other Visit Diagnoses    Encounter to establish care          No orders of the defined types were placed in this encounter.   Follow-up: No follow-ups on file.   PLAN  Labs collected. Will follow up with the patient as warranted.  Will adjust medications as labs indicate  Refill amoxicillin for upcoming dental work  Patient encouraged to call clinic with any questions, comments, or concerns.  Maximiano Coss, NP

## 2020-04-19 LAB — CBC WITH DIFFERENTIAL
Basophils Absolute: 0 10*3/uL (ref 0.0–0.2)
Basos: 1 %
EOS (ABSOLUTE): 0.1 10*3/uL (ref 0.0–0.4)
Eos: 2 %
Hematocrit: 51.9 % — ABNORMAL HIGH (ref 37.5–51.0)
Hemoglobin: 17.4 g/dL (ref 13.0–17.7)
Immature Grans (Abs): 0 10*3/uL (ref 0.0–0.1)
Immature Granulocytes: 0 %
Lymphocytes Absolute: 1.4 10*3/uL (ref 0.7–3.1)
Lymphs: 21 %
MCH: 29.8 pg (ref 26.6–33.0)
MCHC: 33.5 g/dL (ref 31.5–35.7)
MCV: 89 fL (ref 79–97)
Monocytes Absolute: 0.6 10*3/uL (ref 0.1–0.9)
Monocytes: 8 %
Neutrophils Absolute: 4.5 10*3/uL (ref 1.4–7.0)
Neutrophils: 68 %
RBC: 5.84 x10E6/uL — ABNORMAL HIGH (ref 4.14–5.80)
RDW: 13.1 % (ref 11.6–15.4)
WBC: 6.7 10*3/uL (ref 3.4–10.8)

## 2020-04-19 LAB — LIPID PANEL
Chol/HDL Ratio: 3.7 ratio (ref 0.0–5.0)
Cholesterol, Total: 193 mg/dL (ref 100–199)
HDL: 52 mg/dL (ref >39)
LDL Chol Calc (NIH): 113 mg/dL — ABNORMAL HIGH (ref 0–99)
Triglycerides: 159 mg/dL — ABNORMAL HIGH (ref 0–149)
VLDL Cholesterol Cal: 28 mg/dL (ref 5–40)

## 2020-04-19 LAB — COMPREHENSIVE METABOLIC PANEL
ALT: 26 IU/L (ref 0–44)
AST: 26 IU/L (ref 0–40)
Albumin/Globulin Ratio: 1.7 (ref 1.2–2.2)
Albumin: 4.7 g/dL (ref 3.8–4.9)
Alkaline Phosphatase: 64 IU/L (ref 44–121)
BUN/Creatinine Ratio: 23 — ABNORMAL HIGH (ref 9–20)
BUN: 19 mg/dL (ref 6–24)
Bilirubin Total: 0.6 mg/dL (ref 0.0–1.2)
CO2: 21 mmol/L (ref 20–29)
Calcium: 9.4 mg/dL (ref 8.7–10.2)
Chloride: 103 mmol/L (ref 96–106)
Creatinine, Ser: 0.83 mg/dL (ref 0.76–1.27)
GFR calc Af Amer: 115 mL/min/{1.73_m2} (ref >59)
GFR calc non Af Amer: 100 mL/min/{1.73_m2} (ref >59)
Globulin, Total: 2.7 g/dL (ref 1.5–4.5)
Glucose: 108 mg/dL — ABNORMAL HIGH (ref 65–99)
Potassium: 5 mmol/L (ref 3.5–5.2)
Sodium: 140 mmol/L (ref 134–144)
Total Protein: 7.4 g/dL (ref 6.0–8.5)

## 2020-04-19 LAB — THYROID PANEL WITH TSH
Free Thyroxine Index: 1.3 (ref 1.2–4.9)
T3 Uptake Ratio: 23 % — ABNORMAL LOW (ref 24–39)
T4, Total: 5.5 ug/dL (ref 4.5–12.0)
TSH: 7.37 u[IU]/mL — ABNORMAL HIGH (ref 0.450–4.500)

## 2020-04-19 LAB — HEMOGLOBIN A1C
Est. average glucose Bld gHb Est-mCnc: 117 mg/dL
Hgb A1c MFr Bld: 5.7 % — ABNORMAL HIGH (ref 4.8–5.6)

## 2020-07-19 ENCOUNTER — Encounter: Payer: Self-pay | Admitting: Cardiology

## 2020-07-19 ENCOUNTER — Ambulatory Visit: Payer: 59 | Admitting: Cardiology

## 2020-07-19 ENCOUNTER — Other Ambulatory Visit: Payer: Self-pay

## 2020-07-19 VITALS — BP 120/80 | HR 62 | Ht 72.0 in | Wt 255.0 lb

## 2020-07-19 DIAGNOSIS — Z952 Presence of prosthetic heart valve: Secondary | ICD-10-CM

## 2020-07-19 DIAGNOSIS — I5042 Chronic combined systolic (congestive) and diastolic (congestive) heart failure: Secondary | ICD-10-CM | POA: Diagnosis not present

## 2020-07-19 MED ORDER — SILDENAFIL CITRATE 100 MG PO TABS: 100.0000 mg | ORAL_TABLET | Freq: Every day | ORAL | 6 refills | Status: DC | PRN

## 2020-07-19 NOTE — Patient Instructions (Signed)
Medication Instructions:  You may take Viagra 100 mg 1/2 tablet as needed. Continue all other medications as listed.  *If you need a refill on your cardiac medications before your next appointment, please call your pharmacy*  Follow-Up: At [facility], you and your health needs are our priority.  As part of our continuing mission to provide you with exceptional heart care, we have created designated Provider Care Teams.  These Care Teams include your primary Cardiologist (physician) and Advanced Practice Providers (APPs -  Physician Assistants and Nurse Practitioners) who all work together to provide you with the care you need, when you need it.  We recommend signing up for the patient portal called "MyChart".  Sign up information is provided on this After Visit Summary.  MyChart is used to connect with patients for Virtual Visits (Telemedicine).  Patients are able to view lab/test results, encounter notes, upcoming appointments, etc.  Non-urgent messages can be sent to your provider as well.   To learn more about what you can do with MyChart, go to [email or web address].    Your next appointment:   12 month(s)  The format for your next appointment:   In Person  Provider:   Candee Furbish, MD   Thank you for choosing [facility]!!

## 2020-07-19 NOTE — Progress Notes (Signed)
Cardiology Office Note:    Date:  07/19/2020   ID:  Steve Richards, DOB 29-Mar-1965, MRN [id number]  PCP:  Maximiano Coss, NP   [facility] Providers Cardiologist:  Candee Furbish, MD     Referring MD: Maximiano Coss, NP     History of Present Illness:    Steve Richards is a 54 y.o. male here for the follow-up of aortic valve replacement with 25 mm pericardial tissue valve on 08/06/2019.  He had stage D critical symptomatic aortic stenosis with class IV symptoms.  Doing very well postoperatively without any fevers chills nausea vomiting syncope bleeding.  He is walking more.  5K running of the balls. [place] as well. Ran 1/2 Marathon. Seeing family in [place].   Past Medical History:  Diagnosis Date  . Ascites 07/15/2019  . Cellulitis and abscess of left lower extremity 07/21/2019  . Chronic combined systolic (congestive) and diastolic (congestive) heart failure ([place])   . Cirrhosis of liver with ascites ([place])   . Critical aortic valve stenosis   . Hepatic cirrhosis ([place]) 07/15/2019  . Hyperglycemia 07/21/2019  . Left kidney mass 07/15/2019  . Morbid obesity ([place])   . Pleural effusion   . Pleural effusion on right 07/15/2019  . S/P aortic valve replacement with bioprosthetic valve 08/06/2019   Size 25 mm // Echocardiogram 8/21: EF 55-60, mod LVH, Gr 2 DD, normal RVSF, mod LAE, trivial MR, AVR w mean 13 mmHg and no PVL  . Severe aortic stenosis   . Splenomegaly 07/15/2019  . Systolic ejection murmur 07/11/3265    Past Surgical History:  Procedure Laterality Date  . AORTIC VALVE REPLACEMENT N/A 08/06/2019   Procedure: AORTIC VALVE REPLACEMENT (AVR) using Margaretha Sheffield Resilia 25 MM Aortic Valve.;  Surgeon: Gaye Pollack, MD;  [facility];  Service: Open Heart Surgery;  Laterality: N/A;  . CARDIAC CATHETERIZATION    . EXPLORATION POST OPERATIVE OPEN HEART N/A 08/06/2019   Procedure: EXPLORATION POST OPERATIVE OPEN HEART;  Surgeon: Gaye Pollack, MD;  Location: [place];  Service:  Open Heart Surgery;  Laterality: N/A;  . IR THORACENTESIS ASP PLEURAL SPACE W/IMG GUIDE  06/25/2019  . RIGHT/LEFT HEART CATH AND CORONARY ANGIOGRAPHY N/A 07/28/2019   Procedure: RIGHT/LEFT HEART CATH AND CORONARY ANGIOGRAPHY;  Surgeon: Sherren Mocha, MD;  Location: [place] CV LAB;  Service: Cardiovascular;  Laterality: N/A;  . TEE WITHOUT CARDIOVERSION N/A 08/06/2019   Procedure: TRANSESOPHAGEAL ECHOCARDIOGRAM (TEE);  Surgeon: Gaye Pollack, MD;  Location: [place];  Service: Open Heart Surgery;  Laterality: N/A;  . WISDOM TOOTH EXTRACTION     college age  . WRIST FRACTURE SURGERY     grade school    Current Medications: Current Meds  Medication Sig  . acetaminophen (TYLENOL) 325 MG tablet Take 2 tablets (650 mg total) by mouth every 6 (six) hours as needed for mild pain.  Marland Kitchen amoxicillin (AMOXIL) 500 MG capsule Take 4 capsules by mouth 30-60 minutes before dental procedures  . aspirin EC 81 MG tablet Take 1 tablet (81 mg total) by mouth daily. Swallow whole.  . furosemide (LASIX) 40 MG tablet Take 1 tablet (40 mg total) by mouth as needed.  Marland Kitchen levothyroxine (SYNTHROID) 50 MCG tablet Take 1 tablet (50 mcg total) by mouth daily before breakfast.  . metFORMIN (GLUCOPHAGE) 500 MG tablet Take 1 tablet (500 mg total) by mouth daily with breakfast.  . Multiple Vitamin (MULTIVITAMIN) tablet Take 1 tablet by mouth daily.  . sildenafil (VIAGRA) 100 MG tablet Take 1  tablet (100 mg total) by mouth daily as needed for erectile dysfunction.     Allergies:   Patient has no known allergies.   Social History   Socioeconomic History  . Marital status: Single    Spouse name: Not on file  . Number of children: Not on file  . Years of education: 80  . Highest education level: Bachelor's degree (e.g., BA, AB, BS)  Occupational History  . 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 Smoking status: Never Smoker  . Smokeless tobacco: Never Used  Vaping Use  . Vaping Use: Never used   Substance and Sexual Activity  . Alcohol use: Yes    Comment: rarely  . Drug use: Never  . Sexual activity: Not on file  Other Topics Concern  . Not on file  Social History Narrative  . Not on file   Social Determinants of Health   Financial Resource Strain: Not on file  Food Insecurity: Not on file  Transportation Needs: Not on file  Physical Activity: Not on file  Stress: Not on file  Social Connections: Not on file     Family History: The patient's family history includes Osteoporosis in his mother; Other in his father.  ROS:   Please see the history of present illness.     All other systems reviewed and are negative.  EKGs/Labs/Other Studies Reviewed:    The following studies were reviewed today:  ECHO 2021:  1. Left ventricular ejection fraction, by estimation, is 55 to 60%. The  left ventricle has normal function. The left ventricle has no regional  wall motion abnormalities. There is moderate left ventricular hypertrophy.  Left ventricular diastolic  parameters are consistent with Grade II diastolic dysfunction  (pseudonormalization). Elevated left ventricular end-diastolic pressure.  2. Right ventricular systolic function is normal. The right ventricular  size is normal. There is mildly elevated pulmonary artery systolic  pressure.  3. Left atrial size was moderately dilated.  4. The mitral valve is abnormal. Trivial mitral valve regurgitation.  5. The aortic valve has been repaired/replaced. Perivalvular  regurgitation is not visualized. There is a 25 mm Edwards Inspiris Resilia  valve present in the aortic position. Procedure Date: 08/06/19. Aortic valve  mean gradient measures 13.0 mmHg. Aortic  valve Vmax measures 2.47 m/s. Peak gradient 24 mmHg. DI 0.75.  6. Cannot exclude PFO by color doppler.  7. The inferior vena cava is normal in size with greater than 50%  respiratory variability, suggesting right atrial pressure of 3 mmHg.    Comparison(s): 07/21/19 EF 40%. Severe AS 82mHg mean PG, 1375mg peak PG.   EKG:  EKG is  ordered today.  The ekg ordered today demonstrates sinus rhythm 60 with nonspecific ST-T wave changes.  Recent Labs: 07/21/2019: B Natriuretic Peptide 1,142.6 08/06/2019: Magnesium 2.0 08/12/2019: Platelets 176 04/18/2020: ALT 26; BUN 19; Creatinine, Ser 0.83; Hemoglobin 17.4; Potassium 5.0; Sodium 140; TSH 7.370  Recent Lipid Panel    Component Value Date/Time   CHOL 193 04/18/2020 1351   TRIG 159 (H) 04/18/2020 1351   HDL 52 04/18/2020 1351   CHOLHDL 3.7 04/18/2020 1351   CHOLHDL 3.5 07/21/2019 1515   VLDL 17 07/21/2019 1515   LDLCALC 113 (H) 04/18/2020 1351     Risk Assessment/Calculations:      Physical Exam:    VS:  BP 120/80 (BP Location: Left Arm, Patient Position: Sitting, Cuff Size: Large)   Pulse 62   Ht 6' (1.829 m)   [facility]  255 lb (115.7 kg)   BMI 34.58 kg/m     Wt Readings from Last 3 Encounters:  07/19/20 255 lb (115.7 kg)  04/18/20 243 lb 3.2 oz (110.3 kg)  01/12/20 237 lb 6.4 oz (107.7 kg)     GEN:  Well nourished, well developed in no acute distress HEENT: Normal NECK: No JVD; No carotid bruits LYMPHATICS: No lymphadenopathy CARDIAC: RRR, no murmurs, rubs, gallops RESPIRATORY:  Clear to auscultation without rales, wheezing or rhonchi  ABDOMEN: Soft, non-tender, non-distended MUSCULOSKELETAL:  No edema; No deformity  SKIN: Warm and dry NEUROLOGIC:  Alert and oriented x 3 PSYCHIATRIC:  Normal affect   ASSESSMENT:    1. Chronic combined systolic and diastolic CHF (congestive heart failure) ([place])   2. S/P AVR (aortic valve replacement)    PLAN:    In order of problems listed above:  Severe aortic stenosis status post aortic valve replacement 25 mm Dr. Cyndia Bent - Excellent recovery.  Doing well.  Running, jogging.  Has run a half marathon. - Continue with dental prophylaxis.  Previous cardiomyopathy - Excellent improvement post valve replacement.   Echocardiogram as above.  Consider repeating echocardiogram in approximately 2 years.  Erectile dysfunction - We will give him Viagra 100 mg 6 tablets with 3 refills.  He can take half of the tablet if necessary.  He is going to [facility], hike down.      Medication Adjustments/Labs and Tests Ordered: Current medicines are reviewed at length with the patient today.  Concerns regarding medicines are outlined above.  Orders Placed This Encounter  Procedures  . EKG 12-Lead   Meds ordered this encounter  Medications  . sildenafil (VIAGRA) 100 MG tablet    Sig: Take 1 tablet (100 mg total) by mouth daily as needed for erectile dysfunction.    Dispense:  10 tablet    Refill:  6    Patient Instructions  Medication Instructions:  You may take Viagra 100 mg 1/2 tablet as needed. Continue all other medications as listed.  *If you need a refill on your cardiac medications before your next appointment, please call your pharmacy*  Follow-Up: At [facility], you and your health needs are our priority.  As part of our continuing mission to provide you with exceptional heart care, we have created designated Provider Care Teams.  These Care Teams include your primary Cardiologist (physician) and Advanced Practice Providers (APPs -  Physician Assistants and Nurse Practitioners) who all work together to provide you with the care you need, when you need it.  We recommend signing up for the patient portal called "MyChart".  Sign up information is provided on this After Visit Summary.  MyChart is used to connect with patients for Virtual Visits (Telemedicine).  Patients are able to view lab/test results, encounter notes, upcoming appointments, etc.  Non-urgent messages can be sent to your provider as well.   To learn more about what you can do with MyChart, go to [email or web address].    Your next appointment:   12 month(s)  The format for your next appointment:   In Person  Provider:    Candee Furbish, MD   Thank you for choosing [facility]!!        Signed, Candee Furbish, MD  07/19/2020 4:12 PM    [place]

## 2020-07-20 ENCOUNTER — Telehealth: Payer: Self-pay

## 2020-07-20 NOTE — Telephone Encounter (Signed)
**Note De-Identified Peregrine Nolt Obfuscation** I started a Sildenafil PA through covermymeds. Key: [id number]

## 2020-07-27 NOTE — Telephone Encounter (Signed)
Printed and faxed

## 2020-07-27 NOTE — Telephone Encounter (Signed)
**Note De-Identified Mercedes Valeriano Obfuscation** We did not receive a approval or denial from covermymeds concerning the pts Sildenafil PA so I called the pharmacy help desk at the phone number found on the back of the pts [place] ins card.  I did the Sildenafil PA over the phone with Merry Proud at [facility]. Merry Proud recommended that I write a letter to the PA Team explaining that the 2 PAs that were done through covermymeds on 7/65 were duplicates and to request that they close them out and that the verbal PA we did over the phone this morning is the correct and valid Sildenafil PA.  Merry Proud states that they will reach out to [place] if they need anything else regarding the pts Sildenafil PA.  I wrote the letter and e-mailed it to the nurse working with Dr Marlou Porch today so she can fax the letter to [facility] at the fax number written on the cover letter included or to place in the "to be faxed" box in Medical Records to be faxed.

## 2020-07-29 NOTE — Telephone Encounter (Signed)
**Note De-Identified Kade Rickels Obfuscation** Sildenafil PA form faxed to [place] from [facility]. I have completed the form and have emailed it to Dr Marlou Porch nurse so she can obtain his signature and to fax back to [place] at the fax number written on the cover letter included or to place in the "to be faxed" box in Medical Records to be faxed.

## 2020-08-02 NOTE — Telephone Encounter (Signed)
Received email, printed, had Dr Marlou Porch sign and faxed to # on cover sheet as requested.

## 2020-08-08 ENCOUNTER — Other Ambulatory Visit: Payer: Self-pay

## 2020-08-08 MED ORDER — LEVOTHYROXINE SODIUM 50 MCG PO TABS: 50.0000 ug | ORAL_TABLET | Freq: Every day | ORAL | 0 refills | Status: DC

## 2020-10-17 ENCOUNTER — Ambulatory Visit: Payer: Self-pay | Admitting: Registered Nurse

## 2020-11-01 ENCOUNTER — Encounter: Payer: Self-pay | Admitting: Registered Nurse

## 2020-11-01 ENCOUNTER — Ambulatory Visit (INDEPENDENT_AMBULATORY_CARE_PROVIDER_SITE_OTHER): Payer: 59 | Admitting: Registered Nurse

## 2020-11-01 ENCOUNTER — Other Ambulatory Visit: Payer: Self-pay

## 2020-11-01 VITALS — BP 130/82 | HR 80 | Temp 98.4°F | Resp 17 | Ht 72.0 in | Wt 273.0 lb

## 2020-11-01 DIAGNOSIS — G8929 Other chronic pain: Secondary | ICD-10-CM

## 2020-11-01 DIAGNOSIS — M25531 Pain in right wrist: Secondary | ICD-10-CM

## 2020-11-01 DIAGNOSIS — E039 Hypothyroidism, unspecified: Secondary | ICD-10-CM

## 2020-11-01 NOTE — Progress Notes (Signed)
Established Patient Office Visit  Subjective:  Patient ID: Steve Richards, male    DOB: 1965/09/07  Age: 55 y.o. MRN: [id number]  CC:  Chief Complaint  Patient presents with   Hypothyroidism    Everything is going well    HPI Steve Richards presents for hypothyroidism and arm pain  Hypothyroidism -  Steady, no acute concerns Notes weight gain since last visit - but has been exercising less and poor diet. Partly in relation to grief of his mother's passing in June.  Otherwise no symptoms of note, feeling well.  Arm pain: Radial, distal, R side.  Sometimes with extension or adduction of wrist. Doesn't always happen First noted when he was receiving practical from potential hire with [place] improved a lot since. Full strength and ROM No worsening symptoms, ongoing pain or bruising.  Past Medical History:  Diagnosis Date   Ascites 07/15/2019   Cellulitis and abscess of left lower extremity 07/21/2019   Chronic combined systolic (congestive) and diastolic (congestive) heart failure (HCC)    Cirrhosis of liver with ascites [facility])    Critical aortic valve stenosis    Hepatic cirrhosis ([place]) 07/15/2019   Hyperglycemia 07/21/2019   Left kidney mass 07/15/2019   Morbid obesity (HCC)    Pleural effusion    Pleural effusion on right 07/15/2019   S/P aortic valve replacement with bioprosthetic valve 08/06/2019   Size 25 mm // Echocardiogram 8/21: EF 55-60, mod LVH, Gr 2 DD, normal RVSF, mod LAE, trivial MR, AVR w mean 13 mmHg and no PVL   Severe aortic stenosis    Splenomegaly 3/41/9622   Systolic ejection murmur 2/97/9892    Past Surgical History:  Procedure Laterality Date   AORTIC VALVE REPLACEMENT N/A 08/06/2019   Procedure: AORTIC VALVE REPLACEMENT (AVR) using Margaretha Sheffield Resilia 25 MM Aortic Valve.;  Surgeon: Gaye Pollack, MD;  [facility];  Service: Open Heart Surgery;  Laterality: N/A;   CARDIAC CATHETERIZATION     EXPLORATION POST OPERATIVE OPEN HEART  N/A 08/06/2019   Procedure: EXPLORATION POST OPERATIVE OPEN HEART;  Surgeon: Gaye Pollack, MD;  Location: [place];  Service: Open Heart Surgery;  Laterality: N/A;   IR THORACENTESIS ASP PLEURAL SPACE W/IMG GUIDE  06/25/2019   RIGHT/LEFT HEART CATH AND CORONARY ANGIOGRAPHY N/A 07/28/2019   Procedure: RIGHT/LEFT HEART CATH AND CORONARY ANGIOGRAPHY;  Surgeon: Sherren Mocha, MD;  Location: [place] [facility];  Service: Cardiovascular;  Laterality: N/A;   TEE WITHOUT CARDIOVERSION N/A 08/06/2019   Procedure: TRANSESOPHAGEAL ECHOCARDIOGRAM (TEE);  Surgeon: Gaye Pollack, MD;  Location: [place];  Service: Open Heart Surgery;  Laterality: N/A;   WISDOM TOOTH EXTRACTION     college age   30 FRACTURE SURGERY     grade school    Family History  Problem Relation Age of Onset   Osteoporosis Mother    Other Father        died in a hurricane    Social History   Socioeconomic History   Marital status: Single    Spouse name: Not on file   Number of children: Not on file   Years of education: 16   Highest education level: Bachelor's degree (e.g., BA, AB, BS)  Occupational History   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[employer]  Tobacco Use   Smoking status: Never   Smokeless tobacco: Never  Vaping Use   Vaping Use: Never used  Substance and Sexual Activity   Alcohol use: Yes  Comment: rarely   Drug use: Never   Sexual activity: Not on file  Other Topics Concern   Not on file  Social History Narrative   Not on file   Social Determinants of Health   Financial Resource Strain: Not on file  Food Insecurity: Not on file  Transportation Needs: Not on file  Physical Activity: Not on file  Stress: Not on file  Social Connections: Not on file  Intimate Partner Violence: Not on file    Outpatient Medications Prior to Visit  Medication Sig Dispense Refill   acetaminophen (TYLENOL) 325 MG tablet Take 2 tablets (650 mg total) by mouth every 6 (six) hours as needed for mild pain.      amoxicillin (AMOXIL) 500 MG capsule Take 4 capsules by mouth 30-60 minutes before dental procedures 4 capsule 5   aspirin EC 81 MG tablet Take 1 tablet (81 mg total) by mouth daily. Swallow whole. 30 tablet 11   furosemide (LASIX) 40 MG tablet Take 1 tablet (40 mg total) by mouth as needed. 30 tablet 6   levothyroxine (SYNTHROID) 50 MCG tablet Take 1 tablet (50 mcg total) by mouth daily before breakfast. 90 tablet 0   metFORMIN (GLUCOPHAGE) 500 MG tablet Take 1 tablet (500 mg total) by mouth daily with breakfast. 90 tablet 1   Multiple Vitamin (MULTIVITAMIN) tablet Take 1 tablet by mouth daily.     sildenafil (VIAGRA) 100 MG tablet Take 1 tablet (100 mg total) by mouth daily as needed for erectile dysfunction. 10 tablet 6   No facility-administered medications prior to visit.    No Known Allergies  ROS Review of Systems  Constitutional: Negative.   HENT: Negative.    Eyes: Negative.   Respiratory: Negative.    Cardiovascular: Negative.   Gastrointestinal: Negative.   Genitourinary: Negative.   Musculoskeletal:  Positive for arthralgias (R wrist). Negative for back pain, gait problem, joint swelling, myalgias, neck pain and neck stiffness.  Skin: Negative.   Neurological: Negative.   Psychiatric/Behavioral: Negative.    All other systems reviewed and are negative.    Objective:    Physical Exam Constitutional:      General: He is not in acute distress.    Appearance: Normal appearance. He is normal weight. He is not ill-appearing, toxic-appearing or diaphoretic.  Cardiovascular:     Rate and Rhythm: Normal rate and regular rhythm.     Heart sounds: Normal heart sounds. No murmur heard.   No friction rub. No gallop.  Pulmonary:     Effort: Pulmonary effort is normal. No respiratory distress.     Breath sounds: Normal breath sounds. No stridor. No wheezing, rhonchi or rales.  Chest:     Chest wall: No tenderness.  Musculoskeletal:        General: Tenderness (mild snuff box  and distal radius pain in R side) present. No swelling, deformity or signs of injury. Normal range of motion.     Right lower leg: No edema.     Left lower leg: No edema.  Neurological:     General: No focal deficit present.     Mental Status: He is alert and oriented to person, place, and time. Mental status is at baseline.  Psychiatric:        Mood and Affect: Mood normal.        Behavior: Behavior normal.        Thought Content: Thought content normal.        Judgment: Judgment normal.  BP 130/82   Pulse 80   Temp 98.4 F (36.9 C) (Temporal)   Resp 17   Ht 6' (1.829 m)   Wt 273 lb (123.8 kg)   SpO2 98%   BMI 37.03 kg/m  Wt Readings from Last 3 Encounters:  11/01/20 273 lb (123.8 kg)  07/19/20 255 lb (115.7 kg)  04/18/20 243 lb 3.2 oz (110.3 kg)     There are no preventive care reminders to display for this patient.  There are no preventive care reminders to display for this patient.  Lab Results  Component Value Date   TSH 7.370 (H) 04/18/2020   Lab Results  Component Value Date   WBC 6.7 04/18/2020   HGB 17.4 04/18/2020   HCT 51.9 (H) 04/18/2020   MCV 89 04/18/2020   PLT 176 08/12/2019   Lab Results  Component Value Date   NA 140 04/18/2020   K 5.0 04/18/2020   CO2 21 04/18/2020   GLUCOSE 108 (H) 04/18/2020   BUN 19 04/18/2020   CREATININE 0.83 04/18/2020   BILITOT 0.6 04/18/2020   ALKPHOS 64 04/18/2020   AST 26 04/18/2020   ALT 26 04/18/2020   PROT 7.4 04/18/2020   ALBUMIN 4.7 04/18/2020   CALCIUM 9.4 04/18/2020   ANIONGAP 10 08/15/2019   Lab Results  Component Value Date   CHOL 193 04/18/2020   Lab Results  Component Value Date   HDL 52 04/18/2020   Lab Results  Component Value Date   LDLCALC 113 (H) 04/18/2020   Lab Results  Component Value Date   TRIG 159 (H) 04/18/2020   Lab Results  Component Value Date   CHOLHDL 3.7 04/18/2020   Lab Results  Component Value Date   HGBA1C 5.7 (H) 04/18/2020      Assessment & Plan:    Problem List Items Addressed This Visit       Endocrine   Hypothyroidism - Primary   Relevant Orders   TSH   T4, free   Other Visit Diagnoses     Chronic pain of right wrist           No orders of the defined types were placed in this encounter.   Follow-up: Return in about 6 months (around 05/02/2021) for thyroid.   PLAN Check labs, adjust synthroid as indicated. Return in 3 mo if adjusting, otherwise can plan on 6 mo follow up Wrist likely overuse injury, perhaps some tendonitis. Watch and wait. Ok to use OTC analgesics. Reviewed nonpharm. Can consider imaging if persistent or worsening. Ok to massage / give massages. Patient encouraged to call clinic with any questions, comments, or concerns.  Maximiano Coss, NP

## 2020-11-01 NOTE — Patient Instructions (Signed)
Mr. Mcadam -  Doristine Devoid to see you as always  Not terribly worried about the wrist. Full range of motion and strength is encouraging. If it progresses, worsens, changes, or if the hand falls off, please do let me know.  Checking on thyroid today. Any abnormalities, you'll be the first to know. Will adjust dosing if necessary. If adjusting dose, see you in 3 months. If keeping the same, 6 months.  Let me know if you need anything in the mean time -   Rich

## 2020-11-02 LAB — TSH: TSH: 6.87 u[IU]/mL — ABNORMAL HIGH (ref 0.35–5.50)

## 2020-11-02 LAB — T4, FREE: Free T4: 0.62 ng/dL (ref 0.60–1.60)

## 2020-11-04 ENCOUNTER — Other Ambulatory Visit: Payer: Self-pay | Admitting: Registered Nurse

## 2020-11-04 DIAGNOSIS — E039 Hypothyroidism, unspecified: Secondary | ICD-10-CM

## 2020-11-04 MED ORDER — LEVOTHYROXINE SODIUM 75 MCG PO TABS: 75.0000 ug | ORAL_TABLET | Freq: Every day | ORAL | 0 refills | Status: DC

## 2021-02-09 ENCOUNTER — Telehealth: Payer: Self-pay

## 2021-02-09 NOTE — Telephone Encounter (Signed)
Caller name:Leon Wojcicki   On DPR? :Yes  Call back number:[phone]  Provider they see: Richard  Reason for call:Pt is coming by saying that he was to have blood work done but when I went to make appt for lab there is nothing in the system for labs?

## 2021-02-09 NOTE — Telephone Encounter (Signed)
I reviewed chart and didn't see any phone encounters asking that he return for labs or anything in recent lab results asking that he come back for a lab visit either. Any ideas?

## 2021-02-10 NOTE — Telephone Encounter (Signed)
LVM for patient to return call. Steve Richards would like to see him in office next week if possible.

## 2021-02-10 NOTE — Telephone Encounter (Signed)
We increased dose of synthroid- would like to see for OV to discuss how this is going. Can do labs at that visit  Thanks,  Denice Paradise

## 2021-02-10 NOTE — Telephone Encounter (Signed)
Yes- next week if possible  Thanks,  Rich

## 2021-02-16 ENCOUNTER — Ambulatory Visit: Payer: 59 | Admitting: Registered Nurse

## 2021-02-17 ENCOUNTER — Ambulatory Visit (INDEPENDENT_AMBULATORY_CARE_PROVIDER_SITE_OTHER): Payer: 59 | Admitting: Registered Nurse

## 2021-02-17 ENCOUNTER — Encounter: Payer: Self-pay | Admitting: Registered Nurse

## 2021-02-17 VITALS — BP 124/84 | HR 72 | Temp 98.4°F | Resp 16 | Wt 283.8 lb

## 2021-02-17 DIAGNOSIS — G8929 Other chronic pain: Secondary | ICD-10-CM

## 2021-02-17 DIAGNOSIS — I8393 Asymptomatic varicose veins of bilateral lower extremities: Secondary | ICD-10-CM

## 2021-02-17 DIAGNOSIS — M25531 Pain in right wrist: Secondary | ICD-10-CM

## 2021-02-17 DIAGNOSIS — E039 Hypothyroidism, unspecified: Secondary | ICD-10-CM

## 2021-02-17 DIAGNOSIS — R7303 Prediabetes: Secondary | ICD-10-CM | POA: Diagnosis not present

## 2021-02-17 DIAGNOSIS — R5382 Chronic fatigue, unspecified: Secondary | ICD-10-CM

## 2021-02-17 DIAGNOSIS — R739 Hyperglycemia, unspecified: Secondary | ICD-10-CM | POA: Diagnosis not present

## 2021-02-17 DIAGNOSIS — Z953 Presence of xenogenic heart valve: Secondary | ICD-10-CM

## 2021-02-17 LAB — CBC WITH DIFFERENTIAL/PLATELET
Basophils Absolute: 0 10*3/uL (ref 0.0–0.1)
Basophils Relative: 0.4 % (ref 0.0–3.0)
Eosinophils Absolute: 0.1 10*3/uL (ref 0.0–0.7)
Eosinophils Relative: 2.5 % (ref 0.0–5.0)
HCT: 52.2 % — ABNORMAL HIGH (ref 39.0–52.0)
Hemoglobin: 17.2 g/dL — ABNORMAL HIGH (ref 13.0–17.0)
Lymphocytes Relative: 22.2 % (ref 12.0–46.0)
Lymphs Abs: 1.3 10*3/uL (ref 0.7–4.0)
MCHC: 32.9 g/dL (ref 30.0–36.0)
MCV: 90 fl (ref 78.0–100.0)
Monocytes Absolute: 0.4 10*3/uL (ref 0.1–1.0)
Monocytes Relative: 7.3 % (ref 3.0–12.0)
Neutro Abs: 3.9 10*3/uL (ref 1.4–7.7)
Neutrophils Relative %: 67.6 % (ref 43.0–77.0)
Platelets: 151 10*3/uL (ref 150.0–400.0)
RBC: 5.8 Mil/uL (ref 4.22–5.81)
RDW: 14.3 % (ref 11.5–15.5)
WBC: 5.8 10*3/uL (ref 4.0–10.5)

## 2021-02-17 LAB — COMPREHENSIVE METABOLIC PANEL
ALT: 24 U/L (ref 0–53)
AST: 22 U/L (ref 0–37)
Albumin: 4.4 g/dL (ref 3.5–5.2)
Alkaline Phosphatase: 46 U/L (ref 39–117)
BUN: 13 mg/dL (ref 6–23)
CO2: 25 mEq/L (ref 19–32)
Calcium: 9.3 mg/dL (ref 8.4–10.5)
Chloride: 102 mEq/L (ref 96–112)
Creatinine, Ser: 0.8 mg/dL (ref 0.40–1.50)
GFR: 99.64 mL/min (ref >60.00)
Glucose, Bld: 111 mg/dL — ABNORMAL HIGH (ref 70–99)
Potassium: 4.2 mEq/L (ref 3.5–5.1)
Sodium: 138 mEq/L (ref 135–145)
Total Bilirubin: 1 mg/dL (ref 0.2–1.2)
Total Protein: 7.2 g/dL (ref 6.0–8.3)

## 2021-02-17 LAB — LIPID PANEL
Cholesterol: 250 mg/dL — ABNORMAL HIGH (ref 0–200)
HDL: 44.6 mg/dL (ref >39.00)
NonHDL: 204.91
Total CHOL/HDL Ratio: 6
Triglycerides: 300 mg/dL — ABNORMAL HIGH (ref 0.0–149.0)
VLDL: 60 mg/dL — ABNORMAL HIGH (ref 0.0–40.0)

## 2021-02-17 LAB — B12 AND FOLATE PANEL
Folate: >23.4 ng/mL (ref >5.9)
Vitamin B-12: 885 pg/mL (ref 211–911)

## 2021-02-17 LAB — TSH: TSH: 4.77 u[IU]/mL (ref 0.35–5.50)

## 2021-02-17 LAB — VITAMIN D 25 HYDROXY (VIT D DEFICIENCY, FRACTURES): VITD: 23.61 ng/mL — ABNORMAL LOW (ref 30.00–100.00)

## 2021-02-17 LAB — LDL CHOLESTEROL, DIRECT: Direct LDL: 132 mg/dL

## 2021-02-17 LAB — HEMOGLOBIN A1C: Hgb A1c MFr Bld: 6.6 % — ABNORMAL HIGH (ref 4.6–6.5)

## 2021-02-17 NOTE — Progress Notes (Signed)
Established Patient Office Visit  Subjective:  Patient ID: Steve Richards, male    DOB: 1965/05/27  Age: 55 y.o. MRN: [id number]  CC:  Chief Complaint  Patient presents with   Hypothyroidism    HPI Steve Richards presents for thyroid  Lab Results  Component Value Date   TSH 6.87 (H) 11/01/2020   Feeling well on 43mg of synthroid daily.  No acute concerns. Would like to continue.   Past Medical History:  Diagnosis Date   Ascites 07/15/2019   Cellulitis and abscess of left lower extremity 07/21/2019   Chronic combined systolic (congestive) and diastolic (congestive) heart failure (HCC)    [facility]osis [facility] with ascites [facility]    Critical aortic valve stenosis    [place]atic [place] 07/15/2019   Hyperglycemia 07/21/2019   Left kidney mass 07/15/2019   Morbid obesity (HCC)    Pleural effusion    Pleural effusion on right 07/15/2019   S/P aortic valve replacement with bioprosthetic valve 08/06/2019   Size 25 mm // Echocardiogram 8/21: EF 55-60, mod LVH, Gr 2 DD, normal RVSF, mod LAE, trivial MR, AVR w mean 13 mmHg and no PVL   Severe aortic stenosis    Splenomegaly 59/62/8366  Systolic ejection murmur 52/94/7654   Past Surgical History:  Procedure Laterality Date   AORTIC VALVE REPLACEMENT N/A 08/06/2019   Procedure: AORTIC VALVE REPLACEMENT (AVR) using EMargaretha SheffieldResilia 25 MM Aortic Valve.;  Surgeon: BGaye Pollack MD;  [facility];  Service: Open Heart Surgery;  Laterality: N/A;   CARDIAC CATHETERIZATION     EXPLORATION POST OPERATIVE OPEN HEART N/A 08/06/2019   Procedure: EXPLORATION POST OPERATIVE OPEN HEART;  Surgeon: BGaye Pollack MD;  [place]  Service: Open Heart Surgery;  Laterality: N/A;   IR THORACENTESIS ASP PLEURAL SPACE W/IMG GUIDE  06/25/2019   RIGHT/LEFT HEART CATH AND CORONARY ANGIOGRAPHY N/A 07/28/2019   Procedure: RIGHT/LEFT HEART CATH AND CORONARY ANGIOGRAPHY;  Surgeon: CSherren Mocha MD;  [place];  Service:  Cardiovascular;  Laterality: N/A;   TEE WITHOUT CARDIOVERSION N/A 08/06/2019   Procedure: TRANSESOPHAGEAL ECHOCARDIOGRAM (TEE);  Surgeon: BGaye Pollack MD;  [place]  Service: Open Heart Surgery;  Laterality: N/A;   WISDOM TOOTH EXTRACTION     college age   W88FRACTURE SURGERY     grade school    Family History  Problem Relation Age of Onset   Osteoporosis Mother    Other Father        died in a hurricane    Social History   Socioeconomic History   Marital status: Single    Spouse name: Not on file   Number of children: Not on file   Years of education: 16   Highest education level: Bachelor's degree (e.g., BA, AB, BS)  Occupational History   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[employer]  Tobacco Use   Smoking status: Never   Smokeless tobacco: Never  Vaping Use   Vaping Use: Never used  Substance and Sexual Activity   Alcohol use: Yes    Comment: rarely   Drug use: Never   Sexual activity: Not on file  Other Topics Concern   Not on file  Social History Narrative   Not on file   Social Determinants of Health   Financial Resource Strain: Not on file  Food Insecurity: Not on file  Transportation Needs: Not on file  Physical Activity: Not on file  Stress: Not on  file  Social Connections: Not on file  Intimate Partner Violence: Not on file    Outpatient Medications Prior to Visit  Medication Sig Dispense Refill   acetaminophen (TYLENOL) 325 MG tablet Take 2 tablets (650 mg total) by mouth every 6 (six) hours as needed for mild pain.     aspirin EC 81 MG tablet Take 1 tablet (81 mg total) by mouth daily. Swallow whole. 30 tablet 11   levothyroxine (SYNTHROID) 75 MCG tablet Take 1 tablet (75 mcg total) by mouth daily. 90 tablet 0   Multiple Vitamin (MULTIVITAMIN) tablet Take 1 tablet by mouth daily.     sildenafil (VIAGRA) 100 MG tablet Take 1 tablet (100 mg total) by mouth daily as needed for erectile dysfunction. 10 tablet 6   amoxicillin (AMOXIL)  500 MG capsule Take 4 capsules by mouth 30-60 minutes before dental procedures (Patient not taking: Reported on 02/17/2021) 4 capsule 5   furosemide (LASIX) 40 MG tablet Take 1 tablet (40 mg total) by mouth as needed. (Patient not taking: Reported on 02/17/2021) 30 tablet 6   metFORMIN (GLUCOPHAGE) 500 MG tablet Take 1 tablet (500 mg total) by mouth daily with breakfast. (Patient not taking: Reported on 02/17/2021) 90 tablet 1   No facility-administered medications prior to visit.    No Known Allergies  ROS Review of Systems  Constitutional:  Positive for fatigue. Negative for activity change, appetite change, chills, diaphoresis, fever and unexpected weight change.  HENT: Negative.    Eyes: Negative.   Respiratory: Negative.    Cardiovascular: Negative.   Gastrointestinal: Negative.   Endocrine: Negative.   Genitourinary: Negative.   Musculoskeletal: Negative.   Skin: Negative.   Allergic/Immunologic: Negative.   Neurological: Negative.   Hematological: Negative.   Psychiatric/Behavioral: Negative.    All other systems reviewed and are negative.    Objective:    Physical Exam Constitutional:      General: He is not in acute distress.    Appearance: Normal appearance. He is normal weight. He is not ill-appearing, toxic-appearing or diaphoretic.  Cardiovascular:     Rate and Rhythm: Normal rate and regular rhythm.     Heart sounds: Normal heart sounds. No murmur heard.   No friction rub. No gallop.  Pulmonary:     Effort: Pulmonary effort is normal. No respiratory distress.     Breath sounds: Normal breath sounds. No stridor. No wheezing, rhonchi or rales.  Chest:     Chest wall: No tenderness.  Musculoskeletal:        General: Normal range of motion.     Right lower leg: Edema present.     Left lower leg: Edema present.  Neurological:     General: No focal deficit present.     Mental Status: He is alert and oriented to person, place, and time. Mental status is at  baseline.  Psychiatric:        Mood and Affect: Mood normal.        Behavior: Behavior normal.        Thought Content: Thought content normal.        Judgment: Judgment normal.    BP 124/84    Pulse 72    Temp 98.4 F (36.9 C)    Resp 16    Wt 283 lb 12.8 oz (128.7 kg)    SpO2 98%    BMI 38.49 kg/m  Wt Readings from Last 3 Encounters:  02/17/21 283 lb 12.8 oz (128.7 kg)  11/01/20 273 lb (123.8 kg)  07/19/20 255 lb (115.7 kg)     Health Maintenance Due  Topic Date Due   Pneumococcal Vaccine 2-68 Years old (1 - PCV) Never done   Zoster Vaccines- Shingrix (1 of 2) Never done   COVID-19 Vaccine (3 - Booster for Pfizer series) 12/08/2019    There are no preventive care reminders to display for this patient.  Lab Results  Component Value Date   TSH 6.87 (H) 11/01/2020   Lab Results  Component Value Date   WBC 6.7 04/18/2020   HGB 17.4 04/18/2020   HCT 51.9 (H) 04/18/2020   MCV 89 04/18/2020   PLT 176 08/12/2019   Lab Results  Component Value Date   NA 140 04/18/2020   K 5.0 04/18/2020   CO2 21 04/18/2020   GLUCOSE 108 (H) 04/18/2020   BUN 19 04/18/2020   CREATININE 0.83 04/18/2020   BILITOT 0.6 04/18/2020   ALKPHOS 64 04/18/2020   AST 26 04/18/2020   ALT 26 04/18/2020   PROT 7.4 04/18/2020   ALBUMIN 4.7 04/18/2020   CALCIUM 9.4 04/18/2020   ANIONGAP 10 08/15/2019   Lab Results  Component Value Date   CHOL 193 04/18/2020   Lab Results  Component Value Date   HDL 52 04/18/2020   Lab Results  Component Value Date   LDLCALC 113 (H) 04/18/2020   Lab Results  Component Value Date   TRIG 159 (H) 04/18/2020   Lab Results  Component Value Date   CHOLHDL 3.7 04/18/2020   Lab Results  Component Value Date   HGBA1C 5.7 (H) 04/18/2020      Assessment & Plan:   Problem List Items Addressed This Visit       Endocrine   Hypothyroidism   Relevant Orders   CBC with Differential/Platelet   Comprehensive metabolic panel   TSH   Vitamin D (25  hydroxy)   B12 and Folate Panel     Other   Hyperglycemia   Relevant Orders   CBC with Differential/Platelet   Comprehensive metabolic panel   Hemoglobin A1c   Lipid panel   S/P aortic valve replacement with bioprosthetic valve   Relevant Orders   CBC with Differential/Platelet   Comprehensive metabolic panel   Lipid panel   Other Visit Diagnoses     Chronic fatigue    -  Primary   Relevant Orders   CBC with Differential/Platelet   Comprehensive metabolic panel   TSH   Vitamin D (25 hydroxy)   B12 and Folate Panel   Prediabetes       Relevant Orders   CBC with Differential/Platelet   Comprehensive metabolic panel   Hemoglobin A1c   Chronic pain of right wrist       Relevant Orders   Ambulatory referral to Hand Surgery   CBC with Differential/Platelet   Comprehensive metabolic panel   Asymptomatic varicose veins of both lower extremities       Relevant Orders   Ambulatory referral to Vascular Surgery       No orders of the defined types were placed in this encounter.   Follow-up: Return in about 3 months (around 05/18/2021) for thyroid.   PLAN Labs collected. Will follow up with the patient as warranted. Refer to hand surg Refer to vascular Return in 3 mo Patient encouraged to call clinic with any questions, comments, or concerns.  Maximiano Coss, NP

## 2021-02-17 NOTE — Patient Instructions (Signed)
Mr. Pullin -   Doristine Devoid to see you  Let's check labs   I'll be in touch with results  See you in 3 mo - if labs looking perfect, we can push it to 6.  Thanks,  Denice Paradise

## 2021-02-20 ENCOUNTER — Other Ambulatory Visit: Payer: Self-pay | Admitting: Registered Nurse

## 2021-02-20 DIAGNOSIS — E785 Hyperlipidemia, unspecified: Secondary | ICD-10-CM

## 2021-02-20 DIAGNOSIS — E119 Type 2 diabetes mellitus without complications: Secondary | ICD-10-CM

## 2021-02-20 MED ORDER — METFORMIN HCL 500 MG PO TABS: 500.0000 mg | ORAL_TABLET | Freq: Two times a day (BID) | ORAL | 0 refills | Status: DC

## 2021-02-20 MED ORDER — ROSUVASTATIN CALCIUM 10 MG PO TABS: 10.0000 mg | ORAL_TABLET | Freq: Every day | ORAL | 0 refills | Status: DC

## 2021-02-22 ENCOUNTER — Telehealth: Payer: Self-pay | Admitting: *Deleted

## 2021-02-22 DIAGNOSIS — E039 Hypothyroidism, unspecified: Secondary | ICD-10-CM

## 2021-02-22 MED ORDER — LEVOTHYROXINE SODIUM 75 MCG PO TABS: 75.0000 ug | ORAL_TABLET | Freq: Every day | ORAL | 1 refills | Status: DC

## 2021-02-22 NOTE — Telephone Encounter (Signed)
Patient called stating that he needed a refill on his levothyroxine but was unsure if the dosage would be increased. I let him know that TSH level came back normal and best it has been per Maximiano Coss, NP. He asked if he should discontinue if level was now normal and I let him know that medication was not to be discontinued, it meant medication was working. He verbalized agreement and requested a 90 day supply be sent to pharmacy. I let him know I did this and to call his pharmacy to check if it was ready prior to going to pick up.

## 2021-05-02 ENCOUNTER — Ambulatory Visit: Payer: 59 | Admitting: Registered Nurse

## 2021-05-18 ENCOUNTER — Ambulatory Visit: Payer: 59 | Admitting: Registered Nurse

## 2021-07-27 ENCOUNTER — Encounter: Payer: Self-pay | Admitting: Registered Nurse

## 2021-07-27 ENCOUNTER — Ambulatory Visit (INDEPENDENT_AMBULATORY_CARE_PROVIDER_SITE_OTHER)
Admission: RE | Admit: 2021-07-27 | Discharge: 2021-07-27 | Disposition: A | Discharge status: Home / Self Care | Payer: 59 | Source: Ambulatory Visit | Attending: Registered Nurse | Admitting: Registered Nurse

## 2021-07-27 ENCOUNTER — Ambulatory Visit: Payer: 59 | Admitting: Registered Nurse

## 2021-07-27 VITALS — BP 134/86 | HR 69 | Temp 98.3°F | Resp 18 | Ht 72.0 in | Wt 286.6 lb

## 2021-07-27 DIAGNOSIS — R29898 Other symptoms and signs involving the musculoskeletal system: Secondary | ICD-10-CM

## 2021-07-27 NOTE — Patient Instructions (Addendum)
Mr. Steve Richards -   Doristine Devoid to see you  This is an interesting presentation.  Let's get xray of neck at [facility] [address] [facility]  I will refer to neuro  Call if things change or worsen in interim  Thanks,  Rich     If you have lab work done today you will be contacted with your lab results within the next 2 weeks.  If you have not heard from [place] then please contact us. The fastest way to get your results is to register for My Chart.   IF you received an x-ray today, you will receive an invoice from [facility]. Please contact [facility] at [phone] with questions or concerns regarding your invoice.   IF you received labwork today, you will receive an invoice from [place]. Please contact [facility] at [phone] with questions or concerns regarding your invoice.   Our billing staff will not be able to assist you with questions regarding bills from these companies.  You will be contacted with the lab results as soon as they are available. The fastest way to get your results is to activate your My Chart account. Instructions are located on the last page of this paperwork. If you have not heard from [place] regarding the results in 2 weeks, please contact this office.

## 2021-07-27 NOTE — Progress Notes (Signed)
Established Patient Office Visit  Subjective:  Patient ID: Ojani Berenson, male    DOB: 1965-05-15  Age: 56 y.o. MRN: [id number]  CC:  Chief Complaint  Patient presents with   Referral    Patient states he has been having problems with his hands for about 6 months and would like to have a specialist take a look.    HPI Roye Gustafson presents for referral  Left hand weakness ongoing around 6 mo. Stable, question of waxing and waning. Notes his thumb tends towards a flexed position at [facility] joint. Cannot actively flex wrist and fingers.  Full passive ROM. No sensory disturbance. Grip strength intact.  He does note a pinched nerve in neck around 6 months ago when this started. However, had a massage therapist colleague work on this with good effect.   Outpatient Medications Prior to Visit  Medication Sig Dispense Refill   acetaminophen (TYLENOL) 325 MG tablet Take 2 tablets (650 mg total) by mouth every 6 (six) hours as needed for mild pain.     aspirin EC 81 MG tablet Take 1 tablet (81 mg total) by mouth daily. Swallow whole. 30 tablet 11   levothyroxine (SYNTHROID) 75 MCG tablet Take 1 tablet (75 mcg total) by mouth daily. 90 tablet 1   metFORMIN (GLUCOPHAGE) 500 MG tablet Take 1 tablet (500 mg total) by mouth 2 (two) times daily with a meal. 180 tablet 0   Multiple Vitamin (MULTIVITAMIN) tablet Take 1 tablet by mouth daily.     rosuvastatin (CRESTOR) 10 MG tablet Take 1 tablet (10 mg total) by mouth daily. 90 tablet 0   sildenafil (VIAGRA) 100 MG tablet Take 1 tablet (100 mg total) by mouth daily as needed for erectile dysfunction. 10 tablet 6   No facility-administered medications prior to visit.    Review of Systems  Constitutional: Negative.   HENT: Negative.    Eyes: Negative.   Respiratory: Negative.    Cardiovascular: Negative.   Gastrointestinal: Negative.   Genitourinary: Negative.   Musculoskeletal: Negative.   Skin: Negative.   Neurological: Negative.    Psychiatric/Behavioral: Negative.    All other systems reviewed and are negative.    Objective:     BP 134/86   Pulse 69   Temp 98.3 F (36.8 C) (Temporal)   Resp 18   Ht 6' (1.829 m)   Wt 286 lb 9.6 oz (130 kg)   SpO2 98%   BMI 38.87 kg/m   Wt Readings from Last 3 Encounters:  07/27/21 286 lb 9.6 oz (130 kg)  02/17/21 283 lb 12.8 oz (128.7 kg)  11/01/20 273 lb (123.8 kg)   Physical Exam Constitutional:      General: He is not in acute distress.    Appearance: Normal appearance. He is normal weight. He is not ill-appearing, toxic-appearing or diaphoretic.  Cardiovascular:     Rate and Rhythm: Normal rate and regular rhythm.     Heart sounds: Normal heart sounds. No murmur heard.   No friction rub. No gallop.  Pulmonary:     Effort: Pulmonary effort is normal. No respiratory distress.     Breath sounds: Normal breath sounds. No stridor. No wheezing, rhonchi or rales.  Chest:     Chest wall: No tenderness.  Musculoskeletal:     Comments: Full passive ROM. No tendernesss.  Neurological:     General: No focal deficit present.     Mental Status: He is alert and oriented to person, place, and time. Mental status is  at baseline.     Cranial Nerves: No cranial nerve deficit.     Sensory: No sensory deficit.     Motor: Weakness (flexion in L wrist) present.     Coordination: Coordination normal.     Gait: Gait normal.  Psychiatric:        Mood and Affect: Mood normal.        Behavior: Behavior normal.        Thought Content: Thought content normal.        Judgment: Judgment normal.    No results found for any visits on 07/27/21.    The 10-year ASCVD risk score (Arnett DK, et al., 2019) is: 16%    Assessment & Plan:   Problem List Items Addressed This Visit   None Visit Diagnoses     LUE weakness    -  Primary   Relevant Orders   Ambulatory referral to Neurology   DG Cervical Spine Complete       No orders of the defined types were placed in this  encounter.   Return if symptoms worsen or fail to improve.   PLAN Unclear etiology. Will obtain xray neck  Refer to neuro - perhaps will need nerve conduction studies Patient encouraged to call clinic with any questions, comments, or concerns.   Maximiano Coss, NP

## 2021-08-02 ENCOUNTER — Encounter: Payer: Self-pay | Admitting: Neurology

## 2021-09-19 IMAGING — CT CT ANGIO CHEST
1 of 3 series · 1 of 24 positions shown · non-contrast
Comparison: No priors.

CLINICAL DATA: 53-year-old male with history of severe aortic
stenosis. Preprocedural study prior to potential transcatheter
aortic valve replacement (TAVR) procedure.

EXAM:
CT ANGIOGRAPHY CHEST, ABDOMEN AND PELVIS
TECHNIQUE: Non-contrast CT of the chest was initially obtained.

[Series 234: — · 0.21mm/px · 1 of 9 slices shown]
[im 5/9]
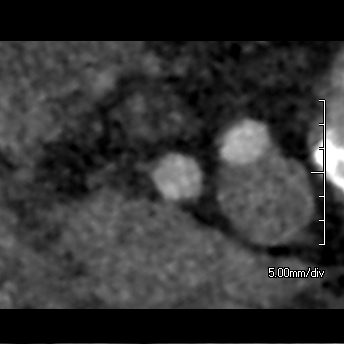

[1 of 24 positions shown; findings below may reference images not displayed]

Multidetector CT imaging through the chest, abdomen and pelvis was
performed using the standard protocol during bolus administration of
intravenous contrast. Multiplanar reconstructed images and MIPs were
obtained and reviewed to evaluate the vascular anatomy.

CONTRAST:  100mL OMNIPAQUE IOHEXOL 350 MG/ML SOLN
FINDINGS: CTA CHEST FINDINGS

Cardiovascular: Heart size is mildly enlarged. There is no
significant pericardial fluid, thickening or pericardial
calcification. Aortic atherosclerosis. No definite coronary artery
calcifications. Severe thickening calcification of the aortic valve.
Dilatation of the pulmonic trunk (3.6 cm in diameter).

Mediastinum/Lymph Nodes: No pathologically enlarged mediastinal or
hilar lymph nodes. Esophagus is unremarkable in appearance. No
axillary lymphadenopathy.

Lungs/Pleura: Patchy but diffuse ground-glass attenuation and mild
interlobular septal thickening noted throughout the lungs
bilaterally, suggesting a background of mild interstitial pulmonary
edema. Moderate bilateral pleural effusions lying dependently with
some associated passive subsegmental atelectasis in the lower lobes
of the lungs bilaterally. No definite suspicious appearing pulmonary
nodules or masses.

Musculoskeletal/Soft Tissues: Old healed fracture of the mid sternum
with mild posttraumatic deformity. There are no aggressive appearing
lytic or blastic lesions noted in the visualized portions of the
skeleton.

CTA ABDOMEN AND PELVIS FINDINGS

Hepatobiliary: Liver has a slightly shrunken appearance and nodular
contour, indicative of underlying cirrhosis. No discrete cystic or
solid hepatic lesions. No intra or extrahepatic biliary ductal
dilatation. Gallbladder is nearly completely decompressed, and
otherwise unremarkable in appearance.

Pancreas: No pancreatic mass. No pancreatic ductal dilatation. No
pancreatic or peripancreatic fluid collections or inflammatory
changes.

Spleen: Unremarkable.

Adrenals/Urinary Tract: Left kidney and bilateral adrenal glands are
normal in appearance. In the lower pole of the right kidney there is
an exophytic low-attenuation nonenhancing lesion measuring 8 cm in
diameter, compatible with a simple cyst. No hydroureteronephrosis.
Amorphous mass-like area of enhancement along the posterior wall of
the urinary bladder measuring approximately 6.4 x 2.0 x 2.5 cm
(axial image 214 of series 6 and sagittal image 111 of series 9).

Stomach/Bowel: Appearance of the stomach is normal. No pathologic
dilatation of small bowel or colon. A few scattered colonic
diverticulae are noted, without definite surrounding inflammatory
changes to suggest an acute diverticulitis (study is limited by
presence of small volume of ascites). Normal appendix.

Vascular/Lymphatic: Aortic atherosclerosis with vascular findings
and measurements pertinent to potential TAVR procedure, as detailed
below. No aneurysm or dissection noted in the abdominal or pelvic
vasculature. No lymphadenopathy noted in the abdomen or pelvis.

Reproductive: Prostate gland and seminal vesicles are unremarkable
in appearance.

Other: Small volume of ascites.  No pneumoperitoneum.

Musculoskeletal: There are no aggressive appearing lytic or blastic
lesions noted in the visualized portions of the skeleton. Mild
diffuse body wall edema.

VASCULAR MEASUREMENTS PERTINENT TO TAVR:

AORTA:

Minimal Aortic Wiameter-G7 x 16 mm

Severity of Aortic Calcification-mild

RIGHT PELVIS:

Right Common Iliac Artery -

Minimal 0iameter-T.O x 11.1 mm

Tortuosity - mild

Calcification-minimal

Right External Iliac Artery -

Minimal Eiameter-7.K x 9.1 mm

Tortuosity - mild

Calcification-none

Right Common Femoral Artery -

Minimal Niameter-0.B x 8.6 mm

Tortuosity - mild

Calcification-none

LEFT PELVIS:

Left Common Iliac Artery -

Minimal Liameter-LL.H x 9.9 mm

Tortuosity - mild

Calcification-minimal

Left External Iliac Artery -

Minimal Miameter-22.T x 8.8 mm

Tortuosity - mild

Calcification-none

Left Common Femoral Artery -

Minimal Niameter-K.L x 8.7 mm

Tortuosity-mild

Calcification-none

Review of the MIP images confirms the above findings.
IMPRESSION: 1. Vascular findings and measurements pertinent to potential TAVR
procedure, as detailed above.
2. Severe thickening calcification of the aortic valve, compatible
with reported clinical history of severe aortic stenosis.
3. Cardiomegaly with evidence of mild interstitial pulmonary edema
in the lungs and moderate bilateral pleural effusions; imaging
findings concerning for congestive heart failure.
4. Amorphous mass-like area of enhancement along the posterior wall
of the urinary bladder measuring approximately 6.4 x 2.0 x 2.5 cm.
Nonemergent Urologic consultation is strongly recommended in the
near future to better evaluate this finding, as the possibility of
an infiltrative bladder neoplasm is not excluded.
5. There is also dilatation of the pulmonic trunk (3.6 cm in
diameter), concerning for pulmonary arterial hypertension.
6. Morphologic changes in the liver suggesting early cirrhosis.
7. Small volume of ascites.
8. Mild diffuse body wall edema.
9. Additional incidental findings, as above.

These results will be called to the ordering clinician or
representative by the Radiologist Assistant, and communication
documented in the PACS or [REDACTED].

## 2021-09-25 ENCOUNTER — Encounter: Payer: Self-pay | Admitting: Cardiology

## 2021-09-25 ENCOUNTER — Ambulatory Visit: Payer: 59 | Admitting: Cardiology

## 2021-09-25 VITALS — BP 110/80 | HR 77 | Ht 72.0 in | Wt 295.0 lb

## 2021-09-25 DIAGNOSIS — Z952 Presence of prosthetic heart valve: Secondary | ICD-10-CM | POA: Diagnosis not present

## 2021-09-25 DIAGNOSIS — I5042 Chronic combined systolic (congestive) and diastolic (congestive) heart failure: Secondary | ICD-10-CM | POA: Diagnosis not present

## 2021-09-25 NOTE — Patient Instructions (Signed)
Medication Instructions:  The current medical regimen is effective;  continue present plan and medications.  *If you need a refill on your cardiac medications before your next appointment, please call your pharmacy*  Testing/Procedures: Your physician has requested that you have an echocardiogram. Echocardiography is a painless test that uses sound waves to create images of your heart. It provides your doctor with information about the size and shape of your heart and how well your heart's chambers and valves are working. This procedure takes approximately one hour. There are no restrictions for this procedure.   Follow-Up: At [facility], you and your health needs are our priority.  As part of our continuing mission to provide you with exceptional heart care, we have created designated Provider Care Teams.  These Care Teams include your primary Cardiologist (physician) and Advanced Practice Providers (APPs -  Physician Assistants and Nurse Practitioners) who all work together to provide you with the care you need, when you need it.  We recommend signing up for the patient portal called "MyChart".  Sign up information is provided on this After Visit Summary.  MyChart is used to connect with patients for Virtual Visits (Telemedicine).  Patients are able to view lab/test results, encounter notes, upcoming appointments, etc.  Non-urgent messages can be sent to your provider as well.   To learn more about what you can do with MyChart, go to [email or web address].    Your next appointment:   1 year(s)  The format for your next appointment:   In Person  Provider:   Candee Furbish, MD {    Important Information About Sugar

## 2021-09-25 NOTE — Progress Notes (Signed)
Cardiology Office Note:    Date:  09/25/2021   ID:  Steve Richards, DOB Mar 19, 1965, MRN [id number]  PCP:  Maximiano Coss, NP   [facility] Providers Cardiologist:  Candee Furbish, MD     Referring MD: Maximiano Coss, NP    History of Present Illness:    Steve Richards is a 56 y.o. male here for follow-up aortic valve replacement 25 mm pericardial tissue valve in 2021.  5K running of the balls. [place] as well. Ran 1/2 Marathon. Mother died , family in [place].   Overall has felt well, no chest pain, no shortness of breath.  He has fallen off of training regimen.  Has gained some more weight.  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/06/2019   Size 25 mm // Echocardiogram 8/21: EF 55-60, mod LVH, Gr 2 DD, normal RVSF, mod LAE, trivial MR, AVR w mean 13 mmHg and no PVL   Severe aortic stenosis    Splenomegaly 4/48/1856   Systolic ejection murmur 05/16/9700    Past Surgical History:  Procedure Laterality Date   AORTIC VALVE REPLACEMENT N/A 08/06/2019   Procedure: AORTIC VALVE REPLACEMENT (AVR) using Margaretha Sheffield Resilia 25 MM Aortic Valve.;  Surgeon: Gaye Pollack, MD;  Location: [facility] OR;  Service: Open Heart Surgery;  Laterality: N/A;   CARDIAC CATHETERIZATION     EXPLORATION POST OPERATIVE OPEN HEART N/A 08/06/2019   Procedure: EXPLORATION POST OPERATIVE OPEN HEART;  Surgeon: Gaye Pollack, MD;  Location: [place];  Service: Open Heart Surgery;  Laterality: N/A;   IR THORACENTESIS ASP PLEURAL SPACE W/IMG GUIDE   06/25/2019   RIGHT/LEFT HEART CATH AND CORONARY ANGIOGRAPHY N/A 07/28/2019   Procedure: RIGHT/LEFT HEART CATH AND CORONARY ANGIOGRAPHY;  Surgeon: Sherren Mocha, MD;  Location: [place] [facility];  Service: Cardiovascular;  Laterality: N/A;   TEE WITHOUT CARDIOVERSION N/A 08/06/2019   Procedure: TRANSESOPHAGEAL ECHOCARDIOGRAM (TEE);  Surgeon: Gaye Pollack, MD;  Location: [place];  Service: Open Heart Surgery;  Laterality: N/A;   WISDOM TOOTH EXTRACTION     college age   WRIST FRACTURE SURGERY     grade school    Current Medications: Current Meds  Medication Sig   acetaminophen (TYLENOL) 325 MG tablet Take 2 tablets (650 mg total) by mouth every 6 (six) hours as needed for mild pain.   aspirin EC 81 MG tablet Take 1 tablet (81 mg total) by mouth daily. Swallow whole.   Multiple Vitamin (MULTIVITAMIN) tablet Take 1 tablet by mouth daily.   sildenafil (VIAGRA) 100 MG tablet Take 1 tablet (100 mg total) by mouth daily as needed for erectile dysfunction.     Allergies:   Patient has no known allergies.   Social History   Socioeconomic History   Marital status: Single    Spouse name: Not on file   Number of children: Not on file   Years of education: 16   Highest education level: Bachelor's degree (e.g., BA, AB, BS)  Occupational History   Occupation: massage  therapist at [employer]  Tobacco Use   Smoking status: Never   Smokeless tobacco: Never  Vaping Use   Vaping Use: Never used  Substance and Sexual Activity   Alcohol use: Yes    Comment: rarely   Drug use: Never   Sexual activity: Not on file  Other Topics Concern   Not on file  Social History Narrative   Not on file   Social Determinants of Health   Financial Resource Strain: Not on file  Food Insecurity: Not on file  Transportation Needs: Not on file  Physical Activity: Not on file  Stress: Not on file  Social Connections: Not on file     Family History: The patient's family history includes Osteoporosis  in his mother; Other in his father.  ROS:   Please see the history of present illness.     All other systems reviewed and are negative.  EKGs/Labs/Other Studies Reviewed:    The following studies were reviewed today:   ECHO 2021:   1. Left ventricular ejection fraction, by estimation, is 55 to 60%.   No CAD  EKG:  EKG is  ordered today.  The ekg ordered today demonstrates sinus rhythm 77 with nonspecific ST-T wave changes, no specific change from prior  Recent Labs: 02/17/2021: ALT 24; BUN 13; Creatinine, Ser 0.80; Hemoglobin 17.2; Platelets 151.0; Potassium 4.2; Sodium 138; TSH 4.77  Recent Lipid Panel    Component Value Date/Time   CHOL 250 (H) 02/17/2021 1341   CHOL 193 04/18/2020 1351   TRIG 300.0 (H) 02/17/2021 1341   HDL 44.60 02/17/2021 1341   HDL 52 04/18/2020 1351   CHOLHDL 6 02/17/2021 1341   VLDL 60.0 (H) 02/17/2021 1341   LDLCALC 113 (H) 04/18/2020 1351   LDLDIRECT 132.0 02/17/2021 1341     Risk Assessment/Calculations:              Physical Exam:    VS:  BP 110/80 (BP Location: Left Arm, Patient Position: Sitting, Cuff Size: Normal)   Pulse 77   Ht 6' (1.829 m)   Wt 295 lb (133.8 kg)   BMI 40.01 kg/m     Wt Readings from Last 3 Encounters:  09/25/21 295 lb (133.8 kg)  07/27/21 286 lb 9.6 oz (130 kg)  02/17/21 283 lb 12.8 oz (128.7 kg)     GEN:  Well nourished, well developed in no acute distress HEENT: Normal NECK: No JVD; No carotid bruits LYMPHATICS: No lymphadenopathy CARDIAC: RRR, 2/6 systolic murmurs, no rubs, gallops RESPIRATORY:  Clear to auscultation without rales, wheezing or rhonchi  ABDOMEN: Soft, non-tender, non-distended MUSCULOSKELETAL:  No edema; No deformity  SKIN: Warm and dry NEUROLOGIC:  Alert and oriented x 3 PSYCHIATRIC:  Normal affect   ASSESSMENT:    1. Chronic combined systolic and diastolic CHF (congestive heart failure) ([place])   2. S/P AVR (aortic valve replacement)    PLAN:    In order of problems  listed above:  Severe aortic stenosis status post aortic valve replacement 25 mm Dr. Cyndia Bent June 2021 - Excellent recovery.  Has run a half marathon.  Since his mother's death he has fallen off the training regimen. - Continue with dental prophylaxis. -We will check an echocardiogram since it has been 2 years.  Heart murmur appreciated.   Previous cardiomyopathy - Excellent improvement post valve replacement.  Echocardiogram as above.  We will check an echocardiogram since it has been 2 years.   Morbid obesity - Continue to work on [employer]  loss.  Training regimen.      Medication Adjustments/Labs and Tests Ordered: Current medicines are reviewed at length with the patient today.  Concerns regarding medicines are outlined above.  Orders Placed This Encounter  Procedures   EKG 12-Lead   ECHOCARDIOGRAM COMPLETE   No orders of the defined types were placed in this encounter.   Patient Instructions  Medication Instructions:  The current medical regimen is effective;  continue present plan and medications.  *If you need a refill on your cardiac medications before your next appointment, please call your pharmacy*  Testing/Procedures: Your physician has requested that you have an echocardiogram. Echocardiography is a painless test that uses sound waves to create images of your heart. It provides your doctor with information about the size and shape of your heart and how well your heart's chambers and valves are working. This procedure takes approximately one hour. There are no restrictions for this procedure.   Follow-Up: At [facility], you and your health needs are our priority.  As part of our continuing mission to provide you with exceptional heart care, we have created designated Provider Care Teams.  These Care Teams include your primary Cardiologist (physician) and Advanced Practice Providers (APPs -  Physician Assistants and Nurse Practitioners) who all work together to provide  you with the care you need, when you need it.  We recommend signing up for the patient portal called "MyChart".  Sign up information is provided on this After Visit Summary.  MyChart is used to connect with patients for Virtual Visits (Telemedicine).  Patients are able to view lab/test results, encounter notes, upcoming appointments, etc.  Non-urgent messages can be sent to your provider as well.   To learn more about what you can do with MyChart, go to [email or web address].    Your next appointment:   1 year(s)  The format for your next appointment:   In Person  Provider:   Candee Furbish, MD {    Important Information About Sugar         Signed, Candee Furbish, MD  09/25/2021 5:07 PM    [place]

## 2021-10-12 ENCOUNTER — Ambulatory Visit (HOSPITAL_COMMUNITY): Payer: 59 | Attending: Internal Medicine

## 2021-10-12 DIAGNOSIS — I5042 Chronic combined systolic (congestive) and diastolic (congestive) heart failure: Secondary | ICD-10-CM | POA: Diagnosis not present

## 2021-10-12 DIAGNOSIS — Z952 Presence of prosthetic heart valve: Secondary | ICD-10-CM | POA: Diagnosis not present

## 2021-10-12 LAB — ECHOCARDIOGRAM COMPLETE
AR max vel: 1.08 cm2
AV Area VTI: 1.27 cm2
AV Area mean vel: 1.08 cm2
AV Mean grad: 14 mmHg
AV Peak grad: 23.4 mmHg
Ao pk vel: 2.42 m/s
Area-P 1/2: 2.59 cm2
S' Lateral: 3.1 cm

## 2021-10-12 MED ORDER — PERFLUTREN LIPID MICROSPHERE
1.0000 mL | INTRAVENOUS | Status: AC | PRN
  Administered 2021-10-12: 1 mL via INTRAVENOUS

## 2021-10-31 NOTE — Progress Notes (Unsigned)
Initial neurology clinic note  SERVICE DATE: 11/02/21 SERVICE TIME: 2:30 pm  Reason for Evaluation: Consultation requested by Maximiano Coss, NP for an opinion regarding left upper extremity weakness. My final recommendations will be communicated back to the requesting physician by way of shared medical record or letter to requesting physician via [place] mail.  HPI: This is Mr. Steve Richards, a 56 y.o. right-handed male with a medical history of DM2, hypothyroidism, HLD, aortic stenosis s/p AVR (2021, not on [facility]), ?cirrhosis who presents to neurology clinic with the chief complaint of left hand weakness. The patient is alone today.  Around Christmas of 2022, patient had a new car. He had acute, sharp localized pain near the spine of the scapula on the left. He is not sure if he woke up with the pain or not. He had the pain for 2-6 weeks. He rates the pain to about 6/10. After 3-4 months, he started noticing difficulty extending his left wrist. Around 06/2021 patient had weakness in the left hand. He is not sure if it is improving, but does not seem to be getting worse. Patient is concerned his arm is starting to atrophy.   He does not remember a preceding viral illness. He had no vaccines since the fall of 2022. He does not remember sleeping or waking up in an awkward position.  He had no symptoms in other limbs. He had a previous pinched nerve after falling onto his left shoulder around 8 years. He denies prior wrist drop or foot drop.  Patient saw his PCP, Dr. Orland Mustard on 07/27/21 for left hand weakness that had been present for about 6 months after a "pinched nerve in the neck." He had difficulty flexing fingers or wrist. Cervical spine xray showed mild to moderate left foraminal narrowing at C3/4, C5/6, and C6/7. Patient was referred to neurology for further evaluation.  He denies any constitutional symptoms like fever, night sweats, anorexia or unintentional weight loss.  EtOH use: Rare, 1 drink   Restrictive diet? No Family history of neuropathy/myopathy/NM disease? No  Patient has not had an EMG and not had any medications for symptoms.  Patient is a massage therapist.   MEDICATIONS:  Outpatient Encounter Medications as of 11/02/2021  Medication Sig   acetaminophen (TYLENOL) 325 MG tablet Take 2 tablets (650 mg total) by mouth every 6 (six) hours as needed for mild pain.   aspirin EC 81 MG tablet Take 1 tablet (81 mg total) by mouth daily. Swallow whole.   Multiple Vitamin (MULTIVITAMIN) tablet Take 1 tablet by mouth daily.   sildenafil (VIAGRA) 100 MG tablet Take 1 tablet (100 mg total) by mouth daily as needed for erectile dysfunction.   No facility-administered encounter medications on file as of 11/02/2021.    PAST MEDICAL HISTORY: Past Medical History:  Diagnosis Date   Ascites 07/15/2019   Cellulitis and abscess of left lower extremity 07/21/2019   Chronic combined systolic (congestive) and diastolic (congestive) heart failure (HCC)    Cirrhosis of liver with ascites [facility])    Critical aortic valve stenosis    Hepatic cirrhosis ([place]) 07/15/2019   Hyperglycemia 07/21/2019   Left kidney mass 07/15/2019   Morbid obesity (HCC)    Pleural effusion    Pleural effusion on right 07/15/2019   S/P aortic valve replacement with bioprosthetic valve 08/06/2019   Size 25 mm // Echocardiogram 8/21: EF 55-60, mod LVH, Gr 2 DD, normal RVSF, mod LAE, trivial MR, AVR w mean 13 mmHg and no PVL  Severe aortic stenosis    Splenomegaly 3/53/6144   Systolic ejection murmur 05/18/4006    PAST SURGICAL HISTORY: Past Surgical History:  Procedure Laterality Date   AORTIC VALVE REPLACEMENT N/A 08/06/2019   Procedure: AORTIC VALVE REPLACEMENT (AVR) using Margaretha Sheffield Resilia 25 MM Aortic Valve.;  Surgeon: Gaye Pollack, MD;  Location: [facility] OR;  Service: Open Heart Surgery;  Laterality: N/A;   CARDIAC CATHETERIZATION     EXPLORATION POST OPERATIVE OPEN HEART N/A 08/06/2019   Procedure:  EXPLORATION POST OPERATIVE OPEN HEART;  Surgeon: Gaye Pollack, MD;  Location: [place];  Service: Open Heart Surgery;  Laterality: N/A;   IR THORACENTESIS ASP PLEURAL SPACE W/IMG GUIDE  06/25/2019   RIGHT/LEFT HEART CATH AND CORONARY ANGIOGRAPHY N/A 07/28/2019   Procedure: RIGHT/LEFT HEART CATH AND CORONARY ANGIOGRAPHY;  Surgeon: Sherren Mocha, MD;  Location: [place] [facility];  Service: Cardiovascular;  Laterality: N/A;   TEE WITHOUT CARDIOVERSION N/A 08/06/2019   Procedure: TRANSESOPHAGEAL ECHOCARDIOGRAM (TEE);  Surgeon: Gaye Pollack, MD;  Location: [place];  Service: Open Heart Surgery;  Laterality: N/A;   WISDOM TOOTH EXTRACTION     college age   27 FRACTURE SURGERY     grade school    ALLERGIES: No Known Allergies  FAMILY HISTORY: Family History  Problem Relation Age of Onset   Osteoporosis Mother    Other Father        died in a hurricane    SOCIAL HISTORY: Social History   Tobacco Use   Smoking status: Never   Smokeless tobacco: Never  Vaping Use   Vaping Use: Never used  Substance Use Topics   Alcohol use: Yes    Comment: rarely   Drug use: Never   Social History   Social History Narrative   Right handed   Caffeine rare   Live in a two story apartment     OBJECTIVE: PHYSICAL EXAM: BP (!) 142/92   Pulse 80   Ht 6' (1.829 m)   Wt 292 lb (132.5 kg)   SpO2 92%   BMI 39.60 kg/m   General: General appearance: Awake and alert. No distress. Cooperative with exam.  Skin: Discoloration of skin on bilateral lower extremities below the knees HEENT: Atraumatic. Anicteric. Lungs: Non-labored breathing on room air  Psych: Affect appropriate.  Neurological: Mental Status: Alert. Speech fluent. No pseudobulbar affect Cranial Nerves: CNII: No RAPD. Visual fields intact. CNIII, IV, VI: PERRL. No nystagmus. EOMI. CN V: Facial sensation intact bilaterally to fine touch. CN VII: Facial muscles symmetric and strong. No ptosis at rest. CN VIII: Hears finger  rub well bilaterally. CN IX: No hypophonia. CN X: Palate elevates symmetrically. CN XI: Full strength shoulder shrug bilaterally. CN XII: Tongue protrusion full and midline. No atrophy or fasciculations. No significant dysarthria Motor: Tone is normal. No fasciculations in extremities. Atrophy of left forearm, and ?atrophy of FDI, ADM.  Individual muscle group testing (MRC grade out of 5):  Movement     Neck flexion 5    Neck extension 5     Right Left   Shoulder abduction 5 5   Shoulder adduction 5 5   Shoulder ext rotation 5 5   Shoulder int rotation 5 5   Elbow flexion 5 5   Elbow extension 5 5   Pronation 5 5   Brachioradialis 5 5   Wrist extension 5 4+ Weak to ulnar side  Wrist flexion 5 5   Finger abduction - FDI 5 4+   Finger  abduction - ADM 5 4   Finger extension 5 1   Finger distal flexion - 2/3 5 4    Finger distal flexion - 4/5 5 3    Thumb flexion - FPL 5 4+   Thumb abduction - APB 5 4-    Hip flexion 5 5   Knee extension 5 5   Knee flexion 5 5   Dorsiflexion 5 5   Plantarflexion 5 5     Reflexes:  Right Left   Bicep 2+ 2+   Tricep 2+ 0   BrRad 2+ 2+   Knee 2+ 2+ Cross adductors bilaterally  Ankle 1+ 1+    Pathological Reflexes: Hoffman: absent bilaterally Troemner: absent bilaterally  Sensation: intact to light touch and pin prick in all extremities Coordination: Intact finger-to- nose-finger bilaterally. Romberg negative. Gait: Able to rise from chair with arms crossed unassisted. Normal, narrow-based gait.  Lab and Test Review: Internal labs: 02/17/21: B12: 885 Folate: >23.4 Vit D low at 23.61 TSH: 4.77 HbA1c 6.6 CBC and CMP unremarkable  Cervical spine xray (07/27/21): FINDINGS: No acute fracture or malalignment identified in the cervical spine. Moderate intervertebral disc space narrowing at C5-C6 and C6-C7 with small dorsal endplate osteophytes. Bilateral facet arthropathy, worse on the left. Mild-to-moderate left neural foraminal  narrowing at C3-C4, C5-C6 and C6-C7. No prevertebral soft tissue swelling.   IMPRESSION: Degenerative changes of the cervical spine as described.  ASSESSMENT: Steve Richards is a 56 y.o. male who presents for evaluation of left arm weakness. He has a relevant medical history of DM2, hypothyroidism, HLD, aortic stenosis s/p AVR (2021, not on [facility]), ?cirrhosis. His neurological examination is pertinent for weakness of finger extensors, flexors, abductors, and opposition. Triceps reflex was asymmetric (absent on left). Available diagnostic data is significant for cervical xray showing mild to moderate left foraminal narrowing from C3-4 to C6-7.   His examination points to a C8 (?C7) distribution. Cervical radiculopathy is felt to be less likely though due to the absence of neck pain and radiation into arm. The pain followed by weakness and atrophy is more consistent with neuralgic amyotrophy, which may affect the brachial plexus or nerve roots.  PLAN: -Blood work: CBC, CMP, TSH -EMG: LUE (BP protocol) -Occupational therapy for left hand weakness -Supplement Vit D, 1000 IU daily  -Return to clinic in 3 months  The impression above as well as the plan as outlined below were extensively discussed with the patient who voiced understanding. All questions were answered to their satisfaction.  When available, results of the above investigations and possible further recommendations will be communicated to the patient via telephone/MyChart. Patient to call office if not contacted after expected testing turnaround time.   Total time spent reviewing records, interview, history/exam, documentation, and coordination of care on day of encounter:  70 min   Thank you for allowing me to participate in patient's care.  If I can answer any additional questions, I would be pleased to do so.  Kai Levins, MD   CC: Maximiano Coss, NP [place] [id number]  CC: Referring provider: Maximiano Coss, NP [address],  [place] [id number]

## 2021-11-02 ENCOUNTER — Encounter: Payer: Self-pay | Admitting: Neurology

## 2021-11-02 ENCOUNTER — Other Ambulatory Visit (INDEPENDENT_AMBULATORY_CARE_PROVIDER_SITE_OTHER): Payer: 59

## 2021-11-02 ENCOUNTER — Ambulatory Visit: Payer: 59 | Admitting: Neurology

## 2021-11-02 VITALS — BP 142/92 | HR 80 | Ht 72.0 in | Wt 292.0 lb

## 2021-11-02 DIAGNOSIS — M79602 Pain in left arm: Secondary | ICD-10-CM

## 2021-11-02 DIAGNOSIS — G545 Neuralgic amyotrophy: Secondary | ICD-10-CM

## 2021-11-02 DIAGNOSIS — E559 Vitamin D deficiency, unspecified: Secondary | ICD-10-CM | POA: Diagnosis not present

## 2021-11-02 DIAGNOSIS — R29898 Other symptoms and signs involving the musculoskeletal system: Secondary | ICD-10-CM

## 2021-11-02 NOTE — Patient Instructions (Signed)
I saw you today for left hand weakness. 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 I would like to investigate with the following: -Lab work today -EMG of your left arm (muscle and nerve test)  I am referring you to occupational therapy for your left hand weakness.  I also noticed your vitamin D level was low. You should supplement with 1000 international units daily. This can be bought at the drug store or online.  I will be in touch when I have your results. Please let me know if you have any questions or concerns in the meantime.   Follow up in 3 months or sooner if needed.  The physicians and staff at [facility] are committed to providing excellent care. You may receive a survey requesting feedback about your experience at our office. We strive to receive "very good" responses to the survey questions. If you feel that your experience would prevent you from giving the office a "very good " response, please contact our office to try to remedy the situation. We may be reached at [phone]. Thank you for taking the time out of your busy day to complete the survey.  Kai Levins, MD  ELECTROMYOGRAM AND NERVE CONDUCTION STUDIES (EMG/NCS) INSTRUCTIONS  How to Prepare The neurologist conducting the EMG will need to know if you have certain medical conditions. Tell the neurologist and other EMG lab personnel if you: Have a pacemaker or any other electrical medical device Take blood-thinning medications Have hemophilia, a blood-clotting disorder that causes prolonged bleeding Bathing Take a shower or bath shortly before your exam in order to remove oils from your skin. Don't apply lotions or creams before the exam.  What to Expect You'll likely be asked to change into a hospital gown for the procedure and lie down on an examination table. The following explanations can help you understand what will happen during the exam.   Electrodes. The neurologist or a technician places surface electrodes at various locations on your skin depending on where you're experiencing symptoms. Or the neurologist may insert needle electrodes at different sites depending on your symptoms.  Sensations. The electrodes will at times transmit a tiny electrical current that you may feel as a twinge or spasm. The needle electrode may cause discomfort or pain that usually ends shortly after the needle is removed. If you are concerned about discomfort or pain, you may want to talk to the neurologist about taking a short break during the exam.  Instructions. During the needle EMG, the neurologist will assess whether there is any spontaneous electrical activity when the muscle is at rest - activity that isn't present in healthy muscle tissue - and the degree of activity when you slightly contract the muscle.  He or she will give you instructions on resting and contracting a muscle at appropriate times. Depending on what muscles and nerves the neurologist is examining, he or she may ask you to change positions during the exam.  After your EMG You may experience some temporary, minor bruising where the needle electrode was inserted into your muscle. This bruising should fade within several days. If it persists, contact your primary care doctor.

## 2021-11-03 ENCOUNTER — Encounter: Payer: Self-pay | Admitting: Neurology

## 2021-11-03 LAB — CBC
HCT: 51.3 % (ref 39.0–52.0)
Hemoglobin: 16.9 g/dL (ref 13.0–17.0)
MCHC: 33 g/dL (ref 30.0–36.0)
MCV: 91.2 fl (ref 78.0–100.0)
Platelets: 169 10*3/uL (ref 150.0–400.0)
RBC: 5.62 Mil/uL (ref 4.22–5.81)
RDW: 13.8 % (ref 11.5–15.5)
WBC: 7.5 10*3/uL (ref 4.0–10.5)

## 2021-11-03 LAB — COMPREHENSIVE METABOLIC PANEL
ALT: 32 U/L (ref 0–53)
AST: 26 U/L (ref 0–37)
Albumin: 4.5 g/dL (ref 3.5–5.2)
Alkaline Phosphatase: 49 U/L (ref 39–117)
BUN: 11 mg/dL (ref 6–23)
CO2: 26 mEq/L (ref 19–32)
Calcium: 9.3 mg/dL (ref 8.4–10.5)
Chloride: 102 mEq/L (ref 96–112)
Creatinine, Ser: 0.84 mg/dL (ref 0.40–1.50)
GFR: 97.69 mL/min (ref >60.00)
Glucose, Bld: 101 mg/dL — ABNORMAL HIGH (ref 70–99)
Potassium: 4.4 mEq/L (ref 3.5–5.1)
Sodium: 138 mEq/L (ref 135–145)
Total Bilirubin: 0.7 mg/dL (ref 0.2–1.2)
Total Protein: 7.7 g/dL (ref 6.0–8.3)

## 2021-11-03 LAB — TSH: TSH: 16.05 u[IU]/mL — ABNORMAL HIGH (ref 0.35–5.50)

## 2021-11-16 ENCOUNTER — Ambulatory Visit (INDEPENDENT_AMBULATORY_CARE_PROVIDER_SITE_OTHER): Payer: Commercial Managed Care - HMO | Admitting: Family Medicine

## 2021-11-16 ENCOUNTER — Encounter: Payer: Self-pay | Admitting: Family Medicine

## 2021-11-16 ENCOUNTER — Other Ambulatory Visit: Payer: Self-pay | Admitting: Family Medicine

## 2021-11-16 VITALS — BP 134/88 | HR 73 | Temp 97.8°F | Wt 293.0 lb

## 2021-11-16 DIAGNOSIS — E1169 Type 2 diabetes mellitus with other specified complication: Secondary | ICD-10-CM | POA: Diagnosis not present

## 2021-11-16 DIAGNOSIS — E781 Pure hyperglyceridemia: Secondary | ICD-10-CM

## 2021-11-16 DIAGNOSIS — E039 Hypothyroidism, unspecified: Secondary | ICD-10-CM

## 2021-11-16 DIAGNOSIS — K7460 Unspecified cirrhosis of liver: Secondary | ICD-10-CM | POA: Diagnosis not present

## 2021-11-16 DIAGNOSIS — E559 Vitamin D deficiency, unspecified: Secondary | ICD-10-CM | POA: Diagnosis not present

## 2021-11-16 DIAGNOSIS — R7309 Other abnormal glucose: Secondary | ICD-10-CM | POA: Diagnosis not present

## 2021-11-16 DIAGNOSIS — E785 Hyperlipidemia, unspecified: Secondary | ICD-10-CM

## 2021-11-16 MED ORDER — LEVOTHYROXINE SODIUM 50 MCG PO TABS: 50.0000 ug | ORAL_TABLET | Freq: Every day | ORAL | 0 refills | Status: DC

## 2021-11-16 NOTE — Patient Instructions (Signed)
Please go downstairs for labs before you leave today.  I recommend starting back on levothyroxine 50 mcg once daily in the morning on an empty stomach.  We will be in touch with your lab results.  Follow-up in 4 weeks  Mediterranean Diet A Mediterranean diet refers to food and lifestyle choices that are based on the traditions of countries located on the [employer]. It focuses on eating more fruits, vegetables, whole grains, beans, nuts, seeds, and heart-healthy fats, and eating less dairy, meat, eggs, and processed foods with added sugar, salt, and fat. This way of eating has been shown to help prevent certain conditions and improve outcomes for people who have chronic diseases, like kidney disease and heart disease. What are tips for following this plan? Reading food labels Check the serving size of packaged foods. For foods such as rice and pasta, the serving size refers to the amount of cooked product, not dry. Check the total fat in packaged foods. Avoid foods that have saturated fat or trans fats. Check the ingredient list for added sugars, such as corn syrup. Shopping  Buy a variety of foods that offer a balanced diet, including: Fresh fruits and vegetables (produce). Grains, beans, nuts, and seeds. Some of these may be available in unpackaged forms or large amounts (in bulk). Fresh seafood. Poultry and eggs. Low-fat dairy products. Buy whole ingredients instead of prepackaged foods. Buy fresh fruits and vegetables in-season from local farmers markets. Buy plain frozen fruits and vegetables. If you do not have access to quality fresh seafood, buy precooked frozen shrimp or canned fish, such as tuna, salmon, or sardines. Stock your pantry so you always have certain foods on hand, such as olive oil, canned tuna, canned tomatoes, rice, pasta, and beans. Cooking Cook foods with extra-virgin olive oil instead of using butter or other vegetable oils. Have meat as a side dish,  and have vegetables or grains as your main dish. This means having meat in small portions or adding small amounts of meat to foods like pasta or stew. Use beans or vegetables instead of meat in common dishes like chili or lasagna. Experiment with different cooking methods. Try roasting, broiling, steaming, and sauting vegetables. Add frozen vegetables to soups, stews, pasta, or rice. Add nuts or seeds for added healthy fats and plant protein at each meal. You can add these to yogurt, salads, or vegetable dishes. Marinate fish or vegetables using olive oil, lemon juice, garlic, and fresh herbs. Meal planning Plan to eat one vegetarian meal one day each week. Try to work up to two vegetarian meals, if possible. Eat seafood two or more times a week. Have healthy snacks readily available, such as: Vegetable sticks with hummus. Greek yogurt. Fruit and nut trail mix. Eat balanced meals throughout the week. This includes: Fruit: 2-3 servings a day. Vegetables: 4-5 servings a day. Low-fat dairy: 2 servings a day. Fish, poultry, or lean meat: 1 serving a day. Beans and legumes: 2 or more servings a week. Nuts and seeds: 1-2 servings a day. Whole grains: 6-8 servings a day. Extra-virgin olive oil: 3-4 servings a day. Limit red meat and sweets to only a few servings a month. Lifestyle  Cook and eat meals together with your family, when possible. Drink enough fluid to keep your urine pale yellow. Be physically active every day. This includes: Aerobic exercise like running or swimming. Leisure activities like gardening, walking, or housework. Get 7-8 hours of sleep each night. If recommended by your health care provider,  drink red wine in moderation. This means 1 glass a day for nonpregnant women and 2 glasses a day for men. A glass of wine equals 5 oz (150 mL). What foods should I eat? Fruits Apples. Apricots. Avocado. Berries. Bananas. Cherries. Dates. Figs. Grapes. Lemons. Melon. Oranges.  Peaches. Plums. Pomegranate. Vegetables Artichokes. Beets. Broccoli. Cabbage. Carrots. Eggplant. Green beans. Chard. Kale. Spinach. Onions. Leeks. Peas. Squash. Tomatoes. Peppers. Radishes. Grains Whole-grain pasta. Brown rice. Bulgur wheat. Polenta. Couscous. Whole-wheat bread. [place]. Meats and other proteins Beans. Almonds. Sunflower seeds. Pine nuts. Peanuts. [place]. Salmon. Scallops. Shrimp. [place]. Tilapia. Clams. Oysters. Eggs. Poultry without skin. Dairy Low-fat milk. Cheese. Greek yogurt. Fats and oils Extra-virgin olive oil. Avocado oil. Grapeseed oil. Beverages Water. Red wine. Herbal tea. Sweets and desserts Greek yogurt with honey. Baked apples. Poached pears. Trail mix. Seasonings and condiments Basil. Cilantro. Coriander. Cumin. Mint. Parsley. Sage. Rosemary. Tarragon. Garlic. Oregano. Thyme. Pepper. Balsamic vinegar. Tahini. Hummus. Tomato sauce. Olives. Mushrooms. The items listed above may not be a complete list of foods and beverages you can eat. Contact a dietitian for more information. What foods should I limit? This is a list of foods that should be eaten rarely or only on special occasions. Fruits Fruit canned in syrup. Vegetables Deep-fried potatoes (french fries). Grains Prepackaged pasta or rice dishes. Prepackaged cereal with added sugar. Prepackaged snacks with added sugar. Meats and other proteins Beef. Pork. Lamb. Poultry with skin. Hot dogs. [place] Salines. Dairy Ice cream. Sour cream. Whole milk. Fats and oils Butter. Canola oil. Vegetable oil. Beef fat (tallow). Lard. Beverages Juice. Sugar-sweetened soft drinks. Beer. Liquor and spirits. Sweets and desserts Cookies. Cakes. Pies. Candy. Seasonings and condiments Mayonnaise. Pre-made sauces and marinades. The items listed above may not be a complete list of foods and beverages you should limit. Contact a dietitian for more information. Summary The Mediterranean diet includes both food and lifestyle  choices. Eat a variety of fresh fruits and vegetables, beans, nuts, seeds, and whole grains. Limit the amount of red meat and sweets that you eat. If recommended by your health care provider, drink red wine in moderation. This means 1 glass a day for nonpregnant women and 2 glasses a day for men. A glass of wine equals 5 oz (150 mL). This information is not intended to replace advice given to you by your health care provider. Make sure you discuss any questions you have with your health care provider. Document Revised: 03/27/2019 Document Reviewed: 01/22/2019 Elsevier Patient Education  [place].

## 2021-11-16 NOTE — Progress Notes (Unsigned)
New Patient Office Visit  Subjective    Patient ID: Steve Richards, male    DOB: Oct 13, 1965  Age: 56 y.o. MRN: [id number]  CC:  Chief Complaint  Patient presents with   Establish Care    No concerns, would just like to catch up    HPI Steve Richards presents to establish care  He has been under the care of cardiology and neurology   Hx of aortic heart valve disease and had open heart surgery for repair.   Last Hgb A1c 6.6% and is not currently taking medication for DM. He has taken metformin in the past.  He is not on a statin or BP medication.   Has been off of his levothyroxine for the past 5-6 months. States he wanted to have a clean slate and see if he needs the medication. TSH was 16.05 2 weeks ago. He is willing to start back on levothyroxine.  Hx of elevated triglycerides and elevated LDL.   Cirrhosis hx. Normal LFTs 2 weeks ago.   Vitamin D def- 2,000 IUs x the past 2 weeks   Mother passed away over a year ago and this affected his overall health.  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     Outpatient Encounter Medications as of 11/16/2021  Medication Sig   acetaminophen (TYLENOL) 325 MG tablet Take 2 tablets (650 mg total) by mouth every 6 (six) hours as needed for mild pain.   aspirin EC 81 MG tablet Take 1 tablet (81 mg total) by mouth daily. Swallow whole.   levothyroxine (SYNTHROID) 50 MCG tablet Take 1 tablet (50 mcg total) by mouth daily.   Multiple Vitamin (MULTIVITAMIN) tablet Take 1 tablet by mouth daily.   sildenafil (VIAGRA) 100 MG tablet Take 1 tablet (100 mg total) by mouth daily as needed for erectile dysfunction.   No facility-administered encounter medications on file as of 11/16/2021.    Past Medical History:  Diagnosis Date   Ascites 07/15/2019   Cellulitis and abscess of  left lower extremity 07/21/2019   Chronic combined systolic (congestive) and diastolic (congestive) heart failure (HCC)    Cirrhosis of liver with ascites [facility])    Critical aortic valve stenosis    Hepatic cirrhosis ([place]) 07/15/2019   Hyperglycemia 07/21/2019   Left kidney mass 07/15/2019   Morbid obesity (HCC)    Pleural effusion    Pleural effusion on right 07/15/2019   S/P aortic valve replacement with bioprosthetic valve 08/06/2019   Size 25 mm // Echocardiogram 8/21: EF 55-60, mod LVH, Gr 2 DD, normal RVSF, mod LAE, trivial MR, AVR w mean 13 mmHg and no PVL   Severe aortic stenosis    Splenomegaly 09/12/6267   Systolic ejection murmur 4/85/4627    Past Surgical History:  Procedure Laterality Date   AORTIC VALVE REPLACEMENT N/A 08/06/2019   Procedure: AORTIC VALVE REPLACEMENT (AVR) using Margaretha Sheffield Resilia 25 MM Aortic Valve.;  Surgeon: Gaye Pollack, MD;  [facility];  Service: Open Heart Surgery;  Laterality: N/A;   CARDIAC CATHETERIZATION     EXPLORATION POST OPERATIVE OPEN HEART N/A 08/06/2019   Procedure: EXPLORATION POST OPERATIVE OPEN HEART;  Surgeon: Gaye Pollack, MD;  Location: [place];  Service: Open Heart Surgery;  Laterality: N/A;   IR THORACENTESIS ASP PLEURAL SPACE W/IMG GUIDE  06/25/2019   RIGHT/LEFT  HEART CATH AND CORONARY ANGIOGRAPHY N/A 07/28/2019   Procedure: RIGHT/LEFT HEART CATH AND CORONARY ANGIOGRAPHY;  Surgeon: Sherren Mocha, MD;  Location: [place] CV LAB;  Service: Cardiovascular;  Laterality: N/A;   TEE WITHOUT CARDIOVERSION N/A 08/06/2019   Procedure: TRANSESOPHAGEAL ECHOCARDIOGRAM (TEE);  Surgeon: Gaye Pollack, MD;  Location: [place];  Service: Open Heart Surgery;  Laterality: N/A;   WISDOM TOOTH EXTRACTION     college age   105 FRACTURE SURGERY     grade school    Family History  Problem Relation Age of Onset   Osteoporosis Mother    Other Father        died in a hurricane    Social History   Socioeconomic History   Marital status:  Single    Spouse name: Not on file   Number of children: Not on file   Years of education: 16   Highest education level: Bachelor's degree (e.g., BA, AB, BS)  Occupational History   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[employer]  Tobacco Use   Smoking status: Never   Smokeless tobacco: Never  Vaping Use   Vaping Use: Never used  Substance and Sexual Activity   Alcohol use: Yes    Comment: rarely   Drug use: Never   Sexual activity: Not on file  Other Topics Concern   Not on file  Social History Narrative   Right handed   Caffeine rare   Live in a two story apartment   Social Determinants of Health   Financial Resource Strain: Not on file  Food Insecurity: Not on file  Transportation Needs: Not on file  Physical Activity: Not on file  Stress: Not on file  Social Connections: Not on file  Intimate Partner Violence: Not on file    ROS      Objective    BP 134/88 (BP Location: Left Arm, Patient Position: Sitting, Cuff Size: Large)   Pulse 73   Temp 97.8 F (36.6 C) (Temporal)   Wt 293 lb (132.9 kg)   SpO2 99%   BMI 39.74 kg/m   Physical Exam Constitutional:      General: He is not in acute distress.    Appearance: He is not ill-appearing.  Cardiovascular:     Rate and Rhythm: Normal rate.  Pulmonary:     Effort: Pulmonary effort is normal.  Musculoskeletal:     Cervical back: Normal range of motion and neck supple.  Neurological:     General: No focal deficit present.     Mental Status: He is alert and oriented to person, place, and time.  Psychiatric:        Mood and Affect: Mood normal.        Behavior: Behavior normal.        Thought Content: Thought content normal.         Assessment & Plan:   Problem List Items Addressed This Visit       Digestive   Hepatic cirrhosis ([facility])    Avoid hepatotoxic substances. LFTs normal.         Endocrine   Hyperlipidemia associated with type 2 diabetes mellitus ([place])    He is not currently taking  a statin. We will need to address this      Relevant Orders   Lipid panel (Completed)   Hemoglobin A1c (Completed)   Hypothyroidism - Primary    Restart levothyroxine at 50 mcg. He was taking 75 mcg but stopped it several months ago. Recheck  in 4 weeks.       Relevant Medications   levothyroxine (SYNTHROID) 50 MCG tablet     Other   Elevated hemoglobin A1c    Hgb A1c 6.6% and is not currently on medication. He has been on metformin in the past. Check A1c and follow up. Start on medication and discuss standards of care with DM pending lab results.       Relevant Orders   Hemoglobin A1c (Completed)   Hypertriglyceridemia    Check fasting lipids. Is not currently on statin therapy. Will address pending results.       Relevant Orders   Lipid panel (Completed)   Vitamin D deficiency    Check vitamin D level and adjust supplemental dose as appropriate.       Relevant Orders   VITAMIN D 25 Hydroxy (Vit-D Deficiency, Fractures) (Completed)    Return in about 4 weeks (around 12/14/2021) for thyroid and other conditions .   Harland Dingwall, NP-C

## 2021-11-17 LAB — VITAMIN D 25 HYDROXY (VIT D DEFICIENCY, FRACTURES): VITD: 17.73 ng/mL — ABNORMAL LOW (ref 30.00–100.00)

## 2021-11-17 LAB — LDL CHOLESTEROL, DIRECT: Direct LDL: 140 mg/dL

## 2021-11-17 LAB — LIPID PANEL
Cholesterol: 223 mg/dL — ABNORMAL HIGH (ref 0–200)
HDL: 39.6 mg/dL (ref >39.00)
NonHDL: 183.02
Total CHOL/HDL Ratio: 6
Triglycerides: 315 mg/dL — ABNORMAL HIGH (ref 0.0–149.0)
VLDL: 63 mg/dL — ABNORMAL HIGH (ref 0.0–40.0)

## 2021-11-17 LAB — HEMOGLOBIN A1C: Hgb A1c MFr Bld: 7.1 % — ABNORMAL HIGH (ref 4.6–6.5)

## 2021-11-20 DIAGNOSIS — E559 Vitamin D deficiency, unspecified: Secondary | ICD-10-CM | POA: Insufficient documentation

## 2021-11-20 DIAGNOSIS — E781 Pure hyperglyceridemia: Secondary | ICD-10-CM | POA: Insufficient documentation

## 2021-11-20 DIAGNOSIS — R7309 Other abnormal glucose: Secondary | ICD-10-CM | POA: Insufficient documentation

## 2021-11-20 DIAGNOSIS — E1169 Type 2 diabetes mellitus with other specified complication: Secondary | ICD-10-CM | POA: Insufficient documentation

## 2021-11-20 NOTE — Assessment & Plan Note (Addendum)
Avoid hepatotoxic substances. LFTs normal.

## 2021-11-20 NOTE — Assessment & Plan Note (Signed)
Check vitamin D level and adjust supplemental dose as appropriate.

## 2021-11-20 NOTE — Assessment & Plan Note (Signed)
Check fasting lipids. Is not currently on statin therapy. Will address pending results.

## 2021-11-20 NOTE — Assessment & Plan Note (Signed)
He is not currently taking a statin. We will need to address this

## 2021-11-20 NOTE — Assessment & Plan Note (Signed)
Restart levothyroxine at 50 mcg. He was taking 75 mcg but stopped it several months ago. Recheck in 4 weeks.

## 2021-11-20 NOTE — Assessment & Plan Note (Signed)
Hgb A1c 6.6% and is not currently on medication. He has been on metformin in the past. Check A1c and follow up. Start on medication and discuss standards of care with DM pending lab results.

## 2021-11-21 ENCOUNTER — Other Ambulatory Visit: Payer: Self-pay | Admitting: Family Medicine

## 2021-11-21 DIAGNOSIS — E118 Type 2 diabetes mellitus with unspecified complications: Secondary | ICD-10-CM

## 2021-11-21 DIAGNOSIS — E781 Pure hyperglyceridemia: Secondary | ICD-10-CM

## 2021-11-21 DIAGNOSIS — Z5320 Procedure and treatment not carried out because of patient's decision for unspecified reasons: Secondary | ICD-10-CM | POA: Insufficient documentation

## 2021-11-21 DIAGNOSIS — E559 Vitamin D deficiency, unspecified: Secondary | ICD-10-CM

## 2021-11-21 DIAGNOSIS — E1169 Type 2 diabetes mellitus with other specified complication: Secondary | ICD-10-CM

## 2021-11-21 MED ORDER — METFORMIN HCL 500 MG PO TABS: 500.0000 mg | ORAL_TABLET | Freq: Two times a day (BID) | ORAL | 1 refills | Status: DC

## 2021-11-21 MED ORDER — VITAMIN D (ERGOCALCIFEROL) 1.25 MG (50000 UNIT) PO CAPS: 50000.0000 [IU] | ORAL_CAPSULE | ORAL | 0 refills | Status: DC

## 2021-11-23 ENCOUNTER — Ambulatory Visit: Payer: Commercial Managed Care - HMO | Attending: Neurology | Admitting: Occupational Therapy

## 2021-11-23 ENCOUNTER — Encounter: Payer: Self-pay | Admitting: Occupational Therapy

## 2021-11-23 DIAGNOSIS — M25342 Other instability, left hand: Secondary | ICD-10-CM | POA: Diagnosis present

## 2021-11-23 DIAGNOSIS — M6281 Muscle weakness (generalized): Secondary | ICD-10-CM | POA: Diagnosis present

## 2021-11-23 DIAGNOSIS — R278 Other lack of coordination: Secondary | ICD-10-CM | POA: Diagnosis present

## 2021-11-23 NOTE — Therapy (Signed)
OUTPATIENT OCCUPATIONAL THERAPY NEURO EVALUATION  Patient Name: Steve Richards MRN: [id number] DOB:[birth year], 56 y.o., male Today's Date: 11/23/2021  PCP: Harland Dingwall REFERRING PROVIDER: Kai Levins   OT End of Session - 11/23/21 1853     Visit Number 1    Number of Visits 13    Date for OT Re-Evaluation 01/22/22    Authorization Type 30 VL PT/OT/Chiropractic services    OT Start Time 1530    OT Stop Time 1620    OT Time Calculation (min) 50 min    Activity Tolerance Patient tolerated treatment well    Behavior During Therapy [facility] for tasks assessed/performed             Past Medical History:  Diagnosis Date   Ascites 07/15/2019   Cellulitis and abscess of left lower extremity 07/21/2019   Chronic combined systolic (congestive) and diastolic (congestive) heart failure (HCC)    Cirrhosis of liver with ascites ([place])    Critical aortic valve stenosis    Hepatic cirrhosis ([place]) 07/15/2019   Hyperglycemia 07/21/2019   Left kidney mass 07/15/2019   Morbid obesity (HCC)    Pleural effusion    Pleural effusion on right 07/15/2019   S/P aortic valve replacement with bioprosthetic valve 08/06/2019   Size 25 mm // Echocardiogram 8/21: EF 55-60, mod LVH, Gr 2 DD, normal RVSF, mod LAE, trivial MR, AVR w mean 13 mmHg and no PVL   Severe aortic stenosis    Splenomegaly 09/02/1599   Systolic ejection murmur 0/93/2355   Past Surgical History:  Procedure Laterality Date   AORTIC VALVE REPLACEMENT N/A 08/06/2019   Procedure: AORTIC VALVE REPLACEMENT (AVR) using Margaretha Sheffield Resilia 25 MM Aortic Valve.;  Surgeon: Gaye Pollack, MD;  [facility];  Service: Open Heart Surgery;  Laterality: N/A;   CARDIAC CATHETERIZATION     EXPLORATION POST OPERATIVE OPEN HEART N/A 08/06/2019   Procedure: EXPLORATION POST OPERATIVE OPEN HEART;  Surgeon: Gaye Pollack, MD;  Location: [place];  Service: Open Heart Surgery;  Laterality: N/A;   IR THORACENTESIS ASP PLEURAL SPACE W/IMG GUIDE  06/25/2019    RIGHT/LEFT HEART CATH AND CORONARY ANGIOGRAPHY N/A 07/28/2019   Procedure: RIGHT/LEFT HEART CATH AND CORONARY ANGIOGRAPHY;  Surgeon: Sherren Mocha, MD;  Location: [place] CV LAB;  Service: Cardiovascular;  Laterality: N/A;   TEE WITHOUT CARDIOVERSION N/A 08/06/2019   Procedure: TRANSESOPHAGEAL ECHOCARDIOGRAM (TEE);  Surgeon: Gaye Pollack, MD;  Location: [place];  Service: Open Heart Surgery;  Laterality: N/A;   WISDOM TOOTH EXTRACTION     college age   4 FRACTURE SURGERY     grade school   Patient Active Problem List   Diagnosis Date Noted   Controlled type 2 diabetes mellitus with complication, without long-term current use of insulin ([place]) 11/21/2021   Refusal of statin medication by patient 11/21/2021   Vitamin D deficiency 11/20/2021   Elevated hemoglobin A1c 11/20/2021   Hypertriglyceridemia 11/20/2021   Hyperlipidemia associated with type 2 diabetes mellitus ([place]) 11/20/2021   Hypothyroidism 12/10/2019   S/P AVR 08/07/2019   S/P aortic valve replacement with bioprosthetic valve 08/06/2019   Aortic valve stenosis 08/06/2019   Severe aortic stenosis    New onset of congestive heart failure ([place]) 07/21/2019   Cellulitis and abscess of left lower extremity 07/21/2019   Hyperglycemia 07/21/2019   Obesity, Class III, BMI 40-49.9 (morbid obesity) ([place]) 07/21/2019   Ascites 07/15/2019   Hepatic cirrhosis ([place]) 07/15/2019   Pleural effusion on right 07/15/2019   Left  kidney mass 07/15/2019   Splenomegaly 59/56/3875   Systolic ejection murmur 64/33/2951    ONSET DATE: Dec 2022  REFERRING DIAG: R29.898 (ICD-10-CM) - Left hand weakness 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 No  FALLS: Has patient fallen in last 6 months? No  LIVING ENVIRONMENT: Lives with: lives alone Lives in: House/apartment   PLOF: Independent with basic ADLs  PATIENT GOALS improve functional use of LUE  OBJECTIVE:   HAND DOMINANCE: Right  ADLs: Overall ADLs: Independent Transfers/ambulation related to ADLs: Eating: Independent Grooming: Independent UB Dressing: Independent LB Dressing: Independent Toileting: Independent Bathing: Independent    IADLs: Shopping: Independent Light housekeeping: Independent Meal Prep: Independent    POSTURE COMMENTS:  No Significant postural limitations   ACTIVITY TOLERANCE: Activity tolerance: WFL    UPPER EXTREMITY ROM    Uses tenodesis - cannot extend fingers and wrist simultaneously Cannot achieve neutral wrist with fingers extended  Active ROM Right eval Left eval  Shoulder flexion  WFL  Shoulder abduction    Shoulder adduction    Shoulder extension    Shoulder internal rotation    Shoulder external rotation    Elbow flexion    Elbow extension    Wrist flexion    Wrist extension    Wrist ulnar deviation  0  Wrist radial deviation  0  Wrist pronation    Wrist supination    (Blank rows = not tested)   UPPER EXTREMITY MMT:     MMT Right eval Left eval  Shoulder flexion  4+/5  Shoulder abduction  4+/5  Shoulder adduction    Shoulder extension    Shoulder internal rotation    Shoulder external rotation    Middle trapezius    Lower trapezius    Elbow flexion  4+/5  Elbow extension  4+/5  Wrist flexion    Wrist extension    Wrist ulnar deviation    Wrist radial deviation    Wrist pronation    Wrist supination    (Blank rows = not tested)  HAND FUNCTION: Grip strength: Right: 78.4 lbs; Left: 41.2  lbs, Lateral pinch: Right: 20 lbs, Left: 8 lbs, and 3 point pinch: Right: 12 lbs, Left: 4 lbs  COORDINATION: Has difficulty with palmar abduction of thumb (left)  Difficulty shaping hand to object more than 3Inches in diameter Lacks radial/ ulnar deviation Cannot extend fingers and wrist simultaneously  SENSATION: Tricep stretch - reports congestion back of arm  EDEMA: NA  MUSCLE TONE: LUE: Mild and Hypotonic        TODAY'S TREATMENT:  Patient with muscle wasting in thenar eminence - inadequate CMC joint motion actively - Thumb adducted into hand.  Attempted fitting of neoprene CMC brace to support CMC joint, however, patient then lacks strength to oppose thumb.   Attempted longer wrist/thumb opponens brace - similar difficulty - blocks available movement.   Patient may benefit from radial palsy like splint to increase functional extension in digits.   Discussed with patient that it would be beneficial to hold on starting OT program until nerve  conduction test results in (scheduled for 10/23)   Patient in agreement.     PATIENT EDUCATION: Education details: OT Plan of care and potential splint options Person educated: Patient Education method: Explanation Education comprehension: verbalized understanding   HOME EXERCISE PROGRAM: NA    GOALS: Goals reviewed with patient? No  SHORT TERM GOALS: Target date: 01/23/22  Patient will be independent in an HEP designed to improve grasp and pinch strength in LUE  Goal status: INITIAL  2.  Patient will demonstrate awareness of splinting / positioning recommendations  Goal status: INITIAL  3.  Patient will demonstrate 3 lb increase in grip strength LUE to aide with safely carrying items  Goal status: INITIAL  4.  Patient will demonstrate improved ability to approach object with wrist in neutral and digits extended for optimal hand position  Goal status: INITIAL    LONG TERM GOALS: Target date: 02/22/22  Patient  will complete updated HEP  Goal status: INITIAL  2.  Patient will demonstrate 10 lb increase in left grip strength  Goal status: INITIAL  3.  Patient will demonstrate 3 lb increase in lateral pinch strength  Goal status: INITIAL 4.  Patient will demonstrate ability to do long strokes of massage using heel of hand (wrist neutral to slight extension) with digits extended  Goal status: INITIAL  5.  Patient will demonstrate 3-5* of active wrist ulnar / radial deviation  Goal status: INITIAL    ASSESSMENT:  CLINICAL IMPRESSION: Patient is a 56 y.o. male who was seen today for occupational therapy evaluation for left hand weakness and neuralgic amyotrophy.  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   PERFORMANCE DEFICITS in functional skills including coordination, dexterity, tone, ROM, and strength,  IMPAIRMENTS are limiting patient from ADLs, IADLs, work, and leisure.   COMORBIDITIES may have co-morbidities  that affects occupational performance. Patient will benefit from skilled OT to address above impairments and improve overall function.  MODIFICATION OR ASSISTANCE TO COMPLETE EVALUATION: Min-Moderate modification of tasks or assist with assess necessary to complete an evaluation.  OT OCCUPATIONAL PROFILE AND HISTORY: Detailed assessment: Review of records and additional review of physical, cognitive, psychosocial history related to current functional performance.  CLINICAL DECISION MAKING: Moderate - several treatment options, min-mod task modification necessary  REHAB POTENTIAL: Good  EVALUATION COMPLEXITY: Moderate    PLAN: OT FREQUENCY: 1x/week to start week of 10/31 following nerve conduction study  OT DURATION: 12 weeks  PLANNED INTERVENTIONS: self care/ADL training, therapeutic exercise, therapeutic activity, neuromuscular re-education, manual therapy, passive range of  motion, splinting, electrical stimulation, ultrasound, patient/family education, and DME and/or AE instructions  RECOMMENDED OTHER SERVICES: NA  CONSULTED AND AGREED WITH PLAN OF CARE: Patient  PLAN FOR NEXT SESSION: Read nerve conduction results, Determine splint needs - ? Radial nerve palsy splint for function, NMR as able LUE, May need exploration of soft tissue limitations - scapular border.     Mariah Milling, OT 11/23/2021, 6:56 PM

## 2021-12-14 ENCOUNTER — Ambulatory Visit (INDEPENDENT_AMBULATORY_CARE_PROVIDER_SITE_OTHER): Payer: Commercial Managed Care - HMO | Admitting: Family Medicine

## 2021-12-14 ENCOUNTER — Encounter: Payer: Self-pay | Admitting: Family Medicine

## 2021-12-14 VITALS — BP 116/76 | HR 68 | Temp 97.6°F | Ht 72.0 in | Wt 276.0 lb

## 2021-12-14 DIAGNOSIS — E039 Hypothyroidism, unspecified: Secondary | ICD-10-CM

## 2021-12-14 DIAGNOSIS — Z79899 Other long term (current) drug therapy: Secondary | ICD-10-CM | POA: Diagnosis not present

## 2021-12-14 DIAGNOSIS — E559 Vitamin D deficiency, unspecified: Secondary | ICD-10-CM

## 2021-12-14 DIAGNOSIS — E118 Type 2 diabetes mellitus with unspecified complications: Secondary | ICD-10-CM | POA: Diagnosis not present

## 2021-12-14 DIAGNOSIS — E781 Pure hyperglyceridemia: Secondary | ICD-10-CM

## 2021-12-14 LAB — TSH: TSH: 7.67 u[IU]/mL — ABNORMAL HIGH (ref 0.35–5.50)

## 2021-12-14 NOTE — Progress Notes (Signed)
Subjective:     Patient ID: Steve Richards, male    DOB: 09-16-1965, 56 y.o.   MRN: [id number]  Chief Complaint  Patient presents with   Follow-up    4 week f/u for hypothyroidism     HPI Patient is in today for a 4 wk f/u on hypothyroidism and starting back on levothyroxine 50 mcg 4 wks ago. He is taking it daily on an empty stomach.   Reviewed labs with patient from previous visit including A1c 7.1% and he started on metformin 500 mg.   He also started on a vitamin D supplement.     Health Maintenance Due  Topic Date Due   FOOT EXAM  Never done   OPHTHALMOLOGY EXAM  Never done   Diabetic kidney evaluation - Urine ACR  Never done   TETANUS/TDAP  Never done   COVID-19 Vaccine (4 - Pfizer risk series) 02/22/2021    Past Medical History:  Diagnosis Date   Ascites 07/15/2019   Cellulitis and abscess of left lower extremity 07/21/2019   Chronic combined systolic (congestive) and diastolic (congestive) heart failure (HCC)    Cirrhosis of liver with ascites (HCC)    Critical aortic valve stenosis    Hepatic cirrhosis ([place]) 07/15/2019   Hyperglycemia 07/21/2019   Left kidney mass 07/15/2019   Morbid obesity (HCC)    Pleural effusion    Pleural effusion on right 07/15/2019   S/P aortic valve replacement with bioprosthetic valve 08/06/2019   Size 25 mm // Echocardiogram 8/21: EF 55-60, mod LVH, Gr 2 DD, normal RVSF, mod LAE, trivial MR, AVR w mean 13 mmHg and no PVL   Severe aortic stenosis    Splenomegaly 2/95/2841   Systolic ejection murmur 05/26/4008    Past Surgical History:  Procedure Laterality Date   AORTIC VALVE REPLACEMENT N/A 08/06/2019   Procedure: AORTIC VALVE REPLACEMENT (AVR) using Edwards INSPIRIS Resilia 25 MM Aortic Valve.;  Surgeon: Gaye Pollack, MD;  [facility];  Service: Open Heart Surgery;  Laterality: N/A;   CARDIAC CATHETERIZATION     EXPLORATION POST OPERATIVE OPEN HEART N/A 08/06/2019   Procedure: EXPLORATION POST OPERATIVE OPEN HEART;  Surgeon:  Gaye Pollack, MD;  Location: [place];  Service: Open Heart Surgery;  Laterality: N/A;   IR THORACENTESIS ASP PLEURAL SPACE W/IMG GUIDE  06/25/2019   RIGHT/LEFT HEART CATH AND CORONARY ANGIOGRAPHY N/A 07/28/2019   Procedure: RIGHT/LEFT HEART CATH AND CORONARY ANGIOGRAPHY;  Surgeon: Sherren Mocha, MD;  Location: [place] CV LAB;  Service: Cardiovascular;  Laterality: N/A;   TEE WITHOUT CARDIOVERSION N/A 08/06/2019   Procedure: TRANSESOPHAGEAL ECHOCARDIOGRAM (TEE);  Surgeon: Gaye Pollack, MD;  Location: [place];  Service: Open Heart Surgery;  Laterality: N/A;   WISDOM TOOTH EXTRACTION     college age   34 FRACTURE SURGERY     grade school    Family History  Problem Relation Age of Onset   Osteoporosis Mother    Other Father        died in a hurricane    Social History   Socioeconomic History   Marital status: Single    Spouse name: Not on file   Number of children: Not on file   Years of education: 16   Highest education level: Bachelor's degree (e.g., BA, AB, BS)  Occupational History   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[employer]  Tobacco Use   Smoking status: Never   Smokeless tobacco: Never  Vaping Use   Vaping Use: Never  used  Substance and Sexual Activity   Alcohol use: Yes    Comment: rarely   Drug use: Never   Sexual activity: Not on file  Other Topics Concern   Not on file  Social History Narrative   Right handed   Caffeine rare   Live in a two story apartment   Social Determinants of Health   Financial Resource Strain: Not on file  Food Insecurity: Not on file  Transportation Needs: Not on file  Physical Activity: Not on file  Stress: Not on file  Social Connections: Not on file  Intimate Partner Violence: Not on file    Outpatient Medications Prior to Visit  Medication Sig Dispense Refill   acetaminophen (TYLENOL) 325 MG tablet Take 2 tablets (650 mg total) by mouth every 6 (six) hours as needed for mild pain.     aspirin EC 81 MG tablet  Take 1 tablet (81 mg total) by mouth daily. Swallow whole. 30 tablet 11   metFORMIN (GLUCOPHAGE) 500 MG tablet Take 1 tablet (500 mg total) by mouth 2 (two) times daily with a meal. 90 tablet 1   Multiple Vitamin (MULTIVITAMIN) tablet Take 1 tablet by mouth daily.     sildenafil (VIAGRA) 100 MG tablet Take 1 tablet (100 mg total) by mouth daily as needed for erectile dysfunction. 10 tablet 6   Vitamin D, Ergocalciferol, (DRISDOL) 1.25 MG (50000 UNIT) CAPS capsule Take 1 capsule (50,000 Units total) by mouth every 7 (seven) days. 12 capsule 0   levothyroxine (SYNTHROID) 50 MCG tablet Take 1 tablet (50 mcg total) by mouth daily. 30 tablet 0   No facility-administered medications prior to visit.    No Known Allergies  ROS     Objective:    Physical Exam  BP 116/76 (BP Location: Left Arm, Patient Position: Sitting, Cuff Size: Large)   Pulse 68   Temp 97.6 F (36.4 C) (Temporal)   Ht 6' (1.829 m)   Wt 276 lb (125.2 kg)   SpO2 96%   BMI 37.43 kg/m  Wt Readings from Last 3 Encounters:  12/14/21 276 lb (125.2 kg)  11/16/21 293 lb (132.9 kg)  11/02/21 292 lb (132.5 kg)   Alert and oriented and in no acute distress. Not otherwise examined.     Assessment & Plan:   Problem List Items Addressed This Visit       Endocrine   Controlled type 2 diabetes mellitus with complication, without long-term current use of insulin (HCC)   Hypothyroidism - Primary   Relevant Orders   TSH (Completed)     Other   Hypertriglyceridemia   Vitamin D deficiency   Other Visit Diagnoses     Medication management       Relevant Orders   TSH (Completed)      Check TSH and follow up. May need to increase levothyroxine dose.   I am having Steve Richards "Either Gershon Mussel or [place] is fine" maintain his multivitamin, acetaminophen, aspirin EC, sildenafil, Vitamin D (Ergocalciferol), and metFORMIN.  No orders of the defined types were placed in this encounter.

## 2021-12-14 NOTE — Patient Instructions (Signed)
Please go downstairs for recheck of your TSH before you leave today.  Continue current medications.  I will be in touch with your results and let you know if we need to adjust the dose of levothyroxine.  Continue with a healthy diet.  I will see you back in 3 months or sooner if needed.

## 2021-12-15 ENCOUNTER — Other Ambulatory Visit: Payer: Self-pay | Admitting: Family Medicine

## 2021-12-15 DIAGNOSIS — E039 Hypothyroidism, unspecified: Secondary | ICD-10-CM

## 2021-12-15 MED ORDER — LEVOTHYROXINE SODIUM 75 MCG PO TABS: 75.0000 ug | ORAL_TABLET | Freq: Every day | ORAL | 1 refills | Status: DC

## 2021-12-15 NOTE — Progress Notes (Signed)
I will send in a new prescription of his levothyroxine now. He needs a lab visit in 4-5 weeks for TSH only. Thanks.

## 2021-12-25 ENCOUNTER — Encounter: Payer: 59 | Admitting: Neurology

## 2022-01-04 ENCOUNTER — Ambulatory Visit: Payer: Commercial Managed Care - HMO | Admitting: Occupational Therapy

## 2022-01-11 ENCOUNTER — Encounter: Payer: Commercial Managed Care - HMO | Admitting: Occupational Therapy

## 2022-01-12 ENCOUNTER — Encounter: Payer: Self-pay | Admitting: Family Medicine

## 2022-01-12 ENCOUNTER — Ambulatory Visit (INDEPENDENT_AMBULATORY_CARE_PROVIDER_SITE_OTHER): Payer: Commercial Managed Care - HMO | Admitting: Family Medicine

## 2022-01-12 VITALS — BP 146/84 | HR 88 | Temp 97.8°F | Ht 72.0 in | Wt 268.0 lb

## 2022-01-12 DIAGNOSIS — M25361 Other instability, right knee: Secondary | ICD-10-CM

## 2022-01-12 DIAGNOSIS — H1033 Unspecified acute conjunctivitis, bilateral: Secondary | ICD-10-CM

## 2022-01-12 MED ORDER — POLYMYXIN B-TRIMETHOPRIM 10000-0.1 UNIT/ML-% OP SOLN: 1.0000 [drp] | OPHTHALMIC | 0 refills | Status: DC

## 2022-01-12 NOTE — Progress Notes (Signed)
Subjective:     Patient ID: Steve Richards, male    DOB: 1966/01/08, 56 y.o.   MRN: [id number]  Chief Complaint  Patient presents with   Knee Pain    Right knee pain starting October 19th when he tweaked it walking   Facial Swelling    Eye swelling in the right eye due to allergies on and off the past several weeks     HPI Patient is in today for a 3 wk hx of intermittent right knee instability.   He also has swollen eyes with irritation, eye swelling, itching, clear drainage and matting in the morning with thick green drainage.   No fever, chills, URI symptoms, knee pain or swelling. No weakness.    Health Maintenance Due  Topic Date Due   FOOT EXAM  Never done   OPHTHALMOLOGY EXAM  Never done   Diabetic kidney evaluation - Urine ACR  Never done   TETANUS/TDAP  Never done   COVID-19 Vaccine (4 - Pfizer risk series) 02/22/2021    Past Medical History:  Diagnosis Date   Ascites 07/15/2019   Cellulitis and abscess of left lower extremity 07/21/2019   Chronic combined systolic (congestive) and diastolic (congestive) heart failure (HCC)    Cirrhosis of liver with ascites (HCC)    Critical aortic valve stenosis    Hepatic cirrhosis ([place]) 07/15/2019   Hyperglycemia 07/21/2019   Left kidney mass 07/15/2019   Morbid obesity (HCC)    Pleural effusion    Pleural effusion on right 07/15/2019   S/P aortic valve replacement with bioprosthetic valve 08/06/2019   Size 25 mm // Echocardiogram 8/21: EF 55-60, mod LVH, Gr 2 DD, normal RVSF, mod LAE, trivial MR, AVR w mean 13 mmHg and no PVL   Severe aortic stenosis    Splenomegaly 09/08/2374   Systolic ejection murmur 2/83/1517    Past Surgical History:  Procedure Laterality Date   AORTIC VALVE REPLACEMENT N/A 08/06/2019   Procedure: AORTIC VALVE REPLACEMENT (AVR) using Edwards INSPIRIS Resilia 25 MM Aortic Valve.;  Surgeon: Gaye Pollack, MD;  Location: [facility];  Service: Open Heart Surgery;  Laterality: N/A;   CARDIAC CATHETERIZATION      EXPLORATION POST OPERATIVE OPEN HEART N/A 08/06/2019   Procedure: EXPLORATION POST OPERATIVE OPEN HEART;  Surgeon: Gaye Pollack, MD;  Location: [facility];  Service: Open Heart Surgery;  Laterality: N/A;   IR THORACENTESIS ASP PLEURAL SPACE W/IMG GUIDE  06/25/2019   RIGHT/LEFT HEART CATH AND CORONARY ANGIOGRAPHY N/A 07/28/2019   Procedure: RIGHT/LEFT HEART CATH AND CORONARY ANGIOGRAPHY;  Surgeon: Sherren Mocha, MD;  Location: [facility];  Service: Cardiovascular;  Laterality: N/A;   TEE WITHOUT CARDIOVERSION N/A 08/06/2019   Procedure: TRANSESOPHAGEAL ECHOCARDIOGRAM (TEE);  Surgeon: Gaye Pollack, MD;  Location: [facility];  Service: Open Heart Surgery;  Laterality: N/A;   WISDOM TOOTH EXTRACTION     college age   46 FRACTURE SURGERY     grade school    Family History  Problem Relation Age of Onset   Osteoporosis Mother    Other Father        died in a hurricane    Social History   Socioeconomic History   Marital status: Single    Spouse name: Not on file   Number of children: Not on file   Years of education: 16   Highest education level: Bachelor's degree (e.g., BA, AB, BS)  Occupational History   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[employer]  Tobacco Use   Smoking status: Never   Smokeless tobacco: Never  Vaping Use   Vaping Use: Never used  Substance and Sexual Activity   Alcohol use: Yes    Comment: rarely   Drug use: Never   Sexual activity: Not on file  Other Topics Concern   Not on file  Social History Narrative   Right handed   Caffeine rare   Live in a two story apartment   Social Determinants of Health   Financial Resource Strain: Not on file  Food Insecurity: Not on file  Transportation Needs: Not on file  Physical Activity: Not on file  Stress: Not on file  Social Connections: Not on file  Intimate Partner Violence: Not on file    Outpatient Medications Prior to Visit  Medication Sig Dispense Refill   acetaminophen (TYLENOL) 325 MG tablet  Take 2 tablets (650 mg total) by mouth every 6 (six) hours as needed for mild pain.     aspirin EC 81 MG tablet Take 1 tablet (81 mg total) by mouth daily. Swallow whole. 30 tablet 11   levothyroxine (SYNTHROID) 75 MCG tablet Take 1 tablet (75 mcg total) by mouth daily. 30 tablet 1   metFORMIN (GLUCOPHAGE) 500 MG tablet Take 1 tablet (500 mg total) by mouth 2 (two) times daily with a meal. 90 tablet 1   Multiple Vitamin (MULTIVITAMIN) tablet Take 1 tablet by mouth daily.     sildenafil (VIAGRA) 100 MG tablet Take 1 tablet (100 mg total) by mouth daily as needed for erectile dysfunction. 10 tablet 6   Vitamin D, Ergocalciferol, (DRISDOL) 1.25 MG (50000 UNIT) CAPS capsule Take 1 capsule (50,000 Units total) by mouth every 7 (seven) days. 12 capsule 0   No facility-administered medications prior to visit.    No Known Allergies  ROS     Objective:    Physical Exam Constitutional:      General: He is not in acute distress.    Appearance: He is not ill-appearing.  Eyes:     Extraocular Movements: Extraocular movements intact.     Conjunctiva/sclera: Conjunctivae normal.     Pupils: Pupils are equal, round, and reactive to light.  Cardiovascular:     Rate and Rhythm: Normal rate.  Pulmonary:     Effort: Pulmonary effort is normal.  Musculoskeletal:     Right knee: No swelling, effusion or erythema. Normal range of motion. No tenderness. Normal patellar mobility. Normal pulse.     Right lower leg: No edema.     Left lower leg: No edema.     Comments: No obvious laxity.   Skin:    General: Skin is warm and dry.  Neurological:     General: No focal deficit present.     Mental Status: He is alert and oriented to person, place, and time.  Psychiatric:        Mood and Affect: Mood normal.        Behavior: Behavior normal.     BP (!) 146/84 (BP Location: Left Arm, Patient Position: Sitting, Cuff Size: Large)   Pulse 88   Temp 97.8 F (36.6 C) (Temporal)   Ht 6' (1.829 m)   Wt 268  lb (121.6 kg)   SpO2 98%   BMI 36.35 kg/m  Wt Readings from Last 3 Encounters:  01/12/22 268 lb (121.6 kg)  12/14/21 276 lb (125.2 kg)  11/16/21 293 lb (132.9 kg)       Assessment & Plan:   Problem List  Items Addressed This Visit   None Visit Diagnoses     Acute bacterial conjunctivitis of both eyes    -  Primary   Relevant Medications   trimethoprim-polymyxin b (POLYTRIM) ophthalmic solution   Knee instability, right       Relevant Orders   Ambulatory referral to Sports Medicine     Polytrim prescribed. May also use allergy drops or re-wetting drops also. Cool or warm compresses prn.  Referral to [facility] for further evaluation of knee instability.   I am having Lenix Kidd "Either Gershon Mussel or Tereso is fine" start on trimethoprim-polymyxin b. I am also having him maintain his multivitamin, acetaminophen, aspirin EC, sildenafil, Vitamin D (Ergocalciferol), metFORMIN, and levothyroxine.  Meds ordered this encounter  Medications   trimethoprim-polymyxin b (POLYTRIM) ophthalmic solution    Sig: Place 1 drop into the right eye every 4 (four) hours.    Dispense:  10 mL    Refill:  0    Order Specific Question:   Supervising Provider    Answer:   Pricilla Holm A [[id number]]

## 2022-01-18 ENCOUNTER — Other Ambulatory Visit: Payer: Self-pay | Admitting: Family Medicine

## 2022-01-18 ENCOUNTER — Ambulatory Visit (INDEPENDENT_AMBULATORY_CARE_PROVIDER_SITE_OTHER): Payer: Commercial Managed Care - HMO

## 2022-01-18 ENCOUNTER — Ambulatory Visit: Payer: Self-pay

## 2022-01-18 ENCOUNTER — Encounter: Payer: Commercial Managed Care - HMO | Admitting: Occupational Therapy

## 2022-01-18 ENCOUNTER — Ambulatory Visit: Payer: Commercial Managed Care - HMO | Admitting: Family Medicine

## 2022-01-18 ENCOUNTER — Ambulatory Visit: Payer: Commercial Managed Care - HMO

## 2022-01-18 ENCOUNTER — Other Ambulatory Visit (INDEPENDENT_AMBULATORY_CARE_PROVIDER_SITE_OTHER): Payer: Commercial Managed Care - HMO

## 2022-01-18 VITALS — BP 132/84 | HR 80 | Ht 72.0 in | Wt 273.0 lb

## 2022-01-18 DIAGNOSIS — M25561 Pain in right knee: Secondary | ICD-10-CM

## 2022-01-18 DIAGNOSIS — E039 Hypothyroidism, unspecified: Secondary | ICD-10-CM | POA: Diagnosis not present

## 2022-01-18 LAB — TSH: TSH: 6.99 u[IU]/mL — ABNORMAL HIGH (ref 0.35–5.50)

## 2022-01-18 MED ORDER — LEVOTHYROXINE SODIUM 88 MCG PO TABS: 88.0000 ug | ORAL_TABLET | Freq: Every day | ORAL | 1 refills | Status: DC

## 2022-01-18 NOTE — Progress Notes (Signed)
   I, Peterson Lombard, LAT, ATC acting as a scribe for Lynne Leader, MD.  Subjective:    CC: R knee pain  HPI: Pt is a 56 y/o male c/o R knee pain ongoing since 12/21/21 when he "tweaked" it while walking. 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 Pt locates pain to the anterior-lateral aspect of the R knee and into the proximal lower leg. Pt c/o R knee/leg feeling unstable.   R Knee swelling: yes Mechanical symptoms: no Aggravates: activity, walking Treatments tried:  Pertinent review of Systems: No fevers or chills  Relevant historical information: Hepatic cirrhosis.  Aortic valve repair.   Objective:    Vitals:   01/18/22 1509  BP: 132/84  Pulse: 80  SpO2: 98%   General: Well Developed, well nourished, and in no acute distress.   MSK: Right knee moderate effusion normal motion with minimal crepitation. Tender palpation medial joint line. Stable ligamentous exam. Intact strength..  Lab and Radiology Results  Diagnostic Limited MSK Ultrasound of: Right knee Quad tendon intact. Patellar tendon is intact. Small joint effusion is present. Medial joint line slight degeneration of the medial meniscus.  Lateral joint line normal. Posterior knee no Baker's cyst. Impression: Medial compartment DJD.    X-ray images right knee obtained today personally and independently interpreted Mild medial DJD.  No acute fractures. Await formal radiology review   Impression and Recommendations:    Assessment and Plan: 56 y.o. male with knee pain concerning for degenerative meniscus tear versus exacerbation of DJD.  Plan to continue conservative management options with Voltaren gel and knee compression sleeve.  If not improving consider steroid injection.  Additionally could proceed with MRI.  His goal is to run a half marathon in April which he has done before.  PDMP not reviewed this encounter. Orders  Placed This Encounter  Procedures   [place] LIMITED JOINT SPACE STRUCTURES LOW RIGHT(NO LINKED CHARGES)    Order Specific Question:   Reason for Exam (SYMPTOM  OR DIAGNOSIS REQUIRED)    Answer:   right knee pain    Order Specific Question:   Preferred imaging location?    Answer:   [facility]   DG Knee AP/LAT W/[place] Right    Standing Status:   Future    Number of Occurrences:   1    Standing Expiration Date:   02/17/2022    Order Specific Question:   Reason for Exam (SYMPTOM  OR DIAGNOSIS REQUIRED)    Answer:   right knee pain    Order Specific Question:   Preferred imaging location?    Answer:   [facility]   No orders of the defined types were placed in this encounter.   Discussed warning signs or symptoms. Please see discharge instructions. Patient expresses understanding.   The above documentation has been reviewed and is accurate and complete Lynne Leader, M.D.

## 2022-01-18 NOTE — Progress Notes (Signed)
TSH did not improve that much. I will send in a higher dose. Let's schedule an in office visit (FASTING) in 4 weeks. We can recheck his diabetes, cholesterol, thyroid and vitamin D.

## 2022-01-18 NOTE — Patient Instructions (Signed)
Thank you for coming in today.   Please get an Xray today before you leave   Please use Voltaren gel (Generic Diclofenac Gel) up to 4x daily for pain as needed.  This is available over-the-counter as both the name brand Voltaren gel and the generic diclofenac gel.   I recommend you obtained a compression sleeve to help with your joint problems. There are many options on the market however I recommend obtaining a full knee Body Helix compression sleeve.  You can find information (including how to appropriate measure yourself for sizing) can be found at www.Body http://www.lambert.com/.  Many of these products are health savings account (HSA) eligible.   You can use the compression sleeve at any time throughout the day but is most important to use while being active as well as for 2 hours post-activity.   It is appropriate to ice following activity with the compression sleeve in place.   Recheck in 6 weeks.

## 2022-01-19 ENCOUNTER — Other Ambulatory Visit: Payer: Self-pay | Admitting: Family Medicine

## 2022-01-19 ENCOUNTER — Telehealth: Payer: Self-pay | Admitting: Family Medicine

## 2022-01-19 DIAGNOSIS — E039 Hypothyroidism, unspecified: Secondary | ICD-10-CM

## 2022-01-19 NOTE — Telephone Encounter (Signed)
Pt returned call for LAB RESULTS. Advised pt of note from PCP and CMA to inform pt to pick up a new prescription of levothyroxine at his pharmacy.   Also, scheduled a FASTING 4 week appt with provider.  Call pt to discuss further detail if needed: Pt ph [phone]

## 2022-01-22 NOTE — Progress Notes (Signed)
Right knee x-ray shows some arthritis changes

## 2022-01-29 ENCOUNTER — Encounter (HOSPITAL_COMMUNITY): Payer: Self-pay

## 2022-01-29 ENCOUNTER — Ambulatory Visit (HOSPITAL_COMMUNITY)
Admission: EM | Admit: 2022-01-29 | Discharge: 2022-01-29 | Disposition: A | Discharge status: Home / Self Care | Payer: Commercial Managed Care - HMO | Attending: Family Medicine | Admitting: Family Medicine

## 2022-01-29 DIAGNOSIS — H532 Diplopia: Secondary | ICD-10-CM | POA: Insufficient documentation

## 2022-01-29 LAB — CBC WITH DIFFERENTIAL/PLATELET
Abs Immature Granulocytes: 0.03 10*3/uL (ref 0.00–0.07)
Basophils Absolute: 0 10*3/uL (ref 0.0–0.1)
Basophils Relative: 0 %
Eosinophils Absolute: 0.1 10*3/uL (ref 0.0–0.5)
Eosinophils Relative: 1 %
HCT: 50.4 % (ref 39.0–52.0)
Hemoglobin: 17.2 g/dL — ABNORMAL HIGH (ref 13.0–17.0)
Immature Granulocytes: 0 %
Lymphocytes Relative: 15 %
Lymphs Abs: 1.5 10*3/uL (ref 0.7–4.0)
MCH: 30.2 pg (ref 26.0–34.0)
MCHC: 34.1 g/dL (ref 30.0–36.0)
MCV: 88.6 fL (ref 80.0–100.0)
Monocytes Absolute: 0.7 10*3/uL (ref 0.1–1.0)
Monocytes Relative: 7 %
Neutro Abs: 7.7 10*3/uL (ref 1.7–7.7)
Neutrophils Relative %: 77 %
Platelets: 210 10*3/uL (ref 150–400)
RBC: 5.69 MIL/uL (ref 4.22–5.81)
RDW: 13.2 % (ref 11.5–15.5)
WBC: 10.1 10*3/uL (ref 4.0–10.5)
nRBC: 0 % (ref 0.0–0.2)

## 2022-01-29 LAB — COMPREHENSIVE METABOLIC PANEL
ALT: 24 U/L (ref 0–44)
AST: 33 U/L (ref 15–41)
Albumin: 4.3 g/dL (ref 3.5–5.0)
Alkaline Phosphatase: 47 U/L (ref 38–126)
Anion gap: 18 — ABNORMAL HIGH (ref 5–15)
BUN: 14 mg/dL (ref 6–20)
CO2: 21 mmol/L — ABNORMAL LOW (ref 22–32)
Calcium: 9.8 mg/dL (ref 8.9–10.3)
Chloride: 99 mmol/L (ref 98–111)
Creatinine, Ser: 0.82 mg/dL (ref 0.61–1.24)
GFR, Estimated: >60 mL/min (ref >60)
Glucose, Bld: 107 mg/dL — ABNORMAL HIGH (ref 70–99)
Potassium: 3.5 mmol/L (ref 3.5–5.1)
Sodium: 138 mmol/L (ref 135–145)
Total Bilirubin: 1.6 mg/dL — ABNORMAL HIGH (ref 0.3–1.2)
Total Protein: 7.3 g/dL (ref 6.5–8.1)

## 2022-01-29 LAB — CBG MONITORING, ED: Glucose-Capillary: 124 mg/dL — ABNORMAL HIGH (ref 70–99)

## 2022-01-29 NOTE — ED Triage Notes (Signed)
Pt states having double vision for around a month. States saw his eye doctor on 11/16 and received a new eye glass rx with no changes. States has been through 4 different rx's with no relief. States his PCP encouraged him to come here d/t he can't drive.

## 2022-01-29 NOTE — Discharge Instructions (Signed)
Your sugar was 124.  We have drawn blood to check your blood counts, kidney and liver function and electrolytes, and a test for infection.  Will notify you if anything is significantly abnormal and needs treatment.  I think you probably need to see neurology and ophthalmology.  Please follow-up with your primary care and let them evaluate you to see what other studies you may need  If anything is worsening consider going to the emergency room for urgent evaluation

## 2022-01-29 NOTE — ED Provider Notes (Signed)
[facility]    CSN: [id number] Arrival date & time: 01/29/22  1450      History   Chief Complaint Chief Complaint  Patient presents with   Diplopia    HPI Steve Richards is a 56 y.o. male.   HPI Here for double vision; it has been going on for most of November.  He states that the images in each eye are sharp, but he sees 2 of everything.  He cannot tell me that it is worse in horizontal or vertical eye motion.  Has had some trouble with balance.  No nausea or vomiting or diarrhea.  No fever or chills.  He does have diabetes and last A1c was 7.1 in September.  He does not check his sugars at home.  Seen his optometrist since this began and they tried changing the prescription on his glasses without success.  He does have hypothyroidism and his dose was changed in October from 75 mcg to 88 mcg as his TSH was mildly elevated at 6.99  Past Medical History:  Diagnosis Date   Ascites 07/15/2019   Cellulitis and abscess of left lower extremity 07/21/2019   Chronic combined systolic (congestive) and diastolic (congestive) heart failure (HCC)    Cirrhosis of liver with ascites [facility])    Critical aortic valve stenosis    Hepatic cirrhosis ([place]) 07/15/2019   Hyperglycemia 07/21/2019   Left kidney mass 07/15/2019   Morbid obesity ([place])    Pleural effusion    Pleural effusion on right 07/15/2019   S/P aortic valve replacement with bioprosthetic valve 08/06/2019   Size 25 mm // Echocardiogram 8/21: EF 55-60, mod LVH, Gr 2 DD, normal RVSF, mod LAE, trivial MR, AVR w mean 13 mmHg and no PVL   Severe aortic stenosis    Splenomegaly 08/16/4313   Systolic ejection murmur 4/00/8676    Patient Active Problem List   Diagnosis Date Noted   Controlled type 2 diabetes mellitus with complication, without long-term current use of insulin ([place]) 11/21/2021   Refusal of statin medication by patient 11/21/2021   Vitamin D deficiency 11/20/2021   Elevated hemoglobin A1c 11/20/2021    Hypertriglyceridemia 11/20/2021   Hyperlipidemia associated with type 2 diabetes mellitus ([place]) 11/20/2021   Hypothyroidism 12/10/2019   S/P AVR 08/07/2019   S/P aortic valve replacement with bioprosthetic valve 08/06/2019   Aortic valve stenosis 08/06/2019   Severe aortic stenosis    New onset of congestive heart failure ([place]) 07/21/2019   Cellulitis and abscess of left lower extremity 07/21/2019   Hyperglycemia 07/21/2019   Obesity, Class III, BMI 40-49.9 (morbid obesity) ([place]) 07/21/2019   Ascites 07/15/2019   Hepatic cirrhosis ([place]) 07/15/2019   Pleural effusion on right 07/15/2019   Left kidney mass 07/15/2019   Splenomegaly 19/50/9326   Systolic ejection murmur 71/24/5809    Past Surgical History:  Procedure Laterality Date   AORTIC VALVE REPLACEMENT N/A 08/06/2019   Procedure: AORTIC VALVE REPLACEMENT (AVR) using Margaretha Sheffield Resilia 25 MM Aortic Valve.;  Surgeon: Gaye Pollack, MD;  Location: [facility];  Service: Open Heart Surgery;  Laterality: N/A;   CARDIAC CATHETERIZATION     EXPLORATION POST OPERATIVE OPEN HEART N/A 08/06/2019   Procedure: EXPLORATION POST OPERATIVE OPEN HEART;  Surgeon: Gaye Pollack, MD;  Location: [facility];  Service: Open Heart Surgery;  Laterality: N/A;   IR THORACENTESIS ASP PLEURAL SPACE W/IMG GUIDE  06/25/2019   RIGHT/LEFT HEART CATH AND CORONARY ANGIOGRAPHY N/A 07/28/2019   Procedure: RIGHT/LEFT HEART CATH AND  CORONARY ANGIOGRAPHY;  Surgeon: Sherren Mocha, MD;  Location: [facility];  Service: Cardiovascular;  Laterality: N/A;   TEE WITHOUT CARDIOVERSION N/A 08/06/2019   Procedure: TRANSESOPHAGEAL ECHOCARDIOGRAM (TEE);  Surgeon: Gaye Pollack, MD;  Location: [facility];  Service: Open Heart Surgery;  Laterality: N/A;   WISDOM TOOTH EXTRACTION     college age   59 FRACTURE SURGERY     grade school       Home Medications    Prior to Admission medications   Medication Sig Start Date End Date Taking? Authorizing Provider  levothyroxine  (SYNTHROID) 88 MCG tablet Take 1 tablet (88 mcg total) by mouth daily. 01/18/22   Henson, Vickie L, NP-C  acetaminophen (TYLENOL) 325 MG tablet Take 2 tablets (650 mg total) by mouth every 6 (six) hours as needed for mild pain. 08/17/19   Elgie Collard, PA-C  aspirin EC 81 MG tablet Take 1 tablet (81 mg total) by mouth daily. Swallow whole. 01/12/20   Jerline Pain, MD  metFORMIN (GLUCOPHAGE) 500 MG tablet Take 1 tablet (500 mg total) by mouth 2 (two) times daily with a meal. 11/21/21   Henson, Vickie L, NP-C  Multiple Vitamin (MULTIVITAMIN) tablet Take 1 tablet by mouth daily.    [provider]  sildenafil (VIAGRA) 100 MG tablet Take 1 tablet (100 mg total) by mouth daily as needed for erectile dysfunction. 07/19/20   Jerline Pain, MD  Vitamin D, Ergocalciferol, (DRISDOL) 1.25 MG (50000 UNIT) CAPS capsule Take 1 capsule (50,000 Units total) by mouth every 7 (seven) days. 11/21/21   Girtha Rm, NP-C    Family History Family History  Problem Relation Age of Onset   Osteoporosis Mother    Other Father        died in a hurricane    Social History Social History   Tobacco Use   Smoking status: Never   Smokeless tobacco: Never  Vaping Use   Vaping Use: Never used  Substance Use Topics   Alcohol use: Yes    Comment: rarely   Drug use: Never     Allergies   Patient has no known allergies.   Review of Systems Review of Systems   Physical Exam Triage Vital Signs ED Triage Vitals [01/29/22 1636]  Enc Vitals Group     BP (!) 162/96     Pulse Rate 87     Resp 18     Temp 98.4 F (36.9 C)     Temp Source Oral     SpO2 92 %     Weight      Height      Head Circumference      Peak Flow      Pain Score 0     Pain Loc      Pain Edu?      Excl. in [place]?    No data found.  Updated Vital Signs BP (!) 162/96 (BP Location: Right Arm)   Pulse 87   Temp 98.4 F (36.9 C) (Oral)   Resp 18   SpO2 92%   Visual Acuity Right Eye Distance: 20/70 Left Eye  Distance: 20/50 Bilateral Distance: 20/50 (pt states he can see the eye chart but everything is overlaping each other)  Right Eye Near:   Left Eye Near:    Bilateral Near:     Physical Exam Vitals reviewed.  Constitutional:      General: He is not in acute distress.    Appearance: He  is not ill-appearing, toxic-appearing or diaphoretic.  HENT:     Nose: Nose normal.     Mouth/Throat:     Mouth: Mucous membranes are moist.     Pharynx: No oropharyngeal exudate or posterior oropharyngeal erythema.  Eyes:     Conjunctiva/sclera: Conjunctivae normal.     Pupils: Pupils are equal, round, and reactive to light.     Comments: On horizontal gaze he has some nystagmus in both eyes.  Cardiovascular:     Rate and Rhythm: Normal rate and regular rhythm.  Pulmonary:     Effort: No respiratory distress.     Breath sounds: No stridor. No wheezing, rhonchi or rales.  Skin:    Capillary Refill: Capillary refill takes less than 2 seconds.     Coloration: Skin is not jaundiced or pale.  Neurological:     General: No focal deficit present.     Mental Status: He is alert and oriented to person, place, and time.  Psychiatric:        Behavior: Behavior normal.      UC Treatments / Results  Labs (all labs ordered are listed, but only abnormal results are displayed) Labs Reviewed  CBG MONITORING, ED - Abnormal; Notable for the following components:      Result Value   Glucose-Capillary 124 (*)    All other components within normal limits  CBC WITH DIFFERENTIAL/PLATELET  COMPREHENSIVE METABOLIC PANEL  RPR    EKG   Radiology No results found.  Procedures Procedures (including critical care time)  Medications Ordered in UC Medications - No data to display  Initial Impression / Assessment and Plan / UC Course  I have reviewed the triage vital signs and the nursing notes.  Pertinent labs & imaging results that were available during my care of the patient were reviewed by me and  considered in my medical decision making (see chart for details).        His sugar is 124.  Lab is drawn for CBC, CMP, and RPR.  I have given him contact information for ophthalmology and neurology.  I also think he needs to have an office visit with his primary care to begin evaluation.  We did discuss his going to the emergency room if anything acutely worsens. Final Clinical Impressions(s) / UC Diagnoses   Final diagnoses:  Diplopia     Discharge Instructions      Your sugar was 124.  We have drawn blood to check your blood counts, kidney and liver function and electrolytes, and a test for infection.  Will notify you if anything is significantly abnormal and needs treatment.  I think you probably need to see neurology and ophthalmology.  Please follow-up with your primary care and let them evaluate you to see what other studies you may need  If anything is worsening consider going to the emergency room for urgent evaluation     ED Prescriptions   None    PDMP not reviewed this encounter.   Barrett Henle, MD 01/29/22 [phone]

## 2022-01-30 ENCOUNTER — Encounter (HOSPITAL_COMMUNITY): Payer: Self-pay | Admitting: Emergency Medicine

## 2022-01-30 ENCOUNTER — Telehealth: Payer: Self-pay | Admitting: Family Medicine

## 2022-01-30 ENCOUNTER — Emergency Department (HOSPITAL_COMMUNITY)
Admission: EM | Admit: 2022-01-30 | Discharge: 2022-01-30 | Disposition: A | Discharge status: Home / Self Care | Payer: Commercial Managed Care - HMO | Attending: Emergency Medicine | Admitting: Emergency Medicine

## 2022-01-30 ENCOUNTER — Emergency Department (HOSPITAL_COMMUNITY): Payer: Commercial Managed Care - HMO

## 2022-01-30 ENCOUNTER — Other Ambulatory Visit: Payer: Self-pay

## 2022-01-30 DIAGNOSIS — Z7982 Long term (current) use of aspirin: Secondary | ICD-10-CM | POA: Insufficient documentation

## 2022-01-30 DIAGNOSIS — R251 Tremor, unspecified: Secondary | ICD-10-CM | POA: Insufficient documentation

## 2022-01-30 DIAGNOSIS — H539 Unspecified visual disturbance: Secondary | ICD-10-CM

## 2022-01-30 DIAGNOSIS — Z7984 Long term (current) use of oral hypoglycemic drugs: Secondary | ICD-10-CM | POA: Diagnosis not present

## 2022-01-30 DIAGNOSIS — H538 Other visual disturbances: Secondary | ICD-10-CM | POA: Insufficient documentation

## 2022-01-30 DIAGNOSIS — I509 Heart failure, unspecified: Secondary | ICD-10-CM | POA: Insufficient documentation

## 2022-01-30 DIAGNOSIS — E119 Type 2 diabetes mellitus without complications: Secondary | ICD-10-CM | POA: Diagnosis not present

## 2022-01-30 LAB — RPR: RPR Ser Ql: NONREACTIVE

## 2022-01-30 MED ORDER — LORAZEPAM 1 MG PO TABS: 0.5000 mg | ORAL_TABLET | Freq: Once | ORAL | Status: DC | PRN

## 2022-01-30 MED ORDER — GADOBUTROL 1 MMOL/ML IV SOLN
10.0000 mL | Freq: Once | INTRAVENOUS | Status: AC | PRN
  Administered 2022-01-30: 10 mL via INTRAVENOUS

## 2022-01-30 NOTE — Discharge Instructions (Signed)
You were seen in the emergency department for changes to your vision.  You need to be evaluated by an ophthalmologist.  I spoke with Dr. Nancy Fetter, the ophthalmologist at [facility] who is available to see you in clinic tomorrow.  I have provided her information to you including the address and phone number.  You may show up at the clinic at 8 AM or call ahead of time.  Please return to the emergency department for worsening symptoms.

## 2022-01-30 NOTE — Telephone Encounter (Signed)
Called pt and let him know that Steve Richards advises for him to go to the Emergency Room as this needs to be worked up further. Pt was unsure as he went to UC yesterday, I encouraged him that Steve Richards believes this needs to be worked up immediately. Pt verbalized understanding and states he will find a ride to take him.

## 2022-01-30 NOTE — ED Triage Notes (Signed)
Pt states his vision started changing in October- seen in November for new prescription for his glasses. 4 different prescriptions did not improve his vision.Pt states each eye sees distinct letters when only looking out of one. But when he looks out of both his vision is blurry/impaired. Pt states he has always been near sighted, but has on distance glasses on now. Pt was sent here for further eval.

## 2022-01-30 NOTE — ED Provider Triage Note (Signed)
Emergency Medicine Provider Triage Evaluation Note  Steve Richards , a 56 y.o. male  was evaluated in triage.  Pt complains of blurry vision.  Patient with history of blurry vision.  Patient states that this has been corrected previously with refractive lenses.  He states that his vision was normal up until the end of Halloween this year.  He does not really November he began to have blurry/nonemergent vision with his distant vision and states that he is having a hard time with his reading as well.  He has been evaluated by his eye doctor who has tried multiple new prescriptions with no relief.  He has also been seen by primary care who suggested maybe it was diabetic in nature.  He was sent to the emergency department for further evaluation.  He denies headaches, nausea, vomiting, chest pain, shortness of breath, other symptoms  Review of Systems  Positive: As above Negative: As above  Physical Exam  BP (!) 176/95 (BP Location: Right Leg)   Pulse 80   Temp 97.9 F (36.6 C)   Resp 15   SpO2 100%  Gen:   Awake, no distress   Resp:  Normal effort  MSK:   Moves extremities without difficulty  Other:    Medical Decision Making  Medically screening exam initiated at 12:12 PM.  Appropriate orders placed.  Steve Richards was informed that the remainder of the evaluation will be completed by another provider, this initial triage assessment does not replace that evaluation, and the importance of remaining in the ED until their evaluation is complete.     Dorothyann Peng, PA-C 01/30/22 1215

## 2022-01-30 NOTE — ED Provider Notes (Signed)
[facility] EMERGENCY DEPARTMENT Provider Note   CSN: [id number] Arrival date & time: 01/30/22  1041     History  Chief Complaint  Patient presents with   Loss of Vision    Steve Richards is a 56 y.o. male. With past medical history of hepatic cirrhosis, heart failure, obesity, diabetes who presents to the emergency department with change in vision.  States symptoms have been present for almost about 1 month.  He states that around mid October he had noticed that he was having difficulty with nearsightedness.  Describes having blurry vision.  He states that eventually at the beginning of November he was seen by an optometrist and had a new prescription ordered.  States over the past month he has had 4 new prescriptions and none of them have been able to correct his vision.  Additionally he has noticed since around the onset of his vision changes that he has had a mild tremor in his right hand.  He denies double vision.  He denies floaters or any complete vision loss.  He denies headache or head trauma.  Denies numbness or tingling to the face or extremities.  Denies weakness.  He was seen yesterday by an urgent care physician and had labs including RPR ordered and was instructed to follow-up with his primary care provider.  He was then phoned by internal medicine to present to the emergency department as it needed more urgent workup.  HPI     Home Medications Prior to Admission medications   Medication Sig Start Date End Date Taking? Authorizing Provider  levothyroxine (SYNTHROID) 88 MCG tablet Take 1 tablet (88 mcg total) by mouth daily. 01/18/22   Henson, Vickie L, NP-C  acetaminophen (TYLENOL) 325 MG tablet Take 2 tablets (650 mg total) by mouth every 6 (six) hours as needed for mild pain. 08/17/19   Elgie Collard, PA-C  aspirin EC 81 MG tablet Take 1 tablet (81 mg total) by mouth daily. Swallow whole. 01/12/20   Jerline Pain, MD  metFORMIN (GLUCOPHAGE) 500 MG tablet  Take 1 tablet (500 mg total) by mouth 2 (two) times daily with a meal. 11/21/21   Henson, Vickie L, NP-C  Multiple Vitamin (MULTIVITAMIN) tablet Take 1 tablet by mouth daily.    [provider]  sildenafil (VIAGRA) 100 MG tablet Take 1 tablet (100 mg total) by mouth daily as needed for erectile dysfunction. 07/19/20   Jerline Pain, MD  Vitamin D, Ergocalciferol, (DRISDOL) 1.25 MG (50000 UNIT) CAPS capsule Take 1 capsule (50,000 Units total) by mouth every 7 (seven) days. 11/21/21   Henson, Laurian Brim, NP-C      Allergies    Patient has no known allergies.    Review of Systems   Review of Systems  Eyes:  Positive for visual disturbance.  All other systems reviewed and are negative.   Physical Exam Updated Vital Signs BP (!) 133/94 (BP Location: Left Arm)   Pulse 69   Temp 98.3 F (36.8 C) (Oral)   Resp 16   SpO2 98%  Physical Exam Vitals and nursing note reviewed.  Constitutional:      General: He is not in acute distress.    Appearance: Normal appearance. He is not ill-appearing or toxic-appearing.  HENT:     Head: Normocephalic and atraumatic.     Mouth/Throat:     Mouth: Mucous membranes are moist.     Pharynx: Oropharynx is clear.  Eyes:     General: Vision grossly  intact. Visual field deficit present. No scleral icterus.    Extraocular Movements: Extraocular movements intact.     Right eye: Abnormal extraocular motion and nystagmus present.     Left eye: Abnormal extraocular motion and nystagmus present.     Pupils: Pupils are equal, round, and reactive to light.  Cardiovascular:     Rate and Rhythm: Normal rate and regular rhythm.     Pulses: Normal pulses.  Pulmonary:     Effort: Pulmonary effort is normal. No respiratory distress.  Musculoskeletal:     Cervical back: Normal range of motion and neck supple.  Skin:    General: Skin is warm and dry.     Capillary Refill: Capillary refill takes less than 2 seconds.  Neurological:     Mental Status: He is  alert and oriented to person, place, and time.     Cranial Nerves: No dysarthria or facial asymmetry.     Sensory: Sensation is intact.     Motor: Motor function is intact.     Comments: Right-sided field deficit of the right eye  Right sided horizontal fatigable nystagmus of both eyes Vertical nystagmus that is gaze induced   Psychiatric:        Mood and Affect: Mood normal.        Behavior: Behavior normal.     ED Results / Procedures / Treatments   Labs (all labs ordered are listed, but only abnormal results are displayed) Labs Reviewed - No data to display  EKG None  Radiology MR BRAIN W WO CONTRAST  Result Date: 01/30/2022 CLINICAL DATA:  Acute presentation with blurred vision. EXAM: MRI HEAD WITHOUT AND WITH CONTRAST TECHNIQUE: Multiplanar, multiecho pulse sequences of the brain and surrounding structures were obtained without and with intravenous contrast. CONTRAST:  61m GADAVIST GADOBUTROL 1 MMOL/ML IV SOLN COMPARISON:  None FINDINGS: Brain: The brain has a normal appearance without evidence of malformation, atrophy, old or acute small or large vessel infarction, mass lesion, hemorrhage, hydrocephalus or extra-axial collection. Cavernous sinus regions appear normal. No gross dysconjugate gaze. No suprasellar lesion. Mild arachnoid herniation into the sella. No abnormal contrast enhancement occurs. Vascular: Major vessels at the base of the brain show flow. Venous sinuses appear patent. Skull and upper cervical spine: Normal. Sinuses/Orbits: Mucosal inflammatory changes of the left maxillary sinus, left anterior ethmoid sinuses and left frontal sinus, consistent with ostiomeatal unit disease. Orbits themselves appear normal. Other: None significant. IMPRESSION: 1. Normal appearance of the brain itself. No abnormality seen to explain the presenting symptoms. Orbits appear normal as seen. 2. Left ostiomeatal unit pattern of sinusitis. Electronically Signed   By: MNelson ChimesM.D.   On:  01/30/2022 14:37    Procedures Procedures   Medications Ordered in ED Medications  LORazepam (ATIVAN) tablet 0.5 mg (has no administration in time range)  gadobutrol (GADAVIST) 1 MMOL/ML injection 10 mL (10 mLs Intravenous Contrast Given 01/30/22 1422)    ED Course/ Medical Decision Making/ A&P Clinical Course as of 01/30/22 2348  Tue Jan 30, 2022  2157 Dr. LCheral Markerconsulted and recommends ophthalmology  [LA]  2248 Spoke with Dr. SNancy Fetterwho will see him in AM in clinic  [LA]    Clinical Course User Index [LA] AMickie Hillier PA-C                           Medical Decision Making Initial Impression and Ddx 56year old well appearing male who presents for vision changes. Non-septic,  non-toxic in appearance. His exam is abnormal. There is evoked vertical nystagmus. Right sided fatigable nystagmus. Right sided visual field deficit to the right eye.  Patient PMH that increases complexity of ED encounter:  cirrhosis, CHF, obesity, AVR  Interpretation of Diagnostics I independent reviewed and interpreted the labs as followed: not indicated  - I independently visualized the following imaging with scope of interpretation limited to determining acute life threatening conditions related to emergency care: MR brain, which revealed no acute findings  Patient Reassessment and Ultimate Disposition/Management Imaging was resulted when I initially evaluated the patient. After exam I consulted and spoke with Dr. Cheral Marker, neurology.  He states that that given MRI is negative, likely not a brainstem lesion given vertical nystagmus. Could be labyrinth related and recommends ENT follow-up. Feels that monocular visual field deficit is likely focal to the eye itself and recommends ophthalmology consult.  Consulted and spoke with Dr. Nancy Fetter, ophthalmology. She is available to see the patient at 0800 tomorrow morning for evaluation. Patient can call or show up tomorrow morning. I went and discussed this with  patient and he is agreeable to this plan. Do not feel he needs further imaging or work up at this time. Will discharge to ophthalmology in the morning. I have given him return precautions for any worsening or new symptoms. He verbalizes understanding.   Patient management required discussion with the following services or consulting groups:  Neurology and Ophthalmology  Complexity of Problems Addressed Acute complicated illness or Injury  Additional Data Reviewed and Analyzed Further history obtained from: Prior ED visit notes and Care Everywhere  Patient Encounter Risk Assessment SDOH impact on management and Consideration of hospitalization  Final Clinical Impression(s) / ED Diagnoses Final diagnoses:  Vision disturbance    Rx / DC Orders ED Discharge Orders     None         Mickie Hillier, PA-C 01/30/22 2348    Valarie Merino, MD 01/31/22 0001

## 2022-01-30 NOTE — Telephone Encounter (Signed)
Pt called to report Loss of vision.  Pt went to [facility] yesterday. Labs are ready to view. Advise pt today [phone]

## 2022-01-31 ENCOUNTER — Telehealth: Payer: Self-pay

## 2022-01-31 ENCOUNTER — Encounter: Payer: Self-pay | Admitting: Family Medicine

## 2022-01-31 ENCOUNTER — Ambulatory Visit (INDEPENDENT_AMBULATORY_CARE_PROVIDER_SITE_OTHER): Payer: Commercial Managed Care - HMO | Admitting: Family Medicine

## 2022-01-31 VITALS — BP 138/82 | HR 77 | Temp 97.6°F | Ht 72.0 in | Wt 261.0 lb

## 2022-01-31 DIAGNOSIS — R270 Ataxia, unspecified: Secondary | ICD-10-CM

## 2022-01-31 DIAGNOSIS — E559 Vitamin D deficiency, unspecified: Secondary | ICD-10-CM | POA: Diagnosis not present

## 2022-01-31 DIAGNOSIS — E039 Hypothyroidism, unspecified: Secondary | ICD-10-CM

## 2022-01-31 DIAGNOSIS — E118 Type 2 diabetes mellitus with unspecified complications: Secondary | ICD-10-CM

## 2022-01-31 DIAGNOSIS — R4781 Slurred speech: Secondary | ICD-10-CM

## 2022-01-31 DIAGNOSIS — R4182 Altered mental status, unspecified: Secondary | ICD-10-CM

## 2022-01-31 DIAGNOSIS — R200 Anesthesia of skin: Secondary | ICD-10-CM

## 2022-01-31 DIAGNOSIS — R202 Paresthesia of skin: Secondary | ICD-10-CM

## 2022-01-31 DIAGNOSIS — K7460 Unspecified cirrhosis of liver: Secondary | ICD-10-CM

## 2022-01-31 LAB — POCT URINALYSIS DIPSTICK
Bilirubin, UA: NEGATIVE
Blood, UA: NEGATIVE
Glucose, UA: NEGATIVE
Ketones, UA: POSITIVE
Leukocytes, UA: NEGATIVE
Nitrite, UA: NEGATIVE
Protein, UA: POSITIVE — AB
Spec Grav, UA: >=1.03 — AB (ref 1.010–1.025)
Urobilinogen, UA: 0.2 E.U./dL
pH, UA: 6 (ref 5.0–8.0)

## 2022-01-31 LAB — CBC WITH DIFFERENTIAL/PLATELET
Basophils Absolute: 0 10*3/uL (ref 0.0–0.1)
Basophils Relative: 0.4 % (ref 0.0–3.0)
Eosinophils Absolute: 0.1 10*3/uL (ref 0.0–0.7)
Eosinophils Relative: 0.8 % (ref 0.0–5.0)
HCT: 48.9 % (ref 39.0–52.0)
Hemoglobin: 16.6 g/dL (ref 13.0–17.0)
Lymphocytes Relative: 16.6 % (ref 12.0–46.0)
Lymphs Abs: 1.2 10*3/uL (ref 0.7–4.0)
MCHC: 33.9 g/dL (ref 30.0–36.0)
MCV: 89.5 fl (ref 78.0–100.0)
Monocytes Absolute: 0.5 10*3/uL (ref 0.1–1.0)
Monocytes Relative: 7.1 % (ref 3.0–12.0)
Neutro Abs: 5.3 10*3/uL (ref 1.4–7.7)
Neutrophils Relative %: 75.1 % (ref 43.0–77.0)
Platelets: 199 10*3/uL (ref 150.0–400.0)
RBC: 5.46 Mil/uL (ref 4.22–5.81)
RDW: 13.9 % (ref 11.5–15.5)
WBC: 7.1 10*3/uL (ref 4.0–10.5)

## 2022-01-31 LAB — COMPREHENSIVE METABOLIC PANEL
ALT: 20 U/L (ref 0–53)
AST: 26 U/L (ref 0–37)
Albumin: 4.6 g/dL (ref 3.5–5.2)
Alkaline Phosphatase: 42 U/L (ref 39–117)
BUN: 9 mg/dL (ref 6–23)
CO2: 26 mEq/L (ref 19–32)
Calcium: 9.5 mg/dL (ref 8.4–10.5)
Chloride: 104 mEq/L (ref 96–112)
Creatinine, Ser: 0.8 mg/dL (ref 0.40–1.50)
GFR: 98.97 mL/min (ref >60.00)
Glucose, Bld: 108 mg/dL — ABNORMAL HIGH (ref 70–99)
Potassium: 3.8 mEq/L (ref 3.5–5.1)
Sodium: 139 mEq/L (ref 135–145)
Total Bilirubin: 1 mg/dL (ref 0.2–1.2)
Total Protein: 7.3 g/dL (ref 6.0–8.3)

## 2022-01-31 LAB — AMMONIA: Ammonia: 31 umol/L (ref 11–35)

## 2022-01-31 LAB — PROTIME-INR
INR: 1.1 ratio — ABNORMAL HIGH (ref 0.8–1.0)
Prothrombin Time: 11.6 s (ref 9.6–13.1)

## 2022-01-31 LAB — VITAMIN D 25 HYDROXY (VIT D DEFICIENCY, FRACTURES): VITD: 41.71 ng/mL (ref 30.00–100.00)

## 2022-01-31 LAB — VITAMIN B12: Vitamin B-12: 870 pg/mL (ref 211–911)

## 2022-01-31 LAB — FOLATE: Folate: >23.8 ng/mL (ref >5.9)

## 2022-01-31 LAB — POCT GLUCOSE (DEVICE FOR HOME USE): Glucose Fasting, POC: 110 mg/dL — AB (ref 70–99)

## 2022-01-31 NOTE — Telephone Encounter (Signed)
Transition Care Management Unsuccessful Follow-up Telephone Call  Date of discharge and from where:  [facility]  Attempts:  1st Attempt  Reason for unsuccessful TCM follow-up call:  No answer/busy Patient had office visit today 01/31/2022  with Vickie Henson,NP

## 2022-01-31 NOTE — Progress Notes (Signed)
Subjective:     Patient ID: Steve Richards, male    DOB: Jun 18, 1965, 56 y.o.   MRN: [id number]  Chief Complaint  Patient presents with   Loss of Vision    Eye dr would like to see if rule out medications, and if not medications possible stroke. No optic nerve or retina damage    HPI Patient is in today for altered mental status.  His friend Sharee Pimple is with him.   Hx of cirrhosis, CHF, controlled DM not on insulin and hypothyroidism.   Intermittent right arm twitching and numbness along with vision deficits x 12 days approximately.   Reports balance is off due to vision and pain in his right knee.   Speech is slower Cognition slightly slower to find the right word at times  Denies head injury or fall. No neck pain.   Denies alcohol, drug use or any new medications.   Denies fever, chills, night sweats, dizziness, headache, chest pain, palpitations, shortness of breath, abdominal pain, N/V/D, urinary symptoms.  Denies any skin changes. No new arthralgias or myalgias.     Health Maintenance Due  Topic Date Due   FOOT EXAM  Never done   OPHTHALMOLOGY EXAM  Never done   Diabetic kidney evaluation - Urine ACR  Never done   COVID-19 Vaccine (4 - 2023-24 season) 11/03/2021    Past Medical History:  Diagnosis Date   Ascites 07/15/2019   Cellulitis and abscess of left lower extremity 07/21/2019   Chronic combined systolic (congestive) and diastolic (congestive) heart failure (HCC)    Cirrhosis of liver with ascites (HCC)    Critical aortic valve stenosis    Hepatic cirrhosis ([place]) 07/15/2019   Hyperglycemia 07/21/2019   Left kidney mass 07/15/2019   Morbid obesity (HCC)    Pleural effusion    Pleural effusion on right 07/15/2019   S/P aortic valve replacement with bioprosthetic valve 08/06/2019   Size 25 mm // Echocardiogram 8/21: EF 55-60, mod LVH, Gr 2 DD, normal RVSF, mod LAE, trivial MR, AVR w mean 13 mmHg and no PVL   Severe aortic stenosis    Splenomegaly 8/67/6195    Systolic ejection murmur 0/93/2671    Past Surgical History:  Procedure Laterality Date   AORTIC VALVE REPLACEMENT N/A 08/06/2019   Procedure: AORTIC VALVE REPLACEMENT (AVR) using Margaretha Sheffield Resilia 25 MM Aortic Valve.;  Surgeon: Gaye Pollack, MD;  Location: [facility] OR;  Service: Open Heart Surgery;  Laterality: N/A;   CARDIAC CATHETERIZATION     EXPLORATION POST OPERATIVE OPEN HEART N/A 08/06/2019   Procedure: EXPLORATION POST OPERATIVE OPEN HEART;  Surgeon: Gaye Pollack, MD;  Location: [place];  Service: Open Heart Surgery;  Laterality: N/A;   IR THORACENTESIS ASP PLEURAL SPACE W/IMG GUIDE  06/25/2019   RIGHT/LEFT HEART CATH AND CORONARY ANGIOGRAPHY N/A 07/28/2019   Procedure: RIGHT/LEFT HEART CATH AND CORONARY ANGIOGRAPHY;  Surgeon: Sherren Mocha, MD;  Location: [place] [facility];  Service: Cardiovascular;  Laterality: N/A;   TEE WITHOUT CARDIOVERSION N/A 08/06/2019   Procedure: TRANSESOPHAGEAL ECHOCARDIOGRAM (TEE);  Surgeon: Gaye Pollack, MD;  Location: [place];  Service: Open Heart Surgery;  Laterality: N/A;   WISDOM TOOTH EXTRACTION     college age   21 FRACTURE SURGERY     grade school    Family History  Problem Relation Age of Onset   Osteoporosis Mother    Other Father        died in a hurricane    Social History  Socioeconomic History   Marital status: Single    Spouse name: Not on file   Number of children: Not on file   Years of education: 16   Highest education level: Bachelor's degree (e.g., BA, AB, BS)  Occupational History   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[employer]  Tobacco Use   Smoking status: Never   Smokeless tobacco: Never  Vaping Use   Vaping Use: Never used  Substance and Sexual Activity   Alcohol use: Yes    Comment: rarely   Drug use: Never   Sexual activity: Not on file  Other Topics Concern   Not on file  Social History Narrative   Right handed   Caffeine rare   Live in a two story apartment   Social Determinants of Health    Financial Resource Strain: Not on file  Food Insecurity: Not on file  Transportation Needs: Not on file  Physical Activity: Not on file  Stress: Not on file  Social Connections: Not on file  Intimate Partner Violence: Not on file    Outpatient Medications Prior to Visit  Medication Sig Dispense Refill   acetaminophen (TYLENOL) 325 MG tablet Take 2 tablets (650 mg total) by mouth every 6 (six) hours as needed for mild pain.     aspirin EC 81 MG tablet Take 1 tablet (81 mg total) by mouth daily. Swallow whole. 30 tablet 11   levothyroxine (SYNTHROID) 88 MCG tablet Take 1 tablet (88 mcg total) by mouth daily. 30 tablet 1   metFORMIN (GLUCOPHAGE) 500 MG tablet Take 1 tablet (500 mg total) by mouth 2 (two) times daily with a meal. 90 tablet 1   Multiple Vitamin (MULTIVITAMIN) tablet Take 1 tablet by mouth daily.     Vitamin D, Ergocalciferol, (DRISDOL) 1.25 MG (50000 UNIT) CAPS capsule Take 1 capsule (50,000 Units total) by mouth every 7 (seven) days. 12 capsule 0   sildenafil (VIAGRA) 100 MG tablet Take 1 tablet (100 mg total) by mouth daily as needed for erectile dysfunction. (Patient not taking: Reported on 01/31/2022) 10 tablet 6   No facility-administered medications prior to visit.    No Known Allergies  ROS     Objective:    Physical Exam Constitutional:      General: He is not in acute distress.    Appearance: He is obese.  HENT:     Right Ear: Hearing, tympanic membrane and ear canal normal.     Left Ear: Hearing, tympanic membrane and ear canal normal.     Nose: Nose normal.     Mouth/Throat:     Lips: Pink.     Mouth: Mucous membranes are moist.     Tongue: Tongue does not deviate from midline.     Pharynx: Oropharynx is clear. Uvula midline.  Eyes:     General: Lids are normal. Visual field deficit present.     Extraocular Movements: Extraocular movements intact.  Neck:     Thyroid: No thyromegaly or thyroid tenderness.  Cardiovascular:     Rate and Rhythm:  Normal rate and regular rhythm.     Pulses: Normal pulses.     Comments: Trace edema on LLE and 1+edema on RLE Pulmonary:     Effort: Pulmonary effort is normal.     Breath sounds: Normal breath sounds.  Abdominal:     General: Bowel sounds are normal.     Palpations: Abdomen is soft.     Tenderness: There is no abdominal tenderness.  Musculoskeletal:  Cervical back: Normal range of motion and neck supple. No tenderness.     Right lower leg: Edema present.     Left lower leg: 1+ Pitting Edema present.  Lymphadenopathy:     Cervical: No cervical adenopathy.  Skin:    General: Skin is warm and dry.     Capillary Refill: Capillary refill takes less than 2 seconds.     Coloration: Skin is not jaundiced or pale.     Findings: No rash.  Neurological:     General: No focal deficit present.     Mental Status: He is alert and oriented to person, place, and time.     Cranial Nerves: No facial asymmetry.     Sensory: No sensory deficit.     Motor: Tremor present. No weakness.     Coordination: Romberg sign negative. Coordination abnormal. Finger-Nose-Finger Test and Heel to [facility] Test normal.     Gait: Gait abnormal.     Deep Tendon Reflexes: Reflexes are normal and symmetric. Reflexes normal.     Comments: Chronic tremor or left per patient  Psychiatric:        Attention and Perception: Attention normal.        Mood and Affect: Mood normal.        Speech: Speech is delayed and slurred.        Behavior: Behavior is cooperative.        Thought Content: Thought content normal.     Comments: Speech slightly slurred and slowed     BP 138/82 (BP Location: Left Arm, Patient Position: Sitting, Cuff Size: Large)   Pulse 77   Temp 97.6 F (36.4 C) (Temporal)   Ht 6' (1.829 m)   Wt 261 lb (118.4 kg)   SpO2 99%   BMI 35.40 kg/m  Wt Readings from Last 3 Encounters:  01/31/22 261 lb (118.4 kg)  01/18/22 273 lb (123.8 kg)  01/12/22 268 lb (121.6 kg)        Assessment & Plan:    Problem List Items Addressed This Visit       Digestive   Hepatic cirrhosis ([place])   Relevant Orders   AFP tumor marker   Protime-INR   CBC with Differential/Platelet   Comprehensive metabolic panel   Ammonia   POCT urinalysis dipstick (Completed)     Endocrine   Controlled type 2 diabetes mellitus with complication, without long-term current use of insulin (HCC)   Relevant Orders   POCT Glucose (Device for Home Use) (Completed)   CBC with Differential/Platelet   Comprehensive metabolic panel   Vitamin Q03   Hypothyroidism   Relevant Orders   Thyroid Panel With TSH     Other   Vitamin D deficiency   Relevant Orders   VITAMIN D 25 Hydroxy (Vit-D Deficiency, Fractures)   Other Visit Diagnoses     Altered mental status, unspecified altered mental status type    -  Primary   Relevant Orders   AFP tumor marker   Protime-INR   Thyroid Panel With TSH   HIV Antibody (routine testing w rflx)   CBC with Differential/Platelet   Comprehensive metabolic panel   Vitamin K74   Vitamin B1   Ammonia   Acetylcholine receptor, binding   Striated muscle antibody   Folate   Urine drugs of abuse scrn w alc, routine ([facility] [place] [facility])   Ataxia       Relevant Orders   AFP tumor marker   Protime-INR   Thyroid Panel  With TSH   HIV Antibody (routine testing w rflx)   Vitamin B12   Vitamin B1   Ammonia   Numbness and tingling of right arm       Relevant Orders   Vitamin B12   Vitamin B1   Acetylcholine receptor, binding   Striated muscle antibody   Folate   Slurred speech       Relevant Orders   Acetylcholine receptor, binding   Striated muscle antibody   Urine drugs of abuse scrn w alc, routine ([facility] [place] [facility])      No acute distress.  No sign of acute infection.   Spoke with ophthalmologist Dr. Lucita Ferrara who evaluated him this morning.  Diagnosed with right hemianopsia.  Notes diplopia and loss of vision for the past several days  and no changes since ED visit yesterday.  Patient was worked in today due to altered mental state.  Reviewed ED and urgent care notes from yesterday including negative MRI brain.  Reviewed CTA abdomen pelvis showing cirrhosis of the liver in 07/2019.  His friend is with him today stating his speech is slow and he occasionally has difficulty finding his words.  He is walking cautiously due to vision issues and knee pain. Patient and friend deny any changes since being evaluated in the emergency department.  Urinalysis dipstick shows 2+ ketones otherwise negative. Point-of-care fasting glucose is 110.  He will hold metformin. Check labs including screening for myasthenia gravis, hepatic encephalopathy due to cirrhosis, check PT/INR and AFP tumor marker, vitamin deficiencies and UDS.  Strict precautions that if he notices any new or worsening symptoms he will call 911.   I am having [name] maintain his multivitamin, acetaminophen, aspirin EC, sildenafil, Vitamin D (Ergocalciferol), metFORMIN, and levothyroxine.  No orders of the defined types were placed in this encounter.

## 2022-01-31 NOTE — Patient Instructions (Signed)
Stop metformin until further notice.   We will be in touch with your results as they are available.   If you notice any new or worsening changes, call 911 or go to the closest emergency department.

## 2022-02-01 ENCOUNTER — Encounter: Payer: Commercial Managed Care - HMO | Admitting: Occupational Therapy

## 2022-02-01 LAB — URINE DRUGS OF ABUSE SCREEN W ALC, ROUTINE (REF LAB)
Amphetamines, Urine: NEGATIVE ng/mL
Barbiturate Quant, Ur: NEGATIVE ng/mL
Benzodiazepine Quant, Ur: NEGATIVE ng/mL
Cannabinoid Quant, Ur: NEGATIVE ng/mL
Cocaine (Metab.): NEGATIVE ng/mL
Ethanol, Urine: NEGATIVE %
Methadone Screen, Urine: NEGATIVE ng/mL
Opiate Quant, Ur: NEGATIVE ng/mL
PCP Quant, Ur: NEGATIVE ng/mL
Propoxyphene: NEGATIVE ng/mL

## 2022-02-02 NOTE — Telephone Encounter (Signed)
Patient has an office visit appt with Harland Dingwall on 02/15/2022

## 2022-02-06 LAB — HIV ANTIBODY (ROUTINE TESTING W REFLEX): HIV 1&2 Ab, 4th Generation: NONREACTIVE

## 2022-02-06 LAB — THYROID PANEL WITH TSH
Free Thyroxine Index: 2.3 (ref 1.4–3.8)
T3 Uptake: 29 % (ref 22–35)
T4, Total: 7.8 ug/dL (ref 4.9–10.5)
TSH: 4.19 mIU/L (ref 0.40–4.50)

## 2022-02-06 LAB — ACETYLCHOLINE RECEPTOR, BINDING: A CHR BINDING ABS: <0.3 nmol/L

## 2022-02-06 LAB — VITAMIN B1: Vitamin B1 (Thiamine): 27 nmol/L (ref 8–30)

## 2022-02-06 LAB — STRIATED MUSCLE ANTIBODY: STRIATED MUSCLE AB SCREEN: NEGATIVE

## 2022-02-06 LAB — AFP TUMOR MARKER: AFP-Tumor Marker: 2.8 ng/mL (ref <6.1)

## 2022-02-06 NOTE — Progress Notes (Deleted)
I saw Steve Richards in neurology clinic on 02/08/22 in follow up for left hand weakness.  HPI: Steve Richards is a 56 y.o. year old male with a history of DM2, hypothyroidism, HLD, CHF, aortic stenosis s/p AVR (2021, not on [facility]), cirrhosis  who we last saw on 11/02/21.  To briefly review: Around Christmas of 2022, patient had acute, sharp localized pain near the spine of the scapula on the left. He is not sure if he woke up with the pain or not. He had the pain for 2-6 weeks. He rates the pain to about 6/10. After 3-4 months, he started noticing difficulty extending his left wrist. Around 06/2021 patient had weakness in the left hand. He is not sure if it is improving, but does not seem to be getting worse. Patient is concerned his arm is starting to atrophy.    He does not remember a preceding viral illness. He had no vaccines since the fall of 2022. He does not remember sleeping or waking up in an awkward position.   He had no symptoms in other limbs. He had a previous pinched nerve after falling onto his left shoulder around 8 years. He denies prior wrist drop or foot drop.   Patient saw his PCP, Dr. Orland Mustard on 07/27/21 for left hand weakness that had been present for about 6 months after a "pinched nerve in the neck." He had difficulty flexing fingers or wrist. Cervical spine xray showed mild to moderate left foraminal narrowing at C3/4, C5/6, and C6/7. Patient was referred to neurology for further evaluation.   He denies any constitutional symptoms like fever, night sweats, anorexia or unintentional weight loss.   EtOH use: Rare, 1 drink  Restrictive diet? No Family history of neuropathy/myopathy/NM disease? No   Patient has not had an EMG and not had any medications for symptoms.   Patient is a massage therapist.  Most recent Assessment and Plan (11/02/21): His neurological examination is pertinent for weakness of finger extensors, flexors, abductors, and opposition. Triceps reflex was  asymmetric (absent on left). Available diagnostic data is significant for cervical xray showing mild to moderate left foraminal narrowing from C3-4 to C6-7.    His examination points to a C8 (?C7) distribution. Cervical radiculopathy is felt to be less likely though due to the absence of neck pain and radiation into arm. The pain followed by weakness and atrophy is more consistent with neuralgic amyotrophy, which may affect the brachial plexus or nerve roots.   PLAN: -Blood work: CBC, CMP, TSH -EMG: LUE (BP protocol) -Occupational therapy for left hand weakness -Supplement Vit D, 1000 IU daily  Since their last visit: Patient went to urgent care on 01/29/22 for diplopia (resolved with closing one eye) since the beginning of 01/2022. He returned to the ED on 01/30/22 for vision loss (denied double vision). He also mentioned a tremor in his right hand. In the ED, patient was found to have right sided field deficit of the right eye. MRI brain w/wo contrast showed no acute process to explain symptoms.  Patient was seen by ophtho who found no optic nerve or retina problems.  *** Labs were significant for elevated TSH (16.05) Patient was to have EMG on 12/25/21 but cancelled this appointment.  ROS: Pertinent positive and negative systems reviewed in HPI. ***   MEDICATIONS:  Outpatient Encounter Medications as of 02/08/2022  Medication Sig   acetaminophen (TYLENOL) 325 MG tablet Take 2 tablets (650 mg total) by mouth every 6 (six) hours  as needed for mild pain.   aspirin EC 81 MG tablet Take 1 tablet (81 mg total) by mouth daily. Swallow whole.   levothyroxine (SYNTHROID) 88 MCG tablet Take 1 tablet (88 mcg total) by mouth daily.   metFORMIN (GLUCOPHAGE) 500 MG tablet Take 1 tablet (500 mg total) by mouth 2 (two) times daily with a meal.   Multiple Vitamin (MULTIVITAMIN) tablet Take 1 tablet by mouth daily.   sildenafil (VIAGRA) 100 MG tablet Take 1 tablet (100 mg total) by mouth daily as  needed for erectile dysfunction. (Patient not taking: Reported on 01/31/2022)   Vitamin D, Ergocalciferol, (DRISDOL) 1.25 MG (50000 UNIT) CAPS capsule Take 1 capsule (50,000 Units total) by mouth every 7 (seven) days.   No facility-administered encounter medications on file as of 02/08/2022.    PAST MEDICAL HISTORY: Past Medical History:  Diagnosis Date   Ascites 07/15/2019   Cellulitis and abscess of left lower extremity 07/21/2019   Chronic combined systolic (congestive) and diastolic (congestive) heart failure (HCC)    Cirrhosis of liver with ascites (HCC)    Critical aortic valve stenosis    Hepatic cirrhosis ([place]) 07/15/2019   Hyperglycemia 07/21/2019   Left kidney mass 07/15/2019   Morbid obesity (HCC)    Pleural effusion    Pleural effusion on right 07/15/2019   S/P aortic valve replacement with bioprosthetic valve 08/06/2019   Size 25 mm // Echocardiogram 8/21: EF 55-60, mod LVH, Gr 2 DD, normal RVSF, mod LAE, trivial MR, AVR w mean 13 mmHg and no PVL   Severe aortic stenosis    Splenomegaly 3/78/5885   Systolic ejection murmur 0/27/7412    PAST SURGICAL HISTORY: Past Surgical History:  Procedure Laterality Date   AORTIC VALVE REPLACEMENT N/A 08/06/2019   Procedure: AORTIC VALVE REPLACEMENT (AVR) using Margaretha Sheffield Resilia 25 MM Aortic Valve.;  Surgeon: Gaye Pollack, MD;  Location: [facility];  Service: Open Heart Surgery;  Laterality: N/A;   CARDIAC CATHETERIZATION     EXPLORATION POST OPERATIVE OPEN HEART N/A 08/06/2019   Procedure: EXPLORATION POST OPERATIVE OPEN HEART;  Surgeon: Gaye Pollack, MD;  Location: [place];  Service: Open Heart Surgery;  Laterality: N/A;   IR THORACENTESIS ASP PLEURAL SPACE W/IMG GUIDE  06/25/2019   RIGHT/LEFT HEART CATH AND CORONARY ANGIOGRAPHY N/A 07/28/2019   Procedure: RIGHT/LEFT HEART CATH AND CORONARY ANGIOGRAPHY;  Surgeon: Sherren Mocha, MD;  Location: [place] [facility];  Service: Cardiovascular;  Laterality: N/A;   TEE WITHOUT CARDIOVERSION  N/A 08/06/2019   Procedure: TRANSESOPHAGEAL ECHOCARDIOGRAM (TEE);  Surgeon: Gaye Pollack, MD;  Location: [place];  Service: Open Heart Surgery;  Laterality: N/A;   WISDOM TOOTH EXTRACTION     college age   43 FRACTURE SURGERY     grade school    ALLERGIES: No Known Allergies  FAMILY HISTORY: Family History  Problem Relation Age of Onset   Osteoporosis Mother    Other Father        died in a hurricane    SOCIAL HISTORY: Social History   Tobacco Use   Smoking status: Never   Smokeless tobacco: Never  Vaping Use   Vaping Use: Never used  Substance Use Topics   Alcohol use: Yes    Comment: rarely   Drug use: Never   Social History   Social History Narrative   Right handed   Caffeine rare   Live in a two story apartment    Objective:  Vital Signs:  There were no vitals taken  for this visit.  General:*** General appearance: Awake and alert. No distress. Cooperative with exam. Skin: No rash or jaundice. HEENT: Atraumatic. Anicteric. Lungs: Non-labored breathing on room air  Heart: Regular Abdomen: Soft, non tender. Extremities: No edema. No obvious deformity.  Musculoskeletal: No obvious joint swelling.  Neurological: Mental Status: Alert. Speech fluent. No pseudobulbar affect Cranial Nerves: CNII: No RAPD. Visual fields intact. CNIII, IV, VI: PERRL. No nystagmus. EOMI. CN V: Facial sensation intact bilaterally to fine touch. [place] clench strong. Jaw jerk***. CN VII: Facial muscles symmetric and strong. No ptosis at rest or after sustained upgaze***. CN VIII: Hears finger rub well bilaterally. CN IX: No hypophonia. CN X: Palate elevates symmetrically. CN XI: Full strength shoulder shrug bilaterally. CN XII: Tongue protrusion full and midline. No atrophy or fasciculations. No significant dysarthria*** Motor: Tone is ***. *** fasciculations in *** extremities. *** atrophy. No grip or percussive myotonia.  Individual muscle group testing (MRC grade  out of 5):  Movement     Neck flexion ***    Neck extension ***     Right Left   Shoulder abduction *** ***   Shoulder adduction *** ***   Shoulder ext rotation *** ***   Shoulder int rotation *** ***   Elbow flexion *** ***   Elbow extension *** ***   Wrist extension *** ***   Wrist flexion *** ***   Finger abduction - FDI *** ***   Finger abduction - ADM *** ***   Finger extension *** ***   Finger distal flexion - 2/3 *** ***   Finger distal flexion - 4/5 *** ***   Thumb flexion - FPL *** ***   Thumb abduction - APB *** ***    Hip flexion *** ***   Hip extension *** ***   Hip adduction *** ***   Hip abduction *** ***   Knee extension *** ***   Knee flexion *** ***   Dorsiflexion *** ***   Plantarflexion *** ***   Inversion *** ***   Eversion *** ***   Great toe extension *** ***   Great toe flexion *** ***     Reflexes:  Right Left  Bicep *** ***  Tricep *** ***  BrRad *** ***  Knee *** ***  Ankle *** ***   Pathological Reflexes: Babinski: *** response bilaterally*** Hoffman: *** Troemner: *** Pectoral: *** Palmomental: *** Facial: *** Midline tap: *** Sensation: Pinprick: *** Vibration: *** Temperature: *** Proprioception: *** Coordination: Intact finger-to- nose-finger and heel-to-shin bilaterally. Romberg negative.*** Gait: Able to rise from chair with arms crossed unassisted. Normal, narrow-based gait. Able to tandem walk. Able to walk on toes and heels.***   Lab and Test Review: New results: 11/02/21:  TSH 16.05 CMP and CBC wnl  11/16/21: Vit D low at 17.73 A1c: 7.1  RPR (01/29/22): nonreactive  01/31/22: B12 870 Ammonia wnl Vit D wnl, folate wnl  MRI brain w/wo contrast (01/30/22): FINDINGS: Brain: The brain has a normal appearance without evidence of malformation, atrophy, old or acute small or large vessel infarction, mass lesion, hemorrhage, hydrocephalus or extra-axial collection. Cavernous sinus regions appear normal. No  gross dysconjugate gaze. No suprasellar lesion. Mild arachnoid herniation into the sella. No abnormal contrast enhancement occurs.   Vascular: Major vessels at the base of the brain show flow. Venous sinuses appear patent.   Skull and upper cervical spine: Normal.   Sinuses/Orbits: Mucosal inflammatory changes of the left maxillary sinus, left anterior ethmoid sinuses and left frontal sinus, consistent with ostiomeatal  unit disease. Orbits themselves appear normal.   Other: None significant.   IMPRESSION: 1. Normal appearance of the brain itself. No abnormality seen to explain the presenting symptoms. Orbits appear normal as seen. 2. Left ostiomeatal unit pattern of sinusitis.  Previous results: 02/17/21: B12: 885 Folate: >23.4 Vit D low at 23.61 TSH: 4.77 HbA1c 6.6 CBC and CMP unremarkable   Cervical spine xray (07/27/21): FINDINGS: No acute fracture or malalignment identified in the cervical spine. Moderate intervertebral disc space narrowing at C5-C6 and C6-C7 with small dorsal endplate osteophytes. Bilateral facet arthropathy, worse on the left. Mild-to-moderate left neural foraminal narrowing at C3-C4, C5-C6 and C6-C7. No prevertebral soft tissue swelling.   IMPRESSION: Degenerative changes of the cervical spine as described.  ASSESSMENT: This is Prentiss Polio, a 56 y.o. male with:  ***  Plan: ***  Return to clinic in ***  Total time spent reviewing records, interview, history/exam, documentation, and coordination of care on day of encounter:  *** min  Kai Levins, MD

## 2022-02-07 NOTE — Progress Notes (Signed)
Your labs are all negative. Nothing seen here to explain your recent symptoms. Please reach out and let us know how you are doing

## 2022-02-08 ENCOUNTER — Encounter: Payer: Self-pay | Admitting: Neurology

## 2022-02-08 ENCOUNTER — Emergency Department (HOSPITAL_COMMUNITY): Payer: Commercial Managed Care - HMO

## 2022-02-08 ENCOUNTER — Other Ambulatory Visit: Payer: Self-pay

## 2022-02-08 ENCOUNTER — Ambulatory Visit (INDEPENDENT_AMBULATORY_CARE_PROVIDER_SITE_OTHER): Payer: Commercial Managed Care - HMO | Admitting: Family Medicine

## 2022-02-08 ENCOUNTER — Encounter: Payer: Self-pay | Admitting: Family Medicine

## 2022-02-08 ENCOUNTER — Ambulatory Visit: Payer: 59 | Admitting: Neurology

## 2022-02-08 ENCOUNTER — Encounter: Payer: Commercial Managed Care - HMO | Admitting: Occupational Therapy

## 2022-02-08 ENCOUNTER — Inpatient Hospital Stay (HOSPITAL_COMMUNITY)
Admission: EM | Admit: 2022-02-08 | Discharge: 2022-02-23 | DRG: 056 | Disposition: A | Discharge status: Hospice | Payer: Commercial Managed Care - HMO | Attending: Family Medicine | Admitting: Family Medicine

## 2022-02-08 VITALS — BP 142/86 | HR 90 | Temp 97.6°F | Ht 72.0 in | Wt 261.0 lb

## 2022-02-08 DIAGNOSIS — R471 Dysarthria and anarthria: Secondary | ICD-10-CM | POA: Diagnosis present

## 2022-02-08 DIAGNOSIS — Z029 Encounter for administrative examinations, unspecified: Secondary | ICD-10-CM

## 2022-02-08 DIAGNOSIS — R270 Ataxia, unspecified: Secondary | ICD-10-CM | POA: Diagnosis present

## 2022-02-08 DIAGNOSIS — H5340 Unspecified visual field defects: Secondary | ICD-10-CM | POA: Diagnosis present

## 2022-02-08 DIAGNOSIS — R4182 Altered mental status, unspecified: Secondary | ICD-10-CM | POA: Diagnosis not present

## 2022-02-08 DIAGNOSIS — Z6835 Body mass index (BMI) 35.0-35.9, adult: Secondary | ICD-10-CM

## 2022-02-08 DIAGNOSIS — Z515 Encounter for palliative care: Secondary | ICD-10-CM

## 2022-02-08 DIAGNOSIS — E6609 Other obesity due to excess calories: Secondary | ICD-10-CM

## 2022-02-08 DIAGNOSIS — Z953 Presence of xenogenic heart valve: Secondary | ICD-10-CM

## 2022-02-08 DIAGNOSIS — E118 Type 2 diabetes mellitus with unspecified complications: Secondary | ICD-10-CM | POA: Diagnosis present

## 2022-02-08 DIAGNOSIS — A8100 Creutzfeldt-Jakob disease, unspecified: Secondary | ICD-10-CM | POA: Diagnosis not present

## 2022-02-08 DIAGNOSIS — I2510 Atherosclerotic heart disease of native coronary artery without angina pectoris: Secondary | ICD-10-CM | POA: Diagnosis present

## 2022-02-08 DIAGNOSIS — G4733 Obstructive sleep apnea (adult) (pediatric): Secondary | ICD-10-CM | POA: Diagnosis present

## 2022-02-08 DIAGNOSIS — E669 Obesity, unspecified: Secondary | ICD-10-CM | POA: Diagnosis present

## 2022-02-08 DIAGNOSIS — I609 Nontraumatic subarachnoid hemorrhage, unspecified: Secondary | ICD-10-CM

## 2022-02-08 DIAGNOSIS — R251 Tremor, unspecified: Secondary | ICD-10-CM

## 2022-02-08 DIAGNOSIS — D751 Secondary polycythemia: Secondary | ICD-10-CM | POA: Diagnosis present

## 2022-02-08 DIAGNOSIS — G252 Other specified forms of tremor: Secondary | ICD-10-CM | POA: Diagnosis present

## 2022-02-08 DIAGNOSIS — Z66 Do not resuscitate: Secondary | ICD-10-CM | POA: Diagnosis not present

## 2022-02-08 DIAGNOSIS — R52 Pain, unspecified: Secondary | ICD-10-CM

## 2022-02-08 DIAGNOSIS — H539 Unspecified visual disturbance: Secondary | ICD-10-CM

## 2022-02-08 DIAGNOSIS — Z79899 Other long term (current) drug therapy: Secondary | ICD-10-CM

## 2022-02-08 DIAGNOSIS — E039 Hypothyroidism, unspecified: Secondary | ICD-10-CM | POA: Diagnosis present

## 2022-02-08 DIAGNOSIS — Y92239 Unspecified place in hospital as the place of occurrence of the external cause: Secondary | ICD-10-CM | POA: Diagnosis not present

## 2022-02-08 DIAGNOSIS — Z7982 Long term (current) use of aspirin: Secondary | ICD-10-CM

## 2022-02-08 DIAGNOSIS — E1141 Type 2 diabetes mellitus with diabetic mononeuropathy: Secondary | ICD-10-CM | POA: Diagnosis present

## 2022-02-08 DIAGNOSIS — H5347 Heteronymous bilateral field defects: Principal | ICD-10-CM | POA: Diagnosis present

## 2022-02-08 DIAGNOSIS — H53461 Homonymous bilateral field defects, right side: Secondary | ICD-10-CM | POA: Diagnosis present

## 2022-02-08 DIAGNOSIS — W19XXXA Unspecified fall, initial encounter: Secondary | ICD-10-CM | POA: Diagnosis not present

## 2022-02-08 DIAGNOSIS — K7581 Nonalcoholic steatohepatitis (NASH): Secondary | ICD-10-CM | POA: Diagnosis present

## 2022-02-08 DIAGNOSIS — R4781 Slurred speech: Secondary | ICD-10-CM | POA: Diagnosis not present

## 2022-02-08 DIAGNOSIS — H532 Diplopia: Secondary | ICD-10-CM | POA: Diagnosis present

## 2022-02-08 DIAGNOSIS — G253 Myoclonus: Secondary | ICD-10-CM | POA: Diagnosis present

## 2022-02-08 DIAGNOSIS — I5042 Chronic combined systolic (congestive) and diastolic (congestive) heart failure: Secondary | ICD-10-CM | POA: Diagnosis present

## 2022-02-08 DIAGNOSIS — R4701 Aphasia: Secondary | ICD-10-CM | POA: Diagnosis present

## 2022-02-08 DIAGNOSIS — Z8262 Family history of osteoporosis: Secondary | ICD-10-CM

## 2022-02-08 DIAGNOSIS — H547 Unspecified visual loss: Secondary | ICD-10-CM | POA: Diagnosis present

## 2022-02-08 DIAGNOSIS — R9401 Abnormal electroencephalogram [EEG]: Secondary | ICD-10-CM | POA: Diagnosis present

## 2022-02-08 DIAGNOSIS — R0600 Dyspnea, unspecified: Secondary | ICD-10-CM

## 2022-02-08 DIAGNOSIS — R2990 Unspecified symptoms and signs involving the nervous system: Secondary | ICD-10-CM | POA: Diagnosis present

## 2022-02-08 DIAGNOSIS — K7460 Unspecified cirrhosis of liver: Secondary | ICD-10-CM | POA: Diagnosis present

## 2022-02-08 LAB — DIFFERENTIAL
Abs Immature Granulocytes: 0.02 10*3/uL (ref 0.00–0.07)
Basophils Absolute: 0 10*3/uL (ref 0.0–0.1)
Basophils Relative: 1 %
Eosinophils Absolute: 0.1 10*3/uL (ref 0.0–0.5)
Eosinophils Relative: 1 %
Immature Granulocytes: 0 %
Lymphocytes Relative: 19 %
Lymphs Abs: 1.6 10*3/uL (ref 0.7–4.0)
Monocytes Absolute: 0.7 10*3/uL (ref 0.1–1.0)
Monocytes Relative: 8 %
Neutro Abs: 5.8 10*3/uL (ref 1.7–7.7)
Neutrophils Relative %: 71 %

## 2022-02-08 LAB — PROTIME-INR
INR: 1 (ref 0.8–1.2)
Prothrombin Time: 12.8 seconds (ref 11.4–15.2)

## 2022-02-08 LAB — COMPREHENSIVE METABOLIC PANEL
ALT: 31 U/L (ref 0–44)
AST: 42 U/L — ABNORMAL HIGH (ref 15–41)
Albumin: 4.1 g/dL (ref 3.5–5.0)
Alkaline Phosphatase: 49 U/L (ref 38–126)
Anion gap: 12 (ref 5–15)
BUN: 9 mg/dL (ref 6–20)
CO2: 23 mmol/L (ref 22–32)
Calcium: 9.2 mg/dL (ref 8.9–10.3)
Chloride: 100 mmol/L (ref 98–111)
Creatinine, Ser: 0.85 mg/dL (ref 0.61–1.24)
GFR, Estimated: >60 mL/min (ref >60)
Glucose, Bld: 118 mg/dL — ABNORMAL HIGH (ref 70–99)
Potassium: 3.9 mmol/L (ref 3.5–5.1)
Sodium: 135 mmol/L (ref 135–145)
Total Bilirubin: 1 mg/dL (ref 0.3–1.2)
Total Protein: 7.2 g/dL (ref 6.5–8.1)

## 2022-02-08 LAB — I-STAT CHEM 8, ED
BUN: 11 mg/dL (ref 6–20)
Calcium, Ion: 1.1 mmol/L — ABNORMAL LOW (ref 1.15–1.40)
Chloride: 102 mmol/L (ref 98–111)
Creatinine, Ser: 0.8 mg/dL (ref 0.61–1.24)
Glucose, Bld: 118 mg/dL — ABNORMAL HIGH (ref 70–99)
HCT: 55 % — ABNORMAL HIGH (ref 39.0–52.0)
Hemoglobin: 18.7 g/dL — ABNORMAL HIGH (ref 13.0–17.0)
Potassium: 3.9 mmol/L (ref 3.5–5.1)
Sodium: 138 mmol/L (ref 135–145)
TCO2: 24 mmol/L (ref 22–32)

## 2022-02-08 LAB — APTT: aPTT: 31 seconds (ref 24–36)

## 2022-02-08 LAB — CBC
HCT: 52.7 % — ABNORMAL HIGH (ref 39.0–52.0)
Hemoglobin: 17.8 g/dL — ABNORMAL HIGH (ref 13.0–17.0)
MCH: 30.2 pg (ref 26.0–34.0)
MCHC: 33.8 g/dL (ref 30.0–36.0)
MCV: 89.5 fL (ref 80.0–100.0)
Platelets: 202 10*3/uL (ref 150–400)
RBC: 5.89 MIL/uL — ABNORMAL HIGH (ref 4.22–5.81)
RDW: 13.2 % (ref 11.5–15.5)
WBC: 8.2 10*3/uL (ref 4.0–10.5)
nRBC: 0 % (ref 0.0–0.2)

## 2022-02-08 LAB — ETHANOL: Alcohol, Ethyl (B): <10 mg/dL (ref <10)

## 2022-02-08 NOTE — Progress Notes (Signed)
Subjective:     Patient ID: Steve Richards, male    DOB: 1965/07/17, 56 y.o.   MRN: [id number]  Chief Complaint  Patient presents with   Follow-up    Intermittent twitching moved down leg and foot on right side    HPI Patient is in today for follow up on recent altered mental status.  His friend Sharee Pimple is with him today.   Hx of cirrhosis, CHF, controlled DM not on insulin and hypothyroidism.   Stopped all medication at his previous visit on 01/31/2022 to see if this was a possible cause for symptoms and he is worse.   He was having intermittent right arm twitching and numbness along with vision deficits  symptoms are worsening and he is having twitching in his right leg as well which is new.    Difficulty ambulating today.    Speech is slower and slurred.  Cognition slightly slower to find the right word at times   Denies head injury or fall. No neck pain.    Denies alcohol, drug use or any new medications.    Denies fever, chills, headache, chest pain, palpitations, shortness of breath, abdominal pain, N/V/D, urinary symptoms.  Denies any skin changes. No new arthralgias or myalgias.     Health Maintenance Due  Topic Date Due   FOOT EXAM  Never done   OPHTHALMOLOGY EXAM  Never done   Diabetic kidney evaluation - Urine ACR  Never done   DTaP/Tdap/Td (1 - Tdap) Never done    Past Medical History:  Diagnosis Date   Ascites 07/15/2019   Cellulitis and abscess of left lower extremity 07/21/2019   Chronic combined systolic (congestive) and diastolic (congestive) heart failure (HCC)    Cirrhosis of liver with ascites (HCC)    Critical aortic valve stenosis    Hepatic cirrhosis ([place]) 07/15/2019   Hyperglycemia 07/21/2019   Left kidney mass 07/15/2019   Morbid obesity (HCC)    Pleural effusion    Pleural effusion on right 07/15/2019   S/P aortic valve replacement with bioprosthetic valve 08/06/2019   Size 25 mm // Echocardiogram 8/21: EF 55-60, mod LVH, Gr 2 DD, normal RVSF,  mod LAE, trivial MR, AVR w mean 13 mmHg and no PVL   Severe aortic stenosis    Splenomegaly 0/10/6759   Systolic ejection murmur 9/50/9326    Past Surgical History:  Procedure Laterality Date   AORTIC VALVE REPLACEMENT N/A 08/06/2019   Procedure: AORTIC VALVE REPLACEMENT (AVR) using Margaretha Sheffield Resilia 25 MM Aortic Valve.;  Surgeon: Gaye Pollack, MD;  Location: [facility];  Service: Open Heart Surgery;  Laterality: N/A;   CARDIAC CATHETERIZATION     EXPLORATION POST OPERATIVE OPEN HEART N/A 08/06/2019   Procedure: EXPLORATION POST OPERATIVE OPEN HEART;  Surgeon: Gaye Pollack, MD;  Location: [place];  Service: Open Heart Surgery;  Laterality: N/A;   IR THORACENTESIS ASP PLEURAL SPACE W/IMG GUIDE  06/25/2019   RIGHT/LEFT HEART CATH AND CORONARY ANGIOGRAPHY N/A 07/28/2019   Procedure: RIGHT/LEFT HEART CATH AND CORONARY ANGIOGRAPHY;  Surgeon: Sherren Mocha, MD;  Location: [place] [facility];  Service: Cardiovascular;  Laterality: N/A;   TEE WITHOUT CARDIOVERSION N/A 08/06/2019   Procedure: TRANSESOPHAGEAL ECHOCARDIOGRAM (TEE);  Surgeon: Gaye Pollack, MD;  Location: [place];  Service: Open Heart Surgery;  Laterality: N/A;   WISDOM TOOTH EXTRACTION     college age   21 FRACTURE SURGERY     grade school    Family History  Problem Relation Age  of Onset   Osteoporosis Mother    Other Father        died in a hurricane    Social History   Socioeconomic History   Marital status: Single    Spouse name: Not on file   Number of children: Not on file   Years of education: 16   Highest education level: Bachelor's degree (e.g., BA, AB, BS)  Occupational History   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[employer]  Tobacco Use   Smoking status: Never   Smokeless tobacco: Never  Vaping Use   Vaping Use: Never used  Substance and Sexual Activity   Alcohol use: Yes    Comment: rarely   Drug use: Never   Sexual activity: Not on file  Other Topics Concern   Not on file  Social History  Narrative   Right handed   Caffeine rare   Live in a two story apartment   Social Determinants of Health   Financial Resource Strain: Not on file  Food Insecurity: Not on file  Transportation Needs: Not on file  Physical Activity: Not on file  Stress: Not on file  Social Connections: Not on file  Intimate Partner Violence: Not on file    Outpatient Medications Prior to Visit  Medication Sig Dispense Refill   acetaminophen (TYLENOL) 325 MG tablet Take 2 tablets (650 mg total) by mouth every 6 (six) hours as needed for mild pain. (Patient not taking: Reported on 02/08/2022)     aspirin EC 81 MG tablet Take 1 tablet (81 mg total) by mouth daily. Swallow whole. (Patient not taking: Reported on 02/08/2022) 30 tablet 11   levothyroxine (SYNTHROID) 88 MCG tablet Take 1 tablet (88 mcg total) by mouth daily. (Patient not taking: Reported on 02/08/2022) 30 tablet 1   metFORMIN (GLUCOPHAGE) 500 MG tablet Take 1 tablet (500 mg total) by mouth 2 (two) times daily with a meal. (Patient not taking: Reported on 02/08/2022) 90 tablet 1   Multiple Vitamin (MULTIVITAMIN) tablet Take 1 tablet by mouth daily. (Patient not taking: Reported on 02/08/2022)     sildenafil (VIAGRA) 100 MG tablet Take 1 tablet (100 mg total) by mouth daily as needed for erectile dysfunction. (Patient not taking: Reported on 02/08/2022) 10 tablet 6   Vitamin D, Ergocalciferol, (DRISDOL) 1.25 MG (50000 UNIT) CAPS capsule Take 1 capsule (50,000 Units total) by mouth every 7 (seven) days. (Patient not taking: Reported on 02/08/2022) 12 capsule 0   No facility-administered medications prior to visit.    No Known Allergies  ROS     Objective:    Physical Exam Constitutional:      General: He is in acute distress.  Eyes:     Conjunctiva/sclera: Conjunctivae normal.  Cardiovascular:     Rate and Rhythm: Normal rate and regular rhythm.  Pulmonary:     Effort: Pulmonary effort is normal.     Breath sounds: Normal breath sounds.   Musculoskeletal:     Cervical back: Normal range of motion and neck supple.  Skin:    General: Skin is warm and dry.  Neurological:     Mental Status: He is alert.     Motor: Weakness present.     Coordination: Coordination abnormal. Finger-Nose-Finger Test abnormal and Heel to Shin Test abnormal.     Gait: Gait abnormal.     BP (!) 142/86 (BP Location: Left Arm, Patient Position: Sitting, Cuff Size: Large)   Pulse 90   Temp 97.6 F (36.4 C) (Temporal)  Ht 6' (1.829 m)   Wt 261 lb (118.4 kg)   SpO2 98%   BMI 35.40 kg/m  Wt Readings from Last 3 Encounters:  02/08/22 261 lb (118.4 kg)  01/31/22 261 lb (118.4 kg)  01/18/22 273 lb (123.8 kg)       Assessment & Plan:   Problem List Items Addressed This Visit   None Visit Diagnoses     Ataxia    -  Primary   Altered mental status, unspecified altered mental status type       Tremor of right hand       Slurred speech       Visual disturbances         In depth work up last week for mild symptoms including negative UDS, myasthenia gravis panel, hepatic function and AFP tumor marker due to cirrhosis hx. Normal ammonia and no vitamin deficiencies.   Patient worse today with ataxia and expressive aphasia. EMS called for further evaluation and transport to ED for possible stroke.   He has been off of metformin and levothyroxine for the past week.      I am having [name] maintain his multivitamin, acetaminophen, aspirin EC, sildenafil, Vitamin D (Ergocalciferol), metFORMIN, and levothyroxine.  No orders of the defined types were placed in this encounter.

## 2022-02-08 NOTE — ED Triage Notes (Signed)
Pt seen at his physician today for ongoing expressive aphasia and ongoing abnormal gait as well. Was seen on 11/29 for similar symptoms here.  Pt has been followed by his physician since then but continues to worsen.  Has seen ophthalmologist as well for vision changes.  No drift noted.  Balance issues started mid November.  Aphasia started 1 week ago.

## 2022-02-08 NOTE — ED Provider Triage Note (Signed)
Emergency Medicine Provider Triage Evaluation Note  Steve Richards , a 56 y.o. male  was evaluated in triage.  Pt complains of progressively worsening neurological symptoms.  He is actually seen last week in our emergency department and had ophthalmology and neurology consults.  He had a overall negative workup including a negative MRI brain.  He has been having expressive aphasia, right tremors in his right arm, balance issues, and right-sided vision changes.  He says that over the past week the symptoms have worsened.  The only change he has noticed though is that he now has tremors down his right leg.  He was seen by his internal medicine provider who wanted him to be reassessed back in the emergency department due to progressive symptoms.  Review of Systems  Positive:  Negative:   Physical Exam  BP (!) 142/106 (BP Location: Right Arm)   Pulse 86   Temp 98.9 F (37.2 C)   Resp 16   SpO2 98%  Gen:   Awake, no distress   Resp:  Normal effort  MSK:   Moves extremities without difficulty  Other:    Medical Decision Making  Medically screening exam initiated at 6:05 PM.  Appropriate orders placed.  Steve Richards was informed that the remainder of the evaluation will be completed by another provider, this initial triage assessment does not replace that evaluation, and the importance of remaining in the ED until their evaluation is complete.  I did order initial stroke labs and CT head, however will defer MRI brain imaging until possible neurological consult given prior negative MRI brain.   Steve Birchwood, PA-C 02/08/22 1807

## 2022-02-09 ENCOUNTER — Inpatient Hospital Stay (HOSPITAL_COMMUNITY): Payer: Commercial Managed Care - HMO

## 2022-02-09 ENCOUNTER — Encounter (HOSPITAL_COMMUNITY): Payer: Self-pay

## 2022-02-09 ENCOUNTER — Emergency Department (HOSPITAL_COMMUNITY): Payer: Commercial Managed Care - HMO

## 2022-02-09 DIAGNOSIS — Y92239 Unspecified place in hospital as the place of occurrence of the external cause: Secondary | ICD-10-CM | POA: Diagnosis not present

## 2022-02-09 DIAGNOSIS — A8100 Creutzfeldt-Jakob disease, unspecified: Secondary | ICD-10-CM | POA: Diagnosis present

## 2022-02-09 DIAGNOSIS — R4701 Aphasia: Secondary | ICD-10-CM | POA: Diagnosis present

## 2022-02-09 DIAGNOSIS — I609 Nontraumatic subarachnoid hemorrhage, unspecified: Secondary | ICD-10-CM | POA: Diagnosis present

## 2022-02-09 DIAGNOSIS — Z953 Presence of xenogenic heart valve: Secondary | ICD-10-CM | POA: Diagnosis not present

## 2022-02-09 DIAGNOSIS — H547 Unspecified visual loss: Secondary | ICD-10-CM | POA: Diagnosis present

## 2022-02-09 DIAGNOSIS — H5347 Heteronymous bilateral field defects: Secondary | ICD-10-CM | POA: Diagnosis not present

## 2022-02-09 DIAGNOSIS — R2990 Unspecified symptoms and signs involving the nervous system: Secondary | ICD-10-CM | POA: Diagnosis not present

## 2022-02-09 DIAGNOSIS — H53461 Homonymous bilateral field defects, right side: Secondary | ICD-10-CM | POA: Diagnosis present

## 2022-02-09 DIAGNOSIS — H532 Diplopia: Secondary | ICD-10-CM | POA: Diagnosis present

## 2022-02-09 DIAGNOSIS — Z515 Encounter for palliative care: Secondary | ICD-10-CM | POA: Diagnosis not present

## 2022-02-09 DIAGNOSIS — R9401 Abnormal electroencephalogram [EEG]: Secondary | ICD-10-CM | POA: Diagnosis present

## 2022-02-09 DIAGNOSIS — G252 Other specified forms of tremor: Secondary | ICD-10-CM | POA: Diagnosis present

## 2022-02-09 DIAGNOSIS — R270 Ataxia, unspecified: Secondary | ICD-10-CM

## 2022-02-09 DIAGNOSIS — Z8262 Family history of osteoporosis: Secondary | ICD-10-CM | POA: Diagnosis not present

## 2022-02-09 DIAGNOSIS — K7460 Unspecified cirrhosis of liver: Secondary | ICD-10-CM | POA: Diagnosis present

## 2022-02-09 DIAGNOSIS — H5340 Unspecified visual field defects: Secondary | ICD-10-CM | POA: Diagnosis present

## 2022-02-09 DIAGNOSIS — E1141 Type 2 diabetes mellitus with diabetic mononeuropathy: Secondary | ICD-10-CM | POA: Diagnosis present

## 2022-02-09 DIAGNOSIS — W19XXXA Unspecified fall, initial encounter: Secondary | ICD-10-CM | POA: Diagnosis not present

## 2022-02-09 DIAGNOSIS — G4733 Obstructive sleep apnea (adult) (pediatric): Secondary | ICD-10-CM | POA: Diagnosis present

## 2022-02-09 DIAGNOSIS — I5042 Chronic combined systolic (congestive) and diastolic (congestive) heart failure: Secondary | ICD-10-CM | POA: Diagnosis present

## 2022-02-09 DIAGNOSIS — D751 Secondary polycythemia: Secondary | ICD-10-CM | POA: Diagnosis present

## 2022-02-09 DIAGNOSIS — E039 Hypothyroidism, unspecified: Secondary | ICD-10-CM | POA: Diagnosis present

## 2022-02-09 DIAGNOSIS — I2510 Atherosclerotic heart disease of native coronary artery without angina pectoris: Secondary | ICD-10-CM | POA: Diagnosis present

## 2022-02-09 DIAGNOSIS — E118 Type 2 diabetes mellitus with unspecified complications: Secondary | ICD-10-CM | POA: Diagnosis not present

## 2022-02-09 DIAGNOSIS — Z66 Do not resuscitate: Secondary | ICD-10-CM | POA: Diagnosis not present

## 2022-02-09 DIAGNOSIS — G253 Myoclonus: Secondary | ICD-10-CM | POA: Diagnosis present

## 2022-02-09 DIAGNOSIS — Z6835 Body mass index (BMI) 35.0-35.9, adult: Secondary | ICD-10-CM | POA: Diagnosis not present

## 2022-02-09 DIAGNOSIS — R0609 Other forms of dyspnea: Secondary | ICD-10-CM | POA: Diagnosis not present

## 2022-02-09 DIAGNOSIS — Z7189 Other specified counseling: Secondary | ICD-10-CM | POA: Diagnosis not present

## 2022-02-09 DIAGNOSIS — A8109 Other Creutzfeldt-Jakob disease: Secondary | ICD-10-CM | POA: Diagnosis not present

## 2022-02-09 DIAGNOSIS — E669 Obesity, unspecified: Secondary | ICD-10-CM | POA: Diagnosis present

## 2022-02-09 LAB — MENINGITIS/ENCEPHALITIS PANEL (CSF)

## 2022-02-09 LAB — CSF CELL COUNT WITH DIFFERENTIAL
RBC Count, CSF: 13 /mm3 — ABNORMAL HIGH
RBC Count, CSF: 390 /mm3 — ABNORMAL HIGH
Tube #: 1
Tube #: 4
WBC, CSF: 1 /mm3 (ref 0–5)
WBC, CSF: 2 /mm3 (ref 0–5)

## 2022-02-09 LAB — PATHOLOGIST SMEAR REVIEW

## 2022-02-09 LAB — CBG MONITORING, ED
Glucose-Capillary: 126 mg/dL — ABNORMAL HIGH (ref 70–99)
Glucose-Capillary: 109 mg/dL — ABNORMAL HIGH (ref 70–99)

## 2022-02-09 LAB — PROTEIN AND GLUCOSE, CSF
Glucose, CSF: 76 mg/dL — ABNORMAL HIGH (ref 40–70)
Total  Protein, CSF: 69 mg/dL — ABNORMAL HIGH (ref 15–45)

## 2022-02-09 MED ORDER — SENNOSIDES-DOCUSATE SODIUM 8.6-50 MG PO TABS: 1.0000 | ORAL_TABLET | Freq: Every evening | ORAL | Status: DC | PRN

## 2022-02-09 MED ORDER — ACETAMINOPHEN 160 MG/5ML PO SOLN: 650.0000 mg | ORAL | Status: DC | PRN

## 2022-02-09 MED ORDER — INSULIN ASPART 100 UNIT/ML IJ SOLN
0.0000 [IU] | Freq: Three times a day (TID) | INTRAMUSCULAR | Status: DC
  Administered 2022-02-09 – 2022-02-10 (×4): 2 [IU] via SUBCUTANEOUS
  Administered 2022-02-11 – 2022-02-12 (×4): 3 [IU] via SUBCUTANEOUS
  Administered 2022-02-12: 8 [IU] via SUBCUTANEOUS
  Administered 2022-02-12 – 2022-02-13 (×3): 3 [IU] via SUBCUTANEOUS
  Administered 2022-02-13 – 2022-02-14 (×2): 5 [IU] via SUBCUTANEOUS
  Administered 2022-02-14 – 2022-02-15 (×3): 3 [IU] via SUBCUTANEOUS
  Administered 2022-02-15 (×2): 5 [IU] via SUBCUTANEOUS
  Administered 2022-02-16 – 2022-02-17 (×3): 3 [IU] via SUBCUTANEOUS
  Administered 2022-02-17 – 2022-02-18 (×2): 5 [IU] via SUBCUTANEOUS
  Administered 2022-02-18 (×2): 3 [IU] via SUBCUTANEOUS
  Administered 2022-02-19 (×2): 2 [IU] via SUBCUTANEOUS
  Administered 2022-02-19: 5 [IU] via SUBCUTANEOUS
  Administered 2022-02-21: 2 [IU] via SUBCUTANEOUS
  Administered 2022-02-21: 3 [IU] via SUBCUTANEOUS
  Administered 2022-02-21: 2 [IU] via SUBCUTANEOUS
  Administered 2022-02-22: 3 [IU] via SUBCUTANEOUS
  Administered 2022-02-22: 2 [IU] via SUBCUTANEOUS

## 2022-02-09 MED ORDER — ACETAMINOPHEN 650 MG RE SUPP: 650.0000 mg | RECTAL | Status: DC | PRN

## 2022-02-09 MED ORDER — ENOXAPARIN SODIUM 40 MG/0.4ML IJ SOSY
40.0000 mg | PREFILLED_SYRINGE | INTRAMUSCULAR | Status: DC
  Administered 2022-02-09: 40 mg via SUBCUTANEOUS
  Filled 2022-02-09: qty 0.4

## 2022-02-09 MED ORDER — ACETAMINOPHEN 325 MG PO TABS: 650.0000 mg | ORAL_TABLET | ORAL | Status: DC | PRN

## 2022-02-09 MED ORDER — SODIUM CHLORIDE 0.9 % IV SOLN: INTRAVENOUS | Status: DC

## 2022-02-09 MED ORDER — LORAZEPAM 1 MG PO TABS
0.5000 mg | ORAL_TABLET | Freq: Once | ORAL | Status: AC | PRN
  Administered 2022-02-09: 0.5 mg via ORAL
  Filled 2022-02-09: qty 1

## 2022-02-09 MED ORDER — STROKE: EARLY STAGES OF RECOVERY BOOK
Freq: Once | Status: AC
  Filled 2022-02-09: qty 1

## 2022-02-09 MED ORDER — GADOBUTROL 1 MMOL/ML IV SOLN
10.0000 mL | Freq: Once | INTRAVENOUS | Status: AC | PRN
  Administered 2022-02-09: 10 mL via INTRAVENOUS

## 2022-02-09 MED ORDER — INSULIN ASPART 100 UNIT/ML IJ SOLN
0.0000 [IU] | Freq: Every day | INTRAMUSCULAR | Status: DC
  Administered 2022-02-10: 2 [IU] via SUBCUTANEOUS
  Administered 2022-02-11: 4 [IU] via SUBCUTANEOUS
  Administered 2022-02-13: 2 [IU] via SUBCUTANEOUS
  Administered 2022-02-14: 4 [IU] via SUBCUTANEOUS
  Administered 2022-02-17 – 2022-02-21 (×3): 2 [IU] via SUBCUTANEOUS

## 2022-02-09 NOTE — Evaluation (Signed)
Speech Language Pathology Evaluation Patient Details Name: Steve Richards MRN: [id number] DOB: [birth year] Today's Date: 02/09/2022 Time: 7062-3762 SLP Time Calculation (min) (ACUTE ONLY): 45 min  Problem List:  Patient Active Problem List   Diagnosis Date Noted   Hemianopsia 02/09/2022   Controlled type 2 diabetes mellitus with complication, without long-term current use of insulin ([place]) 11/21/2021   Refusal of statin medication by patient 11/21/2021   Vitamin D deficiency 11/20/2021   Elevated hemoglobin A1c 11/20/2021   Hypertriglyceridemia 11/20/2021   Hyperlipidemia associated with type 2 diabetes mellitus ([place]) 11/20/2021   Hypothyroidism 12/10/2019   S/P AVR 08/07/2019   S/P aortic valve replacement with bioprosthetic valve 08/06/2019   Aortic valve stenosis 08/06/2019   Severe aortic stenosis    New onset of congestive heart failure ([place]) 07/21/2019   Cellulitis and abscess of left lower extremity 07/21/2019   Hyperglycemia 07/21/2019   Obesity, Class III, BMI 40-49.9 (morbid obesity) ([place]) 07/21/2019   Ascites 07/15/2019   Hepatic cirrhosis ([place]) 07/15/2019   Pleural effusion on right 07/15/2019   Left kidney mass 07/15/2019   Splenomegaly 83/15/1761   Systolic ejection murmur 60/73/7106   Past Medical History:  Past Medical History:  Diagnosis Date   Ascites 07/15/2019   Cellulitis and abscess of left lower extremity 07/21/2019   Chronic combined systolic (congestive) and diastolic (congestive) heart failure (HCC)    Cirrhosis of liver with ascites (HCC)    Hepatic cirrhosis ([place]) 07/15/2019   Hyperglycemia 07/21/2019   Left kidney mass 07/15/2019   Morbid obesity ([place])    Pleural effusion on right 07/15/2019   S/P aortic valve replacement with bioprosthetic valve 08/06/2019   Size 25 mm // Echocardiogram 8/21: EF 55-60, mod LVH, Gr 2 DD, normal RVSF, mod LAE, trivial MR, AVR w mean 13 mmHg and no PVL   Splenomegaly 26/94/8546   Systolic ejection murmur 27/05/5007    Past Surgical History:  Past Surgical History:  Procedure Laterality Date   AORTIC VALVE REPLACEMENT N/A 08/06/2019   Procedure: AORTIC VALVE REPLACEMENT (AVR) using Margaretha Sheffield Resilia 25 MM Aortic Valve.;  Surgeon: Gaye Pollack, MD;  Location: [facility];  Service: Open Heart Surgery;  Laterality: N/A;   CARDIAC CATHETERIZATION     EXPLORATION POST OPERATIVE OPEN HEART N/A 08/06/2019   Procedure: EXPLORATION POST OPERATIVE OPEN HEART;  Surgeon: Gaye Pollack, MD;  Location: [facility];  Service: Open Heart Surgery;  Laterality: N/A;   IR THORACENTESIS ASP PLEURAL SPACE W/IMG GUIDE  06/25/2019   RIGHT/LEFT HEART CATH AND CORONARY ANGIOGRAPHY N/A 07/28/2019   Procedure: RIGHT/LEFT HEART CATH AND CORONARY ANGIOGRAPHY;  Surgeon: Sherren Mocha, MD;  Location: [facility];  Service: Cardiovascular;  Laterality: N/A;   TEE WITHOUT CARDIOVERSION N/A 08/06/2019   Procedure: TRANSESOPHAGEAL ECHOCARDIOGRAM (TEE);  Surgeon: Gaye Pollack, MD;  Location: [facility];  Service: Open Heart Surgery;  Laterality: N/A;   WISDOM TOOTH EXTRACTION     college age   WRIST FRACTURE SURGERY     grade school   HPI:  56yo male admitted 02/08/22 with stroke symptoms including persistent and worsening neurologic issues including right hemianopsia, ataxia, aphasia and discoordination. Sx started several weeks ago.  PMH: acites, cellulitis LLE, Hepatic Cirrhosis, hyperglycemia, left kidney mass, morbid obesity, s/p AVR, splenomegaly, systolic ejection murmur.   MRI =  Small 14 mm focus suspicious for subarachnoid hemorrhage at the right vertex adjacent to the falx. Not on MRI last month. Also a small number of nonspecific chronic microhemorrhages in both  hemispheres.   Assessment / Plan / Recommendation Clinical Impression  Pt seen at bedside to assess speech, language, and cognition. Pt was awake and alert, pleasant and cooperative with questions. Pt's speech is fully intelligible. Receptive Language is grossly WFL.  Repetition of complex commands was needed intermittently. Pt demonstrated the ability to verbally answer complex/abstract yes/no questions, and follow 3-step commands. Reading comprehension was not assessed at this time, due to visual impairment. 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 Pt has minimal difficulty with automatic sequences, repetition of sentence length material, and naming objects around the room. Open ended conversations and questions result in hesitations or neologistic paraphasic errors. Pt is very aware of his errors. ST will continue working with pt acutely to maximize word retrieval. Continued ST intervention may be beneficial after acute hospitalization, given pt's level of independence prior to admit (working full time, Independent with bills and medications, driving). During this assessment, pt was noted to have spastic movement of his right hand/arm when trying to touch his nose.    SLP Assessment  SLP Recommendation/Assessment: Patient needs continued Speech Language Pathology Services  SLP Visit Diagnosis: Aphasia (R47.01)    Recommendations for follow up therapy are one component of a multi-disciplinary discharge planning process, led by the attending physician.  Recommendations may be updated based on patient status, additional functional criteria and insurance authorization.    Follow Up Recommendations  Follow physician's recommendations for discharge plan and follow up therapies    Assistance Recommended at Discharge  Intermittent Supervision/Assistance  Functional Status Assessment Patient has had a recent decline in their functional status and demonstrates the ability to make significant improvements in function in a reasonable and predictable amount of time.  Frequency and Duration min 1 x/week  2 weeks      SLP Evaluation Cognition  Overall Cognitive Status:  (appears WFL, however, pt has mild  aphasia.) Arousal/Alertness: Awake/alert       Comprehension  Auditory Comprehension Overall Auditory Comprehension: Appears within functional limits for tasks assessed Yes/No Questions: Within Functional Limits Commands: Within Functional Limits Other Conversation Comments: open ended and complex word finding is impaired Interfering Components: Visual impairments Visual Recognition/Discrimination Discrimination: Not tested Reading Comprehension Reading Status: Not tested Interfering Components: Visual perceptual    Expression Expression Primary Mode of Expression: Verbal Verbal Expression Overall Verbal Expression: Impaired Initiation: No impairment Automatic Speech: Name;Counting;Day of week;Month of year Level of Generative/Spontaneous Verbalization: Phrase Repetition: No impairment Naming: Impairment Confrontation: Impaired Verbal Errors: Neologisms;Aware of errors Pragmatics: No impairment Written Expression Dominant Hand: Right Written Expression: Not tested   Oral / Motor  Oral Motor/Sensory Function Overall Oral Motor/Sensory Function: Within functional limits Motor Speech Overall Motor Speech: Appears within functional limits for tasks assessed Respiration: Within functional limits Resonance: Within functional limits Articulation: Within functional limitis Intelligibility: Intelligible Motor Planning: Witnin functional limits Motor Speech Errors: Not applicable           Chane Cowden B. Quentin Ore, [facility], [place] Speech Language Pathologist Office: [phone]  Shonna Chock 02/09/2022, 2:11 PM

## 2022-02-09 NOTE — ED Provider Notes (Addendum)
[facility] EMERGENCY DEPARTMENT Provider Note  CSN: [id number] Arrival date & time: 02/08/22 1731  Chief Complaint(s) Stroke Symptoms  HPI Steve Richards is a 56 y.o. male with a past medical history listed below who presents to the emergency department for persistent and worsening neurologic issues including right hemianopsia, ataxia, aphasia and discoordination.  Symptoms began several weeks ago.  He was evaluated 2 weeks ago for this and had negative MRI with and without contrast ruling out stroke or demyelinating disease.  He is following up with his primary care provider who obtained extensive panel that has been negative for myasthenia gravis, vitamin deficiency, syphilis.  Given his progressive symptoms, they recommended he present to the emergency department for additional evaluation and to rule out stroke.  The history is provided by medical records.    Past Medical History Past Medical History:  Diagnosis Date   Ascites 07/15/2019   Cellulitis and abscess of left lower extremity 07/21/2019   Chronic combined systolic (congestive) and diastolic (congestive) heart failure (HCC)    Cirrhosis of liver with ascites (HCC)    Hepatic cirrhosis (HCC) 07/15/2019   Hyperglycemia 07/21/2019   Left kidney mass 07/15/2019   Morbid obesity ([place])    Pleural effusion on right 07/15/2019   S/P aortic valve replacement with bioprosthetic valve 08/06/2019   Size 25 mm // Echocardiogram 8/21: EF 55-60, mod LVH, Gr 2 DD, normal RVSF, mod LAE, trivial MR, AVR w mean 13 mmHg and no PVL   Splenomegaly 93/81/0175   Systolic ejection murmur 12/27/8525   Patient Active Problem List   Diagnosis Date Noted   Hemianopsia 02/09/2022   Controlled type 2 diabetes mellitus with complication, without long-term current use of insulin ([place]) 11/21/2021   Refusal of statin medication by patient 11/21/2021   Vitamin D deficiency 11/20/2021   Elevated hemoglobin A1c 11/20/2021    Hypertriglyceridemia 11/20/2021   Hyperlipidemia associated with type 2 diabetes mellitus ([place]) 11/20/2021   Hypothyroidism 12/10/2019   S/P AVR 08/07/2019   S/P aortic valve replacement with bioprosthetic valve 08/06/2019   Aortic valve stenosis 08/06/2019   Severe aortic stenosis    New onset of congestive heart failure ([place]) 07/21/2019   Cellulitis and abscess of left lower extremity 07/21/2019   Hyperglycemia 07/21/2019   Obesity, Class III, BMI 40-49.9 (morbid obesity) ([place]) 07/21/2019   Ascites 07/15/2019   Hepatic cirrhosis ([place]) 07/15/2019   Pleural effusion on right 07/15/2019   Left kidney mass 07/15/2019   Splenomegaly 78/24/2353   Systolic ejection murmur 61/44/3154   Home Medication(s) Prior to Admission medications   Medication Sig Start Date End Date Taking? Authorizing Provider  acetaminophen (TYLENOL) 325 MG tablet Take 2 tablets (650 mg total) by mouth every 6 (six) hours as needed for mild pain. 08/17/19  Yes Elgie Collard, PA-C  aspirin EC 81 MG tablet Take 1 tablet (81 mg total) by mouth daily. Swallow whole. 01/12/20  Yes Jerline Pain, MD  Multiple Vitamin (MULTIVITAMIN) tablet Take 1 tablet by mouth daily.   Yes [provider]  levothyroxine (SYNTHROID) 88 MCG tablet Take 1 tablet (88 mcg total) by mouth daily. Patient not taking: Reported on 02/08/2022 01/18/22   Harland Dingwall L, NP-C  metFORMIN (GLUCOPHAGE) 500 MG tablet Take 1 tablet (500 mg total) by mouth 2 (two) times daily with a meal. Patient not taking: Reported on 02/08/2022 11/21/21   Girtha Rm, NP-C  Vitamin D, Ergocalciferol, (DRISDOL) 1.25 MG (50000 UNIT) CAPS capsule Take 1 capsule (50,000  Units total) by mouth every 7 (seven) days. Patient not taking: Reported on 02/09/2022 11/21/21   Girtha Rm, NP-C                                                                                                                                    Allergies Patient has no known  allergies.  Review of Systems Review of Systems As noted in HPI  Physical Exam Vital Signs  I have reviewed the triage vital signs BP (!) 135/100   Pulse 78   Temp 98.2 F (36.8 C) (Oral)   Resp (!) 6   Ht 6' (1.829 m)   Wt 118.4 kg   SpO2 96%   BMI 35.40 kg/m   Physical Exam Vitals reviewed.  Constitutional:      General: He is not in acute distress.    Appearance: He is well-developed. He is not diaphoretic.  HENT:     Head: Normocephalic and atraumatic.     Nose: Nose normal.  Eyes:     General: No scleral icterus.       Right eye: No discharge.        Left eye: No discharge.     Conjunctiva/sclera: Conjunctivae normal.     Pupils: Pupils are equal, round, and reactive to light.  Cardiovascular:     Rate and Rhythm: Normal rate and regular rhythm.     Heart sounds: No murmur heard.    No friction rub. No gallop.  Pulmonary:     Effort: Pulmonary effort is normal. No respiratory distress.     Breath sounds: Normal breath sounds. No stridor. No rales.  Abdominal:     General: There is no distension.     Palpations: Abdomen is soft.     Tenderness: There is no abdominal tenderness.  Musculoskeletal:        General: No tenderness.     Cervical back: Normal range of motion and neck supple.  Skin:    General: Skin is warm and dry.     Findings: No erythema or rash.  Neurological:     Mental Status: He is alert and oriented to person, place, and time.     Comments: Mental Status:  Alert and oriented to person, place, and time.  Attention and concentration normal.  Speech clear.  Recent memory is intact  Cranial Nerves:  II Visual Fields: Intact to confrontation. Right field cut III, IV, VI: Pupils equal and reactive to light and near. Full eye movement without nystagmus  V Facial Sensation: Normal. No weakness of masticatory muscles  VII: No facial weakness or asymmetry  VIII Auditory Acuity: Grossly normal  IX/X: The uvula is midline; the palate elevates  symmetrically  XI: Normal sternocleidomastoid and trapezius strength  XII: The tongue is midline. No atrophy or fasciculations.   Motor System: Muscle Strength: 5/5 and symmetric in the upper and lower extremities. No pronation or drift.  Muscle Tone: Tone and muscle bulk are normal in the upper and lower extremities.  Reflexes:  No Clonus Coordination: Intact finger-to-nose, heel-to-shin. Essential tremor.  Sensation: Intact to light touch. Positive Romberg test.  Gait: ataxic      ED Results and Treatments Labs (all labs ordered are listed, but only abnormal results are displayed) Labs Reviewed  CBC - Abnormal; Notable for the following components:      Result Value   RBC 5.89 (*)    Hemoglobin 17.8 (*)    HCT 52.7 (*)    All other components within normal limits  COMPREHENSIVE METABOLIC PANEL - Abnormal; Notable for the following components:   Glucose, Bld 118 (*)    AST 42 (*)    All other components within normal limits  I-STAT CHEM 8, ED - Abnormal; Notable for the following components:   Glucose, Bld 118 (*)    Calcium, Ion 1.10 (*)    Hemoglobin 18.7 (*)    HCT 55.0 (*)    All other components within normal limits  CSF CULTURE W GRAM STAIN  ETHANOL  PROTIME-INR  APTT  DIFFERENTIAL  RAPID URINE DRUG SCREEN, HOSP PERFORMED  URINALYSIS, ROUTINE W REFLEX MICROSCOPIC  CSF CELL COUNT WITH DIFFERENTIAL  CSF CELL COUNT WITH DIFFERENTIAL  MISC [facility] TEST (SEND OUT)  BETA ISOFORM (CREUTZFELDT-JAKOB DISEASE)  PROTEIN AND GLUCOSE, CSF  VDRL, CSF  OLIGOCLONAL BANDS, CSF + SERM  DRAW EXTRA CLOT TUBE  IGG CSF INDEX  DRAW EXTRA CLOT TUBE  MENINGITIS/ENCEPHALITIS PANEL (CSF)  LYME DISEASE DNA BY PCR(BORRELIA BURG)  MISC [facility] TEST (SEND OUT)  MISC [facility] TEST (SEND OUT)  ANTINUCLEAR ANTIBODIES, IFA  ANTI-DNA ANTIBODY, DOUBLE-STRANDED  THYROGLOBULIN ANTIBODY  ANGIOTENSIN CONVERTING ENZYME, CSF  ANGIOTENSIN CONVERTING ENZYME                                                                                                                          EKG  EKG Interpretation  Date/Time:  Thursday February 08 2022 17:48:34 EST Ventricular Rate:  89 PR Interval:  172 QRS Duration: 100 QT Interval:  366 QTC Calculation: 445 R Axis:   10 Text Interpretation: Normal sinus rhythm Normal ECG When compared with ECG of 06-Aug-2019 14:05, PREVIOUS ECG IS PRESENT Confirmed by Addison Lank [phone]) on 02/09/2022 2:18:05 AM       Radiology MR BRAIN W WO CONTRAST  Result Date: 02/09/2022 CLINICAL DATA:  56 year old male with progressive neurologic deficits since last month. Aphasia, abnormal gait, right arm tremor, now with right lower extremity tremor and progressive. And progressive right greater than left hemianopsia. Neurology considering autoimmune versus paraneoplastic etiology versus CJD. History of cirrhosis. EXAM: MRI HEAD WITHOUT AND WITH CONTRAST TECHNIQUE: Multiplanar, multiecho pulse sequences of the brain and surrounding structures were obtained without and with intravenous contrast. CONTRAST:  16m GADAVIST GADOBUTROL 1 MMOL/ML IV SOLN COMPARISON:  Head CT yesterday. Brain MRI without and with contrast 01/30/2022. FINDINGS: Brain: Cerebral volume appears stable and within normal  limits for age. But along the right superior frontal gyrus there is a seemingly small new nodular roughly 14 mm extra-axial lesion with FLAIR hyperintensity (series 11, image 24), partial intrinsic T1 hyperintensity (series 16, image 49) and questionable enhancement (series 19, images 19 and 20) which is subtle on T2 and diffusion but was not clearly visible on MRI last month. This has a nonspecific intermediate density appearance on MRI this morning, but demonstrates pronounced susceptibility on SWI (series 14, image 49) and is therefore suspicious for trace subarachnoid hemorrhage. Adjacent superior sagittal sinus seems unremarkable. Punctate chronic microhemorrhage in the left frontal  lobe white matter and right parietal lobe on series 14, image 35 are stable from last month but more conspicuous than on the T2* imaging at that time. And there appear to be 2 additional punctate chronic microhemorrhages in the frontal lobe white matter also on image 35. But no other chronic cerebral blood products. On this 1.5 Tesla exam today there is suspicious asymmetric cortical increased diffusion affecting multiple sites in the left hemisphere, 1 of the most conspicuous areas is the left parietal lobe (series 5, image 87). This might have been present last month, but as on that exam there is no convincing T2 or FLAIR cortex correlation, no convincing gyral edema. Similar subtle asymmetry present on the coronal DWI. No abnormal enhancement identified. No dural thickening. Background gray and white matter signal remains normal otherwise. No restricted diffusion suggestive of acute infarction. No midline shift, mass effect. No ventriculomegaly. No ventricular debris. Cervicomedullary junction and pituitary are within normal limits. No intrinsic T1 hyperintensity in the deep gray nuclei. Vascular: Major intracranial vascular flow voids are stable, preserved. The major dural venous sinuses are enhancing and appear to be patent. Skull and upper cervical spine: Negative. Visualized bone marrow signal is within normal limits. Sinuses/Orbits: Rightward gaze. Normal suprasellar cistern and unremarkable optic chiasm. Chronic appearing left maxillary wall deformity. Left maxillary and ethmoid mucosal thickening. Other: Mastoids remain clear. Visible internal auditory structures appear normal. IMPRESSION: 1. Small 14 mm focus suspicious for subarachnoid hemorrhage at the right vertex adjacent to the falx. This was subtle on the earlier CT, not apparent on MRI last month. Etiology and significance is unclear, has there been recent fall? No other acute intracranial hemorrhage is identified, but there is a small number of  nonspecific chronic microhemorrhages in both hemispheres. 2. Otherwise a largely unremarkable MRI appearance of the brain, HOWEVER, suspicion of subtle abnormal cortical diffusion widely scattered in the left hemisphere. Although with no cortical edema or abnormal signal on the remaining sequences. No enhancement. Given the clinical suspicion Creutzfeldt-Jakob Disease cannot be excluded. Differential diagnosis would include autoimmune or paraneoplastic encephalitis - although absence of T2/FLAIR abnormality argues against those. Study discussed by telephone with Dr. Donnetta Simpers on 02/09/2022 at 06:48. Electronically Signed   By: Genevie Ann M.D.   On: 02/09/2022 06:49   CT HEAD WO CONTRAST  Result Date: 02/08/2022 CLINICAL DATA:  Neuro deficit, acute, stroke suspected. Expressive aphasia, abnormal gait. EXAM: CT HEAD WITHOUT CONTRAST TECHNIQUE: Contiguous axial images were obtained from the base of the skull through the vertex without intravenous contrast. RADIATION DOSE REDUCTION: This exam was performed according to the departmental dose-optimization program which includes automated exposure control, adjustment of the mA and/or kV according to patient size and/or use of iterative reconstruction technique. COMPARISON:  MRI 01/30/2022 FINDINGS: Brain: Normal anatomic configuration. No abnormal intra or extra-axial mass lesion or fluid collection. No abnormal mass effect or midline  shift. No evidence of acute intracranial hemorrhage or infarct. Ventricular size is normal. Cerebellum unremarkable. Vascular: Unremarkable Skull: Intact Sinuses/Orbits: Air-fluid level within the left maxillary sinus has resolved though mild mucosal thickening persists. Dense opacification of the left frontal sinus and several left ethmoid air cells persists, however. Remaining paranasal sinuses are clear. Orbits are unremarkable. Other: Mastoid air cells and middle ear cavities are clear. IMPRESSION: 1. No acute intracranial  abnormality. No calvarial fracture. 2. Improved left maxillary sinus disease. Persistent left frontal and ethmoid paranasal sinus disease. Electronically Signed   By: Fidela Salisbury M.D.   On: 02/08/2022 19:30    Medications Ordered in ED Medications  LORazepam (ATIVAN) tablet 0.5 mg (0.5 mg Oral Given 02/09/22 0552)  gadobutrol (GADAVIST) 1 MMOL/ML injection 10 mL (10 mLs Intravenous Contrast Given 02/09/22 0606)                                                                                                                                     Procedures .Lumbar Puncture  Date/Time: 02/09/2022 7:02 AM  Performed by: Fatima Blank, MD Authorized by: Fatima Blank, MD   Consent:    Consent obtained:  Written   Consent given by:  Patient   Risks discussed:  Bleeding, headache, infection, nerve damage, repeat procedure and pain   Alternatives discussed:  Delayed treatment Universal protocol:    Imaging studies available: yes     Patient identity confirmed:  Verbally with patient and arm band Pre-procedure details:    Procedure purpose:  Diagnostic   Preparation: Patient was prepped and draped in usual sterile fashion   Anesthesia:    Anesthesia method:  Local infiltration   Local anesthetic:  Lidocaine 1% w/o epi Procedure details:    Lumbar space:  L3-L4 interspace   Patient position:  L lateral decubitus   Needle gauge:  18   Needle type:  Spinal needle - Quincke tip   Needle length (in):  5.0   Number of attempts:  1   Fluid appearance:  Blood-tinged then clearing   Tubes of fluid:  4   Total volume (ml):  12 Post-procedure details:    Puncture site:  Adhesive bandage applied and direct pressure applied   Procedure completion:  Tolerated well, no immediate complications .Critical Care  Performed by: Fatima Blank, MD Authorized by: Fatima Blank, MD   Critical care provider statement:    Critical care time (minutes):  45   Critical care  time was exclusive of:  Separately billable procedures and treating other patients   Critical care was necessary to treat or prevent imminent or life-threatening deterioration of the following conditions:  CNS failure or compromise   Critical care was time spent personally by me on the following activities:  Development of treatment plan with patient or surrogate, discussions with consultants, evaluation of patient's response to treatment, examination of patient, obtaining history from patient or  surrogate, review of old charts, re-evaluation of patient's condition, pulse oximetry, ordering and review of radiographic studies, ordering and review of laboratory studies and ordering and performing treatments and interventions   (including critical care time)  Medical Decision Making / ED Course   Medical Decision Making Amount and/or Complexity of Data Reviewed External Data Reviewed: radiology and notes.    Details: MRI noted above PCP notes mentioned above Labs: ordered. Decision-making details documented in ED Course. Radiology: ordered and independent interpretation performed. Decision-making details documented in ED Course. ECG/medicine tests: ordered and independent interpretation performed. Decision-making details documented in ED Course.  Risk Decision regarding hospitalization.   Constellation of symptoms are not consistent with acute stroke.  Patient was recently rule out for demyelinating disease and various other neuromuscular deficits.  Repeat labs were obtained as well as a CT scan to rule out any acute changes which did not show any acute electrolyte derangements or metabolic derangements.  CT head negative for ICH or mass effect.  Consulted neurology given his worsening progression.  After their evaluation, they recommended admission for repeat MRI with and without contrast as well as lumbar puncture and CSF labs to rule out autoimmune disorder, paraneoplastic disorder,  CJD.  MRI relatively stable but with evidence of small SAH.  LP done and CSF sent  Admitted to medicine.  Neurology to follow.       Final Clinical Impression(s) / ED Diagnoses Final diagnoses:  Hemianopsia  Ataxia           This chart was dictated using voice recognition software.  Despite best efforts to proofread,  errors can occur which can change the documentation meaning.      Fatima Blank, MD 02/09/22 [phone]

## 2022-02-09 NOTE — ED Notes (Signed)
Neurology at bedside speaking with patient

## 2022-02-09 NOTE — ED Notes (Signed)
Benjamine Mola (friend) would like a call back for status update on pt. Phone is [phone]

## 2022-02-09 NOTE — Consult Note (Addendum)
NEUROLOGY CONSULTATION NOTE   Date of service: February 09, 2022 Patient Name: Steve Richards MRN:  [id number] DOB:  [birth year] Reason for consult: "Aphasia, R hemianopsia and intention tremor with executive dysfunction, worsening over the last couple of weeks" Requesting Provider: Fatima Blank, * _ _ _   _ __   _ __ _ _  __ __   _ __   __ _  History of Present Illness  Von Quintanar is a 56 y.o. male with PMH significant for hepatic cirrhosis, morbid obesity, aortic valve replacement with bioprosthetic valve, who presents with word finding difficulty, tremor and vision abnormalities.  Reports that back in summer of this year, his left hand was cramping up and weak with difficulty flexing his fingers and making a hand grip. Saw neurology and told he had a pinched nerve in his spine. Was supposed to get EMG but did not seem to follow up. Still has weakness in his L hand that per patient is chronic and not improved or worsened.  However, reports that he started noticing tremoring in his R hand and incoordination on the right around or shortly before Nov 16th. He reports that this has progressively worsened and over the last 7-10 days his R leg seems to be very incoordinated. He reports that his vision on the right starting going blurry around the same time as the tremors in his hand. He saw optometrist and ophthalmologist and diagnosed with R hemianopsia and has no vision on the right side whether he looks from his left eye or from his R eye. He has also noticed that he has difficulty finding words. He get stuck in conversation and this too started around the same time and is getting worse by the day.  No prior similar symptoms, no fever, no recent signs or symptoms of a URI, UTI or gastroenteritis. Np prior similar symptoms, no family hx of neurological illness. No recent hikes, no recent travel, no exposure to TB that he can recall.  He came to the ED on Nov 28th and had workup for this with MRI  Brain which was read as normal but does seem to be concerning for subtle cortical ribboning on DWI images in BL hemispheres with left worse than right and this seems to go more anteriorly in the left hemisphere. This however, was not called out again because this is so subtle.  He returns to the ED today for persistent worsening of these symptoms.  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  ROS   Constitutional Denies weight loss, fever and chills.   HEENT + changes in vision with intact hearing.   Respiratory Denies SOB and cough.   CV Denies palpitations and CP   GI Denies abdominal pain, nausea, vomiting and diarrhea.   GU Denies dysuria and urinary frequency.   MSK Denies myalgia and joint pain.   Skin Denies rash and pruritus.   Neurological Denies headache and syncope.   Psychiatric Denies recent changes in mood. Denies anxiety and depression.    Past History   Past Medical History:  Diagnosis Date   Ascites 07/15/2019   Cellulitis and abscess of left lower extremity 07/21/2019   Chronic combined systolic (congestive) and diastolic (congestive) heart failure (HCC)    Cirrhosis of liver with ascites (HCC)    Critical aortic valve stenosis    Hepatic cirrhosis ([place]) 07/15/2019   Hyperglycemia 07/21/2019   Left kidney mass 07/15/2019  Morbid obesity (HCC)    Pleural effusion    Pleural effusion on right 07/15/2019   S/P aortic valve replacement with bioprosthetic valve 08/06/2019   Size 25 mm // Echocardiogram 8/21: EF 55-60, mod LVH, Gr 2 DD, normal RVSF, mod LAE, trivial MR, AVR w mean 13 mmHg and no PVL   Severe aortic stenosis    Splenomegaly 5/63/8937   Systolic ejection murmur 3/42/8768   Past Surgical History:  Procedure Laterality Date   AORTIC VALVE REPLACEMENT N/A 08/06/2019   Procedure: AORTIC VALVE REPLACEMENT (AVR) using Margaretha Sheffield Resilia 25 MM Aortic Valve.;  Surgeon: Gaye Pollack, MD;  Location: [facility];   Service: Open Heart Surgery;  Laterality: N/A;   CARDIAC CATHETERIZATION     EXPLORATION POST OPERATIVE OPEN HEART N/A 08/06/2019   Procedure: EXPLORATION POST OPERATIVE OPEN HEART;  Surgeon: Gaye Pollack, MD;  Location: [facility];  Service: Open Heart Surgery;  Laterality: N/A;   IR THORACENTESIS ASP PLEURAL SPACE W/IMG GUIDE  06/25/2019   RIGHT/LEFT HEART CATH AND CORONARY ANGIOGRAPHY N/A 07/28/2019   Procedure: RIGHT/LEFT HEART CATH AND CORONARY ANGIOGRAPHY;  Surgeon: Sherren Mocha, MD;  Location: [facility];  Service: Cardiovascular;  Laterality: N/A;   TEE WITHOUT CARDIOVERSION N/A 08/06/2019   Procedure: TRANSESOPHAGEAL ECHOCARDIOGRAM (TEE);  Surgeon: Gaye Pollack, MD;  Location: [facility];  Service: Open Heart Surgery;  Laterality: N/A;   WISDOM TOOTH EXTRACTION     college age   81 FRACTURE SURGERY     grade school   Family History  Problem Relation Age of Onset   Osteoporosis Mother    Other Father        died in a hurricane   Social History   Socioeconomic History   Marital status: Single    Spouse name: Not on file   Number of children: Not on file   Years of education: 16   Highest education level: Bachelor's degree (e.g., BA, AB, BS)  Occupational History   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[employer]  Tobacco Use   Smoking status: Never   Smokeless tobacco: Never  Vaping Use   Vaping Use: Never used  Substance and Sexual Activity   Alcohol use: Yes    Comment: rarely   Drug use: Never   Sexual activity: Not on file  Other Topics Concern   Not on file  Social History Narrative   Right handed   Caffeine rare   Live in a two story apartment   Social Determinants of Health   Financial Resource Strain: Not on file  Food Insecurity: Not on file  Transportation Needs: Not on file  Physical Activity: Not on file  Stress: Not on file  Social Connections: Not on file   No Known Allergies  Medications  (Not in a hospital admission)    Vitals    Vitals:   02/09/22 0058 02/09/22 0210 02/09/22 0400 02/09/22 0407  BP:  (!) 136/92 (!) 145/96 (!) 145/96  Pulse:  74 88 88  Resp:    17  Temp:    98.2 F (36.8 C)  TempSrc:    Oral  SpO2:  98% 93% 93%  Weight: 118.4 kg     Height: 6' (1.829 m)        Body mass index is 35.4 kg/m.  Physical Exam   General: Laying comfortably in bed; in no acute distress.  HENT: Normal oropharynx and mucosa. Normal external appearance of ears and nose.  Neck: Supple, no  pain or tenderness  CV: No JVD. No peripheral edema.  Pulmonary: Symmetric Chest rise. Normal respiratory effort.  Abdomen: Soft to touch, non-tender.  Ext: No cyanosis, edema, or deformity  Skin: No rash. Normal palpation of skin.   Musculoskeletal: Normal digits and nails by inspection. No clubbing.   Neurologic Examination  Mental status/Cognition: Alert, oriented to self, place, month and year, good attention. Speech/language: Non fluent, get stuck on words during conversation, comprehension intact, able to name most objects but not all, repetition intact.  Cranial nerves:   CN II Pupils equal and reactive to light, R hemianopsia.   CN III,IV,VI EOM intact, no gaze preference or deviation, no nystagmus   CN V normal sensation in V1, V2, and V3 segments bilaterally   CN VII no asymmetry, no nasolabial fold flattening   CN VIII normal hearing to speech   CN IX & X normal palatal elevation, no uvular deviation   CN XI 5/5 head turn and 5/5 shoulder shrug bilaterally   CN XII midline tongue protrusion   Motor:  Muscle bulk: normal, tone normal, pronator drift none tremulous movements with incoordination noted on the right but also mild concern on the left. Mvmt Root Nerve  Muscle Right Left Comments  SA C5/6 Ax Deltoid 5 5   EF C5/6 Mc Biceps 5 5   EE C6/7/8 Rad Triceps 5 5   WF C6/7 Med FCR     WE C7/8 PIN ECU     F Ab C8/T1 U ADM/FDI 5 5   HF L1/2/3 Fem Illopsoas 5 5   KE L2/3/4 Fem Quad 5 5   DF L4/5 D Peron  Tib Ant 5 5   PF S1/2 Tibial Grc/Sol 5 5    Reflexes:  Right Left Comments  Pectoralis      Biceps (C5/6) 2 2   Brachioradialis (C5/6) 2 2    Triceps (C6/7) 2 2    Patellar (L3/4) 2 2    Achilles (S1) 2 2    Hoffman      Plantar up up   Jaw jerk    Sensation:  Light touch Intact BL   Pin prick    Temperature    Vibration   Proprioception    Coordination/Complex Motor:  - Finger to Nose with intention tremor and ataxia BL, right much more worse but mild on the left. - Heel to shin with ataxia on the right and mildly on the left. - Rapid alternating movement unable to do with either of his hands. - Gait: Deferred for patient safety.  Labs   CBC:  Recent Labs  Lab 02/08/22 1818 02/08/22 1837  WBC 8.2  --   NEUTROABS 5.8  --   HGB 17.8* 18.7*  HCT 52.7* 55.0*  MCV 89.5  --   PLT 202  --     Basic Metabolic Panel:  Lab Results  Component Value Date   NA 138 02/08/2022   K 3.9 02/08/2022   CO2 23 02/08/2022   GLUCOSE 118 (H) 02/08/2022   BUN 11 02/08/2022   CREATININE 0.80 02/08/2022   CALCIUM 9.2 02/08/2022   GFRNONAA >60 02/08/2022   GFRAA 115 04/18/2020   Lipid Panel:  Lab Results  Component Value Date   LDLCALC 113 (H) 04/18/2020   HgbA1c:  Lab Results  Component Value Date   HGBA1C 7.1 (H) 11/16/2021   Urine Drug Screen:     Component Value Date/Time   LABOPIA NONE DETECTED 07/21/2019 1555   COCAINSCRNUR Negative  01/31/2022 1414   LABBENZ NONE DETECTED 07/21/2019 1555   AMPHETMU NONE DETECTED 07/21/2019 [place] 07/21/2019 [place] 07/21/2019 1555    Alcohol Level     Component Value Date/Time   ETH <10 02/08/2022 1818    CT Head without contrast(Personally reviewed): CTH was negative for a large hypodensity concerning for a large territory infarct or hyperdensity concerning for an [place]  MRI Brain 01/30/22(Personally reviewed): Read by Neurorads as follows: 1. Normal appearance of the brain  itself. No abnormality seen to explain the presenting symptoms. Orbits appear normal as seen. 2. Left ostiomeatal unit pattern of sinusitis.  However, I do think that he has cortical ribboning in BL hemispheres with left significantly worse than right.  rEEG:  pending  Impression   Duayne Brideau is a 56 y.o. male with PMH significant for hepatic cirrhosis, morbid obesity, aortic valve replacement with bioprosthetic valve, L hand weakness presumably from ?pinched nerve in summer 2023 who presents with expressive aphasia, ataxia and tremor with right worse than left and R hemianopsia that has progressively worsened since 01/18/22.  He had MRI brain on 01/30/22, read as normal but does seem to be suggestive of cortical ribboning on DWI which seems to be worse on the left more so than right.  Etiology of his presentation is unclear. Differential includes autoimmune, paraneoplastic etiology vs could be CJD or infectious.  Recommendations  - Repeat MRI Brain with and without contrast to evaluate for progression specially since symptoms have significantly worsened since 01/30/22. - LP with CSF cell count, differential, protein, glucose, CSF gram stain and cultures, meningitis/encephalitis panel, CSF autoimmune encephalopathy panel, Oligoclonal bands and IgG index, Lyme PCR, VDRL CSF, protein 14-3-3 and T-Tau with reflex to RT-Quic, CSF ACE. - Serum autoimmune encephalitis panel, Thyroglobulin Ab, ANA IFA, DSDNA, ACE - routine EEG. - If the preliminary CSF studies are not particularly concerning for infectious process, recommend IV solumedrol 1060m Daily x 5 doses along with PPI while on steroids.  ______________________________________________________________________  - I notified pathologist on call Dr. NJaquita Foldsat [phone] 02/09/22 when ordering CJD testing. Also notified lab staff at [phone]  Thank you for the opportunity to take part in the care of this patient. If you have any further  questions, please contact the neurology consultation attending.  Signed,  SOak RunPager Number [phone]_ _ _   _ __   _ __ _ _  __ __   _ __   __ _

## 2022-02-09 NOTE — Plan of Care (Signed)
Problem: Education: Goal: Ability to describe self-care measures that may prevent or decrease complications (Diabetes Survival Skills Education) will improve 02/09/2022 2149 by Ancil Linsey, RN Outcome: Progressing 02/09/2022 2149 by Ancil Linsey, RN Outcome: Progressing Goal: Individualized Educational Video(s) 02/09/2022 2149 by Ancil Linsey, RN Outcome: Progressing 02/09/2022 2149 by Ancil Linsey, RN Outcome: Progressing   Problem: Coping: Goal: Ability to adjust to condition or change in health will improve 02/09/2022 2149 by Ancil Linsey, RN Outcome: Progressing 02/09/2022 2149 by Ancil Linsey, RN Outcome: Progressing   Problem: Fluid Volume: Goal: Ability to maintain a balanced intake and output will improve 02/09/2022 2149 by Ancil Linsey, RN Outcome: Progressing 02/09/2022 2149 by Ancil Linsey, RN Outcome: Progressing   Problem: Health Behavior/Discharge Planning: Goal: Ability to identify and utilize available resources and services will improve 02/09/2022 2149 by Ancil Linsey, RN Outcome: Progressing 02/09/2022 2149 by Ancil Linsey, RN Outcome: Progressing Goal: Ability to manage health-related needs will improve 02/09/2022 2149 by Ancil Linsey, RN Outcome: Progressing 02/09/2022 2149 by Ancil Linsey, RN Outcome: Progressing   Problem: Metabolic: Goal: Ability to maintain appropriate glucose levels will improve 02/09/2022 2149 by Ancil Linsey, RN Outcome: Progressing 02/09/2022 2149 by Ancil Linsey, RN Outcome: Progressing   Problem: Nutritional: Goal: Maintenance of adequate nutrition will improve 02/09/2022 2149 by Ancil Linsey, RN Outcome: Progressing 02/09/2022 2149 by Ancil Linsey, RN Outcome: Progressing Goal: Progress toward achieving an optimal weight will improve 02/09/2022 2149 by Ancil Linsey, RN Outcome: Progressing 02/09/2022 2149 by Ancil Linsey, RN Outcome: Progressing   Problem:  Skin Integrity: Goal: Risk for impaired skin integrity will decrease 02/09/2022 2149 by Ancil Linsey, RN Outcome: Progressing 02/09/2022 2149 by Ancil Linsey, RN Outcome: Progressing   Problem: Tissue Perfusion: Goal: Adequacy of tissue perfusion will improve 02/09/2022 2149 by Ancil Linsey, RN Outcome: Progressing 02/09/2022 2149 by Ancil Linsey, RN Outcome: Progressing   Problem: Education: Goal: Knowledge of disease or condition will improve 02/09/2022 2149 by Ancil Linsey, RN Outcome: Progressing 02/09/2022 2149 by Ancil Linsey, RN Outcome: Progressing Goal: Knowledge of secondary prevention will improve (MUST DOCUMENT ALL) 02/09/2022 2149 by Ancil Linsey, RN Outcome: Progressing 02/09/2022 2149 by Ancil Linsey, RN Outcome: Progressing Goal: Knowledge of patient specific risk factors will improve Elta [place] N/A or DELETE if not current risk factor) 02/09/2022 2149 by Ancil Linsey, RN Outcome: Progressing 02/09/2022 2149 by Ancil Linsey, RN Outcome: Progressing   Problem: Ischemic Stroke/TIA Tissue Perfusion: Goal: Complications of ischemic stroke/TIA will be minimized 02/09/2022 2149 by Ancil Linsey, RN Outcome: Progressing 02/09/2022 2149 by Ancil Linsey, RN Outcome: Progressing   Problem: Coping: Goal: Will verbalize positive feelings about self 02/09/2022 2149 by Ancil Linsey, RN Outcome: Progressing 02/09/2022 2149 by Ancil Linsey, RN Outcome: Progressing Goal: Will identify appropriate support needs 02/09/2022 2149 by Ancil Linsey, RN Outcome: Progressing 02/09/2022 2149 by Ancil Linsey, RN Outcome: Progressing   Problem: Health Behavior/Discharge Planning: Goal: Ability to manage health-related needs will improve 02/09/2022 2149 by Ancil Linsey, RN Outcome: Progressing 02/09/2022 2149 by Ancil Linsey, RN Outcome: Progressing Goal: Goals will be collaboratively established with patient/family 02/09/2022  2149 by Ancil Linsey, RN Outcome: Progressing 02/09/2022 2149 by Ancil Linsey, RN Outcome: Progressing   Problem: Self-Care: Goal: Ability to participate in self-care as condition permits will improve 02/09/2022 2149 by Ancil Linsey, RN  Outcome: Progressing 02/09/2022 2149 by Ancil Linsey, RN Outcome: Progressing Goal: Verbalization of feelings and concerns over difficulty with self-care will improve 02/09/2022 2149 by Ancil Linsey, RN Outcome: Progressing 02/09/2022 2149 by Ancil Linsey, RN Outcome: Progressing Goal: Ability to communicate needs accurately will improve 02/09/2022 2149 by Ancil Linsey, RN Outcome: Progressing 02/09/2022 2149 by Ancil Linsey, RN Outcome: Progressing   Problem: Nutrition: Goal: Risk of aspiration will decrease 02/09/2022 2149 by Ancil Linsey, RN Outcome: Progressing 02/09/2022 2149 by Ancil Linsey, RN Outcome: Progressing Goal: Dietary intake will improve 02/09/2022 2149 by Ancil Linsey, RN Outcome: Progressing 02/09/2022 2149 by Ancil Linsey, RN Outcome: Progressing   Problem: Education: Goal: Knowledge of General Education information will improve Description: Including pain rating scale, medication(s)/side effects and non-pharmacologic comfort measures 02/09/2022 2149 by Ancil Linsey, RN Outcome: Progressing 02/09/2022 2149 by Ancil Linsey, RN Outcome: Progressing   Problem: Health Behavior/Discharge Planning: Goal: Ability to manage health-related needs will improve 02/09/2022 2149 by Ancil Linsey, RN Outcome: Progressing 02/09/2022 2149 by Ancil Linsey, RN Outcome: Progressing   Problem: Clinical Measurements: Goal: Ability to maintain clinical measurements within normal limits will improve 02/09/2022 2149 by Ancil Linsey, RN Outcome: Progressing 02/09/2022 2149 by Ancil Linsey, RN Outcome: Progressing Goal: Will remain free from infection 02/09/2022 2149 by Ancil Linsey, RN Outcome: Progressing 02/09/2022 2149 by Ancil Linsey, RN Outcome: Progressing Goal: Diagnostic test results will improve 02/09/2022 2149 by Ancil Linsey, RN Outcome: Progressing 02/09/2022 2149 by Ancil Linsey, RN Outcome: Progressing Goal: Respiratory complications will improve 02/09/2022 2149 by Ancil Linsey, RN Outcome: Progressing 02/09/2022 2149 by Ancil Linsey, RN Outcome: Progressing Goal: Cardiovascular complication will be avoided 02/09/2022 2149 by Ancil Linsey, RN Outcome: Progressing 02/09/2022 2149 by Ancil Linsey, RN Outcome: Progressing   Problem: Activity: Goal: Risk for activity intolerance will decrease 02/09/2022 2149 by Ancil Linsey, RN Outcome: Progressing 02/09/2022 2149 by Ancil Linsey, RN Outcome: Progressing   Problem: Nutrition: Goal: Adequate nutrition will be maintained 02/09/2022 2149 by Ancil Linsey, RN Outcome: Progressing 02/09/2022 2149 by Ancil Linsey, RN Outcome: Progressing   Problem: Coping: Goal: Level of anxiety will decrease 02/09/2022 2149 by Ancil Linsey, RN Outcome: Progressing 02/09/2022 2149 by Ancil Linsey, RN Outcome: Progressing   Problem: Elimination: Goal: Will not experience complications related to bowel motility 02/09/2022 2149 by Ancil Linsey, RN Outcome: Progressing 02/09/2022 2149 by Ancil Linsey, RN Outcome: Progressing Goal: Will not experience complications related to urinary retention 02/09/2022 2149 by Ancil Linsey, RN Outcome: Progressing 02/09/2022 2149 by Ancil Linsey, RN Outcome: Progressing   Problem: Pain Managment: Goal: General experience of comfort will improve 02/09/2022 2149 by Ancil Linsey, RN Outcome: Progressing 02/09/2022 2149 by Ancil Linsey, RN Outcome: Progressing   Problem: Safety: Goal: Ability to remain free from injury will improve 02/09/2022 2149 by Ancil Linsey, RN Outcome: Progressing 02/09/2022  2149 by Ancil Linsey, RN Outcome: Progressing   Problem: Skin Integrity: Goal: Risk for impaired skin integrity will decrease 02/09/2022 2149 by Ancil Linsey, RN Outcome: Progressing 02/09/2022 2149 by Ancil Linsey, RN Outcome: Progressing   Problem: Education: Goal: Knowledge of disease or condition will improve Outcome: Progressing Goal: Knowledge of secondary prevention will improve (MUST DOCUMENT ALL) Outcome: Progressing Goal: Knowledge of patient specific risk factors will improve Elta [place] N/A or DELETE if not current risk  factor) Outcome: Progressing   Problem: Ischemic Stroke/TIA Tissue Perfusion: Goal: Complications of ischemic stroke/TIA will be minimized Outcome: Progressing   Problem: Coping: Goal: Will verbalize positive feelings about self Outcome: Progressing Goal: Will identify appropriate support needs Outcome: Progressing   Problem: Health Behavior/Discharge Planning: Goal: Ability to manage health-related needs will improve Outcome: Progressing Goal: Goals will be collaboratively established with patient/family Outcome: Progressing   Problem: Self-Care: Goal: Ability to participate in self-care as condition permits will improve Outcome: Progressing Goal: Verbalization of feelings and concerns over difficulty with self-care will improve Outcome: Progressing Goal: Ability to communicate needs accurately will improve Outcome: Progressing   Problem: Nutrition: Goal: Risk of aspiration will decrease Outcome: Progressing Goal: Dietary intake will improve Outcome: Progressing

## 2022-02-09 NOTE — H&P (Signed)
History and Physical    Patient: Steve Richards RCB:[id number] DOB: Feb 28, 1966 DOA: 02/08/2022 DOS: the patient was seen and examined on 02/09/2022 PCP: Girtha Rm, NP-C  Patient coming from: Home - lives alone; NOK: Juanetta Beets, friend, [phone]; sister, Zaveon Gillen, [phone]    Chief Complaint: Neurologic symptoms  HPI: Steve Richards is a 56 y.o. male with medical history significant of chronic combined CHF; DM; hypothyroidism; cirrhosis with ascites; and h/o AVR presenting with  progressive neurologic symptoms.  He was seen at [facility] for diplopia on 11/27 and he was referred to ophthalmology and neurology as well as his PCP.  He came to the ER with the same issue on 11/28 and had a negative MRI, was referred to ENT and ophthalmology.   He has continued to have diplopia with R hemianopsia as well as ataxia, aphasia, and dyscoordination.      ER Course:  Carryover, per Dr. Alcario Drought:  Cc: hx of right hemianopsia, ataxia, + romberg / subacute progressive signs and symptoms admit for expedited work up , was being evaluated as outpatient  Neuro rec admit for further evaluation of neuro disorder nos ? CJD       Review of Systems: As mentioned in the history of present illness. All other systems reviewed and are negative.  Limited by aphasia.  Past Medical History:  Diagnosis Date   Ascites 07/15/2019   Cellulitis and abscess of left lower extremity 07/21/2019   Chronic combined systolic (congestive) and diastolic (congestive) heart failure (HCC)    Cirrhosis of liver with ascites (HCC)    Hepatic cirrhosis ([place]) 07/15/2019   Hyperglycemia 07/21/2019   Left kidney mass 07/15/2019   Morbid obesity ([place])    Pleural effusion on right 07/15/2019   S/P aortic valve replacement with bioprosthetic valve 08/06/2019   Size 25 mm // Echocardiogram 8/21: EF 55-60, mod LVH, Gr 2 DD, normal RVSF, mod LAE, trivial MR, AVR w mean 13 mmHg and no PVL   Splenomegaly 91/69/4503   Systolic  ejection murmur 07/15/2019   Past Surgical History:  Procedure Laterality Date   AORTIC VALVE REPLACEMENT N/A 08/06/2019   Procedure: AORTIC VALVE REPLACEMENT (AVR) using Margaretha Sheffield Resilia 25 MM Aortic Valve.;  Surgeon: Gaye Pollack, MD;  Location: [facility] OR;  Service: Open Heart Surgery;  Laterality: N/A;   CARDIAC CATHETERIZATION     EXPLORATION POST OPERATIVE OPEN HEART N/A 08/06/2019   Procedure: EXPLORATION POST OPERATIVE OPEN HEART;  Surgeon: Gaye Pollack, MD;  Location: [facility];  Service: Open Heart Surgery;  Laterality: N/A;   IR THORACENTESIS ASP PLEURAL SPACE W/IMG GUIDE  06/25/2019   RIGHT/LEFT HEART CATH AND CORONARY ANGIOGRAPHY N/A 07/28/2019   Procedure: RIGHT/LEFT HEART CATH AND CORONARY ANGIOGRAPHY;  Surgeon: Sherren Mocha, MD;  Location: [facility];  Service: Cardiovascular;  Laterality: N/A;   TEE WITHOUT CARDIOVERSION N/A 08/06/2019   Procedure: TRANSESOPHAGEAL ECHOCARDIOGRAM (TEE);  Surgeon: Gaye Pollack, MD;  Location: [place] [facility];  Service: Open Heart Surgery;  Laterality: N/A;   WISDOM TOOTH EXTRACTION     college age   WRIST FRACTURE SURGERY     grade school   Social History:  reports that he has never smoked. He has never used smokeless tobacco. He reports current alcohol use. He reports that he does not use drugs.  No Known Allergies  Family History  Problem Relation Age of Onset   Osteoporosis Mother    Other Father        died in a  hurricane    Prior to Admission medications   Medication Sig Start Date End Date Taking? Authorizing Provider  acetaminophen (TYLENOL) 325 MG tablet Take 2 tablets (650 mg total) by mouth every 6 (six) hours as needed for mild pain. 08/17/19  Yes Elgie Collard, PA-C  aspirin EC 81 MG tablet Take 1 tablet (81 mg total) by mouth daily. Swallow whole. 01/12/20  Yes Jerline Pain, MD  Multiple Vitamin (MULTIVITAMIN) tablet Take 1 tablet by mouth daily.   Yes [provider]  levothyroxine (SYNTHROID) 88 MCG tablet  Take 1 tablet (88 mcg total) by mouth daily. Patient not taking: Reported on 02/08/2022 01/18/22   Harland Dingwall L, NP-C  metFORMIN (GLUCOPHAGE) 500 MG tablet Take 1 tablet (500 mg total) by mouth 2 (two) times daily with a meal. Patient not taking: Reported on 02/08/2022 11/21/21   Girtha Rm, NP-C  Vitamin D, Ergocalciferol, (DRISDOL) 1.25 MG (50000 UNIT) CAPS capsule Take 1 capsule (50,000 Units total) by mouth every 7 (seven) days. Patient not taking: Reported on 02/09/2022 11/21/21   Girtha Rm, NP-C    Physical Exam: Vitals:   02/09/22 1245 02/09/22 1517 02/09/22 1600 02/09/22 1800  BP: (!) 130/102  (!) 153/101 (!) 185/175  Pulse: 82  77 92  Resp: (!) 22  13 (!) 27  Temp:  97.8 F (36.6 C)    TempSrc:      SpO2: 99%  96% 97%  Weight:      Height:       General:  Appears calm and comfortable and is in NAD Eyes:  PERRL, EOMI, normal lids, iris but with c/o visual impairment, R hemianopsia ENT:  grossly normal hearing, lips & tongue, mmm; appropriate dentition Neck:  no LAD, masses or thyromegaly Cardiovascular:  RRR, no m/r/g. No LE edema.  Respiratory:   CTA bilaterally with no wheezes/rales/rhonchi.  Normal respiratory effort. Abdomen:  soft, NT, ND Back:   normal alignment, no CVAT Skin:  no rash or induration seen on limited exam Musculoskeletal:  grossly normal tone BUE/BLE, good ROM, no bony abnormality Psychiatric: blunted mood and affect, speech fluent and appropriate with episodic aphasia, AOx3 Neurologic:  CN 2-12 grossly intact, moves all extremities in coordinated fashion, RUE > RLE tremor   Radiological Exams on Admission: Independently reviewed - see discussion in A/P where applicable  EEG adult  Result Date: 02/09/2022 Landry Corporal, MD     02/09/2022 11:40 AM [facility] Routine EEG Report Video Performed: Performed Demographics: Patient Name:   Steve Richards Date of Birth:   March 14, 1965  Identification Number:   MRN - [id number] Study Times: Study Start Time:   02/09/2022 10:18:53 Study End Time:   02/09/2022 11:09:09 Duration:   50 minutes Indication(s): Spells, Eval for Seizures Technical Summary: This EEG was performed utilizing standard International 10-20 System of electrode placement. Data were obtained and interpreted utilizing referential montage recording, with reformatting to longitudinal, transverse bipolar, and referential montages as necessary for interpretation. State(s):      Drowsy      Asleep Activation Procedures: Hyperventilation: Not performed Photic Stimulation: Not performed EEG Description: Awake background of 6 to 7 Hz is occasionally present. Intermittent generalized theta-delta slowing was present. Excess continuous generalized theta-delta slowing was present. No normal sleep architecture is noted. Muscle, motion, electrode, and eye movement artifacts were occasionally noted. Prominent salt bridging versus sweat artifact is noted. Impression: Abnormal EEG due to:   1) Theta range background slowing  2)  Intermittent generalized theta-delta slowing  3) Continuous generalized theta-delta slowing Clinical Correlation: This EEG is suggestive of a mild-moderate encephalopathy, but is nonspecific as to etiology. The absence of epileptiform abnormalities does not preclude a clinical diagnosis of seizures. [facility] For Inpatient follow-up with [facility] physician please call [facility] [phone]. This is not an outpatient service. Post hospital discharge, please contact hospital directly.   MR BRAIN W WO CONTRAST  Result Date: 02/09/2022 CLINICAL DATA:  56 year old male with progressive neurologic deficits since last month. Aphasia, abnormal gait, right arm tremor, now with right lower extremity tremor and progressive. And progressive right greater than left hemianopsia. Neurology considering autoimmune versus paraneoplastic etiology versus CJD. History of cirrhosis.  EXAM: MRI HEAD WITHOUT AND WITH CONTRAST TECHNIQUE: Multiplanar, multiecho pulse sequences of the brain and surrounding structures were obtained without and with intravenous contrast. CONTRAST:  23m GADAVIST GADOBUTROL 1 MMOL/ML IV SOLN COMPARISON:  Head CT yesterday. Brain MRI without and with contrast 01/30/2022. FINDINGS: Brain: Cerebral volume appears stable and within normal limits for age. But along the right superior frontal gyrus there is a seemingly small new nodular roughly 14 mm extra-axial lesion with FLAIR hyperintensity (series 11, image 24), partial intrinsic T1 hyperintensity (series 16, image 49) and questionable enhancement (series 19, images 19 and 20) which is subtle on T2 and diffusion but was not clearly visible on MRI last month. This has a nonspecific intermediate density appearance on MRI this morning, but demonstrates pronounced susceptibility on SWI (series 14, image 49) and is therefore suspicious for trace subarachnoid hemorrhage. Adjacent superior sagittal sinus seems unremarkable. Punctate chronic microhemorrhage in the left frontal lobe white matter and right parietal lobe on series 14, image 35 are stable from last month but more conspicuous than on the T2* imaging at that time. And there appear to be 2 additional punctate chronic microhemorrhages in the frontal lobe white matter also on image 35. But no other chronic cerebral blood products. On this 1.5 Tesla exam today there is suspicious asymmetric cortical increased diffusion affecting multiple sites in the left hemisphere, 1 of the most conspicuous areas is the left parietal lobe (series 5, image 87). This might have been present last month, but as on that exam there is no convincing T2 or FLAIR cortex correlation, no convincing gyral edema. Similar subtle asymmetry present on the coronal DWI. No abnormal enhancement identified. No dural thickening. Background gray and white matter signal remains normal otherwise. No restricted  diffusion suggestive of acute infarction. No midline shift, mass effect. No ventriculomegaly. No ventricular debris. Cervicomedullary junction and pituitary are within normal limits. No intrinsic T1 hyperintensity in the deep gray nuclei. Vascular: Major intracranial vascular flow voids are stable, preserved. The major dural venous sinuses are enhancing and appear to be patent. Skull and upper cervical spine: Negative. Visualized bone marrow signal is within normal limits. Sinuses/Orbits: Rightward gaze. Normal suprasellar cistern and unremarkable optic chiasm. Chronic appearing left maxillary wall deformity. Left maxillary and ethmoid mucosal thickening. Other: Mastoids remain clear. Visible internal auditory structures appear normal. IMPRESSION: 1. Small 14 mm focus suspicious for subarachnoid hemorrhage at the right vertex adjacent to the falx. This was subtle on the earlier CT, not apparent on MRI last month. Etiology and significance is unclear, has there been recent fall? No other acute intracranial hemorrhage is identified, but there is a small number of nonspecific chronic microhemorrhages in both hemispheres. 2. Otherwise a largely unremarkable MRI appearance of the brain, HOWEVER, suspicion of subtle abnormal cortical diffusion widely scattered in  the left hemisphere. Although with no cortical edema or abnormal signal on the remaining sequences. No enhancement. Given the clinical suspicion Creutzfeldt-Jakob Disease cannot be excluded. Differential diagnosis would include autoimmune or paraneoplastic encephalitis - although absence of T2/FLAIR abnormality argues against those. Study discussed by telephone with Dr. Donnetta Simpers on 02/09/2022 at 06:48. Electronically Signed   By: Genevie Ann M.D.   On: 02/09/2022 06:49   CT HEAD WO CONTRAST  Result Date: 02/08/2022 CLINICAL DATA:  Neuro deficit, acute, stroke suspected. Expressive aphasia, abnormal gait. EXAM: CT HEAD WITHOUT CONTRAST TECHNIQUE:  Contiguous axial images were obtained from the base of the skull through the vertex without intravenous contrast. RADIATION DOSE REDUCTION: This exam was performed according to the departmental dose-optimization program which includes automated exposure control, adjustment of the mA and/or kV according to patient size and/or use of iterative reconstruction technique. COMPARISON:  MRI 01/30/2022 FINDINGS: Brain: Normal anatomic configuration. No abnormal intra or extra-axial mass lesion or fluid collection. No abnormal mass effect or midline shift. No evidence of acute intracranial hemorrhage or infarct. Ventricular size is normal. Cerebellum unremarkable. Vascular: Unremarkable Skull: Intact Sinuses/Orbits: Air-fluid level within the left maxillary sinus has resolved though mild mucosal thickening persists. Dense opacification of the left frontal sinus and several left ethmoid air cells persists, however. Remaining paranasal sinuses are clear. Orbits are unremarkable. Other: Mastoid air cells and middle ear cavities are clear. IMPRESSION: 1. No acute intracranial abnormality. No calvarial fracture. 2. Improved left maxillary sinus disease. Persistent left frontal and ethmoid paranasal sinus disease. Electronically Signed   By: Fidela Salisbury M.D.   On: 02/08/2022 19:30    EKG: Independently reviewed.  NSR with rate 89; no evidence of acute ischemia   Labs on Admission: I have personally reviewed the available labs and imaging studies at the time of the admission.  Pertinent labs:    Glucose 118 Unremarkable CBC INR 1.0 ETOH <10 CSF: 76 glucose, 13, 390 RBC, 69 protein   Assessment and Plan: Principal Problem:   Hemianopsia Active Problems:   Hepatic cirrhosis (HCC)   Class 2 obesity due to excess calories with body mass index (BMI) of 35.0 to 35.9 in adult   Controlled type 2 diabetes mellitus with complication, without long-term current use of insulin (HCC)   Stroke-like symptoms   SAH  (subarachnoid hemorrhage) (HCC)    Stroke-like symptoms -Patient with progressive visual impairment (diplopia, hemianopsia), ataxia, dyscoordination, and aphasia -Neurology is consulting -He is s/p LP, viral tests negative -MRI with SAH, no apparent falls, ?also small chronic microhemorrhages and "subtle abnormal cortical diffusion widely scattered in the left hemisphere" -Repeat CT at 24 hours to ensure stability -Broad ddx including CJD -Send out tests are pending -Will admit to neuroprogressive and continue to follow for now -Will need PT/OT/ST consults -He may benefit from CIR -Hold ASA  Chronic combined CHF -10/12/21 echo with normalization of EF and diastolic function -Appears compensated now   DM -Recent A1c was 7.1, indicating good control -hold Glucophage -Cover with moderate-scale SSI   Hypothyroidism -Recent normal thyroid testing -No longer taking Synthroid  Cirrhosis -ho ascites, none currently  Obesity -He is actually 22 kg less than he weighed when I saw him in 07/2019; praise provided -Ongoing weight loss efforts should be encouraged    Advance Care Planning:   Code Status: Full Code   Consults: Neurology; PT/OT/ST; Nutrition; TOC team  DVT Prophylaxis: SCDs  Family Communication: None present; he declined to have me call family at the time  of admission  Severity of Illness: The appropriate patient status for this patient is INPATIENT. Inpatient status is judged to be reasonable and necessary in order to provide the required intensity of service to ensure the patient's safety. The patient's presenting symptoms, physical exam findings, and initial radiographic and laboratory data in the context of their chronic comorbidities is felt to place them at high risk for further clinical deterioration. Furthermore, it is not anticipated that the patient will be medically stable for discharge from the hospital within 2 midnights of admission.   * I certify that at  the point of admission it is my clinical judgment that the patient will require inpatient hospital care spanning beyond 2 midnights from the point of admission due to high intensity of service, high risk for further deterioration and high frequency of surveillance required.*  Author: Karmen Bongo, MD 02/09/2022 6:06 PM  For on call review [email or web address].

## 2022-02-09 NOTE — Progress Notes (Signed)
EEG complete - results pending 

## 2022-02-09 NOTE — Procedures (Signed)
[facility]  Routine EEG Report  Video Performed: Performed  Demographics: Patient Name:   Steve Richards, Steve Richards  Date of Birth:   1965/06/11  Identification Number:   MRN - [id number]  Study Times:  Study Start Time:   02/09/2022 10:18:53 Study End Time:   02/09/2022 11:09:09  Duration:   50 minutes  Indication(s): Spells, Eval for Seizures  Technical Summary:  This EEG was performed utilizing standard International 10-20 System of electrode placement. Data were obtained and interpreted utilizing referential montage recording, with reformatting to longitudinal, transverse bipolar, and referential montages as necessary for interpretation.  State(s):      Drowsy      Asleep   Activation Procedures: Hyperventilation: Not performed Photic Stimulation: Not performed   EEG Description:  Awake background of 6 to 7 Hz is occasionally present. Intermittent generalized theta-delta slowing was present. Excess continuous generalized theta-delta slowing was present. No normal sleep architecture is noted. Muscle, motion, electrode, and eye movement artifacts were occasionally noted. Prominent salt bridging versus sweat artifact is noted.  Impression:  Abnormal EEG due to:    1) Theta range background slowing  2) Intermittent generalized theta-delta slowing  3) Continuous generalized theta-delta slowing   Clinical Correlation:  This EEG is suggestive of a mild-moderate encephalopathy, but is nonspecific as to etiology. The absence of epileptiform abnormalities does not preclude a clinical diagnosis of seizures.    [facility] For Inpatient follow-up with [facility] physician please call [facility] [phone]. This is not an outpatient service. Post hospital discharge, please contact hospital directly.

## 2022-02-09 NOTE — ED Notes (Signed)
Patient taken to MRI at this time 

## 2022-02-10 DIAGNOSIS — H5347 Heteronymous bilateral field defects: Secondary | ICD-10-CM | POA: Diagnosis not present

## 2022-02-10 DIAGNOSIS — R270 Ataxia, unspecified: Secondary | ICD-10-CM | POA: Diagnosis not present

## 2022-02-10 LAB — GLUCOSE, CAPILLARY
Glucose-Capillary: 137 mg/dL — ABNORMAL HIGH (ref 70–99)
Glucose-Capillary: 131 mg/dL — ABNORMAL HIGH (ref 70–99)
Glucose-Capillary: 132 mg/dL — ABNORMAL HIGH (ref 70–99)
Glucose-Capillary: 227 mg/dL — ABNORMAL HIGH (ref 70–99)

## 2022-02-10 LAB — LIPID PANEL
Cholesterol: 176 mg/dL (ref 0–200)
HDL: 30 mg/dL — ABNORMAL LOW (ref >40)
LDL Cholesterol: 107 mg/dL — ABNORMAL HIGH (ref 0–99)
Total CHOL/HDL Ratio: 5.9 RATIO
Triglycerides: 194 mg/dL — ABNORMAL HIGH (ref <150)
VLDL: 39 mg/dL (ref 0–40)

## 2022-02-10 LAB — TSH: TSH: 9.391 u[IU]/mL — ABNORMAL HIGH (ref 0.350–4.500)

## 2022-02-10 LAB — C-REACTIVE PROTEIN: CRP: <0.5 mg/dL (ref <1.0)

## 2022-02-10 LAB — ANTI-DNA ANTIBODY, DOUBLE-STRANDED: ds DNA Ab: <1 IU/mL (ref 0–9)

## 2022-02-10 LAB — ANGIOTENSIN CONVERTING ENZYME: Angiotensin-Converting Enzyme: 24 U/L (ref 14–82)

## 2022-02-10 LAB — ANTINUCLEAR ANTIBODIES, IFA: ANA Ab, IFA: NEGATIVE

## 2022-02-10 LAB — T4, FREE: Free T4: 0.62 ng/dL (ref 0.61–1.12)

## 2022-02-10 MED ORDER — SODIUM CHLORIDE 0.9 % IV SOLN
1000.0000 mg | INTRAVENOUS | Status: AC
  Administered 2022-02-10 – 2022-02-14 (×5): 1000 mg via INTRAVENOUS
  Filled 2022-02-10 (×5): qty 16

## 2022-02-10 MED ORDER — ASPIRIN 81 MG PO TBEC: 81.0000 mg | DELAYED_RELEASE_TABLET | Freq: Every day | ORAL | Status: DC

## 2022-02-10 MED ORDER — LEVOTHYROXINE SODIUM 100 MCG PO TABS
100.0000 ug | ORAL_TABLET | Freq: Every day | ORAL | Status: DC
  Administered 2022-02-10 – 2022-02-22 (×12): 100 ug via ORAL
  Filled 2022-02-10 (×13): qty 1

## 2022-02-10 MED ORDER — PANTOPRAZOLE SODIUM 40 MG PO TBEC
40.0000 mg | DELAYED_RELEASE_TABLET | Freq: Every day | ORAL | Status: DC
  Administered 2022-02-10 – 2022-02-22 (×12): 40 mg via ORAL
  Filled 2022-02-10 (×13): qty 1

## 2022-02-10 MED ORDER — LEVOTHYROXINE SODIUM 88 MCG PO TABS: 88.0000 ug | ORAL_TABLET | Freq: Every day | ORAL | Status: DC

## 2022-02-10 MED ORDER — HEPARIN SODIUM (PORCINE) 5000 UNIT/ML IJ SOLN: 5000.0000 [IU] | Freq: Three times a day (TID) | INTRAMUSCULAR | Status: DC

## 2022-02-10 MED ORDER — ADULT MULTIVITAMIN W/MINERALS CH
1.0000 | ORAL_TABLET | Freq: Every day | ORAL | Status: DC
  Administered 2022-02-10 – 2022-02-22 (×12): 1 via ORAL
  Filled 2022-02-10 (×13): qty 1

## 2022-02-10 NOTE — Progress Notes (Addendum)
PROGRESS NOTE                                                                                                                                                                                                             Patient Demographics:    Steve Richards, is a 56 y.o. male, DOB - Jul 12, 1965, OER:[id number]  Outpatient Primary MD for the patient is Steve Rm, NP-C    LOS - 1  Admit date - 02/08/2022    Chief Complaint  Patient presents with   Stroke Symptoms       Brief Narrative (HPI from H&P)    56 y.o. male with PMH significant for hepatic cirrhosis, morbid obesity, aortic valve replacement with bioprosthetic valve, who presents with word finding difficulty, tremor and vision abnormalities.  He has been having some weakness in his left hand for the past few months had some outpatient neurological workup initially few months ago but could not remain compliant with it.  For the last few weeks he has been having some tremor-like activity in his right hand, he has developed right eye visual problem for which she saw an ophthalmologist and was noted to find right-sided hemianopsia, he also has noticed some problems finding words.   Presented to the ER where MRI was concerning for subtle cortical ribboning suspicious for autoimmune, paraneoplastic etiology vs could be CJD or infectious.  He was seen by neurologist and admitted to the hospital.    Subjective:    Steve Richards today has, No headache, No chest pain, No abdominal pain - No Nausea, No new weakness tingling or numbness, no shortness of breath, continues to have right eye visual field defect.   Assessment  & Plan :   L. hand weakness presumably from ? pinched nerve in summer 2023 who presents with expressive aphasia, ataxia and tremor with right worse than left and R hemianopsia that has progressively worsened since 01/18/22.  Note symptoms ongoing since summer 2023,  patient not compliant with outpatient neurological workup, EEG stable, CT head and MRI noted findings suspicious for  autoimmune, paraneoplastic etiology vs could be CJD or infectious.  Seen by neurology underwent LP shows elevated protein, specific testing for CGD etc. ordered by neurology - CSF autoimmune encephalopathy panel, Oligoclonal bands and IgG index, Lyme PCR, VDRL CSF, protein 14-3-3 and T-Tau with reflex  to RT-Quic, CSF ACE.  Continue PT OT and supportive care.  Seen by speech as well.  Continue to monitor closely.   HX of bioprosthetic AVR.  Monitor with supportive care.  Stable prosthesis in aortic valve physiology on last echo few months ago.  Chronic diastolic CHF EF of 29% on echo done few months ago.  Compensated.   Obesity.  BMI of 35.  Follow-up with PCP.  History of cirrhosis of liver.  Question NASH.  Stable INR and LFTs, outpatient GI workup.    Polycythemia.  Likely due to OSA, monitor.  Small stable subarachnoid hemorrhage noted on MRI.  Symptom-free.  Monitor.  Holding aspirin or anticoagulation for now.  Hypothyroidism.  Home dose Synthroid increased, TSH was elevated, check free T4.  DM type II.  SSI.  Lab Results  Component Value Date   HGBA1C 7.1 (H) 11/16/2021   CBG (last 3)  Recent Labs    02/09/22 1129 02/09/22 1634 02/10/22 0840  GLUCAP 126* 109* 137*         Condition - Fair  Family Communication  :  Steve Richards-[phone] on 02/10/22  Code Status :  Full  Consults  :  Neuro  PUD Prophylaxis :    Procedures  :     LP 02/09/22  MRI - 1. Small 14 mm focus suspicious for subarachnoid hemorrhage at the right vertex adjacent to the falx. This was subtle on the earlier CT, not apparent on MRI last month. Etiology and significance is unclear, has there been recent fall? No other acute intracranial hemorrhage is identified, but there is a small number of nonspecific chronic microhemorrhages in both hemispheres. 2. Otherwise a largely  unremarkable MRI appearance of the brain, HOWEVER, suspicion of subtle abnormal cortical diffusion widely scattered in the left hemisphere. Although with no cortical edema or abnormal signal on the remaining sequences. No enhancement. Given the clinical suspicion Creutzfeldt-Jakob Disease cannot be excluded. Differential diagnosis would include autoimmune or paraneoplastic encephalitis       Disposition Plan  :    Status is: Inpatient  DVT Prophylaxis  :    Place and maintain sequential compression device Start: 02/09/22 1757   Lab Results  Component Value Date   PLT 202 02/08/2022    Diet :  Diet Order             Diet heart healthy/carb modified Room service appropriate? Yes; Fluid consistency: Thin  Diet effective ____                    Inpatient Medications  Scheduled Meds:  insulin aspart  0-15 Units Subcutaneous TID WC   insulin aspart  0-5 Units Subcutaneous QHS   Continuous Infusions:  sodium chloride 50 mL/hr at 02/10/22 0600   PRN Meds:.acetaminophen **OR** acetaminophen (TYLENOL) oral liquid 160 mg/5 mL **OR** acetaminophen, senna-docusate  Antibiotics  :    Anti-infectives (From admission, onward)    None         Objective:   Vitals:   02/10/22 0400 02/10/22 0416 02/10/22 0600 02/10/22 0800  BP:  130/87  (!) 144/95  Pulse: 62 71 63 74  Resp: 16 18 18 16   Temp:  97.9 F (36.6 C)  98 F (36.7 C)  TempSrc:  Oral  Oral  SpO2: 97% 98% 94% 98%  Weight:      Height:        Wt Readings from Last 3 Encounters:  02/09/22 120 kg  02/08/22 118.4 kg  01/31/22  118.4 kg     Intake/Output Summary (Last 24 hours) at 02/10/2022 1016 Last data filed at 02/10/2022 0600 Gross per 24 hour  Intake 1380.42 ml  Output 1100 ml  Net 280.42 ml     Physical Exam  Awake, alert, mild expressive aphasia, right hemianopsia, mild tremor [place].AT,PERRAL Supple Neck, No JVD,   Symmetrical Chest wall movement, Good air movement bilaterally, CTAB RRR,No  Gallops,Rubs or new Murmurs,  +ve B.Sounds, Abd Soft, No tenderness,   No Cyanosis, Clubbing or edema         Data Review:    Recent Labs  Lab 02/08/22 1818 02/08/22 1837  WBC 8.2  --   HGB 17.8* 18.7*  HCT 52.7* 55.0*  PLT 202  --   MCV 89.5  --   MCH 30.2  --   MCHC 33.8  --   RDW 13.2  --   LYMPHSABS 1.6  --   MONOABS 0.7  --   EOSABS 0.1  --   BASOSABS 0.0  --     Recent Labs  Lab 02/08/22 1818 02/08/22 1837 02/10/22 0616  NA 135 138  --   K 3.9 3.9  --   CL 100 102  --   CO2 23  --   --   GLUCOSE 118* 118*  --   BUN 9 11  --   CREATININE 0.85 0.80  --   AST 42*  --   --   ALT 31  --   --   ALKPHOS 49  --   --   BILITOT 1.0  --   --   ALBUMIN 4.1  --   --   CRP  --   --  <0.5  INR 1.0  --   --   TSH  --   --  9.391*  CALCIUM 9.2  --   --    ----------------------------------------------------------------------------------------------------------------- Recent Labs    02/10/22 0228  CHOL 176  HDL 30*  LDLCALC 107*  TRIG 194*  CHOLHDL 5.9    Lab Results  Component Value Date   HGBA1C 7.1 (H) 11/16/2021    Recent Labs    02/10/22 0616  TSH 9.391*      Radiology Reports CT HEAD WO CONTRAST (5MM)  Result Date: 02/09/2022 CLINICAL DATA:  Follow-up subarachnoid hemorrhage EXAM: CT HEAD WITHOUT CONTRAST TECHNIQUE: Contiguous axial images were obtained from the base of the skull through the vertex without intravenous contrast. RADIATION DOSE REDUCTION: This exam was performed according to the departmental dose-optimization program which includes automated exposure control, adjustment of the mA and/or kV according to patient size and/or use of iterative reconstruction technique. COMPARISON:  CT 02/08/2022 and MR brain 02/09/2022 FINDINGS: Brain: The small focus along the right superior frontal gyrus of suspected subarachnoid hemorrhage appears unchanged from the CT dated 02/08/2022. This measures approximately 1.1 cm on sagittal image 28/6.  Previously 1.3 cm. No new areas of hemorrhage identified. No signs of acute stroke. Sulci and ventricular volumes appear normal. Vascular: No hyperdense vessel or unexpected calcification. Skull: Normal. Negative for fracture or focal lesion. Sinuses/Orbits: Chronic left maxillary and ethmoid mucosal thickening. Mastoid air cells are clear. Other: None. IMPRESSION: 1. Stable appearance of small focus of suspected subarachnoid hemorrhage along the right superior frontal gyrus. 2. No new areas of hemorrhage identified. 3. Chronic left maxillary and ethmoid sinus disease. Electronically Signed   By: Kerby Moors M.D.   On: 02/09/2022 18:42   EEG adult  Result Date: 02/09/2022 Blair Promise  F, MD     02/09/2022 11:40 AM [facility] Routine EEG Report Video Performed: Performed Demographics: Patient Name:   [place], Serpe Date of Birth:   06/25/1965 Identification Number:   MRN - [id number] Study Times: Study Start Time:   02/09/2022 10:18:53 Study End Time:   02/09/2022 11:09:09 Duration:   50 minutes Indication(s): Spells, Eval for Seizures Technical Summary: This EEG was performed utilizing standard International 10-20 System of electrode placement. Data were obtained and interpreted utilizing referential montage recording, with reformatting to longitudinal, transverse bipolar, and referential montages as necessary for interpretation. State(s):      Drowsy      Asleep Activation Procedures: Hyperventilation: Not performed Photic Stimulation: Not performed EEG Description: Awake background of 6 to 7 Hz is occasionally present. Intermittent generalized theta-delta slowing was present. Excess continuous generalized theta-delta slowing was present. No normal sleep architecture is noted. Muscle, motion, electrode, and eye movement artifacts were occasionally noted. Prominent salt bridging versus sweat artifact is noted. Impression: Abnormal EEG due to:   1) Theta range  background slowing  2) Intermittent generalized theta-delta slowing  3) Continuous generalized theta-delta slowing Clinical Correlation: This EEG is suggestive of a mild-moderate encephalopathy, but is nonspecific as to etiology. The absence of epileptiform abnormalities does not preclude a clinical diagnosis of seizures. [facility] For Inpatient follow-up with [facility] physician please call [facility] [phone]. This is not an outpatient service. Post hospital discharge, please contact hospital directly.   MR BRAIN W WO CONTRAST  Result Date: 02/09/2022 CLINICAL DATA:  56 year old male with progressive neurologic deficits since last month. Aphasia, abnormal gait, right arm tremor, now with right lower extremity tremor and progressive. And progressive right greater than left hemianopsia. Neurology considering autoimmune versus paraneoplastic etiology versus CJD. History of cirrhosis. EXAM: MRI HEAD WITHOUT AND WITH CONTRAST TECHNIQUE: Multiplanar, multiecho pulse sequences of the brain and surrounding structures were obtained without and with intravenous contrast. CONTRAST:  28m GADAVIST GADOBUTROL 1 MMOL/ML IV SOLN COMPARISON:  Head CT yesterday. Brain MRI without and with contrast 01/30/2022. FINDINGS: Brain: Cerebral volume appears stable and within normal limits for age. But along the right superior frontal gyrus there is a seemingly small new nodular roughly 14 mm extra-axial lesion with FLAIR hyperintensity (series 11, image 24), partial intrinsic T1 hyperintensity (series 16, image 49) and questionable enhancement (series 19, images 19 and 20) which is subtle on T2 and diffusion but was not clearly visible on MRI last month. This has a nonspecific intermediate density appearance on MRI this morning, but demonstrates pronounced susceptibility on SWI (series 14, image 49) and is therefore suspicious for trace subarachnoid hemorrhage. Adjacent superior sagittal sinus seems unremarkable. Punctate  chronic microhemorrhage in the left frontal lobe white matter and right parietal lobe on series 14, image 35 are stable from last month but more conspicuous than on the T2* imaging at that time. And there appear to be 2 additional punctate chronic microhemorrhages in the frontal lobe white matter also on image 35. But no other chronic cerebral blood products. On this 1.5 Tesla exam today there is suspicious asymmetric cortical increased diffusion affecting multiple sites in the left hemisphere, 1 of the most conspicuous areas is the left parietal lobe (series 5, image 87). This might have been present last month, but as on that exam there is no convincing T2 or FLAIR cortex correlation, no convincing gyral edema. Similar subtle asymmetry present on the coronal DWI. No abnormal enhancement identified. No dural thickening. Background gray and white  matter signal remains normal otherwise. No restricted diffusion suggestive of acute infarction. No midline shift, mass effect. No ventriculomegaly. No ventricular debris. Cervicomedullary junction and pituitary are within normal limits. No intrinsic T1 hyperintensity in the deep gray nuclei. Vascular: Major intracranial vascular flow voids are stable, preserved. The major dural venous sinuses are enhancing and appear to be patent. Skull and upper cervical spine: Negative. Visualized bone marrow signal is within normal limits. Sinuses/Orbits: Rightward gaze. Normal suprasellar cistern and unremarkable optic chiasm. Chronic appearing left maxillary wall deformity. Left maxillary and ethmoid mucosal thickening. Other: Mastoids remain clear. Visible internal auditory structures appear normal. IMPRESSION: 1. Small 14 mm focus suspicious for subarachnoid hemorrhage at the right vertex adjacent to the falx. This was subtle on the earlier CT, not apparent on MRI last month. Etiology and significance is unclear, has there been recent fall? No other acute intracranial hemorrhage is  identified, but there is a small number of nonspecific chronic microhemorrhages in both hemispheres. 2. Otherwise a largely unremarkable MRI appearance of the brain, HOWEVER, suspicion of subtle abnormal cortical diffusion widely scattered in the left hemisphere. Although with no cortical edema or abnormal signal on the remaining sequences. No enhancement. Given the clinical suspicion Creutzfeldt-Jakob Disease cannot be excluded. Differential diagnosis would include autoimmune or paraneoplastic encephalitis - although absence of T2/FLAIR abnormality argues against those. Study discussed by telephone with Dr. Donnetta Simpers on 02/09/2022 at 06:48. Electronically Signed   By: Genevie Ann M.D.   On: 02/09/2022 06:49   CT HEAD WO CONTRAST  Result Date: 02/08/2022 CLINICAL DATA:  Neuro deficit, acute, stroke suspected. Expressive aphasia, abnormal gait. EXAM: CT HEAD WITHOUT CONTRAST TECHNIQUE: Contiguous axial images were obtained from the base of the skull through the vertex without intravenous contrast. RADIATION DOSE REDUCTION: This exam was performed according to the departmental dose-optimization program which includes automated exposure control, adjustment of the mA and/or kV according to patient size and/or use of iterative reconstruction technique. COMPARISON:  MRI 01/30/2022 FINDINGS: Brain: Normal anatomic configuration. No abnormal intra or extra-axial mass lesion or fluid collection. No abnormal mass effect or midline shift. No evidence of acute intracranial hemorrhage or infarct. Ventricular size is normal. Cerebellum unremarkable. Vascular: Unremarkable Skull: Intact Sinuses/Orbits: Air-fluid level within the left maxillary sinus has resolved though mild mucosal thickening persists. Dense opacification of the left frontal sinus and several left ethmoid air cells persists, however. Remaining paranasal sinuses are clear. Orbits are unremarkable. Other: Mastoid air cells and middle ear cavities are clear.  IMPRESSION: 1. No acute intracranial abnormality. No calvarial fracture. 2. Improved left maxillary sinus disease. Persistent left frontal and ethmoid paranasal sinus disease. Electronically Signed   By: Fidela Salisbury M.D.   On: 02/08/2022 19:30      Signature  -   Lala Lund M.D on 02/10/2022 at 10:16 AM   -  To page go to [email or web address]

## 2022-02-10 NOTE — Progress Notes (Signed)
Neurology Progress Note  Brief HPI: 56 year old patient with history of cirrhosis, obesity and aortic valve replacement with bioprosthetic valve presents with expressive aphasia, right-sided tremor and vision abnormalities.  Patient states that several months ago, he began having cramping of his and weakness of his left hand with difficulty making a grip.  He was told that he had a pinched nerve and was originally directed to get EMG but did not follow-up with this.  Around November 16, he noticed tremoring in his right hand with incoordination which has progressively worsened, and he has now developed incoordination of his right leg.  He states that his vision is gone blurry and that objects appear to be jumping around.  He has not had any recent illnesses.  He has been having difficulty with word finding as well.  MRI brain is concerning for subtle cortical ribboning on DWI.  Subjective: Patient reports that he is doing about the same as he has been.  LP performed yesterday shows negative meningitis and encephalitis panel, no pleiocytosis as well as elevated protein of 69.  Exam: Vitals:   02/10/22 0914 02/10/22 1107  BP:  128/81  Pulse: 78   Resp: 19   Temp:  (!) 97.5 F (36.4 C)  SpO2: 95%    Gen: In bed, NAD Resp: non-labored breathing, no acute distress  Neuro: Mental Status: Alert and oriented to person, place, time and situation, able to give a history of his current illness.  Able to follow single step and multistep commands.  Speech is halting and nonfluent with some word finding difficulties, but patient is able to name objects after some effort. Cranial Nerves: Pupils equal round and reactive, extraocular movements intact, right visual field cut, facial sensation symmetrical, face symmetrical, hearing intact to speech, phonation normal, shoulder shrug symmetrical, tongue midline Motor: Occasional fine tremors noted in right arm, able to move all extremities with good antigravity  strength Sensory: Intact to light touch throughout DTR: 2+ throughout Coordination: Finger-to-nose with some ataxia on the right Gait: Deferred  Pertinent Labs:    Latest Ref Rng & Units 02/08/2022    6:37 PM 02/08/2022    6:18 PM 01/31/2022    2:20 PM  CBC  WBC 4.0 - 10.5 K/uL  8.2  7.1   Hemoglobin 13.0 - 17.0 g/dL 18.7  17.8  16.6   Hematocrit 39.0 - 52.0 % 55.0  52.7  48.9   Platelets 150 - 400 K/uL  202  199.0        Latest Ref Rng & Units 02/08/2022    6:37 PM 02/08/2022    6:18 PM 01/31/2022    2:20 PM  BMP  Glucose 70 - 99 mg/dL 118  118  108   BUN 6 - 20 mg/dL 11  9  9    Creatinine 0.61 - 1.24 mg/dL 0.80  0.85  0.80   Sodium 135 - 145 mmol/L 138  135  139   Potassium 3.5 - 5.1 mmol/L 3.9  3.9  3.8   Chloride 98 - 111 mmol/L 102  100  104   CO2 22 - 32 mmol/L  23  26   Calcium 8.9 - 10.3 mg/dL  9.2  9.5     Meningitis/encephalitis panel: Negative CSF glucose 76 CSF protein 69 CSF red cell count 390 in tube 1 and tube 4 CSF white cell count 2 in tube 1, 1 and tube 4 Oligoclonal bands pending IgG index pending Lyme PCR pending VDRL CSF pending Protein 14-3-3 and  T-tau pending CSF ACE pending CSF culture pending  TSH 9.391  Imaging Reviewed:  CT head 12/7 no acute abnormality  Brain MRI: Small 14 mm focus suspicious for subarachnoid hemorrhage at right vertex, subtle abnormal cortical diffusion in left hemisphere concerning for cortical ribboning  CT head 12/8 stable appearance of small subarachnoid hemorrhage  Assessment: 56 year old patient with history of cirrhosis, obesity and aortic valve replacement with bioprosthetic valve presented after having left hand weakness possibly from pinched nerve during the summer now presenting with right sided tremor, right hemianopsia, expressive aphasia and ataxia which began on 11/16 and has progressively worsened since then.  MRI brain is concerning for cortical ribboning, raising the question of CJD.  LP has been  performed, and RT-quic is pending.  Meningitis/encephalitis panel is negative, and EEG reveals no seizure activity.  Awaiting results of serum autoimmune encephalitis panel.  Impression: Possible autoimmune process versus CJD causing right-sided tremor, expressive aphasia and ataxia  Recommendations: 1) await RT-quic and other CSF study results 2) as meningitis/encephalitis panel was negative, start Solu-Medrol 1000 mg daily x 5 doses along with PPI  Justin , MSN, AGACNP-BC [facility] See Amion for schedule and pager information 02/10/2022 11:18 AM    Attending Neurohospitalist Addendum Patient seen and examined with APP/Resident. Agree with the history and physical as documented above. Agree with the plan as documented, which I helped formulate. I have edited the note above to reflect my full findings and recommendations. I have independently reviewed the chart, obtained history, review of systems and examined the patient.I have personally reviewed pertinent head/neck/spine imaging (CT/MRI). Please feel free to call with any questions.  -- Su Monks, MD [facility] [phone]  If 7pm- 7am, please page neurology on call as listed in [place].

## 2022-02-10 NOTE — Progress Notes (Signed)
Initial Nutrition Assessment  DOCUMENTATION CODES:   Not applicable  INTERVENTION:   Liberalize pt diet ot regular due to significant weight loss. Encourage good PO intake Multivitamin w/ minerals daily  NUTRITION DIAGNOSIS:   Increased nutrient needs related to chronic illness as evidenced by estimated needs.  GOAL:   Patient will meet greater than or equal to 90% of their needs  MONITOR:   PO intake, Supplement acceptance, Weight trends, Labs  REASON FOR ASSESSMENT:   Consult Assessment of nutrition requirement/status  ASSESSMENT:   56 y.o. male presented to the ED with diplopia, R hemianopsia, ataxia, aphasia, and dyscoordination. Pt evaluate at [facility] on 11/27 and in the ED on 11/28 with similar symptoms. PMH includes CHF, T2DM, and hepatic cirrhosis.Pt admitted with stroke like symptoms.  Neurology conducting multiple test to better assess reason behind worsening of symptoms.   RD working remotely at time of assessment.  No meal intakes recorded with in EMR.  Per EMR, pt has had a 10% weight loss within 5 months, this is clinically significant for time frame. Although, due to chronic illness unable to determine actual weight loss versus fluid loss.   RD to liberalize pt diet due to severe weight loss noted.   Medications reviewed and include: NovoLog SSI, Protonix, IV Solu-Medrol Labs reviewed: Hgb A1c 7.1% (11/16/21), 24 hr CBGs  109-137  NUTRITION - FOCUSED PHYSICAL EXAM:  Deferred to follow-up.  Diet Order:   Diet Order             Diet regular Room service appropriate? Yes with Assist; Fluid consistency: Thin  Diet effective now                   EDUCATION NEEDS:   No education needs have been identified at this time  Skin:  Skin Assessment: Reviewed RN Assessment  Last BM:  12/8  Height:   Ht Readings from Last 1 Encounters:  02/09/22 6' (1.829 m)    Weight:   Wt Readings from Last 1 Encounters:  02/09/22 120 kg    Ideal Body  Weight:  80.9 kg  BMI:  Body mass index is 35.88 kg/m.  Estimated Nutritional Needs:   Kcal:  2200-2400  Protein:  110-130 grams  Fluid:  >/= 2 L    Hermina Barters RD, LDN Clinical Dietitian See [facility] for contact information.

## 2022-02-10 NOTE — Plan of Care (Signed)
Problem: Education: Goal: Ability to describe self-care measures that may prevent or decrease complications (Diabetes Survival Skills Education) will improve Outcome: Progressing Goal: Individualized Educational Video(s) Outcome: Progressing   Problem: Coping: Goal: Ability to adjust to condition or change in health will improve Outcome: Progressing   Problem: Fluid Volume: Goal: Ability to maintain a balanced intake and output will improve Outcome: Progressing   Problem: Health Behavior/Discharge Planning: Goal: Ability to identify and utilize available resources and services will improve Outcome: Progressing Goal: Ability to manage health-related needs will improve Outcome: Progressing   Problem: Metabolic: Goal: Ability to maintain appropriate glucose levels will improve Outcome: Progressing   Problem: Nutritional: Goal: Maintenance of adequate nutrition will improve Outcome: Progressing Goal: Progress toward achieving an optimal weight will improve Outcome: Progressing   Problem: Skin Integrity: Goal: Risk for impaired skin integrity will decrease Outcome: Progressing   Problem: Tissue Perfusion: Goal: Adequacy of tissue perfusion will improve Outcome: Progressing   Problem: Education: Goal: Knowledge of disease or condition will improve Outcome: Progressing Goal: Knowledge of secondary prevention will improve (MUST DOCUMENT ALL) Outcome: Progressing Goal: Knowledge of patient specific risk factors will improve [place] N/A or DELETE if not current risk factor) Outcome: Progressing   Problem: Ischemic Stroke/TIA Tissue Perfusion: Goal: Complications of ischemic stroke/TIA will be minimized Outcome: Progressing   Problem: Coping: Goal: Will verbalize positive feelings about self Outcome: Progressing Goal: Will identify appropriate support needs Outcome: Progressing   Problem: Health Behavior/Discharge Planning: Goal: Ability to manage health-related needs  will improve Outcome: Progressing Goal: Goals will be collaboratively established with patient/family Outcome: Progressing   Problem: Self-Care: Goal: Ability to participate in self-care as condition permits will improve Outcome: Progressing Goal: Verbalization of feelings and concerns over difficulty with self-care will improve Outcome: Progressing Goal: Ability to communicate needs accurately will improve Outcome: Progressing   Problem: Nutrition: Goal: Risk of aspiration will decrease Outcome: Progressing Goal: Dietary intake will improve Outcome: Progressing   Problem: Education: Goal: Knowledge of General Education information will improve Description: Including pain rating scale, medication(s)/side effects and non-pharmacologic comfort measures Outcome: Progressing   Problem: Health Behavior/Discharge Planning: Goal: Ability to manage health-related needs will improve Outcome: Progressing   Problem: Clinical Measurements: Goal: Ability to maintain clinical measurements within normal limits will improve Outcome: Progressing Goal: Will remain free from infection Outcome: Progressing Goal: Diagnostic test results will improve Outcome: Progressing Goal: Respiratory complications will improve Outcome: Progressing Goal: Cardiovascular complication will be avoided Outcome: Progressing   Problem: Activity: Goal: Risk for activity intolerance will decrease Outcome: Progressing   Problem: Nutrition: Goal: Adequate nutrition will be maintained Outcome: Progressing   Problem: Coping: Goal: Level of anxiety will decrease Outcome: Progressing   Problem: Elimination: Goal: Will not experience complications related to bowel motility Outcome: Progressing Goal: Will not experience complications related to urinary retention Outcome: Progressing   Problem: Pain Managment: Goal: General experience of comfort will improve Outcome: Progressing   Problem: Safety: Goal:  Ability to remain free from injury will improve Outcome: Progressing   Problem: Skin Integrity: Goal: Risk for impaired skin integrity will decrease Outcome: Progressing   Problem: Education: Goal: Knowledge of disease or condition will improve Outcome: Progressing Goal: Knowledge of secondary prevention will improve (MUST DOCUMENT ALL) Outcome: Progressing Goal: Knowledge of patient specific risk factors will improve [place] N/A or DELETE if not current risk factor) Outcome: Progressing   Problem: Ischemic Stroke/TIA Tissue Perfusion: Goal: Complications of ischemic stroke/TIA will be minimized Outcome: Progressing   Problem: Coping: Goal: Will  verbalize positive feelings about self Outcome: Progressing Goal: Will identify appropriate support needs Outcome: Progressing   Problem: Health Behavior/Discharge Planning: Goal: Ability to manage health-related needs will improve Outcome: Progressing Goal: Goals will be collaboratively established with patient/family Outcome: Progressing   Problem: Self-Care: Goal: Ability to participate in self-care as condition permits will improve Outcome: Progressing Goal: Verbalization of feelings and concerns over difficulty with self-care will improve Outcome: Progressing Goal: Ability to communicate needs accurately will improve Outcome: Progressing   Problem: Nutrition: Goal: Risk of aspiration will decrease Outcome: Progressing Goal: Dietary intake will improve Outcome: Progressing

## 2022-02-10 NOTE — Progress Notes (Signed)
Inpatient Rehab Admissions Coordinator:  ? ?Per therapy recommendations,  patient was screened for CIR candidacy by Brenetta Penny, MS, CCC-SLP. At this time, Pt. Appears to be a a potential candidate for CIR. I will place   order for rehab consult per protocol for full assessment. Please contact me any with questions. ? ?Lochlann Mastrangelo, MS, CCC-SLP ?Rehab Admissions Coordinator  ?[phone] (celll) ?[phone] (office) ? ?

## 2022-02-10 NOTE — Evaluation (Signed)
Physical Therapy Evaluation Patient Details Name: Steve Richards MRN: [id number] DOB: November 07, 1965 Today's Date: 02/10/2022  History of Present Illness  56 y.o. male adm 12/7 with PMH significant for hepatic cirrhosis, morbid obesity, aortic valve replacement with bioprosthetic valve, who presentd with word finding difficulty, tremor and vision abnormalities.  MRI: Small 14 mm focus suspicious for subarachnoid hemorrhage at the right vertex adjacent to the falx, "largely unremarkable MRI" Per neuro consult, "Differential includes autoimmune, paraneoplastic etiology vs could be CJD or infectious."  Clinical Impression   Pt admitted secondary to problem above with deficits below. PTA patient was independent and living alone.  Pt currently requires min assist for transfer and mod assist to ambulate with multiple losses of balance. Patient initially denied any balance problems, but after ambulation agreed that he had more problems walking than he thought. Patient highly motivated, can tolerate 3+ hours therapy, and should be able to reach modified independence to allow return home alone.  Anticipate patient will benefit from PT to address problems listed below.Will continue to follow acutely to maximize functional mobility independence and safety.          Recommendations for follow up therapy are one component of a multi-disciplinary discharge planning process, led by the attending physician.  Recommendations may be updated based on patient status, additional functional criteria and insurance authorization.  Follow Up Recommendations Acute inpatient rehab (3hours/day)      Assistance Recommended at Discharge Frequent or constant Supervision/Assistance  Patient can return home with the following  A lot of help with walking and/or transfers;Assistance with cooking/housework;Assist for transportation;Help with stairs or ramp for entrance    Equipment Recommendations Rolling walker (2 wheels)   Recommendations for Other Services       Functional Status Assessment Patient has had a recent decline in their functional status and demonstrates the ability to make significant improvements in function in a reasonable and predictable amount of time.     Precautions / Restrictions Precautions Precautions: Fall Precaution Comments: pt denies falls Restrictions Weight Bearing Restrictions: No      Mobility  Bed Mobility                    Transfers Overall transfer level: Needs assistance Equipment used: None Transfers: Sit to/from Stand Sit to Stand: Min assist           General transfer comment: wide BOS; unsteady on initial stadning requiring min assist    Ambulation/Gait Ambulation/Gait assistance: Mod assist Gait Distance (Feet): 160 Feet Assistive device: None Gait Pattern/deviations: Step-through pattern, Decreased stride length, Shuffle, Ataxic, Staggering left, Wide base of support   Gait velocity interpretation: 1.31 - 2.62 ft/sec, indicative of limited community ambulator   General Gait Details: requires cues due to decr vision on rt; staggering LOB to his left x 2 requiring mod assist to recover; may do better with RW  Stairs            Wheelchair Mobility    Modified Rankin (Stroke Patients Only)       Balance Overall balance assessment: Needs assistance Sitting-balance support: Feet supported Sitting balance-Leahy Scale: Fair     Standing balance support: Single extremity supported Standing balance-Leahy Scale: Poor                               Pertinent Vitals/Pain Pain Assessment Pain Assessment: No/denies pain    Home Living Family/patient expects to be discharged to:: Private  residence Living Arrangements: Alone Available Help at Discharge: Available PRN/intermittently;Friend(s) Type of Home: Apartment Home Access: Stairs to enter Entrance Stairs-Rails: Chemical engineer of Steps: 3 in  the front with no rails and 7 in the back with bilateral rails   Home Layout: One level Home Equipment: None      Prior Function Prior Level of Function : Independent/Modified Independent;Working/employed;Driving             Mobility Comments: works 2 jobs Special educational needs teacher, Banker)       Journalist, newspaper   Dominant Hand: Right    Extremity/Trunk Assessment   Upper Extremity Assessment Upper Extremity Assessment: Defer to OT evaluation RUE Deficits / Details: tremors more to his hands, strength is functional. RUE Sensation: WNL RUE Coordination: decreased fine motor LUE Deficits / Details: tremors more to his hands, strength is functional. LUE Sensation: WNL LUE Coordination: decreased fine motor    Lower Extremity Assessment Lower Extremity Assessment: RLE deficits/detail;LLE deficits/detail RLE Deficits / Details: strength 5/5 throughout; ataxic movements RLE worse than LLE RLE Coordination: decreased fine motor;decreased gross motor (decr RAM; heel to shin) LLE Deficits / Details: strength 5/5 throughout; ataxic movements RLE worse than LLE LLE Coordination: decreased fine motor;decreased gross motor (decr RAM; heel to shin)    Cervical / Trunk Assessment Cervical / Trunk Assessment: Normal  Communication   Communication: Expressive difficulties  Cognition Arousal/Alertness: Awake/alert Behavior During Therapy: WFL for tasks assessed/performed Overall Cognitive Status: Impaired/Different from baseline Area of Impairment: Problem solving, Safety/judgement, Awareness                         Safety/Judgement: Decreased awareness of safety, Decreased awareness of deficits Awareness: Intellectual Problem Solving: Slow processing, Difficulty sequencing General Comments: pt reported no issues with his balance prior to exam (significant decr balance noted with gait)        General Comments      Exercises     Assessment/Plan    PT Assessment  Patient needs continued PT services  PT Problem List Decreased balance;Decreased mobility;Decreased coordination;Decreased knowledge of use of DME;Decreased safety awareness       PT Treatment Interventions DME instruction;Gait training;Stair training;Functional mobility training;Therapeutic activities;Therapeutic exercise;Balance training;Neuromuscular re-education;Patient/family education;Cognitive remediation    PT Goals (Current goals can be found in the Care Plan section)  Acute Rehab PT Goals Patient Stated Goal: find out what is causing problems PT Goal Formulation: With patient Time For Goal Achievement: 03/04/2022 Potential to Achieve Goals: Good    Frequency Min 3X/week     Co-evaluation               AM-PAC PT "6 Clicks" Mobility  Outcome Measure Help needed turning from your back to your side while in a flat bed without using bedrails?: None Help needed moving from lying on your back to sitting on the side of a flat bed without using bedrails?: None Help needed moving to and from a bed to a chair (including a wheelchair)?: A Little Help needed standing up from a chair using your arms (e.g., wheelchair or bedside chair)?: A Little Help needed to walk in hospital room?: A Lot Help needed climbing 3-5 steps with a railing? : Total 6 Click Score: 17    End of Session Equipment Utilized During Treatment: Gait belt Activity Tolerance: Patient tolerated treatment well Patient left: in chair;with call bell/phone within reach;with chair alarm set Nurse Communication: Mobility status;Other (comment) (recommend gait belt and RW) PT Visit Diagnosis:  Unsteadiness on feet (R26.81);Other abnormalities of gait and mobility (R26.89);Ataxic gait (R26.0)    Time: 8309-4076 PT Time Calculation (min) (ACUTE ONLY): 22 min   Charges:   PT Evaluation $PT Eval Low Complexity: [place], PT Acute Rehabilitation Services  Office [phone]   Rexanne Mano 02/10/2022, 2:25 PM

## 2022-02-10 NOTE — Evaluation (Signed)
Occupational Therapy Evaluation Patient Details Name: Steve Richards MRN: [id number] DOB: 05/25/1965 Today's Date: 02/10/2022   History of Present Illness 56 y.o. male adm 12/7 with PMH significant for hepatic cirrhosis, morbid obesity, aortic valve replacement with bioprosthetic valve, who presentd with word finding difficulty, tremor and vision abnormalities.  EXB:MWUXLKG unremarkable MRI.   Clinical Impression   Patient admitted for the diagnosis above.  PTA he lives alone in a apartment, works two jobs, and has not needed assist with ADL,iADL, or mobility.  Over the past month, he has experienced changes to his vision, bilateral upper extremity tremors, and asphasia beginning 11/16.  Deficits impacting independence are listed below.  Currently he is needing up to Mod A for lower body ADL, and Min A for basic mobility in his room.  Acute indicated to maximize his functional status, and help progress him to the next level of care.  AIR is recommended for post acute rehab prior to returning home.  Patient can tolerate 3+ hours of rehab per day, and should progress to at least a Mod I level.         Recommendations for follow up therapy are one component of a multi-disciplinary discharge planning process, led by the attending physician.  Recommendations may be updated based on patient status, additional functional criteria and insurance authorization.   Follow Up Recommendations  Acute inpatient rehab (3hours/day)     Assistance Recommended at Discharge Frequent or constant Supervision/Assistance  Patient can return home with the following Assist for transportation;Assistance with cooking/housework;Help with stairs or ramp for entrance;A lot of help with walking and/or transfers;A lot of help with bathing/dressing/bathroom    Functional Status Assessment  Patient has had a recent decline in their functional status and demonstrates the ability to make significant improvements in function in a  reasonable and predictable amount of time.  Equipment Recommendations  Tub/shower bench    Recommendations for Other Services       Precautions / Restrictions Precautions Precautions: Fall Restrictions Weight Bearing Restrictions: No      Mobility Bed Mobility Overal bed mobility: Needs Assistance Bed Mobility: Supine to Sit     Supine to sit: Supervision          Transfers Overall transfer level: Needs assistance   Transfers: Sit to/from Stand, Bed to chair/wheelchair/BSC Sit to Stand: Min assist     Step pivot transfers: Min assist     General transfer comment: slow shuffling steps due to visual impairment.  Reaching for objects in his environment.  Blance forward on his toes      Balance Overall balance assessment: Needs assistance Sitting-balance support: Feet supported Sitting balance-Leahy Scale: Fair     Standing balance support: Single extremity supported Standing balance-Leahy Scale: Poor                             ADL either performed or assessed with clinical judgement   ADL Overall ADL's : Needs assistance/impaired Eating/Feeding: Set up;Sitting   Grooming: Wash/dry hands;Min guard;Standing   Upper Body Bathing: Set up;Sitting   Lower Body Bathing: Moderate assistance;Sit to/from stand   Upper Body Dressing : Set up;Sitting   Lower Body Dressing: Moderate assistance;Sit to/from stand   Toilet Transfer: Minimal Teacher, English as a foreign language;Ambulation   Toileting- Clothing Manipulation and Hygiene: Supervision/safety;Sitting/lateral lean               Vision Baseline Vision/History: 1 Wears glasses Ability to See in Adequate Light: 2 Moderately  impaired Patient Visual Report: Blurring of vision Vision Assessment?: Yes Visual Fields: Right homonymous hemianopsia Depth Perception: Overshoots;Undershoots     Perception     Praxis      Pertinent Vitals/Pain Pain Assessment Pain Assessment: No/denies pain      Hand Dominance Right   Extremity/Trunk Assessment Upper Extremity Assessment Upper Extremity Assessment: RUE deficits/detail;LUE deficits/detail RUE Deficits / Details: tremors more to his hands, strength is functional. RUE Sensation: WNL RUE Coordination: decreased fine motor LUE Deficits / Details: tremors more to his hands, strength is functional. LUE Sensation: WNL LUE Coordination: decreased fine motor   Lower Extremity Assessment Lower Extremity Assessment: Defer to PT evaluation   Cervical / Trunk Assessment Cervical / Trunk Assessment: Normal   Communication Communication Communication: Expressive difficulties   Cognition Arousal/Alertness: Awake/alert Behavior During Therapy: WFL for tasks assessed/performed Overall Cognitive Status: Impaired/Different from baseline Area of Impairment: Problem solving                             Problem Solving: Slow processing, Difficulty sequencing       General Comments   VSS on RA    Exercises     Shoulder Instructions      Home Living Family/patient expects to be discharged to:: Private residence Living Arrangements: Alone Available Help at Discharge: Available PRN/intermittently;Friend(s) Type of Home: Apartment Home Access: Stairs to enter [employer] of Steps: 3 in the front with no rails and 7 in the back with bilateral rails Entrance Stairs-Rails: Left;Right Home Layout: One level     Bathroom Shower/Tub: Teacher, early years/pre: Standard Bathroom Accessibility: Yes How Accessible: Accessible via walker Home Equipment: None      Lives With: Alone    Prior Functioning/Environment Prior Level of Function : Independent/Modified Independent;Working/employed;Driving                        OT Problem List: Impaired balance (sitting and/or standing);Impaired vision/perception;Decreased knowledge of use of DME or AE      OT Treatment/Interventions:  Self-care/ADL training;Therapeutic activities;Visual/perceptual remediation/compensation;Patient/family education;Balance training    OT Goals(Current goals can be found in the care plan section) Acute Rehab OT Goals Patient Stated Goal: Return home OT Goal Formulation: With patient Time For Goal Achievement: 02/23/22 Potential to Achieve Goals: Good ADL Goals Pt Will Perform Grooming: with supervision;standing Pt Will Perform Lower Body Dressing: with supervision;sit to/from stand Pt Will Transfer to Toilet: with supervision;ambulating;regular height toilet  OT Frequency: Min 2X/week    Co-evaluation              AM-PAC OT "6 Clicks" Daily Activity     Outcome Measure Help from another person eating meals?: A Little Help from another person taking care of personal grooming?: A Little Help from another person toileting, which includes using toliet, bedpan, or urinal?: A Little Help from another person bathing (including washing, rinsing, drying)?: A Lot Help from another person to put on and taking off regular upper body clothing?: A Little Help from another person to put on and taking off regular lower body clothing?: A Lot 6 Click Score: 16   End of Session Equipment Utilized During Treatment: Gait belt Nurse Communication: Mobility status  Activity Tolerance: Patient tolerated treatment well Patient left: in chair;with call bell/phone within reach;with chair alarm set  OT Visit Diagnosis: Unsteadiness on feet (R26.81);Ataxia, unspecified (R27.0);Low vision, both eyes (H54.2)  Time: 1207-1232 OT Time Calculation (min): 25 min Charges:  OT General Charges $OT Visit: 1 Visit OT Evaluation $OT Eval Moderate Complexity: 1 Mod OT Treatments $Self Care/Home Management : 8-22 mins  02/10/2022  RP, OTR/L  Acute Rehabilitation Services  Office:  [place] 02/10/2022, 12:57 PM

## 2022-02-11 DIAGNOSIS — R270 Ataxia, unspecified: Secondary | ICD-10-CM | POA: Diagnosis not present

## 2022-02-11 DIAGNOSIS — H5347 Heteronymous bilateral field defects: Secondary | ICD-10-CM | POA: Diagnosis not present

## 2022-02-11 LAB — COMPREHENSIVE METABOLIC PANEL
ALT: 32 U/L (ref 0–44)
AST: 40 U/L (ref 15–41)
Albumin: 3.9 g/dL (ref 3.5–5.0)
Alkaline Phosphatase: 51 U/L (ref 38–126)
Anion gap: 10 (ref 5–15)
BUN: 10 mg/dL (ref 6–20)
CO2: 22 mmol/L (ref 22–32)
Calcium: 9.3 mg/dL (ref 8.9–10.3)
Chloride: 104 mmol/L (ref 98–111)
Creatinine, Ser: 0.91 mg/dL (ref 0.61–1.24)
GFR, Estimated: >60 mL/min (ref >60)
Glucose, Bld: 231 mg/dL — ABNORMAL HIGH (ref 70–99)
Potassium: 4.3 mmol/L (ref 3.5–5.1)
Sodium: 136 mmol/L (ref 135–145)
Total Bilirubin: 0.9 mg/dL (ref 0.3–1.2)
Total Protein: 6.9 g/dL (ref 6.5–8.1)

## 2022-02-11 LAB — GLUCOSE, CAPILLARY
Glucose-Capillary: 198 mg/dL — ABNORMAL HIGH (ref 70–99)
Glucose-Capillary: 157 mg/dL — ABNORMAL HIGH (ref 70–99)
Glucose-Capillary: 189 mg/dL — ABNORMAL HIGH (ref 70–99)
Glucose-Capillary: 325 mg/dL — ABNORMAL HIGH (ref 70–99)

## 2022-02-11 LAB — CBC WITH DIFFERENTIAL/PLATELET
Abs Immature Granulocytes: 0.02 10*3/uL (ref 0.00–0.07)
Basophils Absolute: 0 10*3/uL (ref 0.0–0.1)
Basophils Relative: 0 %
Eosinophils Absolute: 0 10*3/uL (ref 0.0–0.5)
Eosinophils Relative: 0 %
HCT: 50 % (ref 39.0–52.0)
Hemoglobin: 17 g/dL (ref 13.0–17.0)
Immature Granulocytes: 0 %
Lymphocytes Relative: 6 %
Lymphs Abs: 0.4 10*3/uL — ABNORMAL LOW (ref 0.7–4.0)
MCH: 29.9 pg (ref 26.0–34.0)
MCHC: 34 g/dL (ref 30.0–36.0)
MCV: 88 fL (ref 80.0–100.0)
Monocytes Absolute: 0 10*3/uL — ABNORMAL LOW (ref 0.1–1.0)
Monocytes Relative: 1 %
Neutro Abs: 5.7 10*3/uL (ref 1.7–7.7)
Neutrophils Relative %: 93 %
Platelets: 176 10*3/uL (ref 150–400)
RBC: 5.68 MIL/uL (ref 4.22–5.81)
RDW: 12.9 % (ref 11.5–15.5)
WBC: 6.2 10*3/uL (ref 4.0–10.5)
nRBC: 0 % (ref 0.0–0.2)

## 2022-02-11 LAB — BRAIN NATRIURETIC PEPTIDE: B Natriuretic Peptide: 52.9 pg/mL (ref 0.0–100.0)

## 2022-02-11 LAB — MAGNESIUM: Magnesium: 2 mg/dL (ref 1.7–2.4)

## 2022-02-11 LAB — C-REACTIVE PROTEIN: CRP: 0.5 mg/dL (ref <1.0)

## 2022-02-11 NOTE — Progress Notes (Shared)
Neurology Progress Note  Brief HPI: 56 year old patient with history of cirrhosis, obesity and aortic valve replacement with bioprosthetic valve presents with expressive aphasia, right-sided tremor and vision abnormalities.  Patient states that several months ago, he began having cramping of his and weakness of his left hand with difficulty making a grip.  He was told that he had a pinched nerve and was originally directed to get EMG but did not follow-up with this.  Around November 16, he noticed tremoring in his right hand with incoordination which has progressively worsened, and he has now developed incoordination of his right leg.  He states that his vision is gone blurry and that objects appear to be jumping around.  He has not had any recent illnesses.  He has been having difficulty with word finding as well.  MRI brain is concerning for subtle cortical ribboning on DWI.  Subjective: Patient reports that he is doing worse than yesterday, with new coarse tremor in the left hand.  His speech is more fluent today.  Exam: Vitals:   02/11/22 0400 02/11/22 0848  BP: 106/69 (!) 159/100  Pulse:    Resp: 10 18  Temp:    SpO2:     Gen: In bed, NAD Resp: non-labored breathing, no acute distress  Neuro: Mental Status: Alert and oriented to person, place, time and situation, able to give a history of his current illness.  Able to follow single step and multistep commands.  Speech is halting and nonfluent with some word finding difficulties, but improved from yesterday Cranial Nerves: Pupils equal round and reactive, extraocular movements intact, right visual field cut, facial sensation symmetrical, face symmetrical, hearing intact to speech, phonation normal, shoulder shrug symmetrical, tongue midline Motor: Frequent coarse tremors noted in right and left arm, able to move all extremities with good antigravity strength Sensory: Intact to light touch throughout DTR: 2+ throughout Coordination:  Finger-to-nose intact with presence of tremor bilaterally Gait: Deferred  Pertinent Labs:    Latest Ref Rng & Units 02/11/2022    2:10 AM 02/08/2022    6:37 PM 02/08/2022    6:18 PM  CBC  WBC 4.0 - 10.5 K/uL 6.2   8.2   Hemoglobin 13.0 - 17.0 g/dL 17.0  18.7  17.8   Hematocrit 39.0 - 52.0 % 50.0  55.0  52.7   Platelets 150 - 400 K/uL 176   202        Latest Ref Rng & Units 02/11/2022    2:10 AM 02/08/2022    6:37 PM 02/08/2022    6:18 PM  BMP  Glucose 70 - 99 mg/dL 231  118  118   BUN 6 - 20 mg/dL 10  11  9    Creatinine 0.61 - 1.24 mg/dL 0.91  0.80  0.85   Sodium 135 - 145 mmol/L 136  138  135   Potassium 3.5 - 5.1 mmol/L 4.3  3.9  3.9   Chloride 98 - 111 mmol/L 104  102  100   CO2 22 - 32 mmol/L 22   23   Calcium 8.9 - 10.3 mg/dL 9.3   9.2     Meningitis/encephalitis panel: Negative CSF glucose 76 CSF protein 69 CSF red cell count 390 in tube 1 and tube 4 CSF white cell count 2 in tube 1, 1 and tube 4 Oligoclonal bands pending IgG index pending Lyme PCR pending VDRL CSF pending Protein 14-3-3 and T-tau pending CSF ACE pending CSF culture pending Thyroglobulin antibody pending Ds DNA Ab negative  ANA Ab negative Autoimmune encephalitis panel pending TSH 9.391 Beta Isoform pending  Imaging Reviewed:  CT head 12/7 no acute abnormality  Brain MRI: Small 14 mm focus suspicious for subarachnoid hemorrhage at right vertex, subtle abnormal cortical diffusion in left hemisphere concerning for cortical ribboning  CT head 12/8 stable appearance of small subarachnoid hemorrhage  Assessment: 56 year old patient with history of cirrhosis, obesity and aortic valve replacement with bioprosthetic valve presented after having left hand weakness possibly from pinched nerve during the summer now presenting with right sided tremor, right hemianopsia, expressive aphasia and ataxia which began on 11/16 and has progressively worsened since then.  MRI brain is concerning for cortical  ribboning, raising the question of CJD.  LP has been performed, and RT-quic is pending.  Meningitis/encephalitis panel is negative, and EEG reveals no seizure activity.  Awaiting results of serum autoimmune encephalitis panel.  Speech is improved somewhat after one day of steroids, but tremors are now present in left arm as well as right.  Impression: Possible autoimmune process versus CJD causing right-sided tremor, expressive aphasia and ataxia  Recommendations: 1) await RT-quic and other CSF study results 2) continue Solu-Medrol 1000 mg daily x 5 doses along with PPI 3) repeat MRI after steroids completed  [place] , MSN, AGACNP-BC [facility] See Amion for schedule and pager information 02/11/2022 10:58 AM   Attending Neurohospitalist Addendum Patient seen and examined with APP/Resident. Agree with the history and physical as documented above. Agree with the plan as documented, which I helped formulate. I have edited the note above to reflect my full findings and recommendations. I have independently reviewed the chart, obtained history, review of systems and examined the patient.I have personally reviewed pertinent head/neck/spine imaging (CT/MRI). Please feel free to call with any questions.  Seems more confused this evening on steroids but was able to have a meaningful conversation about the differential dx incl CJD and autoimmune encephalitis and the fact that we are evaluating for treatable causes of his condition. Continue steroids for now end date 12/13.  -- Su Monks, MD [facility] [phone]  If 7pm- 7am, please page neurology on call as listed in [place].

## 2022-02-11 NOTE — Care Management (Signed)
  Transition of Care (TOC) Screening Note   Patient Details  Name: Steve Richards Date of Birth: Sep 07, 1965   Transition of Care [facility]) CM/SW Contact:    Carles Collet, RN Phone Number: 02/11/2022, 1:51 PM    Transition of Care Department [facility]) has reviewed patient and is following patient advancement through interdisciplinary progression rounds. If new patient transition needs arise, please place a TOC consult.  CIR to assess.   From home alone, [facility].

## 2022-02-11 NOTE — Progress Notes (Signed)
PROGRESS NOTE                                                                                                                                                                                                             Patient Demographics:    Steve Richards, is a 56 y.o. male, DOB - 05/17/1965, IOX:[id number]  Outpatient Primary MD for the patient is Girtha Rm, NP-C    LOS - 2  Admit date - 02/08/2022    Chief Complaint  Patient presents with   Stroke Symptoms       Brief Narrative (HPI from H&P)    56 y.o. male with PMH significant for hepatic cirrhosis, morbid obesity, aortic valve replacement with bioprosthetic valve, who presents with word finding difficulty, tremor and vision abnormalities.  He has been having some weakness in his left hand for the past few months had some outpatient neurological workup initially few months ago but could not remain compliant with it.  For the last few weeks he has been having some tremor-like activity in his right hand, he has developed right eye visual problem for which she saw an ophthalmologist and was noted to find right-sided hemianopsia, he also has noticed some problems finding words.   Presented to the ER where MRI was concerning for subtle cortical ribboning suspicious for autoimmune, paraneoplastic etiology vs could be CJD or infectious.  He was seen by neurologist and admitted to the hospital.    Subjective:   Patient in bed, appears comfortable, denies any headache, no fever, no chest pain or pressure, no shortness of breath , no abdominal pain. No new focal weakness.   Assessment  & Plan :   L. hand weakness presumably from ? pinched nerve in summer 2023 who presents with expressive aphasia, ataxia and tremor with right worse than left and R hemianopsia that has progressively worsened since 01/18/22.  Note symptoms ongoing since summer 2023, patient not compliant with  outpatient neurological workup, EEG stable, CT head and MRI noted findings suspicious for  autoimmune, paraneoplastic etiology vs could be CJD or infectious.  Seen by neurology underwent LP shows elevated protein, specific testing for CGD etc. ordered by neurology - CSF autoimmune encephalopathy panel, Oligoclonal bands and IgG index, Lyme PCR, VDRL CSF, protein 14-3-3 and T-Tau with reflex to RT-Quic, CSF ACE.  Continue PT  OT and supportive care.  Seen by speech as well.  Continue to monitor closely.   HX of bioprosthetic AVR.  Monitor with supportive care.  Stable prosthesis in aortic valve physiology on last echo few months ago.  Chronic diastolic CHF EF of 68% on echo done few months ago.  Compensated.   Obesity.  BMI of 35.  Follow-up with PCP.  History of cirrhosis of liver.  Question NASH.  Stable INR and LFTs, outpatient GI workup.    Polycythemia.  Likely due to OSA, monitor.  Small stable subarachnoid hemorrhage noted on MRI.  Symptom-free.  Monitor.  Holding aspirin or anticoagulation for now.  Hypothyroidism.  Home dose Synthroid increased, TSH was elevated, check free T4.  DM type II.  SSI.  Lab Results  Component Value Date   HGBA1C 7.1 (H) 11/16/2021   CBG (last 3)  Recent Labs    02/10/22 1501 02/10/22 2135 02/11/22 0842  GLUCAP 132* 227* 198*        Condition - Fair  Family Communication  :  Sharee Pimple Fischer-[phone] on 02/10/22  Code Status :  Full  Consults  :  Neuro  PUD Prophylaxis :    Procedures  :     LP 02/09/22  MRI - 1. Small 14 mm focus suspicious for subarachnoid hemorrhage at the right vertex adjacent to the falx. This was subtle on the earlier CT, not apparent on MRI last month. Etiology and significance is unclear, has there been recent fall? No other acute intracranial hemorrhage is identified, but there is a small number of nonspecific chronic microhemorrhages in both hemispheres. 2. Otherwise a largely unremarkable MRI appearance of the  brain, HOWEVER, suspicion of subtle abnormal cortical diffusion widely scattered in the left hemisphere. Although with no cortical edema or abnormal signal on the remaining sequences. No enhancement. Given the clinical suspicion Creutzfeldt-Jakob Disease cannot be excluded. Differential diagnosis would include autoimmune or paraneoplastic encephalitis       Disposition Plan  :    Status is: Inpatient  DVT Prophylaxis  :  Place and maintain sequential compression device Start: 02/09/22 1757   Lab Results  Component Value Date   PLT 176 02/11/2022    Diet :  Diet Order             Diet regular Room service appropriate? Yes with Assist; Fluid consistency: Thin  Diet effective now                    Inpatient Medications  Scheduled Meds:  insulin aspart  0-15 Units Subcutaneous TID WC   insulin aspart  0-5 Units Subcutaneous QHS   levothyroxine  100 mcg Oral Daily   multivitamin with minerals  1 tablet Oral Daily   pantoprazole  40 mg Oral Daily   Continuous Infusions:  methylPREDNISolone (SOLU-MEDROL) injection Stopped (02/10/22 1743)   PRN Meds:.acetaminophen **OR** acetaminophen (TYLENOL) oral liquid 160 mg/5 mL **OR** acetaminophen, senna-docusate  Antibiotics  :    Anti-infectives (From admission, onward)    None         Objective:   Vitals:   02/11/22 0200 02/11/22 0302 02/11/22 0400 02/11/22 0848  BP:  123/88 106/69 (!) 159/100  Pulse:  70    Resp: 20 17 10 18   Temp:  97.9 F (36.6 C)    TempSrc:  Oral    SpO2:      Weight:      Height:        Wt Readings from Last  3 Encounters:  02/09/22 120 kg  02/08/22 118.4 kg  01/31/22 118.4 kg     Intake/Output Summary (Last 24 hours) at 02/11/2022 0945 Last data filed at 02/10/2022 2200 Gross per 24 hour  Intake 546 ml  Output --  Net 546 ml     Physical Exam  Awake, alert, mild expressive aphasia, right hemianopsia, mild tremor .AT,PERRAL Supple Neck, No JVD,   Symmetrical Chest wall  movement, Good air movement bilaterally, CTAB RRR,No Gallops, Rubs or new Murmurs,  +ve B.Sounds, Abd Soft, No tenderness,   No Cyanosis, Clubbing or edema       Data Review:    Recent Labs  Lab 02/08/22 1818 02/08/22 1837 02/11/22 0210  WBC 8.2  --  6.2  HGB 17.8* 18.7* 17.0  HCT 52.7* 55.0* 50.0  PLT 202  --  176  MCV 89.5  --  88.0  MCH 30.2  --  29.9  MCHC 33.8  --  34.0  RDW 13.2  --  12.9  LYMPHSABS 1.6  --  0.4*  MONOABS 0.7  --  0.0*  EOSABS 0.1  --  0.0  BASOSABS 0.0  --  0.0    Recent Labs  Lab 02/08/22 1818 02/08/22 1837 02/10/22 0616 02/11/22 0210  NA 135 138  --  136  K 3.9 3.9  --  4.3  CL 100 102  --  104  CO2 23  --   --  22  GLUCOSE 118* 118*  --  231*  BUN 9 11  --  10  CREATININE 0.85 0.80  --  0.91  AST 42*  --   --  40  ALT 31  --   --  32  ALKPHOS 49  --   --  51  BILITOT 1.0  --   --  0.9  ALBUMIN 4.1  --   --  3.9  CRP  --   --  <0.5 0.5  INR 1.0  --   --   --   TSH  --   --  9.391*  --   BNP  --   --   --  52.9  MG  --   --   --  2.0  CALCIUM 9.2  --   --  9.3   ----------------------------------------------------------------------------------------------------------------- Recent Labs    02/10/22 0228  CHOL 176  HDL 30*  LDLCALC 107*  TRIG 194*  CHOLHDL 5.9    Lab Results  Component Value Date   HGBA1C 7.1 (H) 11/16/2021    Recent Labs    02/10/22 0616  TSH 9.391*      Radiology Reports CT HEAD WO CONTRAST (5MM)  Result Date: 02/09/2022 CLINICAL DATA:  Follow-up subarachnoid hemorrhage EXAM: CT HEAD WITHOUT CONTRAST TECHNIQUE: Contiguous axial images were obtained from the base of the skull through the vertex without intravenous contrast. RADIATION DOSE REDUCTION: This exam was performed according to the departmental dose-optimization program which includes automated exposure control, adjustment of the mA and/or kV according to patient size and/or use of iterative reconstruction technique. COMPARISON:  CT  02/08/2022 and MR brain 02/09/2022 FINDINGS: Brain: The small focus along the right superior frontal gyrus of suspected subarachnoid hemorrhage appears unchanged from the CT dated 02/08/2022. This measures approximately 1.1 cm on sagittal image 28/6. Previously 1.3 cm. No new areas of hemorrhage identified. No signs of acute stroke. Sulci and ventricular volumes appear normal. Vascular: No hyperdense vessel or unexpected calcification. Skull: Normal. Negative for fracture or focal lesion. Sinuses/Orbits:  Chronic left maxillary and ethmoid mucosal thickening. Mastoid air cells are clear. Other: None. IMPRESSION: 1. Stable appearance of small focus of suspected subarachnoid hemorrhage along the right superior frontal gyrus. 2. No new areas of hemorrhage identified. 3. Chronic left maxillary and ethmoid sinus disease. Electronically Signed   By: Kerby Moors M.D.   On: 02/09/2022 18:42   EEG adult  Result Date: 02/09/2022 Landry Corporal, MD     02/09/2022 11:40 AM [facility] Routine EEG Report Video Performed: Performed Demographics: Patient Name:   Keveon, Amsler Date of Birth:   06/10/1965 Identification Number:   MRN - [id number] Study Times: Study Start Time:   02/09/2022 10:18:53 Study End Time:   02/09/2022 11:09:09 Duration:   50 minutes Indication(s): Spells, Eval for Seizures Technical Summary: This EEG was performed utilizing standard International 10-20 System of electrode placement. Data were obtained and interpreted utilizing referential montage recording, with reformatting to longitudinal, transverse bipolar, and referential montages as necessary for interpretation. State(s):      Drowsy      Asleep Activation Procedures: Hyperventilation: Not performed Photic Stimulation: Not performed EEG Description: Awake background of 6 to 7 Hz is occasionally present. Intermittent generalized theta-delta slowing was present. Excess continuous generalized  theta-delta slowing was present. No normal sleep architecture is noted. Muscle, motion, electrode, and eye movement artifacts were occasionally noted. Prominent salt bridging versus sweat artifact is noted. Impression: Abnormal EEG due to:   1) Theta range background slowing  2) Intermittent generalized theta-delta slowing  3) Continuous generalized theta-delta slowing Clinical Correlation: This EEG is suggestive of a mild-moderate encephalopathy, but is nonspecific as to etiology. The absence of epileptiform abnormalities does not preclude a clinical diagnosis of seizures. [facility] For Inpatient follow-up with [facility] physician please call [facility] [phone]. This is not an outpatient service. Post hospital discharge, please contact hospital directly.   MR BRAIN W WO CONTRAST  Result Date: 02/09/2022 CLINICAL DATA:  56 year old male with progressive neurologic deficits since last month. Aphasia, abnormal gait, right arm tremor, now with right lower extremity tremor and progressive. And progressive right greater than left hemianopsia. Neurology considering autoimmune versus paraneoplastic etiology versus CJD. History of cirrhosis. EXAM: MRI HEAD WITHOUT AND WITH CONTRAST TECHNIQUE: Multiplanar, multiecho pulse sequences of the brain and surrounding structures were obtained without and with intravenous contrast. CONTRAST:  83m GADAVIST GADOBUTROL 1 MMOL/ML IV SOLN COMPARISON:  Head CT yesterday. Brain MRI without and with contrast 01/30/2022. FINDINGS: Brain: Cerebral volume appears stable and within normal limits for age. But along the right superior frontal gyrus there is a seemingly small new nodular roughly 14 mm extra-axial lesion with FLAIR hyperintensity (series 11, image 24), partial intrinsic T1 hyperintensity (series 16, image 49) and questionable enhancement (series 19, images 19 and 20) which is subtle on T2 and diffusion but was not clearly visible on MRI last month. This has a  nonspecific intermediate density appearance on MRI this morning, but demonstrates pronounced susceptibility on SWI (series 14, image 49) and is therefore suspicious for trace subarachnoid hemorrhage. Adjacent superior sagittal sinus seems unremarkable. Punctate chronic microhemorrhage in the left frontal lobe white matter and right parietal lobe on series 14, image 35 are stable from last month but more conspicuous than on the T2* imaging at that time. And there appear to be 2 additional punctate chronic microhemorrhages in the frontal lobe white matter also on image 35. But no other chronic cerebral blood products. On this 1.5 Tesla exam today there is suspicious  asymmetric cortical increased diffusion affecting multiple sites in the left hemisphere, 1 of the most conspicuous areas is the left parietal lobe (series 5, image 87). This might have been present last month, but as on that exam there is no convincing T2 or FLAIR cortex correlation, no convincing gyral edema. Similar subtle asymmetry present on the coronal DWI. No abnormal enhancement identified. No dural thickening. Background gray and white matter signal remains normal otherwise. No restricted diffusion suggestive of acute infarction. No midline shift, mass effect. No ventriculomegaly. No ventricular debris. Cervicomedullary junction and pituitary are within normal limits. No intrinsic T1 hyperintensity in the deep gray nuclei. Vascular: Major intracranial vascular flow voids are stable, preserved. The major dural venous sinuses are enhancing and appear to be patent. Skull and upper cervical spine: Negative. Visualized bone marrow signal is within normal limits. Sinuses/Orbits: Rightward gaze. Normal suprasellar cistern and unremarkable optic chiasm. Chronic appearing left maxillary wall deformity. Left maxillary and ethmoid mucosal thickening. Other: Mastoids remain clear. Visible internal auditory structures appear normal. IMPRESSION: 1. Small 14 mm  focus suspicious for subarachnoid hemorrhage at the right vertex adjacent to the falx. This was subtle on the earlier CT, not apparent on MRI last month. Etiology and significance is unclear, has there been recent fall? No other acute intracranial hemorrhage is identified, but there is a small number of nonspecific chronic microhemorrhages in both hemispheres. 2. Otherwise a largely unremarkable MRI appearance of the brain, HOWEVER, suspicion of subtle abnormal cortical diffusion widely scattered in the left hemisphere. Although with no cortical edema or abnormal signal on the remaining sequences. No enhancement. Given the clinical suspicion Creutzfeldt-Jakob Disease cannot be excluded. Differential diagnosis would include autoimmune or paraneoplastic encephalitis - although absence of T2/FLAIR abnormality argues against those. Study discussed by telephone with Dr. Donnetta Simpers on 02/09/2022 at 06:48. Electronically Signed   By: Genevie Ann M.D.   On: 02/09/2022 06:49   CT HEAD WO CONTRAST  Result Date: 02/08/2022 CLINICAL DATA:  Neuro deficit, acute, stroke suspected. Expressive aphasia, abnormal gait. EXAM: CT HEAD WITHOUT CONTRAST TECHNIQUE: Contiguous axial images were obtained from the base of the skull through the vertex without intravenous contrast. RADIATION DOSE REDUCTION: This exam was performed according to the departmental dose-optimization program which includes automated exposure control, adjustment of the mA and/or kV according to patient size and/or use of iterative reconstruction technique. COMPARISON:  MRI 01/30/2022 FINDINGS: Brain: Normal anatomic configuration. No abnormal intra or extra-axial mass lesion or fluid collection. No abnormal mass effect or midline shift. No evidence of acute intracranial hemorrhage or infarct. Ventricular size is normal. Cerebellum unremarkable. Vascular: Unremarkable Skull: Intact Sinuses/Orbits: Air-fluid level within the left maxillary sinus has resolved  though mild mucosal thickening persists. Dense opacification of the left frontal sinus and several left ethmoid air cells persists, however. Remaining paranasal sinuses are clear. Orbits are unremarkable. Other: Mastoid air cells and middle ear cavities are clear. IMPRESSION: 1. No acute intracranial abnormality. No calvarial fracture. 2. Improved left maxillary sinus disease. Persistent left frontal and ethmoid paranasal sinus disease. Electronically Signed   By: Fidela Salisbury M.D.   On: 02/08/2022 19:30      Signature  -   Lala Lund M.D on 02/11/2022 at 9:45 AM   -  To page go to [email or web address]

## 2022-02-11 NOTE — Progress Notes (Signed)
Mobility Specialist Progress Note:   02/11/22 1146  Mobility  Activity Ambulated with assistance in hallway  Level of Assistance Contact guard assist, steadying assist  Assistive Device Front wheel walker  Distance Ambulated (ft) 400 ft  Activity Response Tolerated well  Mobility Referral Yes  $Mobility charge 1 Mobility   Pt received in chair and agreeable. Required max cues for direction d/t visual impairment. No complaints. Pt left in chair with all needs met and call bell in reach.   Kaitlyn Wall Mobility Specialist Please contact via SecureChat or  Rehab office at [phone]  

## 2022-02-12 DIAGNOSIS — H5347 Heteronymous bilateral field defects: Secondary | ICD-10-CM | POA: Diagnosis not present

## 2022-02-12 LAB — COMPREHENSIVE METABOLIC PANEL
ALT: 32 U/L (ref 0–44)
AST: 36 U/L (ref 15–41)
Albumin: 3.9 g/dL (ref 3.5–5.0)
Alkaline Phosphatase: 49 U/L (ref 38–126)
Anion gap: 11 (ref 5–15)
BUN: 12 mg/dL (ref 6–20)
CO2: 24 mmol/L (ref 22–32)
Calcium: 9.2 mg/dL (ref 8.9–10.3)
Chloride: 105 mmol/L (ref 98–111)
Creatinine, Ser: 0.98 mg/dL (ref 0.61–1.24)
GFR, Estimated: >60 mL/min (ref >60)
Glucose, Bld: 165 mg/dL — ABNORMAL HIGH (ref 70–99)
Potassium: 4.1 mmol/L (ref 3.5–5.1)
Sodium: 140 mmol/L (ref 135–145)
Total Bilirubin: 0.7 mg/dL (ref 0.3–1.2)
Total Protein: 7 g/dL (ref 6.5–8.1)

## 2022-02-12 LAB — CBC WITH DIFFERENTIAL/PLATELET
Abs Immature Granulocytes: 0.06 10*3/uL (ref 0.00–0.07)
Basophils Absolute: 0 10*3/uL (ref 0.0–0.1)
Basophils Relative: 0 %
Eosinophils Absolute: 0 10*3/uL (ref 0.0–0.5)
Eosinophils Relative: 0 %
HCT: 50.8 % (ref 39.0–52.0)
Hemoglobin: 17.2 g/dL — ABNORMAL HIGH (ref 13.0–17.0)
Immature Granulocytes: 0 %
Lymphocytes Relative: 4 %
Lymphs Abs: 0.5 10*3/uL — ABNORMAL LOW (ref 0.7–4.0)
MCH: 30.1 pg (ref 26.0–34.0)
MCHC: 33.9 g/dL (ref 30.0–36.0)
MCV: 89 fL (ref 80.0–100.0)
Monocytes Absolute: 0.2 10*3/uL (ref 0.1–1.0)
Monocytes Relative: 2 %
Neutro Abs: 12.6 10*3/uL — ABNORMAL HIGH (ref 1.7–7.7)
Neutrophils Relative %: 94 %
Platelets: 195 10*3/uL (ref 150–400)
RBC: 5.71 MIL/uL (ref 4.22–5.81)
RDW: 13.2 % (ref 11.5–15.5)
WBC: 13.4 10*3/uL — ABNORMAL HIGH (ref 4.0–10.5)
nRBC: 0 % (ref 0.0–0.2)

## 2022-02-12 LAB — CSF CULTURE W GRAM STAIN: Culture: NO GROWTH

## 2022-02-12 LAB — GLUCOSE, CAPILLARY
Glucose-Capillary: 191 mg/dL — ABNORMAL HIGH (ref 70–99)
Glucose-Capillary: 274 mg/dL — ABNORMAL HIGH (ref 70–99)
Glucose-Capillary: 197 mg/dL — ABNORMAL HIGH (ref 70–99)
Glucose-Capillary: 187 mg/dL — ABNORMAL HIGH (ref 70–99)

## 2022-02-12 LAB — HEMOGLOBIN A1C
Hgb A1c MFr Bld: 6.2 % — ABNORMAL HIGH (ref 4.8–5.6)
Mean Plasma Glucose: 131 mg/dL

## 2022-02-12 LAB — MAGNESIUM: Magnesium: 2.1 mg/dL (ref 1.7–2.4)

## 2022-02-12 LAB — BRAIN NATRIURETIC PEPTIDE: B Natriuretic Peptide: 34.2 pg/mL (ref 0.0–100.0)

## 2022-02-12 LAB — C-REACTIVE PROTEIN: CRP: 0.5 mg/dL (ref <1.0)

## 2022-02-12 MED ORDER — POLYETHYLENE GLYCOL 3350 17 G PO PACK
17.0000 g | PACK | Freq: Two times a day (BID) | ORAL | Status: DC
  Administered 2022-02-12 – 2022-02-22 (×17): 17 g via ORAL
  Filled 2022-02-12 (×18): qty 1

## 2022-02-12 MED ORDER — MAGNESIUM HYDROXIDE 400 MG/5ML PO SUSP
30.0000 mL | Freq: Two times a day (BID) | ORAL | Status: AC
  Administered 2022-02-12 (×2): 30 mL via ORAL
  Filled 2022-02-12 (×2): qty 30

## 2022-02-12 MED ORDER — DOCUSATE SODIUM 100 MG PO CAPS
200.0000 mg | ORAL_CAPSULE | Freq: Two times a day (BID) | ORAL | Status: DC
  Administered 2022-02-12 – 2022-02-22 (×17): 200 mg via ORAL
  Filled 2022-02-12 (×16): qty 2

## 2022-02-12 MED ORDER — LACTATED RINGERS IV SOLN: INTRAVENOUS | Status: AC

## 2022-02-12 NOTE — Plan of Care (Signed)
Problem: Education: Goal: Ability to describe self-care measures that may prevent or decrease complications (Diabetes Survival Skills Education) will improve 02/12/2022 0606 by Arnold Long, RN Outcome: Progressing 02/12/2022 0558 by Arnold Long, RN Outcome: Progressing 02/12/2022 0558 by Arnold Long, RN Outcome: Progressing Goal: Individualized Educational Video(s) 02/12/2022 0606 by Arnold Long, RN Outcome: Progressing 02/12/2022 0558 by Arnold Long, RN Outcome: Progressing 02/12/2022 0558 by Arnold Long, RN Outcome: Progressing   Problem: Coping: Goal: Ability to adjust to condition or change in health will improve 02/12/2022 0606 by Arnold Long, RN Outcome: Progressing 02/12/2022 0558 by Arnold Long, RN Outcome: Progressing 02/12/2022 0558 by Arnold Long, RN Outcome: Progressing   Problem: Fluid Volume: Goal: Ability to maintain a balanced intake and output will improve 02/12/2022 0606 by Arnold Long, RN Outcome: Progressing 02/12/2022 0558 by Arnold Long, RN Outcome: Progressing 02/12/2022 0558 by Arnold Long, RN Outcome: Progressing   Problem: Health Behavior/Discharge Planning: Goal: Ability to identify and utilize available resources and services will improve 02/12/2022 0606 by Arnold Long, RN Outcome: Progressing 02/12/2022 0558 by Arnold Long, RN Outcome: Progressing 02/12/2022 0558 by Arnold Long, RN Outcome: Progressing Goal: Ability to manage health-related needs will improve 02/12/2022 0606 by Arnold Long, RN Outcome: Progressing 02/12/2022 0558 by Arnold Long, RN Outcome: Progressing 02/12/2022 0558 by Arnold Long, RN Outcome: Progressing   Problem: Metabolic: Goal: Ability to maintain appropriate glucose levels will improve 02/12/2022 0606 by Arnold Long, RN Outcome: Progressing 02/12/2022 0558 by Arnold Long, RN Outcome: Progressing 02/12/2022 0558 by Arnold Long, RN Outcome: Progressing   Problem: Nutritional: Goal: Maintenance of adequate nutrition will improve 02/12/2022 0606 by Arnold Long, RN Outcome: Progressing 02/12/2022 0558 by Arnold Long, RN Outcome: Progressing 02/12/2022 0558 by Arnold Long, RN Outcome: Progressing Goal: Progress toward achieving an optimal weight will improve 02/12/2022 0606 by Arnold Long, RN Outcome: Progressing 02/12/2022 0558 by Arnold Long, RN Outcome: Progressing 02/12/2022 0558 by Arnold Long, RN Outcome: Progressing   Problem: Skin Integrity: Goal: Risk for impaired skin integrity will decrease 02/12/2022 0606 by Arnold Long, RN Outcome: Progressing 02/12/2022 0558 by Arnold Long, RN Outcome: Progressing 02/12/2022 0558 by Arnold Long, RN Outcome: Progressing   Problem: Tissue Perfusion: Goal: Adequacy of tissue perfusion will improve 02/12/2022 0606 by Arnold Long, RN Outcome: Progressing 02/12/2022 0558 by Arnold Long, RN Outcome: Progressing 02/12/2022 0558 by Arnold Long, RN Outcome: Progressing   Problem: Education: Goal: Knowledge of disease or condition will improve 02/12/2022 0606 by Arnold Long, RN Outcome: Progressing 02/12/2022 0558 by Arnold Long, RN Outcome: Progressing 02/12/2022 0558 by Arnold Long, RN Outcome: Progressing Goal: Knowledge of secondary prevention will improve (MUST DOCUMENT ALL) 02/12/2022 0606 by Arnold Long, RN Outcome: Progressing 02/12/2022 0558 by Arnold Long, RN Outcome: Progressing 02/12/2022 0558 by Arnold Long, RN Outcome: Progressing Goal: Knowledge of patient specific risk factors will improve Elta [place] N/A or DELETE if not current risk factor) 02/12/2022 0606 by Arnold Long, RN Outcome: Progressing 02/12/2022 0558 by Arnold Long, RN Outcome: Progressing 02/12/2022 0558 by Arnold Long, RN Outcome: Progressing   Problem: Ischemic Stroke/TIA Tissue  Perfusion: Goal: Complications of ischemic stroke/TIA will be minimized 02/12/2022 0606 by Arnold Long, RN Outcome: Progressing 02/12/2022 0558 by Arnold Long, RN Outcome: Progressing 02/12/2022 0558 by Arnold Long, RN Outcome: Progressing   Problem: Coping: Goal: Will verbalize positive feelings about self 02/12/2022 0606 by Arnold Long, RN Outcome: Progressing 02/12/2022 0558 by Arnold Long, RN Outcome: Progressing 02/12/2022 0558 by Arnold Long, RN Outcome: Progressing Goal: Will identify appropriate support needs 02/12/2022 0606  by Arnold Long, RN Outcome: Progressing 02/12/2022 0558 by Arnold Long, RN Outcome: Progressing 02/12/2022 0558 by Arnold Long, RN Outcome: Progressing   Problem: Health Behavior/Discharge Planning: Goal: Ability to manage health-related needs will improve 02/12/2022 0606 by Arnold Long, RN Outcome: Progressing 02/12/2022 0558 by Arnold Long, RN Outcome: Progressing 02/12/2022 0558 by Arnold Long, RN Outcome: Progressing Goal: Goals will be collaboratively established with patient/family 02/12/2022 0606 by Arnold Long, RN Outcome: Progressing 02/12/2022 0558 by Arnold Long, RN Outcome: Progressing 02/12/2022 0558 by Arnold Long, RN Outcome: Progressing   Problem: Self-Care: Goal: Ability to participate in self-care as condition permits will improve 02/12/2022 0606 by Arnold Long, RN Outcome: Progressing 02/12/2022 0558 by Arnold Long, RN Outcome: Progressing 02/12/2022 0558 by Arnold Long, RN Outcome: Progressing Goal: Verbalization of feelings and concerns over difficulty with self-care will improve 02/12/2022 0606 by Arnold Long, RN Outcome: Progressing 02/12/2022 0558 by Arnold Long, RN Outcome: Progressing 02/12/2022 0558 by Arnold Long, RN Outcome: Progressing Goal: Ability to communicate needs accurately will improve 02/12/2022 0606 by Arnold Long, RN Outcome: Progressing 02/12/2022 0558 by Arnold Long, RN Outcome: Progressing 02/12/2022 0558 by Arnold Long, RN Outcome: Progressing   Problem: Nutrition: Goal: Risk of aspiration will decrease 02/12/2022 0606 by Arnold Long, RN Outcome: Progressing 02/12/2022 0558 by Arnold Long, RN Outcome: Progressing 02/12/2022 0558 by Arnold Long, RN Outcome: Progressing Goal: Dietary intake will improve 02/12/2022 0606 by Arnold Long, RN Outcome: Progressing 02/12/2022 0558 by Arnold Long, RN Outcome: Progressing 02/12/2022 0558 by Arnold Long, RN Outcome: Progressing   Problem: Education: Goal: Knowledge of General Education information will improve Description: Including pain rating scale, medication(s)/side effects and non-pharmacologic comfort measures 02/12/2022 0606 by Arnold Long, RN Outcome: Progressing 02/12/2022 0558 by Arnold Long, RN Outcome: Progressing 02/12/2022 0558 by Arnold Long, RN Outcome: Progressing   Problem: Health Behavior/Discharge Planning: Goal: Ability to manage health-related needs will improve 02/12/2022 0606 by Arnold Long, RN Outcome: Progressing 02/12/2022 0558 by Arnold Long, RN Outcome: Progressing 02/12/2022 0558 by Arnold Long, RN Outcome: Progressing   Problem: Clinical Measurements: Goal: Ability to maintain clinical measurements within normal limits will improve 02/12/2022 0606 by Arnold Long, RN Outcome: Progressing 02/12/2022 0558 by Arnold Long, RN Outcome: Progressing 02/12/2022 0558 by Arnold Long, RN Outcome: Progressing Goal: Will remain free from infection 02/12/2022 0606 by Arnold Long, RN Outcome: Progressing 02/12/2022 0558 by Arnold Long, RN Outcome: Progressing 02/12/2022 0558 by Arnold Long, RN Outcome: Progressing Goal: Diagnostic test results will improve 02/12/2022 0606 by Arnold Long, RN Outcome:  Progressing 02/12/2022 0558 by Arnold Long, RN Outcome: Progressing 02/12/2022 0558 by Arnold Long, RN Outcome: Progressing Goal: Respiratory complications will improve 02/12/2022 0606 by Arnold Long, RN Outcome: Progressing 02/12/2022 0558 by Arnold Long, RN Outcome: Progressing 02/12/2022 0558 by Arnold Long, RN Outcome: Progressing Goal: Cardiovascular complication will be avoided 02/12/2022 0606 by Arnold Long, RN Outcome: Progressing 02/12/2022 0558 by Arnold Long, RN Outcome: Progressing 02/12/2022 0558 by Arnold Long, RN Outcome: Progressing   Problem: Activity: Goal: Risk for activity intolerance will decrease 02/12/2022 0606 by Arnold Long, RN Outcome: Progressing 02/12/2022 0558 by Arnold Long, RN Outcome: Progressing 02/12/2022 0558 by Arnold Long, RN Outcome: Progressing   Problem: Nutrition: Goal: Adequate nutrition will be maintained 02/12/2022 0606 by Arnold Long, RN Outcome: Progressing 02/12/2022 0558 by Arnold Long, RN Outcome: Progressing 02/12/2022 0558 by Arnold Long, RN Outcome: Progressing   Problem: Coping: Goal: Level of anxiety will decrease 02/12/2022 0606 by Arnold Long, RN Outcome: Progressing 02/12/2022 0558 by Arnold Long,  RN Outcome: Progressing 02/12/2022 0558 by Arnold Long, RN Outcome: Progressing   Problem: Elimination: Goal: Will not experience complications related to bowel motility 02/12/2022 0606 by Arnold Long, RN Outcome: Progressing 02/12/2022 0558 by Arnold Long, RN Outcome: Progressing 02/12/2022 0558 by Arnold Long, RN Outcome: Progressing Goal: Will not experience complications related to urinary retention 02/12/2022 0606 by Arnold Long, RN Outcome: Progressing 02/12/2022 0558 by Arnold Long, RN Outcome: Progressing 02/12/2022 0558 by Arnold Long, RN Outcome: Progressing   Problem: Education: Goal: Knowledge of  disease or condition will improve 02/12/2022 0606 by Arnold Long, RN Outcome: Progressing 02/12/2022 0558 by Arnold Long, RN Outcome: Progressing 02/12/2022 0558 by Arnold Long, RN Outcome: Progressing Goal: Knowledge of secondary prevention will improve (MUST DOCUMENT ALL) 02/12/2022 0606 by Arnold Long, RN Outcome: Progressing 02/12/2022 0558 by Arnold Long, RN Outcome: Progressing 02/12/2022 0558 by Arnold Long, RN Outcome: Progressing Goal: Knowledge of patient specific risk factors will improve Elta [place] N/A or DELETE if not current risk factor) 02/12/2022 0606 by Arnold Long, RN Outcome: Progressing 02/12/2022 0558 by Arnold Long, RN Outcome: Progressing 02/12/2022 0558 by Arnold Long, RN Outcome: Progressing   Problem: Ischemic Stroke/TIA Tissue Perfusion: Goal: Complications of ischemic stroke/TIA will be minimized 02/12/2022 0606 by Arnold Long, RN Outcome: Progressing 02/12/2022 0558 by Arnold Long, RN Outcome: Progressing 02/12/2022 0558 by Arnold Long, RN Outcome: Progressing   Problem: Coping: Goal: Will verbalize positive feelings about self 02/12/2022 0606 by Arnold Long, RN Outcome: Progressing 02/12/2022 0558 by Arnold Long, RN Outcome: Progressing 02/12/2022 0558 by Arnold Long, RN Outcome: Progressing Goal: Will identify appropriate support needs 02/12/2022 0606 by Arnold Long, RN Outcome: Progressing 02/12/2022 0558 by Arnold Long, RN Outcome: Progressing 02/12/2022 0558 by Arnold Long, RN Outcome: Progressing   Problem: Health Behavior/Discharge Planning: Goal: Ability to manage health-related needs will improve 02/12/2022 0606 by Arnold Long, RN Outcome: Progressing 02/12/2022 0558 by Arnold Long, RN Outcome: Progressing 02/12/2022 0558 by Arnold Long, RN Outcome: Progressing Goal: Goals will be collaboratively established with patient/family 02/12/2022 0606 by  Arnold Long, RN Outcome: Progressing 02/12/2022 0558 by Arnold Long, RN Outcome: Progressing 02/12/2022 0558 by Arnold Long, RN Outcome: Progressing   Problem: Self-Care: Goal: Ability to participate in self-care as condition permits will improve 02/12/2022 0606 by Arnold Long, RN Outcome: Progressing 02/12/2022 0558 by Arnold Long, RN Outcome: Progressing 02/12/2022 0558 by Arnold Long, RN Outcome: Progressing Goal: Verbalization of feelings and concerns over difficulty with self-care will improve 02/12/2022 0606 by Arnold Long, RN Outcome: Progressing 02/12/2022 0558 by Arnold Long, RN Outcome: Progressing 02/12/2022 0558 by Arnold Long, RN Outcome: Progressing Goal: Ability to communicate needs accurately will improve 02/12/2022 0606 by Arnold Long, RN Outcome: Progressing 02/12/2022 0558 by Arnold Long, RN Outcome: Progressing 02/12/2022 0558 by Arnold Long, RN Outcome: Progressing   Problem: Nutrition: Goal: Risk of aspiration will decrease 02/12/2022 0606 by Arnold Long, RN Outcome: Progressing 02/12/2022 0558 by Arnold Long, RN Outcome: Progressing 02/12/2022 0558 by Arnold Long, RN Outcome: Progressing Goal: Dietary intake will improve 02/12/2022 0606 by Arnold Long, RN Outcome: Progressing 02/12/2022 0558 by Arnold Long, RN Outcome: Progressing 02/12/2022 0558 by Arnold Long, RN Outcome: Progressing

## 2022-02-12 NOTE — Progress Notes (Signed)
Mobility Specialist Progress Note:   02/12/22 1000  Mobility  Activity Ambulated with assistance in hallway  Level of Assistance Minimal assist, patient does 75% or more  Assistive Device Front wheel walker  Distance Ambulated (ft) 300 ft  Activity Response Tolerated well  Mobility Referral Yes  $Mobility charge 1 Mobility   Pt eager for mobility session, however tearful at beginning of session d/t decreased vision this am (pt states he was seeing black out of L eye, now can see colors and thinks it's "coming back"). Required up to minA to steady during ambulation, max directional cues. Pt back in chair with all needs met.   Nelta Numbers Mobility Specialist Please contact via SecureChat or  Rehab office at [phone]

## 2022-02-12 NOTE — NC FL2 (Signed)
[place] LEVEL OF CARE FORM     IDENTIFICATION  Patient Name: Steve Richards Birthdate: 1965-11-30 Sex: male Admission Date (Current Location): 02/08/2022  [facility] and [place] Number:  Herbalist and Address:  [facility], [place] [address] [place], [place] [address]      Provider Number: [id number]  Attending Physician Name and Address:  Thurnell Lose, MD  Relative Name and Phone Number:       Current Level of Care: Hospital Recommended Level of Care: [place] Prior Approval Number:    Date Approved/Denied:   PASRR Number: pending  Discharge Plan: SNF    Current Diagnoses: Patient Active Problem List   Diagnosis Date Noted   Hemianopsia 02/09/2022   Stroke-like symptoms 02/09/2022   SAH (subarachnoid hemorrhage) ([place]) 02/09/2022   Controlled type 2 diabetes mellitus with complication, without long-term current use of insulin ([place]) 11/21/2021   Refusal of statin medication by patient 11/21/2021   Vitamin D deficiency 11/20/2021   Elevated hemoglobin A1c 11/20/2021   Hypertriglyceridemia 11/20/2021   Hyperlipidemia associated with type 2 diabetes mellitus ([place]) 11/20/2021   Hypothyroidism 12/10/2019   S/P AVR 08/07/2019   S/P aortic valve replacement with bioprosthetic valve 08/06/2019   Aortic valve stenosis 08/06/2019   Severe aortic stenosis    New onset of congestive heart failure ([place]) 07/21/2019   Cellulitis and abscess of left lower extremity 07/21/2019   Hyperglycemia 07/21/2019   Class 2 obesity due to excess calories with body mass index (BMI) of 35.0 to 35.9 in adult 07/21/2019   Ascites 07/15/2019   Hepatic cirrhosis ([place]) 07/15/2019   Pleural effusion on right 07/15/2019   Left kidney mass 07/15/2019   Splenomegaly 17/51/0258   Systolic ejection murmur 52/77/8242    Orientation RESPIRATION BLADDER Height & Weight     Self, Time, Situation, Place  Normal Continent Weight: 264 lb 8.8  oz (120 kg) Height:  6' (182.9 cm)  BEHAVIORAL SYMPTOMS/MOOD NEUROLOGICAL BOWEL NUTRITION STATUS      Continent Diet (See dc summary)  AMBULATORY STATUS COMMUNICATION OF NEEDS Skin   Supervision Verbally Normal                       Personal Care Assistance Level of Assistance  Bathing, Feeding, Dressing Bathing Assistance: Limited assistance Feeding assistance: Independent Dressing Assistance: Limited assistance     Functional Limitations Info  Sight Sight Info: Impaired        SPECIAL CARE FACTORS FREQUENCY  PT (By licensed PT), OT (By licensed OT)     PT Frequency: 5x/week OT Frequency: 5x/week            Contractures Contractures Info: Not present    Additional Factors Info  Code Status, Allergies, Insulin Sliding Scale Code Status Info: Full Allergies Info: NKA   Insulin Sliding Scale Info: See dc summary       Current Medications (02/12/2022):  This is the current hospital active medication list Current Facility-Administered Medications  Medication Dose Route Frequency Provider Last Rate Last Admin   acetaminophen (TYLENOL) tablet 650 mg  650 mg Oral Q4H PRN Karmen Bongo, MD       Or   acetaminophen (TYLENOL) 160 MG/5ML solution 650 mg  650 mg Per Tube Q4H PRN Karmen Bongo, MD       Or   acetaminophen (TYLENOL) suppository 650 mg  650 mg Rectal Q4H PRN Karmen Bongo, MD       docusate  sodium (COLACE) capsule 200 mg  200 mg Oral BID Lala Lund K, MD   200 mg at 02/12/22 0957   insulin aspart (novoLOG) injection 0-15 Units  0-15 Units Subcutaneous TID WC Karmen Bongo, MD   8 Units at 02/12/22 1220   insulin aspart (novoLOG) injection 0-5 Units  0-5 Units Subcutaneous QHS Karmen Bongo, MD   4 Units at 02/11/22 2133   lactated ringers infusion   Intravenous Continuous Thurnell Lose, MD 100 mL/hr at 02/12/22 0618 New Bag at 02/12/22 0618   levothyroxine (SYNTHROID) tablet 100 mcg  100 mcg Oral Daily Thurnell Lose, MD   100 mcg at  02/12/22 0618   magnesium hydroxide (MILK OF MAGNESIA) suspension 30 mL  30 mL Oral BID Thurnell Lose, MD   30 mL at 02/12/22 0957   methylPREDNISolone sodium succinate (SOLU-MEDROL) 1,000 mg in sodium chloride 0.9 % 50 mL IVPB  1,000 mg Intravenous Q24H de Yolanda Manges, Cortney E, NP 66 mL/hr at 02/11/22 1525 1,000 mg at 02/11/22 1525   multivitamin with minerals tablet 1 tablet  1 tablet Oral Daily Thurnell Lose, MD   1 tablet at 02/12/22 0912   pantoprazole (PROTONIX) EC tablet 40 mg  40 mg Oral Daily de Yolanda Manges, Cortney E, NP   40 mg at 02/12/22 0912   polyethylene glycol (MIRALAX / GLYCOLAX) packet 17 g  17 g Oral BID Thurnell Lose, MD   17 g at 02/12/22 0957   senna-docusate (Senokot-S) tablet 1 tablet  1 tablet Oral QHS PRN Karmen Bongo, MD         Discharge Medications: Please see discharge summary for a list of discharge medications.  Relevant Imaging Results:  Relevant Lab Results:   Additional [place], LCSW

## 2022-02-12 NOTE — Progress Notes (Signed)
PROGRESS NOTE                                                                                                                                                                                                             Patient Demographics:    Steve Richards, is a 56 y.o. male, DOB - December 14, 1965, FUX:[id number]  Outpatient Primary MD for the patient is Girtha Rm, NP-C    LOS - 3  Admit date - 02/08/2022    Chief Complaint  Patient presents with   Stroke Symptoms       Brief Narrative (HPI from H&P)    56 y.o. male with PMH significant for hepatic cirrhosis, morbid obesity, aortic valve replacement with bioprosthetic valve, who presents with word finding difficulty, tremor and vision abnormalities.  He has been having some weakness in his left hand for the past few months had some outpatient neurological workup initially few months ago but could not remain compliant with it.  For the last few weeks he has been having some tremor-like activity in his right hand, he has developed right eye visual problem for which she saw an ophthalmologist and was noted to find right-sided hemianopsia, he also has noticed some problems finding words.   Presented to the ER where MRI was concerning for subtle cortical ribboning suspicious for autoimmune, paraneoplastic etiology vs could be CJD or infectious.  He was seen by neurologist and admitted to the hospital.    Subjective:   No headache chest or abdominal pain, still has visual field defect, some trouble in finding words along with incoordination in his arms and traumas.   Assessment  & Plan :   L. hand weakness presumably from ? pinched nerve in summer 2023 who presents with expressive aphasia, ataxia and tremor with right worse than left and R hemianopsia that has progressively worsened since 01/18/22.  Note symptoms ongoing since summer 2023, patient not compliant with outpatient  neurological workup, EEG stable, CT head and MRI noted findings suspicious for  autoimmune, paraneoplastic etiology vs could be CJD or infectious.  Seen by neurology underwent LP shows elevated protein, specific testing for CGD etc. ordered by neurology - CSF autoimmune encephalopathy panel, Oligoclonal bands and IgG index, Lyme PCR, VDRL CSF, protein 14-3-3 and T-Tau with reflex to RT-Quic, CSF ACE.  Continue IV Steroids X 5  days per Neuro, PT OT and supportive care.  Seen by speech as well.  Continue to monitor closely.   HX of bioprosthetic AVR.  Monitor with supportive care.  Stable prosthesis in aortic valve physiology on last echo few months ago.  Chronic diastolic CHF EF of 79% on echo done few months ago.  Compensated.   Obesity.  BMI of 35.  Follow-up with PCP.  History of cirrhosis of liver.  Question NASH.  Stable INR and LFTs, outpatient GI workup.    Polycythemia.  Likely due to OSA, monitor.  Small stable subarachnoid hemorrhage noted on MRI.  Symptom-free.  Monitor.  Holding aspirin or anticoagulation for now.  Hypothyroidism.  Home dose Synthroid increased, repeat TSH and free T4 in 3 to 4 weeks.  DM type II.  SSI.  Lab Results  Component Value Date   HGBA1C 6.2 (H) 02/10/2022   CBG (last 3)  Recent Labs    02/11/22 1524 02/11/22 2125 02/12/22 0900  GLUCAP 189* 325* 191*        Condition - Fair  Family Communication  :  Sharee Pimple [phone] on 02/10/22, 02/12/2022  Code Status :  Full  Consults  :  Neuro  PUD Prophylaxis :    Procedures  :     LP 02/09/22  MRI - 1. Small 14 mm focus suspicious for subarachnoid hemorrhage at the right vertex adjacent to the falx. This was subtle on the earlier CT, not apparent on MRI last month. Etiology and significance is unclear, has there been recent fall? No other acute intracranial hemorrhage is identified, but there is a small number of nonspecific chronic microhemorrhages in both hemispheres. 2. Otherwise a  largely unremarkable MRI appearance of the brain, HOWEVER, suspicion of subtle abnormal cortical diffusion widely scattered in the left hemisphere. Although with no cortical edema or abnormal signal on the remaining sequences. No enhancement. Given the clinical suspicion Creutzfeldt-Jakob Disease cannot be excluded. Differential diagnosis would include autoimmune or paraneoplastic encephalitis       Disposition Plan  :    Status is: Inpatient  DVT Prophylaxis  :  Place and maintain sequential compression device Start: 02/09/22 1757   Lab Results  Component Value Date   PLT 195 02/12/2022    Diet :  Diet Order             Diet regular Room service appropriate? Yes with Assist; Fluid consistency: Thin  Diet effective now                    Inpatient Medications  Scheduled Meds:  docusate sodium  200 mg Oral BID   insulin aspart  0-15 Units Subcutaneous TID WC   insulin aspart  0-5 Units Subcutaneous QHS   levothyroxine  100 mcg Oral Daily   magnesium hydroxide  30 mL Oral BID   multivitamin with minerals  1 tablet Oral Daily   pantoprazole  40 mg Oral Daily   polyethylene glycol  17 g Oral BID   Continuous Infusions:  lactated ringers 100 mL/hr at 02/12/22 0618   methylPREDNISolone (SOLU-MEDROL) injection 1,000 mg (02/11/22 1525)   PRN Meds:.acetaminophen **OR** acetaminophen (TYLENOL) oral liquid 160 mg/5 mL **OR** acetaminophen, senna-docusate  Antibiotics  :    Anti-infectives (From admission, onward)    None         Objective:   Vitals:   02/12/22 0000 02/12/22 0400 02/12/22 0700 02/12/22 0801  BP: 112/67 127/89 (!) 140/85 (!) 140/85  Pulse: 73 72 67  69  Resp: 14 18 19 16   Temp:  98 F (36.7 C) 97.7 F (36.5 C) 97.7 F (36.5 C)  TempSrc:  Oral Oral Oral  SpO2: 92% 91% 96% 96%  Weight:      Height:        Wt Readings from Last 3 Encounters:  02/09/22 120 kg  02/08/22 118.4 kg  01/31/22 118.4 kg    No intake or output data in the 24 hours  ending 02/12/22 0920    Physical Exam  Awake, alert, mild expressive aphasia, right hemianopsia, bilateral upper extremity dysdiadochokinesis, mild tremor in both arms [place].AT,PERRAL Supple Neck, No JVD,   Symmetrical Chest wall movement, Good air movement bilaterally, CTAB RRR,No Gallops, Rubs or new Murmurs,  +ve B.Sounds, Abd Soft, No tenderness,   No Cyanosis, Clubbing or edema       Data Review:    Recent Labs  Lab 02/08/22 1818 02/08/22 1837 02/11/22 0210 02/12/22 0310  WBC 8.2  --  6.2 13.4*  HGB 17.8* 18.7* 17.0 17.2*  HCT 52.7* 55.0* 50.0 50.8  PLT 202  --  176 195  MCV 89.5  --  88.0 89.0  MCH 30.2  --  29.9 30.1  MCHC 33.8  --  34.0 33.9  RDW 13.2  --  12.9 13.2  LYMPHSABS 1.6  --  0.4* 0.5*  MONOABS 0.7  --  0.0* 0.2  EOSABS 0.1  --  0.0 0.0  BASOSABS 0.0  --  0.0 0.0    Recent Labs  Lab 02/08/22 1818 02/08/22 1837 02/10/22 0616 02/11/22 0210 02/12/22 0310  NA 135 138  --  136 140  K 3.9 3.9  --  4.3 4.1  CL 100 102  --  104 105  CO2 23  --   --  22 24  GLUCOSE 118* 118*  --  231* 165*  BUN 9 11  --  10 12  CREATININE 0.85 0.80  --  0.91 0.98  AST 42*  --   --  40 36  ALT 31  --   --  32 32  ALKPHOS 49  --   --  51 49  BILITOT 1.0  --   --  0.9 0.7  ALBUMIN 4.1  --   --  3.9 3.9  CRP  --   --  <0.5 0.5 0.5  INR 1.0  --   --   --   --   TSH  --   --  9.391*  --   --   HGBA1C  --   --  6.2*  --   --   BNP  --   --   --  52.9 34.2  MG  --   --   --  2.0 2.1  CALCIUM 9.2  --   --  9.3 9.2   ----------------------------------------------------------------------------------------------------------------- Recent Labs    02/10/22 0228  CHOL 176  HDL 30*  LDLCALC 107*  TRIG 194*  CHOLHDL 5.9    Lab Results  Component Value Date   HGBA1C 6.2 (H) 02/10/2022    Recent Labs    02/10/22 0616  TSH 9.391*      Radiology Reports CT HEAD WO CONTRAST (5MM)  Result Date: 02/09/2022 CLINICAL DATA:  Follow-up subarachnoid hemorrhage EXAM:  CT HEAD WITHOUT CONTRAST TECHNIQUE: Contiguous axial images were obtained from the base of the skull through the vertex without intravenous contrast. RADIATION DOSE REDUCTION: This exam was performed according to the departmental dose-optimization program which includes  automated exposure control, adjustment of the mA and/or kV according to patient size and/or use of iterative reconstruction technique. COMPARISON:  CT 02/08/2022 and MR brain 02/09/2022 FINDINGS: Brain: The small focus along the right superior frontal gyrus of suspected subarachnoid hemorrhage appears unchanged from the CT dated 02/08/2022. This measures approximately 1.1 cm on sagittal image 28/6. Previously 1.3 cm. No new areas of hemorrhage identified. No signs of acute stroke. Sulci and ventricular volumes appear normal. Vascular: No hyperdense vessel or unexpected calcification. Skull: Normal. Negative for fracture or focal lesion. Sinuses/Orbits: Chronic left maxillary and ethmoid mucosal thickening. Mastoid air cells are clear. Other: None. IMPRESSION: 1. Stable appearance of small focus of suspected subarachnoid hemorrhage along the right superior frontal gyrus. 2. No new areas of hemorrhage identified. 3. Chronic left maxillary and ethmoid sinus disease. Electronically Signed   By: Kerby Moors M.D.   On: 02/09/2022 18:42   EEG adult  Result Date: 02/09/2022 Landry Corporal, MD     02/09/2022 11:40 AM [facility] Routine EEG Report Video Performed: Performed Demographics: Patient Name:   Keny, Donald Date of Birth:   June 07, 1965 Identification Number:   MRN - [id number] Study Times: Study Start Time:   02/09/2022 10:18:53 Study End Time:   02/09/2022 11:09:09 Duration:   50 minutes Indication(s): Spells, Eval for Seizures Technical Summary: This EEG was performed utilizing standard International 10-20 System of electrode placement. Data were obtained and interpreted utilizing  referential montage recording, with reformatting to longitudinal, transverse bipolar, and referential montages as necessary for interpretation. State(s):      Drowsy      Asleep Activation Procedures: Hyperventilation: Not performed Photic Stimulation: Not performed EEG Description: Awake background of 6 to 7 Hz is occasionally present. Intermittent generalized theta-delta slowing was present. Excess continuous generalized theta-delta slowing was present. No normal sleep architecture is noted. Muscle, motion, electrode, and eye movement artifacts were occasionally noted. Prominent salt bridging versus sweat artifact is noted. Impression: Abnormal EEG due to:   1) Theta range background slowing  2) Intermittent generalized theta-delta slowing  3) Continuous generalized theta-delta slowing Clinical Correlation: This EEG is suggestive of a mild-moderate encephalopathy, but is nonspecific as to etiology. The absence of epileptiform abnormalities does not preclude a clinical diagnosis of seizures. [facility] For Inpatient follow-up with [facility] physician please call [facility] [phone]. This is not an outpatient service. Post hospital discharge, please contact hospital directly.   MR BRAIN W WO CONTRAST  Result Date: 02/09/2022 CLINICAL DATA:  56 year old male with progressive neurologic deficits since last month. Aphasia, abnormal gait, right arm tremor, now with right lower extremity tremor and progressive. And progressive right greater than left hemianopsia. Neurology considering autoimmune versus paraneoplastic etiology versus CJD. History of cirrhosis. EXAM: MRI HEAD WITHOUT AND WITH CONTRAST TECHNIQUE: Multiplanar, multiecho pulse sequences of the brain and surrounding structures were obtained without and with intravenous contrast. CONTRAST:  52m GADAVIST GADOBUTROL 1 MMOL/ML IV SOLN COMPARISON:  Head CT yesterday. Brain MRI without and with contrast 01/30/2022. FINDINGS: Brain: Cerebral volume  appears stable and within normal limits for age. But along the right superior frontal gyrus there is a seemingly small new nodular roughly 14 mm extra-axial lesion with FLAIR hyperintensity (series 11, image 24), partial intrinsic T1 hyperintensity (series 16, image 49) and questionable enhancement (series 19, images 19 and 20) which is subtle on T2 and diffusion but was not clearly visible on MRI last month. This has a nonspecific intermediate density appearance on MRI this morning, but  demonstrates pronounced susceptibility on SWI (series 14, image 49) and is therefore suspicious for trace subarachnoid hemorrhage. Adjacent superior sagittal sinus seems unremarkable. Punctate chronic microhemorrhage in the left frontal lobe white matter and right parietal lobe on series 14, image 35 are stable from last month but more conspicuous than on the T2* imaging at that time. And there appear to be 2 additional punctate chronic microhemorrhages in the frontal lobe white matter also on image 35. But no other chronic cerebral blood products. On this 1.5 Tesla exam today there is suspicious asymmetric cortical increased diffusion affecting multiple sites in the left hemisphere, 1 of the most conspicuous areas is the left parietal lobe (series 5, image 87). This might have been present last month, but as on that exam there is no convincing T2 or FLAIR cortex correlation, no convincing gyral edema. Similar subtle asymmetry present on the coronal DWI. No abnormal enhancement identified. No dural thickening. Background gray and white matter signal remains normal otherwise. No restricted diffusion suggestive of acute infarction. No midline shift, mass effect. No ventriculomegaly. No ventricular debris. Cervicomedullary junction and pituitary are within normal limits. No intrinsic T1 hyperintensity in the deep gray nuclei. Vascular: Major intracranial vascular flow voids are stable, preserved. The major dural venous sinuses are  enhancing and appear to be patent. Skull and upper cervical spine: Negative. Visualized bone marrow signal is within normal limits. Sinuses/Orbits: Rightward gaze. Normal suprasellar cistern and unremarkable optic chiasm. Chronic appearing left maxillary wall deformity. Left maxillary and ethmoid mucosal thickening. Other: Mastoids remain clear. Visible internal auditory structures appear normal. IMPRESSION: 1. Small 14 mm focus suspicious for subarachnoid hemorrhage at the right vertex adjacent to the falx. This was subtle on the earlier CT, not apparent on MRI last month. Etiology and significance is unclear, has there been recent fall? No other acute intracranial hemorrhage is identified, but there is a small number of nonspecific chronic microhemorrhages in both hemispheres. 2. Otherwise a largely unremarkable MRI appearance of the brain, HOWEVER, suspicion of subtle abnormal cortical diffusion widely scattered in the left hemisphere. Although with no cortical edema or abnormal signal on the remaining sequences. No enhancement. Given the clinical suspicion Creutzfeldt-Jakob Disease cannot be excluded. Differential diagnosis would include autoimmune or paraneoplastic encephalitis - although absence of T2/FLAIR abnormality argues against those. Study discussed by telephone with Dr. Donnetta Simpers on 02/09/2022 at 06:48. Electronically Signed   By: Genevie Ann M.D.   On: 02/09/2022 06:49   CT HEAD WO CONTRAST  Result Date: 02/08/2022 CLINICAL DATA:  Neuro deficit, acute, stroke suspected. Expressive aphasia, abnormal gait. EXAM: CT HEAD WITHOUT CONTRAST TECHNIQUE: Contiguous axial images were obtained from the base of the skull through the vertex without intravenous contrast. RADIATION DOSE REDUCTION: This exam was performed according to the departmental dose-optimization program which includes automated exposure control, adjustment of the mA and/or kV according to patient size and/or use of iterative  reconstruction technique. COMPARISON:  MRI 01/30/2022 FINDINGS: Brain: Normal anatomic configuration. No abnormal intra or extra-axial mass lesion or fluid collection. No abnormal mass effect or midline shift. No evidence of acute intracranial hemorrhage or infarct. Ventricular size is normal. Cerebellum unremarkable. Vascular: Unremarkable Skull: Intact Sinuses/Orbits: Air-fluid level within the left maxillary sinus has resolved though mild mucosal thickening persists. Dense opacification of the left frontal sinus and several left ethmoid air cells persists, however. Remaining paranasal sinuses are clear. Orbits are unremarkable. Other: Mastoid air cells and middle ear cavities are clear. IMPRESSION: 1. No acute intracranial abnormality. No  calvarial fracture. 2. Improved left maxillary sinus disease. Persistent left frontal and ethmoid paranasal sinus disease. Electronically Signed   By: Fidela Salisbury M.D.   On: 02/08/2022 19:30      Signature  -   Lala Lund M.D on 02/12/2022 at 9:20 AM   -  To page go to [email or web address]

## 2022-02-12 NOTE — Plan of Care (Signed)
Problem: Education: Goal: Ability to describe self-care measures that may prevent or decrease complications (Diabetes Survival Skills Education) will improve 02/12/2022 0558 by Arnold Long, RN Outcome: Progressing 02/12/2022 0558 by Arnold Long, RN Outcome: Progressing Goal: Individualized Educational Video(s) 02/12/2022 0558 by Arnold Long, RN Outcome: Progressing 02/12/2022 0558 by Arnold Long, RN Outcome: Progressing   Problem: Education: Goal: Ability to describe self-care measures that may prevent or decrease complications (Diabetes Survival Skills Education) will improve 02/12/2022 0558 by Arnold Long, RN Outcome: Progressing 02/12/2022 0558 by Arnold Long, RN Outcome: Progressing Goal: Individualized Educational Video(s) 02/12/2022 0558 by Arnold Long, RN Outcome: Progressing 02/12/2022 0558 by Arnold Long, RN Outcome: Progressing   Problem: Coping: Goal: Ability to adjust to condition or change in health will improve 02/12/2022 0558 by Arnold Long, RN Outcome: Progressing 02/12/2022 0558 by Arnold Long, RN Outcome: Progressing   Problem: Fluid Volume: Goal: Ability to maintain a balanced intake and output will improve 02/12/2022 0558 by Arnold Long, RN Outcome: Progressing 02/12/2022 0558 by Arnold Long, RN Outcome: Progressing   Problem: Health Behavior/Discharge Planning: Goal: Ability to identify and utilize available resources and services will improve 02/12/2022 0558 by Arnold Long, RN Outcome: Progressing 02/12/2022 0558 by Arnold Long, RN Outcome: Progressing Goal: Ability to manage health-related needs will improve 02/12/2022 0558 by Arnold Long, RN Outcome: Progressing 02/12/2022 0558 by Arnold Long, RN Outcome: Progressing   Problem: Metabolic: Goal: Ability to maintain appropriate glucose levels will improve 02/12/2022 0558 by Arnold Long, RN Outcome: Progressing 02/12/2022  0558 by Arnold Long, RN Outcome: Progressing   Problem: Nutritional: Goal: Maintenance of adequate nutrition will improve 02/12/2022 0558 by Arnold Long, RN Outcome: Progressing 02/12/2022 0558 by Arnold Long, RN Outcome: Progressing Goal: Progress toward achieving an optimal weight will improve 02/12/2022 0558 by Arnold Long, RN Outcome: Progressing 02/12/2022 0558 by Arnold Long, RN Outcome: Progressing   Problem: Skin Integrity: Goal: Risk for impaired skin integrity will decrease 02/12/2022 0558 by Arnold Long, RN Outcome: Progressing 02/12/2022 0558 by Arnold Long, RN Outcome: Progressing   Problem: Tissue Perfusion: Goal: Adequacy of tissue perfusion will improve 02/12/2022 0558 by Arnold Long, RN Outcome: Progressing 02/12/2022 0558 by Arnold Long, RN Outcome: Progressing   Problem: Education: Goal: Knowledge of disease or condition will improve 02/12/2022 0558 by Arnold Long, RN Outcome: Progressing 02/12/2022 0558 by Arnold Long, RN Outcome: Progressing Goal: Knowledge of secondary prevention will improve (MUST DOCUMENT ALL) 02/12/2022 0558 by Arnold Long, RN Outcome: Progressing 02/12/2022 0558 by Arnold Long, RN Outcome: Progressing Goal: Knowledge of patient specific risk factors will improve Elta [place] N/A or DELETE if not current risk factor) 02/12/2022 0558 by Arnold Long, RN Outcome: Progressing 02/12/2022 0558 by Arnold Long, RN Outcome: Progressing   Problem: Ischemic Stroke/TIA Tissue Perfusion: Goal: Complications of ischemic stroke/TIA will be minimized 02/12/2022 0558 by Arnold Long, RN Outcome: Progressing 02/12/2022 0558 by Arnold Long, RN Outcome: Progressing   Problem: Coping: Goal: Will verbalize positive feelings about self 02/12/2022 0558 by Arnold Long, RN Outcome: Progressing 02/12/2022 0558 by Arnold Long, RN Outcome: Progressing Goal: Will identify  appropriate support needs 02/12/2022 0558 by Arnold Long, RN Outcome: Progressing 02/12/2022 0558 by Arnold Long, RN Outcome: Progressing   Problem: Health Behavior/Discharge Planning: Goal: Ability to manage health-related needs will improve 02/12/2022 0558 by Arnold Long, RN Outcome: Progressing 02/12/2022 0558 by Arnold Long, RN Outcome: Progressing Goal: Goals will be collaboratively established with patient/family 02/12/2022 0558 by Arnold Long, RN Outcome: Progressing 02/12/2022 0558 by Arnold Long, RN Outcome: Progressing  Problem: Self-Care: Goal: Ability to participate in self-care as condition permits will improve 02/12/2022 0558 by Arnold Long, RN Outcome: Progressing 02/12/2022 0558 by Arnold Long, RN Outcome: Progressing Goal: Verbalization of feelings and concerns over difficulty with self-care will improve 02/12/2022 0558 by Arnold Long, RN Outcome: Progressing 02/12/2022 0558 by Arnold Long, RN Outcome: Progressing Goal: Ability to communicate needs accurately will improve 02/12/2022 0558 by Arnold Long, RN Outcome: Progressing 02/12/2022 0558 by Arnold Long, RN Outcome: Progressing   Problem: Nutrition: Goal: Risk of aspiration will decrease 02/12/2022 0558 by Arnold Long, RN Outcome: Progressing 02/12/2022 0558 by Arnold Long, RN Outcome: Progressing Goal: Dietary intake will improve 02/12/2022 0558 by Arnold Long, RN Outcome: Progressing 02/12/2022 0558 by Arnold Long, RN Outcome: Progressing   Problem: Education: Goal: Knowledge of General Education information will improve Description: Including pain rating scale, medication(s)/side effects and non-pharmacologic comfort measures 02/12/2022 0558 by Arnold Long, RN Outcome: Progressing 02/12/2022 0558 by Arnold Long, RN Outcome: Progressing   Problem: Health Behavior/Discharge Planning: Goal: Ability to manage  health-related needs will improve 02/12/2022 0558 by Arnold Long, RN Outcome: Progressing 02/12/2022 0558 by Arnold Long, RN Outcome: Progressing   Problem: Clinical Measurements: Goal: Ability to maintain clinical measurements within normal limits will improve 02/12/2022 0558 by Arnold Long, RN Outcome: Progressing 02/12/2022 0558 by Arnold Long, RN Outcome: Progressing Goal: Will remain free from infection 02/12/2022 0558 by Arnold Long, RN Outcome: Progressing 02/12/2022 0558 by Arnold Long, RN Outcome: Progressing Goal: Diagnostic test results will improve 02/12/2022 0558 by Arnold Long, RN Outcome: Progressing 02/12/2022 0558 by Arnold Long, RN Outcome: Progressing Goal: Respiratory complications will improve 02/12/2022 0558 by Arnold Long, RN Outcome: Progressing 02/12/2022 0558 by Arnold Long, RN Outcome: Progressing Goal: Cardiovascular complication will be avoided 02/12/2022 0558 by Arnold Long, RN Outcome: Progressing 02/12/2022 0558 by Arnold Long, RN Outcome: Progressing   Problem: Activity: Goal: Risk for activity intolerance will decrease 02/12/2022 0558 by Arnold Long, RN Outcome: Progressing 02/12/2022 0558 by Arnold Long, RN Outcome: Progressing   Problem: Nutrition: Goal: Adequate nutrition will be maintained 02/12/2022 0558 by Arnold Long, RN Outcome: Progressing 02/12/2022 0558 by Arnold Long, RN Outcome: Progressing   Problem: Coping: Goal: Level of anxiety will decrease 02/12/2022 0558 by Arnold Long, RN Outcome: Progressing 02/12/2022 0558 by Arnold Long, RN Outcome: Progressing   Problem: Elimination: Goal: Will not experience complications related to bowel motility 02/12/2022 0558 by Arnold Long, RN Outcome: Progressing 02/12/2022 0558 by Arnold Long, RN Outcome: Progressing Goal: Will not experience complications related to urinary retention 02/12/2022  0558 by Arnold Long, RN Outcome: Progressing 02/12/2022 0558 by Arnold Long, RN Outcome: Progressing   Problem: Education: Goal: Knowledge of disease or condition will improve 02/12/2022 0558 by Arnold Long, RN Outcome: Progressing 02/12/2022 0558 by Arnold Long, RN Outcome: Progressing Goal: Knowledge of secondary prevention will improve (MUST DOCUMENT ALL) 02/12/2022 0558 by Arnold Long, RN Outcome: Progressing 02/12/2022 0558 by Arnold Long, RN Outcome: Progressing Goal: Knowledge of patient specific risk factors will improve [place] N/A or DELETE if not current risk factor) 02/12/2022 0558 by Arnold Long, RN Outcome: Progressing 02/12/2022 0558 by Arnold Long, RN Outcome: Progressing   Problem: Ischemic Stroke/TIA Tissue Perfusion: Goal: Complications of ischemic stroke/TIA will be minimized 02/12/2022 0558 by Arnold Long, RN Outcome: Progressing 02/12/2022 0558 by Arnold Long, RN Outcome: Progressing   Problem: Coping: Goal: Will verbalize positive feelings about self 02/12/2022 0558 by Arnold Long, RN Outcome: Progressing 02/12/2022 0558 by Arnold Long, RN Outcome: Progressing Goal: Will identify  appropriate support needs 02/12/2022 0558 by Arnold Long, RN Outcome: Progressing 02/12/2022 0558 by Arnold Long, RN Outcome: Progressing   Problem: Health Behavior/Discharge Planning: Goal: Ability to manage health-related needs will improve 02/12/2022 0558 by Arnold Long, RN Outcome: Progressing 02/12/2022 0558 by Arnold Long, RN Outcome: Progressing Goal: Goals will be collaboratively established with patient/family 02/12/2022 0558 by Arnold Long, RN Outcome: Progressing 02/12/2022 0558 by Arnold Long, RN Outcome: Progressing   Problem: Self-Care: Goal: Ability to participate in self-care as condition permits will improve 02/12/2022 0558 by Arnold Long, RN Outcome:  Progressing 02/12/2022 0558 by Arnold Long, RN Outcome: Progressing Goal: Verbalization of feelings and concerns over difficulty with self-care will improve 02/12/2022 0558 by Arnold Long, RN Outcome: Progressing 02/12/2022 0558 by Arnold Long, RN Outcome: Progressing Goal: Ability to communicate needs accurately will improve 02/12/2022 0558 by Arnold Long, RN Outcome: Progressing 02/12/2022 0558 by Arnold Long, RN Outcome: Progressing   Problem: Nutrition: Goal: Risk of aspiration will decrease 02/12/2022 0558 by Arnold Long, RN Outcome: Progressing 02/12/2022 0558 by Arnold Long, RN Outcome: Progressing Goal: Dietary intake will improve 02/12/2022 0558 by Arnold Long, RN Outcome: Progressing 02/12/2022 0558 by Arnold Long, RN Outcome: Progressing   Problem: Education: Goal: Ability to describe self-care measures that may prevent or decrease complications (Diabetes Survival Skills Education) will improve 02/12/2022 0558 by Arnold Long, RN Outcome: Progressing 02/12/2022 0558 by Arnold Long, RN Outcome: Progressing Goal: Individualized Educational Video(s) 02/12/2022 0558 by Arnold Long, RN Outcome: Progressing 02/12/2022 0558 by Arnold Long, RN Outcome: Progressing   Problem: Coping: Goal: Ability to adjust to condition or change in health will improve 02/12/2022 0558 by Arnold Long, RN Outcome: Progressing 02/12/2022 0558 by Arnold Long, RN Outcome: Progressing   Problem: Fluid Volume: Goal: Ability to maintain a balanced intake and output will improve 02/12/2022 0558 by Arnold Long, RN Outcome: Progressing 02/12/2022 0558 by Arnold Long, RN Outcome: Progressing   Problem: Health Behavior/Discharge Planning: Goal: Ability to identify and utilize available resources and services will improve 02/12/2022 0558 by Arnold Long, RN Outcome: Progressing 02/12/2022 0558 by Arnold Long,  RN Outcome: Progressing Goal: Ability to manage health-related needs will improve 02/12/2022 0558 by Arnold Long, RN Outcome: Progressing 02/12/2022 0558 by Arnold Long, RN Outcome: Progressing   Problem: Metabolic: Goal: Ability to maintain appropriate glucose levels will improve 02/12/2022 0558 by Arnold Long, RN Outcome: Progressing 02/12/2022 0558 by Arnold Long, RN Outcome: Progressing   Problem: Nutritional: Goal: Maintenance of adequate nutrition will improve 02/12/2022 0558 by Arnold Long, RN Outcome: Progressing 02/12/2022 0558 by Arnold Long, RN Outcome: Progressing Goal: Progress toward achieving an optimal weight will improve 02/12/2022 0558 by Arnold Long, RN Outcome: Progressing 02/12/2022 0558 by Arnold Long, RN Outcome: Progressing   Problem: Skin Integrity: Goal: Risk for impaired skin integrity will decrease 02/12/2022 0558 by Arnold Long, RN Outcome: Progressing 02/12/2022 0558 by Arnold Long, RN Outcome: Progressing   Problem: Tissue Perfusion: Goal: Adequacy of tissue perfusion will improve 02/12/2022 0558 by Arnold Long, RN Outcome: Progressing 02/12/2022 0558 by Arnold Long, RN Outcome: Progressing   Problem: Education: Goal: Knowledge of disease or condition will improve 02/12/2022 0558 by Arnold Long, RN Outcome: Progressing 02/12/2022 0558 by Arnold Long, RN Outcome: Progressing Goal: Knowledge of secondary prevention will improve (MUST DOCUMENT ALL) 02/12/2022 0558 by Arnold Long, RN Outcome: Progressing 02/12/2022 0558 by Arnold Long, RN Outcome: Progressing Goal: Knowledge of patient specific risk factors will improve [place] N/A or DELETE if not current risk factor) 02/12/2022 0558 by Arnold Long,  RN Outcome: Progressing 02/12/2022 0558 by Arnold Long, RN Outcome: Progressing   Problem: Ischemic Stroke/TIA Tissue Perfusion: Goal: Complications of ischemic  stroke/TIA will be minimized 02/12/2022 0558 by Arnold Long, RN Outcome: Progressing 02/12/2022 0558 by Arnold Long, RN Outcome: Progressing   Problem: Coping: Goal: Will verbalize positive feelings about self 02/12/2022 0558 by Arnold Long, RN Outcome: Progressing 02/12/2022 0558 by Arnold Long, RN Outcome: Progressing Goal: Will identify appropriate support needs 02/12/2022 0558 by Arnold Long, RN Outcome: Progressing 02/12/2022 0558 by Arnold Long, RN Outcome: Progressing   Problem: Health Behavior/Discharge Planning: Goal: Ability to manage health-related needs will improve 02/12/2022 0558 by Arnold Long, RN Outcome: Progressing 02/12/2022 0558 by Arnold Long, RN Outcome: Progressing Goal: Goals will be collaboratively established with patient/family 02/12/2022 0558 by Arnold Long, RN Outcome: Progressing 02/12/2022 0558 by Arnold Long, RN Outcome: Progressing   Problem: Self-Care: Goal: Ability to participate in self-care as condition permits will improve 02/12/2022 0558 by Arnold Long, RN Outcome: Progressing 02/12/2022 0558 by Arnold Long, RN Outcome: Progressing Goal: Verbalization of feelings and concerns over difficulty with self-care will improve 02/12/2022 0558 by Arnold Long, RN Outcome: Progressing 02/12/2022 0558 by Arnold Long, RN Outcome: Progressing Goal: Ability to communicate needs accurately will improve 02/12/2022 0558 by Arnold Long, RN Outcome: Progressing 02/12/2022 0558 by Arnold Long, RN Outcome: Progressing   Problem: Nutrition: Goal: Risk of aspiration will decrease 02/12/2022 0558 by Arnold Long, RN Outcome: Progressing 02/12/2022 0558 by Arnold Long, RN Outcome: Progressing Goal: Dietary intake will improve 02/12/2022 0558 by Arnold Long, RN Outcome: Progressing 02/12/2022 0558 by Arnold Long, RN Outcome: Progressing   Problem: Education: Goal:  Knowledge of General Education information will improve Description: Including pain rating scale, medication(s)/side effects and non-pharmacologic comfort measures 02/12/2022 0558 by Arnold Long, RN Outcome: Progressing 02/12/2022 0558 by Arnold Long, RN Outcome: Progressing   Problem: Health Behavior/Discharge Planning: Goal: Ability to manage health-related needs will improve 02/12/2022 0558 by Arnold Long, RN Outcome: Progressing 02/12/2022 0558 by Arnold Long, RN Outcome: Progressing   Problem: Clinical Measurements: Goal: Ability to maintain clinical measurements within normal limits will improve 02/12/2022 0558 by Arnold Long, RN Outcome: Progressing 02/12/2022 0558 by Arnold Long, RN Outcome: Progressing Goal: Will remain free from infection 02/12/2022 0558 by Arnold Long, RN Outcome: Progressing 02/12/2022 0558 by Arnold Long, RN Outcome: Progressing Goal: Diagnostic test results will improve 02/12/2022 0558 by Arnold Long, RN Outcome: Progressing 02/12/2022 0558 by Arnold Long, RN Outcome: Progressing Goal: Respiratory complications will improve 02/12/2022 0558 by Arnold Long, RN Outcome: Progressing 02/12/2022 0558 by Arnold Long, RN Outcome: Progressing Goal: Cardiovascular complication will be avoided 02/12/2022 0558 by Arnold Long, RN Outcome: Progressing 02/12/2022 0558 by Arnold Long, RN Outcome: Progressing   Problem: Activity: Goal: Risk for activity intolerance will decrease 02/12/2022 0558 by Arnold Long, RN Outcome: Progressing 02/12/2022 0558 by Arnold Long, RN Outcome: Progressing   Problem: Nutrition: Goal: Adequate nutrition will be maintained 02/12/2022 0558 by Arnold Long, RN Outcome: Progressing 02/12/2022 0558 by Arnold Long, RN Outcome: Progressing   Problem: Coping: Goal: Level of anxiety will decrease 02/12/2022 0558 by Arnold Long, RN Outcome:  Progressing 02/12/2022 0558 by Arnold Long, RN Outcome: Progressing   Problem: Elimination: Goal: Will not experience complications related to bowel motility 02/12/2022 0558 by Arnold Long, RN Outcome: Progressing 02/12/2022 0558 by Arnold Long, RN Outcome: Progressing Goal: Will not experience complications related to urinary retention 02/12/2022 0558 by Arnold Long, RN Outcome: Progressing 02/12/2022 0558 by Arnold Long, RN Outcome: Progressing  Problem: Education: Goal: Knowledge of disease or condition will improve 02/12/2022 0558 by Arnold Long, RN Outcome: Progressing 02/12/2022 0558 by Arnold Long, RN Outcome: Progressing Goal: Knowledge of secondary prevention will improve (MUST DOCUMENT ALL) 02/12/2022 0558 by Arnold Long, RN Outcome: Progressing 02/12/2022 0558 by Arnold Long, RN Outcome: Progressing Goal: Knowledge of patient specific risk factors will improve [place] N/A or DELETE if not current risk factor) 02/12/2022 0558 by Arnold Long, RN Outcome: Progressing 02/12/2022 0558 by Arnold Long, RN Outcome: Progressing   Problem: Ischemic Stroke/TIA Tissue Perfusion: Goal: Complications of ischemic stroke/TIA will be minimized 02/12/2022 0558 by Arnold Long, RN Outcome: Progressing 02/12/2022 0558 by Arnold Long, RN Outcome: Progressing   Problem: Coping: Goal: Will verbalize positive feelings about self 02/12/2022 0558 by Arnold Long, RN Outcome: Progressing 02/12/2022 0558 by Arnold Long, RN Outcome: Progressing Goal: Will identify appropriate support needs 02/12/2022 0558 by Arnold Long, RN Outcome: Progressing 02/12/2022 0558 by Arnold Long, RN Outcome: Progressing   Problem: Health Behavior/Discharge Planning: Goal: Ability to manage health-related needs will improve 02/12/2022 0558 by Arnold Long, RN Outcome: Progressing 02/12/2022 0558 by Arnold Long, RN Outcome:  Progressing Goal: Goals will be collaboratively established with patient/family 02/12/2022 0558 by Arnold Long, RN Outcome: Progressing 02/12/2022 0558 by Arnold Long, RN Outcome: Progressing   Problem: Self-Care: Goal: Ability to participate in self-care as condition permits will improve 02/12/2022 0558 by Arnold Long, RN Outcome: Progressing 02/12/2022 0558 by Arnold Long, RN Outcome: Progressing Goal: Verbalization of feelings and concerns over difficulty with self-care will improve 02/12/2022 0558 by Arnold Long, RN Outcome: Progressing 02/12/2022 0558 by Arnold Long, RN Outcome: Progressing Goal: Ability to communicate needs accurately will improve 02/12/2022 0558 by Arnold Long, RN Outcome: Progressing 02/12/2022 0558 by Arnold Long, RN Outcome: Progressing   Problem: Nutrition: Goal: Risk of aspiration will decrease 02/12/2022 0558 by Arnold Long, RN Outcome: Progressing 02/12/2022 0558 by Arnold Long, RN Outcome: Progressing Goal: Dietary intake will improve 02/12/2022 0558 by Arnold Long, RN Outcome: Progressing 02/12/2022 0558 by Arnold Long, RN Outcome: Progressing

## 2022-02-12 NOTE — Progress Notes (Signed)
Inpatient Rehab Admissions Coordinator:    I spoke with pt. Regarding potential CIR admit. He is potentially interested once work up is complete. States he is not sure if he has 24/7 support, which he will likely need after CIR. I will reach out to his sister to discuss.   Clemens Catholic, [place], [facility] Admissions Coordinator  [phone] ([place]) [phone] (office)

## 2022-02-13 DIAGNOSIS — H5347 Heteronymous bilateral field defects: Secondary | ICD-10-CM | POA: Diagnosis not present

## 2022-02-13 DIAGNOSIS — R270 Ataxia, unspecified: Secondary | ICD-10-CM | POA: Diagnosis not present

## 2022-02-13 LAB — GLUCOSE, CAPILLARY
Glucose-Capillary: 163 mg/dL — ABNORMAL HIGH (ref 70–99)
Glucose-Capillary: 163 mg/dL — ABNORMAL HIGH (ref 70–99)
Glucose-Capillary: 202 mg/dL — ABNORMAL HIGH (ref 70–99)
Glucose-Capillary: 211 mg/dL — ABNORMAL HIGH (ref 70–99)

## 2022-02-13 LAB — COMPREHENSIVE METABOLIC PANEL
ALT: 35 U/L (ref 0–44)
AST: 35 U/L (ref 15–41)
Albumin: 3.7 g/dL (ref 3.5–5.0)
Alkaline Phosphatase: 47 U/L (ref 38–126)
Anion gap: 11 (ref 5–15)
BUN: 15 mg/dL (ref 6–20)
CO2: 25 mmol/L (ref 22–32)
Calcium: 8.7 mg/dL — ABNORMAL LOW (ref 8.9–10.3)
Chloride: 103 mmol/L (ref 98–111)
Creatinine, Ser: 0.9 mg/dL (ref 0.61–1.24)
GFR, Estimated: >60 mL/min (ref >60)
Glucose, Bld: 178 mg/dL — ABNORMAL HIGH (ref 70–99)
Potassium: 4.8 mmol/L (ref 3.5–5.1)
Sodium: 139 mmol/L (ref 135–145)
Total Bilirubin: 0.7 mg/dL (ref 0.3–1.2)
Total Protein: 6.3 g/dL — ABNORMAL LOW (ref 6.5–8.1)

## 2022-02-13 LAB — CBC WITH DIFFERENTIAL/PLATELET
Abs Immature Granulocytes: 0.04 10*3/uL (ref 0.00–0.07)
Basophils Absolute: 0 10*3/uL (ref 0.0–0.1)
Basophils Relative: 0 %
Eosinophils Absolute: 0 10*3/uL (ref 0.0–0.5)
Eosinophils Relative: 0 %
HCT: 49.3 % (ref 39.0–52.0)
Hemoglobin: 16.3 g/dL (ref 13.0–17.0)
Immature Granulocytes: 0 %
Lymphocytes Relative: 4 %
Lymphs Abs: 0.5 10*3/uL — ABNORMAL LOW (ref 0.7–4.0)
MCH: 29.7 pg (ref 26.0–34.0)
MCHC: 33.1 g/dL (ref 30.0–36.0)
MCV: 89.8 fL (ref 80.0–100.0)
Monocytes Absolute: 0.1 10*3/uL (ref 0.1–1.0)
Monocytes Relative: 1 %
Neutro Abs: 10.2 10*3/uL — ABNORMAL HIGH (ref 1.7–7.7)
Neutrophils Relative %: 95 %
Platelets: 149 10*3/uL — ABNORMAL LOW (ref 150–400)
RBC: 5.49 MIL/uL (ref 4.22–5.81)
RDW: 13.2 % (ref 11.5–15.5)
WBC: 10.9 10*3/uL — ABNORMAL HIGH (ref 4.0–10.5)
nRBC: 0 % (ref 0.0–0.2)

## 2022-02-13 LAB — C-REACTIVE PROTEIN: CRP: <0.5 mg/dL (ref <1.0)

## 2022-02-13 LAB — MAGNESIUM: Magnesium: 2.4 mg/dL (ref 1.7–2.4)

## 2022-02-13 LAB — BRAIN NATRIURETIC PEPTIDE: B Natriuretic Peptide: 433 pg/mL — ABNORMAL HIGH (ref 0.0–100.0)

## 2022-02-13 NOTE — Progress Notes (Signed)
PROGRESS NOTE                                                                                                                                                                                                             Patient Demographics:    Steve Richards, is a 56 y.o. male, DOB - 18-Apr-1965, TXH:[id number]  Outpatient Primary MD for the patient is Girtha Rm, NP-C    LOS - 4  Admit date - 02/08/2022    Chief Complaint  Patient presents with   Stroke Symptoms       Brief Narrative (HPI from H&P)    57 y.o. male with PMH significant for hepatic cirrhosis, morbid obesity, aortic valve replacement with bioprosthetic valve, who presents with word finding difficulty, tremor and vision abnormalities.  He has been having some weakness in his left hand for the past few months had some outpatient neurological workup initially few months ago but could not remain compliant with it.  For the last few weeks he has been having some tremor-like activity in his right hand, he has developed right eye visual problem for which she saw an ophthalmologist and was noted to find right-sided hemianopsia, he also has noticed some problems finding words.   Presented to the ER where MRI was concerning for subtle cortical ribboning suspicious for autoimmune, paraneoplastic etiology vs could be CJD or infectious.  He was seen by neurologist and admitted to the hospital.    Subjective:   No headache chest or abdominal pain, still has visual field defect, some trouble in finding words along with incoordination in his arms and traumas.   Assessment  & Plan :   L. hand weakness presumably from ? pinched nerve in summer 2023 who presents with expressive aphasia, ataxia and tremor with right worse than left and R hemianopsia that has progressively worsened since 01/18/22.  Note symptoms ongoing since summer 2023, patient not compliant with outpatient  neurological workup, EEG stable, CT head and MRI noted findings suspicious for  autoimmune, paraneoplastic etiology vs could be CJD or infectious.  Seen by neurology underwent LP shows elevated protein, specific testing for CGD etc. ordered by neurology - CSF autoimmune encephalopathy panel, Oligoclonal bands and IgG index, Lyme PCR, VDRL CSF, protein 14-3-3 and T-Tau with reflex to RT-Quic, CSF ACE.  Continue IV Steroids X 5  days per Neuro, PT OT and supportive care.  Seen by speech as well.  Continue to monitor closely.   HX of bioprosthetic AVR.  Monitor with supportive care.  Stable prosthesis in aortic valve physiology on last echo few months ago.  Chronic diastolic CHF EF of 34% on echo done few months ago.  Compensated.   Obesity.  BMI of 35.  Follow-up with PCP.  History of cirrhosis of liver.  Question NASH.  Stable INR and LFTs, outpatient GI workup.    Polycythemia.  Likely due to OSA, monitor.  Small stable subarachnoid hemorrhage noted on MRI.  Symptom-free.  Monitor.  Holding aspirin or anticoagulation for now.  Hypothyroidism.  Home dose Synthroid increased, repeat TSH and free T4 in 3 to 4 weeks.  DM type II.  SSI.  Lab Results  Component Value Date   HGBA1C 6.2 (H) 02/10/2022   CBG (last 3)  Recent Labs    02/12/22 1651 02/12/22 1956 02/13/22 0821  GLUCAP 197* 187* 163*        Condition - Fair  Family Communication  :  Steve Richards-[phone] on 02/10/22, 02/12/2022  Code Status :  Full  Consults  :  Neuro  PUD Prophylaxis :    Procedures  :     LP 02/09/22  MRI - 1. Small 14 mm focus suspicious for subarachnoid hemorrhage at the right vertex adjacent to the falx. This was subtle on the earlier CT, not apparent on MRI last month. Etiology and significance is unclear, has there been recent fall? No other acute intracranial hemorrhage is identified, but there is a small number of nonspecific chronic microhemorrhages in both hemispheres. 2. Otherwise a  largely unremarkable MRI appearance of the brain, HOWEVER, suspicion of subtle abnormal cortical diffusion widely scattered in the left hemisphere. Although with no cortical edema or abnormal signal on the remaining sequences. No enhancement. Given the clinical suspicion Creutzfeldt-Jakob Disease cannot be excluded. Differential diagnosis would include autoimmune or paraneoplastic encephalitis       Disposition Plan  :    Status is: Inpatient  DVT Prophylaxis  :  Place and maintain sequential compression device Start: 02/09/22 1757   Lab Results  Component Value Date   PLT 149 (L) 02/13/2022    Diet :  Diet Order             Diet regular Room service appropriate? Yes with Assist; Fluid consistency: Thin  Diet effective now                    Inpatient Medications  Scheduled Meds:  docusate sodium  200 mg Oral BID   insulin aspart  0-15 Units Subcutaneous TID WC   insulin aspart  0-5 Units Subcutaneous QHS   levothyroxine  100 mcg Oral Daily   multivitamin with minerals  1 tablet Oral Daily   pantoprazole  40 mg Oral Daily   polyethylene glycol  17 g Oral BID   Continuous Infusions:  methylPREDNISolone (SOLU-MEDROL) injection 1,000 mg (02/12/22 1623)   PRN Meds:.acetaminophen **OR** acetaminophen (TYLENOL) oral liquid 160 mg/5 mL **OR** acetaminophen, senna-docusate  Antibiotics  :    Anti-infectives (From admission, onward)    None         Objective:   Vitals:   02/12/22 0700 02/12/22 0801 02/12/22 1132 02/12/22 1627  BP: (!) 140/85 (!) 140/85 (!) 143/89 133/85  Pulse: 67 69 84 80  Resp: 19 16 13 19   Temp: 97.7 F (36.5 C) 97.7 F (36.5  C) 97.6 F (36.4 C) 98 F (36.7 C)  TempSrc: Oral Oral Oral Oral  SpO2: 96% 96% 93% 99%  Weight:      Height:        Wt Readings from Last 3 Encounters:  02/09/22 120 kg  02/08/22 118.4 kg  01/31/22 118.4 kg     Intake/Output Summary (Last 24 hours) at 02/13/2022 0918 Last data filed at 02/12/2022  1242 Gross per 24 hour  Intake 240 ml  Output --  Net 240 ml      Physical Exam  Awake, alert, mild expressive aphasia, right hemianopsia, bilateral upper extremity dysdiadochokinesis, mild tremor in both arms [place].AT,PERRAL Supple Neck, No JVD,   Symmetrical Chest wall movement, Good air movement bilaterally, CTAB RRR,No Gallops, Rubs or new Murmurs,  +ve B.Sounds, Abd Soft, No tenderness,   No Cyanosis, Clubbing or edema        Data Review:    Recent Labs  Lab 02/08/22 1818 02/08/22 1837 02/11/22 0210 02/12/22 0310 02/13/22 0321  WBC 8.2  --  6.2 13.4* 10.9*  HGB 17.8* 18.7* 17.0 17.2* 16.3  HCT 52.7* 55.0* 50.0 50.8 49.3  PLT 202  --  176 195 149*  MCV 89.5  --  88.0 89.0 89.8  MCH 30.2  --  29.9 30.1 29.7  MCHC 33.8  --  34.0 33.9 33.1  RDW 13.2  --  12.9 13.2 13.2  LYMPHSABS 1.6  --  0.4* 0.5* 0.5*  MONOABS 0.7  --  0.0* 0.2 0.1  EOSABS 0.1  --  0.0 0.0 0.0  BASOSABS 0.0  --  0.0 0.0 0.0    Recent Labs  Lab 02/08/22 1818 02/08/22 1837 02/10/22 0616 02/11/22 0210 02/12/22 0310 02/13/22 0321  NA 135 138  --  136 140 139  K 3.9 3.9  --  4.3 4.1 4.8  CL 100 102  --  104 105 103  CO2 23  --   --  22 24 25   GLUCOSE 118* 118*  --  231* 165* 178*  BUN 9 11  --  10 12 15   CREATININE 0.85 0.80  --  0.91 0.98 0.90  AST 42*  --   --  40 36 35  ALT 31  --   --  32 32 35  ALKPHOS 49  --   --  51 49 47  BILITOT 1.0  --   --  0.9 0.7 0.7  ALBUMIN 4.1  --   --  3.9 3.9 3.7  CRP  --   --  <0.5 0.5 0.5 <0.5  INR 1.0  --   --   --   --   --   TSH  --   --  9.391*  --   --   --   HGBA1C  --   --  6.2*  --   --   --   BNP  --   --   --  52.9 34.2 433.0*  MG  --   --   --  2.0 2.1 2.4  CALCIUM 9.2  --   --  9.3 9.2 8.7*   ----------------------------------------------------------------------------------------------------------------- No results for input(s): "CHOL", "HDL", "LDLCALC", "TRIG", "CHOLHDL", "LDLDIRECT" in the last 72 hours.   Lab Results   Component Value Date   HGBA1C 6.2 (H) 02/10/2022    No results for input(s): "TSH", "T4TOTAL", "T3FREE", "THYROIDAB" in the last 72 hours.  Invalid input(s): "FREET3"     Radiology Reports CT HEAD WO CONTRAST (5MM)  Result Date: 02/09/2022 CLINICAL DATA:  Follow-up subarachnoid hemorrhage EXAM: CT HEAD WITHOUT CONTRAST TECHNIQUE: Contiguous axial images were obtained from the base of the skull through the vertex without intravenous contrast. RADIATION DOSE REDUCTION: This exam was performed according to the departmental dose-optimization program which includes automated exposure control, adjustment of the mA and/or kV according to patient size and/or use of iterative reconstruction technique. COMPARISON:  CT 02/08/2022 and MR brain 02/09/2022 FINDINGS: Brain: The small focus along the right superior frontal gyrus of suspected subarachnoid hemorrhage appears unchanged from the CT dated 02/08/2022. This measures approximately 1.1 cm on sagittal image 28/6. Previously 1.3 cm. No new areas of hemorrhage identified. No signs of acute stroke. Sulci and ventricular volumes appear normal. Vascular: No hyperdense vessel or unexpected calcification. Skull: Normal. Negative for fracture or focal lesion. Sinuses/Orbits: Chronic left maxillary and ethmoid mucosal thickening. Mastoid air cells are clear. Other: None. IMPRESSION: 1. Stable appearance of small focus of suspected subarachnoid hemorrhage along the right superior frontal gyrus. 2. No new areas of hemorrhage identified. 3. Chronic left maxillary and ethmoid sinus disease. Electronically Signed   By: Kerby Moors M.D.   On: 02/09/2022 18:42   EEG adult  Result Date: 02/09/2022 Landry Corporal, MD     02/09/2022 11:40 AM [facility] Routine EEG Report Video Performed: Performed Demographics: Patient Name:   Isam, Unrein Date of Birth:   1966/01/12 Identification Number:   MRN - [id number] Study  Times: Study Start Time:   02/09/2022 10:18:53 Study End Time:   02/09/2022 11:09:09 Duration:   50 minutes Indication(s): Spells, Eval for Seizures Technical Summary: This EEG was performed utilizing standard International 10-20 System of electrode placement. Data were obtained and interpreted utilizing referential montage recording, with reformatting to longitudinal, transverse bipolar, and referential montages as necessary for interpretation. State(s):      Drowsy      Asleep Activation Procedures: Hyperventilation: Not performed Photic Stimulation: Not performed EEG Description: Awake background of 6 to 7 Hz is occasionally present. Intermittent generalized theta-delta slowing was present. Excess continuous generalized theta-delta slowing was present. No normal sleep architecture is noted. Muscle, motion, electrode, and eye movement artifacts were occasionally noted. Prominent salt bridging versus sweat artifact is noted. Impression: Abnormal EEG due to:   1) Theta range background slowing  2) Intermittent generalized theta-delta slowing  3) Continuous generalized theta-delta slowing Clinical Correlation: This EEG is suggestive of a mild-moderate encephalopathy, but is nonspecific as to etiology. The absence of epileptiform abnormalities does not preclude a clinical diagnosis of seizures. [facility] For Inpatient follow-up with [facility] physician please call [facility] [phone]. This is not an outpatient service. Post hospital discharge, please contact hospital directly.      Signature  -   Lala Lund M.D on 02/13/2022 at 9:18 AM   -  To page go to [email or web address]

## 2022-02-13 NOTE — Progress Notes (Addendum)
Inpatient Rehab Admissions Coordinator:    Work up ongoing, but CIR following for potential admit pending medical readiness, insurance auth, and confirmation of caregiver support.  I reached out to pt.'s sister Pamala Hurry, and she states that she lives in [place] and that Pt.'s brother lives in [place] and that they are not able to provide physical support at d/c. States Pt. Has a strong network of friends, but she is not sure if they can arrange 24/7 assist or not. I placed a call to Sharee Pimple (Pt.'s closest friend who lives locally) to discuss and left a voicemail with request for callback.   Clemens Catholic, [place], [place] Admissions Coordinator  [phone] ([place]) [phone] (office)

## 2022-02-13 NOTE — PMR Pre-admission (Shared)
PMR Admission Coordinator Pre-Admission Assessment  Patient: Steve Richards is an 56 y.o., male MRN: [id number] DOB: Feb 26, 1966 Height: 6' (182.9 cm) Weight: 120 kg  Insurance Information HMO: ***    PPO: ***     PCP: ***     IPA: ***     80/20: ***     OTHER: *** PRIMARY: [employer]       Policy#: ***      Subscriber: *** CM Name: ***      Phone#: ***     Fax#: *** Pre-Cert#: ***      Employer: *** Benefits:  Phone #: ***     Name: *** Eff. Date: ***     Deduct: ***      Out of Pocket Max: ***      Life Max: *** CIR: ***      SNF: *** Outpatient: ***     Co-Pay: *** Home Health: ***      Co-Pay: *** DME: ***     Co-Pay: *** Providers: *** SECONDARY:       Policy#:      Phone#:   Financial Counselor:       Phone#:   The "Data Collection Information Summary" for patients in Inpatient Rehabilitation Facilities with attached "Privacy Act [place] Records" was provided and verbally reviewed with: Patient  Emergency Contact Information Contact Information     Name Relation Home Work Mobile   Hise,Barbara Sister   [phone]   Ree Shay [phone]  [phone]   Shearon Stalls [phone]  [phone]   Moore,William Other [phone]     Teola Bradley Friend   [phone]   Lazaro Arms   [phone]       Current Medical History  Patient Admitting Diagnosis: CVA History of Present Illness:  Steve Richards is a 56 y.o. male with medical history significant of chronic combined CHF; DM; hypothyroidism; cirrhosis with ascites; and h/o AVR presenting with  progressive neurologic symptoms.  He was seen at urgent care for diplopia on 11/27 and he was referred to ophthalmology and neurology as well as his PCP.  He came to the ED with the same issue on 11/28 and had a negative MRI, was referred to ENT and ophthalmology.   He has continued to have diplopia with R hemianopsia as well as ataxia, aphasia, and dyscoordination.   *** Pt.  Seen by PT, OT, and SLP and they recommend CIR to assist return to PLOF.  Complete NIHSS TOTAL: 4  Patient's medical record from [facility] has been reviewed by the rehabilitation admission coordinator and physician.  Past Medical History  Past Medical History:  Diagnosis Date   Ascites 07/15/2019   Cellulitis and abscess of left lower extremity 07/21/2019   Chronic combined systolic (congestive) and diastolic (congestive) heart failure (HCC)    Cirrhosis of liver with ascites (HCC)    Hepatic cirrhosis ([place]) 07/15/2019   Hyperglycemia 07/21/2019   Left kidney mass 07/15/2019   Morbid obesity ([place])    Pleural effusion on right 07/15/2019   S/P aortic valve replacement with bioprosthetic valve 08/06/2019   Size 25 mm // Echocardiogram 8/21: EF 55-60, mod LVH, Gr 2 DD, normal RVSF, mod LAE, trivial MR, AVR w mean 13 mmHg and no PVL   Splenomegaly 00/76/2263   Systolic ejection murmur 33/54/5625    Has the patient had major surgery during 100 days prior to admission? {Yes/No/Unknown:304600602}  Family History   family history includes Osteoporosis in his mother; Other in his  father.  Current Medications  Current Facility-Administered Medications:    acetaminophen (TYLENOL) tablet 650 mg, 650 mg, Oral, Q4H PRN **OR** acetaminophen (TYLENOL) 160 MG/5ML solution 650 mg, 650 mg, Per Tube, Q4H PRN **OR** acetaminophen (TYLENOL) suppository 650 mg, 650 mg, Rectal, Q4H PRN, Karmen Bongo, MD   docusate sodium (COLACE) capsule 200 mg, 200 mg, Oral, BID, Lala Lund K, MD, 200 mg at 02/13/22 0820   insulin aspart (novoLOG) injection 0-15 Units, 0-15 Units, Subcutaneous, TID WC, Karmen Bongo, MD, 3 Units at 02/13/22 8338   insulin aspart (novoLOG) injection 0-5 Units, 0-5 Units, Subcutaneous, QHS, Karmen Bongo, MD, 4 Units at 02/11/22 2133   levothyroxine (SYNTHROID) tablet 100 mcg, 100 mcg, Oral, Daily, Lala Lund K, MD, 100 mcg at 02/13/22 0820    methylPREDNISolone sodium succinate (SOLU-MEDROL) 1,000 mg in sodium chloride 0.9 % 50 mL IVPB, 1,000 mg, Intravenous, Q24H, de Yolanda Manges, [place] E, NP, Last Rate: 66 mL/hr at 02/12/22 1623, 1,000 mg at 02/12/22 1623   multivitamin with minerals tablet 1 tablet, 1 tablet, Oral, Daily, Thurnell Lose, MD, 1 tablet at 02/13/22 0820   pantoprazole (PROTONIX) EC tablet 40 mg, 40 mg, Oral, Daily, de [place] Torre, [place] E, NP, 40 mg at 02/13/22 0820   polyethylene glycol (MIRALAX / GLYCOLAX) packet 17 g, 17 g, Oral, BID, Thurnell Lose, MD, 17 g at 02/13/22 0820   senna-docusate (Senokot-S) tablet 1 tablet, 1 tablet, Oral, QHS PRN, Karmen Bongo, MD  Patients Current Diet:  Diet Order             Diet regular Room service appropriate? Yes with Assist; Fluid consistency: Thin  Diet effective now                   Precautions / Restrictions Precautions Precautions: Fall Precaution Comments: pt denies falls Restrictions Weight Bearing Restrictions: No   Has the patient had 2 or more falls or a fall with injury in the past year? {Yes/No/Unknown:304600602}  Prior Activity Level Community (5-7x/wk): Pt. active in the community PTA  Prior Functional Level Self Care: Did the patient need help bathing, dressing, using the toilet or eating? {Prior Functional SNKNL:976734193}  Indoor Mobility: Did the patient need assistance with walking from room to room (with or without device)? {Prior Functional XTKWI:097353299}  Stairs: Did the patient need assistance with internal or external stairs (with or without device)? {Prior Functional MEQAS:341962229}  Functional Cognition: Did the patient need help planning regular tasks such as shopping or remembering to take medications? {Prior Functional NLGXQ:119417408}  Patient Information Are you of Hispanic, Latino/a,or Spanish origin?: A. No, not of Hispanic, Latino/a, or Spanish origin What is your race?: A. White Do you need or want an  interpreter to communicate with a doctor or health care staff?: 0. No  Patient's Response To:  Health Literacy and Transportation Is the patient able to respond to health literacy and transportation needs?: Yes Health Literacy - How often do you need to have someone help you when you read instructions, pamphlets, or other written material from your doctor or pharmacy?: Never In the past 12 months, has lack of transportation kept you from medical appointments or from getting medications?: No In the past 12 months, has lack of transportation kept you from meetings, work, or from getting things needed for daily living?: No  Development worker, international aid / [place] Devices/Equipment: None Home Equipment: None  Prior Device Use: Indicate devices/aids used by the patient prior to current illness, exacerbation or  injury? {Prior Device AJGO:115726203}  Current Functional Level Cognition  Arousal/Alertness: Awake/alert Overall Cognitive Status: Impaired/Different from baseline Orientation Level: Oriented X4 Safety/Judgement: Decreased awareness of safety, Decreased awareness of deficits General Comments: pt reported no issues with his balance prior to exam (significant decr balance noted with gait)    Extremity Assessment (includes Sensation/Coordination)  Upper Extremity Assessment: Defer to OT evaluation RUE Deficits / Details: tremors more to his hands, strength is functional. RUE Sensation: WNL RUE Coordination: decreased fine motor LUE Deficits / Details: tremors more to his hands, strength is functional. LUE Sensation: WNL LUE Coordination: decreased fine motor  Lower Extremity Assessment: RLE deficits/detail, LLE deficits/detail RLE Deficits / Details: strength 5/5 throughout; ataxic movements RLE worse than LLE RLE Coordination: decreased fine motor, decreased gross motor (decr RAM; heel to shin) LLE Deficits / Details: strength 5/5 throughout; ataxic movements RLE worse  than LLE LLE Coordination: decreased fine motor, decreased gross motor (decr RAM; heel to shin)    ADLs  Overall ADL's : Needs assistance/impaired Eating/Feeding: Set up, Sitting Grooming: Wash/dry hands, Min guard, Standing Upper Body Bathing: Set up, Sitting Lower Body Bathing: Moderate assistance, Sit to/from stand Upper Body Dressing : Set up, Sitting Lower Body Dressing: Moderate assistance, Sit to/from stand Toilet Transfer: Minimal assistance, Regular Toilet, Ambulation Toileting- Clothing Manipulation and Hygiene: Supervision/safety, Sitting/lateral lean    Mobility  Overal bed mobility: Needs Assistance Bed Mobility: Supine to Sit Supine to sit: Supervision    Transfers  Overall transfer level: Needs assistance Equipment used: None Transfers: Sit to/from Stand Sit to Stand: Min assist Bed to/from chair/wheelchair/BSC transfer type:: Step pivot Step pivot transfers: Min assist General transfer comment: wide BOS; unsteady on initial stadning requiring min assist    Ambulation / Gait / Stairs / Wheelchair Mobility  Ambulation/Gait Ambulation/Gait assistance: Mod assist Gait Distance (Feet): 160 Feet Assistive device: None Gait Pattern/deviations: Step-through pattern, Decreased stride length, Shuffle, Ataxic, Staggering left, Wide base of support General Gait Details: requires cues due to decr vision on rt; staggering LOB to his left x 2 requiring mod assist to recover; may do better with RW Gait velocity interpretation: 1.31 - 2.62 ft/sec, indicative of limited community ambulator    Posture / Balance Balance Overall balance assessment: Needs assistance Sitting-balance support: Feet supported Sitting balance-Leahy Scale: Fair Standing balance support: Single extremity supported Standing balance-Leahy Scale: Poor    Special needs/care consideration {Special Care Needs/Care Considerations:304600603}   Previous Home Environment (from acute therapy  documentation) Living Arrangements: Alone  Lives With: Alone Available Help at Discharge: Available PRN/intermittently, Friend(s) Type of Home: Apartment Home Layout: One level Home Access: Stairs to enter Entrance Stairs-Rails: Left, Right Entrance Stairs-Number of Steps: 3 in the front with no rails and 7 in the back with bilateral rails Bathroom Shower/Tub: Chiropodist: Standard Bathroom Accessibility: Yes How Accessible: Accessible via walker [place]: No  Discharge Living Setting Plans for Discharge Living Setting: Patient's home Type of Home at Discharge: House Discharge Home Layout: One level Discharge Home Access: Stairs to enter Entrance Stairs-Rails: None Entrance Stairs-Number of Steps: 3 om front, no rail Discharge Bathroom Shower/Tub: Tub/shower unit Discharge Bathroom Toilet: Standard Discharge Bathroom Accessibility: Yes How Accessible: Accessible via walker Does the patient have any problems obtaining your medications?: No  Social/Family/Support Systems Patient Roles: Other (Comment) Contact Information: [phone] Anticipated Caregiver: Miliano Cotten Caregiver Availability: 24/7  Goals Patient/Family Goal for Rehab: PT/OT/SLP Supervision Expected length of stay: 14-16 days Pt/Family Agrees to Admission and willing to participate:  Yes Program Orientation Provided & Reviewed with Pt/Caregiver Including Roles  & Responsibilities: No  Decrease burden of Care through IP rehab admission: {Inpatient Rehab Care:20780}  Possible need for SNF placement upon discharge: ***  Patient Condition: {PATIENT'S CONDITION:22832}  Preadmission Screen Completed By:  Genella Mech, 02/13/2022 9:03 AM ______________________________________________________________________   Discussed status with Dr. Marland Kitchen on *** at *** and received approval for admission today.  Admission Coordinator:  Genella Mech, CCC-SLP, time Marland KitchenSudie Grumbling ***    Assessment/Plan: Diagnosis: Does the need for close, 24 hr/day Medical supervision in concert with the patient's rehab needs make it unreasonable for this patient to be served in a less intensive setting? {yes_no_potentially:3041433} Co-Morbidities requiring supervision/potential complications: *** Due to {due HF:2902111}, does the patient require 24 hr/day rehab nursing? {yes_no_potentially:3041433} Does the patient require coordinated care of a physician, rehab nurse, PT, OT, and SLP to address physical and functional deficits in the context of the above medical diagnosis(es)? {yes_no_potentially:3041433} Addressing deficits in the following areas: {deficits:3041436} Can the patient actively participate in an intensive therapy program of at least 3 hrs of therapy 5 days a week? {yes_no_potentially:3041433} The potential for patient to make measurable gains while on inpatient rehab is {potential:3041437} Anticipated functional outcomes upon discharge from inpatient rehab: {functional outcomes:304600100} PT, {functional outcomes:304600100} OT, {functional outcomes:304600100} SLP Estimated rehab length of stay to reach the above functional goals is: *** Anticipated discharge destination: {anticipated dc setting:21604} 10. Overall Rehab/Functional Prognosis: {potential:3041437}   MD Signature: ***

## 2022-02-13 NOTE — Progress Notes (Signed)
CSW attempted to speak with patient about the discharge plan. Patient stated he would not engage in conversation about options until after Thursday despite CSW attempts to explain that planning is a process. Will follow up after Thursday.  Gilmore Laroche, MSW, [facility]

## 2022-02-13 NOTE — Progress Notes (Signed)
Physical Therapy Treatment Patient Details Name: Steve Richards MRN: [id number] DOB: 09-25-1965 Today's Date: 02/13/2022   History of Present Illness 56 y.o. male adm 12/7 with PMH significant for hepatic cirrhosis, morbid obesity, aortic valve replacement with bioprosthetic valve, who presentd with word finding difficulty, tremor and vision abnormalities.  MRI: Small 14 mm focus suspicious for subarachnoid hemorrhage at the right vertex adjacent to the falx, "largely unremarkable MRI" Per neuro consult, "Differential includes autoimmune, paraneoplastic etiology vs could be CJD or infectious."    PT Comments    Patient resting in recliner and agreeable to PT session. Pt noted to have moderate tremor Rt>Lt UE at rest and during mobility. Pt completed repeat sit<>stand this session for LE strengthening. Cues at start for hand placement and technique. Pt improved power up with repetition and required slightly decreased assist each time. Short bout of gait completed in room with seated rest halfway, pt drifting Rt and dues needed to improve walker management and prevent LOB.   Recommendations for follow up therapy are one component of a multi-disciplinary discharge planning process, led by the attending physician.  Recommendations may be updated based on patient status, additional functional criteria and insurance authorization.  Follow Up Recommendations  Acute inpatient rehab (3hours/day)     Assistance Recommended at Discharge Frequent or constant Supervision/Assistance  Patient can return home with the following A lot of help with walking and/or transfers;Assistance with cooking/housework;Assist for transportation;Help with stairs or ramp for entrance;A lot of help with bathing/dressing/bathroom   Equipment Recommendations  Rolling walker (2 wheels)    Recommendations for Other Services       Precautions / Restrictions Precautions Precautions: Fall Precaution Comments: pt denies  falls Restrictions Weight Bearing Restrictions: No     Mobility  Bed Mobility               General bed mobility comments: pt OOB in recliner    Transfers Overall transfer level: Needs assistance Equipment used: Rolling walker (2 wheels) Transfers: Sit to/from Stand Sit to Stand: Min assist           General transfer comment: cues for hand placement, assist to bring Rt hand to walker. Min assist to power up from recliner. repeat sit<>stands completed and pt required decreased assist each time. Min guard for power up from EOB with min assist for set up/hand placement, pt able to scoot anteriorly for positioning.    Ambulation/Gait Ambulation/Gait assistance: Mod assist Gait Distance (Feet): 20 Feet Assistive device: Rolling walker (2 wheels) Gait Pattern/deviations: Step-through pattern, Decreased stride length, Shuffle, Ataxic, Staggering left, Wide base of support Gait velocity: decr     General Gait Details: patient drifting Rt and cues needed to scan due to impaired vision on the Rt. mod assist to prevent LOB 1x during both bouts of gait in room. pt amb from chair around bed and back with seated rest between.   Stairs             Wheelchair Mobility    Modified Rankin (Stroke Patients Only)       Balance Overall balance assessment: Needs assistance Sitting-balance support: Feet supported Sitting balance-Leahy Scale: Fair   Postural control: Posterior lean Standing balance support: Bilateral upper extremity supported Standing balance-Leahy Scale: Poor Standing balance comment: reliant on RW for balance                            Cognition Arousal/Alertness: Awake/alert Behavior During Therapy: Flat  affect Overall Cognitive Status: Impaired/Different from baseline Area of Impairment: Problem solving, Safety/judgement, Awareness                         Safety/Judgement: Decreased awareness of safety, Decreased awareness of  deficits Awareness: Intellectual Problem Solving: Slow processing, Difficulty sequencing General Comments: difficulty with word finding and answering questions. pt emotional today and tearful about current function and impairments.        Exercises      General Comments General comments (skin integrity, edema, etc.): at end of session SpO2 98 on 2 liters, HR 77, BP 151/92      Pertinent Vitals/Pain Pain Assessment Pain Assessment: No/denies pain    Home Living                          Prior Function            PT Goals (current goals can now be found in the care plan section) Acute Rehab PT Goals Patient Stated Goal: find out what is causing problems PT Goal Formulation: With patient Time For Goal Achievement: 03/04/2022 Potential to Achieve Goals: Good Progress towards PT goals: Progressing toward goals    Frequency    Min 3X/week      PT Plan Current plan remains appropriate    Co-evaluation              AM-PAC PT "6 Clicks" Mobility   Outcome Measure  Help needed turning from your back to your side while in a flat bed without using bedrails?: None Help needed moving from lying on your back to sitting on the side of a flat bed without using bedrails?: None Help needed moving to and from a bed to a chair (including a wheelchair)?: A Little Help needed standing up from a chair using your arms (e.g., wheelchair or bedside chair)?: A Little Help needed to walk in hospital room?: A Lot Help needed climbing 3-5 steps with a railing? : Total 6 Click Score: 17    End of Session Equipment Utilized During Treatment: Gait belt Activity Tolerance: Patient tolerated treatment well Patient left: in chair;with call bell/phone within reach;with chair alarm set Nurse Communication: Mobility status PT Visit Diagnosis: Unsteadiness on feet (R26.81);Other abnormalities of gait and mobility (R26.89);Ataxic gait (R26.0)     Time: 8887-5797 PT Time  Calculation (min) (ACUTE ONLY): 42 min  Charges:  $Gait Training: 8-22 mins $Therapeutic Activity: 8-22 mins                     Verner Mould, DPT Acute Rehabilitation Services Office [phone]  02/13/22 12:07 PM

## 2022-02-13 NOTE — Progress Notes (Signed)
NEUROLOGY PROGRESS NOTE   Date of service: February 13, 2022 Patient Name: Steve Richards MRN:  [id number] DOB:  04/29/1965  Brief HPI  Steve Richards is a 56 y.o. male with a PMHx significant for cirrhosis, obesity and aortic valve replacement with bioprosthetic valve who presented with expressive aphasia, right sided tremor and vision changes. Patient states several months ago, he began having cramping and weakness in his left hand. He was told he had a pinched nerve and was directed to get an EMG, which he did not follow-up on. Around 01/18/22, he noticed decreased coordination in both his right arm and leg. Pt also states that his vision is blurry and that objects appear to jump around.  MRI brain is concerning for cortical ribboning on DWI, small 13m focus [facility] negative for infection on initial labs, steroids started. Undergoing CJD and autoimmune workup.  CT angio pending.    Interval Hx   MRI with concerning cortical ribboning on DWI. Pending CT angio. LP negative for bacterial or viral infection with WBC 1, but elevated protein of 69. Autominnue and RVELFYBlabs pending as part of CJD work-up. On steroids. PT/OT/Speech consulted.   Vitals   Vitals:   02/12/22 0801 02/12/22 1132 02/12/22 1627 02/13/22 1204  BP: (!) 140/85 (!) 143/89 133/85 (!) 137/93  Pulse: 69 84 80 88  Resp: 16 13 19 18   Temp: 97.7 F (36.5 C) 97.6 F (36.4 C) 98 F (36.7 C) 98.5 F (36.9 C)  TempSrc: Oral Oral Oral Oral  SpO2: 96% 93% 99%   Weight:      Height:         Body mass index is 35.88 kg/m.  Physical Exam   General: Sitting in chair; in no acute distress.  Resp: unlabored breathing   Neurologic Examination  Mental status/Cognition: Alert to self, age, and being in hospital. Unable to state correct city. Also asked for questions to be repeated saying, "I lost that one. Can you repeat it?". Able to follow simple commands with encouragement and repetition.  Speech/language:  Dysarthria present, nonfluent, some word finding difficulties.  Cranial nerves:   CN II Pupils equal and reactive to light. Unable to truly assess visual fields, inconsistent responses from patient.   CN III,IV,VI EOM intact, no gaze preference or deviation, no nystagmus    CN V normal sensation in V1, V2, and V3 segments bilaterally    CN VII no asymmetry, no nasolabial fold flattening    CN VIII normal hearing to speech    CN IX & X normal palatal elevation, no uvular deviation    CN XI 5/5 head turn and 5/5 shoulder shrug bilaterally    CN XII midline tongue protrusion    Motor: Frequent tremors seen in bilateral extremities, with the right side being more prominent. Able to move all extremities with 4/5 strength. Slight drift seen in RUE.   Sensation: Intact to light touch bilaterally  Coordination/Complex Motor:  Finger to Nose: intact with noted tremors bilaterally  Gait: deferred.   Labs   Basic Metabolic Panel:  Lab Results  Component Value Date   NA 139 02/13/2022   K 4.8 02/13/2022   CO2 25 02/13/2022   GLUCOSE 178 (H) 02/13/2022   BUN 15 02/13/2022   CREATININE 0.90 02/13/2022   CALCIUM 8.7 (L) 02/13/2022   GFRNONAA >60 02/13/2022   GFRAA 115 04/18/2020   HbA1c:  Lab Results  Component Value Date   HGBA1C 6.2 (H) 02/10/2022   LDL:  Lab Results  Component Value Date   LDLCALC 107 (H) 02/10/2022   Urine Drug Screen:     Component Value Date/Time   LABOPIA NONE DETECTED 07/21/2019 1555   COCAINSCRNUR Negative 01/31/2022 1414   LABBENZ NONE DETECTED 07/21/2019 1555   AMPHETMU NONE DETECTED 07/21/2019 1555   THCU NONE DETECTED 07/21/2019 1555   LABBARB NONE DETECTED 07/21/2019 1555    Alcohol Level     Component Value Date/Time   ETH <10 02/08/2022 1818   Meningitis/encephalitis panel: Negative CSF glucose 76 CSF protein 69 CSF red cell count 390 in tube 1 and tube 4 CSF white cell count 2 in tube 1, 1 and tube 4 Oligoclonal bands pending IgG  index pending Lyme PCR pending VDRL CSF pending Protein 14-3-3 and T-tau pending CSF ACE pending CSF culture pending Thyroglobulin antibody pending Ds DNA Ab negative ANA Ab negative Autoimmune encephalitis panel pending TSH 9.391 Beta Isoform pending  Imaging and Diagnostic studies  Results for orders placed during the hospital encounter of 02/08/22  CT HEAD WO CONTRAST (5MM)  Narrative CLINICAL DATA:  Follow-up subarachnoid hemorrhage  EXAM: CT HEAD WITHOUT CONTRAST  TECHNIQUE: Contiguous axial images were obtained from the base of the skull through the vertex without intravenous contrast.  RADIATION DOSE REDUCTION: This exam was performed according to the departmental dose-optimization program which includes automated exposure control, adjustment of the mA and/or kV according to patient size and/or use of iterative reconstruction technique.  COMPARISON:  CT 02/08/2022 and MR brain 02/09/2022  FINDINGS: Brain: The small focus along the right superior frontal gyrus of suspected subarachnoid hemorrhage appears unchanged from the CT dated 02/08/2022. This measures approximately 1.1 cm on sagittal image 28/6. Previously 1.3 cm. No new areas of hemorrhage identified. No signs of acute stroke. Sulci and ventricular volumes appear normal.  Vascular: No hyperdense vessel or unexpected calcification.  Skull: Normal. Negative for fracture or focal lesion.  Sinuses/Orbits: Chronic left maxillary and ethmoid mucosal thickening. Mastoid air cells are clear.  Other: None.  IMPRESSION: 1. Stable appearance of small focus of suspected subarachnoid hemorrhage along the right superior frontal gyrus. 2. No new areas of hemorrhage identified. 3. Chronic left maxillary and ethmoid sinus disease.   Electronically Signed By: Kerby Moors M.D. On: 02/09/2022 18:42   Impression   MRI brain is concerning for cortical ribboning, raising the question of CJD. LP negative for  infection, RT-quic is pending. Meningitis/encephalitis panel is negative, and EEG reveals no seizure activity.   - Possible autoimmune process versus CJD causing right-sided tremor, expressive aphasia, and ataxia. Awaiting further test results.   Recommendations  Await RT-quic and other CSF study results Continue Solu-Medrol 1000 mg daily x 5 doses along with PPI. Last dose will be on Wednesday.  [facility] d/t small SAH seen on MRI CTangio for further work-up Repeat MRI after steroids completed ______________________________________________________________________   Thank you for the opportunity to take part in the care of this patient. If you have any further questions, please contact the neurology consultation attending.  Hospital day # 5   Patient seen and examined by NP/APP with MD. MD to update note as needed.    Vivi Ferns, DNP, AGACNP-BC [facility] Please see AMION for pager information  Electronically signed: Dr. Kerney Elbe

## 2022-02-13 NOTE — Progress Notes (Signed)
Occupational Therapy Treatment Patient Details Name: Steve Richards MRN: [id number] DOB: 08-13-1965 Today's Date: 02/13/2022   History of present illness 56 y.o. male adm 12/7 with PMH significant for hepatic cirrhosis, morbid obesity, aortic valve replacement with bioprosthetic valve, who presentd with word finding difficulty, tremor and vision abnormalities.  MRI: Small 14 mm focus suspicious for subarachnoid hemorrhage at the right vertex adjacent to the falx, "largely unremarkable MRI" Per neuro consult, "Differential includes autoimmune, paraneoplastic etiology vs could be CJD or infectious."   OT comments  Patient received in supine and agreeable to OT session. Patient on 2 liters of O2 with SpO2 at 98.  Patient required cues and min assist. Patient stood from EOB and demonstrated unsafe balance and transferred to recliner to perform grooming and LB dressing. Patient states he believes his vision is worsening and that he now needs help with feeding due to tremors and vision. Patient would benefit from further OT services to increase performance with self care tasks.    Recommendations for follow up therapy are one component of a multi-disciplinary discharge planning process, led by the attending physician.  Recommendations may be updated based on patient status, additional functional criteria and insurance authorization.    Follow Up Recommendations  Acute inpatient rehab (3hours/day)     Assistance Recommended at Discharge Frequent or constant Supervision/Assistance  Patient can return home with the following  Assist for transportation;Assistance with cooking/housework;Help with stairs or ramp for entrance;A lot of help with walking and/or transfers;A lot of help with bathing/dressing/bathroom   Equipment Recommendations  Tub/shower bench    Recommendations for Other Services      Precautions / Restrictions Precautions Precautions: Fall Precaution Comments: pt denies  falls Restrictions Weight Bearing Restrictions: No       Mobility Bed Mobility Overal bed mobility: Needs Assistance Bed Mobility: Supine to Sit     Supine to sit: Min assist     General bed mobility comments: verbal cues due to vision and assistance with trunk    Transfers Overall transfer level: Needs assistance Equipment used: Rolling walker (2 wheels) Transfers: Sit to/from Stand, Bed to chair/wheelchair/BSC Sit to Stand: Min assist     Step pivot transfers: Min assist     General transfer comment: unsteady and required increased time before attempting transfers     Balance Overall balance assessment: Needs assistance Sitting-balance support: Feet supported Sitting balance-Leahy Scale: Fair   Postural control: Posterior lean Standing balance support: Bilateral upper extremity supported Standing balance-Leahy Scale: Poor Standing balance comment: reliant on RW for balance                           ADL either performed or assessed with clinical judgement   ADL Overall ADL's : Needs assistance/impaired     Grooming: Wash/dry hands;Wash/dry face;Supervision/safety;Sitting Grooming Details (indicate cue type and reason): in recliner             Lower Body Dressing: Moderate assistance;Sit to/from stand Lower Body Dressing Details (indicate cue type and reason): able to doff socks but required mod assist to [facility] Transfer: Minimal assistance;Rolling walker (2 wheels) Toilet Transfer Details (indicate cue type and reason): simulated to recliner           General ADL Comments: unsteady balance when standing, performed self care tasks seated for safety    Extremity/Trunk Assessment Upper Extremity Assessment RUE Deficits / Details: tremors more to his hands, strength is functional. RUE Sensation: WNL RUE Coordination:  decreased fine motor LUE Deficits / Details: tremors more to his hands, strength is functional. LUE Sensation:  WNL LUE Coordination: decreased fine motor            Vision   Visual Fields: Right homonymous hemianopsia Depth Perception: Overshoots;Undershoots   Perception     Praxis      Cognition Arousal/Alertness: Awake/alert Behavior During Therapy: Flat affect Overall Cognitive Status: Impaired/Different from baseline Area of Impairment: Problem solving, Safety/judgement, Awareness                         Safety/Judgement: Decreased awareness of safety, Decreased awareness of deficits Awareness: Intellectual Problem Solving: Slow processing, Difficulty sequencing General Comments: difficulty with word finding and answering questions        Exercises      Shoulder Instructions       General Comments at end of session SpO2 98 on 2 liters, HR 77, BP 151/92    Pertinent Vitals/ Pain       Pain Assessment Pain Assessment: No/denies pain  Home Living                                          Prior Functioning/Environment              Frequency  Min 2X/week        Progress Toward Goals  OT Goals(current goals can now be found in the care plan section)  Progress towards OT goals: Progressing toward goals  Acute Rehab OT Goals Patient Stated Goal: get better OT Goal Formulation: With patient Time For Goal Achievement: 02/23/22 Potential to Achieve Goals: Good ADL Goals Pt Will Perform Grooming: with supervision;standing Pt Will Perform Lower Body Dressing: with supervision;sit to/from stand Pt Will Transfer to Toilet: with supervision;ambulating;regular height toilet  Plan Discharge plan remains appropriate    Co-evaluation                 AM-PAC OT "6 Clicks" Daily Activity     Outcome Measure   Help from another person eating meals?: A Lot Help from another person taking care of personal grooming?: A Little Help from another person toileting, which includes using toliet, bedpan, or urinal?: A Little Help from  another person bathing (including washing, rinsing, drying)?: A Lot Help from another person to put on and taking off regular upper body clothing?: A Little Help from another person to put on and taking off regular lower body clothing?: A Lot 6 Click Score: 15    End of Session Equipment Utilized During Treatment: Gait belt;Oxygen  OT Visit Diagnosis: Unsteadiness on feet (R26.81);Ataxia, unspecified (R27.0);Low vision, both eyes (H54.2)   Activity Tolerance Patient tolerated treatment well   Patient Left in chair;with call bell/phone within reach;with chair alarm set   Nurse Communication Mobility status        Time: 4765-4650 OT Time Calculation (min): 28 min  Charges: OT General Charges $OT Visit: 1 Visit OT Treatments $Self Care/Home Management : 8-22 mins  Lodema Hong, [place]  Office [place] 02/13/2022, 2:25 PM

## 2022-02-14 ENCOUNTER — Inpatient Hospital Stay (HOSPITAL_COMMUNITY): Payer: Commercial Managed Care - HMO

## 2022-02-14 DIAGNOSIS — H5347 Heteronymous bilateral field defects: Secondary | ICD-10-CM | POA: Diagnosis not present

## 2022-02-14 LAB — CBC WITH DIFFERENTIAL/PLATELET
Abs Immature Granulocytes: 0.05 10*3/uL (ref 0.00–0.07)
Basophils Absolute: 0 10*3/uL (ref 0.0–0.1)
Basophils Relative: 0 %
Eosinophils Absolute: 0 10*3/uL (ref 0.0–0.5)
Eosinophils Relative: 0 %
HCT: 51 % (ref 39.0–52.0)
Hemoglobin: 16.6 g/dL (ref 13.0–17.0)
Immature Granulocytes: 1 %
Lymphocytes Relative: 4 %
Lymphs Abs: 0.3 10*3/uL — ABNORMAL LOW (ref 0.7–4.0)
MCH: 29.1 pg (ref 26.0–34.0)
MCHC: 32.5 g/dL (ref 30.0–36.0)
MCV: 89.3 fL (ref 80.0–100.0)
Monocytes Absolute: 0.1 10*3/uL (ref 0.1–1.0)
Monocytes Relative: 2 %
Neutro Abs: 7.7 10*3/uL (ref 1.7–7.7)
Neutrophils Relative %: 93 %
Platelets: 158 10*3/uL (ref 150–400)
RBC: 5.71 MIL/uL (ref 4.22–5.81)
RDW: 13.3 % (ref 11.5–15.5)
WBC: 8.2 10*3/uL (ref 4.0–10.5)
nRBC: 0 % (ref 0.0–0.2)

## 2022-02-14 LAB — BRAIN NATRIURETIC PEPTIDE: B Natriuretic Peptide: 187.6 pg/mL — ABNORMAL HIGH (ref 0.0–100.0)

## 2022-02-14 LAB — COMPREHENSIVE METABOLIC PANEL
ALT: 37 U/L (ref 0–44)
AST: 32 U/L (ref 15–41)
Albumin: 3.8 g/dL (ref 3.5–5.0)
Alkaline Phosphatase: 47 U/L (ref 38–126)
Anion gap: 10 (ref 5–15)
BUN: 14 mg/dL (ref 6–20)
CO2: 25 mmol/L (ref 22–32)
Calcium: 8.5 mg/dL — ABNORMAL LOW (ref 8.9–10.3)
Chloride: 102 mmol/L (ref 98–111)
Creatinine, Ser: 0.91 mg/dL (ref 0.61–1.24)
GFR, Estimated: >60 mL/min (ref >60)
Glucose, Bld: 177 mg/dL — ABNORMAL HIGH (ref 70–99)
Potassium: 3.8 mmol/L (ref 3.5–5.1)
Sodium: 137 mmol/L (ref 135–145)
Total Bilirubin: 0.9 mg/dL (ref 0.3–1.2)
Total Protein: 6.4 g/dL — ABNORMAL LOW (ref 6.5–8.1)

## 2022-02-14 LAB — ANGIOTENSIN CONVERTING ENZYME, CSF: Angio Convert Enzyme: <1.5 U/L (ref 0.0–3.1)

## 2022-02-14 LAB — GLUCOSE, CAPILLARY
Glucose-Capillary: 169 mg/dL — ABNORMAL HIGH (ref 70–99)
Glucose-Capillary: 193 mg/dL — ABNORMAL HIGH (ref 70–99)
Glucose-Capillary: 227 mg/dL — ABNORMAL HIGH (ref 70–99)
Glucose-Capillary: 321 mg/dL — ABNORMAL HIGH (ref 70–99)

## 2022-02-14 LAB — OLIGOCLONAL BANDS, CSF + SERM

## 2022-02-14 LAB — THYROGLOBULIN ANTIBODY: Thyroglobulin Antibody: 3.5 IU/mL — ABNORMAL HIGH (ref 0.0–0.9)

## 2022-02-14 LAB — C-REACTIVE PROTEIN: CRP: <0.5 mg/dL (ref <1.0)

## 2022-02-14 LAB — MAGNESIUM: Magnesium: 2.2 mg/dL (ref 1.7–2.4)

## 2022-02-14 MED ORDER — IOHEXOL 350 MG/ML SOLN
75.0000 mL | Freq: Once | INTRAVENOUS | Status: AC | PRN
  Administered 2022-02-14: 75 mL via INTRAVENOUS

## 2022-02-14 NOTE — Plan of Care (Signed)
  Problem: Education: Goal: Ability to describe self-care measures that may prevent or decrease complications (Diabetes Survival Skills Education) will improve Outcome: Progressing Goal: Individualized Educational Video(s) Outcome: Progressing   Problem: Coping: Goal: Ability to adjust to condition or change in health will improve Outcome: Progressing   Problem: Fluid Volume: Goal: Ability to maintain a balanced intake and output will improve Outcome: Progressing   Problem: Health Behavior/Discharge Planning: Goal: Ability to identify and utilize available resources and services will improve Outcome: Progressing Goal: Ability to manage health-related needs will improve Outcome: Progressing   Problem: Skin Integrity: Goal: Risk for impaired skin integrity will decrease Outcome: Progressing

## 2022-02-14 NOTE — Progress Notes (Signed)
Patient walking with mobility tech and had a mechanical fall in hallway. Mobility tech stated the "Legs got tripped up...going a little too fast." Patient landed on left side of body with point of impact. MD notified of incident. Nursing staff assisted patient back to bed. When pt was assessed, there were no new complaints of pain or injury at this time per patient. Patient alert and oriented while feeling "very sorry" for incident.

## 2022-02-14 NOTE — Plan of Care (Signed)
Problem: Education: Goal: Ability to describe self-care measures that may prevent or decrease complications (Diabetes Survival Skills Education) will improve Outcome: Progressing Goal: Individualized Educational Video(s) Outcome: Progressing   Problem: Coping: Goal: Ability to adjust to condition or change in health will improve Outcome: Progressing   Problem: Fluid Volume: Goal: Ability to maintain a balanced intake and output will improve Outcome: Progressing   Problem: Health Behavior/Discharge Planning: Goal: Ability to identify and utilize available resources and services will improve Outcome: Progressing Goal: Ability to manage health-related needs will improve Outcome: Progressing   Problem: Metabolic: Goal: Ability to maintain appropriate glucose levels will improve Outcome: Progressing   Problem: Nutritional: Goal: Maintenance of adequate nutrition will improve Outcome: Progressing Goal: Progress toward achieving an optimal weight will improve Outcome: Progressing   Problem: Skin Integrity: Goal: Risk for impaired skin integrity will decrease Outcome: Progressing   Problem: Tissue Perfusion: Goal: Adequacy of tissue perfusion will improve Outcome: Progressing   Problem: Education: Goal: Knowledge of disease or condition will improve Outcome: Progressing Goal: Knowledge of secondary prevention will improve (MUST DOCUMENT ALL) Outcome: Progressing Goal: Knowledge of patient specific risk factors will improve [place] N/A or DELETE if not current risk factor) Outcome: Progressing   Problem: Ischemic Stroke/TIA Tissue Perfusion: Goal: Complications of ischemic stroke/TIA will be minimized Outcome: Progressing   Problem: Coping: Goal: Will verbalize positive feelings about self Outcome: Progressing Goal: Will identify appropriate support needs Outcome: Progressing   Problem: Health Behavior/Discharge Planning: Goal: Ability to manage health-related needs  will improve Outcome: Progressing Goal: Goals will be collaboratively established with patient/family Outcome: Progressing   Problem: Self-Care: Goal: Ability to participate in self-care as condition permits will improve Outcome: Progressing Goal: Verbalization of feelings and concerns over difficulty with self-care will improve Outcome: Progressing Goal: Ability to communicate needs accurately will improve Outcome: Progressing   Problem: Nutrition: Goal: Risk of aspiration will decrease Outcome: Progressing Goal: Dietary intake will improve Outcome: Progressing   Problem: Education: Goal: Knowledge of General Education information will improve Description: Including pain rating scale, medication(s)/side effects and non-pharmacologic comfort measures Outcome: Progressing   Problem: Health Behavior/Discharge Planning: Goal: Ability to manage health-related needs will improve Outcome: Progressing   Problem: Clinical Measurements: Goal: Ability to maintain clinical measurements within normal limits will improve Outcome: Progressing Goal: Will remain free from infection Outcome: Progressing Goal: Diagnostic test results will improve Outcome: Progressing Goal: Respiratory complications will improve Outcome: Progressing Goal: Cardiovascular complication will be avoided Outcome: Progressing   Problem: Activity: Goal: Risk for activity intolerance will decrease Outcome: Progressing   Problem: Nutrition: Goal: Adequate nutrition will be maintained Outcome: Progressing   Problem: Coping: Goal: Level of anxiety will decrease Outcome: Progressing   Problem: Elimination: Goal: Will not experience complications related to bowel motility Outcome: Progressing Goal: Will not experience complications related to urinary retention Outcome: Progressing   Problem: Education: Goal: Knowledge of disease or condition will improve Outcome: Progressing Goal: Knowledge of secondary  prevention will improve (MUST DOCUMENT ALL) Outcome: Progressing Goal: Knowledge of patient specific risk factors will improve [place] N/A or DELETE if not current risk factor) Outcome: Progressing   Problem: Ischemic Stroke/TIA Tissue Perfusion: Goal: Complications of ischemic stroke/TIA will be minimized Outcome: Progressing   Problem: Coping: Goal: Will verbalize positive feelings about self Outcome: Progressing Goal: Will identify appropriate support needs Outcome: Progressing   Problem: Health Behavior/Discharge Planning: Goal: Ability to manage health-related needs will improve Outcome: Progressing Goal: Goals will be collaboratively established with patient/family Outcome: Progressing   Problem:  Self-Care: Goal: Ability to participate in self-care as condition permits will improve Outcome: Progressing Goal: Verbalization of feelings and concerns over difficulty with self-care will improve Outcome: Progressing Goal: Ability to communicate needs accurately will improve Outcome: Progressing   Problem: Nutrition: Goal: Risk of aspiration will decrease Outcome: Progressing Goal: Dietary intake will improve Outcome: Progressing

## 2022-02-14 NOTE — Progress Notes (Signed)
PROGRESS NOTE                                                                                                                                                                                                             Patient Demographics:    Steve Richards, is a 56 y.o. male, DOB - 22-Oct-1965, UEK:[id number]  Outpatient Primary MD for the patient is Girtha Rm, NP-C    LOS - 5  Admit date - 02/08/2022    Chief Complaint  Patient presents with   Stroke Symptoms       Brief Narrative (HPI from H&P)     56 y.o. male with PMH significant for hepatic cirrhosis, morbid obesity, aortic valve replacement with bioprosthetic valve, who presents with word finding difficulty, tremor and vision abnormalities.  He has been having some weakness in his left hand for the past few months had some outpatient neurological workup initially few months ago but could not remain compliant with it.  For the last few weeks he has been having some tremor-like activity in his right hand, he has developed right eye visual problem for which she saw an ophthalmologist and was noted to find right-sided hemianopsia, he also has noticed some problems finding words.   Presented to the ER where MRI was concerning for subtle cortical ribboning suspicious for autoimmune, paraneoplastic etiology vs could be CJD or infectious.  He was seen by neurologist and admitted to the hospital.    Subjective:   No headache chest or abdominal pain, still has visual field defect, some trouble in finding words along with incoordination in his arms and traumas.   Assessment  & Plan :   L. hand weakness presumably from ? pinched nerve in summer 2023 who presents with expressive aphasia, ataxia and tremor with right worse than left and R hemianopsia that has progressively worsened since 01/18/22.  -  Note symptoms ongoing since summer 2023, patient not compliant with outpatient  neurological workup, EEG stable, CT head and MRI noted findings suspicious for  autoimmune, paraneoplastic etiology vs could be CJD or infectious.  Seen by neurology underwent LP shows elevated protein, specific testing for CGD etc. ordered by neurology - CSF autoimmune encephalopathy panel, Oligoclonal bands and IgG index, Lyme PCR, VDRL CSF, protein 14-3-3 and T-Tau with reflex to RT-Quic, CSF ACE.  Continue IV  Steroids X 5 days per Neuro, PT OT and supportive care.  Seen by speech as well.  Continue to monitor closely. -Neurology input greatly appreciated, so far no clear etiology, possible autoimmune (she is on steroids), paraneoplastic etiology versus possible CGD or infectious.   HX of bioprosthetic AVR.   - Monitor with supportive care.  Stable prosthesis in aortic valve physiology on last echo few months ago.  Chronic diastolic CHF EF of 34% on echo done few months ago.   -Compensated.   Obesity.   - BMI of 35.  Follow-up with PCP.  History of cirrhosis of liver.  Question NASH.   - Stable INR and LFTs, outpatient GI workup.    Polycythemia.   - Likely due to OSA, monitor.  Small stable subarachnoid hemorrhage noted on MRI.   - Symptom-free.  Monitor.  Holding aspirin or anticoagulation for now.  Hypothyroidism.   - Home dose Synthroid increased, repeat TSH and free T4 in 3 to 4 weeks.  DM type II.  SSI.  Lab Results  Component Value Date   HGBA1C 6.2 (H) 02/10/2022   CBG (last 3)  Recent Labs    02/13/22 2100 02/14/22 0805 02/14/22 1216  GLUCAP 211* 169* 193*        Condition - Fair  Family Communication  :  None at bedside  Code Status :  Full  Consults  :  Neuro  PUD Prophylaxis :    Procedures  :     LP 02/09/22  MRI - 1. Small 14 mm focus suspicious for subarachnoid hemorrhage at the right vertex adjacent to the falx. This was subtle on the earlier CT, not apparent on MRI last month. Etiology and significance is unclear, has there been recent fall?  No other acute intracranial hemorrhage is identified, but there is a small number of nonspecific chronic microhemorrhages in both hemispheres. 2. Otherwise a largely unremarkable MRI appearance of the brain, HOWEVER, suspicion of subtle abnormal cortical diffusion widely scattered in the left hemisphere. Although with no cortical edema or abnormal signal on the remaining sequences. No enhancement. Given the clinical suspicion Creutzfeldt-Jakob Disease cannot be excluded. Differential diagnosis would include autoimmune or paraneoplastic encephalitis       Disposition Plan  :    Status is: Inpatient  DVT Prophylaxis  :  Place and maintain sequential compression device Start: 02/09/22 1757   Lab Results  Component Value Date   PLT 158 02/14/2022    Diet :  Diet Order             Diet regular Room service appropriate? Yes with Assist; Fluid consistency: Thin  Diet effective now                    Inpatient Medications  Scheduled Meds:  docusate sodium  200 mg Oral BID   insulin aspart  0-15 Units Subcutaneous TID WC   insulin aspart  0-5 Units Subcutaneous QHS   levothyroxine  100 mcg Oral Daily   multivitamin with minerals  1 tablet Oral Daily   pantoprazole  40 mg Oral Daily   polyethylene glycol  17 g Oral BID   Continuous Infusions:  methylPREDNISolone (SOLU-MEDROL) injection 1,000 mg (02/13/22 1646)   PRN Meds:.acetaminophen **OR** acetaminophen (TYLENOL) oral liquid 160 mg/5 mL **OR** acetaminophen, senna-docusate  Antibiotics  :    Anti-infectives (From admission, onward)    None         Objective:   Vitals:   02/14/22 0420 02/14/22  4536 02/14/22 0845 02/14/22 1217  BP: (!) 135/30 (!) 179/96  (!) 145/82  Pulse: (!) 105 71  76  Resp: (!) 22 18 13 20   Temp: 97.6 F (36.4 C) 98.3 F (36.8 C)  98 F (36.7 C)  TempSrc: Oral Oral  Oral  SpO2: 100%   94%  Weight:      Height:        Wt Readings from Last 3 Encounters:  02/09/22 120 kg  02/08/22  118.4 kg  01/31/22 118.4 kg    No intake or output data in the 24 hours ending 02/14/22 1311     Physical Exam   Alert, with mild aphasia, right hemianopsia, extremity dyskinesia, left > right Symmetrical Chest wall movement, Good air movement bilaterally, CTAB RRR,No Gallops,Rubs or new Murmurs, No Parasternal Heave +ve B.Sounds, Abd Soft, No tenderness, No rebound - guarding or rigidity. No Cyanosis, Clubbing or edema, No new Rash or bruise       Data Review:    Recent Labs  Lab 02/08/22 1818 02/08/22 1837 02/11/22 0210 02/12/22 0310 02/13/22 0321 02/14/22 0516  WBC 8.2  --  6.2 13.4* 10.9* 8.2  HGB 17.8* 18.7* 17.0 17.2* 16.3 16.6  HCT 52.7* 55.0* 50.0 50.8 49.3 51.0  PLT 202  --  176 195 149* 158  MCV 89.5  --  88.0 89.0 89.8 89.3  MCH 30.2  --  29.9 30.1 29.7 29.1  MCHC 33.8  --  34.0 33.9 33.1 32.5  RDW 13.2  --  12.9 13.2 13.2 13.3  LYMPHSABS 1.6  --  0.4* 0.5* 0.5* 0.3*  MONOABS 0.7  --  0.0* 0.2 0.1 0.1  EOSABS 0.1  --  0.0 0.0 0.0 0.0  BASOSABS 0.0  --  0.0 0.0 0.0 0.0    Recent Labs  Lab 02/08/22 1818 02/08/22 1837 02/10/22 0616 02/11/22 0210 02/12/22 0310 02/13/22 0321 02/14/22 0516  NA 135 138  --  136 140 139 137  K 3.9 3.9  --  4.3 4.1 4.8 3.8  CL 100 102  --  104 105 103 102  CO2 23  --   --  22 24 25 25   GLUCOSE 118* 118*  --  231* 165* 178* 177*  BUN 9 11  --  10 12 15 14   CREATININE 0.85 0.80  --  0.91 0.98 0.90 0.91  AST 42*  --   --  40 36 35 32  ALT 31  --   --  32 32 35 37  ALKPHOS 49  --   --  51 49 47 47  BILITOT 1.0  --   --  0.9 0.7 0.7 0.9  ALBUMIN 4.1  --   --  3.9 3.9 3.7 3.8  CRP  --   --  <0.5 0.5 0.5 <0.5 <0.5  INR 1.0  --   --   --   --   --   --   TSH  --   --  9.391*  --   --   --   --   HGBA1C  --   --  6.2*  --   --   --   --   BNP  --   --   --  52.9 34.2 433.0* 187.6*  MG  --   --   --  2.0 2.1 2.4 2.2  CALCIUM 9.2  --   --  9.3 9.2 8.7* 8.5*    ----------------------------------------------------------------------------------------------------------------- No results for input(s): "CHOL", "HDL", "LDLCALC", "  TRIG", "CHOLHDL", "LDLDIRECT" in the last 72 hours.   Lab Results  Component Value Date   HGBA1C 6.2 (H) 02/10/2022    No results for input(s): "TSH", "T4TOTAL", "T3FREE", "THYROIDAB" in the last 72 hours.  Invalid input(s): "FREET3"     Radiology Reports No results found.    Signature  -   Phillips Climes M.D on 02/14/2022 at 1:11 PM   -  To page go to [email or web address]

## 2022-02-14 NOTE — Progress Notes (Signed)
Mobility Specialist: Progress Note   02/14/22 1755  Mobility  Activity Ambulated with assistance in hallway  Level of Assistance Minimal assist, patient does 75% or more  Assistive Device Front wheel walker  Distance Ambulated (ft) 70 ft  Activity Response Tolerated well  Mobility Referral Yes  $Mobility charge 1 Mobility   Post-Mobility: 85 HR, 177/99 (122) BP, 98% SpO2  Pt received in the bed and agreeable to mobility. MinA to stand for balance. Physical assist for RW management and direction. Pt experienced LOB x1 to Lt side d/t cross over gait and unable to recover resulting in a fall. RN present and assisted pt to chair to be wheeled back to the room. Pt transferred back to bed +2 minA. Pt has call bell at his side. Bed alarm is on.   [place] Mobility Specialist Please contact via SecureChat or Rehab office at [phone]

## 2022-02-14 NOTE — Progress Notes (Signed)
Inpatient Rehab Admissions Coordinator:    Pt. Remains unwilling to discuss the possibility of needing rehab and then 24/7 caergiver support until results are received from [facility]. I think it is highly unlikely that Pt. Would be able to d/c from CIR without 24/7 support though and I spoke with pt.'s sister and friend, Teola Bradley, who confirm that Pt. Does not have anyone to provide that level of care. He will very likely need SNF after work up is complete.    Clemens Catholic, [place], [place] Admissions Coordinator  [phone] ([place]) [phone] (office)

## 2022-02-15 ENCOUNTER — Encounter: Payer: Commercial Managed Care - HMO | Admitting: Occupational Therapy

## 2022-02-15 ENCOUNTER — Inpatient Hospital Stay (HOSPITAL_COMMUNITY): Payer: Commercial Managed Care - HMO

## 2022-02-15 ENCOUNTER — Ambulatory Visit: Payer: Commercial Managed Care - HMO | Admitting: Family Medicine

## 2022-02-15 DIAGNOSIS — R2990 Unspecified symptoms and signs involving the nervous system: Secondary | ICD-10-CM | POA: Diagnosis not present

## 2022-02-15 DIAGNOSIS — R270 Ataxia, unspecified: Secondary | ICD-10-CM | POA: Diagnosis not present

## 2022-02-15 DIAGNOSIS — H5347 Heteronymous bilateral field defects: Secondary | ICD-10-CM | POA: Diagnosis not present

## 2022-02-15 LAB — IGG CSF INDEX
Albumin CSF-mCnc: 39 mg/dL (ref 15–55)
Albumin: 4.3 g/dL (ref 3.8–4.9)
CSF IgG Index: 0.7 (ref 0.0–0.7)
IgG (Immunoglobin G), Serum: 916 mg/dL (ref 603–1613)
IgG, CSF: 5.6 mg/dL (ref 0.0–10.3)
IgG/Alb Ratio, CSF: 0.14 (ref 0.00–0.25)

## 2022-02-15 LAB — COMPREHENSIVE METABOLIC PANEL
ALT: 42 U/L (ref 0–44)
AST: 42 U/L — ABNORMAL HIGH (ref 15–41)
Albumin: 3.9 g/dL (ref 3.5–5.0)
Alkaline Phosphatase: 49 U/L (ref 38–126)
Anion gap: 13 (ref 5–15)
BUN: 14 mg/dL (ref 6–20)
CO2: 26 mmol/L (ref 22–32)
Calcium: 9.1 mg/dL (ref 8.9–10.3)
Chloride: 98 mmol/L (ref 98–111)
Creatinine, Ser: 1 mg/dL (ref 0.61–1.24)
GFR, Estimated: >60 mL/min (ref >60)
Glucose, Bld: 198 mg/dL — ABNORMAL HIGH (ref 70–99)
Potassium: 4.2 mmol/L (ref 3.5–5.1)
Sodium: 137 mmol/L (ref 135–145)
Total Bilirubin: 0.7 mg/dL (ref 0.3–1.2)
Total Protein: 6.9 g/dL (ref 6.5–8.1)

## 2022-02-15 LAB — CBC WITH DIFFERENTIAL/PLATELET
Abs Immature Granulocytes: 0.04 10*3/uL (ref 0.00–0.07)
Basophils Absolute: 0 10*3/uL (ref 0.0–0.1)
Basophils Relative: 0 %
Eosinophils Absolute: 0 10*3/uL (ref 0.0–0.5)
Eosinophils Relative: 0 %
HCT: 50.2 % (ref 39.0–52.0)
Hemoglobin: 16.9 g/dL (ref 13.0–17.0)
Immature Granulocytes: 1 %
Lymphocytes Relative: 4 %
Lymphs Abs: 0.3 10*3/uL — ABNORMAL LOW (ref 0.7–4.0)
MCH: 29.6 pg (ref 26.0–34.0)
MCHC: 33.7 g/dL (ref 30.0–36.0)
MCV: 87.9 fL (ref 80.0–100.0)
Monocytes Absolute: 0.2 10*3/uL (ref 0.1–1.0)
Monocytes Relative: 3 %
Neutro Abs: 7.7 10*3/uL (ref 1.7–7.7)
Neutrophils Relative %: 92 %
Platelets: 130 10*3/uL — ABNORMAL LOW (ref 150–400)
RBC: 5.71 MIL/uL (ref 4.22–5.81)
RDW: 13.2 % (ref 11.5–15.5)
WBC: 8.3 10*3/uL (ref 4.0–10.5)
nRBC: 0 % (ref 0.0–0.2)

## 2022-02-15 LAB — GLUCOSE, CAPILLARY
Glucose-Capillary: 163 mg/dL — ABNORMAL HIGH (ref 70–99)
Glucose-Capillary: 208 mg/dL — ABNORMAL HIGH (ref 70–99)
Glucose-Capillary: 217 mg/dL — ABNORMAL HIGH (ref 70–99)
Glucose-Capillary: 198 mg/dL — ABNORMAL HIGH (ref 70–99)

## 2022-02-15 MED ORDER — GADOBUTROL 1 MMOL/ML IV SOLN
10.0000 mL | Freq: Once | INTRAVENOUS | Status: AC | PRN
  Administered 2022-02-15: 10 mL via INTRAVENOUS

## 2022-02-15 NOTE — Progress Notes (Signed)
Physical Therapy Treatment Patient Details Name: Steve Richards MRN: [id number] DOB: [birth year] Today's Date: 02/15/2022   History of Present Illness 56 y.o. male adm 12/7 with PMH significant for hepatic cirrhosis, morbid obesity, aortic valve replacement with bioprosthetic valve, who presentd with word finding difficulty, tremor and vision abnormalities.  MRI: Small 14 mm focus suspicious for subarachnoid hemorrhage at the right vertex adjacent to the falx, "largely unremarkable MRI" Per neuro consult, "Differential includes autoimmune, paraneoplastic etiology vs could be CJD or infectious."    PT Comments    Functionally pt continues to decline and is now requiring 2 person assist to amb safely. Tremors, ataxia, and decr cognition are contributing to this. Pt does not have dc support for AIR so updated dc recommendation to SNF.    Recommendations for follow up therapy are one component of a multi-disciplinary discharge planning process, led by the attending physician.  Recommendations may be updated based on patient status, additional functional criteria and insurance authorization.  Follow Up Recommendations  Skilled nursing-short term rehab (<3 hours/day)     Assistance Recommended at Discharge Frequent or constant Supervision/Assistance  Patient can return home with the following Assistance with cooking/housework;Assist for transportation;Help with stairs or ramp for entrance;A lot of help with bathing/dressing/bathroom;Two people to help with walking and/or transfers   Equipment Recommendations  Rolling walker (2 wheels);Wheelchair (measurements PT);Wheelchair cushion (measurements PT)    Recommendations for Other Services       Precautions / Restrictions Precautions Precautions: Fall;Other (comment) Precaution Comments: In hospital fall 12/13, imparied vision Restrictions Weight Bearing Restrictions: No     Mobility  Bed Mobility Overal bed mobility: Needs Assistance Bed  Mobility: Supine to Sit     Supine to sit: Mod assist, +2 for safety/equipment, HOB elevated     General bed mobility comments: Assist to elevate trunk into sitting    Transfers Overall transfer level: Needs assistance Equipment used: Rolling walker (2 wheels) Transfers: Sit to/from Stand Sit to Stand: Mod assist, +2 physical assistance, +2 safety/equipment, Min assist           General transfer comment: Assist to bring hips up and for balance. First stand required +2 mod assist and pt with great difficulty with hand and foot placement. Had 1 foot inside walker and one outside walker. Improved to +2 min assist with repetition. Had pt with rt hand on walker and to push up from sitting surface with LUE. Pt with great difficulty understanding this despite verbal/tactile cues and became more confused the more we explained what we wanted him to do. No carry over to subsequent transfers.    Ambulation/Gait Ambulation/Gait assistance: Mod assist, +2 physical assistance, +2 safety/equipment Gait Distance (Feet): 40 Feet (40' x 1, 20' x1) Assistive device: Rolling walker (2 wheels) Gait Pattern/deviations: Step-through pattern, Decreased stride length, Shuffle, Ataxic, Staggering left, Wide base of support, Decreased step length - right, Decreased step length - left Gait velocity: decr Gait velocity interpretation: <1.31 ft/sec, indicative of household ambulator   General Gait Details: Assist for balance and support and to guide walker. With pt's size once he begins to lose balance it is difficult to correct   Stairs             Wheelchair Mobility    Modified Rankin (Stroke Patients Only)       Balance Overall balance assessment: Needs assistance Sitting-balance support: Feet supported Sitting balance-Leahy Scale: Fair     Standing balance support: Bilateral upper extremity supported Standing balance-Leahy Scale: Poor Standing  balance comment: walker and min assist for  static standing                            Cognition Arousal/Alertness: Awake/alert Behavior During Therapy: Flat affect Overall Cognitive Status: Impaired/Different from baseline Area of Impairment: Problem solving, Safety/judgement, Awareness, Following commands                       Following Commands: Follows one step commands with increased time, Follows one step commands inconsistently Safety/Judgement: Decreased awareness of safety, Decreased awareness of deficits Awareness: Intellectual Problem Solving: Slow processing, Difficulty sequencing, Requires verbal cues, Requires tactile cues General Comments: Pt with great difficulty following commands for hand placement with sit to stand even with verbal and tactile cues        Exercises      General Comments        Pertinent Vitals/Pain      Home Living                          Prior Function            PT Goals (current goals can now be found in the care plan section) Acute Rehab PT Goals Patient Stated Goal: find out what is causing problems Progress towards PT goals: Not progressing toward goals - comment    Frequency    Min 3X/week      PT Plan Current plan remains appropriate;Discharge plan needs to be updated    Co-evaluation              AM-PAC PT "6 Clicks" Mobility   Outcome Measure  Help needed turning from your back to your side while in a flat bed without using bedrails?: A Little Help needed moving from lying on your back to sitting on the side of a flat bed without using bedrails?: A Lot Help needed moving to and from a bed to a chair (including a wheelchair)?: Total Help needed standing up from a chair using your arms (e.g., wheelchair or bedside chair)?: Total Help needed to walk in hospital room?: Total Help needed climbing 3-5 steps with a railing? : Total 6 Click Score: 9    End of Session Equipment Utilized During Treatment: Gait belt Activity  Tolerance: Patient tolerated treatment well Patient left: in chair;with call bell/phone within reach;with chair alarm set Nurse Communication: Mobility status;Need for lift equipment PT Visit Diagnosis: Unsteadiness on feet (R26.81);Other abnormalities of gait and mobility (R26.89);Ataxic gait (R26.0)     Time: 3151-7616 PT Time Calculation (min) (ACUTE ONLY): 28 min  Charges:  $Gait Training: 23-37 mins                     [place] 02/15/2022, 4:35 PM

## 2022-02-15 NOTE — Progress Notes (Signed)
Inpatient Rehab Admissions Coordinator:  Saw pt at bedside. Pt gave [facility] permission to contact Teola Bradley to help determine appropriate rehab venue. Spoke with Sharee Pimple and she informed AC that she and some of pt's other friends would not be able to provide 24/7 support for pt after discharge. She recommended contacting pt's sister Pamala Hurry. Spoke with Pamala Hurry. She informed AC that she and their brother in [place] would not be able to provide 24/7 support for pt. TOC made aware. Other rehab venues should be pursued.   Gayland Curry, [place], [place] Admissions Coordinator [phone]

## 2022-02-15 NOTE — Progress Notes (Signed)
Nutrition Follow-up  DOCUMENTATION CODES:   Not applicable  INTERVENTION:   Encourage good PO intake Continue Multivitamin w/ minerals daily Double protein portions with meals.  NUTRITION DIAGNOSIS:   Increased nutrient needs related to chronic illness as evidenced by estimated needs. - Ongoing  GOAL:   Patient will meet greater than or equal to 90% of their needs - Ongoing  MONITOR:   PO intake, Labs, I & O's  REASON FOR ASSESSMENT:   Consult Assessment of nutrition requirement/status  ASSESSMENT:   56 y.o. male presented to the ED with diplopia, R hemianopsia, ataxia, aphasia, and dyscoordination. [facility] on 11/27 and in the ED on 11/28 with similar symptoms. PMH includes CHF, T2DM, and hepatic cirrhosis.Pt admitted with stroke like symptoms.  Pt sleeping in chair, woke to RD voice and touch. Reports that he has been eating well, eating most of what they send up. RD observed pt breakfast tray, ~90% complete. Discussed good PO intake to maintain lean muscle mass. Pt unable to provide diet history PTA. Pt with no questions at this time.   Meal Intake 12/09-12/14: 90-100% x 5 meals (average 98%)  Medications reviewed and include: Colace, NovoLog SSI, MVI, Protonix, Miralax Labs reviewed: Hgb A1c 7.1% (11/16/21), 24 hr CBGs  119-217  Diet Order:   Diet Order             Diet regular Room service appropriate? Yes with Assist; Fluid consistency: Thin  Diet effective now                   EDUCATION NEEDS:   Not appropriate for education at this time  Skin:  Skin Assessment: Reviewed RN Assessment  Last BM:  12/13 - Type 4  Height:   Ht Readings from Last 1 Encounters:  02/09/22 6' (1.829 m)    Weight:   Wt Readings from Last 1 Encounters:  02/09/22 120 kg    Ideal Body Weight:  80.9 kg  BMI:  Body mass index is 35.88 kg/m.  Estimated Nutritional Needs:   Kcal:  2200-2400  Protein:  110-130 grams  Fluid:  >/= 2 L    Hermina Barters RD, LDN Clinical Dietitian See [facility] for contact information.

## 2022-02-15 NOTE — Progress Notes (Signed)
Mobility Specialist Progress Note   02/15/22 1600  Mobility  Activity Repositioned in chair (LE exercises)  Level of Assistance Standby assist, set-up cues, supervision of patient - no hands on  Assistive Device None  Range of Motion/Exercises Active;All extremities  Activity Response Tolerated well   Patient received in recliner and agreeable to participate but deferred ambulation. Opted to chair level exercises. Completed LE exercises with supervision. Tolerated without complaint or incident. Was left in recliner with all needs met, call bell in reach.   Steve Richards, BS EXP Mobility Specialist Please contact via SecureChat or Rehab office at [phone]

## 2022-02-15 NOTE — Progress Notes (Signed)
PROGRESS NOTE                                                                                                                                                                                                             Patient Demographics:    Steve Richards, is a 56 y.o. male, DOB - [birth year], MAU:[id number]  Outpatient Primary MD for the patient is Girtha Rm, NP-C    LOS - 6  Admit date - 02/08/2022    Chief Complaint  Patient presents with   Stroke Symptoms       Brief Narrative (HPI from H&P)     56 y.o. male with PMH significant for hepatic cirrhosis, morbid obesity, aortic valve replacement with bioprosthetic valve, who presents with word finding difficulty, tremor and vision abnormalities.  He has been having some weakness in his left hand for the past few months had some outpatient neurological workup initially few months ago but could not remain compliant with it.  For the last few weeks he has been having some tremor-like activity in his right hand, he has developed right eye visual problem for which she saw an ophthalmologist and was noted to find right-sided hemianopsia, he also has noticed some problems finding words.   Presented to the ER where MRI was concerning for subtle cortical ribboning suspicious for autoimmune, paraneoplastic etiology vs could be CJD or infectious.  He was seen by neurologist and admitted to the hospital.    Subjective:    Denies any change in his deficits, denies nausea, vomiting, chest pain or shortness of breath.   Assessment  & Plan :   L. hand weakness presumably from ? pinched nerve in summer 2023 who presents with expressive aphasia, ataxia and tremor with right worse than left and R hemianopsia that has progressively worsened since 01/18/22.  -  Note symptoms ongoing since summer 2023, patient not compliant with outpatient neurological workup, EEG stable, CT head and MRI  noted findings suspicious for  autoimmune, paraneoplastic etiology vs could be CJD or infectious.  Seen by neurology underwent LP shows elevated protein, specific testing for CGD etc. ordered by neurology - CSF autoimmune encephalopathy panel, Oligoclonal bands and IgG index, Lyme PCR, VDRL CSF, protein 14-3-3 and T-Tau with reflex to RT-Quic, CSF ACE.  Continue IV Steroids X 5 days per Neuro, PT OT  and supportive care.  Seen by speech as well.  Continue to monitor closely. -Neurology input greatly appreciated, he received total 5 days of high-dose IV steroids without much improvement, paraneoplastic workup still pending, repeat MRI brain pending, will await neurology regarding further recommendations.   -CTA head and neck with no significant large vessel stenosis or occlusion  HX of bioprosthetic AVR.   - Monitor with supportive care.  Stable prosthesis in aortic valve physiology on last echo few months ago.  Chronic diastolic CHF EF of 27% on echo done few months ago.   -Compensated.   Obesity.   - BMI of 35.  Follow-up with PCP.  History of cirrhosis of liver.  Question NASH.   - Stable INR and LFTs, outpatient GI workup.    Polycythemia.   - Likely due to OSA, monitor.  Small stable subarachnoid hemorrhage noted on MRI.   - Symptom-free.  Monitor.  Holding aspirin or anticoagulation for now.  Hypothyroidism.   - Home dose Synthroid increased, repeat TSH and free T4 in 3 to 4 weeks.  DM type II.  SSI.  Lab Results  Component Value Date   HGBA1C 6.2 (H) 02/10/2022   CBG (last 3)  Recent Labs    02/14/22 2123 02/15/22 0824 02/15/22 1104  GLUCAP 321* 163* 208*        Condition - Fair  Family Communication  :  None at bedside  Code Status :  Full  Consults  :  Neuro  PUD Prophylaxis :    Procedures  :     LP 02/09/22  MRI - 1. Small 14 mm focus suspicious for subarachnoid hemorrhage at the right vertex adjacent to the falx. This was subtle on the earlier CT, not  apparent on MRI last month. Etiology and significance is unclear, has there been recent fall? No other acute intracranial hemorrhage is identified, but there is a small number of nonspecific chronic microhemorrhages in both hemispheres. 2. Otherwise a largely unremarkable MRI appearance of the brain, HOWEVER, suspicion of subtle abnormal cortical diffusion widely scattered in the left hemisphere. Although with no cortical edema or abnormal signal on the remaining sequences. No enhancement. Given the clinical suspicion Creutzfeldt-Jakob Disease cannot be excluded. Differential diagnosis would include autoimmune or paraneoplastic encephalitis       Disposition Plan  :    Status is: Inpatient  DVT Prophylaxis  :  Place and maintain sequential compression device Start: 02/15/22 0704 Place and maintain sequential compression device Start: 02/09/22 1757   Lab Results  Component Value Date   PLT 130 (L) 02/15/2022    Diet :  Diet Order             Diet regular Room service appropriate? Yes with Assist; Fluid consistency: Thin  Diet effective now                    Inpatient Medications  Scheduled Meds:  docusate sodium  200 mg Oral BID   insulin aspart  0-15 Units Subcutaneous TID WC   insulin aspart  0-5 Units Subcutaneous QHS   levothyroxine  100 mcg Oral Daily   multivitamin with minerals  1 tablet Oral Daily   pantoprazole  40 mg Oral Daily   polyethylene glycol  17 g Oral BID   Continuous Infusions:   PRN Meds:.acetaminophen **OR** acetaminophen (TYLENOL) oral liquid 160 mg/5 mL **OR** acetaminophen, senna-docusate  Antibiotics  :    Anti-infectives (From admission, onward)    None  Objective:   Vitals:   02/15/22 0038 02/15/22 0336 02/15/22 0826 02/15/22 1328  BP: (!) 152/96 (!) 163/98 (!) 151/107 135/81  Pulse: 68 77 71   Resp: (!) 21 19 17 16   Temp: 98.3 F (36.8 C) 97.6 F (36.4 C) 97.8 F (36.6 C)   TempSrc: Oral Oral Oral   SpO2: 94% 93%  95%   Weight:      Height:        Wt Readings from Last 3 Encounters:  02/09/22 120 kg  02/08/22 118.4 kg  01/31/22 118.4 kg     Intake/Output Summary (Last 24 hours) at 02/15/2022 1545 Last data filed at 02/15/2022 0900 Gross per 24 hour  Intake 360 ml  Output 600 ml  Net -240 ml       Physical Exam   Alert, with mild aphasia, right hemianopsia, extremity dyskinesia, left > right Symmetrical Chest wall movement, Good air movement bilaterally, CTAB RRR,No Gallops,Rubs or new Murmurs, No Parasternal Heave +ve B.Sounds, Abd Soft, No tenderness, No rebound - guarding or rigidity. No Cyanosis, Clubbing or edema, No new Rash or bruise       Data Review:    Recent Labs  Lab 02/11/22 0210 02/12/22 0310 02/13/22 0321 02/14/22 0516 02/15/22 0421  WBC 6.2 13.4* 10.9* 8.2 8.3  HGB 17.0 17.2* 16.3 16.6 16.9  HCT 50.0 50.8 49.3 51.0 50.2  PLT 176 195 149* 158 130*  MCV 88.0 89.0 89.8 89.3 87.9  MCH 29.9 30.1 29.7 29.1 29.6  MCHC 34.0 33.9 33.1 32.5 33.7  RDW 12.9 13.2 13.2 13.3 13.2  LYMPHSABS 0.4* 0.5* 0.5* 0.3* 0.3*  MONOABS 0.0* 0.2 0.1 0.1 0.2  EOSABS 0.0 0.0 0.0 0.0 0.0  BASOSABS 0.0 0.0 0.0 0.0 0.0    Recent Labs  Lab 02/08/22 1818 02/08/22 1837 02/10/22 0616 02/11/22 0210 02/12/22 0310 02/13/22 0321 02/14/22 0516 02/15/22 0956  NA 135   < >  --  136 140 139 137 137  K 3.9   < >  --  4.3 4.1 4.8 3.8 4.2  CL 100   < >  --  104 105 103 102 98  CO2 23  --   --  22 24 25 25 26   GLUCOSE 118*   < >  --  231* 165* 178* 177* 198*  BUN 9   < >  --  10 12 15 14 14   CREATININE 0.85   < >  --  0.91 0.98 0.90 0.91 1.00  AST 42*  --   --  40 36 35 32 42*  ALT 31  --   --  32 32 35 37 42  ALKPHOS 49  --   --  51 49 47 47 49  BILITOT 1.0  --   --  0.9 0.7 0.7 0.9 0.7  ALBUMIN 4.1   < >  --  3.9 3.9 3.7 3.8 3.9  CRP  --   --  <0.5 0.5 0.5 <0.5 <0.5  --   INR 1.0  --   --   --   --   --   --   --   TSH  --   --  9.391*  --   --   --   --   --   HGBA1C  --   --   6.2*  --   --   --   --   --   BNP  --   --   --  52.9 34.2  433.0* 187.6*  --   MG  --   --   --  2.0 2.1 2.4 2.2  --   CALCIUM 9.2  --   --  9.3 9.2 8.7* 8.5* 9.1   < > = values in this interval not displayed.   ----------------------------------------------------------------------------------------------------------------- No results for input(s): "CHOL", "HDL", "LDLCALC", "TRIG", "CHOLHDL", "LDLDIRECT" in the last 72 hours.   Lab Results  Component Value Date   HGBA1C 6.2 (H) 02/10/2022    No results for input(s): "TSH", "T4TOTAL", "T3FREE", "THYROIDAB" in the last 72 hours.  Invalid input(s): "FREET3"     Radiology Reports MR BRAIN W WO CONTRAST  Result Date: 02/15/2022 CLINICAL DATA:  Neuro deficit, neurodegenerative disorder suspected. 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 CONTRAST:  3m GADAVIST GADOBUTROL 1 MMOL/ML IV SOLN COMPARISON:  MRI head 02/09/2022, MRI 01/30/2022 FINDINGS: Brain: Again noted is subtle diffusion abnormality mostly in the left parietal lobe with cortical hyperintensity on DWI which shows restricted diffusion on ADC map. This may have progressed from the prior studies. This is asymmetric. There may be some early similar findings in the right parietal lobe. Enhancement in this area is normal. No underlying mass or infarct. Normal diffusion-weighted imaging in the brainstem and basal ganglia. Improvement in subarachnoid hemorrhage over the right frontal convexity, presumably posttraumatic. Scattered areas of chronic microhemorrhage in both cerebral hemispheres stable. This is mild. No fluid collection. Ventricle size normal. No midline shift. Vascular: Normal arterial flow voids Skull and upper cervical spine: No focal skeletal lesion. Sinuses/Orbits: Mild mucosal edema  paranasal sinuses. Negative orbit Other: None IMPRESSION: 1. Subtle diffusion abnormality mostly in the left parietal lobe. This may have progressed from the prior studies. There may be some early similar findings in the right parietal lobe. No underlying mass or infarct. Differential diagnosis includes DJD but nonspecific. Correlate with lumbar puncture. 2. Improvement in subarachnoid hemorrhage over the right frontal convexity. 3. Scattered areas of chronic microhemorrhage in both cerebral hemispheres stable from the prior study. This may be due to chronic hypertension. Electronically Signed   By: CFranchot GalloM.D.   On: 02/15/2022 15:23   CT ANGIO HEAD W OR WO CONTRAST  Result Date: 02/14/2022 CLINICAL DATA:  Subarachnoid hemorrhage EXAM: CT ANGIOGRAPHY HEAD TECHNIQUE: Multidetector CT imaging of the head was performed using the standard protocol during bolus administration of intravenous contrast. Multiplanar CT image reconstructions and MIPs were obtained to evaluate the vascular anatomy. RADIATION DOSE REDUCTION: This exam was performed according to the departmental dose-optimization program which includes automated exposure control, adjustment of the mA and/or kV according to patient size and/or use of iterative reconstruction technique. CONTRAST:  736mOMNIPAQUE IOHEXOL 350 MG/ML SOLN COMPARISON:  None Available. FINDINGS: CT HEAD Brain: There is no mass, hemorrhage or extra-axial collection. The size and configuration of the ventricles and extra-axial CSF spaces are normal. The brain parenchyma is normal, without acute or chronic infarction. Previously seen superior right parafalcine hyperdensity is no longer visible. Vascular: No abnormal hyperdensity of the major intracranial arteries or dural venous sinuses. No intracranial atherosclerosis. Skull: The visualized skull base, calvarium and extracranial soft tissues are normal. Sinuses/Orbits: No fluid levels or advanced mucosal thickening of the  visualized paranasal sinuses. No mastoid or middle ear effusion. The orbits are normal. CTA HEAD POSTERIOR CIRCULATION: --Vertebral arteries: Normal --Inferior cerebellar arteries: Normal. --Basilar artery: Normal. --Superior  cerebellar arteries: Normal. --Posterior cerebral arteries: Normal. ANTERIOR CIRCULATION: --Intracranial internal carotid arteries: Normal. --Anterior cerebral arteries (ACA): Normal. --Middle cerebral arteries (MCA): Normal. ANATOMIC VARIANTS: None Review of the MIP images confirms the above findings. IMPRESSION: Normal CTA of the head.  No acute subarachnoid hemorrhage. Electronically Signed   By: Ulyses Jarred M.D.   On: 02/14/2022 21:27      Signature  -   Emeline Gins Alany Borman M.D on 02/15/2022 at 3:45 PM   -  To page go to [email or web address]

## 2022-02-15 NOTE — Progress Notes (Signed)
NEUROLOGY PROGRESS NOTE   Date of service: February 15, 2022 Patient Name: Steve Richards MRN:  [id number] DOB:  1965-12-24  Brief HPI  Steve Richards is a 56 y.o. male with a PMHx significant for cirrhosis, obesity and aortic valve replacement with bioprosthetic valve who presented with expressive aphasia, right sided tremor and vision changes. Patient states several months ago, he began having cramping and weakness in his left hand. He was told he had a pinched nerve and was directed to get an EMG, which he did not follow-up on. Around 01/18/22, he noticed decreased coordination in both his right arm and leg. Pt also states that his vision is blurry and that objects appear to jump around.  MRI brain is concerning for cortical ribboning on DWI, small 27m focus [facility] negative for infection on initial labs, steroids started. Undergoing CJD and autoimmune workup.  CT angio pending.    Interval Hx   Patient has finished his course of steroids but notes no improvement in his tremor or speech abnormalities.  Still awaiting results of RT quick and autoimmune labs.  MRI brain with and without contrast from today continues to show cortical ribboning on DWI with improvement in right frontal subarachnoid hemorrhage.  Patient had a fall today while walking.  Vitals   Vitals:   02/15/22 0038 02/15/22 0336 02/15/22 0826 02/15/22 1328  BP: (!) 152/96 (!) 163/98 (!) 151/107 135/81  Pulse: 68 77 71   Resp: (!) 21 19 17 16   Temp: 98.3 F (36.8 C) 97.6 F (36.4 C) 97.8 F (36.6 C)   TempSrc: Oral Oral Oral   SpO2: 94% 93% 95%   Weight:      Height:         Body mass index is 35.88 kg/m.  Physical Exam   General: Sitting in chair; in no acute distress.  Resp: unlabored breathing   Neurologic Examination  Mental status/Cognition: Patient is alert and oriented to person, place time and situation, however he is unable to state which hospital he is and or where the hospital is.  He is  able to follow simple and two-step commands. Speech/language: Speech has a stuttering pattern with some dysarthria and word finding difficulties Cranial nerves:   CN II Pupils equal and reactive to light.    CN III,IV,VI EOM intact, no gaze preference or deviation, no nystagmus    CN V normal sensation in V1, V2, and V3 segments bilaterally    CN VII no asymmetry, no nasolabial fold flattening    CN VIII normal hearing to speech    CN IX & X Voice is dysarthric   CN XI 5/5 shoulder shrug bilaterally    CN XII midline tongue protrusion    Motor: Frequent coarse tremors seen in bilateral upper extremities. Able to move all extremities with good antigravity strength.  Sensation: Intact to light touch bilaterally  Coordination/Complex Motor:  Finger to Nose: intact with noted tremors bilaterally  Gait: deferred.   Labs   Basic Metabolic Panel:  Lab Results  Component Value Date   NA 137 02/15/2022   K 4.2 02/15/2022   CO2 26 02/15/2022   GLUCOSE 198 (H) 02/15/2022   BUN 14 02/15/2022   CREATININE 1.00 02/15/2022   CALCIUM 9.1 02/15/2022   GFRNONAA >60 02/15/2022   GFRAA 115 04/18/2020   HbA1c:  Lab Results  Component Value Date   HGBA1C 6.2 (H) 02/10/2022   LDL:  Lab Results  Component Value Date   LDLCALC  107 (H) 02/10/2022   Urine Drug Screen:     Component Value Date/Time   LABOPIA NONE DETECTED 07/21/2019 1555   COCAINSCRNUR Negative 01/31/2022 1414   LABBENZ NONE DETECTED 07/21/2019 1555   AMPHETMU NONE DETECTED 07/21/2019 1555   THCU NONE DETECTED 07/21/2019 1555   LABBARB NONE DETECTED 07/21/2019 1555    Alcohol Level     Component Value Date/Time   ETH <10 02/08/2022 1818   Meningitis/encephalitis panel: Negative CSF glucose 76 CSF protein 69 CSF red cell count 390 in tube 1 and tube 4 CSF white cell count 2 in tube 1, 1 and tube 4 Oligoclonal bands no oligoclonal bands observed in CSF, 1 paired band observed in CSF and serum IgG index 0.7 Lyme  PCR pending VDRL CSF pending Protein 14-3-3 and T-tau pending CSF ACE < 1.5 CSF culture no growth Thyroglobulin antibody elevated at 3.5 Ds DNA Ab negative ANA Ab negative Autoimmune encephalitis panel pending TSH 9.391 Beta Isoform pending  Imaging and Diagnostic studies  Results for orders placed during the hospital encounter of 02/08/22  CT HEAD WO CONTRAST (5MM)  1. Stable appearance of small focus of suspected subarachnoid hemorrhage along the right superior frontal gyrus. 2. No new areas of hemorrhage identified. 3. Chronic left maxillary and ethmoid sinus disease.   MRI brain 12/14 subtle diffusion abnormality concerning for cortical ribboning in left parietal lobe, improvement in subarachnoid hemorrhage over right frontal convexity, chronic microhemorrhages in bilateral hemispheres stable for from prior study   Impression   MRI brain is concerning for cortical ribboning, raising the question of CJD. LP negative for infection, RT-quic is pending. Meningitis/encephalitis panel is negative, and EEG reveals no seizure activity.   - Possible autoimmune process versus CJD causing right-sided tremor, expressive aphasia, and ataxia. Awaiting further test results.  -No improvement noted following 5 day course of high-dose steroids  Recommendations  Await RT-quic and other CSF study results [place] d/t small SAH seen on MRI CTangio for further work-up to assess for possible aneurysm  ______________________________________________________________________   Thank you for the opportunity to take part in the care of this patient. If you have any further questions, please contact the neurology consultation attending.  Hospital day # 5   Patient seen and examined by NP/APP with MD. MD to update note as needed.    Ashley , MSN, AGACNP-BC [facility] See Amion for schedule and pager information 02/15/2022 3:35 PM   Electronically signed: Dr. Kerney Elbe

## 2022-02-16 DIAGNOSIS — A8100 Creutzfeldt-Jakob disease, unspecified: Principal | ICD-10-CM

## 2022-02-16 DIAGNOSIS — H5347 Heteronymous bilateral field defects: Secondary | ICD-10-CM | POA: Diagnosis not present

## 2022-02-16 DIAGNOSIS — I609 Nontraumatic subarachnoid hemorrhage, unspecified: Secondary | ICD-10-CM | POA: Diagnosis not present

## 2022-02-16 DIAGNOSIS — R270 Ataxia, unspecified: Secondary | ICD-10-CM | POA: Diagnosis not present

## 2022-02-16 LAB — GLUCOSE, CAPILLARY
Glucose-Capillary: 119 mg/dL — ABNORMAL HIGH (ref 70–99)
Glucose-Capillary: 120 mg/dL — ABNORMAL HIGH (ref 70–99)
Glucose-Capillary: 214 mg/dL — ABNORMAL HIGH (ref 70–99)
Glucose-Capillary: 186 mg/dL — ABNORMAL HIGH (ref 70–99)
Glucose-Capillary: 153 mg/dL — ABNORMAL HIGH (ref 70–99)

## 2022-02-16 MED ORDER — IMMUNE GLOBULIN (HUMAN) 10 GM/100ML IV SOLN
400.0000 mg/kg | INTRAVENOUS | Status: AC
  Administered 2022-02-16 – 2022-02-19 (×4): 40 g via INTRAVENOUS
  Filled 2022-02-16 (×6): qty 400

## 2022-02-16 NOTE — Progress Notes (Signed)
Adding IVIG 437m IV q24 x5 per Dr. 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   MOnnie Boer PharmD, BCIDP, AAHIVP, CPP Infectious Disease Pharmacist 02/16/2022 2:32 PM

## 2022-02-16 NOTE — Progress Notes (Signed)
Mobility Specialist: Progress Note   02/16/22 1339  Mobility  Activity Stood at bedside  Level of Assistance Moderate assist, patient does 50-74%  Assistive Device Front wheel walker  Activity Response Tolerated fair  Mobility Referral Yes  $Mobility charge 1 Mobility   Post-Mobility: 100 HR  Pt received in the bed an agreeable to mobility. MinA with bed mobility and modA to stand. Worked on marching in place at [employer] for balance. Pt assisted back to bed after session with call bell in reach.   [place] Monic Engelmann Mobility Specialist Please contact via SecureChat or Rehab office at [phone]

## 2022-02-16 NOTE — Progress Notes (Signed)
I responded to patient yelling out "I want to be a cadaver". Patient was tearful and expressing his feelings. He shared that if he were to pass from his illness, he wants his body to be donated for research. I told him that I would let his treatment team and family know. Patient expressed relief and gratefulness. He shared that he is not scared. He just wants to make a difference for other people.

## 2022-02-16 NOTE — Plan of Care (Signed)
Problem: Education: Goal: Ability to describe self-care measures that may prevent or decrease complications (Diabetes Survival Skills Education) will improve Outcome: Progressing   Problem: Coping: Goal: Ability to adjust to condition or change in health will improve Outcome: Progressing   Problem: Fluid Volume: Goal: Ability to maintain a balanced intake and output will improve Outcome: Progressing   Problem: Health Behavior/Discharge Planning: Goal: Ability to identify and utilize available resources and services will improve Outcome: Progressing Goal: Ability to manage health-related needs will improve Outcome: Progressing   Problem: Metabolic: Goal: Ability to maintain appropriate glucose levels will improve Outcome: Progressing   Problem: Nutritional: Goal: Maintenance of adequate nutrition will improve Outcome: Progressing Goal: Progress toward achieving an optimal weight will improve Outcome: Progressing   Problem: Skin Integrity: Goal: Risk for impaired skin integrity will decrease Outcome: Progressing   Problem: Tissue Perfusion: Goal: Adequacy of tissue perfusion will improve Outcome: Progressing   Problem: Education: Goal: Knowledge of disease or condition will improve Outcome: Progressing Goal: Knowledge of secondary prevention will improve (MUST DOCUMENT ALL) Outcome: Progressing Goal: Knowledge of patient specific risk factors will improve [place] N/A or DELETE if not current risk factor) Outcome: Progressing   Problem: Ischemic Stroke/TIA Tissue Perfusion: Goal: Complications of ischemic stroke/TIA will be minimized Outcome: Progressing   Problem: Coping: Goal: Will verbalize positive feelings about self Outcome: Progressing Goal: Will identify appropriate support needs Outcome: Progressing   Problem: Health Behavior/Discharge Planning: Goal: Ability to manage health-related needs will improve Outcome: Progressing Goal: Goals will be  collaboratively established with patient/family Outcome: Progressing   Problem: Self-Care: Goal: Ability to participate in self-care as condition permits will improve Outcome: Progressing Goal: Verbalization of feelings and concerns over difficulty with self-care will improve Outcome: Progressing Goal: Ability to communicate needs accurately will improve Outcome: Progressing   Problem: Nutrition: Goal: Risk of aspiration will decrease Outcome: Progressing Goal: Dietary intake will improve Outcome: Progressing   Problem: Education: Goal: Knowledge of General Education information will improve Description: Including pain rating scale, medication(s)/side effects and non-pharmacologic comfort measures Outcome: Progressing   Problem: Health Behavior/Discharge Planning: Goal: Ability to manage health-related needs will improve Outcome: Progressing   Problem: Clinical Measurements: Goal: Ability to maintain clinical measurements within normal limits will improve Outcome: Progressing Goal: Will remain free from infection Outcome: Progressing Goal: Diagnostic test results will improve Outcome: Progressing Goal: Respiratory complications will improve Outcome: Progressing Goal: Cardiovascular complication will be avoided Outcome: Progressing   Problem: Activity: Goal: Risk for activity intolerance will decrease Outcome: Progressing   Problem: Nutrition: Goal: Adequate nutrition will be maintained Outcome: Progressing   Problem: Coping: Goal: Level of anxiety will decrease Outcome: Progressing   Problem: Elimination: Goal: Will not experience complications related to bowel motility Outcome: Progressing Goal: Will not experience complications related to urinary retention Outcome: Progressing   Problem: Education: Goal: Knowledge of disease or condition will improve Outcome: Progressing Goal: Knowledge of secondary prevention will improve (MUST DOCUMENT ALL) Outcome:  Progressing Goal: Knowledge of patient specific risk factors will improve [place] N/A or DELETE if not current risk factor) Outcome: Progressing   Problem: Ischemic Stroke/TIA Tissue Perfusion: Goal: Complications of ischemic stroke/TIA will be minimized Outcome: Progressing   Problem: Coping: Goal: Will verbalize positive feelings about self Outcome: Progressing Goal: Will identify appropriate support needs Outcome: Progressing   Problem: Health Behavior/Discharge Planning: Goal: Ability to manage health-related needs will improve Outcome: Progressing Goal: Goals will be collaboratively established with patient/family Outcome: Progressing   Problem: Self-Care: Goal: Ability to participate in  self-care as condition permits will improve Outcome: Progressing Goal: Verbalization of feelings and concerns over difficulty with self-care will improve Outcome: Progressing Goal: Ability to communicate needs accurately will improve Outcome: Progressing   Problem: Nutrition: Goal: Risk of aspiration will decrease Outcome: Progressing Goal: Dietary intake will improve Outcome: Progressing

## 2022-02-16 NOTE — Progress Notes (Signed)
Occupational Therapy Treatment Patient Details Name: Steve Richards MRN: [id number] DOB: [birth year] Today's Date: 02/16/2022   History of present illness 56 y.o. male adm 12/7 with PMH significant for hepatic cirrhosis, morbid obesity, aortic valve replacement with bioprosthetic valve, who presentd with word finding difficulty, tremor and vision abnormalities.  MRI: Small 14 mm focus suspicious for subarachnoid hemorrhage at the right vertex adjacent to the falx, "largely unremarkable MRI" Per neuro consult, "Differential includes autoimmune, paraneoplastic etiology vs could be CJD or infectious."   OT comments  Patient states he believes his vision and UE tremors are getting worse. Patient seen for grooming and self feeding with patient requiring hand over hand assist to feed self and manage cup.  Patient with difficulty holding washcloth to wash face. Patient would benefit from further OT to address adaptive equipment for self feeding and continued ADL retraining. Acute OT to continue to follow.    Recommendations for follow up therapy are one component of a multi-disciplinary discharge planning process, led by the attending physician.  Recommendations may be updated based on patient status, additional functional criteria and insurance authorization.    Follow Up Recommendations  Acute inpatient rehab (3hours/day)     Assistance Recommended at Discharge Frequent or constant Supervision/Assistance  Patient can return home with the following  Assist for transportation;Assistance with cooking/housework;Help with stairs or ramp for entrance;A lot of help with walking and/or transfers;A lot of help with bathing/dressing/bathroom   Equipment Recommendations  Tub/shower bench    Recommendations for Other Services      Precautions / Restrictions Precautions Precautions: Fall;Other (comment) Precaution Comments: In hospital fall 12/13, imparied vision Restrictions Weight Bearing Restrictions: No        Mobility Bed Mobility Overal bed mobility: Needs Assistance Bed Mobility: Supine to Sit     Supine to sit: Mod assist, HOB elevated     General bed mobility comments: Assist to elevate trunk into sitting    Transfers Overall transfer level: Needs assistance Equipment used: Rolling walker (2 wheels) Transfers: Sit to/from Stand, Bed to chair/wheelchair/BSC Sit to Stand: Mod assist     Step pivot transfers: Mod assist     General transfer comment: posterior leaning while sitting and standing requiring mod assist for transfer. Patient required verbal cues for hand placement and safety     Balance Overall balance assessment: Needs assistance Sitting-balance support: Feet supported Sitting balance-Leahy Scale: Fair   Postural control: Posterior lean Standing balance support: Bilateral upper extremity supported Standing balance-Leahy Scale: Poor Standing balance comment: mod assist for balance when standing                           ADL either performed or assessed with clinical judgement   ADL Overall ADL's : Needs assistance/impaired Eating/Feeding: Maximal assistance;Sitting Eating/Feeding Details (indicate cue type and reason): unable to hold onto utensils or cups due to tremors and poor grip strength Grooming: Wash/dry hands;Wash/dry face;Minimal assistance;Sitting Grooming Details (indicate cue type and reason): min assist to complete and assistance to hold washcloth                               General ADL Comments: focused on self feeding and grooming seated in recliner    Extremity/Trunk Assessment Upper Extremity Assessment RUE Deficits / Details: tremors more to his hands, RUE Sensation: WNL RUE Coordination: decreased fine motor;decreased gross motor LUE Deficits / Details: tremors more to  his hands LUE Sensation: WNL LUE Coordination: decreased fine motor;decreased gross motor            Vision       Perception      Praxis      Cognition Arousal/Alertness: Awake/alert Behavior During Therapy: Flat affect Overall Cognitive Status: Impaired/Different from baseline Area of Impairment: Problem solving, Safety/judgement, Awareness, Following commands                       Following Commands: Follows one step commands with increased time, Follows one step commands inconsistently Safety/Judgement: Decreased awareness of safety, Decreased awareness of deficits Awareness: Intellectual Problem Solving: Slow processing, Difficulty sequencing, Requires verbal cues, Requires tactile cues General Comments: difficulty with word finding and orientation        Exercises      Shoulder Instructions       General Comments Patient states he believes his vision and tremors are getting worse    Pertinent Vitals/ Pain          Home Living                                          Prior Functioning/Environment              Frequency  Min 2X/week        Progress Toward Goals  OT Goals(current goals can now be found in the care plan section)  Progress towards OT goals: Not progressing toward goals - comment (increased tremors making self care tasks difficult)  Acute Rehab OT Goals Patient Stated Goal: get better OT Goal Formulation: With patient Time For Goal Achievement: 02/23/22 Potential to Achieve Goals: Good ADL Goals Pt Will Perform Grooming: with supervision;standing Pt Will Perform Lower Body Dressing: with supervision;sit to/from stand Pt Will Transfer to Toilet: with supervision;ambulating;regular height toilet  Plan Discharge plan remains appropriate    Co-evaluation                 AM-PAC OT "6 Clicks" Daily Activity     Outcome Measure   Help from another person eating meals?: A Lot Help from another person taking care of personal grooming?: A Little Help from another person toileting, which includes using toliet, bedpan, or urinal?: A  Little Help from another person bathing (including washing, rinsing, drying)?: A Lot Help from another person to put on and taking off regular upper body clothing?: A Little Help from another person to put on and taking off regular lower body clothing?: A Lot 6 Click Score: 15    End of Session Equipment Utilized During Treatment: Gait belt;Rolling walker (2 wheels)  OT Visit Diagnosis: Unsteadiness on feet (R26.81);Ataxia, unspecified (R27.0);Low vision, both eyes (H54.2)   Activity Tolerance Patient tolerated treatment well   Patient Left in chair;with call bell/phone within reach;with chair alarm set   Nurse Communication Mobility status        Time: [id number] OT Time Calculation (min): 39 min  Charges: OT General Charges $OT Visit: 1 Visit OT Treatments $Self Care/Home Management : 38-52 mins  Lodema Hong, [place]  Office [place] 02/16/2022, 10:14 AM

## 2022-02-16 NOTE — Progress Notes (Signed)
SLP Cancellation Note  Patient Details Name: Steve Richards MRN: [id number] DOB: [birth year]   Cancelled treatment:       Reason Eval/Treat Not Completed: Patient at procedure or test/unavailable;pt currently with PT when ST tx session attempted.  ST will continue efforts.   Elvina Sidle, M.S., CCC-SLP 02/16/2022, 3:14 PM

## 2022-02-16 NOTE — Progress Notes (Signed)
PROGRESS NOTE                                                                                                                                                                                                             Patient Demographics:    Steve Richards, is a 56 y.o. male, DOB - 19-Aug-1965, XKG:[id number]  Outpatient Primary MD for the patient is Girtha Rm, NP-C    LOS - 7  Admit date - 02/08/2022    Chief Complaint  Patient presents with   Stroke Symptoms       Brief Narrative (HPI from H&P)      56 y.o. male with PMH significant for hepatic cirrhosis, morbid obesity, aortic valve replacement with bioprosthetic valve, who presents with word finding difficulty, tremor and vision abnormalities.  He has been having some weakness in his left hand for the past few months had some outpatient neurological workup initially few months ago but could not remain compliant with it.  For the last few weeks he has been having some tremor-like activity in his right hand, he has developed right eye visual problem for which she saw an ophthalmologist and was noted to find right-sided hemianopsia, he also has noticed some problems finding words.   Presented to the ER where MRI was concerning for subtle cortical ribboning suspicious for autoimmune, paraneoplastic etiology vs could be CJD or infectious.  He was seen by neurologist and admitted to the hospital.    Subjective:    Denies any change in his deficits, denies nausea, vomiting, chest pain or shortness of breath.   Assessment  & Plan :   L. hand weakness presumably from ? pinched nerve in summer 2023 who presents with expressive aphasia, ataxia and tremor with right worse than left and R hemianopsia that has progressively worsened since 01/18/22.  -  Note symptoms ongoing since summer 2023, patient not compliant with outpatient neurological workup, EEG stable -  CT head and  MRI noted findings suspicious for  autoimmune, paraneoplastic etiology vs could be CJD or infectious.   -Input greatly appreciated, meningitis/encephalitis panel is negative, RT quick is pending/autoimmune panel . -Repeat MRI 12/14 showing improving SDH, but concerning for worsening cortical ribboning and left parietal lobe, and there is spreading to the right parietal lobe . 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 -CTA head and neck with no significant large vessel stenosis or occlusion  HX of bioprosthetic AVR.   - Monitor with supportive care.  Stable prosthesis in aortic valve physiology on last echo few months ago.  Chronic diastolic CHF EF of 34% on echo done few months ago.   -Compensated.   Obesity.   - BMI of 35.  Follow-up with PCP.  History of cirrhosis of liver.  Question NASH.   - Stable INR and LFTs, outpatient GI workup.    Polycythemia.   - Likely due to OSA, monitor.  Small stable subarachnoid hemorrhage noted on MRI.   - Symptom-free.  Monitor.  Holding aspirin or anticoagulation for now.  Hypothyroidism.   - Home dose Synthroid increased, repeat TSH and free T4 in 3 to 4 weeks.  DM type II.  SSI.  Lab Results  Component Value Date   HGBA1C 6.2 (H) 02/10/2022   CBG (last 3)  Recent Labs    02/15/22 2112 02/16/22 0818 02/16/22 1217  GLUCAP 198* 119* 120*        Condition - Fair  Family Communication  :  None at bedside, discussed with daughter by phone  Code Status :  Full  Consults  :  Neuro  PUD Prophylaxis :    Procedures  :     LP 02/09/22  MRI - 1. Small 14 mm focus suspicious for subarachnoid hemorrhage at the right vertex adjacent to the falx. This was subtle on the earlier CT, not  apparent on MRI last month. Etiology and significance is unclear, has there been recent fall? No other acute intracranial hemorrhage is identified, but there is a small number of nonspecific chronic microhemorrhages in both hemispheres. 2. Otherwise a largely unremarkable MRI appearance of the brain, HOWEVER, suspicion of subtle abnormal cortical diffusion widely scattered in the left hemisphere. Although with no cortical edema or abnormal signal on the remaining sequences. No enhancement. Given the clinical suspicion Creutzfeldt-Jakob Disease cannot be excluded. Differential diagnosis would include autoimmune or paraneoplastic encephalitis       Disposition Plan  :    Status is: Inpatient  DVT Prophylaxis  :  Place and maintain sequential compression device Start: 02/15/22 0704 Place and maintain sequential compression device Start: 02/09/22 1757   Lab Results  Component Value Date   PLT 130 (L) 02/15/2022    Diet :  Diet Order             Diet regular Room service appropriate? Yes with Assist; Fluid consistency: Thin  Diet effective now                    Inpatient Medications  Scheduled Meds:  docusate sodium  200 mg Oral BID   insulin aspart  0-15 Units Subcutaneous TID WC   insulin aspart  0-5 Units Subcutaneous QHS   levothyroxine  100 mcg Oral Daily   multivitamin with minerals  1 tablet Oral Daily   pantoprazole  40 mg Oral Daily   polyethylene glycol  17 g Oral BID   Continuous Infusions:   PRN Meds:.acetaminophen **OR** acetaminophen (TYLENOL) oral liquid 160 mg/5 mL **OR** acetaminophen, senna-docusate  Antibiotics  :    Anti-infectives (From admission, onward)    None  Objective:   Vitals:   02/15/22 2314 02/16/22 0340 02/16/22 0800 02/16/22 1217  BP: (!) 144/99 (!) 140/90 (!) 165/97 (!) 148/99  Pulse: 67 66 67 90  Resp: 17 13 18 13   Temp: 97.7 F (36.5 C) 98 F (36.7 C) 98 F (36.7 C) 98 F (36.7 C)  TempSrc: Oral Oral Oral Oral   SpO2: 96% 98%    Weight:      Height:        Wt Readings from Last 3 Encounters:  02/09/22 120 kg  02/08/22 118.4 kg  01/31/22 118.4 kg     Intake/Output Summary (Last 24 hours) at 02/16/2022 1429 Last data filed at 02/16/2022 1055 Gross per 24 hour  Intake --  Output 1350 ml  Net -1350 ml       Physical Exam   Alert, with mild aphasia, right hemianopsia, extremity dyskinesia, left > right Symmetrical Chest wall movement, Good air movement bilaterally, CTAB RRR,No Gallops,Rubs or new Murmurs, No Parasternal Heave +ve B.Sounds, Abd Soft, No tenderness, No rebound - guarding or rigidity. No Cyanosis, Clubbing or edema, No new Rash or bruise        Data Review:    Recent Labs  Lab 02/11/22 0210 02/12/22 0310 02/13/22 0321 02/14/22 0516 02/15/22 0421  WBC 6.2 13.4* 10.9* 8.2 8.3  HGB 17.0 17.2* 16.3 16.6 16.9  HCT 50.0 50.8 49.3 51.0 50.2  PLT 176 195 149* 158 130*  MCV 88.0 89.0 89.8 89.3 87.9  MCH 29.9 30.1 29.7 29.1 29.6  MCHC 34.0 33.9 33.1 32.5 33.7  RDW 12.9 13.2 13.2 13.3 13.2  LYMPHSABS 0.4* 0.5* 0.5* 0.3* 0.3*  MONOABS 0.0* 0.2 0.1 0.1 0.2  EOSABS 0.0 0.0 0.0 0.0 0.0  BASOSABS 0.0 0.0 0.0 0.0 0.0    Recent Labs  Lab 02/10/22 0616 02/11/22 0210 02/12/22 0310 02/13/22 0321 02/14/22 0516 02/15/22 0956  NA  --  136 140 139 137 137  K  --  4.3 4.1 4.8 3.8 4.2  CL  --  104 105 103 102 98  CO2  --  22 24 25 25 26   GLUCOSE  --  231* 165* 178* 177* 198*  BUN  --  10 12 15 14 14   CREATININE  --  0.91 0.98 0.90 0.91 1.00  AST  --  40 36 35 32 42*  ALT  --  32 32 35 37 42  ALKPHOS  --  51 49 47 47 49  BILITOT  --  0.9 0.7 0.7 0.9 0.7  ALBUMIN  --  3.9 3.9 3.7 3.8 3.9  CRP <0.5 0.5 0.5 <0.5 <0.5  --   TSH 9.391*  --   --   --   --   --   HGBA1C 6.2*  --   --   --   --   --   BNP  --  52.9 34.2 433.0* 187.6*  --   MG  --  2.0 2.1 2.4 2.2  --   CALCIUM  --  9.3 9.2 8.7* 8.5* 9.1    ----------------------------------------------------------------------------------------------------------------- No results for input(s): "CHOL", "HDL", "LDLCALC", "TRIG", "CHOLHDL", "LDLDIRECT" in the last 72 hours.   Lab Results  Component Value Date   HGBA1C 6.2 (H) 02/10/2022    No results for input(s): "TSH", "T4TOTAL", "T3FREE", "THYROIDAB" in the last 72 hours.  Invalid input(s): "FREET3"     Radiology Reports MR BRAIN W WO CONTRAST  Result Date: 02/15/2022 CLINICAL DATA:  Neuro deficit, neurodegenerative disorder suspected. Possible CJD  versus autoimmune encephalopathy EXAM: MRI HEAD WITHOUT AND WITH CONTRAST TECHNIQUE: Multiplanar, multiecho pulse sequences of the brain and surrounding structures were obtained without and with intravenous contrast. CONTRAST:  69m GADAVIST GADOBUTROL 1 MMOL/ML IV SOLN COMPARISON:  MRI head 02/09/2022, MRI 01/30/2022 FINDINGS: Brain: Again noted is subtle diffusion abnormality mostly in the left parietal lobe with cortical hyperintensity on DWI which shows restricted diffusion on ADC map. This may have progressed from the prior studies. This is asymmetric. There may be some early similar findings in the right parietal lobe. Enhancement in this area is normal. No underlying mass or infarct. Normal diffusion-weighted imaging in the brainstem and basal ganglia. Improvement in subarachnoid hemorrhage over the right frontal convexity, presumably posttraumatic. Scattered areas of chronic microhemorrhage in both cerebral hemispheres stable. This is mild. No fluid collection. Ventricle size normal. No midline shift. Vascular: Normal arterial flow voids Skull and upper cervical spine: No focal skeletal lesion. Sinuses/Orbits: Mild mucosal edema paranasal sinuses. Negative orbit Other: None IMPRESSION: 1. Subtle diffusion abnormality mostly in the left parietal lobe. This may have progressed from the prior studies. There may be some early similar findings in  the right parietal lobe. No underlying mass or infarct. Differential diagnosis includes DJD but nonspecific. Correlate with lumbar puncture. 2. Improvement in subarachnoid hemorrhage over the right frontal convexity. 3. Scattered areas of chronic microhemorrhage in both cerebral hemispheres stable from the prior study. This may be due to chronic hypertension. Electronically Signed   By: CFranchot GalloM.D.   On: 02/15/2022 15:23   CT ANGIO HEAD W OR WO CONTRAST  Result Date: 02/14/2022 CLINICAL DATA:  Subarachnoid hemorrhage EXAM: CT ANGIOGRAPHY HEAD TECHNIQUE: Multidetector CT imaging of the head was performed using the standard protocol during bolus administration of intravenous contrast. Multiplanar CT image reconstructions and MIPs were obtained to evaluate the vascular anatomy. RADIATION DOSE REDUCTION: This exam was performed according to the departmental dose-optimization program which includes automated exposure control, adjustment of the mA and/or kV according to patient size and/or use of iterative reconstruction technique. CONTRAST:  767mOMNIPAQUE IOHEXOL 350 MG/ML SOLN COMPARISON:  None Available. FINDINGS: CT HEAD Brain: There is no mass, hemorrhage or extra-axial collection. The size and configuration of the ventricles and extra-axial CSF spaces are normal. The brain parenchyma is normal, without acute or chronic infarction. Previously seen superior right parafalcine hyperdensity is no longer visible. Vascular: No abnormal hyperdensity of the major intracranial arteries or dural venous sinuses. No intracranial atherosclerosis. Skull: The visualized skull base, calvarium and extracranial soft tissues are normal. Sinuses/Orbits: No fluid levels or advanced mucosal thickening of the visualized paranasal sinuses. No mastoid or middle ear effusion. The orbits are normal. CTA HEAD POSTERIOR CIRCULATION: --Vertebral arteries: Normal --Inferior cerebellar arteries: Normal. --Basilar artery: Normal.  --Superior cerebellar arteries: Normal. --Posterior cerebral arteries: Normal. ANTERIOR CIRCULATION: --Intracranial internal carotid arteries: Normal. --Anterior cerebral arteries (ACA): Normal. --Middle cerebral arteries (MCA): Normal. ANATOMIC VARIANTS: None Review of the MIP images confirms the above findings. IMPRESSION: Normal CTA of the head.  No acute subarachnoid hemorrhage. Electronically Signed   By: KeUlyses Jarred.D.   On: 02/14/2022 21:27      Signature  -   DaEmeline Ginslgergawy M.D on 02/16/2022 at 2:29 PM   -  To page go to [email or web address]

## 2022-02-17 DIAGNOSIS — R2990 Unspecified symptoms and signs involving the nervous system: Secondary | ICD-10-CM | POA: Diagnosis not present

## 2022-02-17 DIAGNOSIS — H5347 Heteronymous bilateral field defects: Secondary | ICD-10-CM | POA: Diagnosis not present

## 2022-02-17 LAB — GLUCOSE, CAPILLARY
Glucose-Capillary: 151 mg/dL — ABNORMAL HIGH (ref 70–99)
Glucose-Capillary: 215 mg/dL — ABNORMAL HIGH (ref 70–99)
Glucose-Capillary: 217 mg/dL — ABNORMAL HIGH (ref 70–99)
Glucose-Capillary: 174 mg/dL — ABNORMAL HIGH (ref 70–99)
Glucose-Capillary: 209 mg/dL — ABNORMAL HIGH (ref 70–99)

## 2022-02-17 NOTE — Progress Notes (Signed)
Subjective: Sitting in chair by the window during assessment this morning.   Objective: Current vital signs: BP (!) 148/100 (BP Location: Left Arm)   Pulse 75   Temp 98.1 F (36.7 C) (Oral)   Resp 15   Ht 6' (1.829 m)   Wt 120 kg   SpO2 96%   BMI 35.88 kg/m  Vital signs in last 24 hours: Temp:  [98 F (36.7 C)-98.2 F (36.8 C)] 98.1 F (36.7 C) (12/15 2130) Pulse Rate:  [66-93] 75 (12/16 0000) Resp:  [13-24] 15 (12/16 0000) BP: (135-187)/(78-113) 148/100 (12/16 0000) SpO2:  [91 %-98 %] 96 % (12/16 0000)  Intake/Output from previous day: 12/15 0701 - 12/16 0700 In: 240 [P.O.:240] Out: 1200 [Urine:1200] Intake/Output this shift: Total I/O In: 240 [P.O.:240] Out: -  Nutritional status:  Diet Order             Diet regular Room service appropriate? Yes with Assist; Fluid consistency: Thin  Diet effective now                   Neurologic Exam: Mental status/Cognition: Patient is alert and oriented to person, place time and situation, however he is unable to state which hospital he is and or where the hospital is.  He is able to follow simple one and two-step commands. Speech/language: Speech has a stuttering pattern with some dysarthria and word finding difficulties. Frequently loses his train of thought requiring repetition of several questions. Intact memory for some remote events, but with significant delays in answering.  Cranial nerves:   CN II Pupils equal and reactive to light. Cortically blind   CN III,IV,VI EOM are full but with roving and irregular spontaneous movements during exam, no gaze preference or deviation, no nystagmus    CN V normal sensation in V1, V2, and V3 segments bilaterally    CN VII no asymmetry, no nasolabial fold flattening    CN VIII normal hearing to speech    CN IX & X Mild hypophonia   CN XI 5/5 shoulder shrug bilaterally    CN XII midline tongue protrusion     Motor: Frequent coarse tremors seen in bilateral upper extremities.  Able to move all extremities with good antigravity strength. Against resistance, variable strength is noted in conjunction with involuntary movements, 4-5/5 in all 4 extremities without asymmetry  Sensation: Intact to light touch bilaterally  Coordination/Complex Motor: Dysmetric movements BUE.   Gait: deferred.   Lab Results: Results for orders placed or performed during the hospital encounter of 02/08/22 (from the past 48 hour(s))  CBC with Differential/Platelet     Status: Abnormal   Collection Time: 02/15/22  4:21 AM  Result Value Ref Range   WBC 8.3 4.0 - 10.5 K/uL   RBC 5.71 4.22 - 5.81 MIL/uL   Hemoglobin 16.9 13.0 - 17.0 g/dL   HCT 50.2 39.0 - 52.0 %   MCV 87.9 80.0 - 100.0 fL   MCH 29.6 26.0 - 34.0 pg   MCHC 33.7 30.0 - 36.0 g/dL   RDW 13.2 11.5 - 15.5 %   Platelets 130 (L) 150 - 400 K/uL   nRBC 0.0 0.0 - 0.2 %   Neutrophils Relative % 92 %   Neutro Abs 7.7 1.7 - 7.7 K/uL   Lymphocytes Relative 4 %   Lymphs Abs 0.3 (L) 0.7 - 4.0 K/uL   Monocytes Relative 3 %   Monocytes Absolute 0.2 0.1 - 1.0 K/uL   Eosinophils Relative 0 %   Eosinophils  Absolute 0.0 0.0 - 0.5 K/uL   Basophils Relative 0 %   Basophils Absolute 0.0 0.0 - 0.1 K/uL   Immature Granulocytes 1 %   Abs Immature Granulocytes 0.04 0.00 - 0.07 K/uL    Comment: Performed at [facility], [place] [address], [place], [place] [address]  Glucose, capillary     Status: Abnormal   Collection Time: 02/15/22  8:24 AM  Result Value Ref Range   Glucose-Capillary 163 (H) 70 - 99 mg/dL    Comment: Glucose reference range applies only to samples taken after fasting for at least 8 hours.  Comprehensive metabolic panel     Status: Abnormal   Collection Time: 02/15/22  9:56 AM  Result Value Ref Range   Sodium 137 135 - 145 mmol/L   Potassium 4.2 3.5 - 5.1 mmol/L   Chloride 98 98 - 111 mmol/L   CO2 26 22 - 32 mmol/L   Glucose, Bld 198 (H) 70 - 99 mg/dL    Comment: Glucose reference range applies only to samples taken  after fasting for at least 8 hours.   BUN 14 6 - 20 mg/dL   Creatinine, Ser 1.00 0.61 - 1.24 mg/dL   Calcium 9.1 8.9 - 10.3 mg/dL   Total Protein 6.9 6.5 - 8.1 g/dL   Albumin 3.9 3.5 - 5.0 g/dL   AST 42 (H) 15 - 41 U/L   ALT 42 0 - 44 U/L   Alkaline Phosphatase 49 38 - 126 U/L   Total Bilirubin 0.7 0.3 - 1.2 mg/dL   GFR, Estimated >60 >60 mL/min    Comment: (NOTE) Calculated using the CKD-EPI Creatinine Equation (2021)    Anion gap 13 5 - 15    Comment: Performed at [facility] [address] [place], [place] [address]  Glucose, capillary     Status: Abnormal   Collection Time: 02/15/22 11:04 AM  Result Value Ref Range   Glucose-Capillary 208 (H) 70 - 99 mg/dL    Comment: Glucose reference range applies only to samples taken after fasting for at least 8 hours.  Glucose, capillary     Status: Abnormal   Collection Time: 02/15/22  4:24 PM  Result Value Ref Range   Glucose-Capillary 217 (H) 70 - 99 mg/dL    Comment: Glucose reference range applies only to samples taken after fasting for at least 8 hours.  Glucose, capillary     Status: Abnormal   Collection Time: 02/15/22  9:12 PM  Result Value Ref Range   Glucose-Capillary 198 (H) 70 - 99 mg/dL    Comment: Glucose reference range applies only to samples taken after fasting for at least 8 hours.  Glucose, capillary     Status: Abnormal   Collection Time: 02/16/22  8:18 AM  Result Value Ref Range   Glucose-Capillary 119 (H) 70 - 99 mg/dL    Comment: Glucose reference range applies only to samples taken after fasting for at least 8 hours.  Glucose, capillary     Status: Abnormal   Collection Time: 02/16/22 12:17 PM  Result Value Ref Range   Glucose-Capillary 120 (H) 70 - 99 mg/dL    Comment: Glucose reference range applies only to samples taken after fasting for at least 8 hours.  Glucose, capillary     Status: Abnormal   Collection Time: 02/16/22  4:38 PM  Result Value Ref Range   Glucose-Capillary 214 (H) 70 - 99  mg/dL    Comment: Glucose reference range applies only  to samples taken after fasting for at least 8 hours.  Glucose, capillary     Status: Abnormal   Collection Time: 02/16/22  6:25 PM  Result Value Ref Range   Glucose-Capillary 186 (H) 70 - 99 mg/dL    Comment: Glucose reference range applies only to samples taken after fasting for at least 8 hours.  Glucose, capillary     Status: Abnormal   Collection Time: 02/16/22  8:57 PM  Result Value Ref Range   Glucose-Capillary 153 (H) 70 - 99 mg/dL    Comment: Glucose reference range applies only to samples taken after fasting for at least 8 hours.    Recent Results (from the past 240 hour(s))  CSF culture w Gram Stain     Status: None   Collection Time: 02/09/22  6:05 AM   Specimen: CSF; Cerebrospinal Fluid  Result Value Ref Range Status   Specimen Description CSF  Final   Special Requests NONE  Final   Gram Stain   Final    WBC PRESENT, PREDOMINANTLY MONONUCLEAR NO ORGANISMS SEEN CYTOSPIN SMEAR    Culture   Final    NO GROWTH Performed at [facility], [address] [place], [place] [address]    Report Status 02/12/2022 FINAL  Final    Lipid Panel No results for input(s): "CHOL", "TRIG", "HDL", "CHOLHDL", "VLDL", "LDLCALC" in the last 72 hours.  Studies/Results: MR BRAIN W WO CONTRAST  Result Date: 02/15/2022 CLINICAL DATA:  Neuro deficit, neurodegenerative disorder suspected. 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 CONTRAST:  61m GADAVIST GADOBUTROL 1 MMOL/ML IV SOLN COMPARISON:  MRI head 02/09/2022, MRI 01/30/2022 FINDINGS: Brain: Again noted is subtle diffusion abnormality mostly in the left parietal lobe with cortical hyperintensity on DWI which shows restricted diffusion on ADC map. This may have progressed from the prior studies.  This is asymmetric. There may be some early similar findings in the right parietal lobe. Enhancement in this area is normal. No underlying mass or infarct. Normal diffusion-weighted imaging in the brainstem and basal ganglia. Improvement in subarachnoid hemorrhage over the right frontal convexity, presumably posttraumatic. Scattered areas of chronic microhemorrhage in both cerebral hemispheres stable. This is mild. No fluid collection. Ventricle size normal. No midline shift. Vascular: Normal arterial flow voids Skull and upper cervical spine: No focal skeletal lesion. Sinuses/Orbits: Mild mucosal edema paranasal sinuses. Negative orbit Other: None IMPRESSION: 1. Subtle diffusion abnormality mostly in the left parietal lobe. This may have progressed from the prior studies. There may be some early similar findings in the right parietal lobe. No underlying mass or infarct. Differential diagnosis includes DJD but nonspecific. Correlate with lumbar puncture. 2. Improvement in subarachnoid hemorrhage over the right frontal convexity. 3. Scattered areas of chronic microhemorrhage in both cerebral hemispheres stable from the prior study. This may be due to chronic hypertension. Electronically Signed   By: CFranchot GalloM.D.   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 LP negative for infection, RT-quic is still pending. Meningitis/encephalitis panel is negative. - CTA of head: Normal CTA of the head.  No acute subarachnoid hemorrhage.  - EEG reveals no seizure activity.  - DDx: Possible autoimmune process versus CJD causing right-sided tremor, dyskinesias, cortical blindness, progressive expressive aphasia, progressive cognitive decline including impairment of working and short term memory, impaired executive functioning, and ataxia. No hyperekplexia noted on today's exam.   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 Kerney Elbe 02/17/2022  12:11 AM

## 2022-02-17 NOTE — Plan of Care (Signed)
Problem: Education: Goal: Ability to describe self-care measures that may prevent or decrease complications (Diabetes Survival Skills Education) will improve Outcome: Progressing   Problem: Coping: Goal: Ability to adjust to condition or change in health will improve Outcome: Progressing   Problem: Fluid Volume: Goal: Ability to maintain a balanced intake and output will improve Outcome: Progressing   Problem: Health Behavior/Discharge Planning: Goal: Ability to identify and utilize available resources and services will improve Outcome: Progressing Goal: Ability to manage health-related needs will improve Outcome: Progressing   Problem: Metabolic: Goal: Ability to maintain appropriate glucose levels will improve Outcome: Progressing   Problem: Nutritional: Goal: Maintenance of adequate nutrition will improve Outcome: Progressing Goal: Progress toward achieving an optimal weight will improve Outcome: Progressing   Problem: Skin Integrity: Goal: Risk for impaired skin integrity will decrease Outcome: Progressing   Problem: Tissue Perfusion: Goal: Adequacy of tissue perfusion will improve Outcome: Progressing   Problem: Education: Goal: Knowledge of disease or condition will improve Outcome: Progressing Goal: Knowledge of secondary prevention will improve (MUST DOCUMENT ALL) Outcome: Progressing Goal: Knowledge of patient specific risk factors will improve Steve Richards N/A or DELETE if not current risk factor) Outcome: Progressing   Problem: Ischemic Stroke/TIA Tissue Perfusion: Goal: Complications of ischemic stroke/TIA will be minimized Outcome: Progressing   Problem: Coping: Goal: Will verbalize positive feelings about self Outcome: Progressing Goal: Will identify appropriate support needs Outcome: Progressing   Problem: Health Behavior/Discharge Planning: Goal: Ability to manage health-related needs will improve Outcome: Progressing Goal: Goals will be  collaboratively established with patient/family Outcome: Progressing   Problem: Self-Care: Goal: Ability to participate in self-care as condition permits will improve Outcome: Progressing Goal: Verbalization of feelings and concerns over difficulty with self-care will improve Outcome: Progressing Goal: Ability to communicate needs accurately will improve Outcome: Progressing   Problem: Nutrition: Goal: Risk of aspiration will decrease Outcome: Progressing Goal: Dietary intake will improve Outcome: Progressing   Problem: Education: Goal: Knowledge of General Education information will improve Description: Including pain rating scale, medication(s)/side effects and non-pharmacologic comfort measures Outcome: Progressing   Problem: Health Behavior/Discharge Planning: Goal: Ability to manage health-related needs will improve Outcome: Progressing   Problem: Clinical Measurements: Goal: Ability to maintain clinical measurements within normal limits will improve Outcome: Progressing Goal: Will remain free from infection Outcome: Progressing Goal: Diagnostic test results will improve Outcome: Progressing Goal: Respiratory complications will improve Outcome: Progressing Goal: Cardiovascular complication will be avoided Outcome: Progressing   Problem: Activity: Goal: Risk for activity intolerance will decrease Outcome: Progressing   Problem: Nutrition: Goal: Adequate nutrition will be maintained Outcome: Progressing   Problem: Coping: Goal: Level of anxiety will decrease Outcome: Progressing   Problem: Elimination: Goal: Will not experience complications related to bowel motility Outcome: Progressing Goal: Will not experience complications related to urinary retention Outcome: Progressing   Problem: Pain Managment: Goal: General experience of comfort will improve Outcome: Progressing   Problem: Safety: Goal: Ability to remain free from injury will improve Outcome:  Progressing   Problem: Skin Integrity: Goal: Risk for impaired skin integrity will decrease Outcome: Progressing   Problem: Education: Goal: Knowledge of disease or condition will improve Outcome: Progressing Goal: Knowledge of secondary prevention will improve (MUST DOCUMENT ALL) Outcome: Progressing Goal: Knowledge of patient specific risk factors will improve Steve Richards N/A or DELETE if not current risk factor) Outcome: Progressing   Problem: Ischemic Stroke/TIA Tissue Perfusion: Goal: Complications of ischemic stroke/TIA will be minimized Outcome: Progressing   Problem: Coping: Goal: Will verbalize positive feelings about self Outcome:  Progressing Goal: Will identify appropriate support needs Outcome: Progressing   Problem: Health Behavior/Discharge Planning: Goal: Ability to manage health-related needs will improve Outcome: Progressing Goal: Goals will be collaboratively established with patient/family Outcome: Progressing   Problem: Self-Care: Goal: Ability to participate in self-care as condition permits will improve Outcome: Progressing Goal: Verbalization of feelings and concerns over difficulty with self-care will improve Outcome: Progressing Goal: Ability to communicate needs accurately will improve Outcome: Progressing   Problem: Nutrition: Goal: Risk of aspiration will decrease Outcome: Progressing Goal: Dietary intake will improve Outcome: Progressing

## 2022-02-17 NOTE — Progress Notes (Signed)
PROGRESS NOTE                                                                                                                                                                                                             Patient Demographics:    Steve Richards, is a 56 y.o. male, DOB - December 05, 1965, POE:[id number]  Outpatient Primary MD for the patient is Girtha Rm, NP-C    LOS - 8  Admit date - 02/08/2022    Chief Complaint  Patient presents with   Stroke Symptoms       Brief Narrative (HPI from H&P)      56 y.o. male with PMH significant for hepatic cirrhosis, morbid obesity, aortic valve replacement with bioprosthetic valve, who presents with word finding difficulty, tremor and vision abnormalities.  He has been having some weakness in his left hand for the past few months had some outpatient neurological workup initially few months ago but could not remain compliant with it.  For the last few weeks he has been having some tremor-like activity in his right hand, he has developed right eye visual problem for which she saw an ophthalmologist and was noted to find right-sided hemianopsia, he also has noticed some problems finding words.   Presented to the ER where MRI was concerning for subtle cortical ribboning suspicious for autoimmune, paraneoplastic etiology vs could be CJD or infectious.  He was seen by neurologist and admitted to the hospital.    Subjective:    Patient appears to be with worsening deficits, vision has significantly worsened, as well as coordination, he is now totally dependent.   Assessment  & Plan :   L. hand weakness presumably from ? pinched nerve in summer 2023 who presents with expressive aphasia, ataxia and tremor with right worse than left and R hemianopsia that has progressively worsened since 01/18/22.  -  Note symptoms ongoing since summer 2023, patient not compliant with outpatient  neurological workup, EEG stable -  CT head and MRI noted findings suspicious for  autoimmune, paraneoplastic etiology vs could be CJD or infectious.   -Input greatly appreciated, meningitis/encephalitis panel is negative, RT quick is pending/autoimmune panel . -Repeat MRI 12/14 showing improving SDH, but concerning for worsening cortical ribboning and left parietal lobe, and there is spreading to the right parietal lobe . 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  Which carries a very poor prognosis.RT-quic is pending  HX of bioprosthetic AVR.   - Monitor with supportive care.  Stable prosthesis in aortic valve physiology on last echo few months ago.  Chronic diastolic CHF EF of 28% on echo done few months ago.   -Compensated.   Obesity.   - BMI of 35.  Follow-up with PCP.  History of cirrhosis of liver.  Question NASH.   - Stable INR and LFTs, outpatient GI workup.    Polycythemia.   - Likely due to OSA, monitor.  Small stable subarachnoid hemorrhage noted on MRI.   - Symptom-free.  Monitor.  Holding aspirin or anticoagulation for now.  Hypothyroidism.   - Home dose Synthroid increased, repeat TSH and free T4 in 3 to 4 weeks.  DM type II.  SSI.  Lab Results  Component Value Date   HGBA1C 6.2 (H) 02/10/2022   CBG (last 3)  Recent Labs    02/16/22 2057 02/17/22 0805 02/17/22 1109  GLUCAP 153* 151* 215*        Condition - Fair  Family Communication  :  None at bedside, discussed with sister  by phone, and his friend Sharee Pimple  Code Status :  DNR as D/W   Consults  :  Neuro  PUD Prophylaxis :    Procedures  :     LP 02/09/22  MRI - 1. Small 14 mm focus suspicious for subarachnoid hemorrhage at the right vertex adjacent to the falx. This was subtle on the earlier CT, not apparent on MRI last month. Etiology and significance is unclear, has there been recent fall? No other acute intracranial hemorrhage is identified, but there is a small number of nonspecific chronic microhemorrhages in both hemispheres. 2. Otherwise a largely unremarkable MRI appearance of the brain, HOWEVER, suspicion of subtle abnormal cortical diffusion widely scattered in the left hemisphere. Although with no cortical edema or abnormal signal on the remaining sequences. No enhancement. Given the clinical suspicion Creutzfeldt-Jakob Disease cannot be excluded. Differential diagnosis would include autoimmune or paraneoplastic encephalitis       Disposition Plan  :    Status is: Inpatient  DVT Prophylaxis  :  Place and maintain sequential compression device Start: 02/09/22 1757   Lab Results  Component Value Date   PLT 130 (L) 02/15/2022    Diet :  Diet Order             Diet regular Room service appropriate? Yes with Assist; Fluid consistency: Thin  Diet effective now                    Inpatient Medications  Scheduled Meds:  docusate sodium  200 mg Oral BID   insulin aspart  0-15 Units Subcutaneous TID WC   insulin aspart  0-5 Units Subcutaneous QHS   levothyroxine  100 mcg Oral Daily   multivitamin with minerals  1 tablet Oral Daily   pantoprazole  40 mg Oral Daily   polyethylene glycol  17 g Oral BID   Continuous Infusions:  Immune Globulin 10% Stopped (02/17/22 0000)  PRN Meds:.acetaminophen **OR** acetaminophen (TYLENOL) oral liquid 160 mg/5 mL **OR** acetaminophen, senna-docusate  Antibiotics  :    Anti-infectives (From admission, onward)    None         Objective:   Vitals:   02/17/22 0400 02/17/22 0600 02/17/22  0800 02/17/22 1105  BP:  (!) 150/100 (!) 158/97 (!) 164/100  Pulse:  79 79 85  Resp: 18 20 17 19   Temp:   97.7 F (36.5 C) 97.9 F (36.6 C)  TempSrc:   Oral Oral  SpO2:  93% 96% 91%  Weight:      Height:        Wt Readings from Last 3 Encounters:  02/09/22 120 kg  02/08/22 118.4 kg  01/31/22 118.4 kg     Intake/Output Summary (Last 24 hours) at 02/17/2022 1212 Last data filed at 02/17/2022 0600 Gross per 24 hour  Intake 911.7 ml  Output 900 ml  Net 11.7 ml       Physical Exam   Alert, with significant aphasia, significant generalized weakness, ataxia, dyskinesia, frequently poor vision.   Symmetrical Chest wall movement, Good air movement bilaterally, CTAB RRR,No Gallops,Rubs or new Murmurs, No Parasternal Heave +ve B.Sounds, Abd Soft, No tenderness, No rebound - guarding or rigidity. No Cyanosis, Clubbing or edema, No new Rash or bruise        Data Review:    Recent Labs  Lab 02/11/22 0210 02/12/22 0310 02/13/22 0321 02/14/22 0516 02/15/22 0421  WBC 6.2 13.4* 10.9* 8.2 8.3  HGB 17.0 17.2* 16.3 16.6 16.9  HCT 50.0 50.8 49.3 51.0 50.2  PLT 176 195 149* 158 130*  MCV 88.0 89.0 89.8 89.3 87.9  MCH 29.9 30.1 29.7 29.1 29.6  MCHC 34.0 33.9 33.1 32.5 33.7  RDW 12.9 13.2 13.2 13.3 13.2  LYMPHSABS 0.4* 0.5* 0.5* 0.3* 0.3*  MONOABS 0.0* 0.2 0.1 0.1 0.2  EOSABS 0.0 0.0 0.0 0.0 0.0  BASOSABS 0.0 0.0 0.0 0.0 0.0    Recent Labs  Lab 02/11/22 0210 02/12/22 0310 02/13/22 0321 02/14/22 0516 02/15/22 0956  NA 136 140 139 137 137  K 4.3 4.1 4.8 3.8 4.2  CL 104 105 103 102 98  CO2 22 24 25 25 26   GLUCOSE 231* 165* 178* 177* 198*  BUN 10 12 15 14 14   CREATININE 0.91 0.98 0.90 0.91 1.00  AST 40 36 35 32 42*  ALT 32 32 35 37 42  ALKPHOS 51 49 47 47 49  BILITOT 0.9 0.7 0.7 0.9 0.7  ALBUMIN 3.9 3.9 3.7 3.8 3.9  CRP 0.5 0.5 <0.5 <0.5  --   BNP 52.9 34.2 433.0* 187.6*  --   MG 2.0 2.1 2.4 2.2  --   CALCIUM 9.3 9.2 8.7* 8.5* 9.1    ----------------------------------------------------------------------------------------------------------------- No results for input(s): "CHOL", "HDL", "LDLCALC", "TRIG", "CHOLHDL", "LDLDIRECT" in the last 72 hours.   Lab Results  Component Value Date   HGBA1C 6.2 (H) 02/10/2022    No results for input(s): "TSH", "T4TOTAL", "T3FREE", "THYROIDAB" in the last 72 hours.  Invalid input(s): "FREET3"     Radiology Reports MR BRAIN W WO CONTRAST  Result Date: 02/15/2022 CLINICAL DATA:  Neuro deficit, neurodegenerative disorder suspected. 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 CONTRAST:  60m GADAVIST GADOBUTROL 1 MMOL/ML IV SOLN COMPARISON:  MRI head 02/09/2022, MRI 01/30/2022 FINDINGS: Brain: Again noted is subtle diffusion  abnormality mostly in the left parietal lobe with cortical hyperintensity on DWI which shows restricted diffusion on ADC map. This may have progressed from the prior studies. This is asymmetric. There may be some early similar findings in the right parietal lobe. Enhancement in this area is normal. No underlying mass or infarct. Normal diffusion-weighted imaging in the brainstem and basal ganglia. Improvement in subarachnoid hemorrhage over the right frontal convexity, presumably posttraumatic. Scattered areas of chronic microhemorrhage in both cerebral hemispheres stable. This is mild. No fluid collection. Ventricle size normal. No midline shift. Vascular: Normal arterial flow voids Skull and upper cervical spine: No focal skeletal lesion. Sinuses/Orbits: Mild mucosal edema paranasal sinuses. Negative orbit Other: None IMPRESSION: 1. Subtle diffusion abnormality mostly in the left parietal lobe. This may have progressed from the prior studies. There may be some early similar findings in  the right parietal lobe. No underlying mass or infarct. Differential diagnosis includes DJD but nonspecific. Correlate with lumbar puncture. 2. Improvement in subarachnoid hemorrhage over the right frontal convexity. 3. Scattered areas of chronic microhemorrhage in both cerebral hemispheres stable from the prior study. This may be due to chronic hypertension. Electronically Signed   By: Franchot Gallo M.D.   On: 02/15/2022 15:23   CT ANGIO HEAD W OR WO CONTRAST  Result Date: 02/14/2022 CLINICAL DATA:  Subarachnoid hemorrhage EXAM: CT ANGIOGRAPHY HEAD TECHNIQUE: Multidetector CT imaging of the head was performed using the standard protocol during bolus administration of intravenous contrast. Multiplanar CT image reconstructions and MIPs were obtained to evaluate the vascular anatomy. RADIATION DOSE REDUCTION: This exam was performed according to the departmental dose-optimization program which includes automated exposure control, adjustment of the mA and/or kV according to patient size and/or use of iterative reconstruction technique. CONTRAST:  59m OMNIPAQUE IOHEXOL 350 MG/ML SOLN COMPARISON:  None Available. FINDINGS: CT HEAD Brain: There is no mass, hemorrhage or extra-axial collection. The size and configuration of the ventricles and extra-axial CSF spaces are normal. The brain parenchyma is normal, without acute or chronic infarction. Previously seen superior right parafalcine hyperdensity is no longer visible. Vascular: No abnormal hyperdensity of the major intracranial arteries or dural venous sinuses. No intracranial atherosclerosis. Skull: The visualized skull base, calvarium and extracranial soft tissues are normal. Sinuses/Orbits: No fluid levels or advanced mucosal thickening of the visualized paranasal sinuses. No mastoid or middle ear effusion. The orbits are normal. CTA HEAD POSTERIOR CIRCULATION: --Vertebral arteries: Normal --Inferior cerebellar arteries: Normal. --Basilar artery: Normal.  --Superior cerebellar arteries: Normal. --Posterior cerebral arteries: Normal. ANTERIOR CIRCULATION: --Intracranial internal carotid arteries: Normal. --Anterior cerebral arteries (ACA): Normal. --Middle cerebral arteries (MCA): Normal. ANATOMIC VARIANTS: None Review of the MIP images confirms the above findings. IMPRESSION: Normal CTA of the head.  No acute subarachnoid hemorrhage. Electronically Signed   By: KUlyses JarredM.D.   On: 02/14/2022 21:27      Signature  -   DEmeline GinsElgergawy M.D on 02/17/2022 at 12:12 PM   -  To page go to [email or web address]

## 2022-02-18 ENCOUNTER — Inpatient Hospital Stay (HOSPITAL_COMMUNITY): Payer: Commercial Managed Care - HMO

## 2022-02-18 DIAGNOSIS — Z515 Encounter for palliative care: Secondary | ICD-10-CM | POA: Diagnosis not present

## 2022-02-18 DIAGNOSIS — Z7189 Other specified counseling: Secondary | ICD-10-CM | POA: Diagnosis not present

## 2022-02-18 DIAGNOSIS — R270 Ataxia, unspecified: Secondary | ICD-10-CM | POA: Diagnosis not present

## 2022-02-18 DIAGNOSIS — H5347 Heteronymous bilateral field defects: Secondary | ICD-10-CM | POA: Diagnosis not present

## 2022-02-18 LAB — BASIC METABOLIC PANEL
Anion gap: 11 (ref 5–15)
BUN: 14 mg/dL (ref 6–20)
CO2: 22 mmol/L (ref 22–32)
Calcium: 8.3 mg/dL — ABNORMAL LOW (ref 8.9–10.3)
Chloride: 100 mmol/L (ref 98–111)
Creatinine, Ser: 0.85 mg/dL (ref 0.61–1.24)
GFR, Estimated: >60 mL/min (ref >60)
Glucose, Bld: 154 mg/dL — ABNORMAL HIGH (ref 70–99)
Potassium: 4.1 mmol/L (ref 3.5–5.1)
Sodium: 133 mmol/L — ABNORMAL LOW (ref 135–145)

## 2022-02-18 LAB — CBC
HCT: 52.7 % — ABNORMAL HIGH (ref 39.0–52.0)
Hemoglobin: 18.1 g/dL — ABNORMAL HIGH (ref 13.0–17.0)
MCH: 30.1 pg (ref 26.0–34.0)
MCHC: 34.3 g/dL (ref 30.0–36.0)
MCV: 87.5 fL (ref 80.0–100.0)
Platelets: 157 10*3/uL (ref 150–400)
RBC: 6.02 MIL/uL — ABNORMAL HIGH (ref 4.22–5.81)
RDW: 13.5 % (ref 11.5–15.5)
WBC: 10.8 10*3/uL — ABNORMAL HIGH (ref 4.0–10.5)
nRBC: 0 % (ref 0.0–0.2)

## 2022-02-18 LAB — GLUCOSE, CAPILLARY
Glucose-Capillary: 167 mg/dL — ABNORMAL HIGH (ref 70–99)
Glucose-Capillary: 187 mg/dL — ABNORMAL HIGH (ref 70–99)
Glucose-Capillary: 210 mg/dL — ABNORMAL HIGH (ref 70–99)
Glucose-Capillary: 162 mg/dL — ABNORMAL HIGH (ref 70–99)

## 2022-02-18 LAB — IGA: IgA: 222 mg/dL (ref 90–386)

## 2022-02-18 MED ORDER — LACTATED RINGERS IV SOLN: INTRAVENOUS | Status: DC

## 2022-02-18 MED ORDER — IOHEXOL 350 MG/ML SOLN
75.0000 mL | Freq: Once | INTRAVENOUS | Status: AC | PRN
  Administered 2022-02-18: 75 mL via INTRAVENOUS

## 2022-02-18 NOTE — Progress Notes (Signed)
PROGRESS NOTE                                                                                                                                                                                                             Patient Demographics:    Steve Richards, is a 56 y.o. male, DOB - 14-Nov-1965, VHQ:[id number]  Outpatient Primary MD for the patient is Girtha Rm, NP-C    LOS - 9  Admit date - 02/08/2022    Chief Complaint  Patient presents with   Stroke Symptoms       Brief Narrative (HPI from H&P)      56 y.o. male with PMH significant for hepatic cirrhosis, morbid obesity, aortic valve replacement with bioprosthetic valve, who presents with word finding difficulty, tremor and vision abnormalities.  He has been having some weakness in his left hand for the past few months had some outpatient neurological workup initially few months ago but could not remain compliant with it.  For the last few weeks he has been having some tremor-like activity in his right hand, he has developed right eye visual problem for which she saw an ophthalmologist and was noted to find right-sided hemianopsia, he also has noticed some problems finding words.   Presented to the ER where MRI was concerning for subtle cortical ribboning suspicious for autoimmune, paraneoplastic etiology vs could be CJD or infectious.  He was seen by neurologist and admitted to the hospital.    Subjective:    No significant events overnight, patient currently will need assistance with all of his activities.     Assessment  & Plan :   L. hand weakness presumably from ? pinched nerve in summer 2023 who presents with expressive aphasia, ataxia and tremor with right worse than left and R hemianopsia that has progressively worsened since 01/18/22.  -  Note symptoms ongoing since summer 2023, patient not compliant with outpatient neurological workup, EEG stable -  CT head  and MRI noted findings suspicious for  autoimmune, paraneoplastic etiology vs could be CJD or infectious.   -Input greatly appreciated, meningitis/encephalitis panel is negative, RT quick is pending/autoimmune panel . -Repeat MRI 12/14 showing improving SDH, but concerning for worsening cortical ribboning and left parietal lobe, and there is spreading to the right parietal lobe . 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  Which carries a very poor prognosis.RT-quic is pending -Will perform CT chest/abdomen/pelvis with IV contrast to look for any malignancy, or abnormal finding may be causing paraneoplastic syndrome.  HX of bioprosthetic AVR.   - Monitor with supportive care.  Stable prosthesis in aortic valve physiology on last echo few months ago.  Chronic diastolic CHF EF of 67% on echo done few months ago.   -Compensated.   Obesity.   - BMI of 35.  Follow-up with PCP.  History of cirrhosis of liver.  Question NASH.   - Stable INR and LFTs, outpatient GI workup.    Polycythemia.   - Likely due to OSA, monitor.  Small stable subarachnoid hemorrhage noted on MRI.   - Symptom-free.  Monitor.  Holding aspirin or anticoagulation for now.  Hypothyroidism.   - Home dose Synthroid increased, repeat TSH and free T4 in 3 to 4 weeks.  DM type II.  SSI.  Lab Results  Component Value Date   HGBA1C 6.2 (H) 02/10/2022   CBG (last 3)  Recent Labs    02/17/22 1735 02/17/22 2120 02/18/22 0808  GLUCAP 174* 209* 167*        Condition - Fair  Family  Communication  :  None at bedside, discussed with sister  by phone, and his friend Sharee Pimple by phone 12/16  Code Status : DNR   Consults  :  Neuro  PUD Prophylaxis :    Procedures  :     LP 02/09/22  MRI - 1. Small 14 mm focus suspicious for subarachnoid hemorrhage at the right vertex adjacent to the falx. This was subtle on the earlier CT, not apparent on MRI last month. Etiology and significance is unclear, has there been recent fall? No other acute intracranial hemorrhage is identified, but there is a small number of nonspecific chronic microhemorrhages in both hemispheres. 2. Otherwise a largely unremarkable MRI appearance of the brain, HOWEVER, suspicion of subtle abnormal cortical diffusion widely scattered in the left hemisphere. Although with no cortical edema or abnormal signal on the remaining sequences. No enhancement. Given the clinical suspicion Creutzfeldt-Jakob Disease cannot be excluded. Differential diagnosis would include autoimmune or paraneoplastic encephalitis       Disposition Plan  :    Status is: Inpatient  DVT Prophylaxis  :  Place and maintain sequential compression device Start: 02/09/22 1757   Lab Results  Component Value Date   PLT 157 02/18/2022    Diet :  Diet Order             Diet regular Room service appropriate? Yes with Assist; Fluid consistency: Thin  Diet effective now                    Inpatient Medications  Scheduled Meds:  docusate sodium  200 mg Oral BID   insulin aspart  0-15 Units Subcutaneous TID WC   insulin aspart  0-5 Units Subcutaneous QHS   levothyroxine  100 mcg Oral Daily   multivitamin with minerals  1 tablet Oral Daily   pantoprazole  40 mg Oral Daily   polyethylene glycol  17 g Oral BID   Continuous Infusions:  Immune Globulin 10% Stopped (02/17/22 2155)   lactated ringers 75 mL/hr at 02/18/22 1056    PRN Meds:.acetaminophen **OR** acetaminophen (TYLENOL) oral liquid 160 mg/5 mL **OR** acetaminophen,  senna-docusate  Antibiotics  :    Anti-infectives (From admission, onward)    None         Objective:   Vitals:   02/18/22 0400 02/18/22 0409 02/18/22 0600 02/18/22 0809  BP:  133/83  (!) 154/130  Pulse: 82 78 85 85  Resp: 14 11 19 13   Temp:  98.1 F (36.7 C)  98.2 F (36.8 C)  TempSrc:  Oral  Oral  SpO2: 92% 93% 95% 94%  Weight:      Height:        Wt Readings from Last 3 Encounters:  02/09/22 120 kg  02/08/22 118.4 kg  01/31/22 118.4 kg     Intake/Output Summary (Last 24 hours) at 02/18/2022 1159 Last data filed at 02/18/2022 0318 Gross per 24 hour  Intake 1406.95 ml  Output 2000 ml  Net -593.05 ml       Physical Exam   Alert, with significant aphasia, significant generalized weakness, ataxia, dyskinesia, very poor vision.   Symmetrical Chest wall movement, Good air movement bilaterally, CTAB RRR,No Gallops,Rubs or new Murmurs, No Parasternal Heave +ve B.Sounds, Abd Soft, No tenderness, No rebound - guarding or rigidity. No Cyanosis, Clubbing or edema, No new Rash or bruise         Data Review:    Recent Labs  Lab 02/12/22 0310 02/13/22 0321 02/14/22 0516 02/15/22 0421 02/18/22 0211  WBC 13.4* 10.9* 8.2 8.3 10.8*  HGB 17.2* 16.3 16.6 16.9 18.1*  HCT 50.8 49.3 51.0 50.2 52.7*  PLT 195 149* 158 130* 157  MCV 89.0 89.8 89.3 87.9 87.5  MCH 30.1 29.7 29.1 29.6 30.1  MCHC 33.9 33.1 32.5 33.7 34.3  RDW 13.2 13.2 13.3 13.2 13.5  LYMPHSABS 0.5* 0.5* 0.3* 0.3*  --   MONOABS 0.2 0.1 0.1 0.2  --   EOSABS 0.0 0.0 0.0 0.0  --   BASOSABS 0.0 0.0 0.0 0.0  --     Recent Labs  Lab 02/12/22 0310 02/13/22 0321 02/14/22 0516 02/15/22 0956 02/18/22 0211  NA 140 139 137 137 133*  K 4.1 4.8 3.8 4.2 4.1  CL 105 103 102 98 100  CO2 24 25 25 26 22   GLUCOSE 165* 178* 177* 198* 154*  BUN 12 15 14 14 14   CREATININE 0.98 0.90 0.91 1.00 0.85  AST 36 35 32 42*  --   ALT 32 35 37 42  --   ALKPHOS 49 47 47 49  --   BILITOT 0.7 0.7 0.9 0.7  --    ALBUMIN 3.9 3.7 3.8 3.9  --   CRP 0.5 <0.5 <0.5  --   --   BNP 34.2 433.0* 187.6*  --   --   MG 2.1 2.4 2.2  --   --   CALCIUM 9.2 8.7* 8.5* 9.1 8.3*   ----------------------------------------------------------------------------------------------------------------- No results for input(s): "CHOL", "HDL", "LDLCALC", "TRIG", "CHOLHDL", "LDLDIRECT" in the last 72 hours.   Lab Results  Component Value Date   HGBA1C 6.2 (H) 02/10/2022    No results for input(s): "TSH", "T4TOTAL", "T3FREE", "THYROIDAB" in the last 72 hours.  Invalid input(s): "FREET3"     Radiology Reports MR BRAIN W WO CONTRAST  Result Date: 02/15/2022 CLINICAL DATA:  Neuro deficit, neurodegenerative disorder suspected. Possible CJD versus  autoimmune encephalopathy EXAM: MRI HEAD WITHOUT AND WITH CONTRAST TECHNIQUE: Multiplanar, multiecho pulse sequences of the brain and surrounding structures were obtained without and with intravenous contrast. CONTRAST:  26m GADAVIST GADOBUTROL 1 MMOL/ML IV SOLN COMPARISON:  MRI head 02/09/2022, MRI 01/30/2022 FINDINGS: Brain: Again noted is subtle diffusion abnormality mostly in the left parietal lobe with cortical hyperintensity on DWI which shows restricted diffusion on ADC map. This may have progressed from the prior studies. This is asymmetric. There may be some early similar findings in the right parietal lobe. Enhancement in this area is normal. No underlying mass or infarct. Normal diffusion-weighted imaging in the brainstem and basal ganglia. Improvement in subarachnoid hemorrhage over the right frontal convexity, presumably posttraumatic. Scattered areas of chronic microhemorrhage in both cerebral hemispheres stable. This is mild. No fluid collection. Ventricle size normal. No midline shift. Vascular: Normal arterial flow voids Skull and upper cervical spine: No focal skeletal lesion. Sinuses/Orbits: Mild mucosal edema paranasal sinuses. Negative orbit Other: None IMPRESSION: 1.  Subtle diffusion abnormality mostly in the left parietal lobe. This may have progressed from the prior studies. There may be some early similar findings in the right parietal lobe. No underlying mass or infarct. Differential diagnosis includes DJD but nonspecific. Correlate with lumbar puncture. 2. Improvement in subarachnoid hemorrhage over the right frontal convexity. 3. Scattered areas of chronic microhemorrhage in both cerebral hemispheres stable from the prior study. This may be due to chronic hypertension. Electronically Signed   By: CFranchot GalloM.D.   On: 02/15/2022 15:23   CT ANGIO HEAD W OR WO CONTRAST  Result Date: 02/14/2022 CLINICAL DATA:  Subarachnoid hemorrhage EXAM: CT ANGIOGRAPHY HEAD TECHNIQUE: Multidetector CT imaging of the head was performed using the standard protocol during bolus administration of intravenous contrast. Multiplanar CT image reconstructions and MIPs were obtained to evaluate the vascular anatomy. RADIATION DOSE REDUCTION: This exam was performed according to the departmental dose-optimization program which includes automated exposure control, adjustment of the mA and/or kV according to patient size and/or use of iterative reconstruction technique. CONTRAST:  713mOMNIPAQUE IOHEXOL 350 MG/ML SOLN COMPARISON:  None Available. FINDINGS: CT HEAD Brain: There is no mass, hemorrhage or extra-axial collection. The size and configuration of the ventricles and extra-axial CSF spaces are normal. The brain parenchyma is normal, without acute or chronic infarction. Previously seen superior right parafalcine hyperdensity is no longer visible. Vascular: No abnormal hyperdensity of the major intracranial arteries or dural venous sinuses. No intracranial atherosclerosis. Skull: The visualized skull base, calvarium and extracranial soft tissues are normal. Sinuses/Orbits: No fluid levels or advanced mucosal thickening of the visualized paranasal sinuses. No mastoid or middle ear effusion.  The orbits are normal. CTA HEAD POSTERIOR CIRCULATION: --Vertebral arteries: Normal --Inferior cerebellar arteries: Normal. --Basilar artery: Normal. --Superior cerebellar arteries: Normal. --Posterior cerebral arteries: Normal. ANTERIOR CIRCULATION: --Intracranial internal carotid arteries: Normal. --Anterior cerebral arteries (ACA): Normal. --Middle cerebral arteries (MCA): Normal. ANATOMIC VARIANTS: None Review of the MIP images confirms the above findings. IMPRESSION: Normal CTA of the head.  No acute subarachnoid hemorrhage. Electronically Signed   By: KeUlyses Jarred.D.   On: 02/14/2022 21:27      Signature  -   DaEmeline Ginslgergawy M.D on 02/18/2022 at 11:59 AM   -  To page go to [email or web address]

## 2022-02-18 NOTE — Consult Note (Signed)
Palliative Medicine Inpatient Consult Note  Consulting Provider: Albertine Patricia, MD   Reason for consult:   [place] Palliative Medicine Consult  Reason for Consult? goals of care   02/18/2022  HPI:  Per intake H&P --> 56 y.o. male with PMH significant for hepatic cirrhosis, morbid obesity, aortic valve replacement with bioprosthetic valve, who presents with word finding difficulty, tremor and vision abnormalities. Has experienced weeks of weakness and tremors in right hand. Neurology is involved and work up is being processed though considerations include a possible autoimmune process versus CJD.  Palliative care has been asked to get involved to further address goals f care, if CJD the long term prognosis is quite poor.   Clinical Assessment/Goals of Care:  *Please note that this is a verbal dictation therefore any spelling or grammatical errors are due to the "[place] [place]" system interpretation.  I have reviewed medical records including EPIC notes, labs and imaging, received report from bedside RN, assessed the patient who is lying in bed in NAD, he is able to speak though gets frustrated as he is unable to express his thoughts cohesively.   Had a long discussion in the presence of his friend [place] regarding his present clinical state. He is very aware of his short prognosis and share the doctor has given him only six weeks at the most to live. ________________________________________   I called patients sister, Beacher May" to further discuss diagnosis prognosis, [place], EOL wishes, disposition and options.   I introduced Palliative Medicine as specialized medical care for people living with serious illness. It focuses on providing relief from the symptoms and stress of a serious illness. The goal is to improve quality of life for both the patient and the family.  Medical History Review and Understanding:  Reviewed patient's past medical  history of cirrhosis, congestive heart failure, and open heart surgery 2 years ago requiring aortic valve replacement.   Social History:  Jushua is originally from [place] [facility].  He moved to [place] as he had been a Chartered certified accountant for [employer] and one of his colleagues moved therefore he decided he follow him and has lived here ever since.  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  He has never been married nor does he have any children.  He has a sister who lives in [place] and a brother who lives in [place].  Functional and Nutritional State:  Prior to mid-October Karriem was functioning well.  His sister shares that he had been training for a marathon and was very active and involved with multiple friends.  Unfortunately when he decided to come to the hospital his level of functionality had decreased to where he is now whereby he is dependent for all B ADLs.  He did have a good appetite preceding hospitalization.  Advance Directives:  A detailed discussion was had today regarding advanced directives.  Patient's Sister Pamala Hurry is his Ambulance person.  Code Status:  Concepts specific to code status, artifical feeding and hydration, continued IV antibiotics and rehospitalization was had.  The difference between a aggressive medical intervention path  and a palliative comfort care path for this patient at this time was had.   Jevaughn is an established DO NOT RESUSCITATE DO NOT INTUBATE CODE STATUS.  Discussion:  I asked Pamala Hurry which she understood about Molly's present health state.  She shares that Dr. Waldron Labs has updated her regularly and as  she is shared with her that he has some type of neurodegenerative process which is occurring and is extremely rapid and its progression.  Per what Pamala Hurry understands if Daylin truly does have Creutzfeldt-Jakob disease then his prognosis will be limited to weeks.  She shares that even  if this is not what he has the present treatments steroids and IVIG do not seem to be slowing the rapidity of the process.  Pamala Hurry expresses that she does plan to come to [place] this week though she lives in a remote area of [place] therefore it will take her at least 2 days to get here.  Once she speaks to the neurology team and primary medical service she plans on calling her brother who is in [place] to come out as well.  We discussed that unfortunately if this is what is suspected to be there are no cures and we would truly be discussing end-of-life/hospice care.I described hospice as a service for patients who have a life expectancy of 6 months or less.The goal of hospice is the preservation of dignity and quality at the end phases of life. Under hospice care, the focus changes from curative to symptom relief.   I was able to describe the differences between home hospice and inpatient hospice.  Discussed the importance of continued conversation with family and their  medical providers regarding overall plan of care and treatment options, ensuring decisions are within the context of the patients values and GOCs.  Decision Maker: Sadlon,Barbara (Sister): [phone] (Mobile)   SUMMARY OF RECOMMENDATIONS   DNAR/DNI  Patient's sister has requested an update from the neurology team to get a better idea of when she should fly to town  Open and honest conversations were held with patient's sister regarding patient's likely poor prognosis  Discussed the idea of hospice should patient's clinical condition continue to worsen  Ongoing palliative support  Code Status/Advance Care Planning: DNAR/DNI  Palliative Prophylaxis:  Aspiration, Bowel Regimen, Delirium Protocol, Frequent Pain Assessment, Oral Care, Palliative Wound Care, and Turn Reposition  Additional Recommendations (Limitations, Scope, Preferences): Continue current care  Psycho-social/Spiritual:  Desire for further  Chaplaincy support: Not presently Additional Recommendations: Yes-review of neurodegenerative disease processes   Prognosis: Suspected to be very poor though additional labs are pending - 2-6 weeks  Discharge Planning: Discharge plan is uncertain  Vitals:   49/67/59 0409 02/18/22 0600  BP: 133/83   Pulse: 78 85  Resp: 11 19  Temp: 98.1 F (36.7 C)   SpO2: 93% 95%    Intake/Output Summary (Last 24 hours) at 02/18/2022 0649 Last data filed at 02/18/2022 0318 Gross per 24 hour  Intake 1406.95 ml  Output 2000 ml  Net -593.05 ml   Last Weight  Most recent update: 02/09/2022  9:27 PM    Weight  120 kg (264 lb 8.8 oz)            Gen: 28 Caucasian male in no acute distress HEENT: moist mucous membranes CV: Regular rate and rhythm  PULM: On room air breathing is even and nonlabored ABD: soft/nontender  EXT: No edema Neuro: Alert and oriented x3   PPS: 20%   This conversation/these recommendations were discussed with patient primary care team, Dr. Waldron Labs  Total Time: 120  Billing based on MDM: High ______________________________________________________ [place] Team Team Cell Phone: [phone] Please utilize secure chat with additional questions, if there is no response within 30 minutes please call the above phone number  Palliative Medicine Team  providers are available by phone from 7am to 7pm daily and can be reached through the team cell phone.  Should this patient require assistance outside of these hours, please call the patient's attending physician.

## 2022-02-19 ENCOUNTER — Telehealth: Payer: Self-pay | Admitting: Family Medicine

## 2022-02-19 DIAGNOSIS — R270 Ataxia, unspecified: Secondary | ICD-10-CM | POA: Diagnosis not present

## 2022-02-19 DIAGNOSIS — H5347 Heteronymous bilateral field defects: Secondary | ICD-10-CM | POA: Diagnosis not present

## 2022-02-19 DIAGNOSIS — K7460 Unspecified cirrhosis of liver: Secondary | ICD-10-CM

## 2022-02-19 DIAGNOSIS — A8100 Creutzfeldt-Jakob disease, unspecified: Secondary | ICD-10-CM | POA: Diagnosis not present

## 2022-02-19 DIAGNOSIS — Z66 Do not resuscitate: Secondary | ICD-10-CM

## 2022-02-19 LAB — CBC
HCT: 54.2 % — ABNORMAL HIGH (ref 39.0–52.0)
Hemoglobin: 18.2 g/dL — ABNORMAL HIGH (ref 13.0–17.0)
MCH: 29.7 pg (ref 26.0–34.0)
MCHC: 33.6 g/dL (ref 30.0–36.0)
MCV: 88.6 fL (ref 80.0–100.0)
Platelets: 127 10*3/uL — ABNORMAL LOW (ref 150–400)
RBC: 6.12 MIL/uL — ABNORMAL HIGH (ref 4.22–5.81)
RDW: 13.6 % (ref 11.5–15.5)
WBC: 7.7 10*3/uL (ref 4.0–10.5)
nRBC: 0 % (ref 0.0–0.2)

## 2022-02-19 LAB — BASIC METABOLIC PANEL
Anion gap: 10 (ref 5–15)
BUN: 10 mg/dL (ref 6–20)
CO2: 25 mmol/L (ref 22–32)
Calcium: 8.4 mg/dL — ABNORMAL LOW (ref 8.9–10.3)
Chloride: 98 mmol/L (ref 98–111)
Creatinine, Ser: 0.91 mg/dL (ref 0.61–1.24)
GFR, Estimated: >60 mL/min (ref >60)
Glucose, Bld: 136 mg/dL — ABNORMAL HIGH (ref 70–99)
Potassium: 4.2 mmol/L (ref 3.5–5.1)
Sodium: 133 mmol/L — ABNORMAL LOW (ref 135–145)

## 2022-02-19 LAB — GLUCOSE, CAPILLARY
Glucose-Capillary: 141 mg/dL — ABNORMAL HIGH (ref 70–99)
Glucose-Capillary: 156 mg/dL — ABNORMAL HIGH (ref 70–99)
Glucose-Capillary: 147 mg/dL — ABNORMAL HIGH (ref 70–99)
Glucose-Capillary: 147 mg/dL — ABNORMAL HIGH (ref 70–99)

## 2022-02-19 LAB — MISC LABCORP TEST (SEND OUT)

## 2022-02-19 MED ORDER — MELATONIN 5 MG PO TABS
5.0000 mg | ORAL_TABLET | Freq: Every evening | ORAL | Status: DC | PRN
  Administered 2022-02-19 (×2): 5 mg via ORAL
  Filled 2022-02-19 (×2): qty 1

## 2022-02-19 NOTE — Progress Notes (Signed)
Palliative Care Progress Note, Assessment & Plan   Patient Name: Steve Richards       Date: 02/19/2022 DOB: 10-16-1965  Age: 56 y.o. MRN#: [id number] Attending Physician: Albertine Patricia, MD Primary Care Physician: Girtha Rm, NP-C Admit Date: 02/08/2022  Subjective: Patient is lying in bed in no apparent distress.  Respirations are even and unlabored.  He denies pain or discomfort at this time.  Friend is visiting at bedside.  HPI: 56 y.o. male with PMH significant for hepatic cirrhosis, morbid obesity, aortic valve replacement with bioprosthetic valve, who presents with word finding difficulty, tremor and vision abnormalities. PTA he experienced weeks of weakness and tremors in right hand. Neurology was consulted and subsequent bloodwork revealed elevated protein 14-4-4 and elevated T tau with positive RT-Quic, confirming suspicion of CJD.   Summary of counseling/coordination of care: After reviewing the patient's chart, I counseled with RN Brittney. She took care of the patient last Saturday (2 days ago) and notes a significant decline in his communication and mobility over the last 2 days.  After speaking with the nurse, I assessed the patient at bedside, I spoke with patient about prognosis and plan of care. He is unable to say many words but was able to confirm that he is not in pain or discomfort. His friend at bedside shared that she and patient have birthdays one day apart. Pt then started to repeat the word June and smile. He is unable to make compete sentences but able to say a few words to confirm understanding of our discussion.   We discussed spending time with family and friends. I shared that his sister plans to visit this week, arriving late Wednesday and likely coming to the hospital  either late Wednesday or Thursday. He started to tear up and after many attempts to say it, he said, "if she makes it she makes it".   Therapeutic silence and active listening provided for patient to share his thoughts and emotions regarding current medical situation.  Emotional support provided.  After my visit, I attempted to speak with patient's sister Raford Pitcher. No answer (she works 3pm-11pm EST though she lives in [place]). HIPAA appropriate VM left.   PMT will continue to follow and provide support to patient and family throughout his hospitalization.   Physical Exam Vitals reviewed.  Constitutional:      General: He is not in acute distress.    Appearance: Normal appearance. He is not ill-appearing.  HENT:     Head: Normocephalic.     Mouth/Throat:     Mouth: Mucous membranes are moist.  Cardiovascular:     Rate and Rhythm: Normal rate.     Pulses: Normal pulses.  Pulmonary:     Effort: Pulmonary effort is normal.  Abdominal:     Palpations: Abdomen is soft.  Musculoskeletal:     Comments: Generalized weakness  Skin:    General: Skin is warm and dry.  Neurological:     Mental Status: He is alert.     Comments: Expressive aphasia, able to say a few words and nod head to yes/no answers  Psychiatric:        Behavior: Behavior normal.  Palliative Assessment/Data: 30%    Total Time 35 minutes   Thank you for allowing the Palliative Medicine Team to assist in the care of this patient.  [place] Ilsa Iha, FNP-BC Palliative Medicine Team Team Phone # [phone]

## 2022-02-19 NOTE — Telephone Encounter (Signed)
LM for sister to return my call

## 2022-02-19 NOTE — Progress Notes (Signed)
CM received information that patients sister would like to speak to CM. CM called and she has questions for the MD due to patients new diagnosis. CM has asked Dr Waldron Labs to reach out to the sister. CM has updated her that [facility] will talk with her in the am after she has discussed her questions with the MD.

## 2022-02-19 NOTE — Telephone Encounter (Signed)
Steve Richards called back and she is very concerned about what is going on with her brother. She isn't getting much information from the hospital on what is going on and the results of his tests as [place]. She reports they told her they were looking into CJD but at this time she does not have a result to see if this is exactly what it is and is wondering if it is CJD, how did this happen? She reports he had a heart transplant 2 years ago but didn't know if that would have had any effect. She says they were supposed to do another CT last week and when she asked them when she will hear the results they told her they have no idea when the results will be in. At this time she is really just confused and concerned as the hospital has not been keeping her in the loop and really just wants answers and to try and understand what is going on with her brother. She is thinking if this diagnosis is terminal, she would like to take him to [facility] to get a second opinion but wants to speak with someone about his condition first. She is flying out Wednesday and should get here in [place] late that night and plans to stay at least until Friday. She is wanting to stay in touch with PCP and is requesting a call for further information. I informed her that PCP is out of the office today but is expected to return tomorrow, she states this is fine but just wanted [place] to know she is off today in case we need to call back, but tomorrow she will be working from 3pm-11pm our time, so if we call during then to just leave her a message.

## 2022-02-19 NOTE — Progress Notes (Signed)
Physical Therapy Treatment Patient Details Name: Steve Richards MRN: [id number] DOB: [birth year] Today's Date: 02/19/2022   History of Present Illness 56 y.o. male adm 12/7 with PMH significant for hepatic cirrhosis, morbid obesity, aortic valve replacement with bioprosthetic valve, who presentd with word finding difficulty, tremor and vision abnormalities.  MRI: Small 14 mm focus suspicious for subarachnoid hemorrhage at the right vertex adjacent to the falx, "largely unremarkable MRI" Per neuro consult, "Differential includes autoimmune, paraneoplastic etiology vs could be CJD or infectious."    PT Comments    Continuing to follow; Pt was dealing with a lot today, and declined getting OOB; Agreed to reposition more upright in the bed while his friends were visiting, and this PT assisted with Rehab tech help as well; Will follow as long as PT is congruent with pt's goals of care; anticipate the need for goals to be downgraded, and/or PT will sign off if there are no PT needs   Recommendations for follow up therapy are one component of a multi-disciplinary discharge planning process, led by the attending physician.  Recommendations may be updated based on patient status, additional functional criteria and insurance authorization.  Follow Up Recommendations  Skilled nursing-short term rehab (<3 hours/day)     Assistance Recommended at Discharge Frequent or constant Supervision/Assistance  Patient can return home with the following Assistance with cooking/housework;Assist for transportation;Help with stairs or ramp for entrance;A lot of help with bathing/dressing/bathroom;Two people to help with walking and/or transfers   Equipment Recommendations  Rolling walker (2 wheels);Wheelchair (measurements PT);Wheelchair cushion (measurements PT);Hospital bed    Recommendations for Other Services       Precautions / Restrictions Precautions Precautions: Fall;Other (comment) Precaution Comments: In  hospital fall 12/13, imparied vision     Mobility  Bed Mobility Overal bed mobility: Needs Assistance             General bed mobility comments: 2 person assist to slide up in the bed, and reposition for comfort; bed placed in chair position; Bil arms propped on pillows; bed alarm on    Transfers                        Ambulation/Gait                   Stairs             Wheelchair Mobility    Modified Rankin (Stroke Patients Only)       Balance                                            Cognition Arousal/Alertness: Awake/alert Behavior During Therapy: Flat affect Overall Cognitive Status: Impaired/Different from baseline                                 General Comments: difficulty with word finding and orientation        Exercises      General Comments General comments (skin integrity, edema, etc.): 2 friends in for a visit      Pertinent Vitals/Pain Pain Assessment Pain Assessment: No/denies pain    Home Living                          Prior Function  PT Goals (current goals can now be found in the care plan section) Acute Rehab PT Goals Patient Stated Goal: Did not specifically state PT Goal Formulation: With patient Time For Goal Achievement: 02/23/2022 Potential to Achieve Goals: Poor Progress towards PT goals: Progressing toward goals    Frequency    Min 3X/week      PT Plan Current plan remains appropriate;Equipment recommendations need to be updated    Co-evaluation              AM-PAC PT "6 Clicks" Mobility   Outcome Measure  Help needed turning from your back to your side while in a flat bed without using bedrails?: A Lot Help needed moving from lying on your back to sitting on the side of a flat bed without using bedrails?: Total Help needed moving to and from a bed to a chair (including a wheelchair)?: Total Help needed standing up from a  chair using your arms (e.g., wheelchair or bedside chair)?: Total Help needed to walk in hospital room?: Total Help needed climbing 3-5 steps with a railing? : Total 6 Click Score: 7    End of Session   Activity Tolerance: Other (comment) (Limited session to repositioning in the bed for comfort and more upright for visit) Patient left: in bed;with call bell/phone within reach;with bed alarm set (bed in semi-chair position) Nurse Communication: Mobility status PT Visit Diagnosis: Unsteadiness on feet (R26.81);Other abnormalities of gait and mobility (R26.89);Ataxic gait (R26.0)     Time: 7618-4859 PT Time Calculation (min) (ACUTE ONLY): 9 min  Charges:  $Therapeutic Activity: 8-22 mins                     Steve Richards, PT  Acute Rehabilitation Services Office [phone]    Steve Richards 02/19/2022, 2:29 PM

## 2022-02-19 NOTE — Telephone Encounter (Signed)
Patient is currently in hospital and it is terminal - His Sister Pamala Hurry (on [place]) would like to talk to you about his condition.  Please call her at [phone]

## 2022-02-19 NOTE — Progress Notes (Addendum)
PROGRESS NOTE                                                                                                                                                                                                             Patient Demographics:    Steve Richards, is a 56 y.o. male, DOB - 02/22/1966, NTZ:[id number]  Outpatient Primary MD for the patient is Girtha Rm, NP-C    LOS - 10  Admit date - 02/08/2022    Chief Complaint  Patient presents with   Stroke Symptoms       Brief Narrative (HPI from H&P)      56 y.o. male with PMH significant for hepatic cirrhosis, morbid obesity, aortic valve replacement with bioprosthetic valve, who presents with word finding difficulty, tremor and vision abnormalities.  He has been having some weakness in his left hand for the past few months had some outpatient neurological workup initially few months ago but could not remain compliant with it.  For the last few weeks he has been having some tremor-like activity in his right hand, he has developed right eye visual problem for which she saw an ophthalmologist and was noted to find right-sided hemianopsia, he also has noticed some problems finding words.   Presented to the ER where MRI was concerning for subtle cortical ribboning suspicious for autoimmune, paraneoplastic etiology vs could be CJD or infectious.  He was seen by neurologist and admitted to the hospital.    Subjective:    No significant events overnight, patient currently will need assistance with all of his activities.     Assessment  & Plan :   L. hand weakness presumably from ? pinched nerve in summer 2023 who presents with expressive aphasia, ataxia and tremor with right worse than left and R hemianopsia that has progressively worsened since 01/18/22.  -  Note symptoms ongoing since summer 2023, patient not compliant with outpatient neurological workup, EEG stable -  CT  head and MRI noted findings suspicious for  autoimmune, paraneoplastic etiology vs could be CJD or infectious.   -Input greatly appreciated, meningitis/encephalitis panel is negative, RT quick is pending/autoimmune panel . -Repeat MRI 12/14 showing improving SDH, but concerning for worsening cortical ribboning and left parietal lobe, and there is spreading to the right parietal lobe . 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  As well patient CGD testing was elevated protein 14-3-3, and elevated dT tau,  with positive RT Quick test, which confirmed the suspicious for CGD. -DC IVIG at this point given confirmed CGD diagnosis especially with autoimmune panel still pending.   HX of bioprosthetic AVR.   - Monitor with supportive care.  Stable prosthesis in aortic valve physiology on last echo few months ago.  Chronic diastolic CHF EF of 73% on echo done few months ago.   -Compensated.   Obesity.   - BMI of 35.  Follow-up with PCP.  History of cirrhosis of liver.  Question NASH.   - Stable INR and LFTs, outpatient GI workup.    Polycythemia.   - Likely due to OSA, monitor.  Small stable subarachnoid hemorrhage noted on MRI.   - Symptom-free.  Monitor.  Holding aspirin or anticoagulation for now.  Hypothyroidism.   - Home dose Synthroid increased, repeat TSH and free T4 in 3 to 4 weeks.  DM type II.  SSI.  Lab Results  Component Value Date   HGBA1C 6.2 (H) 02/10/2022   CBG (last 3)  Recent Labs    02/18/22 2138 02/19/22 0847 02/19/22 1224  GLUCAP  162* 141* 156*        Condition - poor prognosis Family Communication  :  None at bedside, discussed with sister  by phone, 12/17  Code Status : DNR   Consults  :  Neuro  PUD Prophylaxis :    Procedures  :     LP 02/09/22  MRI - 1. Small 14 mm focus suspicious for subarachnoid hemorrhage at the right vertex adjacent to the falx. This was subtle on the earlier CT, not apparent on MRI last month. Etiology and significance is unclear, has there been recent fall? No other acute intracranial hemorrhage is identified, but there is a small number of nonspecific chronic microhemorrhages in both hemispheres. 2. Otherwise a largely unremarkable MRI appearance of the brain, HOWEVER, suspicion of subtle abnormal cortical diffusion widely scattered in the left hemisphere. Although with no cortical edema or abnormal signal on the remaining sequences. No enhancement. Given the clinical suspicion Creutzfeldt-Jakob Disease cannot be excluded. Differential diagnosis would include autoimmune or paraneoplastic encephalitis       Disposition Plan  :    Status is: Inpatient  DVT Prophylaxis  :  Place and maintain sequential compression device Start: 02/09/22 1757   Lab Results  Component Value Date   PLT 127 (L) 02/19/2022    Diet :  Diet Order             Diet regular Room service appropriate? Yes with Assist; Fluid consistency: Thin  Diet effective now                    Inpatient Medications  Scheduled Meds:  docusate sodium  200 mg Oral BID   insulin aspart  0-15 Units Subcutaneous TID WC   insulin aspart  0-5 Units Subcutaneous QHS   levothyroxine  100 mcg Oral Daily   multivitamin with minerals  1 tablet Oral Daily   pantoprazole  40  mg Oral Daily   polyethylene glycol  17 g Oral BID   Continuous Infusions:  Immune Globulin 10% 240 mL/hr at 02/18/22 1747   lactated ringers 75 mL/hr at 02/19/22 0049    PRN Meds:.acetaminophen **OR** acetaminophen (TYLENOL) oral liquid 160  mg/5 mL **OR** acetaminophen, melatonin, senna-docusate  Antibiotics  :    Anti-infectives (From admission, onward)    None         Objective:   Vitals:   02/18/22 1745 02/18/22 2100 02/19/22 0000 02/19/22 0800  BP:  (!) 132/94 (!) 150/86 (!) 134/90  Pulse:  80 81 68  Resp:  20 (!) 23 20  Temp: 98.6 F (37 C) 97.8 F (36.6 C) 97.8 F (36.6 C) 98.1 F (36.7 C)  TempSrc: Oral Oral Oral Axillary  SpO2:  91% 95% 90%  Weight:      Height:        Wt Readings from Last 3 Encounters:  02/09/22 120 kg  02/08/22 118.4 kg  01/31/22 118.4 kg     Intake/Output Summary (Last 24 hours) at 02/19/2022 1601 Last data filed at 02/19/2022 0900 Gross per 24 hour  Intake 1445.58 ml  Output 1800 ml  Net -354.42 ml       Physical Exam   Alert, with significant aphasia, significant generalized weakness, ataxia, dyskinesia, very poor vision.   Symmetrical Chest wall movement, Good air movement bilaterally, CTAB RRR,No Gallops,Rubs or new Murmurs, No Parasternal Heave +ve B.Sounds, Abd Soft, No tenderness, No rebound - guarding or rigidity. No Cyanosis, Clubbing or edema, No new Rash or bruise         Data Review:    Recent Labs  Lab 02/13/22 0321 02/14/22 0516 02/15/22 0421 02/18/22 0211 02/19/22 0734  WBC 10.9* 8.2 8.3 10.8* 7.7  HGB 16.3 16.6 16.9 18.1* 18.2*  HCT 49.3 51.0 50.2 52.7* 54.2*  PLT 149* 158 130* 157 127*  MCV 89.8 89.3 87.9 87.5 88.6  MCH 29.7 29.1 29.6 30.1 29.7  MCHC 33.1 32.5 33.7 34.3 33.6  RDW 13.2 13.3 13.2 13.5 13.6  LYMPHSABS 0.5* 0.3* 0.3*  --   --   MONOABS 0.1 0.1 0.2  --   --   EOSABS 0.0 0.0 0.0  --   --   BASOSABS 0.0 0.0 0.0  --   --     Recent Labs  Lab 02/13/22 0321 02/14/22 0516 02/15/22 0956 02/18/22 0211 02/19/22 0906  NA 139 137 137 133* 133*  K 4.8 3.8 4.2 4.1 4.2  CL 103 102 98 100 98  CO2 25 25 26 22 25   GLUCOSE 178* 177* 198* 154* 136*  BUN 15 14 14 14 10   CREATININE 0.90 0.91 1.00 0.85 0.91  AST 35 32  42*  --   --   ALT 35 37 42  --   --   ALKPHOS 47 47 49  --   --   BILITOT 0.7 0.9 0.7  --   --   ALBUMIN 3.7 3.8 3.9  --   --   CRP <0.5 <0.5  --   --   --   BNP 433.0* 187.6*  --   --   --   MG 2.4 2.2  --   --   --   CALCIUM 8.7* 8.5* 9.1 8.3* 8.4*   ----------------------------------------------------------------------------------------------------------------- No results for input(s): "CHOL", "HDL", "LDLCALC", "TRIG", "CHOLHDL", "LDLDIRECT" in the last 72 hours.   Lab Results  Component Value Date   HGBA1C 6.2 (H) 02/10/2022  No results for input(s): "TSH", "T4TOTAL", "T3FREE", "THYROIDAB" in the last 72 hours.  Invalid input(s): "FREET3"     Radiology Reports CT CHEST ABDOMEN PELVIS W CONTRAST  Result Date: 02/18/2022 CLINICAL DATA:  Rapid neurologic deterioration, possibly paraneoplastic. Evaluate for malignancy. EXAM: CT CHEST, ABDOMEN, AND PELVIS WITH CONTRAST TECHNIQUE: Multidetector CT imaging of the chest, abdomen and pelvis was performed following the standard protocol during bolus administration of intravenous contrast. RADIATION DOSE REDUCTION: This exam was performed according to the departmental dose-optimization program which includes automated exposure control, adjustment of the mA and/or kV according to patient size and/or use of iterative reconstruction technique. 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 Mitral valve calcifications. Heart is normal in size. No pericardial effusion. Mediastinum/Nodes: No enlarged mediastinal, hilar, or axillary lymph nodes. Thyroid gland, trachea, and esophagus demonstrate no significant findings. Lungs/Pleura: Lungs are clear. No pleural effusion or pneumothorax. Musculoskeletal: Postoperative changes of median sternotomy. No acute or suspicious osseous  finding. CT ABDOMEN PELVIS FINDINGS Hepatobiliary: No focal liver abnormality is seen. No gallstones, gallbladder wall thickening, or biliary dilatation. Pancreas: Unremarkable. No pancreatic ductal dilatation or surrounding inflammatory changes. Spleen: Borderline splenomegaly, measuring 13. 8 cm in length (series 6, image 78). No focal abnormality of the spleen. Adrenals/Urinary Tract: A 1.5 cm mass in the body of the left adrenal gland demonstrates attenuation of 46 Hounsfield units (series 3, image 67; series 6, image 65). Normal right adrenal gland. A 1.9 cm exophytic mass demonstrates attenuation of 45 Hounsfield units at the lower pole of the left kidney (series 3, image 85). An additional 1.0 cm exophytic mass at the lower pole of the left kidney demonstrates attenuation of 24 Hounsfield units, best appreciated on coronal series 6, image 63 (axial series 3, image 89). A 8.8 cm fluid attenuation exophytic cortical cyst is seen at the lower pole of the right kidney (series 3, image 89). No hydronephrosis bilaterally. Urinary bladder is unremarkable. Stomach/Bowel: Stomach is within normal limits. Appendix appears normal. No evidence of bowel wall thickening, distention, or inflammatory changes. Vascular/Lymphatic: No significant vascular findings are present. No enlarged abdominal or pelvic lymph nodes. Reproductive: Prostate is unremarkable. Other: Small fat containing left inguinal hernia. No abdominopelvic ascites. Musculoskeletal: No acute or suspicious osseous findings. IMPRESSION: 1. Indeterminate 1.5 cm mass in the body of the left adrenal gland. When the patient is clinically stable and able to follow directions and hold their breath (preferably as an outpatient) further evaluation with dedicated adrenal protocol MRI should be considered. 2. Two indeterminate left renal masses measuring up to 1.9 cm. When the patient is clinically stable and able to follow directions and hold their breath (preferably  as an outpatient) further evaluation with dedicated abdominal MRI should be considered. 3. Borderline splenomegaly. 4. No CT findings suspicious for malignancy in the chest. Electronically Signed   By: LIleana RoupM.D.   On: 02/18/2022 15:26      Signature  -   DPhillips ClimesM.D on 02/19/2022 at 4:01 PM   -  To page go to [email or web address]

## 2022-02-19 NOTE — Progress Notes (Signed)
Plan of care:   Spoke with patient's sister Pamala Hurry over phone. Patient's CJD testing with elevated protein 14-3-3 and elevated T tau with positive RT-Quic.  This confirms our suspicion for CJD. I am going to stop IVIG. I spoke with patient's sister Pamala Hurry over phone and gave her an update.  I strongly recommend palliative treatment going forward. We will signoff. Please feel free to contact us with any questions or concerns.  [place] Pager Number [phone]

## 2022-02-20 DIAGNOSIS — A8109 Other Creutzfeldt-Jakob disease: Secondary | ICD-10-CM

## 2022-02-20 DIAGNOSIS — Z515 Encounter for palliative care: Secondary | ICD-10-CM | POA: Diagnosis not present

## 2022-02-20 DIAGNOSIS — A8100 Creutzfeldt-Jakob disease, unspecified: Secondary | ICD-10-CM | POA: Diagnosis not present

## 2022-02-20 DIAGNOSIS — R270 Ataxia, unspecified: Secondary | ICD-10-CM | POA: Diagnosis not present

## 2022-02-20 DIAGNOSIS — H5347 Heteronymous bilateral field defects: Secondary | ICD-10-CM | POA: Diagnosis not present

## 2022-02-20 LAB — BASIC METABOLIC PANEL
Anion gap: 8 (ref 5–15)
BUN: 9 mg/dL (ref 6–20)
CO2: 24 mmol/L (ref 22–32)
Calcium: 8.5 mg/dL — ABNORMAL LOW (ref 8.9–10.3)
Chloride: 100 mmol/L (ref 98–111)
Creatinine, Ser: 1 mg/dL (ref 0.61–1.24)
GFR, Estimated: >60 mL/min (ref >60)
Glucose, Bld: 144 mg/dL — ABNORMAL HIGH (ref 70–99)
Potassium: 4 mmol/L (ref 3.5–5.1)
Sodium: 132 mmol/L — ABNORMAL LOW (ref 135–145)

## 2022-02-20 LAB — CBC
HCT: 51 % (ref 39.0–52.0)
Hemoglobin: 17.4 g/dL — ABNORMAL HIGH (ref 13.0–17.0)
MCH: 29.9 pg (ref 26.0–34.0)
MCHC: 34.1 g/dL (ref 30.0–36.0)
MCV: 87.6 fL (ref 80.0–100.0)
Platelets: 140 10*3/uL — ABNORMAL LOW (ref 150–400)
RBC: 5.82 MIL/uL — ABNORMAL HIGH (ref 4.22–5.81)
RDW: 13.6 % (ref 11.5–15.5)
WBC: 9.1 10*3/uL (ref 4.0–10.5)
nRBC: 0 % (ref 0.0–0.2)

## 2022-02-20 LAB — GLUCOSE, CAPILLARY
Glucose-Capillary: 160 mg/dL — ABNORMAL HIGH (ref 70–99)
Glucose-Capillary: 156 mg/dL — ABNORMAL HIGH (ref 70–99)
Glucose-Capillary: 151 mg/dL — ABNORMAL HIGH (ref 70–99)
Glucose-Capillary: 244 mg/dL — ABNORMAL HIGH (ref 70–99)

## 2022-02-20 NOTE — Progress Notes (Signed)
                                                     Palliative Care Progress Note, Assessment & Plan   Patient Name: Steve Richards       Date: 02/20/2022 DOB: 12/22/1965  Age: 56 y.o. MRN#: [id number] Attending Physician: Albertine Patricia, MD Primary Care Physician: Girtha Rm, NP-C Admit Date: 02/08/2022  Subjective: He was able to communicate with blinking today.  He appears more weak and less able to engage in discussions with me today.  HPI: After reviewing the patient's chart and assessing the patient at bedside, I spoke with Dr. Waldron Labs in regards to plan of care.  Patient is having a rapid decline but appears not to be suffering. He is able to continue to communicate with me that he is not in pain or discomfort.   Discussed with Dr. Waldron Labs my recommendation to transition patient to comfort care once sister arrives.  Continuing blood draws and vital sign checks is futile given patient's diagnosis, prognosis, and rapid decline.   I also believe patient is a candidate for hospice inpatient evaluation given his rapid decline and lack of support for 24/7 care at home.  Patient's sister plans to be here on Thursday.  Plan is to speak with patient's sister regarding comfort measures and hospice once she arrives on Thursday. I attempted to speak with her over the phone. No answer. HIPAA appropriate voicemail left.   No changes to plan of care currently. DNR remains.   Physical Exam Vitals reviewed.  Constitutional:      General: He is not in acute distress.    Appearance: He is ill-appearing.  HENT:     Head: Normocephalic.     Mouth/Throat:     Mouth: Mucous membranes are moist.  Cardiovascular:     Rate and Rhythm: Normal rate.     Pulses: Normal pulses.  Pulmonary:     Effort: Pulmonary effort is normal.  Abdominal:      Palpations: Abdomen is soft.  Musculoskeletal:     Comments: Cannot MAETC, generalized weakness  Skin:    General: Skin is warm and dry.             Palliative Assessment/Data: 30%    Total Time 25 minutes   Thank you for allowing the Palliative Medicine Team to assist in the care of this patient.  Parlier Ilsa Iha, FNP-BC Palliative Medicine Team Team Phone # [phone]

## 2022-02-20 NOTE — Telephone Encounter (Signed)
Called sister Pamala Hurry and informed her of providers comments. 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 Now that they have a confirmed a diagnosis for him, she has decided not to move him to [facility] and will just work on making him comfortable. I advised her that hospital should be working with her on these things but if there is anything that we can do to help them out and make things easier please let us know.

## 2022-02-20 NOTE — Progress Notes (Signed)
PROGRESS NOTE                                                                                                                                                                                                             Patient Demographics:    Steve Richards, is a 56 y.o. male, DOB - 03/04/1966, TOI:[id number]  Outpatient Primary MD for the patient is Girtha Rm, NP-C    LOS - 53  Admit date - 02/08/2022    Chief Complaint  Patient presents with   Stroke Symptoms       Brief Narrative (HPI from H&P)      56 y.o. male with PMH significant for hepatic cirrhosis, morbid obesity, aortic valve replacement with bioprosthetic valve, who presents with word finding difficulty, tremor and vision abnormalities.  He has been having some weakness in his left hand for the past few months had some outpatient neurological workup initially few months ago but could not remain compliant with it.  For the last few weeks he has been having some tremor-like activity in his right hand, he has developed right eye visual problem for which she saw an ophthalmologist and was noted to find right-sided hemianopsia, he also has noticed some problems finding words.  Presented to the ER where MRI was concerning for subtle cortical ribboning suspicious for autoimmune, paraneoplastic etiology vs could be CJD or infectious.  He was seen by neurologist and admitted to the hospital.  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  Which confirms our suspicion for CJD    Subjective:    No significant events overnight, patient currently will need assistance with all of his activities.     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  Which confirms our suspicion for CJD -Palliative medicine following  HX  of bioprosthetic AVR.   - Monitor with supportive care.  Stable prosthesis in aortic valve physiology on last echo few months ago.  Chronic diastolic CHF EF of 18% on echo done few months ago.   -Compensated.   Obesity.   - BMI of 35.  Follow-up with PCP.  History of cirrhosis of liver.  Question NASH.   - Stable INR and LFTs, outpatient GI workup.    Polycythemia.   - Likely due to OSA, monitor.  Small stable subarachnoid hemorrhage noted on MRI.   - Symptom-free.  Monitor.  Holding aspirin or anticoagulation for now.  Hypothyroidism.   - Home dose Synthroid increased, repeat TSH and free T4 in 3 to 4 weeks.  DM type II.  SSI.  Lab Results  Component Value Date   HGBA1C 6.2 (H) 02/10/2022   CBG (last 3)  Recent Labs    02/19/22 2033 02/20/22 0804 02/20/22 1220  GLUCAP 147* 160* 156*        Condition - poor prognosis  Family Communication  :  None at bedside, I have discussed with his sister by phone today.  Code Status : DNR   Consults  :  Neuro  PUD Prophylaxis :    Procedures  :     LP 02/09/22  MRI - 1. Small 14 mm focus suspicious for subarachnoid hemorrhage at the right vertex adjacent to the falx. This was subtle on the earlier CT, not apparent on MRI last month. Etiology and significance is unclear, has there been recent fall? No other acute intracranial hemorrhage is identified, but there is a small number of nonspecific chronic microhemorrhages in both hemispheres. 2. Otherwise a largely  unremarkable MRI appearance of the brain, HOWEVER, suspicion of subtle abnormal cortical diffusion widely scattered in the left hemisphere. Although with no cortical edema or abnormal signal on the remaining sequences. No enhancement. Given the clinical suspicion Creutzfeldt-Jakob Disease cannot be excluded. Differential diagnosis would include autoimmune or paraneoplastic encephalitis       Disposition Plan  :    Status is: Inpatient  DVT Prophylaxis  :  Place and maintain sequential compression device Start: 02/09/22 1757   Lab Results  Component Value Date   PLT 140 (L) 02/20/2022    Diet :  Diet Order             Diet regular Room service appropriate? Yes with Assist; Fluid consistency: Thin  Diet effective now                    Inpatient Medications  Scheduled Meds:  docusate sodium  200 mg Oral BID   insulin aspart  0-15 Units Subcutaneous TID WC   insulin aspart  0-5 Units Subcutaneous QHS   levothyroxine  100 mcg Oral Daily   multivitamin with minerals  1 tablet Oral Daily   pantoprazole  40 mg Oral Daily   polyethylene glycol  17 g Oral BID   Continuous Infusions:  Immune Globulin 10% 240 mL/hr at 02/19/22 2035   lactated ringers 75 mL/hr at 02/20/22 0442    PRN Meds:.acetaminophen **OR** acetaminophen (TYLENOL) oral liquid 160 mg/5 mL **OR** acetaminophen, melatonin, senna-docusate  Antibiotics  :    Anti-infectives (From admission, onward)    None         Objective:   Vitals:   02/19/22 2019 02/19/22 2035 02/20/22 0805 02/20/22 1220  BP: (!) 152/98 (!) 141/92 (!) 140/99 (!) 150/102  Pulse: 88 86 84 85  Resp: 13  13 19 15   Temp: 98.7 F (37.1 C) 98.8 F (37.1 C) 97.7 F (36.5 C) 97.7 F (36.5 C)  TempSrc: Axillary Axillary Oral Axillary  SpO2: 96% 96% 95%   Weight:      Height:        Wt Readings from Last 3 Encounters:  02/09/22 120 kg  02/08/22 118.4 kg  01/31/22 118.4 kg     Intake/Output Summary (Last 24 hours) at  02/20/2022 1227 Last data filed at 02/20/2022 1018 Gross per 24 hour  Intake --  Output 2350 ml  Net -2350 ml       Physical Exam   Alert, with significant aphasia, significant generalized weakness, ataxia, dyskinesia, very poor vision.   Symmetrical Chest wall movement, Good air movement bilaterally, CTAB RRR,No Gallops,Rubs or new Murmurs, No Parasternal Heave +ve B.Sounds, Abd Soft, No tenderness, No rebound - guarding or rigidity. No Cyanosis, Clubbing or edema, No new Rash or bruise         Data Review:    Recent Labs  Lab 02/14/22 0516 02/15/22 0421 02/18/22 0211 02/19/22 0734 02/20/22 0438  WBC 8.2 8.3 10.8* 7.7 9.1  HGB 16.6 16.9 18.1* 18.2* 17.4*  HCT 51.0 50.2 52.7* 54.2* 51.0  PLT 158 130* 157 127* 140*  MCV 89.3 87.9 87.5 88.6 87.6  MCH 29.1 29.6 30.1 29.7 29.9  MCHC 32.5 33.7 34.3 33.6 34.1  RDW 13.3 13.2 13.5 13.6 13.6  LYMPHSABS 0.3* 0.3*  --   --   --   MONOABS 0.1 0.2  --   --   --   EOSABS 0.0 0.0  --   --   --   BASOSABS 0.0 0.0  --   --   --     Recent Labs  Lab 02/14/22 0516 02/15/22 0956 02/18/22 0211 02/19/22 0906 02/20/22 0438  NA 137 137 133* 133* 132*  K 3.8 4.2 4.1 4.2 4.0  CL 102 98 100 98 100  CO2 25 26 22 25 24   GLUCOSE 177* 198* 154* 136* 144*  BUN 14 14 14 10 9   CREATININE 0.91 1.00 0.85 0.91 1.00  AST 32 42*  --   --   --   ALT 37 42  --   --   --   ALKPHOS 47 49  --   --   --   BILITOT 0.9 0.7  --   --   --   ALBUMIN 3.8 3.9  --   --   --   CRP <0.5  --   --   --   --   BNP 187.6*  --   --   --   --   MG 2.2  --   --   --   --   CALCIUM 8.5* 9.1 8.3* 8.4* 8.5*   ----------------------------------------------------------------------------------------------------------------- No results for input(s): "CHOL", "HDL", "LDLCALC", "TRIG", "CHOLHDL", "LDLDIRECT" in the last 72 hours.   Lab Results  Component Value Date   HGBA1C 6.2 (H) 02/10/2022    No results for input(s): "TSH", "T4TOTAL", "T3FREE",  "THYROIDAB" in the last 72 hours.  Invalid input(s): "FREET3"     Radiology Reports CT CHEST ABDOMEN PELVIS W CONTRAST  Result Date: 02/18/2022 CLINICAL DATA:  Rapid neurologic deterioration, possibly paraneoplastic. Evaluate for malignancy. EXAM: CT CHEST, ABDOMEN, AND PELVIS WITH CONTRAST TECHNIQUE: Multidetector CT imaging of the chest, abdomen and pelvis was performed following the standard protocol during bolus administration of intravenous contrast. RADIATION DOSE REDUCTION: This exam was performed according to  the departmental dose-optimization program which includes automated exposure control, adjustment of the mA and/or kV according to patient size and/or use of iterative reconstruction technique. 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 Mitral valve calcifications. Heart is normal in size. No pericardial effusion. Mediastinum/Nodes: No enlarged mediastinal, hilar, or axillary lymph nodes. Thyroid gland, trachea, and esophagus demonstrate no significant findings. Lungs/Pleura: Lungs are clear. No pleural effusion or pneumothorax. Musculoskeletal: Postoperative changes of median sternotomy. No acute or suspicious osseous finding. CT ABDOMEN PELVIS FINDINGS Hepatobiliary: No focal liver abnormality is seen. No gallstones, gallbladder wall thickening, or biliary dilatation. Pancreas: Unremarkable. No pancreatic ductal dilatation or surrounding inflammatory changes. Spleen: Borderline splenomegaly, measuring 13. 8 cm in length (series 6, image 78). No focal abnormality of the spleen. Adrenals/Urinary Tract: A 1.5 cm mass in the body of the left adrenal gland demonstrates attenuation of 46 Hounsfield units (series 3, image 67; series 6, image 65). Normal right adrenal gland. A 1.9 cm exophytic mass demonstrates attenuation of 45  Hounsfield units at the lower pole of the left kidney (series 3, image 85). An additional 1.0 cm exophytic mass at the lower pole of the left kidney demonstrates attenuation of 24 Hounsfield units, best appreciated on coronal series 6, image 63 (axial series 3, image 89). A 8.8 cm fluid attenuation exophytic cortical cyst is seen at the lower pole of the right kidney (series 3, image 89). No hydronephrosis bilaterally. Urinary bladder is unremarkable. Stomach/Bowel: Stomach is within normal limits. Appendix appears normal. No evidence of bowel wall thickening, distention, or inflammatory changes. Vascular/Lymphatic: No significant vascular findings are present. No enlarged abdominal or pelvic lymph nodes. Reproductive: Prostate is unremarkable. Other: Small fat containing left inguinal hernia. No abdominopelvic ascites. Musculoskeletal: No acute or suspicious osseous findings. IMPRESSION: 1. Indeterminate 1.5 cm mass in the body of the left adrenal gland. When the patient is clinically stable and able to follow directions and hold their breath (preferably as an outpatient) further evaluation with dedicated adrenal protocol MRI should be considered. 2. Two indeterminate left renal masses measuring up to 1.9 cm. When the patient is clinically stable and able to follow directions and hold their breath (preferably as an outpatient) further evaluation with dedicated abdominal MRI should be considered. 3. Borderline splenomegaly. 4. No CT findings suspicious for malignancy in the chest. Electronically Signed   By: LIleana RoupM.D.   On: 02/18/2022 15:26      Signature  -   DPhillips ClimesM.D on 02/20/2022 at 12:27 PM   -  To page go to [email or web address]

## 2022-02-21 DIAGNOSIS — E118 Type 2 diabetes mellitus with unspecified complications: Secondary | ICD-10-CM | POA: Diagnosis not present

## 2022-02-21 DIAGNOSIS — H5347 Heteronymous bilateral field defects: Secondary | ICD-10-CM | POA: Diagnosis not present

## 2022-02-21 LAB — BASIC METABOLIC PANEL
Anion gap: 8 (ref 5–15)
BUN: 8 mg/dL (ref 6–20)
CO2: 25 mmol/L (ref 22–32)
Calcium: 8.6 mg/dL — ABNORMAL LOW (ref 8.9–10.3)
Chloride: 100 mmol/L (ref 98–111)
Creatinine, Ser: 0.83 mg/dL (ref 0.61–1.24)
GFR, Estimated: >60 mL/min (ref >60)
Glucose, Bld: 121 mg/dL — ABNORMAL HIGH (ref 70–99)
Potassium: 3.9 mmol/L (ref 3.5–5.1)
Sodium: 133 mmol/L — ABNORMAL LOW (ref 135–145)

## 2022-02-21 LAB — CBC
HCT: 52.6 % — ABNORMAL HIGH (ref 39.0–52.0)
Hemoglobin: 17.3 g/dL — ABNORMAL HIGH (ref 13.0–17.0)
MCH: 29.3 pg (ref 26.0–34.0)
MCHC: 32.9 g/dL (ref 30.0–36.0)
MCV: 89 fL (ref 80.0–100.0)
Platelets: 143 10*3/uL — ABNORMAL LOW (ref 150–400)
RBC: 5.91 MIL/uL — ABNORMAL HIGH (ref 4.22–5.81)
RDW: 13.7 % (ref 11.5–15.5)
WBC: 8 10*3/uL (ref 4.0–10.5)
nRBC: 0 % (ref 0.0–0.2)

## 2022-02-21 LAB — BETA ISOFORM (CREUTZFELDT-JAKOB DISEASE)

## 2022-02-21 LAB — GLUCOSE, CAPILLARY
Glucose-Capillary: 148 mg/dL — ABNORMAL HIGH (ref 70–99)
Glucose-Capillary: 155 mg/dL — ABNORMAL HIGH (ref 70–99)
Glucose-Capillary: 133 mg/dL — ABNORMAL HIGH (ref 70–99)
Glucose-Capillary: 241 mg/dL — ABNORMAL HIGH (ref 70–99)

## 2022-02-21 NOTE — Progress Notes (Signed)
OT Cancellation Note  Patient Details Name: Stella Encarnacion MRN: [id number] DOB: 06/30/1965   Cancelled Treatment:    Reason Eval/Treat Not Completed: Other (comment).  OT held due to patient's rapid deterioration, and family meeting to determine goals of care, and hospice.    Elroy Schembri D Kesler Wickham 02/21/2022, 10:33 AM 02/21/2022  RP, OTR/L  Acute Rehabilitation Services  Office:  [phone]

## 2022-02-21 NOTE — Progress Notes (Signed)
PROGRESS NOTE                                                                                                                                                                                                             Patient Demographics:    Steve Richards, is a 56 y.o. male, DOB - Aug 28, 1965, XNT:[id number]  Outpatient Primary MD for the patient is Girtha Rm, NP-C    LOS - 12  Admit date - 02/08/2022    Chief Complaint  Patient presents with   Stroke Symptoms       Brief Narrative   56 y.o. 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    Subjective:   No major issues overnight.   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 Neurology followed closely Palliative care following-sister scheduled to arrive from [place] tomorrow. Residential hospice hopefully after family/sister arrives.  HX of bioprosthetic AVR.   Monitor with supportive care.  Stable prosthesis in aortic valve physiology on last echo few months ago.  Chronic diastolic CHF EF of 96% on echo done few months ago.   Compensated.   Obesity.   BMI of 35.  Follow-up with PCP.  History of cirrhosis of liver.  Question NASH.   Stable INR and LFTs, outpatient GI workup.    Polycythemia.   Likely due to OSA, monitor.  Small stable subarachnoid hemorrhage noted on MRI.   Symptom-free.  Monitor.  Holding aspirin or anticoagulation for now.  Hypothyroidism.   Home dose Synthroid increased, repeat TSH and free T4 in 3 to 4 weeks.  DM type II.   SSI.  Lab Results  Component Value Date   HGBA1C 6.2 (H) 02/10/2022   CBG (last 3)   Recent Labs    02/20/22 1602 02/20/22 2150 02/21/22 0756  GLUCAP 151* 244* 148*   Obesity.   BMI of 35.  Follow-up with PCP.      Condition - poor prognosis  Family Communication  :  None at bedside  Code Status : DNR   Consults  :  Neuro  PUD Prophylaxis :    Procedures  :  LP 02/09/22  MRI - 1. Small 14 mm focus suspicious for subarachnoid hemorrhage at the right vertex adjacent to the falx. This was subtle on the earlier CT, not apparent on MRI last month. Etiology and significance is unclear, has there been recent fall? No other acute intracranial hemorrhage is identified, but there is a small number of nonspecific chronic microhemorrhages in both hemispheres. 2. Otherwise a largely unremarkable MRI appearance of the brain, HOWEVER, suspicion of subtle abnormal cortical diffusion widely scattered in the left hemisphere. Although with no cortical edema or abnormal signal on the remaining sequences. No enhancement. Given the clinical suspicion Creutzfeldt-Jakob Disease cannot be excluded. Differential diagnosis would include autoimmune or paraneoplastic encephalitis       Disposition Plan  :    Status is: Inpatient  DVT Prophylaxis  :  Place and maintain sequential compression device Start: 02/09/22 1757   Lab Results  Component Value Date   PLT 143 (L) 02/21/2022    Diet :  Diet Order             Diet regular Room service appropriate? Yes with Assist; Fluid consistency: Thin  Diet effective now                    Inpatient Medications  Scheduled Meds:  docusate sodium  200 mg Oral BID   insulin aspart  0-15 Units Subcutaneous TID WC   insulin aspart  0-5 Units Subcutaneous QHS   levothyroxine  100 mcg Oral Daily   multivitamin with minerals  1 tablet Oral Daily   pantoprazole  40 mg Oral Daily   polyethylene glycol  17 g Oral BID   Continuous Infusions:  lactated ringers 50 mL/hr at 02/20/22 1420    PRN Meds:.acetaminophen **OR**  acetaminophen (TYLENOL) oral liquid 160 mg/5 mL **OR** acetaminophen, melatonin, senna-docusate  Antibiotics  :    Anti-infectives (From admission, onward)    None         Objective:   Vitals:   02/20/22 2000 02/21/22 0051 02/21/22 0352 02/21/22 0755  BP: (!) 161/98 (!) 134/94 (!) 140/98 129/89  Pulse: 92 76 71 74  Resp: 18 10 20 20   Temp: 99.2 F (37.3 C) 99.6 F (37.6 C) 97.9 F (36.6 C) 97.7 F (36.5 C)  TempSrc: Axillary Axillary Oral Axillary  SpO2: 98% 96% 96% 99%  Weight:      Height:        Wt Readings from Last 3 Encounters:  02/09/22 120 kg  02/08/22 118.4 kg  01/31/22 118.4 kg    No intake or output data in the 24 hours ending 02/21/22 1032   Physical Exam Gen Exam:not in any distress HEENT:atraumatic, normocephalic Chest: B/L clear to auscultation anteriorly CVS:S1S2 regular Abdomen:soft non tender, non distended Extremities:no edema Skin: no rash     Data Review:    Recent Labs  Lab 02/15/22 0421 02/18/22 0211 02/19/22 0734 02/20/22 0438 02/21/22 0245  WBC 8.3 10.8* 7.7 9.1 8.0  HGB 16.9 18.1* 18.2* 17.4* 17.3*  HCT 50.2 52.7* 54.2* 51.0 52.6*  PLT 130* 157 127* 140* 143*  MCV 87.9 87.5 88.6 87.6 89.0  MCH 29.6 30.1 29.7 29.9 29.3  MCHC 33.7 34.3 33.6 34.1 32.9  RDW 13.2 13.5 13.6 13.6 13.7  LYMPHSABS 0.3*  --   --   --   --   MONOABS 0.2  --   --   --   --   EOSABS 0.0  --   --   --   --  BASOSABS 0.0  --   --   --   --      Recent Labs  Lab 02/15/22 0956 02/18/22 0211 02/19/22 0906 02/20/22 0438 02/21/22 0245  NA 137 133* 133* 132* 133*  K 4.2 4.1 4.2 4.0 3.9  CL 98 100 98 100 100  CO2 26 22 25 24 25   GLUCOSE 198* 154* 136* 144* 121*  BUN 14 14 10 9 8   CREATININE 1.00 0.85 0.91 1.00 0.83  AST 42*  --   --   --   --   ALT 42  --   --   --   --   ALKPHOS 49  --   --   --   --   BILITOT 0.7  --   --   --   --   ALBUMIN 3.9  --   --   --   --   CALCIUM 9.1 8.3* 8.4* 8.5* 8.6*     ----------------------------------------------------------------------------------------------------------------- No results for input(s): "CHOL", "HDL", "LDLCALC", "TRIG", "CHOLHDL", "LDLDIRECT" in the last 72 hours.   Lab Results  Component Value Date   HGBA1C 6.2 (H) 02/10/2022    No results for input(s): "TSH", "T4TOTAL", "T3FREE", "THYROIDAB" in the last 72 hours.  Invalid input(s): "FREET3"     Radiology Reports CT CHEST ABDOMEN PELVIS W CONTRAST  Result Date: 02/18/2022 CLINICAL DATA:  Rapid neurologic deterioration, possibly paraneoplastic. Evaluate for malignancy. EXAM: CT CHEST, ABDOMEN, AND PELVIS WITH CONTRAST TECHNIQUE: Multidetector CT imaging of the chest, abdomen and pelvis was performed following the standard protocol during bolus administration of intravenous contrast. RADIATION DOSE REDUCTION: This exam was performed according to the departmental dose-optimization program which includes automated exposure control, adjustment of the mA and/or kV according to patient size and/or use of iterative reconstruction technique. 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 Mitral valve calcifications. Heart is normal in size. No pericardial effusion. Mediastinum/Nodes: No enlarged mediastinal, hilar, or axillary lymph nodes. Thyroid gland, trachea, and esophagus demonstrate no significant findings. Lungs/Pleura: Lungs are clear. No pleural effusion or pneumothorax. Musculoskeletal: Postoperative changes of median sternotomy. No acute or suspicious osseous finding. CT ABDOMEN PELVIS FINDINGS Hepatobiliary: No focal liver abnormality is seen. No gallstones, gallbladder wall thickening, or biliary dilatation. Pancreas: Unremarkable. No pancreatic ductal dilatation or surrounding inflammatory changes. Spleen:  Borderline splenomegaly, measuring 13. 8 cm in length (series 6, image 78). No focal abnormality of the spleen. Adrenals/Urinary Tract: A 1.5 cm mass in the body of the left adrenal gland demonstrates attenuation of 46 Hounsfield units (series 3, image 67; series 6, image 65). Normal right adrenal gland. A 1.9 cm exophytic mass demonstrates attenuation of 45 Hounsfield units at the lower pole of the left kidney (series 3, image 85). An additional 1.0 cm exophytic mass at the lower pole of the left kidney demonstrates attenuation of 24 Hounsfield units, best appreciated on coronal series 6, image 63 (axial series 3, image 89). A 8.8 cm fluid attenuation exophytic cortical cyst is seen at the lower pole of the right kidney (series 3, image 89). No hydronephrosis bilaterally. Urinary bladder is unremarkable. Stomach/Bowel: Stomach is within normal limits. Appendix appears normal. No evidence of bowel wall thickening, distention, or inflammatory changes. Vascular/Lymphatic: No significant vascular findings are present. No enlarged abdominal or pelvic lymph nodes. Reproductive: Prostate is unremarkable. Other: Small fat containing left inguinal hernia. No abdominopelvic  ascites. Musculoskeletal: No acute or suspicious osseous findings. IMPRESSION: 1. Indeterminate 1.5 cm mass in the body of the left adrenal gland. When the patient is clinically stable and able to follow directions and hold their breath (preferably as an outpatient) further evaluation with dedicated adrenal protocol MRI should be considered. 2. Two indeterminate left renal masses measuring up to 1.9 cm. When the patient is clinically stable and able to follow directions and hold their breath (preferably as an outpatient) further evaluation with dedicated abdominal MRI should be considered. 3. Borderline splenomegaly. 4. No CT findings suspicious for malignancy in the chest. Electronically Signed   By: Ileana Roup M.D.   On: 02/18/2022 15:26       Signature  -   Oren Binet M.D on 02/21/2022 at 10:32 AM   -  To page go to [email or web address]

## 2022-02-21 NOTE — Progress Notes (Signed)
SLP Cancellation Note  Patient Details Name: Steve Richards MRN: [id number] DOB: Jan 07, 1966   Cancelled treatment:       Reason Eval/Treat Not Completed: Medical issues which prohibited therapy. Patient is unfortunately rapidly declining, diagnosis of CJD. Family in process of making [place] decisions with likely hospice involvement. SLP to s/o at this time.   Sonia Baller, MA, CCC-SLP Speech Therapy

## 2022-02-21 NOTE — Plan of Care (Signed)
  Problem: Coping: Goal: Ability to adjust to condition or change in health will improve Outcome: Progressing   Problem: Nutritional: Goal: Maintenance of adequate nutrition will improve Outcome: Progressing   

## 2022-02-22 ENCOUNTER — Encounter: Payer: Commercial Managed Care - HMO | Admitting: Occupational Therapy

## 2022-02-22 DIAGNOSIS — R0609 Other forms of dyspnea: Secondary | ICD-10-CM

## 2022-02-22 DIAGNOSIS — R0600 Dyspnea, unspecified: Secondary | ICD-10-CM

## 2022-02-22 DIAGNOSIS — E118 Type 2 diabetes mellitus with unspecified complications: Secondary | ICD-10-CM | POA: Diagnosis not present

## 2022-02-22 DIAGNOSIS — H5347 Heteronymous bilateral field defects: Secondary | ICD-10-CM | POA: Diagnosis not present

## 2022-02-22 DIAGNOSIS — Z515 Encounter for palliative care: Secondary | ICD-10-CM

## 2022-02-22 DIAGNOSIS — A8100 Creutzfeldt-Jakob disease, unspecified: Secondary | ICD-10-CM

## 2022-02-22 DIAGNOSIS — R52 Pain, unspecified: Secondary | ICD-10-CM

## 2022-02-22 LAB — GLUCOSE, CAPILLARY
Glucose-Capillary: 142 mg/dL — ABNORMAL HIGH (ref 70–99)
Glucose-Capillary: 151 mg/dL — ABNORMAL HIGH (ref 70–99)

## 2022-02-22 MED ORDER — ENSURE ENLIVE PO LIQD
237.0000 mL | Freq: Two times a day (BID) | ORAL | Status: DC
  Administered 2022-02-22: 237 mL via ORAL

## 2022-02-22 MED ORDER — LORAZEPAM 1 MG PO TABS: 1.0000 mg | ORAL_TABLET | ORAL | Status: DC | PRN

## 2022-02-22 MED ORDER — MORPHINE SULFATE (CONCENTRATE) 10 MG/0.5ML PO SOLN
5.0000 mg | ORAL | Status: DC | PRN
  Administered 2022-02-23: 5 mg via SUBLINGUAL
  Filled 2022-02-22: qty 0.5

## 2022-02-22 NOTE — Progress Notes (Signed)
Daily Progress Note   Patient Name: Steve Richards       Date: 02/22/2022 DOB: 1965/07/17  Age: 56 y.o. MRN#: [id number] Attending Physician: Jonetta Osgood, MD Primary Care Physician: Girtha Rm, NP-C Admit Date: 02/08/2022  Reason for Consultation/Follow-up: GOC/End of Life  HPI: 56 year old male with PMH significant for combined CHF, DM, hepatic cirrhosis with ascites, morbid obesity, aortic valve replacement with bioprosthetic valve. Presented initially to ED on 02/09/2022 with progressive neurological symptoms including diplopia, ataxia and aphasia. Has undergone extensive neurological work-up/imaging and has been diagnosed with CJD. Progressive decline in neurological status since admission to hospital.  Extensive chart review has been completed prior to meeting patient including labs, vital signs, imaging, progress notes, orders, and available advanced directive documents from current and previous encounters.    Subjective/Assessment: Mr. Portilla lying in bed, appears comfortable at this time. Able to make eye contact and nod appropriately to simple questions. Appears drowsy and reports of intermittent myoclonus. Denies current pain or discomfort with head nod.  Pamala Hurry (sister), Truman Hayward (brother) and two ladies (close friends) at bedside during time of visit. Pamala Hurry and Truman Hayward recently arrived from out of state. Friends have been at bedside rotating shifts since admission. All express that Mr. Digiulio is a loving and compassionate man that had many friends. 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  Discussed with those present the expected progressive decline associated with CJD. Sister, brother and friends expressed understanding of disease process and attest witness to  progressive neurological decline since hospital admission.  Presented options regarding transitioning to comfort care and/or residential hospice. Education provided on expectations surrounding end of life care. Pamala Hurry (sister) expresses desire to ensure Mr. Jurewicz is made comfortable during his transition to end of life and expresses interest in Residential Hospice, prefers [facility] as she will be staying in [place] and most friends live local to the area that will be visiting. Expressed interest in transitioning to comfort care status now while awaiting referrals and hospice placement.  MOST form completed to reflect wishes to transition to comfort measures only. MOST form placed in chart.  Vital Signs: BP (!) 138/95 (BP Location: Left Arm)   Pulse 76   Temp 98.5 F (36.9 C) (Axillary)   Resp 19   Ht 6' (1.829  m)   Wt 120 kg   SpO2 94%   BMI 35.88 kg/m  SpO2: SpO2: 94 % O2 Device: O2 Device: Room Air O2 Flow Rate: O2 Flow Rate (L/min): 2 L/min   Patient Active Problem List   Diagnosis Date Noted   Hemianopsia 02/09/2022   Stroke-like symptoms 02/09/2022   SAH (subarachnoid hemorrhage) ([place]) 02/09/2022   Controlled type 2 diabetes mellitus with complication, without long-term current use of insulin ([place]) 11/21/2021   Refusal of statin medication by patient 11/21/2021   Vitamin D deficiency 11/20/2021   Elevated hemoglobin A1c 11/20/2021   Hypertriglyceridemia 11/20/2021   Hyperlipidemia associated with type 2 diabetes mellitus ([place]) 11/20/2021   Hypothyroidism 12/10/2019   S/P AVR 08/07/2019   S/P aortic valve replacement with bioprosthetic valve 08/06/2019   Aortic valve stenosis 08/06/2019   Severe aortic stenosis    New onset of congestive heart failure ([place]) 07/21/2019   Cellulitis and abscess of left lower extremity 07/21/2019   Hyperglycemia 07/21/2019   Class 2 obesity due to excess calories with body mass index (BMI) of 35.0 to 35.9 in adult 07/21/2019   Ascites  07/15/2019   Hepatic cirrhosis ([place]) 07/15/2019   Pleural effusion on right 07/15/2019   Left kidney mass 07/15/2019   Splenomegaly 16/12/9602   Systolic ejection murmur 54/11/8117    Palliative Care Assessment & Plan   Recommendation/Plan  Comfort measure orders placed including symptomatic  Education offered to family regarding medication management related to symptoms at end of life Transition to Care order placed to assist with Residential Hospice Placement   GOC/Code Status: DNR MOST form completed and placed in chart  Prognosis:  < 2 weeks  Discharge Planning: Hospice facility  Care plan was discussed with family and support persons at bedside. Informed nursing staff and medical team of orders placed to transition to comfort care with goal of discharging to residential hospice.  Thank you for allowing the Palliative Medicine Team to assist in the care of this patient.  Total time:  50 minutes  Greater than 50%  of this time was spent counseling and coordinating care related to the above assessment and plan.  Theodoro Grist, DNP, AGNP-C Palliative Medicine   Please contact Palliative Medicine Team phone at [phone] for questions and concerns.

## 2022-02-22 NOTE — Progress Notes (Signed)
PROGRESS NOTE                                                                                                                                                                                                             Patient Demographics:    Steve Richards, is a 56 y.o. male, DOB - 05-Dec-1965, JOI:[id number]  Outpatient Primary MD for the patient is Girtha Rm, NP-C    LOS - 71  Admit date - 02/08/2022    Chief Complaint  Patient presents with   Stroke Symptoms       Brief Narrative   56 y.o. male with PMH significant for hepatic cirrhosis, morbid obesity, aortic valve replacement with bioprosthetic valve who presented with aphasia/ataxia/hemianopsia-underwent extensive workup-including extensive neuroimaging/CSF analysis-unfortunately was diagnosed with CJD.    Subjective:   Remains unchanged-sleepy-drowsy-continues to have intermittent myoclonus.   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 Rapidly deteriorating neurological status over the past several weeks. Palliative care meeting with sister today Suspect will need residential hospice given rapid deterioration of neurological status-poor oral intake over the past several days.  HX of bioprosthetic AVR.   Monitor with supportive care.  Stable prosthesis in aortic valve physiology on last echo few months ago.  Chronic diastolic CHF EF of 47% on echo done few months ago.   Compensated.   Obesity.   BMI of 35.  Follow-up with PCP.  History of cirrhosis of liver.  Question NASH.   Stable INR and LFTs, outpatient GI workup.    Polycythemia.   Likely due to OSA, monitor.  Small stable subarachnoid hemorrhage noted on MRI.   Symptom-free.  Monitor.  Holding aspirin or anticoagulation for now.  Hypothyroidism.    Home dose Synthroid increased, repeat TSH and free T4 in 3 to 4 weeks.  DM type II.   SSI.  Lab Results  Component Value Date   HGBA1C 6.2 (H) 02/10/2022   CBG (last 3)  Recent Labs    02/21/22 1612 02/21/22 2136 02/22/22 0758  GLUCAP 133* 241* 142*   Obesity.   BMI of 35.  Follow-up with PCP.      Condition - poor prognosis  Family Communication  :  None at bedside  Code Status : DNR   Consults  :  Neuro,Palliative care  PUD Prophylaxis :    Procedures  :     LP 02/09/22  MRI - 1. Small 14 mm focus suspicious for subarachnoid hemorrhage at the right vertex adjacent to the falx. This was subtle on the earlier CT, not apparent on MRI last month. Etiology and significance is unclear, has there been recent fall? No other acute intracranial hemorrhage is identified, but there is a small number of nonspecific chronic microhemorrhages in both hemispheres. 2. Otherwise a largely unremarkable MRI appearance of the brain, HOWEVER, suspicion of subtle abnormal cortical diffusion widely scattered in the left hemisphere. Although with no cortical edema or abnormal signal on the remaining sequences. No enhancement. Given the clinical suspicion Creutzfeldt-Jakob Disease cannot be excluded. Differential diagnosis would include autoimmune or paraneoplastic encephalitis       Disposition Plan  :    Status is: Inpatient  DVT Prophylaxis  :  Place and maintain sequential compression device Start: 02/09/22 1757   Lab Results  Component Value Date   PLT 143 (L) 02/21/2022    Diet :  Diet Order             Diet regular Room service appropriate? Yes with Assist; Fluid consistency: Thin  Diet effective now                    Inpatient Medications  Scheduled Meds:  docusate sodium  200 mg Oral BID   insulin aspart  0-15 Units Subcutaneous TID WC   insulin aspart  0-5 Units Subcutaneous QHS   levothyroxine  100 mcg Oral Daily   multivitamin with minerals  1 tablet Oral  Daily   pantoprazole  40 mg Oral Daily   polyethylene glycol  17 g Oral BID   Continuous Infusions:  lactated ringers 50 mL/hr at 02/21/22 1922    PRN Meds:.acetaminophen **OR** acetaminophen (TYLENOL) oral liquid 160 mg/5 mL **OR** acetaminophen, melatonin, senna-docusate  Antibiotics  :    Anti-infectives (From admission, onward)    None         Objective:   Vitals:   02/21/22 2007 02/21/22 2342 02/22/22 0358 02/22/22 0400  BP: (!) 140/107 (!) 151/104 (!) 145/96 (!) 140/83  Pulse: 93 86 71 71  Resp: 16 (!) 25 (!) 28 19  Temp: 98.7 F (37.1 C) 97.9 F (36.6 C) 98.1 F (36.7 C) 98.2 F (36.8 C)  TempSrc: Oral Oral Oral Oral  SpO2: 99% 93% 93% 90%  Weight:      Height:        Wt Readings from Last 3 Encounters:  02/09/22 120 kg  02/08/22 118.4 kg  01/31/22 118.4 kg     Intake/Output Summary (Last 24 hours) at 02/22/2022 1058 Last data filed at 02/21/2022 1927 Gross per 24 hour  Intake 3856.96 ml  Output 1200 ml  Net 2656.96 ml     Physical Exam Gen Exam:not in any distress HEENT:atraumatic, normocephalic Chest: B/L clear to auscultation anteriorly CVS:S1S2 regular Abdomen:soft non tender, non distended Extremities:no edema Skin: no rash     Data Review:    Recent Labs  Lab 02/18/22 0211 02/19/22 0734 02/20/22 0438 02/21/22 0245  WBC 10.8* 7.7 9.1 8.0  HGB 18.1* 18.2* 17.4* 17.3*  HCT 52.7* 54.2* 51.0 52.6*  PLT 157 127* 140* 143*  MCV 87.5 88.6 87.6 89.0  MCH 30.1 29.7 29.9 29.3  MCHC 34.3 33.6 34.1 32.9  RDW 13.5 13.6 13.6 13.7  Recent Labs  Lab 02/18/22 0211 02/19/22 0906 02/20/22 0438 02/21/22 0245  NA 133* 133* 132* 133*  K 4.1 4.2 4.0 3.9  CL 100 98 100 100  CO2 22 25 24 25   GLUCOSE 154* 136* 144* 121*  BUN 14 10 9 8   CREATININE 0.85 0.91 1.00 0.83  CALCIUM 8.3* 8.4* 8.5* 8.6*    ----------------------------------------------------------------------------------------------------------------- No results for  input(s): "CHOL", "HDL", "LDLCALC", "TRIG", "CHOLHDL", "LDLDIRECT" in the last 72 hours.   Lab Results  Component Value Date   HGBA1C 6.2 (H) 02/10/2022    No results for input(s): "TSH", "T4TOTAL", "T3FREE", "THYROIDAB" in the last 72 hours.  Invalid input(s): "FREET3"     Radiology Reports CT CHEST ABDOMEN PELVIS W CONTRAST  Result Date: 02/18/2022 CLINICAL DATA:  Rapid neurologic deterioration, possibly paraneoplastic. Evaluate for malignancy. EXAM: CT CHEST, ABDOMEN, AND PELVIS WITH CONTRAST TECHNIQUE: Multidetector CT imaging of the chest, abdomen and pelvis was performed following the standard protocol during bolus administration of intravenous contrast. RADIATION DOSE REDUCTION: This exam was performed according to the departmental dose-optimization program which includes automated exposure control, adjustment of the mA and/or kV according to patient size and/or use of iterative reconstruction technique. 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 Mitral valve calcifications. Heart is normal in size. No pericardial effusion. Mediastinum/Nodes: No enlarged mediastinal, hilar, or axillary lymph nodes. Thyroid gland, trachea, and esophagus demonstrate no significant findings. Lungs/Pleura: Lungs are clear. No pleural effusion or pneumothorax. Musculoskeletal: Postoperative changes of median sternotomy. No acute or suspicious osseous finding. CT ABDOMEN PELVIS FINDINGS Hepatobiliary: No focal liver abnormality is seen. No gallstones, gallbladder wall thickening, or biliary dilatation. Pancreas: Unremarkable. No pancreatic ductal dilatation or surrounding inflammatory changes. Spleen: Borderline splenomegaly, measuring 13. 8 cm in length (series 6, image 78). No focal abnormality of the spleen. Adrenals/Urinary Tract: A 1.5 cm  mass in the body of the left adrenal gland demonstrates attenuation of 46 Hounsfield units (series 3, image 67; series 6, image 65). Normal right adrenal gland. A 1.9 cm exophytic mass demonstrates attenuation of 45 Hounsfield units at the lower pole of the left kidney (series 3, image 85). An additional 1.0 cm exophytic mass at the lower pole of the left kidney demonstrates attenuation of 24 Hounsfield units, best appreciated on coronal series 6, image 63 (axial series 3, image 89). A 8.8 cm fluid attenuation exophytic cortical cyst is seen at the lower pole of the right kidney (series 3, image 89). No hydronephrosis bilaterally. Urinary bladder is unremarkable. Stomach/Bowel: Stomach is within normal limits. Appendix appears normal. No evidence of bowel wall thickening, distention, or inflammatory changes. Vascular/Lymphatic: No significant vascular findings are present. No enlarged abdominal or pelvic lymph nodes. Reproductive: Prostate is unremarkable. Other: Small fat containing left inguinal hernia. No abdominopelvic ascites. Musculoskeletal: No acute or suspicious osseous findings. IMPRESSION: 1. Indeterminate 1.5 cm mass in the body of the left adrenal gland. When the patient is clinically stable and able to follow directions and hold their breath (preferably as an outpatient) further evaluation with dedicated adrenal protocol MRI should be considered. 2. Two indeterminate left renal masses measuring up to 1.9 cm. When the patient is clinically stable and able to follow directions and hold their breath (preferably as an outpatient) further evaluation with dedicated abdominal MRI should be considered. 3. Borderline splenomegaly. 4. No CT findings suspicious for malignancy in the chest.  Electronically Signed   By: Ileana Roup M.D.   On: 02/18/2022 15:26      Signature  -   Oren Binet M.D on 02/22/2022 at 10:58 AM   -  To page go to [email or web address]

## 2022-02-22 NOTE — Progress Notes (Signed)
Manufacturing engineer [facility]) Hospital Liaison Note  Referral received for patient/family interest in [facility]. Chart under review by [facility] physician.   Hospice eligibility pending.   Please call with any questions or concerns. Thank you   Roselee Nova, [facility] Liaison  [phone]

## 2022-02-22 NOTE — Progress Notes (Signed)
Nutrition Follow-up  DOCUMENTATION CODES:   Not applicable  INTERVENTION:  - Add Ensure Enlive po BID, each supplement provides 350 kcal and 20 grams of protein.   NUTRITION DIAGNOSIS:   Increased nutrient needs related to chronic illness as evidenced by estimated needs.  GOAL:   Patient will meet greater than or equal to 90% of their needs  MONITOR:   PO intake, Labs, I & O's  REASON FOR ASSESSMENT:   Consult Assessment of nutrition requirement/status  ASSESSMENT:   56 y.o. male presented to the ED with diplopia, R hemianopsia, ataxia, aphasia, and dyscoordination. Pt evaluate at [facility] on 11/27 and in the ED on 11/28 with similar symptoms. PMH includes CHF, T2DM, and hepatic cirrhosis.Pt admitted with stroke like symptoms.  Meds reviewed: colace, sliding scale insulin, MVI, miralax. Labs reviewed: Na low.  Pt intakes have declined over the past several days. Palliative care is planning to meet with family today. In the meantime, RD will add Ensure BID for now and continue to monitor POC and PO intakes.   Diet Order:   Diet Order             Diet regular Room service appropriate? Yes with Assist; Fluid consistency: Thin  Diet effective now                   EDUCATION NEEDS:   Not appropriate for education at this time  Skin:  Skin Assessment: Reviewed RN Assessment  Last BM:  02/20/22  Height:   Ht Readings from Last 1 Encounters:  02/09/22 6' (1.829 m)    Weight:   Wt Readings from Last 1 Encounters:  02/09/22 120 kg    Ideal Body Weight:  80.9 kg  BMI:  Body mass index is 35.88 kg/m.  Estimated Nutritional Needs:   Kcal:  2200-2400  Protein:  110-130 grams  Fluid:  >/= 2 L  Thalia Bloodgood, RD, LDN, CNSC.

## 2022-02-22 NOTE — TOC Progression Note (Addendum)
Transition of Care [facility]) - Progression Note    Patient Details  Name: Reiss Mowrey MRN: [id number] Date of Birth: 22-Sep-1965  Transition of Care [facility]) CM/SW [place], LCSW Phone Number: 02/22/2022, 3:13 PM  Clinical Narrative:    CSW received consult for family's preference for [facility]. CSW sent referral for review. They do not have any beds available today.     Planned Disposition: Residential Hospice    Expected Discharge Plan and Services                                               Social Determinants of Health (SDOH) Interventions SDOH Screenings   Food Insecurity: No Food Insecurity (02/09/2022)  Housing: Low Risk  (02/09/2022)  Transportation Needs: No Transportation Needs (02/09/2022)  Utilities: Not At Risk (02/09/2022)  Depression (PHQ2-9): Low Risk  (11/16/2021)  Tobacco Use: Low Risk  (02/09/2022)    Readmission Risk Interventions    08/17/2019    4:35 PM  Readmission Risk Prevention Plan  Transportation Screening Complete  PCP or Specialist Appt within 5-7 Days Complete  Home Care Screening Complete  Medication Review (RN CM) Complete

## 2022-02-23 DIAGNOSIS — H5347 Heteronymous bilateral field defects: Secondary | ICD-10-CM | POA: Diagnosis not present

## 2022-02-23 MED ORDER — MORPHINE SULFATE (CONCENTRATE) 10 MG/0.5ML PO SOLN: 5.0000 mg | ORAL | 0 refills | Status: AC | PRN

## 2022-02-23 MED ORDER — LORAZEPAM 1 MG PO TABS: 1.0000 mg | ORAL_TABLET | ORAL | 0 refills | Status: AC | PRN

## 2022-02-23 NOTE — Progress Notes (Signed)
Report called to Environmental consultant at [facility]. Family and patient aware of transfer. Awaiting [facility] arrival for transportation.

## 2022-02-23 NOTE — Discharge Instructions (Signed)
1)As needed morphine sulfate and as needed lorazepam as needed for pain/comfort/anxiety/sleep 2)Transfer to residential hospice house at [facility] with full comfort care measures

## 2022-02-23 NOTE — Discharge Summary (Signed)
Steve Richards, is a 56 y.o. male  DOB 03-Jul-1965  MRN [id number].  Admission date:  02/08/2022  Admitting Physician  Karmen Bongo, MD  Discharge Date:  02/23/2022   Primary MD  Girtha Rm, NP-C  Recommendations for primary care physician for things to follow:  1)As needed morphine sulfate and as needed lorazepam as needed for pain/comfort/anxiety/sleep 2)Transfer to residential hospice house at [facility] with full comfort care measures  Admission Diagnosis  Ataxia [R27.0] Hemianopsia [H53.47]   Discharge Diagnosis  Ataxia [R27.0] Hemianopsia [H53.47]    Principal Problem:   Hemianopsia Active Problems:   Hepatic cirrhosis (HCC)   Class 2 obesity due to excess calories with body mass index (BMI) of 35.0 to 35.9 in adult   Controlled type 2 diabetes mellitus with complication, without long-term current use of insulin (HCC)   Stroke-like symptoms   SAH (subarachnoid hemorrhage) (HCC)   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[place])   End of life care   Dyspnea   Pain, generalized      Past Medical History:  Diagnosis Date   Ascites 07/15/2019   Cellulitis and abscess of left lower extremity 07/21/2019   Chronic combined systolic (congestive) and diastolic (congestive) heart failure (HCC)    Cirrhosis of liver with ascites ([place])    Hepatic cirrhosis ([place]) 07/15/2019   Hyperglycemia 07/21/2019   Left kidney mass 07/15/2019   Morbid obesity ([place])    Pleural effusion on right 07/15/2019   S/P aortic valve replacement with bioprosthetic valve 08/06/2019   Size 25 mm // Echocardiogram 8/21: EF 55-60, mod LVH, Gr 2 DD, normal RVSF, mod LAE, trivial MR, AVR w mean 13 mmHg and no PVL   Splenomegaly 00/34/9179   Systolic ejection murmur 15/07/6977    Past Surgical History:  Procedure Laterality Date   AORTIC VALVE REPLACEMENT N/A 08/06/2019   Procedure: AORTIC VALVE REPLACEMENT (AVR) using  Margaretha Sheffield Resilia 25 MM Aortic Valve.;  Surgeon: Gaye Pollack, MD;  Location: [facility] OR;  Service: Open Heart Surgery;  Laterality: N/A;   CARDIAC CATHETERIZATION     EXPLORATION POST OPERATIVE OPEN HEART N/A 08/06/2019   Procedure: EXPLORATION POST OPERATIVE OPEN HEART;  Surgeon: Gaye Pollack, MD;  Location: [place];  Service: Open Heart Surgery;  Laterality: N/A;   IR THORACENTESIS ASP PLEURAL SPACE W/IMG GUIDE  06/25/2019   RIGHT/LEFT HEART CATH AND CORONARY ANGIOGRAPHY N/A 07/28/2019   Procedure: RIGHT/LEFT HEART CATH AND CORONARY ANGIOGRAPHY;  Surgeon: Sherren Mocha, MD;  Location: [facility];  Service: Cardiovascular;  Laterality: N/A;   TEE WITHOUT CARDIOVERSION N/A 08/06/2019   Procedure: TRANSESOPHAGEAL ECHOCARDIOGRAM (TEE);  Surgeon: Gaye Pollack, MD;  Location: [place];  Service: Open Heart Surgery;  Laterality: N/A;   WISDOM TOOTH EXTRACTION     college age   WRIST FRACTURE SURGERY     grade school     HPI  from the history and physical done on the day of admission:    Chief Complaint: Neurologic symptoms   HPI: Steve Richards  is a 56 y.o. male with medical history significant of chronic combined CHF; DM; hypothyroidism; cirrhosis with ascites; and h/o AVR presenting with  progressive neurologic symptoms.  He was seen at [facility] for diplopia on 11/27 and he was referred to ophthalmology and neurology as well as his PCP.  He came to the ER with the same issue on 11/28 and had a negative MRI, was referred to ENT and ophthalmology.   He has continued to have diplopia with R hemianopsia as well as ataxia, aphasia, and dyscoordination.        ER Course:  Carryover, per Dr. Alcario Drought:   Cc: hx of right hemianopsia, ataxia, + romberg / subacute progressive signs and symptoms admit for expedited work up , was being evaluated as outpatient  Neuro rec admit for further evaluation of neuro disorder nos ? CJD      Review of Systems: As mentioned in the history of present illness. All  other systems reviewed and are negative.  Limited by aphasia.    Hospital Course:     Brief Narrative   56 y.o. male with PMH significant for hepatic cirrhosis, morbid obesity, aortic valve replacement with bioprosthetic valve who presented with aphasia/ataxia/hemianopsia-underwent extensive workup-including extensive neuroimaging/CSF analysis-unfortunately was diagnosed with CJD. 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 Rapidly deteriorating neurological status over the past several weeks. -Palliative care consult appreciated -Transfer to residential hospice given rapid deterioration of neurological status-poor oral intake over the past several days. transitioned to full comfort care/transfer to residential hospice at [place] of bioprosthetic AVR.   -  Stable prosthesis in aortic valve physiology on last echo few months ago- ---transitioned to full comfort care/transfer to residential hospice at [facility]     Chronic diastolic CHF EF of 71% on echo done few months ago.   Compensated. ----ttransitioned to full comfort care/transfer to residential hospice at [facility]      History of cirrhosis of liver.  Question NASH.   Stable INR and LFTs,  ----transitioned to full comfort care/transfer to residential hospice at [facility]     Polycythemia.   Likely due to OSA, monitor.   Small stable subarachnoid hemorrhage noted on MRI.   Symptom-free.  --transitioned to full comfort care/transfer to residential hospice at [facility]     Hypothyroidism.   Was on Synthroid PTA ----transitioned to full comfort care/transfer to residential hospice at [place]      DM type II.   ---transitioned to full comfort  care/transfer to residential hospice at [facility]     Discharge Condition: Overall prognosis is poor -transitioned to full comfort care/transfer to residential hospice at [facility]    Follow UP-----transitioned to full comfort care/transfer to residential hospice at [place] obtained -palliative care  Diet and Activity recommendation:  As advised  Discharge Instructions    Discharge Instructions     Call MD for:  difficulty breathing, headache or visual disturbances   Complete by: As directed    Call MD for:  persistant dizziness or light-headedness   Complete by: As directed    Call MD for:  persistant nausea and vomiting   Complete by: As directed    Call MD for:  severe uncontrolled pain   Complete  by: As directed    Call MD for:  temperature >100.4   Complete by: As directed    Diet general   Complete by: As directed    Discharge instructions   Complete by: As directed    1) as needed morphine sulfate and as needed lorazepam as needed for pain/comfort/anxiety/sleep 2) transfer to residential hospice house at [facility] with full comfort care measures   Increase activity slowly   Complete by: As directed        Discharge Medications     Allergies as of 02/23/2022   No Known Allergies      Medication List     STOP taking these medications    aspirin EC 81 MG tablet   levothyroxine 88 MCG tablet Commonly known as: SYNTHROID   metFORMIN 500 MG tablet Commonly known as: GLUCOPHAGE   multivitamin tablet       TAKE these medications    acetaminophen 325 MG tablet Commonly known as: TYLENOL Take 2 tablets (650 mg total) by mouth every 6 (six) hours as needed for mild pain.   LORazepam 1 MG tablet Commonly known as: ATIVAN Take 1 tablet (1 mg total) by mouth every 4 (four) hours as needed for anxiety or sleep.   morphine CONCENTRATE 10 MG/0.5ML Soln concentrated solution Place 0.25 mLs (5 mg total) under the tongue every  hour as needed for moderate pain or shortness of breath.       Major procedures and Radiology Reports - PLEASE review detailed and final reports for all details, in brief -   CT CHEST ABDOMEN PELVIS W CONTRAST  Result Date: 02/18/2022 CLINICAL DATA:  Rapid neurologic deterioration, possibly paraneoplastic. Evaluate for malignancy. EXAM: CT CHEST, ABDOMEN, AND PELVIS WITH CONTRAST TECHNIQUE: Multidetector CT imaging of the chest, abdomen and pelvis was performed following the standard protocol during bolus administration of intravenous contrast. RADIATION DOSE REDUCTION: This exam was performed according to the departmental dose-optimization program which includes automated exposure control, adjustment of the mA and/or kV according to patient size and/or use of iterative reconstruction technique. CONTRAST:  45m OMNIPAQUE IOHEXOL 350 MG/ML SOLN COMPARISON:  CT angio chest 07/25/2019 FINDINGS: CT CHEST FINDINGS Cardiovascular: Postoperative changes of coronary artery bypass graft and aortic valve replacement. Mitral valve calcifications. Heart is normal in size. No pericardial effusion. Mediastinum/Nodes: No enlarged mediastinal, hilar, or axillary lymph nodes. Thyroid gland, trachea, and esophagus demonstrate no significant findings. Lungs/Pleura: Lungs are clear. No pleural effusion or pneumothorax. Musculoskeletal: Postoperative changes of median sternotomy. No acute or suspicious osseous finding. CT ABDOMEN PELVIS FINDINGS Hepatobiliary: No focal liver abnormality is seen. No gallstones, gallbladder wall thickening, or biliary dilatation. Pancreas: Unremarkable. No pancreatic ductal dilatation or surrounding inflammatory changes. Spleen: Borderline splenomegaly, measuring 13. 8 cm in length (series 6, image 78). No focal abnormality of the spleen. Adrenals/Urinary Tract: A 1.5 cm mass in the body of the left adrenal gland demonstrates attenuation of 46 Hounsfield units (series 3, image 67; series 6,  image 65). Normal right adrenal gland. A 1.9 cm exophytic mass demonstrates attenuation of 45 Hounsfield units at the lower pole of the left kidney (series 3, image 85). An additional 1.0 cm exophytic mass at the lower pole of the left kidney demonstrates attenuation of 24 Hounsfield units, best appreciated on coronal series 6, image 63 (axial series 3, image 89). A 8.8 cm fluid attenuation exophytic cortical cyst is seen at the lower pole of the right kidney (series 3, image 89). No hydronephrosis bilaterally. Urinary bladder  is unremarkable. Stomach/Bowel: Stomach is within normal limits. Appendix appears normal. No evidence of bowel wall thickening, distention, or inflammatory changes. Vascular/Lymphatic: No significant vascular findings are present. No enlarged abdominal or pelvic lymph nodes. Reproductive: Prostate is unremarkable. Other: Small fat containing left inguinal hernia. No abdominopelvic ascites. Musculoskeletal: No acute or suspicious osseous findings. IMPRESSION: 1. Indeterminate 1.5 cm mass in the body of the left adrenal gland. When the patient is clinically stable and able to follow directions and hold their breath (preferably as an outpatient) further evaluation with dedicated adrenal protocol MRI should be considered. 2. Two indeterminate left renal masses measuring up to 1.9 cm. When the patient is clinically stable and able to follow directions and hold their breath (preferably as an outpatient) further evaluation with dedicated abdominal MRI should be considered. 3. Borderline splenomegaly. 4. No CT findings suspicious for malignancy in the chest. Electronically Signed   By: Ileana Roup M.D.   On: 02/18/2022 15:26   MR BRAIN W WO CONTRAST  Result Date: 02/15/2022 CLINICAL DATA:  Neuro deficit, neurodegenerative disorder suspected. Possible CJD versus autoimmune encephalopathy EXAM: MRI HEAD WITHOUT AND WITH CONTRAST TECHNIQUE: Multiplanar, multiecho pulse sequences of the brain and  surrounding structures were obtained without and with intravenous contrast. CONTRAST:  53m GADAVIST GADOBUTROL 1 MMOL/ML IV SOLN COMPARISON:  MRI head 02/09/2022, MRI 01/30/2022 FINDINGS: Brain: Again noted is subtle diffusion abnormality mostly in the left parietal lobe with cortical hyperintensity on DWI which shows restricted diffusion on ADC map. This may have progressed from the prior studies. This is asymmetric. There may be some early similar findings in the right parietal lobe. Enhancement in this area is normal. No underlying mass or infarct. Normal diffusion-weighted imaging in the brainstem and basal ganglia. Improvement in subarachnoid hemorrhage over the right frontal convexity, presumably posttraumatic. Scattered areas of chronic microhemorrhage in both cerebral hemispheres stable. This is mild. No fluid collection. Ventricle size normal. No midline shift. Vascular: Normal arterial flow voids Skull and upper cervical spine: No focal skeletal lesion. Sinuses/Orbits: Mild mucosal edema paranasal sinuses. Negative orbit Other: None IMPRESSION: 1. Subtle diffusion abnormality mostly in the left parietal lobe. This may have progressed from the prior studies. There may be some early similar findings in the right parietal lobe. No underlying mass or infarct. Differential diagnosis includes DJD but nonspecific. Correlate with lumbar puncture. 2. Improvement in subarachnoid hemorrhage over the right frontal convexity. 3. Scattered areas of chronic microhemorrhage in both cerebral hemispheres stable from the prior study. This may be due to chronic hypertension. Electronically Signed   By: CFranchot GalloM.D.   On: 02/15/2022 15:23   CT ANGIO HEAD W OR WO CONTRAST  Result Date: 02/14/2022 CLINICAL DATA:  Subarachnoid hemorrhage EXAM: CT ANGIOGRAPHY HEAD TECHNIQUE: Multidetector CT imaging of the head was performed using the standard protocol during bolus administration of intravenous contrast. Multiplanar  CT image reconstructions and MIPs were obtained to evaluate the vascular anatomy. RADIATION DOSE REDUCTION: This exam was performed according to the departmental dose-optimization program which includes automated exposure control, adjustment of the mA and/or kV according to patient size and/or use of iterative reconstruction technique. CONTRAST:  760mOMNIPAQUE IOHEXOL 350 MG/ML SOLN COMPARISON:  None Available. FINDINGS: CT HEAD Brain: There is no mass, hemorrhage or extra-axial collection. The size and configuration of the ventricles and extra-axial CSF spaces are normal. The brain parenchyma is normal, without acute or chronic infarction. Previously seen superior right parafalcine hyperdensity is no longer visible. Vascular: No abnormal hyperdensity of  the major intracranial arteries or dural venous sinuses. No intracranial atherosclerosis. Skull: The visualized skull base, calvarium and extracranial soft tissues are normal. Sinuses/Orbits: No fluid levels or advanced mucosal thickening of the visualized paranasal sinuses. No mastoid or middle ear effusion. The orbits are normal. CTA HEAD POSTERIOR CIRCULATION: --Vertebral arteries: Normal --Inferior cerebellar arteries: Normal. --Basilar artery: Normal. --Superior cerebellar arteries: Normal. --Posterior cerebral arteries: Normal. ANTERIOR CIRCULATION: --Intracranial internal carotid arteries: Normal. --Anterior cerebral arteries (ACA): Normal. --Middle cerebral arteries (MCA): Normal. ANATOMIC VARIANTS: None Review of the MIP images confirms the above findings. IMPRESSION: Normal CTA of the head.  No acute subarachnoid hemorrhage. Electronically Signed   By: Ulyses Jarred M.D.   On: 02/14/2022 21:27   CT HEAD WO CONTRAST (5MM)  Result Date: 02/09/2022 CLINICAL DATA:  Follow-up subarachnoid hemorrhage EXAM: CT HEAD WITHOUT CONTRAST TECHNIQUE: Contiguous axial images were obtained from the base of the skull through the vertex without intravenous contrast.  RADIATION DOSE REDUCTION: This exam was performed according to the departmental dose-optimization program which includes automated exposure control, adjustment of the mA and/or kV according to patient size and/or use of iterative reconstruction technique. COMPARISON:  CT 02/08/2022 and MR brain 02/09/2022 FINDINGS: Brain: The small focus along the right superior frontal gyrus of suspected subarachnoid hemorrhage appears unchanged from the CT dated 02/08/2022. This measures approximately 1.1 cm on sagittal image 28/6. Previously 1.3 cm. No new areas of hemorrhage identified. No signs of acute stroke. Sulci and ventricular volumes appear normal. Vascular: No hyperdense vessel or unexpected calcification. Skull: Normal. Negative for fracture or focal lesion. Sinuses/Orbits: Chronic left maxillary and ethmoid mucosal thickening. Mastoid air cells are clear. Other: None. IMPRESSION: 1. Stable appearance of small focus of suspected subarachnoid hemorrhage along the right superior frontal gyrus. 2. No new areas of hemorrhage identified. 3. Chronic left maxillary and ethmoid sinus disease. Electronically Signed   By: Kerby Moors M.D.   On: 02/09/2022 18:42   EEG adult  Result Date: 02/09/2022 Landry Corporal, MD     02/09/2022 11:40 AM [facility] Routine EEG Report Video Performed: Performed Demographics: Patient Name:   Thayne, Cindric Date of Birth:   [birth year] Identification Number:   MRN - [id number] Study Times: Study Start Time:   02/09/2022 10:18:53 Study End Time:   02/09/2022 11:09:09 Duration:   50 minutes Indication(s): Spells, Eval for Seizures Technical Summary: This EEG was performed utilizing standard International 10-20 System of electrode placement. Data were obtained and interpreted utilizing referential montage recording, with reformatting to longitudinal, transverse bipolar, and referential montages as necessary for interpretation. State(s):       Drowsy      Asleep Activation Procedures: Hyperventilation: Not performed Photic Stimulation: Not performed EEG Description: Awake background of 6 to 7 Hz is occasionally present. Intermittent generalized theta-delta slowing was present. Excess continuous generalized theta-delta slowing was present. No normal sleep architecture is noted. Muscle, motion, electrode, and eye movement artifacts were occasionally noted. Prominent salt bridging versus sweat artifact is noted. Impression: Abnormal EEG due to:   1) Theta range background slowing  2) Intermittent generalized theta-delta slowing  3) Continuous generalized theta-delta slowing Clinical Correlation: This EEG is suggestive of a mild-moderate encephalopathy, but is nonspecific as to etiology. The absence of epileptiform abnormalities does not preclude a clinical diagnosis of seizures. [facility] For Inpatient follow-up with [facility] physician please call [facility] [phone]. This is not an outpatient service. Post hospital discharge, please contact hospital directly.   MR BRAIN W WO CONTRAST  Result Date: 02/09/2022 CLINICAL DATA:  57 year old male with progressive neurologic deficits since last month. Aphasia, abnormal gait, right arm tremor, now with right lower extremity tremor and progressive. And progressive right greater than left hemianopsia. Neurology considering autoimmune versus paraneoplastic etiology versus CJD. History of cirrhosis. EXAM: MRI HEAD WITHOUT AND WITH CONTRAST TECHNIQUE: Multiplanar, multiecho pulse sequences of the brain and surrounding structures were obtained without and with intravenous contrast. CONTRAST:  66m GADAVIST GADOBUTROL 1 MMOL/ML IV SOLN COMPARISON:  Head CT yesterday. Brain MRI without and with contrast 01/30/2022. FINDINGS: Brain: Cerebral volume appears stable and within normal limits for age. But along the right superior frontal gyrus there is a seemingly small new nodular roughly 14 mm extra-axial  lesion with FLAIR hyperintensity (series 11, image 24), partial intrinsic T1 hyperintensity (series 16, image 49) and questionable enhancement (series 19, images 19 and 20) which is subtle on T2 and diffusion but was not clearly visible on MRI last month. This has a nonspecific intermediate density appearance on MRI this morning, but demonstrates pronounced susceptibility on SWI (series 14, image 49) and is therefore suspicious for trace subarachnoid hemorrhage. Adjacent superior sagittal sinus seems unremarkable. Punctate chronic microhemorrhage in the left frontal lobe white matter and right parietal lobe on series 14, image 35 are stable from last month but more conspicuous than on the T2* imaging at that time. And there appear to be 2 additional punctate chronic microhemorrhages in the frontal lobe white matter also on image 35. But no other chronic cerebral blood products. On this 1.5 Tesla exam today there is suspicious asymmetric cortical increased diffusion affecting multiple sites in the left hemisphere, 1 of the most conspicuous areas is the left parietal lobe (series 5, image 87). This might have been present last month, but as on that exam there is no convincing T2 or FLAIR cortex correlation, no convincing gyral edema. Similar subtle asymmetry present on the coronal DWI. No abnormal enhancement identified. No dural thickening. Background gray and white matter signal remains normal otherwise. No restricted diffusion suggestive of acute infarction. No midline shift, mass effect. No ventriculomegaly. No ventricular debris. Cervicomedullary junction and pituitary are within normal limits. No intrinsic T1 hyperintensity in the deep gray nuclei. Vascular: Major intracranial vascular flow voids are stable, preserved. The major dural venous sinuses are enhancing and appear to be patent. Skull and upper cervical spine: Negative. Visualized bone marrow signal is within normal limits. Sinuses/Orbits: Rightward  gaze. Normal suprasellar cistern and unremarkable optic chiasm. Chronic appearing left maxillary wall deformity. Left maxillary and ethmoid mucosal thickening. Other: Mastoids remain clear. Visible internal auditory structures appear normal. IMPRESSION: 1. Small 14 mm focus suspicious for subarachnoid hemorrhage at the right vertex adjacent to the falx. This was subtle on the earlier CT, not apparent on MRI last month. Etiology and significance is unclear, has there been recent fall? No other acute intracranial hemorrhage is identified, but there is a small number of nonspecific chronic microhemorrhages in both hemispheres. 2. Otherwise a largely unremarkable MRI appearance of the brain, HOWEVER, suspicion of subtle abnormal cortical diffusion widely scattered in the left hemisphere. Although with no cortical edema or abnormal signal on the remaining sequences. No enhancement. Given the clinical suspicion Creutzfeldt-Jakob Disease cannot be excluded. Differential diagnosis would include autoimmune or paraneoplastic encephalitis - although absence of T2/FLAIR abnormality argues against those. Study discussed by telephone with Dr. SDonnetta Simperson 02/09/2022 at 06:48. Electronically Signed   By: HGenevie AnnM.D.   On: 02/09/2022 06:49  CT HEAD WO CONTRAST  Result Date: 02/08/2022 CLINICAL DATA:  Neuro deficit, acute, stroke suspected. Expressive aphasia, abnormal gait. EXAM: CT HEAD WITHOUT CONTRAST TECHNIQUE: Contiguous axial images were obtained from the base of the skull through the vertex without intravenous contrast. RADIATION DOSE REDUCTION: This exam was performed according to the departmental dose-optimization program which includes automated exposure control, adjustment of the mA and/or kV according to patient size and/or use of iterative reconstruction technique. COMPARISON:  MRI 01/30/2022 FINDINGS: Brain: Normal anatomic configuration. No abnormal intra or extra-axial mass lesion or fluid collection.  No abnormal mass effect or midline shift. No evidence of acute intracranial hemorrhage or infarct. Ventricular size is normal. Cerebellum unremarkable. Vascular: Unremarkable Skull: Intact Sinuses/Orbits: Air-fluid level within the left maxillary sinus has resolved though mild mucosal thickening persists. Dense opacification of the left frontal sinus and several left ethmoid air cells persists, however. Remaining paranasal sinuses are clear. Orbits are unremarkable. Other: Mastoid air cells and middle ear cavities are clear. IMPRESSION: 1. No acute intracranial abnormality. No calvarial fracture. 2. Improved left maxillary sinus disease. Persistent left frontal and ethmoid paranasal sinus disease. Electronically Signed   By: Fidela Salisbury M.D.   On: 02/08/2022 19:30   MR BRAIN W WO CONTRAST  Addendum Date: 01/31/2022   ADDENDUM REPORT: 01/31/2022 11:56 ADDENDUM: Dr. Lucita Ferrara called to discuss this case. The patient has a developing right hemianopsia. Review of the study does not change the interpretation. There is no MR evidence of acute infarction at the time this study. Electronically Signed   By: Nelson Chimes M.D.   On: 01/31/2022 11:56   Result Date: 01/31/2022 CLINICAL DATA:  Acute presentation with blurred vision. EXAM: MRI HEAD WITHOUT AND WITH CONTRAST TECHNIQUE: Multiplanar, multiecho pulse sequences of the brain and surrounding structures were obtained without and with intravenous contrast. CONTRAST:  53m GADAVIST GADOBUTROL 1 MMOL/ML IV SOLN COMPARISON:  None FINDINGS: Brain: The brain has a normal appearance without evidence of malformation, atrophy, old or acute small or large vessel infarction, mass lesion, hemorrhage, hydrocephalus or extra-axial collection. Cavernous sinus regions appear normal. No gross dysconjugate gaze. No suprasellar lesion. Mild arachnoid herniation into the sella. No abnormal contrast enhancement occurs. Vascular: Major vessels at the base of the brain show flow.  Venous sinuses appear patent. Skull and upper cervical spine: Normal. Sinuses/Orbits: Mucosal inflammatory changes of the left maxillary sinus, left anterior ethmoid sinuses and left frontal sinus, consistent with ostiomeatal unit disease. Orbits themselves appear normal. Other: None significant. IMPRESSION: 1. Normal appearance of the brain itself. No abnormality seen to explain the presenting symptoms. Orbits appear normal as seen. 2. Left ostiomeatal unit pattern of sinusitis. Electronically Signed: By: MNelson ChimesM.D. On: 01/30/2022 14:37    Today   Subjective    [place] has no new concerns Remains unchanged-sleepy-drowsy-continues to have intermittent myoclonus  -Patient's sister and another male caregiver at bedside, questions answered -transitioned to full comfort care/[place]    Patient has been seen and examined prior to discharge   Objective   Blood pressure (!) 135/107, pulse 87, temperature 99.6 F (37.6 C), temperature source Axillary, resp. rate (!) 22, height 6' (1.829 m), weight 120 kg, SpO2 97 %.  No intake or output data in the 24 hours ending 02/23/22 1337  Exam Gen:- Awake Alert, no acute distress  HEENT:- [place].AT, No sclera icterus Neck-Supple Neck,No JVD,.  Lungs-  CTAB , good air movement bilaterally CV- S1, S2 normal, regular  Abd-  +ve B.Sounds, Abd Soft, No tenderness,    Extremity/Skin:- No  edema,   good pulses Psych-affect is flat,, Neuro-neuro muscular deficits consistent with CJD, clonus   Data Review   CBC w Diff:  Lab Results  Component Value Date   WBC 8.0 02/21/2022   HGB 17.3 (H) 02/21/2022   HGB 17.4 04/18/2020   HCT 52.6 (H) 02/21/2022   HCT 51.9 (H) 04/18/2020   PLT 143 (L) 02/21/2022   LYMPHOPCT 4 02/15/2022   MONOPCT 3 02/15/2022   EOSPCT 0 02/15/2022   BASOPCT 0 02/15/2022    CMP:  Lab Results  Component Value Date   NA 133 (L) 02/21/2022   NA 140 04/18/2020   K 3.9  02/21/2022   CL 100 02/21/2022   CO2 25 02/21/2022   BUN 8 02/21/2022   BUN 19 04/18/2020   CREATININE 0.83 02/21/2022   PROT 6.9 02/15/2022   PROT 7.4 04/18/2020   ALBUMIN 3.9 02/15/2022   ALBUMIN 4.3 02/09/2022   BILITOT 0.7 02/15/2022   BILITOT 0.6 04/18/2020   ALKPHOS 49 02/15/2022   AST 42 (H) 02/15/2022   ALT 42 02/15/2022  .  Total Discharge time is about 33 minutes  Roxan Hockey M.D on 02/23/2022 at 1:37 PM  Go to [email or web address] -  for contact info  [facility] - Office  [phone]

## 2022-02-23 NOTE — TOC Transition Note (Signed)
Transition of Care [facility]) - CM/SW Discharge Note   Patient Details  Name: Steve Richards MRN: [id number] Date of Birth: 02-Aug-1965  Transition of Care [facility]) CM/SW Contact:  Benard Halsted, LCSW Phone Number: 02/23/2022, 2:27 PM   Clinical Narrative:    Patient will DC to: [facility] Anticipated DC date: 02/23/22 Family notified: Sister at bedside Transport by: Corey Harold   Per MD patient ready for DC to [facility]. RN to call report prior to discharge ([phone]). RN, patient, patient's family, and facility notified of DC. Discharge Summary sent to facility. DC packet on chart including signed DNR. Ambulance transport requested for patient.   CSW will sign off for now as social work intervention is no longer needed. Please consult [place] again if new needs arise.     Final next level of care: [place] Barriers to Discharge: Barriers Resolved   Patient Goals and CMS Choice CMS Medicare.gov Compare Post Acute Care list provided to:: Patient Represenative (must comment) Choice offered to / list presented to : Sibling  Discharge Placement                Patient chooses bed at:  [facility]) Patient to be transferred to facility by: [place] Name of family member notified: Sister Patient and family notified of of transfer: 02/23/22  Discharge Plan and Services Additional resources added to the After Visit Summary for   In-house Referral: Clinical Social Work, Hospice / [place] Acute Care Choice: Hospice                               Social Determinants of Health (SDOH) Interventions [place]: No Food Insecurity (02/09/2022)  Housing: Low Risk  (02/09/2022)  Transportation Needs: No Transportation Needs (02/09/2022)  Utilities: Not At Risk (02/09/2022)  Depression (PHQ2-9): Low Risk  (11/16/2021)  Tobacco Use: Low Risk  (02/09/2022)     Readmission Risk Interventions    08/17/2019    4:35 PM   Readmission Risk Prevention Plan  Transportation Screening Complete  PCP or Specialist Appt within 5-7 Days Complete  Home Care Screening Complete  Medication Review (RN CM) Complete

## 2022-02-27 ENCOUNTER — Telehealth: Payer: Self-pay | Admitting: Family Medicine

## 2022-02-27 NOTE — Telephone Encounter (Signed)
Transition Care Management Unsuccessful Follow-up Telephone Call  Date of discharge and from where:  [facility] 02/10/22  Attempts:  1st Attempt  Reason for unsuccessful TCM follow-up call:  Left voice message

## 2022-02-28 LAB — MISC LABCORP TEST (SEND OUT)

## 2022-03-01 ENCOUNTER — Telehealth: Payer: Self-pay

## 2022-03-01 NOTE — Telephone Encounter (Signed)
error 

## 2022-03-05 DEATH — deceased

## 2022-03-08 ENCOUNTER — Encounter: Payer: Commercial Managed Care - HMO | Admitting: Occupational Therapy

## 2022-03-15 ENCOUNTER — Encounter: Payer: Commercial Managed Care - HMO | Admitting: Occupational Therapy

## 2022-03-22 ENCOUNTER — Ambulatory Visit: Payer: Commercial Managed Care - HMO | Admitting: Family Medicine

## 2022-04-04 LAB — MISC LABCORP TEST (SEND OUT)
LabCorp test name: 14
Labcorp test code: 9985
